# Patient Record
Sex: Female | Born: 1950 | Race: White | Hispanic: No | Marital: Married | Smoking: Never smoker
Health system: Southern US, Community
[De-identification: ages and names within clinical notes are randomized; demographics above are authoritative.]

## PROBLEM LIST (undated history)

## (undated) DIAGNOSIS — Z8042 Family history of malignant neoplasm of prostate: Secondary | ICD-10-CM

## (undated) DIAGNOSIS — J8482 Adult pulmonary Langerhans cell histiocytosis: Secondary | ICD-10-CM

## (undated) DIAGNOSIS — Z803 Family history of malignant neoplasm of breast: Secondary | ICD-10-CM

## (undated) DIAGNOSIS — E039 Hypothyroidism, unspecified: Secondary | ICD-10-CM

## (undated) DIAGNOSIS — I5189 Other ill-defined heart diseases: Secondary | ICD-10-CM

## (undated) DIAGNOSIS — Z982 Presence of cerebrospinal fluid drainage device: Secondary | ICD-10-CM

## (undated) DIAGNOSIS — C519 Malignant neoplasm of vulva, unspecified: Secondary | ICD-10-CM

## (undated) DIAGNOSIS — J309 Allergic rhinitis, unspecified: Secondary | ICD-10-CM

## (undated) DIAGNOSIS — I10 Essential (primary) hypertension: Secondary | ICD-10-CM

## (undated) DIAGNOSIS — K209 Esophagitis, unspecified without bleeding: Secondary | ICD-10-CM

## (undated) DIAGNOSIS — C4499 Other specified malignant neoplasm of skin, unspecified: Secondary | ICD-10-CM

## (undated) DIAGNOSIS — K649 Unspecified hemorrhoids: Secondary | ICD-10-CM

## (undated) DIAGNOSIS — D649 Anemia, unspecified: Secondary | ICD-10-CM

## (undated) DIAGNOSIS — T451X5A Adverse effect of antineoplastic and immunosuppressive drugs, initial encounter: Secondary | ICD-10-CM

## (undated) DIAGNOSIS — Z79811 Long term (current) use of aromatase inhibitors: Secondary | ICD-10-CM

## (undated) DIAGNOSIS — R7303 Prediabetes: Secondary | ICD-10-CM

## (undated) DIAGNOSIS — Z9484 Stem cells transplant status: Secondary | ICD-10-CM

## (undated) DIAGNOSIS — Z808 Family history of malignant neoplasm of other organs or systems: Secondary | ICD-10-CM

## (undated) DIAGNOSIS — M1990 Unspecified osteoarthritis, unspecified site: Secondary | ICD-10-CM

## (undated) DIAGNOSIS — D126 Benign neoplasm of colon, unspecified: Secondary | ICD-10-CM

## (undated) DIAGNOSIS — I509 Heart failure, unspecified: Secondary | ICD-10-CM

## (undated) DIAGNOSIS — L309 Dermatitis, unspecified: Secondary | ICD-10-CM

## (undated) DIAGNOSIS — G459 Transient cerebral ischemic attack, unspecified: Secondary | ICD-10-CM

## (undated) DIAGNOSIS — R55 Syncope and collapse: Secondary | ICD-10-CM

## (undated) DIAGNOSIS — T8859XA Other complications of anesthesia, initial encounter: Secondary | ICD-10-CM

## (undated) DIAGNOSIS — I639 Cerebral infarction, unspecified: Secondary | ICD-10-CM

## (undated) DIAGNOSIS — D6481 Anemia due to antineoplastic chemotherapy: Secondary | ICD-10-CM

## (undated) DIAGNOSIS — Z9289 Personal history of other medical treatment: Secondary | ICD-10-CM

## (undated) DIAGNOSIS — C8330 Diffuse large B-cell lymphoma, unspecified site: Secondary | ICD-10-CM

## (undated) DIAGNOSIS — K5790 Diverticulosis of intestine, part unspecified, without perforation or abscess without bleeding: Secondary | ICD-10-CM

## (undated) DIAGNOSIS — Z923 Personal history of irradiation: Secondary | ICD-10-CM

## (undated) DIAGNOSIS — Z9221 Personal history of antineoplastic chemotherapy: Secondary | ICD-10-CM

## (undated) DIAGNOSIS — K219 Gastro-esophageal reflux disease without esophagitis: Secondary | ICD-10-CM

## (undated) DIAGNOSIS — E7800 Pure hypercholesterolemia, unspecified: Secondary | ICD-10-CM

## (undated) HISTORY — DX: Unspecified hemorrhoids: K64.9

## (undated) HISTORY — DX: Diverticulosis of intestine, part unspecified, without perforation or abscess without bleeding: K57.90

## (undated) HISTORY — DX: Personal history of other medical treatment: Z92.89

## (undated) HISTORY — PX: VULVECTOMY: SHX1086

## (undated) HISTORY — PX: LIVER BIOPSY: SHX301

## (undated) HISTORY — DX: Family history of malignant neoplasm of breast: Z80.3

## (undated) HISTORY — DX: Personal history of antineoplastic chemotherapy: Z92.21

## (undated) HISTORY — DX: Family history of malignant neoplasm of other organs or systems: Z80.8

## (undated) HISTORY — DX: Family history of malignant neoplasm of prostate: Z80.42

## (undated) HISTORY — DX: Stem cells transplant status: Z94.84

## (undated) HISTORY — DX: Essential (primary) hypertension: I10

## (undated) HISTORY — DX: Esophagitis, unspecified: K20.9

## (undated) HISTORY — DX: Adult pulmonary Langerhans cell histiocytosis: J84.82

## (undated) HISTORY — DX: Unspecified osteoarthritis, unspecified site: M19.90

## (undated) HISTORY — DX: Pure hypercholesterolemia, unspecified: E78.00

## (undated) HISTORY — PX: BURR HOLE W/ PLACEMENT OMMAYA RESERVOIR: SHX1277

## (undated) HISTORY — DX: Other specified malignant neoplasm of skin, unspecified: C44.99

## (undated) HISTORY — DX: Hypothyroidism, unspecified: E03.9

## (undated) HISTORY — DX: Allergic rhinitis, unspecified: J30.9

## (undated) HISTORY — DX: Other ill-defined heart diseases: I51.89

## (undated) HISTORY — DX: Transient cerebral ischemic attack, unspecified: G45.9

## (undated) HISTORY — DX: Morbid (severe) obesity due to excess calories: E66.01

## (undated) HISTORY — DX: Syncope and collapse: R55

## (undated) HISTORY — DX: Diffuse large B-cell lymphoma, unspecified site: C83.30

## (undated) HISTORY — DX: Esophagitis, unspecified without bleeding: K20.90

## (undated) HISTORY — DX: Malignant neoplasm of vulva, unspecified: C51.9

---
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
## 1983-12-05 HISTORY — PX: ABDOMINAL HYSTERECTOMY: SHX81

## 2000-12-04 HISTORY — PX: BREAST BIOPSY: SHX20

## 2004-03-25 ENCOUNTER — Other Ambulatory Visit: Payer: Self-pay

## 2008-12-22 ENCOUNTER — Ambulatory Visit: Payer: Self-pay | Admitting: Gastroenterology

## 2009-01-29 ENCOUNTER — Ambulatory Visit: Payer: Self-pay | Admitting: Internal Medicine

## 2009-05-21 LAB — HM COLONOSCOPY

## 2009-12-04 HISTORY — PX: INSERTION CENTRAL VENOUS ACCESS DEVICE W/ SUBCUTANEOUS PORT: SUR725

## 2009-12-04 LAB — HM PAP SMEAR

## 2010-05-19 DIAGNOSIS — Z9289 Personal history of other medical treatment: Secondary | ICD-10-CM

## 2010-05-19 HISTORY — DX: Personal history of other medical treatment: Z92.89

## 2010-10-04 ENCOUNTER — Ambulatory Visit: Payer: Self-pay | Admitting: Oncology

## 2010-10-04 DIAGNOSIS — C8330 Diffuse large B-cell lymphoma, unspecified site: Secondary | ICD-10-CM

## 2010-10-04 HISTORY — DX: Diffuse large B-cell lymphoma, unspecified site: C83.30

## 2010-10-13 ENCOUNTER — Inpatient Hospital Stay: Payer: Self-pay | Admitting: Internal Medicine

## 2010-10-22 DIAGNOSIS — Z9221 Personal history of antineoplastic chemotherapy: Secondary | ICD-10-CM

## 2010-10-22 HISTORY — DX: Personal history of antineoplastic chemotherapy: Z92.21

## 2010-10-25 LAB — PATHOLOGY REPORT

## 2010-11-03 ENCOUNTER — Ambulatory Visit: Payer: Self-pay | Admitting: Oncology

## 2010-11-08 ENCOUNTER — Ambulatory Visit: Payer: Self-pay | Admitting: Internal Medicine

## 2010-11-24 ENCOUNTER — Inpatient Hospital Stay: Payer: Self-pay | Admitting: Internal Medicine

## 2010-12-04 ENCOUNTER — Ambulatory Visit: Payer: Self-pay | Admitting: Internal Medicine

## 2010-12-04 ENCOUNTER — Ambulatory Visit: Payer: Self-pay | Admitting: Oncology

## 2010-12-04 DIAGNOSIS — Z982 Presence of cerebrospinal fluid drainage device: Secondary | ICD-10-CM

## 2010-12-04 HISTORY — PX: LIMBAL STEM CELL TRANSPLANT: SHX1969

## 2010-12-04 HISTORY — DX: Presence of cerebrospinal fluid drainage device: Z98.2

## 2010-12-04 HISTORY — PX: BURR HOLE W/ PLACEMENT OMMAYA RESERVOIR: SHX1277

## 2010-12-22 ENCOUNTER — Observation Stay: Payer: Self-pay | Admitting: Internal Medicine

## 2011-01-01 ENCOUNTER — Observation Stay: Payer: Self-pay | Admitting: Internal Medicine

## 2011-01-04 ENCOUNTER — Ambulatory Visit: Payer: Self-pay | Admitting: Oncology

## 2011-01-04 ENCOUNTER — Ambulatory Visit: Payer: Self-pay | Admitting: Internal Medicine

## 2011-01-20 ENCOUNTER — Inpatient Hospital Stay: Payer: Self-pay | Admitting: Internal Medicine

## 2011-02-01 ENCOUNTER — Ambulatory Visit: Payer: Self-pay | Admitting: Vascular Surgery

## 2011-02-02 ENCOUNTER — Ambulatory Visit: Payer: Self-pay | Admitting: Oncology

## 2011-02-02 ENCOUNTER — Ambulatory Visit: Payer: Self-pay | Admitting: Internal Medicine

## 2011-02-17 ENCOUNTER — Other Ambulatory Visit: Payer: Self-pay | Admitting: Internal Medicine

## 2011-03-03 ENCOUNTER — Inpatient Hospital Stay: Payer: Self-pay | Admitting: Internal Medicine

## 2011-03-05 ENCOUNTER — Ambulatory Visit: Payer: Self-pay | Admitting: Internal Medicine

## 2011-03-05 ENCOUNTER — Ambulatory Visit: Payer: Self-pay | Admitting: Oncology

## 2011-04-04 ENCOUNTER — Ambulatory Visit: Payer: Self-pay | Admitting: Internal Medicine

## 2011-04-04 ENCOUNTER — Ambulatory Visit: Payer: Self-pay | Admitting: Oncology

## 2011-05-05 ENCOUNTER — Ambulatory Visit: Payer: Self-pay | Admitting: Internal Medicine

## 2011-05-05 ENCOUNTER — Ambulatory Visit: Payer: Self-pay | Admitting: Oncology

## 2011-06-04 DIAGNOSIS — Z9484 Stem cells transplant status: Secondary | ICD-10-CM

## 2011-06-04 HISTORY — DX: Stem cells transplant status: Z94.84

## 2011-06-08 ENCOUNTER — Ambulatory Visit: Payer: Self-pay | Admitting: Internal Medicine

## 2011-06-26 LAB — PULMONARY FUNCTION TEST

## 2011-07-05 ENCOUNTER — Ambulatory Visit: Payer: Self-pay | Admitting: Internal Medicine

## 2011-08-05 ENCOUNTER — Ambulatory Visit: Payer: Self-pay | Admitting: Internal Medicine

## 2011-08-10 ENCOUNTER — Inpatient Hospital Stay: Payer: Self-pay | Admitting: Internal Medicine

## 2011-09-04 ENCOUNTER — Ambulatory Visit: Payer: Self-pay | Admitting: Internal Medicine

## 2011-10-05 ENCOUNTER — Ambulatory Visit: Payer: Self-pay | Admitting: Internal Medicine

## 2011-11-04 ENCOUNTER — Ambulatory Visit: Payer: Self-pay | Admitting: Internal Medicine

## 2011-12-05 ENCOUNTER — Ambulatory Visit: Payer: Self-pay | Admitting: Internal Medicine

## 2011-12-13 ENCOUNTER — Ambulatory Visit: Payer: Self-pay | Admitting: Internal Medicine

## 2011-12-13 LAB — HEPATIC FUNCTION PANEL A (ARMC)
Bilirubin, Direct: 0.1 mg/dL (ref 0.00–0.20)
Bilirubin,Total: 0.3 mg/dL (ref 0.2–1.0)
SGOT(AST): 23 U/L (ref 15–37)
Total Protein: 6.4 g/dL (ref 6.4–8.2)

## 2011-12-27 LAB — CBC CANCER CENTER
Basophil #: 0 x10 3/mm (ref 0.0–0.1)
Basophil %: 0.3 %
HGB: 13.2 g/dL (ref 12.0–16.0)
Lymphocyte #: 0.8 x10 3/mm — ABNORMAL LOW (ref 1.0–3.6)
Lymphocyte %: 24.1 %
MCHC: 34.6 g/dL (ref 32.0–36.0)
MCV: 96 fL (ref 80–100)
Monocyte %: 5.1 %
Neutrophil %: 69.9 %
RBC: 3.97 10*6/uL (ref 3.80–5.20)
RDW: 13.9 % (ref 11.5–14.5)
WBC: 3.4 x10 3/mm — ABNORMAL LOW (ref 3.6–11.0)

## 2011-12-27 LAB — LACTATE DEHYDROGENASE: LDH: 199 U/L (ref 84–246)

## 2011-12-27 LAB — ALKALINE PHOSPHATASE: Alkaline Phosphatase: 135 U/L (ref 50–136)

## 2012-01-05 ENCOUNTER — Ambulatory Visit: Payer: Self-pay | Admitting: Internal Medicine

## 2012-01-24 LAB — CBC CANCER CENTER
Basophil #: 0 x10 3/mm (ref 0.0–0.1)
Eosinophil %: 0.6 %
HCT: 39.2 % (ref 35.0–47.0)
HGB: 13.4 g/dL (ref 12.0–16.0)
Lymphocyte #: 0.9 x10 3/mm — ABNORMAL LOW (ref 1.0–3.6)
Lymphocyte %: 23.6 %
MCH: 32.6 pg (ref 26.0–34.0)
MCV: 95 fL (ref 80–100)
Monocyte #: 0.2 x10 3/mm (ref 0.0–0.7)
Neutrophil #: 2.7 x10 3/mm (ref 1.4–6.5)
Neutrophil %: 70.2 %
Platelet: 134 x10 3/mm — ABNORMAL LOW (ref 150–440)
RBC: 4.11 10*6/uL (ref 3.80–5.20)
RDW: 14.3 % (ref 11.5–14.5)

## 2012-01-24 LAB — HEPATIC FUNCTION PANEL A (ARMC)
Albumin: 3.2 g/dL — ABNORMAL LOW (ref 3.4–5.0)
Alkaline Phosphatase: 136 U/L (ref 50–136)
Bilirubin, Direct: 0 mg/dL (ref 0.00–0.20)
Bilirubin,Total: 0.3 mg/dL (ref 0.2–1.0)
SGOT(AST): 23 U/L (ref 15–37)
SGPT (ALT): 30 U/L

## 2012-01-24 LAB — CREATININE, SERUM: Creatinine: 0.97 mg/dL (ref 0.60–1.30)

## 2012-01-24 LAB — URIC ACID: Uric Acid: 2.8 mg/dL (ref 2.6–6.0)

## 2012-01-24 LAB — LACTATE DEHYDROGENASE: LDH: 221 U/L (ref 84–246)

## 2012-01-24 LAB — MAGNESIUM: Magnesium: 1.9 mg/dL

## 2012-02-02 ENCOUNTER — Ambulatory Visit: Payer: Self-pay | Admitting: Internal Medicine

## 2012-02-20 ENCOUNTER — Ambulatory Visit (INDEPENDENT_AMBULATORY_CARE_PROVIDER_SITE_OTHER): Payer: Self-pay | Admitting: Internal Medicine

## 2012-02-20 ENCOUNTER — Encounter: Payer: Self-pay | Admitting: Internal Medicine

## 2012-02-20 VITALS — BP 130/80 | HR 79 | Temp 98.3°F | Ht 62.5 in | Wt 192.0 lb

## 2012-02-20 DIAGNOSIS — Z8572 Personal history of non-Hodgkin lymphomas: Secondary | ICD-10-CM

## 2012-02-20 DIAGNOSIS — E785 Hyperlipidemia, unspecified: Secondary | ICD-10-CM

## 2012-02-20 DIAGNOSIS — E669 Obesity, unspecified: Secondary | ICD-10-CM | POA: Insufficient documentation

## 2012-02-20 DIAGNOSIS — E663 Overweight: Secondary | ICD-10-CM

## 2012-02-20 DIAGNOSIS — Z87898 Personal history of other specified conditions: Secondary | ICD-10-CM

## 2012-02-20 DIAGNOSIS — E039 Hypothyroidism, unspecified: Secondary | ICD-10-CM

## 2012-02-20 NOTE — Assessment & Plan Note (Signed)
BMI 34. Encouraged keeping a food diary. Gave some references for healthy diet including the book Prevent and Reverse Heart Disease. Encouraged exercise with goal of 30 minutes most days of the week. Followup in 3 months.

## 2012-02-20 NOTE — Patient Instructions (Addendum)
Prevent and Reverse Heart Disease by [email or web address]

## 2012-02-20 NOTE — Assessment & Plan Note (Signed)
Will get records on recent TSH. Will continue Synthroid.

## 2012-02-20 NOTE — Progress Notes (Signed)
Subjective:    Patient ID: Bailey Brooks, female    DOB: 05-19-1951, 61 y.o.   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 She reports that generally she is feeling well. Her primary concern today is weight gain. She notes that she lost nearly 100 pounds with chemotherapy and stem cell transplantation, however she has started to gain weight back and has gained approximately 25 pounds. She has recently joined a wellness program at her employer. She is trying to limit intake of high sugar and high-fat foods. She is trying to increase her physical activity. She notes that recent check of her cholesterol showed elevated lipids. She notes that her total cholesterol is over 200. Aside from this, she reports that she is feeling well. She notes that she has regular followup with her local oncologist and is planning to see him tomorrow.  Outpatient Encounter Prescriptions as of 02/20/2012  Medication Sig Dispense Refill  . Cholecalciferol (VITAMIN D) 2000 UNITS tablet Take 2,000 Units by mouth 2 (two) times daily.      . lansoprazole (PREVACID) 15 MG capsule Take 15 mg by mouth daily.      Marland Kitchen levothyroxine (SYNTHROID, LEVOTHROID) 50 MCG tablet Take 50 mcg by mouth daily.      Marland Kitchen OVER THE COUNTER MEDICATION 3 (three) times daily. SLOW MAG Calcium 238 mg  Magnesium 143 mg  Chloride 405 mg Sodium 5 mg      . potassium chloride (K-DUR) 10 MEQ tablet Take 10 mEq by mouth 2 (two) times daily.      . valACYclovir (VALTREX) 500 MG tablet Take 500 mg by mouth 2 (two) times daily.        Review of Systems  Constitutional: Negative for fever, chills, appetite change, fatigue and unexpected weight change.  HENT: Negative for ear pain, congestion, sore throat, trouble swallowing, neck pain, voice change and sinus pressure.   Eyes: Negative for visual disturbance.  Respiratory: Negative for cough, shortness  of breath, wheezing and stridor.   Cardiovascular: Negative for chest pain, palpitations and leg swelling.  Gastrointestinal: Negative for nausea, vomiting, abdominal pain, diarrhea, constipation, blood in stool, abdominal distention and anal bleeding.  Genitourinary: Negative for dysuria and flank pain.  Musculoskeletal: Negative for myalgias, arthralgias and gait problem.  Skin: Negative for color change and rash.  Neurological: Negative for dizziness and headaches.  Hematological: Negative for adenopathy. Does not bruise/bleed easily.  Psychiatric/Behavioral: Negative for suicidal ideas, sleep disturbance and dysphoric mood. The patient is not nervous/anxious.    BP 130/80  Pulse 79  Temp(Src) 98.3 F (36.8 C) (Oral)  Ht 5' 2.5" (1.588 m)  Wt 192 lb (87.091 kg)  BMI 34.56 kg/m2  SpO2 99%     Objective:   Physical Exam  Constitutional: She is oriented to person, place, and time. She appears well-developed and well-nourished. No distress.  HENT:  Head: Normocephalic and atraumatic.  Right Ear: External ear normal.  Left Ear: External ear normal.  Nose: Nose normal.  Mouth/Throat: Oropharynx is clear and moist. No oropharyngeal exudate.  Eyes: Conjunctivae are normal. Pupils are equal, round, and reactive to light. Right eye exhibits no discharge. Left eye exhibits no discharge. No scleral icterus.  Neck: Normal range of motion. Neck supple. No tracheal deviation present. No thyromegaly present.  Cardiovascular: Normal rate, regular rhythm, normal heart sounds and intact distal pulses.  Exam reveals no gallop and no friction  rub.   No murmur heard. Pulmonary/Chest: Effort normal and breath sounds normal. No respiratory distress. She has no wheezes. She has no rales. She exhibits no tenderness.  Abdominal: Soft. Bowel sounds are normal. She exhibits no distension and no mass. There is no tenderness. There is no rebound and no guarding.  Musculoskeletal: Normal range of motion. She  exhibits no edema and no tenderness.  Lymphadenopathy:    She has no cervical adenopathy.  Neurological: She is alert and oriented to person, place, and time. No cranial nerve deficit. She exhibits normal muscle tone. Coordination normal.  Skin: Skin is warm and dry. No rash noted. She is not diaphoretic. No erythema. No pallor.  Psychiatric: She has a normal mood and affect. Her behavior is normal. Judgment and thought content normal.          Assessment & Plan:

## 2012-02-20 NOTE — Assessment & Plan Note (Signed)
Will get records on recent lipid profile. As above, encouraged healthier diet and exercise. Patient is part of a wellness program through her employer. Will followup in 3 months.

## 2012-02-20 NOTE — Assessment & Plan Note (Signed)
Will get records from oncologist.

## 2012-02-21 LAB — CBC CANCER CENTER
Basophil #: 0 x10 3/mm (ref 0.0–0.1)
Basophil %: 0.4 %
Eosinophil #: 0 x10 3/mm (ref 0.0–0.7)
Eosinophil %: 0.9 %
HGB: 13.4 g/dL (ref 12.0–16.0)
MCH: 32.6 pg (ref 26.0–34.0)
Monocyte #: 0.3 x10 3/mm (ref 0.0–0.7)
Neutrophil %: 61.7 %
Platelet: 147 x10 3/mm — ABNORMAL LOW (ref 150–440)
RBC: 4.1 10*6/uL (ref 3.80–5.20)
RDW: 14.4 % (ref 11.5–14.5)

## 2012-02-21 LAB — HEPATIC FUNCTION PANEL A (ARMC)
Albumin: 3.1 g/dL — ABNORMAL LOW (ref 3.4–5.0)
Alkaline Phosphatase: 137 U/L — ABNORMAL HIGH (ref 50–136)
Bilirubin, Direct: 0.2 mg/dL (ref 0.00–0.20)
SGOT(AST): 27 U/L (ref 15–37)
SGPT (ALT): 29 U/L

## 2012-02-21 LAB — CREATININE, SERUM
Creatinine: 0.98 mg/dL (ref 0.60–1.30)
EGFR (African American): >60
EGFR (Non-African Amer.): >60

## 2012-02-28 ENCOUNTER — Telehealth: Payer: Self-pay | Admitting: Internal Medicine

## 2012-02-28 NOTE — Telephone Encounter (Signed)
I received labs from 01/2012 which showed elevated cholesterol. I would like to repeat CMP and lipids prior to next visit.

## 2012-02-29 NOTE — Telephone Encounter (Signed)
Patient informed, I scheduled her for lab apt prior to f/u in June

## 2012-03-04 ENCOUNTER — Ambulatory Visit: Payer: Self-pay | Admitting: Internal Medicine

## 2012-03-20 LAB — HEPATIC FUNCTION PANEL A (ARMC): Albumin: 3.3 g/dL — ABNORMAL LOW (ref 3.4–5.0)

## 2012-03-20 LAB — LACTATE DEHYDROGENASE: LDH: 161 U/L (ref 84–246)

## 2012-04-03 ENCOUNTER — Ambulatory Visit: Payer: Self-pay | Admitting: Internal Medicine

## 2012-04-17 LAB — CBC CANCER CENTER
Eosinophil #: 0.1 x10 3/mm (ref 0.0–0.7)
Eosinophil %: 1.5 %
HGB: 13.2 g/dL (ref 12.0–16.0)
Lymphocyte #: 0.8 x10 3/mm — ABNORMAL LOW (ref 1.0–3.6)
MCH: 31.8 pg (ref 26.0–34.0)
Neutrophil #: 2.3 x10 3/mm (ref 1.4–6.5)
Neutrophil %: 65.5 %
Platelet: 123 x10 3/mm — ABNORMAL LOW (ref 150–440)
RDW: 14.3 % (ref 11.5–14.5)

## 2012-04-17 LAB — COMPREHENSIVE METABOLIC PANEL
Albumin: 3.1 g/dL — ABNORMAL LOW (ref 3.4–5.0)
Alkaline Phosphatase: 138 U/L — ABNORMAL HIGH (ref 50–136)
Anion Gap: 5 — ABNORMAL LOW (ref 7–16)
Calcium, Total: 8.8 mg/dL (ref 8.5–10.1)
Chloride: 106 mmol/L (ref 98–107)
Creatinine: 0.86 mg/dL (ref 0.60–1.30)
EGFR (Non-African Amer.): >60
Osmolality: 284 (ref 275–301)
SGOT(AST): 28 U/L (ref 15–37)
SGPT (ALT): 26 U/L
Total Protein: 6.2 g/dL — ABNORMAL LOW (ref 6.4–8.2)

## 2012-04-23 ENCOUNTER — Ambulatory Visit: Payer: Self-pay | Admitting: Internal Medicine

## 2012-05-04 ENCOUNTER — Ambulatory Visit: Payer: Self-pay | Admitting: Internal Medicine

## 2012-05-17 ENCOUNTER — Other Ambulatory Visit (INDEPENDENT_AMBULATORY_CARE_PROVIDER_SITE_OTHER): Payer: BC Managed Care – PPO | Admitting: *Deleted

## 2012-05-17 DIAGNOSIS — Z79899 Other long term (current) drug therapy: Secondary | ICD-10-CM

## 2012-05-17 DIAGNOSIS — E785 Hyperlipidemia, unspecified: Secondary | ICD-10-CM

## 2012-05-17 LAB — LIPID PANEL
HDL: 45.2 mg/dL (ref >39.00)
Total CHOL/HDL Ratio: 5
VLDL: 29.6 mg/dL (ref 0.0–40.0)

## 2012-05-17 LAB — COMPREHENSIVE METABOLIC PANEL
ALT: 16 U/L (ref 0–35)
Alkaline Phosphatase: 106 U/L (ref 39–117)
CO2: 28 mEq/L (ref 19–32)
Creatinine, Ser: 0.8 mg/dL (ref 0.4–1.2)
GFR: 73.96 mL/min (ref >60.00)
Total Bilirubin: 0.5 mg/dL (ref 0.3–1.2)

## 2012-05-21 ENCOUNTER — Encounter: Payer: Self-pay | Admitting: Internal Medicine

## 2012-05-21 ENCOUNTER — Ambulatory Visit (INDEPENDENT_AMBULATORY_CARE_PROVIDER_SITE_OTHER): Payer: BC Managed Care – PPO | Admitting: Internal Medicine

## 2012-05-21 VITALS — BP 122/80 | HR 87 | Temp 99.5°F | Ht 62.5 in | Wt 202.2 lb

## 2012-05-21 DIAGNOSIS — E785 Hyperlipidemia, unspecified: Secondary | ICD-10-CM

## 2012-05-21 DIAGNOSIS — E663 Overweight: Secondary | ICD-10-CM

## 2012-05-21 DIAGNOSIS — J0100 Acute maxillary sinusitis, unspecified: Secondary | ICD-10-CM

## 2012-05-21 MED ORDER — AMOXICILLIN-POT CLAVULANATE 875-125 MG PO TABS: 1.0000 | ORAL_TABLET | Freq: Two times a day (BID) | ORAL | Status: AC

## 2012-05-21 NOTE — Assessment & Plan Note (Signed)
Symptoms and exam are consistent with maxillary sinusitis. Will treat with Augmentin. Patient will also start Allegra. She will use ibuprofen as needed for pain. She will followup if symptoms are not improving within the next 72 hours.

## 2012-05-21 NOTE — Assessment & Plan Note (Signed)
Marked improvement in cholesterol with diet and exercise intervention. Will continue to monitor. Plan to repeat cholesterol panel in 6 months.

## 2012-05-21 NOTE — Assessment & Plan Note (Signed)
Encouraged patient to continue efforts at healthy diet and regular physical activity. Followup in 6 months.

## 2012-05-21 NOTE — Progress Notes (Signed)
Subjective:    Patient ID: Bailey Brooks, female    DOB: 07/25/1951, 61 y.o.   MRN: [id number]  HPI 61 year old female with history of lymphoma, hypothyroidism, hyperlipidemia presents for followup. Recent cholesterol values on labs show marked improvement with nearly 50 point decrease in total cholesterol and 40 point decrease in LDL cholesterol over the last 3 months. She notes improvement in her diet with increased intake of fiber and decrease saturated fat. She is also participating in an exercise program through her work. She reports she is generally feeling well. She notes that she was recently seen by her oncologist and had PET scan which showed no recurrence of disease.  Over the last 3 or 4 days she has noted some increased sinus drainage, sinus pressure, headache pain, and fever. She denies any shortness of breath. She occasionally has cough productive of purulent sputum. She has not been taking any medication for this.  Outpatient Encounter Prescriptions as of 05/21/2012  Medication Sig Dispense Refill  . Cholecalciferol (VITAMIN D) 2000 UNITS tablet Take 2,000 Units by mouth 2 (two) times daily.      . lansoprazole (PREVACID) 15 MG capsule Take 15 mg by mouth daily.      Marland Kitchen levothyroxine (SYNTHROID, LEVOTHROID) 50 MCG tablet Take 50 mcg by mouth daily.      . potassium chloride (K-DUR) 10 MEQ tablet Take 10 mEq by mouth 2 (two) times daily.      . valACYclovir (VALTREX) 500 MG tablet Take 500 mg by mouth 2 (two) times daily.      Marland Kitchen amoxicillin-clavulanate (AUGMENTIN) 875-125 MG per tablet Take 1 tablet by mouth 2 (two) times daily.  20 tablet  0  . OVER THE COUNTER MEDICATION 3 (three) times daily. SLOW MAG Calcium 238 mg  Chloride 405 mg Sodium 5 mg        Review of Systems  Constitutional: Positive for fever. Negative for chills, appetite change, fatigue and unexpected weight change.  HENT: Positive for ear pain, congestion, rhinorrhea and postnasal drip. Negative for sore  throat, trouble swallowing, neck pain, voice change and sinus pressure.   Eyes: Negative for visual disturbance.  Respiratory: Positive for cough. Negative for shortness of breath, wheezing and stridor.   Cardiovascular: Negative for chest pain, palpitations and leg swelling.  Gastrointestinal: Negative for nausea, vomiting, abdominal pain, diarrhea, constipation, blood in stool, abdominal distention and anal bleeding.  Genitourinary: Negative for dysuria and flank pain.  Musculoskeletal: Negative for myalgias, arthralgias and gait problem.  Skin: Negative for color change and rash.  Neurological: Positive for headaches. Negative for dizziness.  Hematological: Negative for adenopathy. Does not bruise/bleed easily.  Psychiatric/Behavioral: Negative for suicidal ideas, disturbed wake/sleep cycle and dysphoric mood. The patient is not nervous/anxious.    BP 122/80  Pulse 87  Temp 99.5 F (37.5 C) (Oral)  Ht 5' 2.5" (1.588 m)  Wt 202 lb 4 oz (91.74 kg)  BMI 36.40 kg/m2  SpO2 98%     Objective:   Physical Exam  Constitutional: She is oriented to person, place, and time. She appears well-developed and well-nourished. No distress.  HENT:  Head: Normocephalic and atraumatic.  Right Ear: External ear normal.  Left Ear: External ear normal.  Nose: Mucosal edema and rhinorrhea present.  Mouth/Throat: Oropharynx is clear and moist. No oropharyngeal exudate.  Eyes: Conjunctivae are normal. Pupils are equal, round, and reactive to light. Right eye exhibits no discharge. Left eye exhibits no discharge. No scleral icterus.  Neck: Normal range of motion. Neck  supple. No tracheal deviation present. No thyromegaly present.  Cardiovascular: Normal rate, regular rhythm, normal heart sounds and intact distal pulses.  Exam reveals no gallop and no friction rub.   No murmur heard. Pulmonary/Chest: Effort normal and breath sounds normal. No respiratory distress. She has no wheezes. She has no rales. She  exhibits no tenderness.  Musculoskeletal: Normal range of motion. She exhibits no edema and no tenderness.  Lymphadenopathy:    She has no cervical adenopathy.  Neurological: She is alert and oriented to person, place, and time. No cranial nerve deficit. She exhibits normal muscle tone. Coordination normal.  Skin: Skin is warm and dry. No rash noted. She is not diaphoretic. No erythema. No pallor.  Psychiatric: She has a normal mood and affect. Her behavior is normal. Judgment and thought content normal.          Assessment & Plan:

## 2012-07-26 ENCOUNTER — Ambulatory Visit: Payer: Self-pay | Admitting: Internal Medicine

## 2012-07-26 LAB — COMPREHENSIVE METABOLIC PANEL
Alkaline Phosphatase: 123 U/L (ref 50–136)
Anion Gap: 1 — ABNORMAL LOW (ref 7–16)
Bilirubin,Total: 0.3 mg/dL (ref 0.2–1.0)
Calcium, Total: 9.2 mg/dL (ref 8.5–10.1)
Chloride: 107 mmol/L (ref 98–107)
Co2: 32 mmol/L (ref 21–32)
Creatinine: 0.96 mg/dL (ref 0.60–1.30)
EGFR (Non-African Amer.): >60
Osmolality: 282 (ref 275–301)
Potassium: 4.4 mmol/L (ref 3.5–5.1)
SGOT(AST): 20 U/L (ref 15–37)
Sodium: 140 mmol/L (ref 136–145)
Total Protein: 6.5 g/dL (ref 6.4–8.2)

## 2012-07-26 LAB — CBC CANCER CENTER
Basophil #: 0 x10 3/mm (ref 0.0–0.1)
Eosinophil #: 0.1 x10 3/mm (ref 0.0–0.7)
Eosinophil %: 1.8 %
Lymphocyte #: 1.1 x10 3/mm (ref 1.0–3.6)
MCH: 31.7 pg (ref 26.0–34.0)
MCHC: 34 g/dL (ref 32.0–36.0)
MCV: 93 fL (ref 80–100)
Neutrophil #: 3.1 x10 3/mm (ref 1.4–6.5)
Neutrophil %: 64.4 %
Platelet: 162 x10 3/mm (ref 150–440)

## 2012-07-26 LAB — LACTATE DEHYDROGENASE: LDH: 148 U/L (ref 81–234)

## 2012-08-04 ENCOUNTER — Ambulatory Visit: Payer: Self-pay | Admitting: Internal Medicine

## 2012-08-27 ENCOUNTER — Ambulatory Visit: Payer: Self-pay | Admitting: Internal Medicine

## 2012-09-30 ENCOUNTER — Ambulatory Visit: Payer: Self-pay | Admitting: Internal Medicine

## 2012-09-30 LAB — CBC CANCER CENTER
Basophil #: 0 x10 3/mm (ref 0.0–0.1)
Basophil %: 0.2 %
Eosinophil %: 1.2 %
HGB: 14.2 g/dL (ref 12.0–16.0)
Lymphocyte #: 0.9 x10 3/mm — ABNORMAL LOW (ref 1.0–3.6)
Lymphocyte %: 27.7 %
MCHC: 32.7 g/dL (ref 32.0–36.0)
Monocyte #: 0.4 x10 3/mm (ref 0.2–0.9)
Neutrophil #: 1.8 x10 3/mm (ref 1.4–6.5)
Neutrophil %: 58.7 %
Platelet: 126 x10 3/mm — ABNORMAL LOW (ref 150–440)
RBC: 4.59 10*6/uL (ref 3.80–5.20)

## 2012-09-30 LAB — MAGNESIUM: Magnesium: 2 mg/dL

## 2012-09-30 LAB — LACTATE DEHYDROGENASE: LDH: 165 U/L (ref 81–246)

## 2012-09-30 LAB — POTASSIUM: Potassium: 3.9 mmol/L (ref 3.5–5.1)

## 2012-10-04 ENCOUNTER — Ambulatory Visit: Payer: Self-pay | Admitting: Internal Medicine

## 2012-10-11 LAB — CBC CANCER CENTER
Basophil %: 0.5 %
Eosinophil %: 0.7 %
HGB: 13.8 g/dL (ref 12.0–16.0)
Lymphocyte #: 0.9 x10 3/mm — ABNORMAL LOW (ref 1.0–3.6)
MCH: 30.7 pg (ref 26.0–34.0)
Monocyte #: 0.4 x10 3/mm (ref 0.2–0.9)
Monocyte %: 6.1 %
Neutrophil #: 4.7 x10 3/mm (ref 1.4–6.5)
Neutrophil %: 77.5 %
RBC: 4.48 10*6/uL (ref 3.80–5.20)
RDW: 14.3 % (ref 11.5–14.5)
WBC: 6.1 x10 3/mm (ref 3.6–11.0)

## 2012-10-14 ENCOUNTER — Ambulatory Visit (INDEPENDENT_AMBULATORY_CARE_PROVIDER_SITE_OTHER): Payer: BC Managed Care – PPO | Admitting: Internal Medicine

## 2012-10-14 ENCOUNTER — Encounter: Payer: Self-pay | Admitting: Internal Medicine

## 2012-10-14 ENCOUNTER — Telehealth: Payer: Self-pay | Admitting: Internal Medicine

## 2012-10-14 VITALS — BP 122/80 | HR 64 | Temp 98.2°F | Ht 62.5 in | Wt 220.5 lb

## 2012-10-14 DIAGNOSIS — J0110 Acute frontal sinusitis, unspecified: Secondary | ICD-10-CM

## 2012-10-14 MED ORDER — AMOXICILLIN-POT CLAVULANATE 875-125 MG PO TABS: 1.0000 | ORAL_TABLET | Freq: Two times a day (BID) | ORAL | Status: DC

## 2012-10-14 NOTE — Telephone Encounter (Signed)
Patient advised via telephone, appt scheduled for 2:00 with Dr. Dan Humphreys today.

## 2012-10-14 NOTE — Progress Notes (Signed)
Subjective:    Patient ID: Bailey Brooks, female    DOB: 10/05/1951, 61 y.o.   MRN: [id number]  HPI 61YO female with h/o lymphoma presents for acute visit c/o 2 week h/o right frontal sinus pressure/pain and headache, nasal congestion, post-nasal drip with productive cough. Denies fever, chills, dyspnea.  Not taking any medication for this except for chronic allegra.  Outpatient Prescriptions Prior to Visit  Medication Sig Dispense Refill  . Cholecalciferol (VITAMIN D) 2000 UNITS tablet Take 2,000 Units by mouth 2 (two) times daily.      . lansoprazole (PREVACID) 15 MG capsule Take 15 mg by mouth daily.      Marland Kitchen levothyroxine (SYNTHROID, LEVOTHROID) 50 MCG tablet Take 50 mcg by mouth daily.      Marland Kitchen OVER THE COUNTER MEDICATION 3 (three) times daily. SLOW MAG Calcium 238 mg  Chloride 405 mg Sodium 5 mg      . potassium chloride (K-DUR) 10 MEQ tablet Take 10 mEq by mouth 2 (two) times daily.      . valACYclovir (VALTREX) 500 MG tablet Take 500 mg by mouth 2 (two) times daily.       BP 122/80  Pulse 64  Temp 98.2 F (36.8 C) (Oral)  Ht 5' 2.5" (1.588 m)  Wt 220 lb 8 oz (100.018 kg)  BMI 39.69 kg/m2  SpO2 97%  Review of Systems  Constitutional: Negative for fever, chills and unexpected weight change.  HENT: Positive for congestion, postnasal drip and sinus pressure. Negative for hearing loss, ear pain, nosebleeds, sore throat, facial swelling, rhinorrhea, sneezing, mouth sores, trouble swallowing, neck pain, neck stiffness, voice change, tinnitus and ear discharge.   Eyes: Negative for pain, discharge, redness and visual disturbance.  Respiratory: Negative for cough, chest tightness, shortness of breath, wheezing and stridor.   Cardiovascular: Negative for chest pain, palpitations and leg swelling.  Musculoskeletal: Negative for myalgias and arthralgias.  Skin: Negative for color change and rash.  Neurological: Positive for headaches. Negative for dizziness, weakness and  light-headedness.  Hematological: Negative for adenopathy.       Objective:   Physical Exam  Constitutional: She is oriented to person, place, and time. She appears well-developed and well-nourished. No distress.  HENT:  Head: Normocephalic and atraumatic.  Right Ear: External ear normal.  Left Ear: External ear normal.  Nose: Mucosal edema present. Right sinus exhibits frontal sinus tenderness. Left sinus exhibits no frontal sinus tenderness.  Mouth/Throat: Oropharynx is clear and moist. No oropharyngeal exudate.  Eyes: Conjunctivae normal are normal. Pupils are equal, round, and reactive to light. Right eye exhibits no discharge. Left eye exhibits no discharge. No scleral icterus.  Neck: Normal range of motion. Neck supple. No tracheal deviation present. No thyromegaly present.  Cardiovascular: Normal rate, regular rhythm, normal heart sounds and intact distal pulses.  Exam reveals no gallop and no friction rub.   No murmur heard. Pulmonary/Chest: Effort normal and breath sounds normal. No respiratory distress. She has no wheezes. She has no rales. She exhibits no tenderness.  Musculoskeletal: Normal range of motion. She exhibits no edema and no tenderness.  Lymphadenopathy:    She has no cervical adenopathy.  Neurological: She is alert and oriented to person, place, and time. No cranial nerve deficit. She exhibits normal muscle tone. Coordination normal.  Skin: Skin is warm and dry. No rash noted. She is not diaphoretic. No erythema. No pallor.  Psychiatric: She has a normal mood and affect. Her behavior is normal. Judgment and thought content normal.  Assessment & Plan:

## 2012-10-14 NOTE — Telephone Encounter (Signed)
We can put her in from 2:00pm to 2:30 slot.

## 2012-10-14 NOTE — Telephone Encounter (Signed)
Patient calling, has had a sinus congestion and nasal drainage for 3 weeks.  No improvement.  Started with headaches on Friday 11/8. The headaches are not fully relieved with Tylenol.  Has has a reservoir implanted where she had lymphoma but states these headaches are different.   Needs to be seen today.  Appointments are blocked for this afternoon.  PLEASE CALL to schedule.

## 2012-10-14 NOTE — Assessment & Plan Note (Signed)
Symptoms and exam are consistent with right frontal sinusitis. Will treat with augmentin and have pt take mucinex and tylenol prn. If symptoms are persistent over next 48hr, pt will call. If symptoms persistent, would favor adding prednisone taper. Follow up in 1 month.

## 2012-11-03 ENCOUNTER — Ambulatory Visit: Payer: Self-pay | Admitting: Internal Medicine

## 2012-11-13 ENCOUNTER — Encounter: Payer: Self-pay | Admitting: Internal Medicine

## 2012-11-13 ENCOUNTER — Ambulatory Visit (INDEPENDENT_AMBULATORY_CARE_PROVIDER_SITE_OTHER): Payer: BC Managed Care – PPO | Admitting: Internal Medicine

## 2012-11-13 VITALS — BP 126/80 | HR 78 | Temp 97.9°F | Resp 16 | Wt 218.2 lb

## 2012-11-13 DIAGNOSIS — R252 Cramp and spasm: Secondary | ICD-10-CM | POA: Insufficient documentation

## 2012-11-13 DIAGNOSIS — Z1331 Encounter for screening for depression: Secondary | ICD-10-CM

## 2012-11-13 DIAGNOSIS — Z1239 Encounter for other screening for malignant neoplasm of breast: Secondary | ICD-10-CM | POA: Insufficient documentation

## 2012-11-13 HISTORY — DX: Cramp and spasm: R25.2

## 2012-11-13 NOTE — Assessment & Plan Note (Signed)
Symptoms of muscle cramps after exercise in both hands and feet. Encouraged her to drink sports beverage such as G2 with electrolytes to help with hydration before and during exercise. Recent electrolytes and thyroid function were normal on labs, however if symptoms are persistent, then will repeat electrolyte panel and TSH.

## 2012-11-13 NOTE — Progress Notes (Signed)
Subjective:    Patient ID: Bailey Brooks, female    DOB: Jan 29, 1951, 61 y.o.   MRN: [id number]  HPI 61YO female with h/o lymphoma presents for follow up. Doing well. Exercising 3-4 x per week. Notes some cramping in hands and feet after exercise. Does not drink water or other fluids consistently prior to exercise or after exercise. No cramping aside from after exercise. Otherwise feeling well. Recent electrolytes and thyroid function in 05/2012 were normal.  Outpatient Encounter Prescriptions as of 11/13/2012  Medication Sig Dispense Refill  . Cholecalciferol (VITAMIN D) 2000 UNITS tablet Take 2,000 Units by mouth 2 (two) times daily.      Marland Kitchen KLOR-CON M10 10 MEQ tablet       . lansoprazole (PREVACID) 15 MG capsule Take 15 mg by mouth daily.      Marland Kitchen levothyroxine (SYNTHROID, LEVOTHROID) 50 MCG tablet Take 50 mcg by mouth daily.      Marland Kitchen OVER THE COUNTER MEDICATION 3 (three) times daily. SLOW MAG Calcium 238 mg  Chloride 405 mg Sodium 5 mg      . potassium chloride (K-DUR) 10 MEQ tablet Take 10 mEq by mouth 2 (two) times daily.      . valACYclovir (VALTREX) 500 MG tablet Take 500 mg by mouth 2 (two) times daily.      . [DISCONTINUED] amoxicillin-clavulanate (AUGMENTIN) 875-125 MG per tablet Take 1 tablet by mouth 2 (two) times daily.  20 tablet  0   BP 126/80  Pulse 78  Temp 97.9 F (36.6 C) (Oral)  Resp 16  Wt 218 lb 4 oz (98.998 kg)  Review of Systems  Constitutional: Negative for fever, chills, appetite change, fatigue and unexpected weight change.  HENT: Negative for ear pain, congestion, sore throat, trouble swallowing, neck pain, voice change and sinus pressure.   Eyes: Negative for visual disturbance.  Respiratory: Negative for cough, shortness of breath, wheezing and stridor.   Cardiovascular: Negative for chest pain, palpitations and leg swelling.  Gastrointestinal: Negative for nausea, vomiting, abdominal pain, diarrhea, constipation, blood in stool, abdominal distention and  anal bleeding.  Genitourinary: Negative for dysuria and flank pain.  Musculoskeletal: Positive for myalgias. Negative for arthralgias and gait problem.  Skin: Negative for color change and rash.  Neurological: Negative for dizziness and headaches.  Hematological: Negative for adenopathy. Does not bruise/bleed easily.  Psychiatric/Behavioral: Negative for suicidal ideas, sleep disturbance and dysphoric mood. The patient is not nervous/anxious.        Objective:   Physical Exam  Constitutional: She is oriented to person, place, and time. She appears well-developed and well-nourished. No distress.  HENT:  Head: Normocephalic and atraumatic.  Right Ear: External ear normal.  Left Ear: External ear normal.  Nose: Nose normal.  Mouth/Throat: Oropharynx is clear and moist. No oropharyngeal exudate.  Eyes: Conjunctivae normal are normal. Pupils are equal, round, and reactive to light. Right eye exhibits no discharge. Left eye exhibits no discharge. No scleral icterus.  Neck: Normal range of motion. Neck supple. No tracheal deviation present. No thyromegaly present.  Cardiovascular: Normal rate, regular rhythm, normal heart sounds and intact distal pulses.  Exam reveals no gallop and no friction rub.   No murmur heard. Pulmonary/Chest: Effort normal and breath sounds normal. No respiratory distress. She has no wheezes. She has no rales. She exhibits no tenderness.  Musculoskeletal: Normal range of motion. She exhibits no edema and no tenderness.  Lymphadenopathy:    She has no cervical adenopathy.  Neurological: She is alert and oriented to  person, place, and time. No cranial nerve deficit. She exhibits normal muscle tone. Coordination normal.  Skin: Skin is warm and dry. No rash noted. She is not diaphoretic. No erythema. No pallor.  Psychiatric: She has a normal mood and affect. Her behavior is normal. Judgment and thought content normal.          Assessment & Plan:

## 2012-11-13 NOTE — Assessment & Plan Note (Signed)
Pt will discuss with oncologist at next visit about resuming mammograms, given that she is still having q4 month PET scan.

## 2012-12-14 ENCOUNTER — Ambulatory Visit: Payer: Self-pay | Admitting: Internal Medicine

## 2012-12-16 ENCOUNTER — Telehealth: Payer: Self-pay | Admitting: Internal Medicine

## 2012-12-16 NOTE — Telephone Encounter (Signed)
Patient Information:  Caller Name: Adelita  Phone: [phone]  Patient: Bailey Brooks  Gender: Female  DOB: 10/15/1951  Age: 63 Years  PCP: Ronna Polio (Adults only)  Office Follow Up:  Does the office need to follow up with this patient?: No  Instructions For The Office: N/A   Symptoms  Reason For Call & Symptoms: Reports nasal congestion, drainage down her throat, productive cough with green mucus. Also reports watery eyes in addition to postnasal drip.  Reviewed Health History In EMR: Yes  Reviewed Medications In EMR: Yes  Reviewed Allergies In EMR: Yes  Reviewed Surgeries / Procedures: Yes  Date of Onset of Symptoms: 12/13/2012  Guideline(s) Used:  Cough  Disposition Per Guideline:   See Within 3 Days in Office  Reason For Disposition Reached:   Allergy symptoms are also present (e.g., itchy eyes, clear nasal discharge, postnasal drip)  Advice Given:  N/A  Appointment Scheduled:  12/17/2012 11:00:00 Appointment Scheduled Provider:  Dale Banks

## 2012-12-17 ENCOUNTER — Ambulatory Visit (INDEPENDENT_AMBULATORY_CARE_PROVIDER_SITE_OTHER): Payer: BC Managed Care – PPO | Admitting: Internal Medicine

## 2012-12-17 ENCOUNTER — Encounter: Payer: Self-pay | Admitting: Internal Medicine

## 2012-12-17 VITALS — BP 110/80 | HR 87 | Temp 99.2°F | Ht 62.5 in | Wt 220.5 lb

## 2012-12-17 DIAGNOSIS — J329 Chronic sinusitis, unspecified: Secondary | ICD-10-CM

## 2012-12-17 MED ORDER — CEFDINIR 300 MG PO CAPS: 300.0000 mg | ORAL_CAPSULE | Freq: Two times a day (BID) | ORAL | Status: DC

## 2012-12-17 MED ORDER — FLUTICASONE PROPIONATE 50 MCG/ACT NA SUSP: 2.0000 | Freq: Every day | NASAL | Status: DC

## 2012-12-17 NOTE — Patient Instructions (Addendum)
I am going to give you an antibiotic (omnicef) - take one capsule 2x/day.  Flush your nose with saline.  Use the flonase nasal spray as directed.  Let us know if persistent problems.

## 2012-12-20 ENCOUNTER — Encounter: Payer: Self-pay | Admitting: Internal Medicine

## 2012-12-20 NOTE — Progress Notes (Signed)
  Subjective:    Patient ID: Bailey Brooks, female    DOB: [birth year], 62 y.o.   MRN: [id number]  HPI 62 year old female with past history of lymphoma s/p stem cell transplant 05/2011 who comes in today as a work in with concerns regarding a reoccurring sinus infection.  States she was recently evaluated and treated with Augmentin.  Symptoms resolved.  Had return of symptoms approximately one week ago.  Increased sinus pressure.  Increased cough.  Productive green sputum.  Ears feel full.  No fever at home.  No chest tightness or wheezing.  Staying hydrated.  Usually takes Allegra for her allergies.    Past Medical History  Diagnosis Date  . 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 Seward Meth s/p [facility] and methotrexate, c/b renal failure  . Thyroid disease     Current Outpatient Prescriptions on File Prior to Visit  Medication Sig Dispense Refill  . Cholecalciferol (VITAMIN D) 2000 UNITS tablet Take 2,000 Units by mouth 2 (two) times daily.      Marland Kitchen KLOR-CON M10 10 MEQ tablet       . lansoprazole (PREVACID) 15 MG capsule Take 15 mg by mouth daily.      Marland Kitchen levothyroxine (SYNTHROID, LEVOTHROID) 50 MCG tablet Take 50 mcg by mouth daily.      Marland Kitchen OVER THE COUNTER MEDICATION 3 (three) times daily. SLOW MAG Calcium 238 mg  Chloride 405 mg Sodium 5 mg      . potassium chloride (K-DUR) 10 MEQ tablet Take 10 mEq by mouth 2 (two) times daily.      . valACYclovir (VALTREX) 500 MG tablet Take 500 mg by mouth 2 (two) times daily.      . fluticasone (FLONASE) 50 MCG/ACT nasal spray Place 2 sprays into the nose daily.  16 g  0    Review of Systems Patient denies any headache, lightheadedness or dizziness.  Does report the increased sinus pressure.  Ears feel full.  No chest pain, tightness or palpitations.  No increased shortness of breath or wheezing.  Some increased cough as outlined.  No nausea or vomiting.  No abdominal pain or cramping.  No bowel change, such as diarrhea.  No urine  change.        Objective:   Physical Exam Filed Vitals:   12/17/12 1123  BP: 110/80  Pulse: 87  Temp: 99.2 F (65.20 C)   62 year old female in no acute distress.   HEENT:  Nares - erythematous turbinates.   OP- without lesions or erythema.  TMs visualized - without erythema.  Minimal tenderness to palpation over the maxillary sinus.   NECK:  Supple, nontender.   HEART:  Appears to be regular. LUNGS:  Without crackles or wheezing audible.  Respirations even and unlabored.       Assessment & Plan:  PROBABLE SINUSITIS.  Symptoms and exam as outlined.  She desired not to take augmentin - gave her a yeast infection.  Omnicef 300mg  bid x 10 days.  Flush with saline nasal spray and use the Flonase as directed.  Robitussin as directed.  Rest.  Fluids.  Explained to her if symptoms changed, worsened or did not resolve - she was to be reevaluated.  Discussed abx and possible yeast infections with any abx.

## 2012-12-25 LAB — CBC CANCER CENTER
Eosinophil #: 0.1 x10 3/mm (ref 0.0–0.7)
MCH: 31.5 pg (ref 26.0–34.0)
MCHC: 34.3 g/dL (ref 32.0–36.0)
MCV: 92 fL (ref 80–100)
Monocyte #: 0.5 x10 3/mm (ref 0.2–0.9)
Neutrophil #: 4.1 x10 3/mm (ref 1.4–6.5)
Platelet: 162 x10 3/mm (ref 150–440)
RBC: 4.6 10*6/uL (ref 3.80–5.20)
WBC: 6.7 x10 3/mm (ref 3.6–11.0)

## 2012-12-25 LAB — ALKALINE PHOSPHATASE: Alkaline Phosphatase: 112 U/L (ref 50–136)

## 2012-12-25 LAB — LACTATE DEHYDROGENASE: LDH: 187 U/L (ref 81–246)

## 2013-01-01 ENCOUNTER — Other Ambulatory Visit: Payer: Self-pay | Admitting: *Deleted

## 2013-01-01 ENCOUNTER — Ambulatory Visit: Payer: Self-pay | Admitting: Internal Medicine

## 2013-01-01 MED ORDER — LEVOTHYROXINE SODIUM 50 MCG PO TABS: 50.0000 ug | ORAL_TABLET | Freq: Every day | ORAL | Status: DC

## 2013-01-04 ENCOUNTER — Ambulatory Visit: Payer: Self-pay | Admitting: Internal Medicine

## 2013-01-29 ENCOUNTER — Other Ambulatory Visit: Payer: Self-pay | Admitting: Internal Medicine

## 2013-02-01 ENCOUNTER — Ambulatory Visit: Payer: Self-pay | Admitting: Internal Medicine

## 2013-02-01 LAB — HM MAMMOGRAPHY: HM Mammogram: NORMAL

## 2013-02-17 LAB — HM PAP SMEAR: HM PAP: NEGATIVE

## 2013-02-19 LAB — LACTATE DEHYDROGENASE: LDH: 171 U/L (ref 81–246)

## 2013-02-19 LAB — CBC CANCER CENTER
Basophil #: 0 x10 3/mm (ref 0.0–0.1)
Basophil %: 0.4 %
Eosinophil #: 0.1 x10 3/mm (ref 0.0–0.7)
Lymphocyte %: 25.2 %
MCH: 31.6 pg (ref 26.0–34.0)
MCHC: 34 g/dL (ref 32.0–36.0)
MCV: 93 fL (ref 80–100)
Monocyte #: 0.4 x10 3/mm (ref 0.2–0.9)
Neutrophil #: 2.9 x10 3/mm (ref 1.4–6.5)
Neutrophil %: 64.5 %
RBC: 4.53 10*6/uL (ref 3.80–5.20)
WBC: 4.5 x10 3/mm (ref 3.6–11.0)

## 2013-02-19 LAB — CREATININE, SERUM
Creatinine: 0.99 mg/dL (ref 0.60–1.30)
EGFR (African American): >60
EGFR (Non-African Amer.): >60

## 2013-02-19 LAB — HEPATIC FUNCTION PANEL A (ARMC)
Alkaline Phosphatase: 98 U/L (ref 50–136)
SGOT(AST): 20 U/L (ref 15–37)

## 2013-02-27 ENCOUNTER — Ambulatory Visit (INDEPENDENT_AMBULATORY_CARE_PROVIDER_SITE_OTHER): Payer: BC Managed Care – PPO | Admitting: Internal Medicine

## 2013-02-27 ENCOUNTER — Encounter: Payer: Self-pay | Admitting: Internal Medicine

## 2013-02-27 ENCOUNTER — Other Ambulatory Visit (HOSPITAL_COMMUNITY)
Admission: RE | Admit: 2013-02-27 | Discharge: 2013-02-27 | Disposition: A | Discharge status: Home / Self Care | Payer: BC Managed Care – PPO | Source: Ambulatory Visit | Attending: Internal Medicine | Admitting: Internal Medicine

## 2013-02-27 VITALS — BP 116/72 | HR 87 | Temp 98.5°F | Wt 225.0 lb

## 2013-02-27 DIAGNOSIS — E039 Hypothyroidism, unspecified: Secondary | ICD-10-CM

## 2013-02-27 DIAGNOSIS — Z1151 Encounter for screening for human papillomavirus (HPV): Secondary | ICD-10-CM | POA: Insufficient documentation

## 2013-02-27 DIAGNOSIS — Z0000 Encounter for general adult medical examination without abnormal findings: Secondary | ICD-10-CM

## 2013-02-27 DIAGNOSIS — L309 Dermatitis, unspecified: Secondary | ICD-10-CM

## 2013-02-27 DIAGNOSIS — L259 Unspecified contact dermatitis, unspecified cause: Secondary | ICD-10-CM

## 2013-02-27 DIAGNOSIS — Z01419 Encounter for gynecological examination (general) (routine) without abnormal findings: Secondary | ICD-10-CM | POA: Insufficient documentation

## 2013-02-27 DIAGNOSIS — E785 Hyperlipidemia, unspecified: Secondary | ICD-10-CM

## 2013-02-27 DIAGNOSIS — B373 Candidiasis of vulva and vagina: Secondary | ICD-10-CM

## 2013-02-27 DIAGNOSIS — B3731 Acute candidiasis of vulva and vagina: Secondary | ICD-10-CM

## 2013-02-27 LAB — POCT URINALYSIS DIPSTICK
Bilirubin, UA: NEGATIVE
Glucose, UA: NEGATIVE
Nitrite, UA: NEGATIVE

## 2013-02-27 LAB — CBC WITH DIFFERENTIAL/PLATELET
Basophils Relative: 0.3 % (ref 0.0–3.0)
Eosinophils Absolute: 0 10*3/uL (ref 0.0–0.7)
Eosinophils Relative: 0.5 % (ref 0.0–5.0)
HCT: 41.2 % (ref 36.0–46.0)
Lymphs Abs: 1.1 10*3/uL (ref 0.7–4.0)
MCHC: 33.6 g/dL (ref 30.0–36.0)
MCV: 93.1 fl (ref 78.0–100.0)
Monocytes Absolute: 0.4 10*3/uL (ref 0.1–1.0)
Neutro Abs: 4.1 10*3/uL (ref 1.4–7.7)
Neutrophils Relative %: 72.2 % (ref 43.0–77.0)
RBC: 4.43 Mil/uL (ref 3.87–5.11)
WBC: 5.7 10*3/uL (ref 4.5–10.5)

## 2013-02-27 LAB — COMPREHENSIVE METABOLIC PANEL
ALT: 15 U/L (ref 0–35)
AST: 21 U/L (ref 0–37)
Albumin: 3.6 g/dL (ref 3.5–5.2)
Alkaline Phosphatase: 79 U/L (ref 39–117)
BUN: 20 mg/dL (ref 6–23)
CO2: 30 mEq/L (ref 19–32)
Calcium: 8.9 mg/dL (ref 8.4–10.5)
Chloride: 101 mEq/L (ref 96–112)
Creatinine, Ser: 1 mg/dL (ref 0.4–1.2)
GFR: 63.13 mL/min (ref >60.00)
Glucose, Bld: 84 mg/dL (ref 70–99)
Potassium: 3.7 mEq/L (ref 3.5–5.1)
Sodium: 138 mEq/L (ref 135–145)
Total Bilirubin: 0.3 mg/dL (ref 0.3–1.2)
Total Protein: 6.5 g/dL (ref 6.0–8.3)

## 2013-02-27 LAB — MAGNESIUM: Magnesium: 1.8 mg/dL (ref 1.5–2.5)

## 2013-02-27 LAB — LIPID PANEL
Cholesterol: 221 mg/dL — ABNORMAL HIGH (ref 0–200)
VLDL: 52.4 mg/dL — ABNORMAL HIGH (ref 0.0–40.0)

## 2013-02-27 LAB — LDL CHOLESTEROL, DIRECT: Direct LDL: 157.9 mg/dL

## 2013-02-27 MED ORDER — FLUCONAZOLE 150 MG PO TABS: 150.0000 mg | ORAL_TABLET | Freq: Every day | ORAL | Status: DC

## 2013-02-27 MED ORDER — TRIAMCINOLONE ACETONIDE 0.1 % EX CREA: TOPICAL_CREAM | Freq: Two times a day (BID) | CUTANEOUS | Status: DC

## 2013-02-27 MED ORDER — NYSTATIN-TRIAMCINOLONE 100000-0.1 UNIT/GM-% EX OINT: TOPICAL_OINTMENT | Freq: Two times a day (BID) | CUTANEOUS | Status: DC

## 2013-02-27 NOTE — Assessment & Plan Note (Signed)
Diffuse erythematous rash with white patches over vaginal introitus most consistent with candidiasis. Will treat with topical nystatin and oral Diflucan. Patient will return to clinic in one to 2 weeks for recheck.

## 2013-02-27 NOTE — Assessment & Plan Note (Signed)
Will check TSH with labs today. Continue levothyroxine. 

## 2013-02-27 NOTE — Assessment & Plan Note (Signed)
Erythematous rash over her right elbow and upper arm is most consistent with eczema. Will start topical triamcinolone cream. Patient will call her symptoms are not improving.

## 2013-02-27 NOTE — Assessment & Plan Note (Signed)
General medical exam including breast exam normal today. Pelvic exam is remarkable for vaginal candidiasis as described. Pap is pending. Will check basic labs today including CBC, CMP, lipid profile, TSH. Encouraged healthy diet, low in saturated fat and high in fiber. Encouraged regular physical activity. Will request notes on recent evaluation from oncologist. Follow up to recheck vaginal infection in one to 2 weeks.

## 2013-02-27 NOTE — Assessment & Plan Note (Signed)
Will check lipids and LFTs with labs today. 

## 2013-02-27 NOTE — Progress Notes (Signed)
Subjective:    Patient ID: Bailey Brooks, female    DOB: 02-22-1951, 62 y.o.   MRN: [id number]  HPI 62 year old female with history of lymphoma, hypothyroidism presents for annual exam. She reports she is generally been feeling well. She notes 2 concerns today. First, she reports a red rash over her right elbow and right upper arm. This is consistent with previous episodes of eczema. She has been applying topical hydrocortisone cream with no improvement. The rash is described as itchy. She denies any use of new lotions or creams.  Second, she is concerned about vaginal itching and a lesion over her right labia. This is been present for several months. She had similar issues in the past prior to chemotherapy but reports that symptoms have resolved. Over the last couple of months she has had a whitish discharge and itching. The right labia is tender. She denies any pelvic pain. She denies any bleeding. Next  Aside from this, she reports she is feeling well. She is compliant with her medications. She has had regular followup with her oncologist and reports recent labs were normal.  Outpatient Encounter Prescriptions as of 02/27/2013  Medication Sig Dispense Refill  . Cholecalciferol (VITAMIN D) 2000 UNITS tablet Take 2,000 Units by mouth 2 (two) times daily.      . fexofenadine-pseudoephedrine (ALLEGRA-D 24) 180-240 MG per 24 hr tablet Take 1 tablet by mouth daily.      . lansoprazole (PREVACID) 15 MG capsule Take 15 mg by mouth daily.      Marland Kitchen levothyroxine (SYNTHROID, LEVOTHROID) 50 MCG tablet take 1 tablet by mouth once daily  30 tablet  2  . OVER THE COUNTER MEDICATION 3 (three) times daily. SLOW MAG Calcium 238 mg  Chloride 405 mg Sodium 5 mg      . potassium chloride (K-DUR) 10 MEQ tablet Take 10 mEq by mouth 2 (two) times daily.      . valACYclovir (VALTREX) 500 MG tablet Take 500 mg by mouth 2 (two) times daily.      . cefdinir (OMNICEF) 300 MG capsule Take 1 capsule (300 mg total) by  mouth 2 (two) times daily.  20 capsule  0  . fluconazole (DIFLUCAN) 150 MG tablet Take 1 tablet (150 mg total) by mouth daily.  3 tablet  0  . fluticasone (FLONASE) 50 MCG/ACT nasal spray Place 2 sprays into the nose daily.  16 g  0  . KLOR-CON M10 10 MEQ tablet       . nystatin-triamcinolone ointment (MYCOLOG) Apply topically 2 (two) times daily.  60 g  1  . triamcinolone cream (KENALOG) 0.1 % Apply topically 2 (two) times daily.  30 g  0   No facility-administered encounter medications on file as of 02/27/2013.   BP 116/72  Pulse 87  Temp(Src) 98.5 F (36.9 C) (Oral)  Wt 225 lb (102.059 kg)  BMI 40.47 kg/m2  SpO2 97%  Review of Systems  Constitutional: Negative for fever, chills, appetite change, fatigue and unexpected weight change.  HENT: Negative for ear pain, congestion, sore throat, trouble swallowing, neck pain, voice change and sinus pressure.   Eyes: Negative for visual disturbance.  Respiratory: Negative for cough, shortness of breath, wheezing and stridor.   Cardiovascular: Negative for chest pain, palpitations and leg swelling.  Gastrointestinal: Negative for nausea, vomiting, abdominal pain, diarrhea, constipation, blood in stool, abdominal distention and anal bleeding.  Genitourinary: Positive for vaginal discharge and vaginal pain. Negative for dysuria, flank pain, vaginal bleeding, genital sores and pelvic  pain.  Musculoskeletal: Negative for myalgias, arthralgias and gait problem.  Skin: Positive for color change and rash.  Neurological: Negative for dizziness and headaches.  Hematological: Negative for adenopathy. Does not bruise/bleed easily.  Psychiatric/Behavioral: Negative for suicidal ideas, sleep disturbance and dysphoric mood. The patient is not nervous/anxious.        Objective:   Physical Exam  Constitutional: She is oriented to person, place, and time. She appears well-developed and well-nourished. No distress.  HENT:  Head: Normocephalic and  atraumatic.  Right Ear: External ear normal.  Left Ear: External ear normal.  Nose: Nose normal.  Mouth/Throat: Oropharynx is clear and moist. No oropharyngeal exudate.  Eyes: Conjunctivae are normal. Pupils are equal, round, and reactive to light. Right eye exhibits no discharge. Left eye exhibits no discharge. No scleral icterus.  Neck: Normal range of motion. Neck supple. No tracheal deviation present. No thyromegaly present.  Cardiovascular: Normal rate, regular rhythm, normal heart sounds and intact distal pulses.  Exam reveals no gallop and no friction rub.   No murmur heard. Pulmonary/Chest: Effort normal and breath sounds normal. No accessory muscle usage. Not tachypneic. No respiratory distress. She has no decreased breath sounds. She has no wheezes. She has no rhonchi. She has no rales. She exhibits no tenderness. Right breast exhibits no inverted nipple, no mass, no nipple discharge, no skin change and no tenderness. Left breast exhibits no inverted nipple, no mass, no nipple discharge, no skin change and no tenderness. Breasts are symmetrical.  Abdominal: Soft. Bowel sounds are normal. She exhibits no distension and no mass. There is no tenderness. There is no rebound and no guarding.  Genitourinary:    There is rash and tenderness on the right labia. There is rash on the left labia. There is erythema and tenderness around the vagina. No bleeding around the vagina.  Uterus surgically absent. Vaginal cuff present. Atrophic changes noted  Musculoskeletal: Normal range of motion. She exhibits no edema and no tenderness.  Lymphadenopathy:    She has no cervical adenopathy.  Neurological: She is alert and oriented to person, place, and time. No cranial nerve deficit. She exhibits normal muscle tone. Coordination normal.  Skin: Skin is warm and dry. No rash noted. She is not diaphoretic. No erythema. No pallor.  Psychiatric: She has a normal mood and affect. Her behavior is normal.  Judgment and thought content normal.          Assessment & Plan:

## 2013-02-28 ENCOUNTER — Encounter: Payer: Self-pay | Admitting: *Deleted

## 2013-02-28 ENCOUNTER — Telehealth: Payer: Self-pay | Admitting: Internal Medicine

## 2013-02-28 NOTE — Telephone Encounter (Signed)
Patient received two prescriptions for ointments and she does not know which cream goes where can someone give her a call.

## 2013-02-28 NOTE — Telephone Encounter (Signed)
Both the triamcinolone and Nystatin are to be applied in the vaginal area twice daily. The triamcinolone should also be applied to the right upper arm area of skin irritation twice daily.

## 2013-02-28 NOTE — Telephone Encounter (Signed)
Patient was seen yesterday and prescribe 2 ointments to use, could you please advise.

## 2013-02-28 NOTE — Telephone Encounter (Signed)
Patient informed and verbally agreed.  

## 2013-03-01 LAB — URINE CULTURE

## 2013-03-04 ENCOUNTER — Ambulatory Visit: Payer: Self-pay | Admitting: Internal Medicine

## 2013-03-06 ENCOUNTER — Encounter: Payer: Self-pay | Admitting: *Deleted

## 2013-03-11 ENCOUNTER — Encounter: Payer: Self-pay | Admitting: *Deleted

## 2013-03-18 ENCOUNTER — Ambulatory Visit: Payer: Self-pay | Admitting: Unknown Physician Specialty

## 2013-03-19 ENCOUNTER — Other Ambulatory Visit: Payer: BC Managed Care – PPO

## 2013-03-20 ENCOUNTER — Encounter: Payer: Self-pay | Admitting: Internal Medicine

## 2013-03-20 ENCOUNTER — Ambulatory Visit (INDEPENDENT_AMBULATORY_CARE_PROVIDER_SITE_OTHER): Payer: BC Managed Care – PPO | Admitting: Internal Medicine

## 2013-03-20 ENCOUNTER — Other Ambulatory Visit: Payer: BC Managed Care – PPO

## 2013-03-20 VITALS — BP 106/60 | HR 63 | Temp 98.3°F | Wt 224.0 lb

## 2013-03-20 DIAGNOSIS — N898 Other specified noninflammatory disorders of vagina: Secondary | ICD-10-CM

## 2013-03-20 DIAGNOSIS — R319 Hematuria, unspecified: Secondary | ICD-10-CM

## 2013-03-20 LAB — POCT URINALYSIS DIPSTICK
Bilirubin, UA: NEGATIVE
Glucose, UA: NEGATIVE
Nitrite, UA: NEGATIVE
Urobilinogen, UA: 0.2
pH, UA: 5.5

## 2013-03-20 NOTE — Assessment & Plan Note (Addendum)
Patient presents with lesion on her right labia. Initial appearance seemed most consistent with vaginal candidiasis with inflammation. However, after treatment for candidiasis, area of swelling on the right labia has not resolved. Will send vaginal culture. Will set up GYN evaluation for biopsy.

## 2013-03-20 NOTE — Progress Notes (Signed)
Subjective:    Patient ID: Bailey Brooks, female    DOB: 10-13-1951, 62 y.o.   MRN: [id number]  HPI 62 year old female with history of hypothyroidism presents for followup after recent issue with external genital pain and burning. Symptoms began a few weeks ago. She describes pain and burning mostly over her right labia. She was seen in clinic and initial presentation seem most consistent with vaginal candidiasis. She was treated with oral Diflucan and topical triamcinolone/nystatin cream with some improvement in her symptoms. However, irritation of the right labia has persisted. She reports that when she urinates she experiences some burning and irritation in the right labia. She occasionally has bouts of blood in her underwear. She denies any vaginal itching. She denies pelvic pain. She denies fever or chills.  Outpatient Encounter Prescriptions as of 03/20/2013  Medication Sig Dispense Refill  . Cholecalciferol (VITAMIN D) 2000 UNITS tablet Take 2,000 Units by mouth 2 (two) times daily.      . fexofenadine-pseudoephedrine (ALLEGRA-D 24) 180-240 MG per 24 hr tablet Take 1 tablet by mouth daily.      Marland Kitchen KLOR-CON M10 10 MEQ tablet       . Lansoprazole (PREVACID PO) Take 50 mg by mouth daily.      Marland Kitchen levothyroxine (SYNTHROID, LEVOTHROID) 50 MCG tablet take 1 tablet by mouth once daily  30 tablet  2  . OVER THE COUNTER MEDICATION 3 (three) times daily. SLOW MAG Calcium 238 mg  Chloride 405 mg Sodium 5 mg      . triamcinolone cream (KENALOG) 0.1 % Apply topically 2 (two) times daily.  30 g  0  . valACYclovir (VALTREX) 500 MG tablet Take 500 mg by mouth 2 (two) times daily.      . [DISCONTINUED] nystatin-triamcinolone ointment (MYCOLOG) Apply topically 2 (two) times daily.  60 g  1  . fluticasone (FLONASE) 50 MCG/ACT nasal spray Place 2 sprays into the nose daily.  16 g  0  . lansoprazole (PREVACID) 15 MG capsule Take 15 mg by mouth daily.      . polyethylene glycol powder (GLYCOLAX/MIRALAX) powder        . potassium chloride (K-DUR) 10 MEQ tablet Take 10 mEq by mouth 2 (two) times daily.       No facility-administered encounter medications on file as of 03/20/2013.   BP 106/60  Pulse 63  Temp(Src) 98.3 F (36.8 C) (Oral)  Wt 224 lb (101.606 kg)  BMI 40.29 kg/m2  SpO2 94%  Review of Systems  Constitutional: Negative for fever, chills, appetite change, fatigue and unexpected weight change.  HENT: Negative for voice change.   Eyes: Negative for visual disturbance.  Respiratory: Negative for cough, shortness of breath, wheezing and stridor.   Cardiovascular: Negative for chest pain, palpitations and leg swelling.  Gastrointestinal: Negative for nausea, vomiting, abdominal pain, diarrhea, constipation, blood in stool, abdominal distention and anal bleeding.  Genitourinary: Positive for vaginal bleeding, genital sores and vaginal pain. Negative for dysuria, flank pain, vaginal discharge and pelvic pain.  Musculoskeletal: Negative for myalgias, arthralgias and gait problem.  Skin: Negative for color change and rash.  Neurological: Negative for dizziness and headaches.  Hematological: Negative for adenopathy. Does not bruise/bleed easily.  Psychiatric/Behavioral: Negative for suicidal ideas, sleep disturbance and dysphoric mood. The patient is not nervous/anxious.        Objective:   Physical Exam  Constitutional: She is oriented to person, place, and time. She appears well-developed and well-nourished. No distress.  HENT:  Head: Normocephalic and atraumatic.  Right Ear: External ear normal.  Left Ear: External ear normal.  Nose: Nose normal.  Mouth/Throat: Oropharynx is clear and moist.  Eyes: Conjunctivae are normal. Pupils are equal, round, and reactive to light. Right eye exhibits no discharge. Left eye exhibits no discharge. No scleral icterus.  Neck: Normal range of motion. Neck supple. No tracheal deviation present. No thyromegaly present.  Cardiovascular: Normal heart  sounds.   Pulmonary/Chest: Effort normal.  Genitourinary:    There is rash, tenderness and lesion on the right labia. There is rash on the left labia. There is no tenderness on the left labia. No vaginal discharge found.  Musculoskeletal: Normal range of motion. She exhibits no edema and no tenderness.  Lymphadenopathy:    She has no cervical adenopathy.  Neurological: She is alert and oriented to person, place, and time. No cranial nerve deficit. She exhibits normal muscle tone. Coordination normal.  Skin: Skin is warm and dry. No rash noted. She is not diaphoretic. No erythema. No pallor.  Psychiatric: She has a normal mood and affect. Her behavior is normal. Judgment and thought content normal.          Assessment & Plan:

## 2013-03-24 NOTE — Addendum Note (Signed)
Addended by: Baldomero Lamy on: 03/24/2013 09:44 AM   Modules accepted: Orders

## 2013-03-25 ENCOUNTER — Telehealth: Payer: Self-pay | Admitting: *Deleted

## 2013-03-25 NOTE — Telephone Encounter (Signed)
Can we get a fungal culture on this instead of bacterial?

## 2013-03-25 NOTE — Telephone Encounter (Signed)
James @ [facility] called to report that labia culture sent 03/20/13 was not done due to sent in Aptima swab rather than a bacterial transport swab, like a wound culture. Aptima swab is good for 2 weeks if any testing wants to be done with it, otherwise would suggest recollection with bacterial transport swab is culture needing done.

## 2013-03-25 NOTE — Telephone Encounter (Signed)
Left message for James to call back.

## 2013-03-26 NOTE — Telephone Encounter (Signed)
Spoke with Fayrene Fearing at [facility], they can not do a culture on it at all but they can do a candida test to check if that is present.

## 2013-03-28 ENCOUNTER — Telehealth: Payer: Self-pay | Admitting: Internal Medicine

## 2013-03-28 NOTE — Telephone Encounter (Signed)
Have you received any results yet?

## 2013-03-28 NOTE — Telephone Encounter (Signed)
Patient informed once we receive the results we will give her a call.

## 2013-03-28 NOTE — Telephone Encounter (Signed)
They initially would not run the culture, but then said they would run a test for yeast only. This is still pending. We may need to call [facility] and ask for the results, as may not report automatically in Epic.

## 2013-03-28 NOTE — Telephone Encounter (Signed)
Pt called checking on her swab test she had done last week Please advise

## 2013-03-29 LAB — SPECIMEN STATUS REPORT

## 2013-03-29 LAB — C ALBICANS + C GLABRATA, NAA: Candida glabrata, NAA: NEGATIVE

## 2013-03-31 ENCOUNTER — Telehealth: Payer: Self-pay | Admitting: Internal Medicine

## 2013-03-31 NOTE — Telephone Encounter (Signed)
Culture was negative for yeast infection. Has pt been seen by OB/GYN?

## 2013-03-31 NOTE — Telephone Encounter (Signed)
They will fax the results now.

## 2013-04-01 NOTE — Telephone Encounter (Signed)
Left message to call back  

## 2013-04-01 NOTE — Telephone Encounter (Signed)
Advised of results. Pt states saw Dr. Tiburcio Pea yesterday, she was given a stronger ointment and pt has follow up appointment in 2 weeks. States will have a biopsy done at that time.

## 2013-04-02 NOTE — Telephone Encounter (Signed)
Patient informed, please refer to next encounter for further details.

## 2013-04-03 ENCOUNTER — Ambulatory Visit: Payer: Self-pay | Admitting: Internal Medicine

## 2013-04-03 HISTORY — PX: COLONOSCOPY: SHX174

## 2013-04-03 HISTORY — PX: ESOPHAGOGASTRODUODENOSCOPY: SHX1529

## 2013-04-16 LAB — CBC CANCER CENTER
Basophil #: 0 x10 3/mm (ref 0.0–0.1)
Eosinophil %: 0.7 %
HCT: 42.1 % (ref 35.0–47.0)
HGB: 14.2 g/dL (ref 12.0–16.0)
Lymphocyte #: 1.2 x10 3/mm (ref 1.0–3.6)
Lymphocyte %: 21.5 %
MCHC: 33.7 g/dL (ref 32.0–36.0)
MCV: 93 fL (ref 80–100)
Monocyte #: 0.5 x10 3/mm (ref 0.2–0.9)
Neutrophil #: 4 x10 3/mm (ref 1.4–6.5)
Platelet: 146 x10 3/mm — ABNORMAL LOW (ref 150–440)
RBC: 4.51 10*6/uL (ref 3.80–5.20)
RDW: 14.9 % — ABNORMAL HIGH (ref 11.5–14.5)
WBC: 5.8 x10 3/mm (ref 3.6–11.0)

## 2013-04-16 LAB — LACTATE DEHYDROGENASE: LDH: 163 U/L (ref 81–246)

## 2013-04-16 LAB — CREATININE, SERUM
Creatinine: 1.19 mg/dL (ref 0.60–1.30)
EGFR (African American): 56 — ABNORMAL LOW

## 2013-04-16 LAB — HEPATIC FUNCTION PANEL A (ARMC)
Albumin: 3.2 g/dL — ABNORMAL LOW (ref 3.4–5.0)
Alkaline Phosphatase: 104 U/L (ref 50–136)
Bilirubin, Direct: 0.1 mg/dL (ref 0.00–0.20)
SGOT(AST): 23 U/L (ref 15–37)
SGPT (ALT): 23 U/L (ref 12–78)

## 2013-04-16 LAB — POTASSIUM: Potassium: 4.2 mmol/L (ref 3.5–5.1)

## 2013-04-22 ENCOUNTER — Ambulatory Visit: Payer: Self-pay | Admitting: Internal Medicine

## 2013-04-29 ENCOUNTER — Other Ambulatory Visit: Payer: Self-pay | Admitting: Internal Medicine

## 2013-05-04 ENCOUNTER — Ambulatory Visit: Payer: Self-pay | Admitting: Internal Medicine

## 2013-05-06 ENCOUNTER — Ambulatory Visit: Payer: Self-pay | Admitting: Gynecologic Oncology

## 2013-05-06 DIAGNOSIS — I10 Essential (primary) hypertension: Secondary | ICD-10-CM

## 2013-05-06 LAB — CBC
HCT: 40.5 % (ref 35.0–47.0)
MCHC: 34.2 g/dL (ref 32.0–36.0)
RBC: 4.36 10*6/uL (ref 3.80–5.20)
RDW: 14.6 % — ABNORMAL HIGH (ref 11.5–14.5)
WBC: 3.2 10*3/uL — ABNORMAL LOW (ref 3.6–11.0)

## 2013-05-06 LAB — BASIC METABOLIC PANEL
Chloride: 107 mmol/L (ref 98–107)
Co2: 29 mmol/L (ref 21–32)
Creatinine: 0.84 mg/dL (ref 0.60–1.30)
EGFR (African American): >60
Potassium: 4.2 mmol/L (ref 3.5–5.1)

## 2013-05-13 ENCOUNTER — Ambulatory Visit: Payer: Self-pay | Admitting: Gynecologic Oncology

## 2013-05-13 DIAGNOSIS — C519 Malignant neoplasm of vulva, unspecified: Secondary | ICD-10-CM

## 2013-05-13 HISTORY — DX: Malignant neoplasm of vulva, unspecified: C51.9

## 2013-05-14 LAB — PATHOLOGY REPORT

## 2013-06-03 ENCOUNTER — Ambulatory Visit: Payer: Self-pay | Admitting: Internal Medicine

## 2013-06-11 LAB — CBC CANCER CENTER
Basophil #: 0 x10 3/mm (ref 0.0–0.1)
Basophil %: 0.3 %
HCT: 39.4 % (ref 35.0–47.0)
HGB: 13.7 g/dL (ref 12.0–16.0)
Lymphocyte %: 25 %
MCHC: 34.8 g/dL (ref 32.0–36.0)
MCV: 93 fL (ref 80–100)
Monocyte %: 9.3 %
Platelet: 170 x10 3/mm (ref 150–440)
WBC: 4.8 x10 3/mm (ref 3.6–11.0)

## 2013-06-11 LAB — HEPATIC FUNCTION PANEL A (ARMC)
Albumin: 3.3 g/dL — ABNORMAL LOW (ref 3.4–5.0)
Alkaline Phosphatase: 102 U/L (ref 50–136)
Bilirubin, Direct: 0.1 mg/dL (ref 0.00–0.20)
Bilirubin,Total: 0.3 mg/dL (ref 0.2–1.0)
SGOT(AST): 19 U/L (ref 15–37)

## 2013-06-11 LAB — URIC ACID: Uric Acid: 3.8 mg/dL (ref 2.6–6.0)

## 2013-06-11 LAB — CREATININE, SERUM
Creatinine: 1.09 mg/dL (ref 0.60–1.30)
EGFR (African American): >60
EGFR (Non-African Amer.): 54 — ABNORMAL LOW

## 2013-07-04 ENCOUNTER — Ambulatory Visit: Payer: Self-pay | Admitting: Internal Medicine

## 2013-08-11 ENCOUNTER — Ambulatory Visit: Payer: Self-pay | Admitting: Internal Medicine

## 2013-08-11 LAB — COMPREHENSIVE METABOLIC PANEL
Alkaline Phosphatase: 106 U/L (ref 50–136)
Anion Gap: 6 — ABNORMAL LOW (ref 7–16)
BUN: 18 mg/dL (ref 7–18)
Calcium, Total: 9.2 mg/dL (ref 8.5–10.1)
Chloride: 103 mmol/L (ref 98–107)
Co2: 32 mmol/L (ref 21–32)
EGFR (African American): 60 — ABNORMAL LOW
EGFR (Non-African Amer.): 52 — ABNORMAL LOW
Glucose: 105 mg/dL — ABNORMAL HIGH (ref 65–99)
Potassium: 4.1 mmol/L (ref 3.5–5.1)
SGPT (ALT): 23 U/L (ref 12–78)
Sodium: 141 mmol/L (ref 136–145)
Total Protein: 6.6 g/dL (ref 6.4–8.2)

## 2013-08-11 LAB — CBC CANCER CENTER
Basophil #: 0.1 x10 3/mm (ref 0.0–0.1)
Basophil %: 2.1 %
Eosinophil #: 0 x10 3/mm (ref 0.0–0.7)
Eosinophil %: 1 %
HCT: 42.1 % (ref 35.0–47.0)
Lymphocyte #: 0.9 x10 3/mm — ABNORMAL LOW (ref 1.0–3.6)
MCH: 30.6 pg (ref 26.0–34.0)
MCHC: 33.3 g/dL (ref 32.0–36.0)
MCV: 92 fL (ref 80–100)
Monocyte #: 0.3 x10 3/mm (ref 0.2–0.9)
Neutrophil #: 2.8 x10 3/mm (ref 1.4–6.5)
RBC: 4.57 10*6/uL (ref 3.80–5.20)
RDW: 13.5 % (ref 11.5–14.5)
WBC: 4.1 x10 3/mm (ref 3.6–11.0)

## 2013-08-11 LAB — LACTATE DEHYDROGENASE: LDH: 156 U/L (ref 81–246)

## 2013-09-03 ENCOUNTER — Ambulatory Visit: Payer: Self-pay | Admitting: Internal Medicine

## 2013-09-03 ENCOUNTER — Emergency Department: Payer: Self-pay | Admitting: Internal Medicine

## 2013-09-03 LAB — COMPREHENSIVE METABOLIC PANEL
Calcium, Total: 9.1 mg/dL (ref 8.5–10.1)
Creatinine: 0.98 mg/dL (ref 0.60–1.30)
EGFR (African American): >60
Glucose: 100 mg/dL — ABNORMAL HIGH (ref 65–99)
Osmolality: 277 (ref 275–301)
Potassium: 4 mmol/L (ref 3.5–5.1)
SGPT (ALT): 21 U/L (ref 12–78)
Sodium: 138 mmol/L (ref 136–145)
Total Protein: 6.3 g/dL — ABNORMAL LOW (ref 6.4–8.2)

## 2013-09-03 LAB — CBC
HCT: 39.9 % (ref 35.0–47.0)
HGB: 13.6 g/dL (ref 12.0–16.0)
MCH: 30.4 pg (ref 26.0–34.0)
MCHC: 34.1 g/dL (ref 32.0–36.0)
Platelet: 147 10*3/uL — ABNORMAL LOW (ref 150–440)
RBC: 4.47 10*6/uL (ref 3.80–5.20)

## 2013-09-03 LAB — URINALYSIS, COMPLETE
Bilirubin,UR: NEGATIVE
Protein: NEGATIVE
RBC,UR: 1 /HPF (ref 0–5)
Squamous Epithelial: NONE SEEN
WBC UR: 5 /HPF (ref 0–5)

## 2013-09-03 LAB — LIPASE, BLOOD: Lipase: 93 U/L (ref 73–393)

## 2013-10-04 ENCOUNTER — Ambulatory Visit: Payer: Self-pay | Admitting: Internal Medicine

## 2013-10-24 LAB — CBC CANCER CENTER
Basophil #: 0 x10 3/mm (ref 0.0–0.1)
Basophil %: 0.4 %
Eosinophil #: 0.1 x10 3/mm (ref 0.0–0.7)
Eosinophil %: 1.2 %
MCHC: 32.9 g/dL (ref 32.0–36.0)
MCV: 91 fL (ref 80–100)
Monocyte #: 0.4 x10 3/mm (ref 0.2–0.9)
Neutrophil #: 2.6 x10 3/mm (ref 1.4–6.5)
Neutrophil %: 57.7 %
Platelet: 153 x10 3/mm (ref 150–440)
RBC: 4.64 10*6/uL (ref 3.80–5.20)
RDW: 15.8 % — ABNORMAL HIGH (ref 11.5–14.5)

## 2013-10-24 LAB — HEPATIC FUNCTION PANEL A (ARMC)
Bilirubin,Total: 0.3 mg/dL (ref 0.2–1.0)
SGOT(AST): 21 U/L (ref 15–37)
SGPT (ALT): 24 U/L (ref 12–78)
Total Protein: 6.7 g/dL (ref 6.4–8.2)

## 2013-10-24 LAB — CREATININE, SERUM
Creatinine: 1.09 mg/dL (ref 0.60–1.30)
EGFR (Non-African Amer.): 54 — ABNORMAL LOW

## 2013-10-28 ENCOUNTER — Ambulatory Visit: Payer: Self-pay | Admitting: Internal Medicine

## 2013-11-03 ENCOUNTER — Ambulatory Visit: Payer: Self-pay | Admitting: Internal Medicine

## 2013-12-24 ENCOUNTER — Telehealth: Payer: Self-pay

## 2013-12-24 NOTE — Telephone Encounter (Signed)
Pt left v/m returning call; that was entire message. Left v/m for pt to call our office if that is correct office or call Dr Thomes Dinning office at [facility] at [phone].

## 2013-12-25 NOTE — Telephone Encounter (Signed)
Spoke with pts husband and he said he or pt would call Dr Thomes Dinning office today; Mr Schmoker was not sure reason for call and pt is at work.

## 2013-12-25 NOTE — Telephone Encounter (Signed)
Left message to call back. Has a question about medication, if he would be agreeable to try Nexium 40 mg BID instead of Protonix. Also calling patient about lab results

## 2014-01-01 NOTE — Telephone Encounter (Signed)
This chart was opened on patient's wife but it is in reference to her husband. Encounter will be closed for completion.

## 2014-01-22 ENCOUNTER — Ambulatory Visit: Payer: Self-pay | Admitting: Internal Medicine

## 2014-01-23 LAB — CBC CANCER CENTER
BASOS ABS: 0 x10 3/mm (ref 0.0–0.1)
Basophil %: 0.6 %
Eosinophil #: 0.1 x10 3/mm (ref 0.0–0.7)
Eosinophil %: 2 %
HCT: 42.5 % (ref 35.0–47.0)
HGB: 13.9 g/dL (ref 12.0–16.0)
LYMPHS PCT: 31.8 %
Lymphocyte #: 1.5 x10 3/mm (ref 1.0–3.6)
MCH: 30 pg (ref 26.0–34.0)
MCHC: 32.8 g/dL (ref 32.0–36.0)
MCV: 92 fL (ref 80–100)
Monocyte #: 0.4 x10 3/mm (ref 0.2–0.9)
Monocyte %: 8.7 %
NEUTROS PCT: 56.9 %
Neutrophil #: 2.7 x10 3/mm (ref 1.4–6.5)
PLATELETS: 161 x10 3/mm (ref 150–440)
RBC: 4.65 10*6/uL (ref 3.80–5.20)
RDW: 14.2 % (ref 11.5–14.5)
WBC: 4.8 x10 3/mm (ref 3.6–11.0)

## 2014-01-23 LAB — HEPATIC FUNCTION PANEL A (ARMC)
ALT: 24 U/L (ref 12–78)
Albumin: 3.2 g/dL — ABNORMAL LOW (ref 3.4–5.0)
Alkaline Phosphatase: 83 U/L
BILIRUBIN DIRECT: 0.1 mg/dL (ref 0.00–0.20)
Bilirubin,Total: 0.4 mg/dL (ref 0.2–1.0)
SGOT(AST): 21 U/L (ref 15–37)
TOTAL PROTEIN: 6.7 g/dL (ref 6.4–8.2)

## 2014-01-23 LAB — LACTATE DEHYDROGENASE: LDH: 165 U/L (ref 81–246)

## 2014-02-01 ENCOUNTER — Ambulatory Visit: Payer: Self-pay | Admitting: Internal Medicine

## 2014-03-16 ENCOUNTER — Other Ambulatory Visit: Payer: Self-pay | Admitting: Internal Medicine

## 2014-04-03 ENCOUNTER — Other Ambulatory Visit (HOSPITAL_COMMUNITY): Payer: Self-pay | Admitting: Internal Medicine

## 2014-04-17 ENCOUNTER — Other Ambulatory Visit: Payer: Self-pay | Admitting: Internal Medicine

## 2014-04-20 ENCOUNTER — Ambulatory Visit: Payer: Self-pay | Admitting: Internal Medicine

## 2014-04-24 LAB — HEPATIC FUNCTION PANEL A (ARMC)
ALK PHOS: 86 U/L
Albumin: 3.4 g/dL (ref 3.4–5.0)
BILIRUBIN DIRECT: 0.1 mg/dL (ref 0.00–0.20)
Bilirubin,Total: 0.4 mg/dL (ref 0.2–1.0)
SGOT(AST): 17 U/L (ref 15–37)
SGPT (ALT): 24 U/L (ref 12–78)
Total Protein: 6.7 g/dL (ref 6.4–8.2)

## 2014-04-24 LAB — CREATININE, SERUM
Creatinine: 0.94 mg/dL (ref 0.60–1.30)
EGFR (African American): >60
EGFR (Non-African Amer.): >60

## 2014-04-24 LAB — CBC CANCER CENTER
Basophil #: 0 x10 3/mm (ref 0.0–0.1)
Basophil %: 0.5 %
EOS PCT: 1.1 %
Eosinophil #: 0 x10 3/mm (ref 0.0–0.7)
HCT: 41.3 % (ref 35.0–47.0)
HGB: 13.8 g/dL (ref 12.0–16.0)
Lymphocyte #: 1.4 x10 3/mm (ref 1.0–3.6)
Lymphocyte %: 34.1 %
MCH: 30.8 pg (ref 26.0–34.0)
MCHC: 33.3 g/dL (ref 32.0–36.0)
MCV: 92 fL (ref 80–100)
MONO ABS: 0.4 x10 3/mm (ref 0.2–0.9)
MONOS PCT: 8.9 %
Neutrophil #: 2.3 x10 3/mm (ref 1.4–6.5)
Neutrophil %: 55.4 %
Platelet: 150 x10 3/mm (ref 150–440)
RBC: 4.47 10*6/uL (ref 3.80–5.20)
RDW: 14.2 % (ref 11.5–14.5)
WBC: 4.1 x10 3/mm (ref 3.6–11.0)

## 2014-04-24 LAB — LACTATE DEHYDROGENASE: LDH: 155 U/L (ref 81–246)

## 2014-04-24 LAB — POTASSIUM: Potassium: 4.1 mmol/L (ref 3.5–5.1)

## 2014-05-04 ENCOUNTER — Ambulatory Visit: Payer: Self-pay | Admitting: Internal Medicine

## 2014-05-13 ENCOUNTER — Ambulatory Visit: Payer: Self-pay | Admitting: Internal Medicine

## 2014-05-19 ENCOUNTER — Ambulatory Visit (INDEPENDENT_AMBULATORY_CARE_PROVIDER_SITE_OTHER): Payer: BC Managed Care – PPO | Admitting: Internal Medicine

## 2014-05-19 ENCOUNTER — Encounter: Payer: Self-pay | Admitting: Internal Medicine

## 2014-05-19 VITALS — BP 122/78 | HR 64 | Resp 16 | Ht 62.5 in | Wt 214.8 lb

## 2014-05-19 DIAGNOSIS — M79609 Pain in unspecified limb: Secondary | ICD-10-CM

## 2014-05-19 DIAGNOSIS — M79672 Pain in left foot: Secondary | ICD-10-CM | POA: Insufficient documentation

## 2014-05-19 DIAGNOSIS — E039 Hypothyroidism, unspecified: Secondary | ICD-10-CM

## 2014-05-19 DIAGNOSIS — E785 Hyperlipidemia, unspecified: Secondary | ICD-10-CM

## 2014-05-19 DIAGNOSIS — Z1239 Encounter for other screening for malignant neoplasm of breast: Secondary | ICD-10-CM

## 2014-05-19 DIAGNOSIS — C4499 Other specified malignant neoplasm of skin, unspecified: Secondary | ICD-10-CM

## 2014-05-19 DIAGNOSIS — L608 Other nail disorders: Secondary | ICD-10-CM | POA: Insufficient documentation

## 2014-05-19 DIAGNOSIS — L609 Nail disorder, unspecified: Secondary | ICD-10-CM

## 2014-05-19 LAB — COMPREHENSIVE METABOLIC PANEL
ALK PHOS: 77 U/L (ref 39–117)
ALT: 16 U/L (ref 0–35)
AST: 20 U/L (ref 0–37)
Albumin: 3.9 g/dL (ref 3.5–5.2)
BILIRUBIN TOTAL: 0.4 mg/dL (ref 0.2–1.2)
BUN: 20 mg/dL (ref 6–23)
CO2: 28 meq/L (ref 19–32)
Calcium: 9.6 mg/dL (ref 8.4–10.5)
Chloride: 103 mEq/L (ref 96–112)
Creatinine, Ser: 0.9 mg/dL (ref 0.4–1.2)
GFR: 68.69 mL/min (ref >60.00)
Glucose, Bld: 87 mg/dL (ref 70–99)
Potassium: 4.1 mEq/L (ref 3.5–5.1)
SODIUM: 137 meq/L (ref 135–145)
Total Protein: 6.5 g/dL (ref 6.0–8.3)

## 2014-05-19 LAB — MICROALBUMIN / CREATININE URINE RATIO
Creatinine,U: 141.9 mg/dL
Microalb Creat Ratio: 0.6 mg/g (ref 0.0–30.0)
Microalb, Ur: 0.8 mg/dL (ref 0.0–1.9)

## 2014-05-19 LAB — TSH: TSH: 3.62 u[IU]/mL (ref 0.35–4.50)

## 2014-05-19 MED ORDER — LEVOTHYROXINE SODIUM 50 MCG PO TABS: 50.0000 ug | ORAL_TABLET | Freq: Every day | ORAL | Status: DC

## 2014-05-19 NOTE — Progress Notes (Signed)
Subjective:    Patient ID: Bailey Brooks, female    DOB: 1951-01-21, 63 y.o.   MRN: [id number]  HPI 63YO female presents for follow up.  Labial lesion - was seen by GYN in 05/2013, treated again for possible yeast. No improvement. Had biopsy which showed Paget's disease. Referred to Dr. Sabra Heck. Area was surgically resected. In 11/2013, developed pain in this area. Diagnosed with shingles in lower back and groin. Started back on Valtrex. Two weeks ago follow up with Dr. Cynda Acres and Dr. Sabra Heck. No evidence of recurrence. PET scan completed results pending.  Joined [employer] and has lost about 20lbs. Exercising twice per week. Using FitBit.  Also notes some increased pain on the top of her left foot. Pain is aching that comes and goes.  No trauma to foot. Occurs with different footwear.  Also notes thickening of right great toenail and second toenail.  Review of Systems  Constitutional: Negative for fever, chills, appetite change, fatigue and unexpected weight change.  HENT: Negative for congestion, ear pain, sinus pressure, sore throat, trouble swallowing and voice change.   Eyes: Negative for visual disturbance.  Respiratory: Negative for cough, shortness of breath, wheezing and stridor.   Cardiovascular: Negative for chest pain, palpitations and leg swelling.  Gastrointestinal: Negative for nausea, vomiting, abdominal pain, diarrhea, constipation, blood in stool, abdominal distention and anal bleeding.  Genitourinary: Negative for dysuria and flank pain.  Musculoskeletal: Positive for arthralgias. Negative for gait problem, myalgias and neck pain.  Skin: Negative for color change and rash.  Neurological: Negative for dizziness and headaches.  Hematological: Negative for adenopathy. Does not bruise/bleed easily.  Psychiatric/Behavioral: Negative for suicidal ideas, sleep disturbance and dysphoric mood. The patient is not nervous/anxious.        Objective:    BP 122/78   Pulse 64  Resp 16  Ht 5' 2.5" (1.588 m)  Wt 214 lb 12 oz (97.41 kg)  BMI 38.63 kg/m2  SpO2 97% Physical Exam  Constitutional: She is oriented to person, place, and time. She appears well-developed and well-nourished. No distress.  HENT:  Head: Normocephalic and atraumatic.  Right Ear: External ear normal.  Left Ear: External ear normal.  Nose: Nose normal.  Mouth/Throat: Oropharynx is clear and moist. No oropharyngeal exudate.  Eyes: Conjunctivae are normal. Pupils are equal, round, and reactive to light. Right eye exhibits no discharge. Left eye exhibits no discharge. No scleral icterus.  Neck: Normal range of motion. Neck supple. No tracheal deviation present. No thyromegaly present.  Cardiovascular: Normal rate, regular rhythm, normal heart sounds and intact distal pulses.  Exam reveals no gallop and no friction rub.   No murmur heard. Pulmonary/Chest: Effort normal and breath sounds normal. No accessory muscle usage. Not tachypneic. No respiratory distress. She has no decreased breath sounds. She has no wheezes. She has no rhonchi. She has no rales. She exhibits no tenderness.  Musculoskeletal: Normal range of motion. She exhibits no edema and no tenderness.       Feet:  Lymphadenopathy:    She has no cervical adenopathy.  Neurological: She is alert and oriented to person, place, and time. No cranial nerve deficit. She exhibits normal muscle tone. Coordination normal.  Skin: Skin is warm and dry. No rash noted. She is not diaphoretic. No erythema. No pallor.     Psychiatric: She has a normal mood and affect. Her behavior is normal. Judgment and thought content normal.          Assessment & Plan:  Problem List Items Addressed This Visit     Unprioritized   Hyperlipidemia     Will check lipids and LFTs with labs.    Hypothyroidism - Primary     Will check TSH with labs today. Continue levothyroxine.    Relevant Medications      levothyroxine (SYNTHROID, LEVOTHROID)  tablet   Other Relevant Orders      TSH   Left foot pain     Left foot pain, suspect strain from sandal strap. Will set up podiatry evaluation. Question if orthotics might be helpful.    Paget disease, extra mammary     Paget's disease of the labia, s/p resection. Will request notes from her oncologist.    Relevant Orders      Comprehensive metabolic panel      Microalbumin / creatinine urine ratio   Screening for breast cancer   Relevant Orders      MM Digital Screening   Toenail deformity     Thickened toenail most consistent with fungal infection. Will set up podiatry evaluation for possible laser treatment. Pt is not a good candidate for Lamisil because of h/o chemo for lymphoma.    Relevant Orders      Ambulatory referral to Podiatry       Return in about 6 months (around 11/18/2014) for Physical.

## 2014-05-19 NOTE — Assessment & Plan Note (Signed)
Will check TSH with labs today. Continue levothyroxine.

## 2014-05-19 NOTE — Assessment & Plan Note (Signed)
Paget's disease of the labia, s/p resection. Will request notes from her oncologist.

## 2014-05-19 NOTE — Progress Notes (Signed)
Pre visit review using our clinic review tool, if applicable. No additional management support is needed unless otherwise documented below in the visit note. 

## 2014-05-19 NOTE — Assessment & Plan Note (Signed)
Will check lipids and LFTs with labs. 

## 2014-05-19 NOTE — Assessment & Plan Note (Signed)
Left foot pain, suspect strain from sandal strap. Will set up podiatry evaluation. Question if orthotics might be helpful.

## 2014-05-19 NOTE — Assessment & Plan Note (Signed)
Thickened toenail most consistent with fungal infection. Will set up podiatry evaluation for possible laser treatment. Pt is not a good candidate for Lamisil because of h/o chemo for lymphoma.

## 2014-05-20 ENCOUNTER — Encounter: Payer: Self-pay | Admitting: *Deleted

## 2014-05-21 DIAGNOSIS — K209 Esophagitis, unspecified without bleeding: Secondary | ICD-10-CM | POA: Insufficient documentation

## 2014-05-21 DIAGNOSIS — K449 Diaphragmatic hernia without obstruction or gangrene: Secondary | ICD-10-CM | POA: Insufficient documentation

## 2014-05-21 DIAGNOSIS — K2980 Duodenitis without bleeding: Secondary | ICD-10-CM | POA: Insufficient documentation

## 2014-06-08 ENCOUNTER — Ambulatory Visit: Payer: Self-pay | Admitting: Podiatry

## 2014-06-15 ENCOUNTER — Ambulatory Visit (INDEPENDENT_AMBULATORY_CARE_PROVIDER_SITE_OTHER): Payer: BC Managed Care – PPO

## 2014-06-15 ENCOUNTER — Encounter: Payer: Self-pay | Admitting: Podiatry

## 2014-06-15 ENCOUNTER — Ambulatory Visit (INDEPENDENT_AMBULATORY_CARE_PROVIDER_SITE_OTHER): Payer: BC Managed Care – PPO | Admitting: Podiatry

## 2014-06-15 VITALS — BP 138/69 | HR 76 | Resp 12

## 2014-06-15 DIAGNOSIS — L608 Other nail disorders: Secondary | ICD-10-CM

## 2014-06-15 DIAGNOSIS — M19072 Primary osteoarthritis, left ankle and foot: Secondary | ICD-10-CM

## 2014-06-15 DIAGNOSIS — R52 Pain, unspecified: Secondary | ICD-10-CM

## 2014-06-15 DIAGNOSIS — M19079 Primary osteoarthritis, unspecified ankle and foot: Secondary | ICD-10-CM

## 2014-06-15 NOTE — Progress Notes (Signed)
   Subjective:    Patient ID: Bailey Brooks, female    DOB: 03-08-1951, 63 y.o.   MRN: [id number]  HPI PT STATED LT TOP OF THE FOOT IS BEEN HURTING FOR 3 YEARS. THE FOOT IS GETTING WORSE. THE FOOT GET AGGRAVATED BY WALKING. TRIED NO TREATMENT.  ALSO. RT FOOT 1ST, 2ND TOENAIL AND LT 1ST GREAT TOENAIL ARE GROWING SLOWLY HAVE DISCOLORATION.   Review of Systems  All other systems reviewed and are negative.      Objective:   Physical Exam: I have reviewed her past medical history medications allergies surgeries social history and review of systems. Pulses are strongly palpable bilateral. Neurologic sensorium is intact per Semmes-Weinstein monofilament. Deep tendon reflexes are intact bilateral muscle strength is 5 over 5 dorsiflexors plantar flexors inverters everters all intrinsic musculature is intact. Orthopedic evaluation demonstrates all joints distal to the ankle a full range of motion without crepitation. She has pain on palpation to the hallux and second toenail plates right foot. She also has pain on palpation to the dorsal aspect of the left foot. Radiographic evaluation does demonstrate osteoarthritis to the midfoot left.        Assessment & Plan:  Assessment: Osteoarthritis midfoot left. Onychodystrophy to the hallux nail plate right and second digit right less degree left hallux.  Plan: Discussed appropriate shoe gear stretching exercises ice therapy. Discussed nail debridement which I performed today. And I will followup with her on an as-needed basis for injections.

## 2014-07-05 ENCOUNTER — Other Ambulatory Visit (HOSPITAL_COMMUNITY): Payer: Self-pay | Admitting: Internal Medicine

## 2014-07-13 ENCOUNTER — Other Ambulatory Visit: Payer: Self-pay | Admitting: Internal Medicine

## 2014-07-24 ENCOUNTER — Ambulatory Visit: Payer: Self-pay | Admitting: Internal Medicine

## 2014-07-27 LAB — CREATININE, SERUM
CREATININE: 0.98 mg/dL (ref 0.60–1.30)
EGFR (African American): >60
EGFR (Non-African Amer.): >60

## 2014-07-27 LAB — CBC CANCER CENTER
BASOS ABS: 0 x10 3/mm (ref 0.0–0.1)
Basophil %: 0.3 %
EOS ABS: 0.1 x10 3/mm (ref 0.0–0.7)
Eosinophil %: 1.1 %
HCT: 44.1 % (ref 35.0–47.0)
HGB: 14.4 g/dL (ref 12.0–16.0)
LYMPHS ABS: 1.4 x10 3/mm (ref 1.0–3.6)
Lymphocyte %: 31.8 %
MCH: 30.2 pg (ref 26.0–34.0)
MCHC: 32.6 g/dL (ref 32.0–36.0)
MCV: 93 fL (ref 80–100)
MONO ABS: 0.4 x10 3/mm (ref 0.2–0.9)
Monocyte %: 8.4 %
NEUTROS PCT: 58.4 %
Neutrophil #: 2.7 x10 3/mm (ref 1.4–6.5)
PLATELETS: 157 x10 3/mm (ref 150–440)
RBC: 4.75 10*6/uL (ref 3.80–5.20)
RDW: 14.1 % (ref 11.5–14.5)
WBC: 4.6 x10 3/mm (ref 3.6–11.0)

## 2014-07-27 LAB — HEPATIC FUNCTION PANEL A (ARMC)
ALK PHOS: 90 U/L
Albumin: 3.3 g/dL — ABNORMAL LOW (ref 3.4–5.0)
Bilirubin, Direct: <0.1 mg/dL (ref 0.00–0.20)
Bilirubin,Total: 0.3 mg/dL (ref 0.2–1.0)
SGOT(AST): 16 U/L (ref 15–37)
SGPT (ALT): 23 U/L
TOTAL PROTEIN: 6.8 g/dL (ref 6.4–8.2)

## 2014-07-27 LAB — LACTATE DEHYDROGENASE: LDH: 141 U/L (ref 81–246)

## 2014-08-04 ENCOUNTER — Ambulatory Visit: Payer: Self-pay | Admitting: Internal Medicine

## 2014-09-19 LAB — HM MAMMOGRAPHY: HM MAMMO: NEGATIVE

## 2014-09-21 ENCOUNTER — Encounter: Payer: Self-pay | Admitting: *Deleted

## 2014-10-21 ENCOUNTER — Ambulatory Visit: Payer: Self-pay | Admitting: Obstetrics and Gynecology

## 2014-11-02 LAB — POTASSIUM: POTASSIUM: 4.3 mmol/L (ref 3.5–5.1)

## 2014-11-02 LAB — CBC CANCER CENTER
BASOS ABS: 0 x10 3/mm (ref 0.0–0.1)
BASOS PCT: 0.3 %
EOS PCT: 0.9 %
Eosinophil #: 0 x10 3/mm (ref 0.0–0.7)
HCT: 43 % (ref 35.0–47.0)
HGB: 14 g/dL (ref 12.0–16.0)
LYMPHS ABS: 1.1 x10 3/mm (ref 1.0–3.6)
Lymphocyte %: 25.6 %
MCH: 30.4 pg (ref 26.0–34.0)
MCHC: 32.6 g/dL (ref 32.0–36.0)
MCV: 93 fL (ref 80–100)
Monocyte #: 0.3 x10 3/mm (ref 0.2–0.9)
Monocyte %: 7.3 %
NEUTROS PCT: 65.9 %
Neutrophil #: 2.9 x10 3/mm (ref 1.4–6.5)
Platelet: 156 x10 3/mm (ref 150–440)
RBC: 4.61 10*6/uL (ref 3.80–5.20)
RDW: 14.7 % — ABNORMAL HIGH (ref 11.5–14.5)
WBC: 4.4 x10 3/mm (ref 3.6–11.0)

## 2014-11-02 LAB — CREATININE, SERUM
Creatinine: 0.96 mg/dL (ref 0.60–1.30)
EGFR (African American): >60
EGFR (Non-African Amer.): >60

## 2014-11-02 LAB — HEPATIC FUNCTION PANEL A (ARMC)
Albumin: 3.3 g/dL — ABNORMAL LOW (ref 3.4–5.0)
Alkaline Phosphatase: 87 U/L
Bilirubin, Direct: 0.1 mg/dL (ref 0.0–0.2)
Bilirubin,Total: 0.3 mg/dL (ref 0.2–1.0)
SGOT(AST): 16 U/L (ref 15–37)
SGPT (ALT): 19 U/L
Total Protein: 6.6 g/dL (ref 6.4–8.2)

## 2014-11-02 LAB — MAGNESIUM: Magnesium: 1.9 mg/dL

## 2014-11-02 LAB — LACTATE DEHYDROGENASE: LDH: 151 U/L (ref 81–246)

## 2014-11-03 ENCOUNTER — Ambulatory Visit: Payer: Self-pay | Admitting: Internal Medicine

## 2014-11-03 ENCOUNTER — Ambulatory Visit: Payer: Self-pay | Admitting: Obstetrics and Gynecology

## 2014-11-23 ENCOUNTER — Encounter: Payer: Self-pay | Admitting: Internal Medicine

## 2014-11-23 ENCOUNTER — Ambulatory Visit (INDEPENDENT_AMBULATORY_CARE_PROVIDER_SITE_OTHER): Payer: BC Managed Care – PPO | Admitting: Internal Medicine

## 2014-11-23 VITALS — BP 148/82 | HR 63 | Temp 97.9°F | Ht 63.5 in | Wt 204.5 lb

## 2014-11-23 DIAGNOSIS — Z0000 Encounter for general adult medical examination without abnormal findings: Secondary | ICD-10-CM

## 2014-11-23 DIAGNOSIS — E669 Obesity, unspecified: Secondary | ICD-10-CM

## 2014-11-23 LAB — CBC WITH DIFFERENTIAL/PLATELET
BASOS ABS: 0 10*3/uL (ref 0.0–0.1)
Basophils Relative: 0.4 % (ref 0.0–3.0)
Eosinophils Absolute: 0.1 10*3/uL (ref 0.0–0.7)
Eosinophils Relative: 1.4 % (ref 0.0–5.0)
HEMATOCRIT: 42.4 % (ref 36.0–46.0)
Hemoglobin: 13.9 g/dL (ref 12.0–15.0)
LYMPHS ABS: 1.3 10*3/uL (ref 0.7–4.0)
Lymphocytes Relative: 28.2 % (ref 12.0–46.0)
MCHC: 32.8 g/dL (ref 30.0–36.0)
MCV: 92.1 fl (ref 78.0–100.0)
MONOS PCT: 8.3 % (ref 3.0–12.0)
Monocytes Absolute: 0.4 10*3/uL (ref 0.1–1.0)
NEUTROS PCT: 61.7 % (ref 43.0–77.0)
Neutro Abs: 2.8 10*3/uL (ref 1.4–7.7)
PLATELETS: 163 10*3/uL (ref 150.0–400.0)
RBC: 4.6 Mil/uL (ref 3.87–5.11)
RDW: 14.4 % (ref 11.5–15.5)
WBC: 4.5 10*3/uL (ref 4.0–10.5)

## 2014-11-23 LAB — MICROALBUMIN / CREATININE URINE RATIO
Creatinine,U: 92.4 mg/dL
Microalb Creat Ratio: 1.9 mg/g (ref 0.0–30.0)
Microalb, Ur: 1.8 mg/dL (ref 0.0–1.9)

## 2014-11-23 LAB — LIPID PANEL
Cholesterol: 226 mg/dL — ABNORMAL HIGH (ref 0–200)
HDL: 34 mg/dL — ABNORMAL LOW (ref >39.00)
LDL Cholesterol: 161 mg/dL — ABNORMAL HIGH (ref 0–99)
NonHDL: 192
TRIGLYCERIDES: 156 mg/dL — AB (ref 0.0–149.0)
Total CHOL/HDL Ratio: 7
VLDL: 31.2 mg/dL (ref 0.0–40.0)

## 2014-11-23 LAB — COMPREHENSIVE METABOLIC PANEL
ALBUMIN: 3.4 g/dL — AB (ref 3.5–5.2)
ALK PHOS: 77 U/L (ref 39–117)
ALT: 14 U/L (ref 0–35)
AST: 19 U/L (ref 0–37)
BUN: 22 mg/dL (ref 6–23)
CO2: 31 mEq/L (ref 19–32)
Calcium: 8.9 mg/dL (ref 8.4–10.5)
Chloride: 102 mEq/L (ref 96–112)
Creatinine, Ser: 0.8 mg/dL (ref 0.4–1.2)
GFR: 76.55 mL/min (ref >60.00)
Glucose, Bld: 88 mg/dL (ref 70–99)
POTASSIUM: 3.8 meq/L (ref 3.5–5.1)
Sodium: 137 mEq/L (ref 135–145)
Total Bilirubin: 0.5 mg/dL (ref 0.2–1.2)
Total Protein: 5.9 g/dL — ABNORMAL LOW (ref 6.0–8.3)

## 2014-11-23 LAB — HEMOGLOBIN A1C: HEMOGLOBIN A1C: 5.6 % (ref 4.6–6.5)

## 2014-11-23 LAB — MAGNESIUM: Magnesium: 1.8 mg/dL (ref 1.5–2.5)

## 2014-11-23 NOTE — Assessment & Plan Note (Signed)
Wt Readings from Last 3 Encounters:  11/23/14 204 lb 8 oz (92.761 kg)  05/19/14 214 lb 12 oz (97.41 kg)  03/20/13 224 lb (101.606 kg)   Body mass index is 35.65 kg/(m^2). Encouraged continued healthy diet and exercise with goal of weight loss.

## 2014-11-23 NOTE — Progress Notes (Signed)
Pre visit review using our clinic review tool, if applicable. No additional management support is needed unless otherwise documented below in the visit note. 

## 2014-11-23 NOTE — Progress Notes (Signed)
Subjective:    Patient ID: Bailey Brooks, female    DOB: 1951-08-19, 63 y.o.   MRN: [id number]  HPI 63YO female presents for physical exam.  Recently seen by oncologist and gyne-onc. Decision made to stop repeating PET scans.  Feeling well. No concerns today. Following a Weight Watchers diet and exercising regularly. Compliant with medications.  Past medical, surgical, family and social history per today's encounter.  Review of Systems  Constitutional: Negative for fever, chills, appetite change, fatigue and unexpected weight change.  Eyes: Negative for visual disturbance.  Respiratory: Negative for shortness of breath.   Cardiovascular: Negative for chest pain and leg swelling.  Gastrointestinal: Negative for nausea, vomiting, abdominal pain, diarrhea and constipation.  Musculoskeletal: Negative for myalgias and arthralgias.  Skin: Negative for color change and rash.  Hematological: Negative for adenopathy. Does not bruise/bleed easily.  Psychiatric/Behavioral: Negative for sleep disturbance and dysphoric mood. The patient is not nervous/anxious.        Objective:    BP 148/82 mmHg  Pulse 63  Temp(Src) 97.9 F (36.6 C) (Oral)  Ht 5' 3.5" (1.613 m)  Wt 204 lb 8 oz (92.761 kg)  BMI 35.65 kg/m2  SpO2 97% Physical Exam  Constitutional: She is oriented to person, place, and time. She appears well-developed and well-nourished. No distress.  HENT:  Head: Normocephalic and atraumatic.  Right Ear: External ear normal.  Left Ear: External ear normal.  Nose: Nose normal.  Mouth/Throat: Oropharynx is clear and moist. No oropharyngeal exudate.  Eyes: Conjunctivae are normal. Pupils are equal, round, and reactive to light. Right eye exhibits no discharge. Left eye exhibits no discharge. No scleral icterus.  Neck: Normal range of motion. Neck supple. No tracheal deviation present. No thyromegaly present.  Cardiovascular: Normal rate, regular rhythm, normal heart sounds and  intact distal pulses.  Exam reveals no gallop and no friction rub.   No murmur heard. Pulmonary/Chest: Effort normal and breath sounds normal. No accessory muscle usage. No tachypnea. No respiratory distress. She has no decreased breath sounds. She has no wheezes. She has no rales. She exhibits no tenderness. Right breast exhibits no inverted nipple, no mass, no nipple discharge, no skin change and no tenderness. Left breast exhibits no inverted nipple, no mass, no nipple discharge, no skin change and no tenderness. Breasts are symmetrical.  Abdominal: Soft. Bowel sounds are normal. She exhibits no distension and no mass. There is no tenderness. There is no rebound and no guarding.  Musculoskeletal: Normal range of motion. She exhibits no edema or tenderness.  Lymphadenopathy:    She has no cervical adenopathy.  Neurological: She is alert and oriented to person, place, and time. No cranial nerve deficit. She exhibits normal muscle tone. Coordination normal.  Skin: Skin is warm and dry. No rash noted. She is not diaphoretic. No erythema. No pallor.  Psychiatric: She has a normal mood and affect. Her behavior is normal. Judgment and thought content normal.          Assessment & Plan:   Problem List Items Addressed This Visit      Unprioritized   Obesity    Wt Readings from Last 3 Encounters:  11/23/14 204 lb 8 oz (92.761 kg)  05/19/14 214 lb 12 oz (97.41 kg)  03/20/13 224 lb (101.606 kg)   Body mass index is 35.65 kg/(m^2). Encouraged continued healthy diet and exercise with goal of weight loss.    Routine general medical examination at a health care facility - Primary  General medical exam normal today including breast exam. PAP and pelvic deferred as PAP normal 2014, HPV neg. Mammogram UTD. Colonoscopy UTD. Immunizations UTD. Labs today including CBC, CMP, lipids, A1c, TSH. Encouraged healthy diet and exercise.    Relevant Orders      CBC with Differential      TSH       Hemoglobin A1c      Comprehensive metabolic panel      Lipid panel      Microalbumin / creatinine urine ratio      Vit D  25 hydroxy (rtn osteoporosis monitoring)      Magnesium       Return in about 6 months (around 05/25/2015) for Recheck.

## 2014-11-23 NOTE — Assessment & Plan Note (Signed)
General medical exam normal today including breast exam. PAP and pelvic deferred as PAP normal 2014, HPV neg. Mammogram UTD. Colonoscopy UTD. Immunizations UTD. Labs today including CBC, CMP, lipids, A1c, TSH. Encouraged healthy diet and exercise.

## 2014-11-23 NOTE — Patient Instructions (Signed)

## 2014-11-24 LAB — VITAMIN D 25 HYDROXY (VIT D DEFICIENCY, FRACTURES): VITD: 59.67 ng/mL (ref 30.00–100.00)

## 2014-11-24 LAB — TSH: TSH: 6.65 u[IU]/mL — AB (ref 0.35–4.50)

## 2014-11-30 ENCOUNTER — Other Ambulatory Visit: Payer: Self-pay | Admitting: *Deleted

## 2014-11-30 MED ORDER — LEVOTHYROXINE SODIUM 75 MCG PO TABS: 75.0000 ug | ORAL_TABLET | Freq: Every day | ORAL | Status: DC

## 2014-12-04 HISTORY — PX: ROTATOR CUFF REPAIR: SHX139

## 2014-12-05 ENCOUNTER — Other Ambulatory Visit: Payer: Self-pay | Admitting: Internal Medicine

## 2015-01-31 ENCOUNTER — Other Ambulatory Visit (HOSPITAL_COMMUNITY): Payer: Self-pay | Admitting: Internal Medicine

## 2015-02-01 ENCOUNTER — Ambulatory Visit: Payer: Self-pay | Admitting: Internal Medicine

## 2015-02-02 ENCOUNTER — Ambulatory Visit
Admit: 2015-02-02 | Disposition: A | Discharge status: Home / Self Care | Payer: Self-pay | Attending: Internal Medicine | Admitting: Internal Medicine

## 2015-03-04 ENCOUNTER — Emergency Department
Admit: 2015-03-04 | Disposition: A | Discharge status: Home / Self Care | Payer: Self-pay | Admitting: Emergency Medicine

## 2015-03-04 LAB — URINALYSIS, COMPLETE
Bacteria: NONE SEEN
Bilirubin,UR: NEGATIVE
GLUCOSE, UR: NEGATIVE mg/dL (ref 0–75)
KETONE: NEGATIVE
NITRITE: NEGATIVE
Ph: 7 (ref 4.5–8.0)
Protein: NEGATIVE
RBC,UR: 10 /HPF (ref 0–5)
SPECIFIC GRAVITY: 1.017 (ref 1.003–1.030)
Squamous Epithelial: NONE SEEN
WBC UR: 8 /HPF (ref 0–5)

## 2015-03-05 LAB — URINE CULTURE

## 2015-03-17 ENCOUNTER — Ambulatory Visit
Admit: 2015-03-17 | Disposition: A | Discharge status: Home / Self Care | Payer: Self-pay | Attending: Internal Medicine | Admitting: Internal Medicine

## 2015-03-26 NOTE — Op Note (Signed)
PATIENT NAME:  Bailey Brooks, Bailey Brooks MR#:  [id number] DATE OF BIRTH:  01/24/1951  DATE OF PROCEDURE:  05/13/2013  PREOPERATIVE DIAGNOSIS: Paget's disease of the vulva.   POSTOPERATIVE DIAGNOSIS:  Paget's disease of the vulva.  PROCEDURE PERFORMED: Wide local excision.   SURGEON: Jacquelyne Balint, MD   COMPLICATIONS: None.   ESTIMATED BLOOD LOSS: 50 mL.   INDICATION FOR SURGERY: Ms. Esteve is a 64 year old patient who complained of vulvar irritation and on vulvar biopsy was noted to have an area consistent with Paget's disease. Therefore, decision was made to proceed with excision.   FINDINGS AT THE TIME OF SURGERY: Atrophic external genitalia and an area of reddish and white epithelium about 4 cm x 3 cm on the right labia minora. The remainder of the pelvic exam was unremarkable.   OPERATIVE REPORT: After adequate general anesthesia had been obtained, the patient was prepped and draped in ski position. The lesion was circumferentially incised with a 1 cm margin. The lower and the vaginal margin were separately incised and sent for frozen section analysis. After this turned out to contain Paget cells, another 1 cm margin was mobilized and excised. The entire specimen was mobilized from the underlying tissue using cautery. Hemostasis was achieved with cautery and figure-of-eight stitches using 3-0 Vicryl. Skin flaps were then raised from the surrounding tissue, mainly the thigh and the vagina. The tissues were then reapproximated with interrupted 2-0 Vicryl stitches. Hemostasis was noted to be adequate before the skin was closed with horizontal mattress sutures using 2-0 Vicryl. Hemostasis was adequate at the end of the procedure.     The patient tolerated the procedure well and was taken to the recovery room in stable condition. Pad, sponge, needle and instrument counts were correct x 2.     ____________________________ Weber Cooks, MD bem:cb D: 05/13/2013 17:15:25 ET T: 05/13/2013  20:42:09 ET JOB#: [id number]  cc: Weber Cooks, MD, <Dictator> Weber Cooks MD ELECTRONICALLY SIGNED 05/20/2013 9:59

## 2015-04-19 ENCOUNTER — Other Ambulatory Visit: Payer: Self-pay | Admitting: *Deleted

## 2015-04-19 DIAGNOSIS — Z8572 Personal history of non-Hodgkin lymphomas: Secondary | ICD-10-CM

## 2015-04-21 ENCOUNTER — Inpatient Hospital Stay: Payer: BLUE CROSS/BLUE SHIELD | Attending: Internal Medicine

## 2015-04-21 ENCOUNTER — Encounter (INDEPENDENT_AMBULATORY_CARE_PROVIDER_SITE_OTHER): Payer: Self-pay

## 2015-04-21 ENCOUNTER — Inpatient Hospital Stay: Payer: BLUE CROSS/BLUE SHIELD | Admitting: Internal Medicine

## 2015-04-21 ENCOUNTER — Encounter: Payer: Self-pay | Admitting: Internal Medicine

## 2015-04-21 VITALS — BP 112/80 | HR 60 | Temp 98.6°F | Resp 18 | Wt 198.4 lb

## 2015-04-21 DIAGNOSIS — C8590 Non-Hodgkin lymphoma, unspecified, unspecified site: Secondary | ICD-10-CM

## 2015-04-21 DIAGNOSIS — Z8544 Personal history of malignant neoplasm of other female genital organs: Secondary | ICD-10-CM | POA: Insufficient documentation

## 2015-04-21 DIAGNOSIS — Z9221 Personal history of antineoplastic chemotherapy: Secondary | ICD-10-CM | POA: Insufficient documentation

## 2015-04-21 DIAGNOSIS — Z8572 Personal history of non-Hodgkin lymphomas: Secondary | ICD-10-CM

## 2015-04-21 DIAGNOSIS — Z9484 Stem cells transplant status: Secondary | ICD-10-CM | POA: Insufficient documentation

## 2015-04-21 DIAGNOSIS — C8339 Diffuse large B-cell lymphoma, extranodal and solid organ sites: Secondary | ICD-10-CM | POA: Diagnosis not present

## 2015-04-21 LAB — CBC WITH DIFFERENTIAL/PLATELET
BASOS ABS: 0 10*3/uL (ref 0–0.1)
BASOS PCT: 0 %
Eosinophils Absolute: 0 10*3/uL (ref 0–0.7)
Eosinophils Relative: 1 %
HCT: 43.3 % (ref 35.0–47.0)
HEMOGLOBIN: 14.3 g/dL (ref 12.0–16.0)
Lymphocytes Relative: 34 %
Lymphs Abs: 1.4 10*3/uL (ref 1.0–3.6)
MCH: 30.2 pg (ref 26.0–34.0)
MCHC: 33 g/dL (ref 32.0–36.0)
MCV: 91.6 fL (ref 80.0–100.0)
Monocytes Absolute: 0.3 10*3/uL (ref 0.2–0.9)
Monocytes Relative: 8 %
NEUTROS ABS: 2.4 10*3/uL (ref 1.4–6.5)
NEUTROS PCT: 57 %
PLATELETS: 164 10*3/uL (ref 150–440)
RBC: 4.72 MIL/uL (ref 3.80–5.20)
RDW: 14.3 % (ref 11.5–14.5)
WBC: 4.2 10*3/uL (ref 3.6–11.0)

## 2015-04-21 LAB — HEPATIC FUNCTION PANEL
ALK PHOS: 75 U/L (ref 38–126)
ALT: 15 U/L (ref 14–54)
AST: 20 U/L (ref 15–41)
Albumin: 3.7 g/dL (ref 3.5–5.0)
Bilirubin, Direct: <0.1 mg/dL — ABNORMAL LOW (ref 0.1–0.5)
Total Bilirubin: 0.6 mg/dL (ref 0.3–1.2)
Total Protein: 6.8 g/dL (ref 6.5–8.1)

## 2015-04-21 LAB — CREATININE, SERUM
Creatinine, Ser: 0.83 mg/dL (ref 0.44–1.00)
GFR calc Af Amer: >60 mL/min (ref >60)

## 2015-04-21 LAB — LACTATE DEHYDROGENASE: LDH: 127 U/L (ref 98–192)

## 2015-04-21 NOTE — Progress Notes (Unsigned)
[facility] note   Referred by Jackolyn Confer, MD [address] [place], [place] [address]   This 64 y.o. female patient presents to the clinic for f/u, hx lymphoma   Chief Complaint/Problem List: has Personal history of lymphoma; Hypothyroidism; Obesity; Hyperlipidemia; Muscle cramps; Screening for breast cancer; Routine general medical examination at a health care facility; Eczema; Paget disease, extra mammary; Left foot pain; and Toenail deformity on her problem list.   1. 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 HIV and Hep SEROLOGIES NEG   2. Hepatic insufficiency from tumor resolved  3.ARF secondary to dehydration and contribution of hyperuricemia, required dialysis, resolved. 4. Tumor lysis followed first cycle chemotx resolved  5.. Tia with aphasia followed cycle 3, ,  contrast MRI negative, no recurrent or other neuro symptoms 6  hospitalization after cycle 4 for neutropenic fever, c diff diarrhea, chemotx induced colon inflammation, possible divertiiculitis, tx for diverticulitis and c diff simultaneously 7 Occlusion of dual lumen port, replacement  with dual lumen hickman a  8 s/p ,, 6 cycles of systemic therapy and 4 cycles of intrathecal treatment  8. Now S/P transplant  at [facility].. patient had BEAM chemotherapy followed by autologous stem cell reinfusion,, day 1 of therapy was June 11/12, patient release from daily clinic on June 27/12.  10.  diverticulitis and hospitalization, see  D/C summary.  11. Pagets disease of vulva s/p surgery HPI:   .As prior noted,  Was seen at [facility]  08/28/11. Advised not to complete inthrthecal tx.   Ommaya reservoir neurosurgery recommends device be left in place permanently .  Has been released from [place], ,. As prior noted,  VAC SCHEDULE PRIOR REVIEWED WITH TRANSPLANT COORDINATOR, THE 23 VALENT VACCINE DOES NOT NEED TO BE BOOSTED. SINCE HIB, IPV, TDAP, HEP B, GIVEN 07/26/12, DUE 2 MONTHS  LATER WHICH was 10/11/12   FOR HIB, HEP B, IPV, AND TDAP , then 10 MONTHS LATER I 08/2013, COMPLETED HIB, HEP B, IPV, TDAP, AND  MMR .   TODAY,  ..   no complaints, no weaknness, no sob, no pain.Marland Kitchenas prior noted. s/p PET  04/22/13 OK, EXCEPT ABNORMAL AREA VULVA COMPATABLE WITH SURGICAL SITE  .  , As prior noted,  patient mammogram,  at [facility] prior every February, reports last in 09/2014, normal, due 09/2015 .  Had colonoscopy and EGD April 15/14 with Dr. Vira Agar, esophagitis, int hemorrhoids, diverticulosis, otherwise benign. Has had  vaginal surgery for pagets disease, Had recent f/u with gyn, and has f/u in July.  Has prior hx shingls after d/c valtrex prophylaxis, so restarted indefinately.  Today, for follow-up. Up in check and the labs every 3 months at 3 months ago unremarkable today's CBC and chemistries also done. No fever chills or sweats no focal or constitutional symptoms subjectively feels well  Review of Systems:  General: No acute distress, No fatigue, No recent weight loss, fever chills or sweats   HEENT: No headache, dizziness, ear or jaw pain, or epistaxis   Lungs: No cough, shortness of breath, at rest, No SOBOE, No wheezing, No chest pain, No, hemoptysis  Cardiac: no chest pain, no palpitations, no orthostasis, no lower extremity    GI: no abdominal pain, nausea, vomiting, diahrrea, or reflux   GU: no dysuria, no hematuria, no vaginal bleeding  Musculoskeletal: no back pain, no bone pain, no acutely painful joints,   Extremities: no upper or lower extremity edema  Skin: no bruising, no rash  Neuro: no headache, no dizzy, no focal weakness   Psych: no anxiety, no depression   Allergies Allergies  Allergen Reactions  . Erythromycin Nausea And Vomiting  . Iodine Swelling    IV iodine  . Morphine And Related Nausea And Vomiting    Hallucinations     Significant History/PMH: Past Medical History  Diagnosis Date  . 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 Madelynn Done s/p RCHOP and methotrexate, c/b renal failure  . Thyroid disease   . Paget disease, extra mammary     vulva, s/p resection 2014, Dr. Sabra Heck   Past Surgical History  Procedure Laterality Date  . Tonsillectomy  1954  . Limbal stem cell transplant  2012  . Liver biopsy      stage 4B large Bcell lymphoma  . Burr hole w/ placement ommaya reservoir    . Breast biopsy  2002    Neg - AT [facility]  . Abdominal hysterectomy  1985            Smoking History: Never smoker,   PFSH: Family History:  Family History  Problem Relation Age of Onset  . Diabetes Mother   . Hypertension Mother   . Cancer Father 29    Melanoma  . Kidney disease Sister     Kidney removed   . Cancer Brother     Prostate - dx in 26's  . Hearing loss Maternal Aunt   . Hearing loss Maternal Grandfather   . Heart disease Brother 49    MI & DVT in heart    Comments:   Social History:  History  Alcohol Use No    Additional Past Medical and Surgical History:   TIA   Home Medications: Prior to Admission medications   Medication Sig Start Date End Date Taking? Authorizing Provider  Cholecalciferol (VITAMIN D) 2000 UNITS tablet Take 2,000 Units by mouth 2 (two) times daily.    Historical Provider, MD  lansoprazole (PREVACID) 30 MG capsule Take by mouth. 05/21/14   Historical Provider, MD  levothyroxine (SYNTHROID, LEVOTHROID) 75 MCG tablet Take 1 tablet (75 mcg total) by mouth daily before breakfast. 11/30/14   Jackolyn Confer, MD  OVER THE COUNTER MEDICATION 2 (two) times daily. SLOW MAG Calcium 238 mg  Chloride 405 mg Sodium 5 mg    Historical Provider, MD  polyethylene glycol powder (GLYCOLAX/MIRALAX) powder  03/16/13   Historical Provider, MD  potassium chloride (K-DUR) 10 MEQ tablet take 1 tablet by mouth twice a day 12/05/14   Jackolyn Confer, MD  valACYclovir (VALTREX) 500 MG tablet Take 500 mg by mouth 2 (two) times daily.    Historical Provider, MD    Vital Signs:  There were no vitals taken for  this visit.  Physical Exam:  General: well developed, well nourished, and no acute distress  Mental Status: alert and oriented to person, place and time  Head, Ears, Nose,Throat: No thrush  Respiratory: no rales, rhonchi, or wheezing, no dullness  Cardiovascular: regular rate and rhythm  Gastrointestinal: soft, non tender, no masses or organomegaly  Musculoskeletal: no lower extremity edema no calf tenderness  Skin: no rashes, no bruises  Neurological: No gross focal weakness cranial nerves intact  Lymphatics: Not palpable, neck supraclavicular, submandibular, axilla    Psych: Mood, Affect, Unremarkable    Laboratory Results: Appointment on 04/21/2015  Component Date Value Ref Range Status  . WBC 04/21/2015 4.2  3.6 - 11.0 K/uL Final  . RBC  04/21/2015 4.72  3.80 - 5.20 MIL/uL Final  . Hemoglobin 04/21/2015 14.3  12.0 - 16.0 g/dL Final  . HCT 04/21/2015 43.3  35.0 - 47.0 % Final  . MCV 04/21/2015 91.6  80.0 - 100.0 fL Final  . MCH 04/21/2015 30.2  26.0 - 34.0 pg Final  . MCHC 04/21/2015 33.0  32.0 - 36.0 g/dL Final  . RDW 04/21/2015 14.3  11.5 - 14.5 % Final  . Platelets 04/21/2015 164  150 - 440 K/uL Final  . Neutrophils Relative % 04/21/2015 57   Final  . Neutro Abs 04/21/2015 2.4  1.4 - 6.5 K/uL Final  . Lymphocytes Relative 04/21/2015 34   Final  . Lymphs Abs 04/21/2015 1.4  1.0 - 3.6 K/uL Final  . Monocytes Relative 04/21/2015 8   Final  . Monocytes Absolute 04/21/2015 0.3  0.2 - 0.9 K/uL Final  . Eosinophils Relative 04/21/2015 1   Final  . Eosinophils Absolute 04/21/2015 0.0  0 - 0.7 K/uL Final  . Basophils Relative 04/21/2015 0   Final  . Basophils Absolute 04/21/2015 0.0  0 - 0.1 K/uL Final  . Creatinine, Ser 04/21/2015 0.83  0.44 - 1.00 mg/dL Final  . GFR calc non Af Amer 04/21/2015 >60  >60 mL/min Final  . GFR calc Af Amer 04/21/2015 >60  >60 mL/min Final   Comment: (NOTE) The eGFR has been calculated using the CKD EPI equation. This calculation has not been  validated in all clinical situations. eGFR's persistently <60 mL/min signify possible Chronic Kidney Disease.   . Total Protein 04/21/2015 6.8  6.5 - 8.1 g/dL Final  . Albumin 04/21/2015 3.7  3.5 - 5.0 g/dL Final  . AST 04/21/2015 20  15 - 41 U/L Final  . ALT 04/21/2015 15  14 - 54 U/L Final  . Alkaline Phosphatase 04/21/2015 75  38 - 126 U/L Final  . Total Bilirubin 04/21/2015 0.6  0.3 - 1.2 mg/dL Final  . Bilirubin, Direct 04/21/2015 <0.1* 0.1 - 0.5 mg/dL Final  . Indirect Bilirubin 04/21/2015 NOT CALCULATED  0.3 - 0.9 mg/dL Final  . LDH 04/21/2015 127  98 - 192 U/L Final          Radiology Results: No results found.         Assessment and Plan: Impression: Also see the problem list above as well as earlier notes including 03/13/11 , the last PET scan was negative in June 2015 and that was the last of the regularly recommended interval scans now 3 years post transplant. Following labs at 3 month intervals and see me in 6 months. Following with GYN for history of Paget's disease. Gets mammogram [facility] every year through primary physician prior completed revaccination program as an earlier notes, is on Valtrex prophylaxis for shingles as noted    Plan: Continue same medicine. Continue primary care follow-up. Mammogram every October at [facility] followed by primary care. Clinical breast exams primary care physician. GYN follow-up in July check labs every 3 months E me for labs and clinical exam every 6 months call for any new symptoms

## 2015-04-22 ENCOUNTER — Other Ambulatory Visit: Payer: Self-pay | Admitting: *Deleted

## 2015-04-22 DIAGNOSIS — Z8572 Personal history of non-Hodgkin lymphomas: Secondary | ICD-10-CM

## 2015-04-27 ENCOUNTER — Other Ambulatory Visit: Payer: Self-pay | Admitting: Internal Medicine

## 2015-05-25 ENCOUNTER — Encounter: Payer: Self-pay | Admitting: *Deleted

## 2015-05-25 ENCOUNTER — Encounter: Payer: Self-pay | Admitting: Internal Medicine

## 2015-05-25 ENCOUNTER — Ambulatory Visit (INDEPENDENT_AMBULATORY_CARE_PROVIDER_SITE_OTHER): Payer: BLUE CROSS/BLUE SHIELD | Admitting: Internal Medicine

## 2015-05-25 VITALS — BP 128/78 | HR 70 | Temp 98.2°F | Ht 63.5 in | Wt 199.0 lb

## 2015-05-25 DIAGNOSIS — R030 Elevated blood-pressure reading, without diagnosis of hypertension: Secondary | ICD-10-CM

## 2015-05-25 DIAGNOSIS — F4323 Adjustment disorder with mixed anxiety and depressed mood: Secondary | ICD-10-CM | POA: Diagnosis not present

## 2015-05-25 DIAGNOSIS — E669 Obesity, unspecified: Secondary | ICD-10-CM | POA: Diagnosis not present

## 2015-05-25 DIAGNOSIS — IMO0001 Reserved for inherently not codable concepts without codable children: Secondary | ICD-10-CM | POA: Insufficient documentation

## 2015-05-25 DIAGNOSIS — E039 Hypothyroidism, unspecified: Secondary | ICD-10-CM | POA: Diagnosis not present

## 2015-05-25 DIAGNOSIS — M25561 Pain in right knee: Secondary | ICD-10-CM

## 2015-05-25 LAB — TSH: TSH: 4.4 u[IU]/mL (ref 0.35–4.50)

## 2015-05-25 LAB — T4, FREE: Free T4: 0.94 ng/dL (ref 0.60–1.60)

## 2015-05-25 NOTE — Assessment & Plan Note (Signed)
Recent stressors at home and work. Offered support today. She declines any medication to help with symptoms. She will continue to follow up with her counselor.

## 2015-05-25 NOTE — Progress Notes (Signed)
Pre visit review using our clinic review tool, if applicable. No additional management support is needed unless otherwise documented below in the visit note. 

## 2015-05-25 NOTE — Assessment & Plan Note (Signed)
Will check Thyroid function with labs today. Continue Levothyroxine.

## 2015-05-25 NOTE — Assessment & Plan Note (Signed)
Recent right knee pain. Exam normal today. Symptoms have resolved. Will continue to monitor for any recurrent symptoms. Continue prn Tylenol and Ibuprofen.
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
## 2015-05-25 NOTE — Assessment & Plan Note (Signed)
Wt Readings from Last 3 Encounters:  05/25/15 199 lb (90.266 kg)  04/21/15 198 lb 6.6 oz (90 kg)  11/23/14 204 lb 8 oz (92.761 kg)   Body mass index is 34.69 kg/(m^2). Encouraged continued effort at healthy diet and exercise.

## 2015-05-25 NOTE — Assessment & Plan Note (Signed)
BP Readings from Last 3 Encounters:  05/25/15 128/78  04/21/15 112/80  11/23/14 148/82   BP elevated on initial check. Will recheck BP in 1 month.

## 2015-05-25 NOTE — Patient Instructions (Signed)
Labs today.  Follow up recheck of blood pressure in 4 weeks.

## 2015-05-27 ENCOUNTER — Telehealth: Payer: Self-pay | Admitting: *Deleted

## 2015-05-27 ENCOUNTER — Emergency Department: Payer: BLUE CROSS/BLUE SHIELD

## 2015-05-27 ENCOUNTER — Emergency Department
Admission: EM | Admit: 2015-05-27 | Discharge: 2015-05-27 | Disposition: A | Discharge status: Home / Self Care | Payer: BLUE CROSS/BLUE SHIELD | Attending: Emergency Medicine | Admitting: Emergency Medicine

## 2015-05-27 ENCOUNTER — Encounter: Payer: Self-pay | Admitting: Emergency Medicine

## 2015-05-27 DIAGNOSIS — Y998 Other external cause status: Secondary | ICD-10-CM | POA: Diagnosis not present

## 2015-05-27 DIAGNOSIS — S0081XA Abrasion of other part of head, initial encounter: Secondary | ICD-10-CM | POA: Insufficient documentation

## 2015-05-27 DIAGNOSIS — S60512A Abrasion of left hand, initial encounter: Secondary | ICD-10-CM | POA: Insufficient documentation

## 2015-05-27 DIAGNOSIS — S4991XA Unspecified injury of right shoulder and upper arm, initial encounter: Secondary | ICD-10-CM | POA: Diagnosis not present

## 2015-05-27 DIAGNOSIS — Z79899 Other long term (current) drug therapy: Secondary | ICD-10-CM | POA: Insufficient documentation

## 2015-05-27 DIAGNOSIS — W010XXA Fall on same level from slipping, tripping and stumbling without subsequent striking against object, initial encounter: Secondary | ICD-10-CM | POA: Insufficient documentation

## 2015-05-27 DIAGNOSIS — S60511A Abrasion of right hand, initial encounter: Secondary | ICD-10-CM | POA: Diagnosis not present

## 2015-05-27 DIAGNOSIS — M79601 Pain in right arm: Secondary | ICD-10-CM

## 2015-05-27 DIAGNOSIS — Y9289 Other specified places as the place of occurrence of the external cause: Secondary | ICD-10-CM | POA: Diagnosis not present

## 2015-05-27 DIAGNOSIS — Y9302 Activity, running: Secondary | ICD-10-CM | POA: Insufficient documentation

## 2015-05-27 MED ORDER — BACITRACIN ZINC 500 UNIT/GM EX OINT
TOPICAL_OINTMENT | CUTANEOUS | Status: AC
  Administered 2015-05-27: 12:00:00
  Filled 2015-05-27: qty 0.9
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
## 2015-05-27 NOTE — ED Provider Notes (Signed)
[facility] Emergency Department Provider Note   ____________________________________________  Time seen: 1100  I have reviewed the triage vital signs and the nursing notes.   HISTORY  Chief Complaint Fall   History limited by: Not Limited   HPI Bailey Brooks is a 64 y.o. female Who presents to the emergency department today after a fall. Patient states she was running when she did not pick up her foot enough and tripped. The patient suffered abrasions to her left cheek, bilateral palms. She is complaining primarily of pain in her right shoulder. The pain is severe. The pain is worse when she attempts to elevate her arm. She denies loss of consciousness. Denies any blurred vision, slurred speech or change in sensation or strength in any extremities. Denies being on any blood thinners.     Past Medical History  Diagnosis Date  . 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 Madelynn Done s/p RCHOP and methotrexate, c/b renal failure  . Thyroid disease   . Paget disease, extra mammary     vulva, s/p resection 2014, Dr. Sabra Heck    Patient Active Problem List   Diagnosis Date Noted  . Elevated BP 05/25/2015  . Adjustment disorder with mixed anxiety and depressed mood 05/25/2015  . Knee pain, right 05/25/2015  . 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 Routine general medical examination at a health care facility 02/27/2013  . Eczema 02/27/2013  . Muscle cramps 11/13/2012  . Screening for breast cancer 11/13/2012  . Personal history of lymphoma 02/20/2012  . Hypothyroidism 02/20/2012  . Obesity 02/20/2012  . Hyperlipidemia 02/20/2012    Past Surgical History  Procedure Laterality Date  . Tonsillectomy  1954  . Limbal stem cell transplant  2012  . Liver biopsy      stage 4B large Bcell lymphoma  . Burr hole w/ placement ommaya reservoir    . Breast biopsy  2002    Neg - AT [facility]  . Abdominal hysterectomy  1985    Current  Outpatient Rx  Name  Route  Sig  Dispense  Refill  . Cholecalciferol (VITAMIN D) 2000 UNITS tablet   Oral   Take 2,000 Units by mouth 2 (two) times daily.         . lansoprazole (PREVACID) 30 MG capsule   Oral   Take by mouth.         . levothyroxine (SYNTHROID, LEVOTHROID) 75 MCG tablet      take 1 tablet by mouth once daily BEFORE BREAKFAST   30 tablet   6   . OVER THE COUNTER MEDICATION      2 (two) times daily. SLOW MAG Calcium 238 mg  Chloride 405 mg Sodium 5 mg         . potassium chloride (K-DUR) 10 MEQ tablet      take 1 tablet by mouth twice a day   180 tablet   1   . valACYclovir (VALTREX) 500 MG tablet   Oral   Take 500 mg by mouth 2 (two) times daily.           Allergies Erythromycin; Iodine; and Morphine and related  Family History  Problem Relation Age of Onset  . Diabetes Mother   . Hypertension Mother   . Cancer Father 66    Melanoma  . Kidney disease Sister     Kidney removed   . Cancer Brother     Prostate - dx in 17's  .  Hearing loss Maternal Aunt   . Hearing loss Maternal Grandfather   . Heart disease Brother 55    MI & DVT in heart    Social History History  Substance Use Topics  . Smoking status: Never Smoker   . Smokeless tobacco: Never Used  . Alcohol Use: No    Review of Systems  Constitutional: Negative for fever. Cardiovascular: Negative for chest pain. Respiratory: Negative for shortness of breath. Gastrointestinal: Negative for abdominal pain, vomiting and diarrhea. Genitourinary: Negative for dysuria. Musculoskeletal: Negative for back pain.right shoulder pain Skin: Negative for rash. Neurological: Negative for headaches, focal weakness or numbness.   10-point ROS otherwise negative.  ____________________________________________   PHYSICAL EXAM:  VITAL SIGNS: ED Triage Vitals  Enc Vitals Group     BP 05/27/15 0827 143/81 mmHg     Pulse Rate 05/27/15 0827 67     Resp 05/27/15 0827 18     Temp  05/27/15 0827 98 F (36.7 C)     Temp Source 05/27/15 0827 Oral     SpO2 05/27/15 0827 99 %     Weight 05/27/15 0827 198 lb (89.812 kg)     Height 05/27/15 0827 5\' 3"  (1.6 m)     Head Cir --      Peak Flow --      Pain Score 05/27/15 0831 8   Constitutional: Alert and oriented. Well appearing and in no distress. Eyes: Conjunctivae are normal. PERRL. Normal extraocular movements. ENT   Head: Normocephalic and atraumatic.   Nose: No congestion/rhinnorhea.   Mouth/Throat: Mucous membranes are moist.   Neck: No stridor. Hematological/Lymphatic/Immunilogical: No cervical lymphadenopathy. Cardiovascular: Normal rate, regular rhythm.  No murmurs, rubs, or gallops. Respiratory: Normal respiratory effort without tachypnea nor retractions. Breath sounds are clear and equal bilaterally. No wheezes/rales/rhonchi. Gastrointestinal: Soft and nontender. No distention. There is no CVA tenderness. Genitourinary: Deferred Musculoskeletal: right shoulder with some limited range of motion secondary to pain. Slightly tender to palpation about the shoulder. No obvious deformity. Neurologic:  Normal speech and language. No gross focal neurologic deficits are appreciated. Speech is normal.  Skin:  Abrasions to the left cheek, bilateral palms. Psychiatric: Mood and affect are normal. Speech and behavior are normal. Patient exhibits appropriate insight and judgment.  ____________________________________________    LABS (pertinent positives/negatives)  None  ____________________________________________   EKG  None  ____________________________________________    RADIOLOGY  Shoulder Right IMPRESSION: Bony overgrowth along the inferior acromion. Underlying generalized osteoarthritic change. No fracture or dislocation.  Bony overgrowth along the inferior acromion may place patient at risk for so-called impingement  syndrome.  ____________________________________________   PROCEDURES  Procedure(s) performed: none  Critical Care performed: No  ____________________________________________   INITIAL IMPRESSION / ASSESSMENT AND PLAN / ED COURSE  Pertinent labs & imaging results that were available during my care of the patient were reviewed by me and considered in my medical decision making (see chart for details).  Patient presents to the emergency department today after mechanical fall. Right shoulder x-ray negative. No other significant traumatic injury identified on physical exam. Mild abrasions. No right arm in sling for comfort but did discuss that patient needs to take her arm out of the sling occasionally. Additionally will give orthopedic follow-up.  ____________________________________________   FINAL CLINICAL IMPRESSION(S) / ED DIAGNOSES  Final diagnoses:  Right arm pain     Nance Pear, MD 05/27/15 1307

## 2015-05-27 NOTE — ED Notes (Signed)
Pt tripped and fell this am, abrasion to left side of face and bilat hands.  Pain to right shoulder

## 2015-05-27 NOTE — Telephone Encounter (Signed)
Pt was advised to be seen in ED. Per Epic, she is currently being evaluated in ED. FYI

## 2015-05-27 NOTE — Telephone Encounter (Signed)
[facility] Call:  Chief Complaint Shoulder Injury Initial Comment caller states his wife was running this am and fell - she has injured her right shoulder PreDisposition Call Doctor Nurse Assessment Nurse: Erlene Quan, RN, Manuela Schwartz Date/Time Eilene Ghazi Time): 05/27/2015 7:21:43 AM Confirm and document reason for call. If symptomatic, describe symptoms. ---caller states she was running this am and fell - she has injured her right shoulder - she also scraped her hands and her face - she is unable to use the right arm at all - states they have a doctor who exercises with them who helped her up and did a quick assessment - states did not look dislocated but possible fracture , she has ice on it now Has the patient traveled out of the country within the last 30 days? ---Not Applicable Does the patient require triage? ---Yes Related visit to physician within the last 2 weeks? ---No Does the PT have any chronic conditions? (i.e. diabetes, asthma, etc.) ---No Guidelines Guideline Title Affirmed Question Affirmed Notes Nurse Date/Time (Eastern Time) Shoulder Injury Can't move injured shoulder at all Macario Carls 05/27/2015 7:24:34 AM Disp. Time Eilene Ghazi Time) Disposition Final User 05/27/2015 7:26:47 AM Go to ED Now Yes Erlene Quan, RN, Edwena Bunde Understands: Yes Disagree/Comply: Comply PLEASE NOTE: All timestamps contained within this report are represented as [place] Standard Time. CONFIDENTIALTY NOTICE: This fax transmission is intended only for the addressee. It contains information that is legally privileged, confidential or otherwise protected from use or disclosure. If you are not the intended recipient, you are strictly prohibited from reviewing, disclosing, copying using or disseminating any of this information or taking any action in reliance on or regarding this information. If you have received this fax in error, please notify us immediately by telephone so that we can arrange for  its return to [place]. Phone: [phone], Toll-Free: [phone], Fax: [phone] Page: 2 of 2 Call Id: [id number] Care Advice Given Per Guideline GO TO ED NOW: You need to be seen in the Emergency Department. Go to the ER at ___________ [place] now. Drive carefully. DRIVING: Another adult should drive. NOTHING BY MOUTH: Do not eat or drink anything for now. (Reason: condition may need surgery and general anesthesia) CARE ADVICE given per Shoulder Injury (Adult) guideline. FIRST AID ADVICE FOR SUSPECTED FRACTURE OR DISLOCATION OF THE SHOULDER: * Use a sling to support the arm. Make the sling with a triangular piece of cloth. * Or, at the very least. the patient can support the injured arm with the other hand or a pillow. After Care Instructions Given Call Event Type User Date / Time Description Referrals [facility] - ED

## 2015-05-27 NOTE — Discharge Instructions (Signed)
Please seek medical attention for any high fevers, chest pain, shortness of breath, change in behavior, persistent vomiting, bloody stool or any other new or concerning symptoms.  Musculoskeletal Pain Musculoskeletal pain is muscle and boney aches and pains. These pains can occur in any part of the body. Your caregiver may treat you without knowing the cause of the pain. They may treat you if blood or urine tests, X-rays, and other tests were normal.  CAUSES There is often not a definite cause or reason for these pains. These pains may be caused by a type of germ (virus). The discomfort may also come from overuse. Overuse includes working out too hard when your body is not fit. Boney aches also come from weather changes. Bone is sensitive to atmospheric pressure changes. HOME CARE INSTRUCTIONS   Ask when your test results will be ready. Make sure you get your test results.  Only take over-the-counter or prescription medicines for pain, discomfort, or fever as directed by your caregiver. If you were given medications for your condition, do not drive, operate machinery or power tools, or sign legal documents for 24 hours. Do not drink alcohol. Do not take sleeping pills or other medications that may interfere with treatment.  Continue all activities unless the activities cause more pain. When the pain lessens, slowly resume normal activities. Gradually increase the intensity and duration of the activities or exercise.  During periods of severe pain, bed rest may be helpful. Lay or sit in any position that is comfortable.  Putting ice on the injured area.  Put ice in a bag.  Place a towel between your skin and the bag.  Leave the ice on for 15 to 20 minutes, 3 to 4 times a day.  Follow up with your caregiver for continued problems and no reason can be found for the pain. If the pain becomes worse or does not go away, it may be necessary to repeat tests or do additional testing. Your caregiver  may need to look further for a possible cause. SEEK IMMEDIATE MEDICAL CARE IF:  You have pain that is getting worse and is not relieved by medications.  You develop chest pain that is associated with shortness or breath, sweating, feeling sick to your stomach (nauseous), or throw up (vomit).  Your pain becomes localized to the abdomen.  You develop any new symptoms that seem different or that concern you. MAKE SURE YOU:   Understand these instructions.  Will watch your condition.  Will get help right away if you are not doing well or get worse. Document Released: 11/20/2005 Document Revised: 02/12/2012 Document Reviewed: 07/25/2013 [facility] Patient Information 2015 [place], [place]. This information is not intended to replace advice given to you by your health care provider. Make sure you discuss any questions you have with your health care provider.

## 2015-06-03 ENCOUNTER — Encounter: Payer: Self-pay | Admitting: Internal Medicine

## 2015-06-03 ENCOUNTER — Other Ambulatory Visit: Payer: Self-pay | Admitting: Orthopedic Surgery

## 2015-06-03 DIAGNOSIS — S46001A Unspecified injury of muscle(s) and tendon(s) of the rotator cuff of right shoulder, initial encounter: Secondary | ICD-10-CM

## 2015-06-03 DIAGNOSIS — R29898 Other symptoms and signs involving the musculoskeletal system: Secondary | ICD-10-CM

## 2015-06-11 ENCOUNTER — Ambulatory Visit
Admission: RE | Admit: 2015-06-11 | Discharge: 2015-06-11 | Disposition: A | Discharge status: Home / Self Care | Payer: BLUE CROSS/BLUE SHIELD | Source: Ambulatory Visit | Attending: Orthopedic Surgery | Admitting: Orthopedic Surgery

## 2015-06-11 DIAGNOSIS — M75101 Unspecified rotator cuff tear or rupture of right shoulder, not specified as traumatic: Secondary | ICD-10-CM | POA: Diagnosis not present

## 2015-06-11 DIAGNOSIS — S46001A Unspecified injury of muscle(s) and tendon(s) of the rotator cuff of right shoulder, initial encounter: Secondary | ICD-10-CM

## 2015-06-11 DIAGNOSIS — M25711 Osteophyte, right shoulder: Secondary | ICD-10-CM | POA: Diagnosis not present

## 2015-06-11 DIAGNOSIS — M25411 Effusion, right shoulder: Secondary | ICD-10-CM | POA: Diagnosis not present

## 2015-06-11 DIAGNOSIS — M25511 Pain in right shoulder: Secondary | ICD-10-CM | POA: Diagnosis present

## 2015-06-11 DIAGNOSIS — M6281 Muscle weakness (generalized): Secondary | ICD-10-CM | POA: Diagnosis present

## 2015-06-11 DIAGNOSIS — R29898 Other symptoms and signs involving the musculoskeletal system: Secondary | ICD-10-CM

## 2015-06-16 ENCOUNTER — Other Ambulatory Visit: Payer: Self-pay | Admitting: Internal Medicine

## 2015-06-16 ENCOUNTER — Inpatient Hospital Stay: Payer: BLUE CROSS/BLUE SHIELD

## 2015-06-21 DIAGNOSIS — M75122 Complete rotator cuff tear or rupture of left shoulder, not specified as traumatic: Secondary | ICD-10-CM | POA: Insufficient documentation

## 2015-06-22 ENCOUNTER — Encounter: Payer: Self-pay | Admitting: *Deleted

## 2015-06-22 ENCOUNTER — Ambulatory Visit
Admission: RE | Admit: 2015-06-22 | Discharge: 2015-06-22 | Disposition: A | Discharge status: Home / Self Care | Payer: BLUE CROSS/BLUE SHIELD | Source: Ambulatory Visit | Attending: Surgery | Admitting: Surgery

## 2015-06-22 ENCOUNTER — Encounter
Admission: RE | Disposition: A | Discharge status: Home / Self Care | Payer: Self-pay | Source: Ambulatory Visit | Attending: Surgery

## 2015-06-22 ENCOUNTER — Ambulatory Visit: Payer: BLUE CROSS/BLUE SHIELD | Admitting: Anesthesiology

## 2015-06-22 DIAGNOSIS — F329 Major depressive disorder, single episode, unspecified: Secondary | ICD-10-CM | POA: Diagnosis not present

## 2015-06-22 DIAGNOSIS — Z808 Family history of malignant neoplasm of other organs or systems: Secondary | ICD-10-CM | POA: Insufficient documentation

## 2015-06-22 DIAGNOSIS — Z90710 Acquired absence of both cervix and uterus: Secondary | ICD-10-CM | POA: Diagnosis not present

## 2015-06-22 DIAGNOSIS — E669 Obesity, unspecified: Secondary | ICD-10-CM | POA: Diagnosis not present

## 2015-06-22 DIAGNOSIS — Z6833 Body mass index (BMI) 33.0-33.9, adult: Secondary | ICD-10-CM | POA: Insufficient documentation

## 2015-06-22 DIAGNOSIS — Z86010 Personal history of colonic polyps: Secondary | ICD-10-CM | POA: Insufficient documentation

## 2015-06-22 DIAGNOSIS — Z8379 Family history of other diseases of the digestive system: Secondary | ICD-10-CM | POA: Insufficient documentation

## 2015-06-22 DIAGNOSIS — Z881 Allergy status to other antibiotic agents status: Secondary | ICD-10-CM | POA: Diagnosis not present

## 2015-06-22 DIAGNOSIS — Z882 Allergy status to sulfonamides status: Secondary | ICD-10-CM | POA: Insufficient documentation

## 2015-06-22 DIAGNOSIS — Z79899 Other long term (current) drug therapy: Secondary | ICD-10-CM | POA: Diagnosis not present

## 2015-06-22 DIAGNOSIS — Z9484 Stem cells transplant status: Secondary | ICD-10-CM | POA: Diagnosis not present

## 2015-06-22 DIAGNOSIS — M75101 Unspecified rotator cuff tear or rupture of right shoulder, not specified as traumatic: Secondary | ICD-10-CM | POA: Insufficient documentation

## 2015-06-22 DIAGNOSIS — W1839XA Other fall on same level, initial encounter: Secondary | ICD-10-CM | POA: Diagnosis not present

## 2015-06-22 DIAGNOSIS — E039 Hypothyroidism, unspecified: Secondary | ICD-10-CM | POA: Insufficient documentation

## 2015-06-22 DIAGNOSIS — Z885 Allergy status to narcotic agent status: Secondary | ICD-10-CM | POA: Diagnosis not present

## 2015-06-22 DIAGNOSIS — Z8042 Family history of malignant neoplasm of prostate: Secondary | ICD-10-CM | POA: Diagnosis not present

## 2015-06-22 DIAGNOSIS — Z9221 Personal history of antineoplastic chemotherapy: Secondary | ICD-10-CM | POA: Insufficient documentation

## 2015-06-22 DIAGNOSIS — Z91041 Radiographic dye allergy status: Secondary | ICD-10-CM | POA: Insufficient documentation

## 2015-06-22 HISTORY — PX: SHOULDER ARTHROSCOPY: SHX128

## 2015-06-22 LAB — BASIC METABOLIC PANEL
ANION GAP: 6 (ref 5–15)
BUN: 24 mg/dL — AB (ref 6–20)
CALCIUM: 8.9 mg/dL (ref 8.9–10.3)
CO2: 28 mmol/L (ref 22–32)
Chloride: 106 mmol/L (ref 101–111)
Creatinine, Ser: 0.77 mg/dL (ref 0.44–1.00)
GFR calc Af Amer: >60 mL/min (ref >60)
GFR calc non Af Amer: >60 mL/min (ref >60)
GLUCOSE: 90 mg/dL (ref 65–99)
POTASSIUM: 4 mmol/L (ref 3.5–5.1)
SODIUM: 140 mmol/L (ref 135–145)

## 2015-06-22 SURGERY — ARTHROSCOPY, SHOULDER
Anesthesia: General | Laterality: Right

## 2015-06-22 MED ORDER — ONDANSETRON HCL 4 MG/2ML IJ SOLN
INTRAMUSCULAR | Status: DC | PRN
  Administered 2015-06-22: 4 mg via INTRAVENOUS

## 2015-06-22 MED ORDER — PROPOFOL 10 MG/ML IV BOLUS
INTRAVENOUS | Status: DC | PRN
  Administered 2015-06-22: 170 mg via INTRAVENOUS

## 2015-06-22 MED ORDER — FENTANYL CITRATE (PF) 100 MCG/2ML IJ SOLN: 25.0000 ug | INTRAMUSCULAR | Status: DC | PRN

## 2015-06-22 MED ORDER — CEFAZOLIN SODIUM-DEXTROSE 2-3 GM-% IV SOLR
2.0000 g | Freq: Once | INTRAVENOUS | Status: AC
  Administered 2015-06-22: 2 g via INTRAVENOUS

## 2015-06-22 MED ORDER — POTASSIUM CHLORIDE IN NACL 20-0.9 MEQ/L-% IV SOLN
INTRAVENOUS | Status: DC
  Filled 2015-06-22: qty 1000

## 2015-06-22 MED ORDER — LACTATED RINGERS IV SOLN
INTRAVENOUS | Status: DC | PRN
  Administered 2015-06-22: 11:00:00 via INTRAVENOUS

## 2015-06-22 MED ORDER — FENTANYL CITRATE (PF) 100 MCG/2ML IJ SOLN
INTRAMUSCULAR | Status: DC | PRN
  Administered 2015-06-22 (×2): 50 ug via INTRAVENOUS

## 2015-06-22 MED ORDER — ROPIVACAINE HCL 5 MG/ML IJ SOLN
INTRAMUSCULAR | Status: DC | PRN
  Administered 2015-06-22: 20 mL via EPIDURAL

## 2015-06-22 MED ORDER — CEFAZOLIN SODIUM-DEXTROSE 2-3 GM-% IV SOLR
INTRAVENOUS | Status: AC
  Filled 2015-06-22: qty 50

## 2015-06-22 MED ORDER — SUCCINYLCHOLINE CHLORIDE 20 MG/ML IJ SOLN
INTRAMUSCULAR | Status: DC | PRN
  Administered 2015-06-22: 100 mg via INTRAVENOUS

## 2015-06-22 MED ORDER — EPHEDRINE SULFATE 50 MG/ML IJ SOLN
INTRAMUSCULAR | Status: DC | PRN
  Administered 2015-06-22 (×3): 5 mg via INTRAVENOUS
  Administered 2015-06-22: 15 mg via INTRAVENOUS

## 2015-06-22 MED ORDER — ONDANSETRON HCL 4 MG PO TABS: 4.0000 mg | ORAL_TABLET | Freq: Four times a day (QID) | ORAL | Status: DC | PRN

## 2015-06-22 MED ORDER — ONDANSETRON HCL 4 MG/2ML IJ SOLN: 4.0000 mg | Freq: Once | INTRAMUSCULAR | Status: DC | PRN

## 2015-06-22 MED ORDER — NEOSTIGMINE METHYLSULFATE 10 MG/10ML IV SOLN
INTRAVENOUS | Status: DC | PRN
  Administered 2015-06-22: 3 mg via INTRAVENOUS

## 2015-06-22 MED ORDER — OXYCODONE HCL 5 MG PO TABS: 5.0000 mg | ORAL_TABLET | ORAL | Status: DC | PRN

## 2015-06-22 MED ORDER — ONDANSETRON HCL 4 MG/2ML IJ SOLN: 4.0000 mg | Freq: Four times a day (QID) | INTRAMUSCULAR | Status: DC | PRN

## 2015-06-22 MED ORDER — ACETAMINOPHEN 10 MG/ML IV SOLN
INTRAVENOUS | Status: AC
  Filled 2015-06-22: qty 100

## 2015-06-22 MED ORDER — ROPIVACAINE HCL 5 MG/ML IJ SOLN
INTRAMUSCULAR | Status: AC
  Filled 2015-06-22: qty 20

## 2015-06-22 MED ORDER — ROCURONIUM BROMIDE 100 MG/10ML IV SOLN
INTRAVENOUS | Status: DC | PRN
  Administered 2015-06-22: 20 mg via INTRAVENOUS
  Administered 2015-06-22: 10 mg via INTRAVENOUS

## 2015-06-22 MED ORDER — EPINEPHRINE HCL 1 MG/ML IJ SOLN
INTRAMUSCULAR | Status: AC
  Filled 2015-06-22: qty 1

## 2015-06-22 MED ORDER — ACETAMINOPHEN 10 MG/ML IV SOLN
INTRAVENOUS | Status: DC | PRN
  Administered 2015-06-22: 1000 mg via INTRAVENOUS

## 2015-06-22 MED ORDER — BUPIVACAINE-EPINEPHRINE (PF) 0.5% -1:200000 IJ SOLN
INTRAMUSCULAR | Status: DC | PRN
  Administered 2015-06-22: 15 mL via PERINEURAL

## 2015-06-22 MED ORDER — GLYCOPYRROLATE 0.2 MG/ML IJ SOLN
INTRAMUSCULAR | Status: DC | PRN
  Administered 2015-06-22: 0.6 mg via INTRAVENOUS
  Administered 2015-06-22: 0.2 mg via INTRAVENOUS

## 2015-06-22 MED ORDER — BUPIVACAINE-EPINEPHRINE (PF) 0.5% -1:200000 IJ SOLN
INTRAMUSCULAR | Status: AC
  Filled 2015-06-22: qty 30

## 2015-06-22 MED ORDER — METOCLOPRAMIDE HCL 5 MG/ML IJ SOLN: 5.0000 mg | Freq: Three times a day (TID) | INTRAMUSCULAR | Status: DC | PRN

## 2015-06-22 MED ORDER — MIDAZOLAM HCL 2 MG/2ML IJ SOLN
INTRAMUSCULAR | Status: DC | PRN
  Administered 2015-06-22: 2 mg via INTRAVENOUS

## 2015-06-22 MED ORDER — NEOMYCIN-POLYMYXIN B GU 40-200000 IR SOLN
Status: DC | PRN
  Administered 2015-06-22: 2 mL

## 2015-06-22 MED ORDER — PHENYLEPHRINE HCL 10 MG/ML IJ SOLN
INTRAMUSCULAR | Status: DC | PRN
  Administered 2015-06-22 (×2): 50 ug via INTRAVENOUS
  Administered 2015-06-22 (×2): 100 ug via INTRAVENOUS
  Administered 2015-06-22: 50 ug via INTRAVENOUS

## 2015-06-22 MED ORDER — LACTATED RINGERS IV SOLN: INTRAVENOUS | Status: DC

## 2015-06-22 MED ORDER — SODIUM CHLORIDE 0.9 % IR SOLN
Status: DC | PRN
  Administered 2015-06-22: 2 mL

## 2015-06-22 MED ORDER — LIDOCAINE HCL (CARDIAC) 20 MG/ML IV SOLN
INTRAVENOUS | Status: DC | PRN
  Administered 2015-06-22: 60 mg via INTRAVENOUS

## 2015-06-22 MED ORDER — DEXAMETHASONE SODIUM PHOSPHATE 4 MG/ML IJ SOLN
INTRAMUSCULAR | Status: DC | PRN
  Administered 2015-06-22: 5 mg via INTRAVENOUS

## 2015-06-22 MED ORDER — METOCLOPRAMIDE HCL 10 MG PO TABS: 5.0000 mg | ORAL_TABLET | Freq: Three times a day (TID) | ORAL | Status: DC | PRN

## 2015-06-22 SURGICAL SUPPLY — 51 items
ANCHOR JUGGERKNOT WTAP NDL 2.9 (Anchor) ×2 IMPLANT
BIT DRILL JUGRKNT W/NDL BIT2.9 (DRILL) ×1 IMPLANT
BLADE FULL RADIUS 3.5 (BLADE) IMPLANT
BLADE SHAVER 4.5X7 STR FR (MISCELLANEOUS) ×2 IMPLANT
BUR ACROMIONIZER 4.0 (BURR) ×2 IMPLANT
BUR BR 5.5 WIDE MOUTH (BURR) IMPLANT
CANNULA 8.5X75 THRED (CANNULA) ×2 IMPLANT
CANNULA SHAVER 8MMX76MM (CANNULA) IMPLANT
CHLORAPREP W/TINT 26ML (MISCELLANEOUS) ×2 IMPLANT
COOLER POLAR GLACIER W/PUMP (MISCELLANEOUS) ×2 IMPLANT
DRAPE IMP U-DRAPE 54X76 (DRAPES) ×4 IMPLANT
DRAPE SURG 17X11 SM STRL (DRAPES) IMPLANT
DRILL JUGGERKNOT W/NDL BIT 2.9 (DRILL) ×2
GAUZE PETRO XEROFOAM 1X8 (MISCELLANEOUS) ×2 IMPLANT
GAUZE SPONGE 4X4 12PLY STRL (GAUZE/BANDAGES/DRESSINGS) ×2 IMPLANT
GLOVE BIO SURGEON STRL SZ7.5 (GLOVE) ×4 IMPLANT
GLOVE BIO SURGEON STRL SZ8 (GLOVE) ×4 IMPLANT
GLOVE BIOGEL PI IND STRL 8 (GLOVE) ×1 IMPLANT
GLOVE BIOGEL PI INDICATOR 8 (GLOVE) ×1
GLOVE INDICATOR 8.0 STRL GRN (GLOVE) ×2 IMPLANT
GOWN STRL REUS W/ TWL LRG LVL3 (GOWN DISPOSABLE) ×2 IMPLANT
GOWN STRL REUS W/ TWL XL LVL3 (GOWN DISPOSABLE) ×1 IMPLANT
GOWN STRL REUS W/TWL LRG LVL3 (GOWN DISPOSABLE) ×2
GOWN STRL REUS W/TWL XL LVL3 (GOWN DISPOSABLE) ×1
GRASPER SUT 15 45D LOW PRO (SUTURE) IMPLANT
IV LACTATED RINGER IRRG 3000ML (IV SOLUTION) ×2
IV LR IRRIG 3000ML ARTHROMATIC (IV SOLUTION) ×2 IMPLANT
MANIFOLD NEPTUNE II (INSTRUMENTS) ×2 IMPLANT
MASK FACE SPIDER DISP (MASK) ×2 IMPLANT
MAT BLUE FLOOR 46X72 FLO (MISCELLANEOUS) ×2 IMPLANT
NDL MAYO CATGUT SZ4 (NEEDLE) ×2 IMPLANT
NEEDLE MAYO 6 CRC TAPER PT (NEEDLE) ×2 IMPLANT
NEEDLE MAYO CATGUT SZ 1.5 (NEEDLE) ×2
NEEDLE MAYO CATGUT SZ 2 (NEEDLE) ×1 IMPLANT
NEEDLE REVERSE CUT 1/2 CRC (NEEDLE) ×2 IMPLANT
PACK ARTHROSCOPY SHOULDER (MISCELLANEOUS) ×2 IMPLANT
PAD GROUND ADULT SPLIT (MISCELLANEOUS) ×2 IMPLANT
PAD WRAPON POLAR SHDR UNIV (MISCELLANEOUS) ×1 IMPLANT
SLING ARM LRG DEEP (SOFTGOODS) IMPLANT
SLING ULTRA II LG (MISCELLANEOUS) IMPLANT
STAPLER SKIN PROX 35W (STAPLE) ×2 IMPLANT
STRAP SAFETY BODY (MISCELLANEOUS) ×2 IMPLANT
SUT ETHIBOND 0 MO6 C/R (SUTURE) ×2 IMPLANT
SUT PROLENE 4 0 PS 2 18 (SUTURE) ×2 IMPLANT
SUT VIC AB 2-0 CT1 27 (SUTURE) ×2
SUT VIC AB 2-0 CT1 TAPERPNT 27 (SUTURE) ×2 IMPLANT
TAPE MICROFOAM 4IN (TAPE) ×2 IMPLANT
TUBING ARTHRO INFLOW-ONLY STRL (TUBING) ×2 IMPLANT
TUBING CONNECTING 10 (TUBING) ×2 IMPLANT
WAND HAND CNTRL MULTIVAC 90 (MISCELLANEOUS) ×2 IMPLANT
WRAPON POLAR PAD SHDR UNIV (MISCELLANEOUS) ×2

## 2015-06-22 NOTE — Anesthesia Postprocedure Evaluation (Signed)
  Anesthesia Post-op Note  Patient: Bailey Brooks  Procedure(s) Performed: Procedure(s): ARTHROSCOPY SHOULDER, parital repair of rotator cuff, biceps tenodesis, decompression and debridement (Right)  Anesthesia type:General  Patient location: PACU  Post pain: Pain level controlled  Post assessment: Post-op Vital signs reviewed, Patient's Cardiovascular Status Stable, Respiratory Function Stable, Patent Airway and No signs of Nausea or vomiting  Post vital signs: Reviewed and stable  Last Vitals:  Filed Vitals:   06/22/15 1331  BP: 148/59  Pulse: 64  Temp: 36.1 C  Resp:     Level of consciousness: awake, alert  and patient cooperative  Complications: No apparent anesthesia complications

## 2015-06-22 NOTE — Anesthesia Preprocedure Evaluation (Signed)
Anesthesia Evaluation  Patient identified by MRN, date of birth, ID band Patient awake    Reviewed: Allergy & Precautions, NPO status , Patient's Chart, lab work & pertinent test results  History of Anesthesia Complications Negative for: history of anesthetic complications  Airway Mallampati: II       Dental no notable dental hx.    Pulmonary neg pulmonary ROS,    Pulmonary exam normal       Cardiovascular negative cardio ROS Normal cardiovascular exam    Neuro/Psych Depression negative neurological ROS     GI/Hepatic negative GI ROS,   Endo/Other  Hypothyroidism   Renal/GU negative Renal ROS  negative genitourinary   Musculoskeletal negative musculoskeletal ROS (+)   Abdominal (+) + obese,   Peds negative pediatric ROS (+)  Hematology negative hematology ROS (+)   Anesthesia Other Findings   Reproductive/Obstetrics                             Anesthesia Physical Anesthesia Plan  ASA: III  Anesthesia Plan: General   Post-op Pain Management:    Induction: Intravenous  Airway Management Planned: Oral ETT  Additional Equipment:   Intra-op Plan:   Post-operative Plan: Extubation in OR  Informed Consent: I have reviewed the patients History and Physical, chart, labs and discussed the procedure including the risks, benefits and alternatives for the proposed anesthesia with the patient or authorized representative who has indicated his/her understanding and acceptance.     Plan Discussed with: CRNA  Anesthesia Plan Comments:         Anesthesia Quick Evaluation

## 2015-06-22 NOTE — Anesthesia Procedure Notes (Addendum)
Anesthesia Regional Block:  Interscalene brachial plexus block  Pre-Anesthetic Checklist: ,, timeout performed, Correct Patient, Correct Site, Correct Laterality, Correct Procedure, Correct Position, site marked, Risks and benefits discussed,  Surgical consent,  Pre-op evaluation,  At surgeon's request and post-op pain management  Laterality: Right  Prep: alcohol swabs       Needles:  Injection technique: Single-shot  Needle Type: Stimulator Needle - 40     Needle Length: 5cm 5 cm Needle Gauge: 22 and 22 G  Needle insertion depth: 2.5 cm   Additional Needles:  Procedures: nerve stimulator  Motor weakness within 4 minutes. Interscalene brachial plexus block  Nerve Stimulator or Paresthesia:  Response: 0.4 mA, 1 ms, 2.5 cm  Additional Responses:   Narrative:  Start time: 06/22/2015 11:13 AM End time: 06/22/2015 11:19 AM  Performed by: Personally  Anesthesiologist: Marline Backbone F   Procedure Name: Intubation Date/Time: 06/22/2015 11:25 AM Performed by: Aline Brochure Pre-anesthesia Checklist: Patient identified, Emergency Drugs available, Suction available, Patient being monitored and Timeout performed Patient Re-evaluated:Patient Re-evaluated prior to inductionOxygen Delivery Method: Circle system utilized Preoxygenation: Pre-oxygenation with 100% oxygen Intubation Type: IV induction Ventilation: Oral airway inserted - appropriate to patient size and Mask ventilation with difficulty Laryngoscope Size: Mac and 3 Grade View: Grade II Tube type: Oral Tube size: 7.0 mm Number of attempts: 1 Airway Equipment and Method: Patient positioned with wedge pillow and Stylet Placement Confirmation: ETT inserted through vocal cords under direct vision,  positive ETCO2 and breath sounds checked- equal and bilateral Secured at: 19 cm Tube secured with: Tape Dental Injury: Teeth and Oropharynx as per pre-operative assessment

## 2015-06-22 NOTE — Op Note (Signed)
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  Procedure: Extensive arthroscopic debridement, arthroscopic subacromial decompression, mini-open repair of subscapularis tendon, and mini-open biceps tenodesis, right shoulder.  Anesthesia: General endotracheal with interscalene block placed preoperatively by the anesthesiologist.  Surgeon:   Pascal Lux, MD  Assistant:   Cameron Proud, PA-C  Findings: As above. There was an extensive tear of the rotator cuff involving all of the supraspinatus and infraspinatus tendons, as well as the superior portion of the subscapularis tendon. Grade 1-2 chondromalacial changes were noted involving both the humeral head and central portion of the glenoid. The labrum was in satisfactory condition. The biceps tendon demonstrated significant tendinopathy changes with partial tearing of the intra-articular portion of the long head.  Complications: None  Fluids:   700 cc  Estimated blood loss: 10 cc  Tourniquet time: None  Drains: None  Closure: Staples   Brief clinical note: The patient is a 64 year old female with a 3 week history of increased right shoulder pain and inability to elevate her arm following an injury in which she tripped on a sidewalk at [facility] and fell forward onto her outstretched right hand. The patient's symptoms have persisted despite medications, activity modification, etc. The patient's history and examination are consistent with a large rotator cuff tear. These findings were confirmed by MRI scan. The patient presents at this time for arthroscopy, debridement, and attempted repair of the massive rotator cuff tear.  Procedure: The patient was brought into the operating room and lain in the  supine position. After adequate IV sedation was achieved, the patient underwent placement of an interscalene block by the anesthesiologist. The patient then underwent general endotracheal intubation and anesthesia before being repositioned in the beach chair position using the beach chair positioner. The right shoulder and upper extremity were prepped with ChloraPrep solution before being draped sterilely. Preoperative antibiotics were administered. A timeout was performed to confirm the proper side before the expected portal sites and incision site were injected with 0.5% Sensorcaine with epinephrine. A posterior portal was created and the glenohumeral joint thoroughly inspected with the findings as described above. An anterior portal was created using an outside-in technique. The labrum and rotator cuff were further probed, again confirming the above-noted findings. The rotator cuff tear involved all of the supraspinatus and infraspinatus tendons with retraction back to the glenoid. The teres minor appeared to be intact. The superior portion of the subscapularis tendon also was torn. Using the 3.5 mm full-radius resector, extensive areas of synovitis were debrided, as were the margins of the rotator cuff tear. In addition, some of the frayed portions of the subscapularis tendon also were debrided. The ArthroCare wand was used to release the long head of the biceps tendon from its labral attachment superiorly. The ArthroCare wand also was used to obtain hemostasis. The instruments were removed from the joint after suctioning the excess fluid.  The camera was repositioned through the posterior portal into the subacromial space. A separate lateral portal was created using an outside-in technique. The 3.5 full-radius resector was introduced and used to perform a subtotal bursectomy. The ArthroCare wand was then inserted and used to remove the periosteal tissue off the undersurface of the anterior third of the  acromion as well as to recess the coracoacromial ligament from its attachment along the  anterior and lateral margins of the acromion. The 4.0 mm acromionizing bur was introduced and used to complete the decompression by removing the undersurface of the anterior third of the acromion. The full radius resector was reintroduced to remove any residual bony debris before the ArthroCare wand was reintroduced to obtain hemostasis. The instruments were then removed from the subacromial space after suctioning the excess fluid.  An approximately 4-5 cm incision was made over the anterolateral aspect of the shoulder beginning at the anterolateral corner of the acromion and extending distally in line with the bicipital groove. This incision was carried down through the subcutaneous tissues to expose the deltoid fascia. The raphae between the anterior and middle thirds was identified and this plane developed to provide access into the subacromial space. Additional bursal tissues were debrided sharply using Metzenbaum scissors. The rotator cuff tear was readily identified. Despite extensive dissection anteriorly, superiorly, and posteriorly, as well as attempted releases of the rotator cuff tendons, the supraspinatus and infraspinatus tendons could not be identified, much less mobilized sufficiently to permit any attempt at repair. The superior insertional fibers of the subscapularis tendon were repaired using a single Biomet 2.9 mm JuggerKnot anchor in the hope that this would at least restore the anterior-posterior force couple, thereby improving her function postoperatively. A soft tissue biceps tenodesis was performed using two #0 Ethibond interrupted sutures to secure the tendon to the adjacent soft tissues without opening the bicipital sheath.  The wound was copiously irrigated with sterile saline solution before the deltoid raphae was reapproximated using 2-0 Vicryl interrupted sutures. The subcutaneous tissues were  closed in two layers using 2-0 Vicryl interrupted sutures before the skin was closed using staples. The portal sites also were closed using staples. A sterile bulky dressing was applied to the shoulder before the arm was placed into a shoulder immobilizer. The patient was then awakened, extubated, and returned to the recovery room in satisfactory condition after tolerating the procedure well.

## 2015-06-22 NOTE — Transfer of Care (Signed)
Immediate Anesthesia Transfer of Care Note  Patient: Bailey Brooks  Procedure(s) Performed: Procedure(s): ARTHROSCOPY SHOULDER, parital repair of rotator cuff, biceps tenodesis, decompression and debridement (Right)  Patient Location: PACU  Anesthesia Type:GA combined with regional for post-op pain  Level of Consciousness: awake, alert  and oriented  Airway & Oxygen Therapy: Patient Spontanous Breathing and Patient connected to face mask oxygen  Post-op Assessment: Report given to RN and Post -op Vital signs reviewed and stable  Post vital signs: Reviewed and stable  Last Vitals:  Filed Vitals:   06/22/15 1331  BP: 148/59  Pulse: 64  Temp: 36.1 C  Resp:     Complications: No apparent anesthesia complications

## 2015-06-22 NOTE — Discharge Instructions (Addendum)
Keep dressing dry and intact.  May shower after dressing changed on post-op day #4 (Saturday).  Cover staples/sutures with Band-Aids after drying off. Apply ice frequently to shoulder or use Polar Pack. Keep shoulder immobilizer on at all times except may remove for bathing purposes. Follow-up in 10-14 days or as scheduled   AMBULATORY SURGERY  DISCHARGE INSTRUCTIONS   1) The drugs that you were given will stay in your system until tomorrow so for the next 24 hours you should not:  A) Drive an automobile B) Make any legal decisions C) Drink any alcoholic beverage   2) You may resume regular meals tomorrow.  Today it is better to start with liquids and gradually work up to solid foods.  You may eat anything you prefer, but it is better to start with liquids, then soup and crackers, and gradually work up to solid foods.   3) Please notify your doctor immediately if you have any unusual bleeding, trouble breathing, redness and pain at the surgery site, drainage, fever, or pain not relieved by medication.    4) Additional Instructions:        Please contact your physician with any problems or Same Day Surgery at [phone], Monday through Friday 6 am to 4 pm, or [place] at [facility] number at [phone].

## 2015-06-22 NOTE — H&P (Signed)
Paper H&P to be scanned into permanent record. H&P reviewed. No changes. 

## 2015-06-24 ENCOUNTER — Encounter: Payer: Self-pay | Admitting: Surgery

## 2015-06-24 DIAGNOSIS — S46909A Unspecified injury of unspecified muscle, fascia and tendon at shoulder and upper arm level, unspecified arm, initial encounter: Secondary | ICD-10-CM | POA: Insufficient documentation

## 2015-06-30 ENCOUNTER — Ambulatory Visit: Payer: BLUE CROSS/BLUE SHIELD | Admitting: Internal Medicine

## 2015-07-22 ENCOUNTER — Inpatient Hospital Stay: Payer: BLUE CROSS/BLUE SHIELD | Attending: Family Medicine

## 2015-07-22 DIAGNOSIS — Z8572 Personal history of non-Hodgkin lymphomas: Secondary | ICD-10-CM

## 2015-07-22 LAB — HEPATIC FUNCTION PANEL
ALT: 16 U/L (ref 14–54)
AST: 20 U/L (ref 15–41)
Albumin: 3.8 g/dL (ref 3.5–5.0)
Alkaline Phosphatase: 78 U/L (ref 38–126)
Bilirubin, Direct: <0.1 mg/dL — ABNORMAL LOW (ref 0.1–0.5)
Total Bilirubin: 0.7 mg/dL (ref 0.3–1.2)
Total Protein: 6.9 g/dL (ref 6.5–8.1)

## 2015-07-22 LAB — CBC WITH DIFFERENTIAL/PLATELET
Basophils Absolute: 0 10*3/uL (ref 0–0.1)
Basophils Relative: 0 %
Eosinophils Absolute: 0.1 10*3/uL (ref 0–0.7)
Eosinophils Relative: 1 %
HCT: 42.1 % (ref 35.0–47.0)
Hemoglobin: 14.2 g/dL (ref 12.0–16.0)
LYMPHS PCT: 32 %
Lymphs Abs: 1.4 10*3/uL (ref 1.0–3.6)
MCH: 30.2 pg (ref 26.0–34.0)
MCHC: 33.8 g/dL (ref 32.0–36.0)
MCV: 89.3 fL (ref 80.0–100.0)
Monocytes Absolute: 0.4 10*3/uL (ref 0.2–0.9)
Monocytes Relative: 9 %
Neutro Abs: 2.6 10*3/uL (ref 1.4–6.5)
Neutrophils Relative %: 58 %
Platelets: 165 10*3/uL (ref 150–440)
RBC: 4.72 MIL/uL (ref 3.80–5.20)
RDW: 14.5 % (ref 11.5–14.5)
WBC: 4.5 10*3/uL (ref 3.6–11.0)

## 2015-07-22 LAB — LACTATE DEHYDROGENASE: LDH: 134 U/L (ref 98–192)

## 2015-07-22 LAB — CREATININE, SERUM
Creatinine, Ser: 0.83 mg/dL (ref 0.44–1.00)
GFR calc Af Amer: >60 mL/min (ref >60)
GFR calc non Af Amer: >60 mL/min (ref >60)

## 2015-07-26 ENCOUNTER — Other Ambulatory Visit: Payer: Self-pay | Admitting: Internal Medicine

## 2015-07-27 ENCOUNTER — Encounter: Payer: Self-pay | Admitting: *Deleted

## 2015-07-28 ENCOUNTER — Inpatient Hospital Stay (HOSPITAL_BASED_OUTPATIENT_CLINIC_OR_DEPARTMENT_OTHER): Payer: BLUE CROSS/BLUE SHIELD | Admitting: Obstetrics and Gynecology

## 2015-07-28 VITALS — BP 164/82 | HR 60 | Temp 97.0°F | Resp 18 | Wt 200.6 lb

## 2015-07-28 DIAGNOSIS — C4499 Other specified malignant neoplasm of skin, unspecified: Secondary | ICD-10-CM | POA: Diagnosis not present

## 2015-07-28 NOTE — Addendum Note (Signed)
Addended by: Gillis Ends on: 07/28/2015 09:32 AM   Modules accepted: Level of Service

## 2015-07-28 NOTE — Progress Notes (Addendum)
Gynecologic Oncology Visit   Referring Provider: Dr. Kenton Kingfisher.  Chief Concern: paget's disease of the vulva  Subjective:  Bailey Brooks is a 64 y.o. female who is seen in consultation from Dr. Kenton Kingfisher for paget's disease of the vulva. At her last visit she presented with complaints of pain and was diagnosed with a vulvar laceration. This was managed conservatively.    Gynecologic Oncology History: Bailey Brooks has a history of localized vulvar Paget's disease.   04/2013    vulvar biopsy revealed Paget's disease               WLE, additional margins resected for positive disease on Frozen evaluation.   Part A: VULVA, VAGINAL MARGIN:  - POSITIVE FOR EXTRAMAMMARY PAGET'S DISEASE.  Marland Kitchen  Part B: VULVA, POSTERIOR MARGIN:  - POSITIVE FOR EXTRAMAMMARY PAGET'S DISEASE.  Marland Kitchen  Part C: VULVA, RIGHT, PARTIAL VULVECTOMY:  - EXTRAMAMMARY PAGET'S DISEASE.  Marland Kitchen  Part D: VULVA, NEW VAGINAL MARGIN:  - POSITIVE FOR EXTRAMAMMARY PAGET'S DISEASE.   Problem List: Patient Active Problem List   Diagnosis Date Noted  . Elevated BP 05/25/2015  . Adjustment disorder with mixed anxiety and depressed mood 05/25/2015  . Knee pain, right 05/25/2015  . 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 Routine general medical examination at a health care facility 02/27/2013  . Eczema 02/27/2013  . Muscle cramps 11/13/2012  . Screening for breast cancer 11/13/2012  . Personal history of lymphoma 02/20/2012  . Hypothyroidism 02/20/2012  . Obesity 02/20/2012  . Hyperlipidemia 02/20/2012    Past Medical History: Past Medical History  Diagnosis Date  . 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 Madelynn Done s/p RCHOP and methotrexate, c/b renal failure  . Hypothyroidism   . Paget disease, extra mammary     vulva, s/p resection 2014, Dr. Sabra Heck  . Depression   . Allergic rhinitis   . CAD (coronary artery disease)   . History of CVA (cerebrovascular accident) September 2001  . Arthritis   .  Peripheral neuropathy   . Sleep apnea   . 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 H/O stem cell transplant Jul 2012  . Large cell lymphoma   . History of chemotherapy 22-Oct-2010  . Hypertension   . Hypercholesterolemia     Past Surgical History: Past Surgical History  Procedure Laterality Date  . Tonsillectomy  1954  . Limbal stem cell transplant  2012  . Liver biopsy      stage 4B large Bcell lymphoma  . Burr hole w/ placement ommaya reservoir    . Breast biopsy  2002    Neg - AT [facility]  . Abdominal hysterectomy  1985    Hysterectomy-partial  . Vulvectomy    . Shoulder arthroscopy Right 06/22/2015    Procedure: ARTHROSCOPY SHOULDER, parital repair of rotator cuff, biceps tenodesis, decompression and debridement;  Surgeon: Corky Mull, MD;  Location: [facility] ORS;  Service: Orthopedics;  Laterality: Right;  . Cholecystectomy  2010  . Hand surgery      right hand  . Hip surgery Right 1997  . Insertion central venous access device w/ subcutaneous port  2011    Port a Cath: Right chest Double Lumen, 04-Nov-2010    Past Gynecologic History:  S/p hysterectomy  OB History:  OB History  No data available    Family History: Family History  Problem Relation Age of Onset  . Diabetes Mother   .  Hypertension Mother   . Melanoma Father 55    Melanoma  . Kidney disease Sister     Kidney removed   . Prostate cancer Brother     Prostate - dx in 24's  . Hearing loss Maternal Aunt   . Hearing loss Maternal Grandfather   . Heart disease Brother 63    MI & DVT in heart    Social History: Social History   Social History  . Marital Status: Married    Spouse Name: N/A  . Number of Children: 2  . Years of Education: N/A   Occupational History  . 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 Smoking status: Never Smoker   . Smokeless tobacco: Never Used  . Alcohol Use: No     Comment:  former user-no current use  . Drug Use: No  . Sexual Activity:    Partners: Male   Other Topics Concern  . Not on file   Social History Narrative   Lives in [place] with husband, has 2 children boys.  No pets. 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    Allergies: Allergies  Allergen Reactions  . Adhesive [Tape] Other (See Comments)    Including band-aide  . Erythromycin Nausea And Vomiting  . Iodine Swelling    IV iodine  . Morphine And Related Nausea And Vomiting    Hallucinations   . Sulfa Antibiotics Other (See Comments)    Dizzy/Fainting    Current Medications: Current Outpatient Prescriptions  Medication Sig Dispense Refill  . Cholecalciferol (VITAMIN D) 2000 UNITS tablet Take 2,000 Units by mouth 2 (two) times daily.    . lansoprazole (PREVACID) 30 MG capsule take 1 capsule by mouth once daily 30 capsule 5  . levothyroxine (SYNTHROID, LEVOTHROID) 75 MCG tablet take 1 tablet by mouth once daily BEFORE BREAKFAST 30 tablet 6  . magnesium chloride (SLOW-MAG) 64 MG TBEC SR tablet Take 1 tablet by mouth 2 (two) times daily.    Marland Kitchen oxyCODONE (ROXICODONE) 5 MG immediate release tablet Take 1-2 tablets (5-10 mg total) by mouth every 4 (four) hours as needed for severe pain. 50 tablet 0  . potassium chloride (K-DUR) 10 MEQ tablet take 1 tablet by mouth twice a day 180 tablet 1  . valACYclovir (VALTREX) 500 MG tablet Take 500 mg by mouth 2 (two) times daily.     No current facility-administered medications for this visit.    Review of Systems General: no complaints  HEENT: no complaints  Lungs: no complaints  Cardiac: no complaints  GI: no complaints  GU: no complaints  Musculoskeletal: no complaints  Extremities: no complaints  Skin: no complaints  Neuro: no complaints  Endocrine: no complaints  Psych: no complaints       Objective:  Physical Examination:  BP 164/82 mmHg  Pulse 60  Temp(Src) 97 F (36.1 C) (Tympanic)  Resp 18  Wt 200 lb 9.9 oz (91 kg)   Body mass index is  34.42 kg/(m^2).    ECOG Performance Status: 1 - Symptomatic but completely ambulatory  General appearance: alert, cooperative and appears stated age HEENT:PERRLA, extra ocular movement intact and sclera clear, anicteric Lymph node survey: non-palpable, inguinal Extremities: Right arm in a brace. BLE normal  Neurological exam reveals alert, oriented, normal speech, no focal findings  Pelvic: exam chaperoned by nurse;  Vulva: vulvar scars after resection, erythema and slight raised mucosa at the vaginal vulvar junction at 7 o'clock on the right concerning for  recurrent Paget's disease. There is a border of pale epithelium that probably represents normal epithelium. On the left vulva there is the healed vertical laceration scar approximately 3 cm long and .5 cm in width- there is mild AW epithelium overlying the scar.; Vagina: normal vagina; Adnexa: normal adnexa in size, nontender and no masses; Uterus and Cervix: surgically absent, vaginal cuff well healed  The risks and benefits of the procedure were reviewed and informed consent obtained. Time out was performed. The patient received pre-procedure teaching and expressed understanding. The post-procedure instructions were reviewed with the patient and she expressed understanding. The patient does not have any barriers to learning.  Anesthesia was obtained with 2% lidocaine, 3 cc. The site was cleansed with Betadine.Biopsy of the right vulva at 7 o'clock performed. Hemostasis excellent with AgNO3. Patient reassessed after procedure and in stable condition. No complications.  Lab Review Labs on site today: none  Radiologic Imaging: n/a    Assessment:  Bailey Brooks is a 64 y.o. female diagnosed with h/o Paget's disease of the vulva s/p WLE with positive margins and vulvar lesion concerning for recurrence.    Plan:   Problem List Items Addressed This Visit    None    Visit Diagnoses    Extramammary Paget disease    -  Primary     Relevant Orders    Surgical pathology       We will follow up biopsies and determine management. If negative follow up in 6 months.   Information regarding calorie counting and exercise for weight loss provided. She will follow up with her PCP regarding hypertension.   Gillis Ends, MD    CC:  Dr. Kenton Kingfisher.  [address], [address] [place], [place] [address] [phone]

## 2015-07-28 NOTE — Patient Instructions (Signed)
Vulvar Biopsy, Care After Refer to this sheet in the next few weeks. These instructions provide you with information on caring for yourself after your procedure. Your health care provider may also give you more specific instructions. Your treatment has been planned according to current medical practices, but problems sometimes occur. Call your health care provider if you have any problems or questions after your procedure.   WHAT TO EXPECT AFTER THE PROCEDURE After your procedure, it is typical to have the following:  Pelvic and vulvar discomfort and pain  Minimal bleeding from the wound  HOME CARE INSTRUCTIONS  It takes 1-4 weeks to for the biopsy site to heal. Make sure you follow all your health care provider's instructions. Home care instructions may include:  Take over the counter pain medicines as needed.   You may take sitz baths or showers to clean wound, rinse, blot and blow dry and also perform after every bowel movement to keep the area clean  If you are having difficulty urinating due to pain you can try urinating in the bath tub or shower, or use spray bottle to dilute urine and this will decrease pain.   Apply xylocaine gel to area as need for pain.    SEEK MEDICAL CARE IF:   You have chills or fever.  You feel dizzy or light-headed.   You have pain or bleeding when you urinate.   You have persistent nausea and vomiting.    You you have heavy bleeding  Your pain medicine is not helping.   SEEK IMMEDIATE MEDICAL CARE IF:   You have a fever and your symptoms suddenly get worse.  You have severe abdominal pain.  You have chest pain.  You have shortness of breath.  You faint.  You have pain, swelling, or redness of your leg.  You have very heavy vaginal or vulvar bleeding with blood clots.  Exercise to Lose Weight Exercise and a healthy diet may help you lose weight. Your doctor may suggest specific exercises. EXERCISE IDEAS AND TIPS  Choose  low-cost things you enjoy doing, such as walking, bicycling, or exercising to workout videos.  Take stairs instead of the elevator.  Walk during your lunch break.  Park your car further away from work or school.  Go to a gym or an exercise class.  Start with 5 to 10 minutes of exercise each day. Build up to 30 minutes of exercise 4 to 6 days a week.  Wear shoes with good support and comfortable clothes.  Stretch before and after working out.  Work out until you breathe harder and your heart beats faster.  Drink extra water when you exercise.  Do not do so much that you hurt yourself, feel dizzy, or get very short of breath. Exercises that burn about 150 calories:  Running 1  miles in 15 minutes.  Playing volleyball for 45 to 60 minutes.  Washing and waxing a car for 45 to 60 minutes.  Playing touch football for 45 minutes.  Walking 1  miles in 35 minutes.  Pushing a stroller 1  miles in 30 minutes.  Playing basketball for 30 minutes.  Raking leaves for 30 minutes.  Bicycling 5 miles in 30 minutes.  Walking 2 miles in 30 minutes.  Dancing for 30 minutes.  Shoveling snow for 15 minutes.  Swimming laps for 20 minutes.  Walking up stairs for 15 minutes.  Bicycling 4 miles in 15 minutes.  Gardening for 30 to 45 minutes.  Jumping rope for  15 minutes.  Washing windows or floors for 45 to 60 minutes. Document Released: 12/23/2010 Document Revised: 02/12/2012 Document Reviewed: 12/23/2010 [facility] Patient Information 2015 [place], [place]. This information is not intended to replace advice given to you by your health care provider. Make sure you discuss any questions you have with your health care provider. Calorie Counting for Weight Loss Calories are energy you get from the things you eat and drink. Your body uses this energy to keep you going throughout the day. The number of calories you eat affects your weight. When you eat more calories than your body  needs, your body stores the extra calories as fat. When you eat fewer calories than your body needs, your body burns fat to get the energy it needs. Calorie counting means keeping track of how many calories you eat and drink each day. If you make sure to eat fewer calories than your body needs, you should lose weight. In order for calorie counting to work, you will need to eat the number of calories that are right for you in a day to lose a healthy amount of weight per week. A healthy amount of weight to lose per week is usually 1-2 lb (0.5-0.9 kg). A dietitian can determine how many calories you need in a day and give you suggestions on how to reach your calorie goal.  WHAT IS MY MY PLAN? My goal is to have __________ calories per day.  If I have this many calories per day, I should lose around __________ pounds per week. WHAT DO I NEED TO KNOW ABOUT CALORIE COUNTING? In order to meet your daily calorie goal, you will need to:  Find out how many calories are in each food you would like to eat. Try to do this before you eat.  Decide how much of the food you can eat.  Write down what you ate and how many calories it had. Doing this is called keeping a food log. WHERE DO I FIND CALORIE INFORMATION? The number of calories in a food can be found on a Nutrition Facts label. Note that all the information on a label is based on a specific serving of the food. If a food does not have a Nutrition Facts label, try to look up the calories online or ask your dietitian for help. HOW DO I DECIDE HOW MUCH TO EAT? To decide how much of the food you can eat, you will need to consider both the number of calories in one serving and the size of one serving. This information can be found on the Nutrition Facts label. If a food does not have a Nutrition Facts label, look up the information online or ask your dietitian for help. Remember that calories are listed per serving. If you choose to have more than one serving of  a food, you will have to multiply the calories per serving by the amount of servings you plan to eat. For example, the label on a package of bread might say that a serving size is 1 slice and that there are 90 calories in a serving. If you eat 1 slice, you will have eaten 90 calories. If you eat 2 slices, you will have eaten 180 calories. HOW DO I KEEP A FOOD LOG? After each meal, record the following information in your food log:  What you ate.  How much of it you ate.  How many calories it had.  Then, add up your calories. Keep your food log near  you, such as in a small notebook in your pocket. Another option is to use a mobile app or website. Some programs will calculate calories for you and show you how many calories you have left each time you add an item to the log. WHAT ARE SOME CALORIE COUNTING TIPS?  Use your calories on foods and drinks that will fill you up and not leave you hungry. Some examples of this include foods like nuts and nut butters, vegetables, lean proteins, and high-fiber foods (more than 5 g fiber per serving).  Eat nutritious foods and avoid empty calories. Empty calories are calories you get from foods or beverages that do not have many nutrients, such as candy and soda. It is better to have a nutritious high-calorie food (such as an avocado) than a food with few nutrients (such as a bag of chips).  Know how many calories are in the foods you eat most often. This way, you do not have to look up how many calories they have each time you eat them.  Look out for foods that may seem like low-calorie foods but are really high-calorie foods, such as baked goods, soda, and fat-free candy.  Pay attention to calories in drinks. Drinks such as sodas, specialty coffee drinks, alcohol, and juices have a lot of calories yet do not fill you up. Choose low-calorie drinks like water and diet drinks.  Focus your calorie counting efforts on higher calorie items. Logging the calories  in a garden salad that contains only vegetables is less important than calculating the calories in a milk shake.  Find a way of tracking calories that works for you. Get creative. Most people who are successful find ways to keep track of how much they eat in a day, even if they do not count every calorie. WHAT ARE SOME PORTION CONTROL TIPS?  Know how many calories are in a serving. This will help you know how many servings of a certain food you can have.  Use a measuring cup to measure serving sizes. This is helpful when you start out. With time, you will be able to estimate serving sizes for some foods.  Take some time to put servings of different foods on your favorite plates, bowls, and cups so you know what a serving looks like.  Try not to eat straight from a bag or box. Doing this can lead to overeating. Put the amount you would like to eat in a cup or on a plate to make sure you are eating the right portion.  Use smaller plates, glasses, and bowls to prevent overeating. This is a quick and easy way to practice portion control. If your plate is smaller, less food can fit on it.  Try not to multitask while eating, such as watching TV or using your computer. If it is time to eat, sit down at a table and enjoy your food. Doing this will help you to start recognizing when you are full. It will also make you more aware of what and how much you are eating. HOW CAN I CALORIE COUNT WHEN EATING OUT?  Ask for smaller portion sizes or child-sized portions.  Consider sharing an entree and sides instead of getting your own entree.  If you get your own entree, eat only half. Ask for a box at the beginning of your meal and put the rest of your entree in it so you are not tempted to eat it.  Look for the calories on the menu.  If calories are listed, choose the lower calorie options.  Choose dishes that include vegetables, fruits, whole grains, low-fat dairy products, and lean protein. Focusing on  smart food choices from each of the 5 food groups can help you stay on track at restaurants.  Choose items that are boiled, broiled, grilled, or steamed.  Choose water, milk, unsweetened iced tea, or other drinks without added sugars. If you want an alcoholic beverage, choose a lower calorie option. For example, a regular margarita can have up to 700 calories and a glass of wine has around 150.  Stay away from items that are buttered, battered, fried, or served with cream sauce. Items labeled "crispy" are usually fried, unless stated otherwise.  Ask for dressings, sauces, and syrups on the side. These are usually very high in calories, so do not eat much of them.  Watch out for salads. Many people think salads are a healthy option, but this is often not the case. Many salads come with bacon, fried chicken, lots of cheese, fried chips, and dressing. All of these items have a lot of calories. If you want a salad, choose a garden salad and ask for grilled meats or steak. Ask for the dressing on the side, or ask for olive oil and vinegar or lemon to use as dressing.  Estimate how many servings of a food you are given. For example, a serving of cooked rice is  cup or about the size of half a tennis ball or one cupcake wrapper. Knowing serving sizes will help you be aware of how much food you are eating at restaurants. The list below tells you how big or small some common portion sizes are based on everyday objects.  1 oz--4 stacked dice.  3 oz--1 deck of cards.  1 tsp--1 dice.  1 Tbsp-- a Ping-Pong ball.  2 Tbsp--1 Ping-Pong ball.   cup--1 tennis ball or 1 cupcake wrapper.  1 cup--1 baseball. Document Released: 11/20/2005 Document Revised: 04/06/2014 Document Reviewed: 09/25/2013 [facility] Patient Information 2015 [place], [place]. This information is not intended to replace advice given to you by your health care provider. Make sure you discuss any questions you have with your health care  provider.

## 2015-08-02 ENCOUNTER — Telehealth: Payer: Self-pay | Admitting: *Deleted

## 2015-08-02 LAB — SURGICAL PATHOLOGY

## 2015-08-02 NOTE — Telephone Encounter (Signed)
Patient called requesting pathology

## 2015-08-03 NOTE — Telephone Encounter (Signed)
08/02/15-1700-Patient notified that results demonstrated recurrent Paget's disease. Informed patient that I will discuss plan of care with Dr. Theora Gianotti and call patient back in am with MD recommendations. 08/03/15- 12 noon--RN spoke with md via secure messaging. MD recommending that patient come back to clinic to discuss wide local excision of vulva. Patient notified of tx plan. She states that she only wants to see Dr. Theora Gianotti for this discussion. She states that she is concerned about recovering from shoulder surgery and peri-care after surgery. She wants to discuss waiting to have this surgery and postponing the surgery for 2 months until she is ultimately finished with her physical therapy. I told the patient that I would relay these concerns to Dr. Theora Gianotti.  An appointment was arranged for the patient to see Dr. Theora Gianotti on 08/18/15 at 915 am.

## 2015-08-04 NOTE — Progress Notes (Signed)
Assisted provider with pelvic exam and biopsy in the clinic.

## 2015-08-18 ENCOUNTER — Telehealth: Payer: Self-pay | Admitting: *Deleted

## 2015-08-18 ENCOUNTER — Inpatient Hospital Stay: Payer: BLUE CROSS/BLUE SHIELD | Attending: Obstetrics and Gynecology | Admitting: Obstetrics and Gynecology

## 2015-08-18 VITALS — BP 139/82 | HR 70 | Temp 97.2°F | Resp 18 | Ht 64.0 in | Wt 206.1 lb

## 2015-08-18 DIAGNOSIS — Z8673 Personal history of transient ischemic attack (TIA), and cerebral infarction without residual deficits: Secondary | ICD-10-CM | POA: Insufficient documentation

## 2015-08-18 DIAGNOSIS — C4499 Other specified malignant neoplasm of skin, unspecified: Secondary | ICD-10-CM | POA: Insufficient documentation

## 2015-08-18 DIAGNOSIS — E780 Pure hypercholesterolemia: Secondary | ICD-10-CM | POA: Insufficient documentation

## 2015-08-18 DIAGNOSIS — Z808 Family history of malignant neoplasm of other organs or systems: Secondary | ICD-10-CM | POA: Diagnosis not present

## 2015-08-18 DIAGNOSIS — I2510 Atherosclerotic heart disease of native coronary artery without angina pectoris: Secondary | ICD-10-CM | POA: Diagnosis not present

## 2015-08-18 DIAGNOSIS — Z8042 Family history of malignant neoplasm of prostate: Secondary | ICD-10-CM | POA: Insufficient documentation

## 2015-08-18 DIAGNOSIS — E039 Hypothyroidism, unspecified: Secondary | ICD-10-CM | POA: Insufficient documentation

## 2015-08-18 DIAGNOSIS — B029 Zoster without complications: Secondary | ICD-10-CM | POA: Diagnosis not present

## 2015-08-18 DIAGNOSIS — I10 Essential (primary) hypertension: Secondary | ICD-10-CM | POA: Diagnosis not present

## 2015-08-18 DIAGNOSIS — C519 Malignant neoplasm of vulva, unspecified: Secondary | ICD-10-CM | POA: Insufficient documentation

## 2015-08-18 MED ORDER — VALACYCLOVIR HCL 500 MG PO TABS: 500.0000 mg | ORAL_TABLET | Freq: Two times a day (BID) | ORAL | Status: DC

## 2015-08-18 NOTE — Telephone Encounter (Signed)
Already submitted

## 2015-08-18 NOTE — Progress Notes (Signed)
Assisted provider with pelvic exam

## 2015-08-18 NOTE — Progress Notes (Signed)
Gynecologic Oncology Visit   Referring Provider: Dr. Kenton Kingfisher.  Chief Concern: Recurrent paget's disease of the vulva  Subjective:  Bailey Brooks is a 64 y.o. female who is seen in consultation from Dr. Kenton Kingfisher for paget's disease of the vulva. At her last visit on 07/28/2015 for routine surveillance an area on the right vulva seemed more suspicious for recurrence. This was biopsied and confirmed recurrent Paget's disease. The patient presents today for discussion of surgical management. She would like to wait until the end of October or November for surgery as she still recuperating from a right shoulder injury.   DIAGNOSIS:  A. VULVA, RIGHT; BIOPSY:  - PAGET DISEASE.   Gynecologic Oncology History: Bailey Brooks has a history of localized vulvar Paget's disease.   04/2013    vulvar biopsy revealed Paget's disease               WLE, additional margins resected for positive disease on Frozen evaluation.   Part A: VULVA, VAGINAL MARGIN:  - POSITIVE FOR EXTRAMAMMARY PAGET'S DISEASE.  Marland Kitchen  Part B: VULVA, POSTERIOR MARGIN:  - POSITIVE FOR EXTRAMAMMARY PAGET'S DISEASE.  Marland Kitchen  Part C: VULVA, RIGHT, PARTIAL VULVECTOMY:  - EXTRAMAMMARY PAGET'S DISEASE.  Marland Kitchen  Part D: VULVA, NEW VAGINAL MARGIN:  - POSITIVE FOR EXTRAMAMMARY PAGET'S DISEASE.   Problem List: Patient Active Problem List   Diagnosis Date Noted  . Paget disease, extramammary 08/18/2015  . 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  . Elevated BP 05/25/2015  . Adjustment disorder with mixed anxiety and depressed mood 05/25/2015  . Knee pain, right 05/25/2015  . 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 Routine general medical examination at a health care facility 02/27/2013  . Eczema 02/27/2013  . Muscle cramps 11/13/2012  . Screening for breast cancer 11/13/2012  . Personal history of lymphoma 02/20/2012  . Hypothyroidism 02/20/2012  . Obesity 02/20/2012  . Hyperlipidemia 02/20/2012    Past Medical History: Past Medical  History  Diagnosis Date  . 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 Madelynn Done s/p RCHOP and methotrexate, c/b renal failure  . Hypothyroidism   . Paget disease, extra mammary     vulva, s/p resection 2014, Dr. Sabra Heck  . Depression   . Allergic rhinitis   . CAD (coronary artery disease)   . History of CVA (cerebrovascular accident) September 2001  . Arthritis   . Peripheral neuropathy   . Sleep apnea   . 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 H/O stem cell transplant Jul 2012  . Large cell lymphoma   . History of chemotherapy 22-Oct-2010  . Hypertension   . Hypercholesterolemia     Past Surgical History: Past Surgical History  Procedure Laterality Date  . Tonsillectomy  1954  . Limbal stem cell transplant  2012  . Liver biopsy      stage 4B large Bcell lymphoma  . Burr hole w/ placement ommaya reservoir    . Breast biopsy  2002    Neg - AT [facility]  . Abdominal hysterectomy  1985    Hysterectomy-partial  . Vulvectomy    . Shoulder arthroscopy Right 06/22/2015    Procedure: ARTHROSCOPY SHOULDER, parital repair of rotator cuff, biceps tenodesis, decompression and debridement;  Surgeon: Corky Mull, MD;  Location: [facility] ORS;  Service: Orthopedics;  Laterality: Right;  . Cholecystectomy  2010  . Hand surgery      right hand  . Hip  surgery Right 1997  . Insertion central venous access device w/ subcutaneous port  2011    Port a Cath: Right chest Double Lumen, 04-Nov-2010    Past Gynecologic History:  S/p hysterectomy  OB History:  OB History  No data available    Family History: Family History  Problem Relation Age of Onset  . Diabetes Mother   . Hypertension Mother   . Melanoma Father 56    Melanoma  . Kidney disease Sister     Kidney removed   . Prostate cancer Brother     Prostate - dx in 62's  . Hearing loss Maternal Aunt   . Hearing loss Maternal Grandfather   . Heart disease Brother 33     MI & DVT in heart    Social History: Social History   Social History  . Marital Status: Married    Spouse Name: N/A  . Number of Children: 2  . Years of Education: N/A   Occupational History  . 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 Smoking status: Never Smoker   . Smokeless tobacco: Never Used  . Alcohol Use: No     Comment: former user-no current use  . Drug Use: No  . Sexual Activity:    Partners: Male   Other Topics Concern  . Not on file   Social History Narrative   Lives in [place] with husband, has 2 children boys.  No pets. 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    Allergies: Allergies  Allergen Reactions  . Erythromycin Nausea And Vomiting  . Iodine Swelling    IV iodine  . Morphine And Related Nausea And Vomiting    Hallucinations   . Sulfa Antibiotics Other (See Comments)    Dizzy/Fainting  . Adhesive [Tape] Other (See Comments) and Rash    Including band-aide Including band-aide    Current Medications: Current Outpatient Prescriptions  Medication Sig Dispense Refill  . Cholecalciferol (VITAMIN D) 2000 UNITS tablet Take 2,000 Units by mouth 2 (two) times daily.    . lansoprazole (PREVACID) 30 MG capsule take 1 capsule by mouth once daily 30 capsule 5  . levothyroxine (SYNTHROID, LEVOTHROID) 75 MCG tablet take 1 tablet by mouth once daily BEFORE BREAKFAST 30 tablet 6  . magnesium chloride (SLOW-MAG) 64 MG TBEC SR tablet Take 1 tablet by mouth 2 (two) times daily.    . potassium chloride (K-DUR) 10 MEQ tablet take 1 tablet by mouth twice a day 180 tablet 1  . valACYclovir (VALTREX) 500 MG tablet Take 1 tablet (500 mg total) by mouth 2 (two) times daily. 60 tablet 3   No current facility-administered medications for this visit.     Objective:  Physical Examination:  BP 139/82 mmHg  Pulse 70  Temp(Src) 97.2 F (36.2 C) (Tympanic)  Resp 18  Ht 5\' 4"  (1.626 m)  Wt 206 lb 2.1 oz (93.5 kg)  BMI 35.36 kg/m2    Body mass index is 35.36 kg/(m^2).    ECOG Performance Status: 1 - Symptomatic but completely ambulatory  General appearance: alert, cooperative and appears stated age HEENT:PERRLA, extra ocular movement intact and sclera clear, anicteric Lymph node survey: non-palpable, inguinal Neurological exam reveals alert, oriented, normal speech, no focal findings  Pelvic: exam chaperoned by nurse;  Vulva: vulvar scars after resection, erythema and slight raised mucosa at the vaginal vulvar junction at 7 o'clock biopsy site. The area of abnormality seems to extend approximately 1.5 cm from 6-8 o'clock.  I reviewed these findings with the patient.   Lab Review Labs on site today: none  Radiologic Imaging: n/a    Assessment:  Bailey Brooks is a 64 y.o. female diagnosed with recurrent Paget's disease of the vulva s/p WLE with positive margins.   Plan:   Problem List Items Addressed This Visit      Musculoskeletal and Integument   Paget disease, extramammary    Other Visit Diagnoses    Herpes zoster    -  Primary    Relevant Medications    valACYclovir (VALTREX) 500 MG tablet       I recommended vulvar wide local excision. Given her prior history and anatomy issues due to prior resection I will have to tailor our excision accordingly. We discussed the risks of surgery in detail. These include infection, anesthesia, bleeding, transfusion, wound separation, medical issues (blood clots, stroke, heart attack, fluid in the lungs, pneumonia, abnormal heart rhythm, death), possible exploratory surgery with larger incision, allergic reaction, persistent scar tissue and pain. The patient agrees to proceed and we will plan to schedule Sep 29, 2015.   Gillis Ends, MD    CC:  Dr. Kenton Kingfisher.

## 2015-08-19 NOTE — H&P (Addendum)
Gynecologic Oncology Visit   Referring Provider: Dr. Kenton Kingfisher.  Chief Concern: Recurrent paget's disease of the vulva  Subjective:  Bailey Brooks is a 64 y.o. female who is seen in consultation from Dr. Kenton Kingfisher for paget's disease of the vulva. At her last visit on 07/28/2015 for routine surveillance an area on the right vulva seemed more suspicious for recurrence. This was biopsied and confirmed recurrent Paget's disease. The patient presents today for discussion of surgical management. She would like to wait until the end of October or November for surgery as she still recuperating from a right shoulder injury.   DIAGNOSIS:  A. VULVA, RIGHT; BIOPSY:  - PAGET DISEASE.   Gynecologic Oncology History: Bailey Brooks has a history of localized vulvar Paget's disease.   04/2013    vulvar biopsy revealed Paget's disease               WLE, additional margins resected for positive disease on Frozen evaluation.   Part A: VULVA, VAGINAL MARGIN:  - POSITIVE FOR EXTRAMAMMARY PAGET'S DISEASE.  Marland Kitchen  Part B: VULVA, POSTERIOR MARGIN:  - POSITIVE FOR EXTRAMAMMARY PAGET'S DISEASE.  Marland Kitchen  Part C: VULVA, RIGHT, PARTIAL VULVECTOMY:  - EXTRAMAMMARY PAGET'S DISEASE.  Marland Kitchen  Part D: VULVA, NEW VAGINAL MARGIN:  - POSITIVE FOR EXTRAMAMMARY PAGET'S DISEASE.   Problem List: Patient Active Problem List   Diagnosis Date Noted  . Paget disease, extramammary 08/18/2015  . 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  . Elevated BP 05/25/2015  . Adjustment disorder with mixed anxiety and depressed mood 05/25/2015  . Knee pain, right 05/25/2015  . 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 Routine general medical examination at a health care facility 02/27/2013  . Eczema 02/27/2013  . Muscle cramps 11/13/2012  . Screening for breast cancer 11/13/2012  . Personal history of lymphoma 02/20/2012  . Hypothyroidism 02/20/2012  . Obesity 02/20/2012  . Hyperlipidemia 02/20/2012    Past Medical History: Past Medical  History  Diagnosis Date  . 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 Madelynn Done s/p RCHOP and methotrexate, c/b renal failure  . Hypothyroidism   . Paget disease, extra mammary     vulva, s/p resection 2014, Dr. Sabra Heck  . Depression   . Allergic rhinitis   . CAD (coronary artery disease)   . History of CVA (cerebrovascular accident) September 2001  . Arthritis   . Peripheral neuropathy   . Sleep apnea   . 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 H/O stem cell transplant Jul 2012  . Large cell lymphoma   . History of chemotherapy 22-Oct-2010  . Hypertension   . Hypercholesterolemia     Past Surgical History: Past Surgical History  Procedure Laterality Date  . Tonsillectomy  1954  . Limbal stem cell transplant  2012  . Liver biopsy      stage 4B large Bcell lymphoma  . Burr hole w/ placement ommaya reservoir    . Breast biopsy  2002    Neg - AT [facility]  . Abdominal hysterectomy  1985    Hysterectomy-partial  . Vulvectomy    . Shoulder arthroscopy Right 06/22/2015    Procedure: ARTHROSCOPY SHOULDER, parital repair of rotator cuff, biceps tenodesis, decompression and debridement;  Surgeon: Corky Mull, MD;  Location: [facility] ORS;  Service: Orthopedics;  Laterality: Right;  . Cholecystectomy  2010  . Hand surgery      right hand  . Hip  surgery Right 1997  . Insertion central venous access device w/ subcutaneous port  2011    Port a Cath: Right chest Double Lumen, 04-Nov-2010    Past Gynecologic History:  S/p hysterectomy  OB History:  OB History  No data available    Family History: Family History  Problem Relation Age of Onset  . Diabetes Mother   . Hypertension Mother   . Melanoma Father 75    Melanoma  . Kidney disease Sister     Kidney removed   . Prostate cancer Brother     Prostate - dx in 87's  . Hearing loss Maternal Aunt   . Hearing loss Maternal Grandfather   . Heart disease Brother 52     MI & DVT in heart    Social History: Social History   Social History  . Marital Status: Married    Spouse Name: N/A  . Number of Children: 2  . Years of Education: N/A   Occupational History  . 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 Smoking status: Never Smoker   . Smokeless tobacco: Never Used  . Alcohol Use: No     Comment: former user-no current use  . Drug Use: No  . Sexual Activity:    Partners: Male   Other Topics Concern  . Not on Brooks   Social History Narrative   Lives in [place] with husband, has 2 children boys.  No pets. 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    Allergies: Allergies  Allergen Reactions  . Erythromycin Nausea And Vomiting  . Iodine Swelling    IV iodine  . Morphine And Related Nausea And Vomiting    Hallucinations   . Sulfa Antibiotics Other (See Comments)    Dizzy/Fainting  . Adhesive [Tape] Other (See Comments) and Rash    Including band-aide Including band-aide    Current Medications: Current Outpatient Prescriptions  Medication Sig Dispense Refill  . Cholecalciferol (VITAMIN D) 2000 UNITS tablet Take 2,000 Units by mouth 2 (two) times daily.    . lansoprazole (PREVACID) 30 MG capsule take 1 capsule by mouth once daily 30 capsule 5  . levothyroxine (SYNTHROID, LEVOTHROID) 75 MCG tablet take 1 tablet by mouth once daily BEFORE BREAKFAST 30 tablet 6  . magnesium chloride (SLOW-MAG) 64 MG TBEC SR tablet Take 1 tablet by mouth 2 (two) times daily.    . potassium chloride (K-DUR) 10 MEQ tablet take 1 tablet by mouth twice a day 180 tablet 1  . valACYclovir (VALTREX) 500 MG tablet Take 1 tablet (500 mg total) by mouth 2 (two) times daily. 60 tablet 3   No current facility-administered medications for this visit.     Objective:  Physical Examination:  BP 139/82 mmHg  Pulse 70  Temp(Src) 97.2 F (36.2 C) (Tympanic)  Resp 18  Ht 5\' 4"  (1.626 m)  Wt 206 lb 2.1 oz (93.5 kg)  BMI 35.36 kg/m2    Body mass index is 35.36 kg/(m^2).    ECOG Performance Status: 1 - Symptomatic but completely ambulatory  General appearance: alert, cooperative and appears stated age HEENT:PERRLA, extra ocular movement intact and sclera clear, anicteric Lymph node survey: non-palpable, inguinal Neurological exam reveals alert, oriented, normal speech, no focal findings  Pelvic: exam chaperoned by nurse;  Vulva: vulvar scars after resection, erythema and slight raised mucosa at the vaginal vulvar junction at 7 o'clock biopsy site. The area of abnormality seems to extend approximately 1.5 cm from 6-8 o'clock.  I reviewed these findings with the patient.   Lab Review Labs on site today: none  Radiologic Imaging: n/a    Assessment:  Bailey Brooks is a 64 y.o. female diagnosed with recurrent Paget's disease of the vulva s/p WLE with positive margins.   Plan:   Problem List Items Addressed This Visit      Musculoskeletal and Integument   Paget disease, extramammary    Other Visit Diagnoses    Herpes zoster    -  Primary    Relevant Medications    valACYclovir (VALTREX) 500 MG tablet       I recommended vulvar wide local excision. Given her prior history and anatomy issues due to prior resection I will have to tailor our excision accordingly. We discussed the risks of surgery in detail. These include infection, anesthesia, bleeding, transfusion, wound separation, medical issues (blood clots, stroke, heart attack, fluid in the lungs, pneumonia, abnormal heart rhythm, death), possible exploratory surgery with larger incision, allergic reaction, persistent scar tissue and pain. The patient agrees to proceed and we will plan to schedule Sep 29, 2015.   Gillis Ends, MD    CC:  Dr. Kenton Kingfisher.    Bailey Brooks has no significant changes since she was last seen. Her labs are acceptable for surgery. Consent is signed. We will proceed with surgery as planned.  Gillis Ends, MD

## 2015-08-23 ENCOUNTER — Inpatient Hospital Stay: Admission: RE | Admit: 2015-08-23 | Payer: BLUE CROSS/BLUE SHIELD | Source: Ambulatory Visit

## 2015-09-05 ENCOUNTER — Emergency Department
Admission: EM | Admit: 2015-09-05 | Discharge: 2015-09-05 | Disposition: A | Discharge status: Home / Self Care | Payer: BLUE CROSS/BLUE SHIELD | Attending: Emergency Medicine | Admitting: Emergency Medicine

## 2015-09-05 ENCOUNTER — Encounter: Payer: Self-pay | Admitting: Emergency Medicine

## 2015-09-05 DIAGNOSIS — B3731 Acute candidiasis of vulva and vagina: Secondary | ICD-10-CM

## 2015-09-05 DIAGNOSIS — N898 Other specified noninflammatory disorders of vagina: Secondary | ICD-10-CM | POA: Diagnosis present

## 2015-09-05 DIAGNOSIS — B373 Candidiasis of vulva and vagina: Secondary | ICD-10-CM | POA: Insufficient documentation

## 2015-09-05 DIAGNOSIS — I10 Essential (primary) hypertension: Secondary | ICD-10-CM | POA: Diagnosis not present

## 2015-09-05 DIAGNOSIS — Z79899 Other long term (current) drug therapy: Secondary | ICD-10-CM | POA: Insufficient documentation

## 2015-09-05 LAB — URINALYSIS COMPLETE WITH MICROSCOPIC (ARMC ONLY)
Bacteria, UA: NONE SEEN
Bilirubin Urine: NEGATIVE
GLUCOSE, UA: NEGATIVE mg/dL
KETONES UR: NEGATIVE mg/dL
NITRITE: NEGATIVE
PROTEIN: NEGATIVE mg/dL
SPECIFIC GRAVITY, URINE: 1.021 (ref 1.005–1.030)
pH: 5 (ref 5.0–8.0)

## 2015-09-05 MED ORDER — FLUCONAZOLE 150 MG PO TABS: 150.0000 mg | ORAL_TABLET | Freq: Every day | ORAL | Status: DC

## 2015-09-05 NOTE — ED Notes (Signed)
Patient reports severe vaginal itching and burning for one week, Patient reports being on antibiotics for sinus congestion.  Denies any fever/chills.

## 2015-09-05 NOTE — ED Provider Notes (Signed)
[facility] Emergency Department Provider Note  ____________________________________________  Time seen: Approximately 11:08 AM  I have reviewed the triage vital signs and the nursing notes.   HISTORY  Chief Complaint   HPI Bailey Brooks is a 64 y.o. female who presents for evaluation of vaginal itching and discharge. Patient states that recently placed on antibiotics for sinusitis and scheduled for a vaginal surgery a couple weeks and thinks that she has eased infection based on previous history. Denies any other complaints at this time. Early on Augmentin.   Past Medical History  Diagnosis Date  . Diffuse large B cell lymphoma [facility]) Nov 2011    Dr Delorise Royals, Dr. Madelynn Done s/p RCHOP and methotrexate, c/b renal failure  . Hypothyroidism   . Paget disease, extra mammary     vulva, s/p resection 2014, Dr. Sabra Heck  . Depression   . Allergic rhinitis   . CAD (coronary artery disease)   . History of CVA (cerebrovascular accident) September 2001  . Arthritis   . Peripheral neuropathy ([place])   . Sleep apnea   . 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 H/O stem cell transplant [facility]) Jul 2012  . Large cell lymphoma ([place])   . History of chemotherapy 22-Oct-2010  . Hypertension   . Hypercholesterolemia     Patient Active Problem List   Diagnosis Date Noted  . Paget disease, extramammary 08/18/2015  . 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  . Elevated BP 05/25/2015  . Adjustment disorder with mixed anxiety and depressed mood 05/25/2015  . Knee pain, right 05/25/2015  . 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 Routine general medical examination at a health care facility 02/27/2013  . Eczema 02/27/2013  . Muscle cramps 11/13/2012  . Screening for breast cancer 11/13/2012  . Personal history of lymphoma 02/20/2012  . Hypothyroidism 02/20/2012  . Obesity 02/20/2012  . Hyperlipidemia 02/20/2012     Past Surgical History  Procedure Laterality Date  . Tonsillectomy  1954  . Limbal stem cell transplant  2012  . Liver biopsy      stage 4B large Bcell lymphoma  . Burr hole w/ placement ommaya reservoir    . Breast biopsy  2002    Neg - AT [facility]  . Abdominal hysterectomy  1985    Hysterectomy-partial  . Vulvectomy    . Shoulder arthroscopy Right 06/22/2015    Procedure: ARTHROSCOPY SHOULDER, parital repair of rotator cuff, biceps tenodesis, decompression and debridement;  Surgeon: Corky Mull, MD;  Location: [facility] ORS;  Service: Orthopedics;  Laterality: Right;  . Cholecystectomy  2010  . Hand surgery      right hand  . Hip surgery Right 1997  . Insertion central venous access device w/ subcutaneous port  2011    Port a Cath: Right chest Double Lumen, 04-Nov-2010    Current Outpatient Rx  Name  Route  Sig  Dispense  Refill  . Cholecalciferol (VITAMIN D) 2000 UNITS tablet   Oral   Take 2,000 Units by mouth 2 (two) times daily.         . fluconazole (DIFLUCAN) 150 MG tablet   Oral   Take 1 tablet (150 mg total) by mouth daily.   2 tablet   0     Take one pill initially, repeat in 3 days if neede ...   . lansoprazole (PREVACID) 30 MG capsule      take 1 capsule by mouth once daily  30 capsule   5   . levothyroxine (SYNTHROID, LEVOTHROID) 75 MCG tablet      take 1 tablet by mouth once daily BEFORE BREAKFAST   30 tablet   6   . magnesium chloride (SLOW-MAG) 64 MG TBEC SR tablet   Oral   Take 1 tablet by mouth 2 (two) times daily.         . potassium chloride (K-DUR) 10 MEQ tablet      take 1 tablet by mouth twice a day   180 tablet   1   . valACYclovir (VALTREX) 500 MG tablet   Oral   Take 1 tablet (500 mg total) by mouth 2 (two) times daily.   60 tablet   3     Allergies Erythromycin; Iodine; Morphine and related; Sulfa antibiotics; and Adhesive  Family History  Problem Relation Age of Onset  . Diabetes Mother   . Hypertension Mother   .  Melanoma Father 37    Melanoma  . Kidney disease Sister     Kidney removed   . Prostate cancer Brother     Prostate - dx in 57's  . Hearing loss Maternal Aunt   . Hearing loss Maternal Grandfather   . Heart disease Brother 29    MI & DVT in heart    Social History Social History  Substance Use Topics  . Smoking status: Never Smoker   . Smokeless tobacco: Never Used  . Alcohol Use: No     Comment: former user-no current use    Review of Systems Constitutional: No fever/chills Eyes: No visual changes. ENT: No sore throat. Cardiovascular: Denies chest pain. Respiratory: Denies shortness of breath. Gastrointestinal: No abdominal pain.  No nausea, no vomiting.  No diarrhea.  No constipation. Genitourinary: Positive for thick whitish discharge consistent with yeast. Musculoskeletal: Negative for back pain. Skin: Negative for rash. Neurological: Negative for headaches, focal weakness or numbness.  10-point ROS otherwise negative.  ____________________________________________   PHYSICAL EXAM:  VITAL SIGNS: ED Triage Vitals  Enc Vitals Group     BP 09/05/15 1014 151/84 mmHg     Pulse Rate 09/05/15 1014 74     Resp 09/05/15 1014 18     Temp 09/05/15 1014 98.5 F (36.9 C)     Temp Source 09/05/15 1014 Oral     SpO2 09/05/15 1014 99 %     Weight 09/05/15 1014 205 lb (92.987 kg)     Height 09/05/15 1014 5\' 4"  (1.626 m)     Head Cir --      Peak Flow --      Pain Score 09/05/15 1014 3     Pain Loc --      Pain Edu? --      Excl. in [place]? --     Constitutional: Alert and oriented. Well appearing and in no acute distress.   Cardiovascular: Normal rate, regular rhythm. Grossly normal heart sounds.  Good peripheral circulation. Respiratory: Normal respiratory effort.  No retractions. Lungs CTAB. Gastrointestinal: Soft and nontender. No distention. No abdominal bruits. No CVA tenderness. Genitourinary: Exam deferred at patient's request. Discharge consistent with recent  antibiotic use and vaginal candidiasis. Musculoskeletal: No lower extremity tenderness nor edema.  No joint effusions. Neurologic:  Normal speech and language. No gross focal neurologic deficits are appreciated. No gait instability. Skin:  Skin is warm, dry and intact. No rash noted. Psychiatric: Mood and affect are normal. Speech and behavior are normal.  ____________________________________________   LABS (all labs ordered  are listed, but only abnormal results are displayed)  Labs Reviewed  URINALYSIS COMPLETEWITH MICROSCOPIC ([place]) - Abnormal; Notable for the following:    Color, Urine YELLOW (*)    APPearance CLEAR (*)    Hgb urine dipstick 1+ (*)    Leukocytes, UA 2+ (*)    Squamous Epithelial / LPF 0-5 (*)    All other components within normal limits   ____________________________________________    PROCEDURES  Procedure(s) performed: None  Critical Care performed: No  ____________________________________________   INITIAL IMPRESSION / ASSESSMENT AND PLAN / ED COURSE  Pertinent labs & imaging results that were available during my care of the patient were reviewed by me and considered in my medical decision making (see chart for details).  ECG infection secondary to antibiotic use. Rx given for Diflucan 150 mg by mouth times 1 repeat in 3 days if necessary. Patient voices no other emergency medical complaints at this time. ____________________________________________   FINAL CLINICAL IMPRESSION(S) / ED DIAGNOSES  Final diagnoses:  Vaginal candidiasis      Arlyss Repress, PA-C 09/05/15 [place], MD 09/06/15 [phone]

## 2015-09-05 NOTE — ED Notes (Signed)
Pt states she has been having vaginal itching since last Sunday, has been on antibiotics for sinus infection. Has not done any OTC vaginal itching meds.

## 2015-09-05 NOTE — ED Notes (Signed)
NAD noted at time of D/C. Pt denies questions or concerns. Pt ambulatory to the lobby at this time.  

## 2015-09-05 NOTE — Discharge Instructions (Signed)

## 2015-09-07 ENCOUNTER — Ambulatory Visit (INDEPENDENT_AMBULATORY_CARE_PROVIDER_SITE_OTHER): Payer: BLUE CROSS/BLUE SHIELD | Admitting: Internal Medicine

## 2015-09-07 ENCOUNTER — Encounter: Payer: Self-pay | Admitting: Internal Medicine

## 2015-09-07 VITALS — BP 118/73 | HR 73 | Temp 98.0°F | Ht 63.5 in | Wt 209.4 lb

## 2015-09-07 DIAGNOSIS — B372 Candidiasis of skin and nail: Secondary | ICD-10-CM

## 2015-09-07 DIAGNOSIS — J0100 Acute maxillary sinusitis, unspecified: Secondary | ICD-10-CM | POA: Diagnosis not present

## 2015-09-07 DIAGNOSIS — C4499 Other specified malignant neoplasm of skin, unspecified: Secondary | ICD-10-CM

## 2015-09-07 DIAGNOSIS — J320 Chronic maxillary sinusitis: Secondary | ICD-10-CM | POA: Insufficient documentation

## 2015-09-07 MED ORDER — FLUCONAZOLE 150 MG PO TABS: 150.0000 mg | ORAL_TABLET | ORAL | Status: DC

## 2015-09-07 MED ORDER — NYSTATIN 100000 UNIT/GM EX POWD: Freq: Four times a day (QID) | CUTANEOUS | Status: DC

## 2015-09-07 NOTE — Progress Notes (Signed)
Pre visit review using our clinic review tool, if applicable. No additional management support is needed unless otherwise documented below in the visit note. 

## 2015-09-07 NOTE — Progress Notes (Signed)
Subjective:    Patient ID: Bailey Brooks, female    DOB: 02-Aug-1951, 64 y.o.   MRN: [id number]  HPI  64YO female presents for follow up.  Seen in ED for yeast infection. Treated with Diflucan.  Developed redness and white exudate over vaginal area and groin a few weeks ago. Burning and itching. Was recently on Augmentin for sinus infection. No improvement after single Diflucan.  Continues to have nasal congestion and sinus pressure. Does not want to consider ENT evaluation.  Scheduled for surgery for Paget's disease Oct 26th.   Wt Readings from Last 3 Encounters:  09/07/15 209 lb 6 oz (94.972 kg)  09/05/15 205 lb (92.987 kg)  08/18/15 206 lb 2.1 oz (93.5 kg)   BP Readings from Last 3 Encounters:  09/07/15 118/73  09/05/15 151/84  08/18/15 139/82    Past Medical History  Diagnosis Date  . 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 Madelynn Done s/p RCHOP and methotrexate, c/b renal failure  . Hypothyroidism   . Paget disease, extra mammary     vulva, s/p resection 2014, Dr. Sabra Heck  . Depression   . Allergic rhinitis   . CAD (coronary artery disease)   . History of CVA (cerebrovascular accident) September 2001  . Arthritis   . Peripheral neuropathy ([place])   . Sleep apnea   . 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 H/O stem cell transplant [facility]) Jul 2012  . Large cell lymphoma ([place])   . History of chemotherapy 22-Oct-2010  . Hypertension   . Hypercholesterolemia    Family History  Problem Relation Age of Onset  . Diabetes Mother   . Hypertension Mother   . Melanoma Father 25    Melanoma  . Kidney disease Sister     Kidney removed   . Prostate cancer Brother     Prostate - dx in 12's  . Hearing loss Maternal Aunt   . Hearing loss Maternal Grandfather   . Heart disease Brother 33    MI & DVT in heart   Past Surgical History  Procedure Laterality Date  . Tonsillectomy  1954  .  Limbal stem cell transplant  2012  . Liver biopsy      stage 4B large Bcell lymphoma  . Burr hole w/ placement ommaya reservoir    . Breast biopsy  2002    Neg - AT [facility]  . Abdominal hysterectomy  1985    Hysterectomy-partial  . Vulvectomy    . Shoulder arthroscopy Right 06/22/2015    Procedure: ARTHROSCOPY SHOULDER, parital repair of rotator cuff, biceps tenodesis, decompression and debridement;  Surgeon: Corky Mull, MD;  Location: [facility] ORS;  Service: Orthopedics;  Laterality: Right;  . Cholecystectomy  2010  . Hand surgery      right hand  . Hip surgery Right 1997  . Insertion central venous access device w/ subcutaneous port  2011    Port a Cath: Right chest Double Lumen, 04-Nov-2010   Social History   Social History  . Marital Status: Married    Spouse Name: N/A  . Number of Children: 2  . Years of Education: N/A   Occupational History  . 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 Smoking status: Never Smoker   . Smokeless tobacco: Never Used  . Alcohol Use: No     Comment: former user-no  current use  . Drug Use: No  . Sexual Activity:    Partners: Male   Other Topics Concern  . None   Social History Narrative   Lives in [place] with husband, has 2 children boys.  No pets. 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    Review of Systems  Constitutional: Negative for fever, chills, appetite change, fatigue and unexpected weight change.  HENT: Positive for congestion, postnasal drip, rhinorrhea and sinus pressure.   Eyes: Negative for visual disturbance.  Respiratory: Negative for shortness of breath.   Cardiovascular: Negative for chest pain and leg swelling.  Gastrointestinal: Negative for nausea, vomiting, abdominal pain, diarrhea and constipation.  Musculoskeletal: Negative for myalgias and arthralgias.  Skin: Positive for color change and rash.  Hematological: Negative for adenopathy. Does not bruise/bleed easily.    Psychiatric/Behavioral: Negative for dysphoric mood. The patient is not nervous/anxious.        Objective:    BP 118/73 mmHg  Pulse 73  Temp(Src) 98 F (36.7 C) (Oral)  Ht 5' 3.5" (1.613 m)  Wt 209 lb 6 oz (94.972 kg)  BMI 36.50 kg/m2  SpO2 98% Physical Exam  Constitutional: She is oriented to person, place, and time. She appears well-developed and well-nourished. No distress.  HENT:  Head: Normocephalic and atraumatic.  Right Ear: External ear normal.  Left Ear: External ear normal.  Nose: Nose normal.  Mouth/Throat: Oropharynx is clear and moist.  Eyes: Conjunctivae are normal. Pupils are equal, round, and reactive to light. Right eye exhibits no discharge. Left eye exhibits no discharge. No scleral icterus.  Neck: Normal range of motion. Neck supple. No tracheal deviation present. No thyromegaly present.  Pulmonary/Chest: Effort normal. No accessory muscle usage. No tachypnea. She has no decreased breath sounds. She has no rhonchi.  Musculoskeletal: Normal range of motion. She exhibits no edema or tenderness.  Lymphadenopathy:    She has no cervical adenopathy.  Neurological: She is alert and oriented to person, place, and time. No cranial nerve deficit. She exhibits normal muscle tone. Coordination normal.  Skin: Skin is warm and dry. Rash noted. Rash is maculopapular. She is not diaphoretic. No erythema. No pallor.     Psychiatric: She has a normal mood and affect. Her behavior is normal. Judgment and thought content normal.          Assessment & Plan:   Problem List Items Addressed This Visit      Unprioritized   Candidiasis of skin - Primary    Diffuse candidiasis under pannus folds and over vulva. Will start topical Nystatin powder and weekly Diflucan x3 weeks. Follow up if no improvement.      Relevant Medications   fluconazole (DIFLUCAN) 150 MG tablet   nystatin (MYCOSTATIN) powder   Maxillary sinusitis    Persistent sinus congestion, pressure, pain  despite recent treatment with Augmentin. We discussed potential risks of treating with more antibiotics and/or prednisone. Recommended referral to ENT for direct visualization. She declines for now. Will continue home Flonase and call if symptoms not improving.      Relevant Medications   fluconazole (DIFLUCAN) 150 MG tablet   Paget disease, extra mammary (Chronic)    Scheduled for surgical resection 10/26. Encouraged her to let her GYN know about recent yeast infection.          Return in about 4 weeks (around 10/05/2015).

## 2015-09-07 NOTE — Patient Instructions (Signed)
Keep skin in groin and over abdomen dry. Wear loose fitting clothes.  Apply Nystatin powder to groin and under skin folds up to 4 times daily.  Take Diflucan today. Repeat dose on Saturday Oct 8th, and then again on Oct 15th.  Please let your gynecologist know about recent yeast infection, given upcoming surgery.  Follow up if symptoms are not improving.

## 2015-09-07 NOTE — Assessment & Plan Note (Signed)
Scheduled for surgical resection 10/26. Encouraged her to let her GYN know about recent yeast infection.

## 2015-09-07 NOTE — Assessment & Plan Note (Signed)
Diffuse candidiasis under pannus folds and over vulva. Will start topical Nystatin powder and weekly Diflucan x3 weeks. Follow up if no improvement.

## 2015-09-07 NOTE — Assessment & Plan Note (Signed)
Persistent sinus congestion, pressure, pain despite recent treatment with Augmentin. We discussed potential risks of treating with more antibiotics and/or prednisone. Recommended referral to ENT for direct visualization. She declines for now. Will continue home Flonase and call if symptoms not improving.

## 2015-09-16 ENCOUNTER — Encounter: Payer: Self-pay | Admitting: Obstetrics and Gynecology

## 2015-09-22 ENCOUNTER — Encounter
Admission: RE | Admit: 2015-09-22 | Discharge: 2015-09-22 | Disposition: A | Discharge status: Home / Self Care | Payer: BLUE CROSS/BLUE SHIELD | Source: Ambulatory Visit | Attending: Obstetrics and Gynecology | Admitting: Obstetrics and Gynecology

## 2015-09-22 DIAGNOSIS — Z01818 Encounter for other preprocedural examination: Secondary | ICD-10-CM | POA: Diagnosis not present

## 2015-09-22 DIAGNOSIS — C4499 Other specified malignant neoplasm of skin, unspecified: Secondary | ICD-10-CM | POA: Insufficient documentation

## 2015-09-22 LAB — CBC
HEMATOCRIT: 41.8 % (ref 35.0–47.0)
Hemoglobin: 13.8 g/dL (ref 12.0–16.0)
MCH: 30.1 pg (ref 26.0–34.0)
MCHC: 33 g/dL (ref 32.0–36.0)
MCV: 91.2 fL (ref 80.0–100.0)
Platelets: 180 10*3/uL (ref 150–440)
RBC: 4.58 MIL/uL (ref 3.80–5.20)
RDW: 14.2 % (ref 11.5–14.5)
WBC: 5 10*3/uL (ref 3.6–11.0)

## 2015-09-22 NOTE — OR Nursing (Signed)
Voicemail message left for Dr. Theora Gianotti, patients H&P will be out of date on the day of surgery.

## 2015-09-22 NOTE — Progress Notes (Signed)
OR requested updated H&P  Patient called complaining of yeast infection. She is undergoing treatment. We do not think treatment will interfere with surgery.  Gillis Ends, MD    Gynecologic Oncology H&P from visit 08/18/2015  Referring Provider: Dr. Kenton Kingfisher.  Chief Concern: Recurrent paget's disease of the vulva  Subjective:  Bailey Brooks is a 64 y.o. female who is seen in consultation from Dr. Kenton Kingfisher for paget's disease of the vulva. At her last visit on 07/28/2015 for routine surveillance an area on the right vulva seemed more suspicious for recurrence. This was biopsied and confirmed recurrent Paget's disease. The patient presents today for discussion of surgical management. She would like to wait until the end of October or November for surgery as she still recuperating from a right shoulder injury.   DIAGNOSIS:  A. VULVA, RIGHT; BIOPSY:  - PAGET DISEASE.   Gynecologic Oncology History: Bailey Brooks has a history of localized vulvar Paget's disease.   04/2013 vulvar biopsy revealed Paget's disease  WLE, additional margins resected for positive disease on [place] evaluation.   Part A: VULVA, VAGINAL MARGIN:  - POSITIVE FOR EXTRAMAMMARY PAGET'S DISEASE.  Marland Kitchen  Part B: VULVA, POSTERIOR MARGIN:  - POSITIVE FOR EXTRAMAMMARY PAGET'S DISEASE.  Marland Kitchen  Part C: VULVA, RIGHT, PARTIAL VULVECTOMY:  - EXTRAMAMMARY PAGET'S DISEASE.  Marland Kitchen  Part D: VULVA, NEW VAGINAL MARGIN:  - POSITIVE FOR EXTRAMAMMARY PAGET'S DISEASE.   Problem List: Patient Active Problem List   Diagnosis Date Noted  . Paget disease, extramammary 08/18/2015  . 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  . Elevated BP 05/25/2015  . Adjustment disorder with mixed anxiety and depressed mood 05/25/2015  . Knee pain, right 05/25/2015  . 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 Routine general medical examination at a health care facility 02/27/2013  . Eczema 02/27/2013   . Muscle cramps 11/13/2012  . Screening for breast cancer 11/13/2012  . Personal history of lymphoma 02/20/2012  . Hypothyroidism 02/20/2012  . Obesity 02/20/2012  . Hyperlipidemia 02/20/2012    Past Medical History: Past Medical History  Diagnosis Date  . 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 Madelynn Done s/p RCHOP and methotrexate, c/b renal failure  . Hypothyroidism   . Paget disease, extra mammary     vulva, s/p resection 2014, Dr. Sabra Heck  . Depression   . Allergic rhinitis   . CAD (coronary artery disease)   . History of CVA (cerebrovascular accident) September 2001  . Arthritis   . Peripheral neuropathy   . Sleep apnea   . 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 H/O stem cell transplant Jul 2012  . Large cell lymphoma   . History of chemotherapy 22-Oct-2010  . Hypertension   . Hypercholesterolemia     Past Surgical History: Past Surgical History  Procedure Laterality Date  . Tonsillectomy  1954  . Limbal stem cell transplant  2012  . Liver biopsy      stage 4B large Bcell lymphoma  . Burr hole w/ placement ommaya reservoir    . Breast biopsy  2002    Neg - AT [facility]  . Abdominal hysterectomy  1985    Hysterectomy-partial  . Vulvectomy    . Shoulder arthroscopy Right 06/22/2015    Procedure: ARTHROSCOPY SHOULDER, parital repair of rotator cuff, biceps tenodesis, decompression and debridement; Surgeon: Corky Mull, MD; Location: [facility] ORS; Service: Orthopedics; Laterality: Right;  . Cholecystectomy  2010  .  Hand surgery      right hand  . Hip surgery Right 1997  . Insertion central venous access device w/ subcutaneous port  2011    Port a Cath: Right chest Double Lumen, 04-Nov-2010    Past Gynecologic History:  S/p hysterectomy  OB  History:  OB History  No data available    Family History: Family History  Problem Relation Age of Onset  . Diabetes Mother   . Hypertension Mother   . Melanoma Father 75    Melanoma  . Kidney disease Sister     Kidney removed   . Prostate cancer Brother     Prostate - dx in 24's  . Hearing loss Maternal Aunt   . Hearing loss Maternal Grandfather   . Heart disease Brother 40    MI & DVT in heart    Social History: Social History   Social History  . Marital Status: Married    Spouse Name: N/A  . Number of Children: 2  . Years of Education: N/A   Occupational History  . 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 Smoking status: Never Smoker   . Smokeless tobacco: Never Used  . Alcohol Use: No     Comment: former user-no current use  . Drug Use: No  . Sexual Activity:    Partners: Male   Other Topics Concern  . Not on file   Social History Narrative   Lives in [place] with husband, has 2 children boys. No pets. 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    Allergies: Allergies  Allergen Reactions  . Erythromycin Nausea And Vomiting  . Iodine Swelling    IV iodine  . Morphine And Related Nausea And Vomiting    Hallucinations   . Sulfa Antibiotics Other (See Comments)    Dizzy/Fainting  . Adhesive [Tape] Other (See Comments) and Rash    Including band-aide Including band-aide    Current Medications: Current Outpatient Prescriptions  Medication Sig Dispense Refill  . Cholecalciferol (VITAMIN D) 2000 UNITS tablet Take 2,000 Units by mouth 2 (two) times daily.    . lansoprazole (PREVACID) 30 MG capsule take 1 capsule by mouth once daily 30 capsule 5  . levothyroxine (SYNTHROID, LEVOTHROID) 75 MCG tablet take 1 tablet by mouth once daily BEFORE BREAKFAST 30 tablet 6  .  magnesium chloride (SLOW-MAG) 64 MG TBEC SR tablet Take 1 tablet by mouth 2 (two) times daily.    . potassium chloride (K-DUR) 10 MEQ tablet take 1 tablet by mouth twice a day 180 tablet 1  . valACYclovir (VALTREX) 500 MG tablet Take 1 tablet (500 mg total) by mouth 2 (two) times daily. 60 tablet 3   No current facility-administered medications for this visit.     Objective:  Physical Examination:  BP 139/82 mmHg  Pulse 70  Temp(Src) 97.2 F (36.2 C) (Tympanic)  Resp 18  Ht 5\' 4"  (1.626 m)  Wt 206 lb 2.1 oz (93.5 kg)  BMI 35.36 kg/m2   Body mass index is 35.36 kg/(m^2).   ECOG Performance Status: 1 - Symptomatic but completely ambulatory  General appearance: alert, cooperative and appears stated age HEENT:PERRLA, extra ocular movement intact and sclera clear, anicteric Lymph node survey: non-palpable, inguinal Neurological exam reveals alert, oriented, normal speech, no focal findings  Pelvic: exam chaperoned by nurse; Vulva: vulvar scars after resection, erythema and slight raised mucosa at the vaginal vulvar junction at 7 o'clock biopsy site. The area of abnormality  seems to extend approximately 1.5 cm from 6-8 o'clock. I reviewed these findings with the patient.   Lab Review Labs on site today: none  Radiologic Imaging: n/a  Assessment:  Bailey Brooks is a 64 y.o. female diagnosed with recurrent Paget's disease of the vulva s/p WLE with positive margins.   Plan:   Problem List Items Addressed This Visit      Musculoskeletal and Integument   Paget disease, extramammary    Other Visit Diagnoses    Herpes zoster - Primary    Relevant Medications    valACYclovir (VALTREX) 500 MG tablet       I recommended vulvar wide local excision. Given her prior history and anatomy issues due to prior resection I will have to tailor our excision accordingly. We discussed the risks of surgery in detail. These include  infection, anesthesia, bleeding, transfusion, wound separation, medical issues (blood clots, stroke, heart attack, fluid in the lungs, pneumonia, abnormal heart rhythm, death), possible exploratory surgery with larger incision, allergic reaction, persistent scar tissue and pain. The patient agrees to proceed and we will plan to schedule Sep 29, 2015.   Gillis Ends, MD    CC:  Dr. Kenton Kingfisher.

## 2015-09-22 NOTE — Patient Instructions (Signed)
  Your procedure is scheduled on: Wednesday 09/29/2015 Report to Day Surgery. 2ND FLOOR MEDICAL MALL ENTRANCE To find out your arrival time please call [phone] between 1PM - 3PM on Tuesday 09/28/2015.  Remember: Instructions that are not followed completely may result in serious medical risk, up to and including death, or upon the discretion of your surgeon and anesthesiologist your surgery may need to be rescheduled.    __X__ 1. Do not eat food or drink liquids after midnight. No gum chewing or hard candies.     __X__ 2. No Alcohol for 24 hours before or after surgery.   ____ 3. Bring all medications with you on the day of surgery if instructed.    __X__ 4. Notify your doctor if there is any change in your medical condition     (cold, fever, infections).     Do not wear jewelry, make-up, hairpins, clips or nail polish.  Do not wear lotions, powders, or perfumes. You may wear deodorant.  Do not shave 48 hours prior to surgery. Men may shave face and neck.  Do not bring valuables to the hospital.    [facility] is not responsible for any belongings or valuables.               Contacts, dentures or bridgework may not be worn into surgery.  Leave your suitcase in the car. After surgery it may be brought to your room.  For patients admitted to the hospital, discharge time is determined by your                treatment team.   Patients discharged the day of surgery will not be allowed to drive home.   Please read over the following fact sheets that you were given:   Surgical Site Infection Prevention   __X__ Take these medicines the morning of surgery with A SIP OF WATER:    1. PREVACID  2. LEVOTHYROXINE  3. MAGNESIUM  4.  5.  6.  ____ Fleet Enema (as directed)   ____ Use CHG Soap as directed  ____ Use inhalers on the day of surgery  ____ Stop metformin 2 days prior to surgery    ____ Take 1/2 of usual insulin dose the night before surgery and none on the morning of  surgery.   ____ Stop Coumadin/Plavix/aspirin on   __X__ Stop Anti-inflammatories on HOLD IBUPROFEN  UNTIL AFTER SURGEY   ____ Stop supplements until after surgery.    ____ Bring C-Pap to the hospital.

## 2015-09-23 LAB — TYPE AND SCREEN
ABO/RH(D): O POS
Antibody Screen: NEGATIVE

## 2015-09-23 LAB — ABO/RH: ABO/RH(D): O POS

## 2015-09-29 ENCOUNTER — Encounter
Admission: RE | Disposition: A | Discharge status: Home / Self Care | Payer: Self-pay | Source: Ambulatory Visit | Attending: Obstetrics and Gynecology

## 2015-09-29 ENCOUNTER — Ambulatory Visit
Admission: RE | Admit: 2015-09-29 | Discharge: 2015-09-29 | Disposition: A | Discharge status: Home / Self Care | Payer: BLUE CROSS/BLUE SHIELD | Source: Ambulatory Visit | Attending: Obstetrics and Gynecology | Admitting: Obstetrics and Gynecology

## 2015-09-29 ENCOUNTER — Ambulatory Visit: Payer: BLUE CROSS/BLUE SHIELD | Admitting: Anesthesiology

## 2015-09-29 ENCOUNTER — Encounter: Payer: Self-pay | Admitting: *Deleted

## 2015-09-29 ENCOUNTER — Other Ambulatory Visit: Payer: Self-pay | Admitting: Obstetrics and Gynecology

## 2015-09-29 DIAGNOSIS — Z881 Allergy status to other antibiotic agents status: Secondary | ICD-10-CM | POA: Diagnosis not present

## 2015-09-29 DIAGNOSIS — Z8249 Family history of ischemic heart disease and other diseases of the circulatory system: Secondary | ICD-10-CM | POA: Insufficient documentation

## 2015-09-29 DIAGNOSIS — Z90710 Acquired absence of both cervix and uterus: Secondary | ICD-10-CM | POA: Insufficient documentation

## 2015-09-29 DIAGNOSIS — I2510 Atherosclerotic heart disease of native coronary artery without angina pectoris: Secondary | ICD-10-CM | POA: Insufficient documentation

## 2015-09-29 DIAGNOSIS — F4323 Adjustment disorder with mixed anxiety and depressed mood: Secondary | ICD-10-CM | POA: Diagnosis not present

## 2015-09-29 DIAGNOSIS — Z882 Allergy status to sulfonamides status: Secondary | ICD-10-CM | POA: Insufficient documentation

## 2015-09-29 DIAGNOSIS — Z808 Family history of malignant neoplasm of other organs or systems: Secondary | ICD-10-CM | POA: Insufficient documentation

## 2015-09-29 DIAGNOSIS — F329 Major depressive disorder, single episode, unspecified: Secondary | ICD-10-CM | POA: Insufficient documentation

## 2015-09-29 DIAGNOSIS — C519 Malignant neoplasm of vulva, unspecified: Secondary | ICD-10-CM | POA: Insufficient documentation

## 2015-09-29 DIAGNOSIS — Z91048 Other nonmedicinal substance allergy status: Secondary | ICD-10-CM | POA: Diagnosis not present

## 2015-09-29 DIAGNOSIS — M25561 Pain in right knee: Secondary | ICD-10-CM | POA: Insufficient documentation

## 2015-09-29 DIAGNOSIS — E039 Hypothyroidism, unspecified: Secondary | ICD-10-CM | POA: Diagnosis not present

## 2015-09-29 DIAGNOSIS — G4730 Sleep apnea, unspecified: Secondary | ICD-10-CM | POA: Insufficient documentation

## 2015-09-29 DIAGNOSIS — Z841 Family history of disorders of kidney and ureter: Secondary | ICD-10-CM | POA: Diagnosis not present

## 2015-09-29 DIAGNOSIS — Z8572 Personal history of non-Hodgkin lymphomas: Secondary | ICD-10-CM | POA: Insufficient documentation

## 2015-09-29 DIAGNOSIS — J309 Allergic rhinitis, unspecified: Secondary | ICD-10-CM | POA: Diagnosis not present

## 2015-09-29 DIAGNOSIS — Z8042 Family history of malignant neoplasm of prostate: Secondary | ICD-10-CM | POA: Insufficient documentation

## 2015-09-29 DIAGNOSIS — G629 Polyneuropathy, unspecified: Secondary | ICD-10-CM | POA: Insufficient documentation

## 2015-09-29 DIAGNOSIS — Z820 Family history of epilepsy and other diseases of the nervous system: Secondary | ICD-10-CM | POA: Insufficient documentation

## 2015-09-29 DIAGNOSIS — E785 Hyperlipidemia, unspecified: Secondary | ICD-10-CM | POA: Insufficient documentation

## 2015-09-29 DIAGNOSIS — Z9484 Stem cells transplant status: Secondary | ICD-10-CM | POA: Diagnosis not present

## 2015-09-29 DIAGNOSIS — J8482 Adult pulmonary Langerhans cell histiocytosis: Secondary | ICD-10-CM | POA: Diagnosis not present

## 2015-09-29 DIAGNOSIS — I10 Essential (primary) hypertension: Secondary | ICD-10-CM | POA: Diagnosis not present

## 2015-09-29 DIAGNOSIS — Z9889 Other specified postprocedural states: Principal | ICD-10-CM

## 2015-09-29 DIAGNOSIS — R112 Nausea with vomiting, unspecified: Secondary | ICD-10-CM

## 2015-09-29 DIAGNOSIS — L309 Dermatitis, unspecified: Secondary | ICD-10-CM | POA: Diagnosis not present

## 2015-09-29 DIAGNOSIS — Z888 Allergy status to other drugs, medicaments and biological substances status: Secondary | ICD-10-CM | POA: Diagnosis not present

## 2015-09-29 DIAGNOSIS — E669 Obesity, unspecified: Secondary | ICD-10-CM | POA: Diagnosis not present

## 2015-09-29 DIAGNOSIS — Z79899 Other long term (current) drug therapy: Secondary | ICD-10-CM | POA: Diagnosis not present

## 2015-09-29 DIAGNOSIS — E7800 Pure hypercholesterolemia, unspecified: Secondary | ICD-10-CM | POA: Diagnosis not present

## 2015-09-29 DIAGNOSIS — Z9049 Acquired absence of other specified parts of digestive tract: Secondary | ICD-10-CM | POA: Diagnosis not present

## 2015-09-29 DIAGNOSIS — Z8544 Personal history of malignant neoplasm of other female genital organs: Secondary | ICD-10-CM | POA: Diagnosis not present

## 2015-09-29 DIAGNOSIS — Z885 Allergy status to narcotic agent status: Secondary | ICD-10-CM | POA: Insufficient documentation

## 2015-09-29 DIAGNOSIS — R252 Cramp and spasm: Secondary | ICD-10-CM | POA: Diagnosis not present

## 2015-09-29 DIAGNOSIS — Z833 Family history of diabetes mellitus: Secondary | ICD-10-CM | POA: Insufficient documentation

## 2015-09-29 DIAGNOSIS — Z9221 Personal history of antineoplastic chemotherapy: Secondary | ICD-10-CM | POA: Diagnosis not present

## 2015-09-29 DIAGNOSIS — M1990 Unspecified osteoarthritis, unspecified site: Secondary | ICD-10-CM | POA: Diagnosis not present

## 2015-09-29 DIAGNOSIS — Z8673 Personal history of transient ischemic attack (TIA), and cerebral infarction without residual deficits: Secondary | ICD-10-CM | POA: Insufficient documentation

## 2015-09-29 DIAGNOSIS — C4499 Other specified malignant neoplasm of skin, unspecified: Secondary | ICD-10-CM | POA: Insufficient documentation

## 2015-09-29 HISTORY — PX: VULVECTOMY PARTIAL: SHX6187

## 2015-09-29 SURGERY — VULVECTOMY, PARTIAL
Anesthesia: General

## 2015-09-29 MED ORDER — FENTANYL CITRATE (PF) 100 MCG/2ML IJ SOLN
INTRAMUSCULAR | Status: AC
  Administered 2015-09-29: 25 ug via INTRAVENOUS
  Filled 2015-09-29: qty 2

## 2015-09-29 MED ORDER — MIDAZOLAM HCL 2 MG/2ML IJ SOLN
INTRAMUSCULAR | Status: DC | PRN
  Administered 2015-09-29: 1 mg via INTRAVENOUS

## 2015-09-29 MED ORDER — CEFAZOLIN SODIUM-DEXTROSE 2-3 GM-% IV SOLR
INTRAVENOUS | Status: DC | PRN
  Administered 2015-09-29: 2 g via INTRAVENOUS

## 2015-09-29 MED ORDER — ONDANSETRON HCL 4 MG/2ML IJ SOLN: 4.0000 mg | Freq: Once | INTRAMUSCULAR | Status: DC | PRN

## 2015-09-29 MED ORDER — CEFAZOLIN SODIUM-DEXTROSE 2-3 GM-% IV SOLR
2.0000 g | INTRAVENOUS | Status: AC
  Administered 2015-09-29: 2 g via INTRAVENOUS

## 2015-09-29 MED ORDER — FENTANYL CITRATE (PF) 100 MCG/2ML IJ SOLN
INTRAMUSCULAR | Status: DC | PRN
  Administered 2015-09-29 (×2): 50 ug via INTRAVENOUS

## 2015-09-29 MED ORDER — SUCCINYLCHOLINE CHLORIDE 20 MG/ML IJ SOLN
INTRAMUSCULAR | Status: DC | PRN
  Administered 2015-09-29: 100 mg via INTRAVENOUS

## 2015-09-29 MED ORDER — CEFAZOLIN SODIUM-DEXTROSE 2-3 GM-% IV SOLR
INTRAVENOUS | Status: AC
  Administered 2015-09-29: 2 g via INTRAVENOUS
  Filled 2015-09-29: qty 50

## 2015-09-29 MED ORDER — BUPIVACAINE HCL (PF) 0.25 % IJ SOLN
INTRAMUSCULAR | Status: DC | PRN
  Administered 2015-09-29 (×2): 10 mL

## 2015-09-29 MED ORDER — LACTATED RINGERS IV SOLN
INTRAVENOUS | Status: DC
  Administered 2015-09-29: 75 mL/h via INTRAVENOUS
  Administered 2015-09-29 (×2): via INTRAVENOUS

## 2015-09-29 MED ORDER — IBUPROFEN 800 MG PO TABS
800.0000 mg | ORAL_TABLET | Freq: Three times a day (TID) | ORAL | Status: DC | PRN
  Filled 2015-09-29: qty 1

## 2015-09-29 MED ORDER — LIDOCAINE HCL (CARDIAC) 20 MG/ML IV SOLN
INTRAVENOUS | Status: DC | PRN
  Administered 2015-09-29: 40 mg via INTRAVENOUS

## 2015-09-29 MED ORDER — PROPOFOL 10 MG/ML IV BOLUS
INTRAVENOUS | Status: DC | PRN
  Administered 2015-09-29: 170 mg via INTRAVENOUS
  Administered 2015-09-29: 30 mg via INTRAVENOUS

## 2015-09-29 MED ORDER — BENZOCAINE-MENTHOL 20-0.5 % EX AERO
1.0000 "application " | INHALATION_SPRAY | Freq: Four times a day (QID) | CUTANEOUS | Status: DC | PRN
  Filled 2015-09-29: qty 56

## 2015-09-29 MED ORDER — GLYCOPYRROLATE 0.2 MG/ML IJ SOLN
INTRAMUSCULAR | Status: DC | PRN
  Administered 2015-09-29 (×2): .6 mg via INTRAVENOUS

## 2015-09-29 MED ORDER — PRAMOXINE-HC 1-1 % EX FOAM: Freq: Three times a day (TID) | CUTANEOUS | Status: DC

## 2015-09-29 MED ORDER — ONDANSETRON HCL 4 MG/2ML IJ SOLN
INTRAMUSCULAR | Status: DC | PRN
  Administered 2015-09-29: 4 mg via INTRAVENOUS

## 2015-09-29 MED ORDER — DEXAMETHASONE SODIUM PHOSPHATE 4 MG/ML IJ SOLN
INTRAMUSCULAR | Status: DC | PRN
  Administered 2015-09-29: 10 mg via INTRAVENOUS

## 2015-09-29 MED ORDER — BUPIVACAINE HCL (PF) 0.25 % IJ SOLN
INTRAMUSCULAR | Status: AC
  Filled 2015-09-29: qty 30

## 2015-09-29 MED ORDER — ROCURONIUM BROMIDE 100 MG/10ML IV SOLN
INTRAVENOUS | Status: DC | PRN
  Administered 2015-09-29: 35 mg via INTRAVENOUS
  Administered 2015-09-29: 5 mg via INTRAVENOUS

## 2015-09-29 MED ORDER — NEOSTIGMINE METHYLSULFATE 10 MG/10ML IV SOLN
INTRAVENOUS | Status: DC | PRN
  Administered 2015-09-29: 4 mg via INTRAVENOUS

## 2015-09-29 MED ORDER — ACETIC ACID 3 % SOLN
Status: AC
  Filled 2015-09-29: qty 1000

## 2015-09-29 MED ORDER — FENTANYL CITRATE (PF) 100 MCG/2ML IJ SOLN
25.0000 ug | INTRAMUSCULAR | Status: DC | PRN
  Administered 2015-09-29: 25 ug via INTRAVENOUS

## 2015-09-29 MED ORDER — ONDANSETRON HCL 8 MG PO TABS: 8.0000 mg | ORAL_TABLET | Freq: Three times a day (TID) | ORAL | Status: DC | PRN

## 2015-09-29 SURGICAL SUPPLY — 36 items
BLADE SURG 15 STRL LF DISP TIS (BLADE) ×1 IMPLANT
BLADE SURG 15 STRL SS (BLADE) ×1
BLADE SURG SZ10 CARB STEEL (BLADE) ×2 IMPLANT
CANISTER SUCT 1200ML W/VALVE (MISCELLANEOUS) ×2 IMPLANT
CATH ROBINSON RED A/P 16FR (CATHETERS) ×2 IMPLANT
CNTNR SPEC 2.5X3XGRAD LEK (MISCELLANEOUS) ×1
CONT SPEC 4OZ STER OR WHT (MISCELLANEOUS) ×1
CONTAINER SPEC 2.5X3XGRAD LEK (MISCELLANEOUS) ×1 IMPLANT
DRAPE PERI LITHO V/GYN (MISCELLANEOUS) ×2 IMPLANT
DRAPE UNDER BUTTOCK W/FLU (DRAPES) ×2 IMPLANT
DRSG TELFA 3X8 NADH (GAUZE/BANDAGES/DRESSINGS) ×2 IMPLANT
ELECT CAUTERY BLADE 6.4 (BLADE) ×2 IMPLANT
ELECT CAUTERY NEEDLE TIP 1.0 (MISCELLANEOUS) ×2
ELECTRODE CAUTERY NEDL TIP 1.0 (MISCELLANEOUS) ×1 IMPLANT
GLOVE BIO SURGEON STRL SZ8 (GLOVE) ×8 IMPLANT
GLOVE INDICATOR 8.0 STRL GRN (GLOVE) ×8 IMPLANT
GOWN STRL REUS W/ TWL LRG LVL3 (GOWN DISPOSABLE) ×1 IMPLANT
GOWN STRL REUS W/ TWL XL LVL3 (GOWN DISPOSABLE) ×1 IMPLANT
GOWN STRL REUS W/TWL LRG LVL3 (GOWN DISPOSABLE) ×1
GOWN STRL REUS W/TWL XL LVL3 (GOWN DISPOSABLE) ×1
NDL SAFETY 22GX1.5 (NEEDLE) ×2 IMPLANT
NS IRRIG 500ML POUR BTL (IV SOLUTION) ×2 IMPLANT
PACK BASIN MINOR ARMC (MISCELLANEOUS) ×2 IMPLANT
PAD GROUND ADULT SPLIT (MISCELLANEOUS) ×2 IMPLANT
PAD OB MATERNITY 4.3X12.25 (PERSONAL CARE ITEMS) ×2 IMPLANT
PAD PREP 24X41 OB/GYN DISP (PERSONAL CARE ITEMS) ×2 IMPLANT
SOL PREP PVP 2OZ (MISCELLANEOUS) ×2
SOLUTION PREP PVP 2OZ (MISCELLANEOUS) ×1 IMPLANT
SPONGE XRAY 4X4 16PLY STRL (MISCELLANEOUS) ×4 IMPLANT
SUT VIC AB 2-0 SH 27 (SUTURE) ×4
SUT VIC AB 2-0 SH 27XBRD (SUTURE) ×4 IMPLANT
SUT VIC AB 3-0 SH 27 (SUTURE) ×1
SUT VIC AB 3-0 SH 27X BRD (SUTURE) ×1 IMPLANT
SUT VIC AB 4-0 SH 27 (SUTURE) ×1
SUT VIC AB 4-0 SH 27XANBCTRL (SUTURE) ×1 IMPLANT
SYR CONTROL 10ML (SYRINGE) ×2 IMPLANT

## 2015-09-29 NOTE — Discharge Instructions (Signed)
Vulvar Wide Local Excision, Care After Refer to this sheet in the next few weeks. These instructions provide you with information on caring for yourself after your procedure. Your health care provider may also give you more specific instructions. Your treatment has been planned according to current medical practices, but problems sometimes occur. Call your health care provider if you have any problems or questions after your procedure.   WHAT TO EXPECT AFTER THE PROCEDURE After your procedure, it is typical to have the following:  Pelvic and vulvar discomfort and pain  Feeling tired.  Minimal bleeding from the wound  HOME CARE INSTRUCTIONS  It takes 4-6 weeks to recover from this surgery. Make sure you follow all your health care provider's instructions. Home care instructions may include:  Take over the counter pain medicines as needed.   Take ibuprofen, tylenol, as discussed for pain.   May take ibuprofen up to 800 mg three times a day  as needed pain  May take tylenol 325 mg every 4-6 hours as needed pain  Take sitz bath two twice a day, clean wound, rinse, blot and blow dry and also perform after every bowel movement.   You may also take showers in addition to sitz baths.   Do not douche, use tampons, or have sexual intercourse for at least 4 weeks or until your health care provider says you can.   If you are having difficulty urinating you can try urinating in the bath tub or shower, or use spray bottle to dilute urine and this will decrease pain.   Apply xylocaine gel or Epifoam to area as need for pain. You also try dermaplast, over the counter spray, for pain.   You can resume your normal diet     If you are constipated, you can take a mild laxative.  Eating foods high in fiber may also help with constipation. Eat plenty of raw fruits and vegetables, whole grains, and beans.  Drink enough fluids to keep your urine clear or pale yellow.   Try to have someone at  home with you for the first week to help around the house.  Keep all follow-up appointments.   SEEK MEDICAL CARE IF:   You have chills or fever.  You feel dizzy or light-headed.   You have pain or bleeding when you urinate.   You have persistent nausea and vomiting.    You you have heavy bleeding  Your pain medicine is not helping.   SEEK IMMEDIATE MEDICAL CARE IF:   You have a fever and your symptoms suddenly get worse.  You have severe abdominal pain.  You have chest pain.  You have shortness of breath.  You faint.  You have pain, swelling, or redness of your leg.  You have very heavy vaginal or vulvar bleeding with blood clots. MAKE SURE YOU:  Understand these instructions.  Will watch your condition.  Will get help right away if you are not doing well or get worse.  Our office will contact you to schedule your follow up appointment in 4-6 weeks for your postoperative check.

## 2015-09-29 NOTE — Transfer of Care (Signed)
Immediate Anesthesia Transfer of Care Note  Patient: Bailey Brooks  Procedure(s) Performed: Procedure(s): VULVECTOMY PARTIAL (N/A)  Patient Location: PACU  Anesthesia Type:General  Level of Consciousness: sedated  Airway & Oxygen Therapy: Patient Spontanous Breathing and Patient connected to face mask oxygen  Post-op Assessment: Report given to RN and Post -op Vital signs reviewed and stable  Post vital signs: Reviewed and stable  Last Vitals:  Filed Vitals:   09/29/15 0621  BP: 158/72  Pulse: 6  Temp: 36.7 C  Resp: 16    Complications: No apparent anesthesia complications

## 2015-09-29 NOTE — Anesthesia Postprocedure Evaluation (Signed)
  Anesthesia Post-op Note  Patient: Bailey Brooks  Procedure(s) Performed: Procedure(s): VULVECTOMY PARTIAL (N/A)  Anesthesia type:General  Patient location: PACU  Post pain: Pain level controlled  Post assessment: Post-op Vital signs reviewed, Patient's Cardiovascular Status Stable, Respiratory Function Stable, Patent Airway and No signs of Nausea or vomiting  Post vital signs: Reviewed and stable  Last Vitals:  Filed Vitals:   09/29/15 1048  BP: 127/56  Pulse: 58  Temp: 36.8 C  Resp: 16    Level of consciousness: awake, alert  and patient cooperative  Complications: No apparent anesthesia complications

## 2015-09-29 NOTE — Anesthesia Preprocedure Evaluation (Addendum)
Anesthesia Evaluation  Patient identified by MRN, date of birth, ID band Patient awake    Reviewed: Allergy & Precautions, NPO status , Patient's Chart, lab work & pertinent test results  Airway Mallampati: II  TM Distance: >3 FB Neck ROM: Full    Dental   Pulmonary neg sleep apnea,    breath sounds clear to auscultation       Cardiovascular METS: 3 - Mets hypertension, (-) angina(-) CAD, (-) Past MI and (-) Cardiac Stents  Rhythm:Regular Rate:Normal     Neuro/Psych    GI/Hepatic negative GI ROS, Neg liver ROS, GERD  Controlled,  Endo/Other  Hypothyroidism   Renal/GU   Female GU complaint     Musculoskeletal  (+) Arthritis , Osteoarthritis,    Abdominal   Peds  Hematology   Anesthesia Other Findings   Reproductive/Obstetrics                           Anesthesia Physical Anesthesia Plan  ASA: II  Anesthesia Plan: General   Post-op Pain Management:    Induction: Intravenous  Airway Management Planned: Oral ETT  Additional Equipment:   Intra-op Plan:   Post-operative Plan: Extubation in OR  Informed Consent: I have reviewed the patients History and Physical, chart, labs and discussed the procedure including the risks, benefits and alternatives for the proposed anesthesia with the patient or authorized representative who has indicated his/her understanding and acceptance.     Plan Discussed with: CRNA  Anesthesia Plan Comments:         Anesthesia Quick Evaluation

## 2015-09-29 NOTE — Op Note (Signed)
   Operative Note  08/04/2015 12:25 PM  PRE-OP DIAGNOSIS: Recurrent Paget's Disease of the Vulva   POST-OP DIAGNOSIS: Recurrent Paget's Disease  of the Vulva  SURGEON: Surgeon(s) and Role:  * Haniah Penny Gaetana Michaelis, MD - Primary  ANESTHESIA: Choice   PROCEDURE: Procedure(s): Exam under anesthesia, partial right vulvectomy, and right vulvar biopsy.    ESTIMATED BLOOD LOSS: less than 50 mL  DRAINS: NONE   SPECIMENS: NONE   COMPLICATIONS: NONE   DISPOSITION: PACU - hemodynamically stable.  CONDITION: stable  INDICATIONS: Symptomatic recurrent Paget's disease of the vulva.  FINDINGS: Exam under anesthesia revealed red beefy tissue involving the right introitus. In addition there was clear demarcation between normal skin and erythematous skin of the vulva. This area was not visually definitive of Paget's disease. There were no other lesions. Vaginal exam was normal and BME negative for masses or nodularity.   PROCEDURE IN DETAIL: After informed consent was obtained, the patient was taken to the operating room where anesthesia was obtained without difficulty. The patient was positioned in the dorsal lithotomy position in Candycane stirrups. Time-out was performed. The patient was examined under anesthesia, and the above findings were noted. She was prepped with Hibiclens and draped using the appropriate sterile technique. A partial simple vulvectomy of the lesion on the right was performed with 1.0 cm margins laterally and 0.05 cm margin medial. The medial margin was difficult to obtain given the extension of the lesion into the vagina. The defect was 3 x 4 cm after resection. The skin was closed in layers of 2-0 Vicryl. The skin was approximated with serial mattress sutures. A right vulvar biopsy at 11 o'clock was performed. Hemostasis was obtained with cauterization. The patient tolerated the procedure well. Sponge, lap and needle counts were correct x2. The patient was taken to  recovery room in excellent condition.  Antibiotics: Given 1st or 2nd generation cephalosporin, Antibiotics given within 1 hour of the start of the procedure, Antibiotics ordered to be discontinued within 24 hours post procedure   Wound: clean    VTE prophylaxis: was ordered perioperatively.    Gillis Ends, MD

## 2015-09-29 NOTE — Anesthesia Procedure Notes (Addendum)
Procedure Name: Intubation Date/Time: 09/29/2015 7:45 AM Performed by: Allean Found Pre-anesthesia Checklist: Patient identified, Emergency Drugs available, Suction available, Patient being monitored and Timeout performed Patient Re-evaluated:Patient Re-evaluated prior to inductionOxygen Delivery Method: Circle system utilized Preoxygenation: Pre-oxygenation with 100% oxygen Intubation Type: IV induction and Cricoid Pressure applied Ventilation: Mask ventilation without difficulty Laryngoscope Size: Mac and 3 Grade View: Grade II Tube type: Oral Number of attempts: 2 Airway Equipment and Method: Bougie stylet and Stylet Placement Confirmation: ETT inserted through vocal cords under direct vision,  positive ETCO2 and breath sounds checked- equal and bilateral Secured at: 21 cm Tube secured with: Tape Dental Injury: Teeth and Oropharynx as per pre-operative assessment    Performed by: Bianna Haran Difficulty Due To: Difficulty was anticipated and Difficult Airway- due to anterior larynx Comments: Could pass ett with traditional hockey stick bend and stylet.  Passed bougie easily and threaded ett over bougie.  Given steroids IV

## 2015-09-30 ENCOUNTER — Encounter: Payer: Self-pay | Admitting: Obstetrics and Gynecology

## 2015-10-05 LAB — SURGICAL PATHOLOGY

## 2015-10-07 ENCOUNTER — Encounter: Payer: Self-pay | Admitting: Internal Medicine

## 2015-10-07 ENCOUNTER — Ambulatory Visit (INDEPENDENT_AMBULATORY_CARE_PROVIDER_SITE_OTHER): Payer: BLUE CROSS/BLUE SHIELD | Admitting: Internal Medicine

## 2015-10-07 VITALS — BP 143/77 | HR 70 | Temp 98.0°F | Ht 64.0 in | Wt 211.0 lb

## 2015-10-07 DIAGNOSIS — S46901S Unspecified injury of unspecified muscle, fascia and tendon at shoulder and upper arm level, right arm, sequela: Secondary | ICD-10-CM

## 2015-10-07 DIAGNOSIS — E039 Hypothyroidism, unspecified: Secondary | ICD-10-CM | POA: Diagnosis not present

## 2015-10-07 DIAGNOSIS — C4499 Other specified malignant neoplasm of skin, unspecified: Secondary | ICD-10-CM | POA: Diagnosis not present

## 2015-10-07 DIAGNOSIS — C519 Malignant neoplasm of vulva, unspecified: Secondary | ICD-10-CM

## 2015-10-07 LAB — TSH: TSH: 3.25 u[IU]/mL (ref 0.35–4.50)

## 2015-10-07 NOTE — Progress Notes (Signed)
Subjective:    Patient ID: Bailey Brooks, female    DOB: 08-17-1951, 64 y.o.   MRN: [id number]  HPI  64YO female presents for follow up.  Recently treated for candidiasis of the skin. Also recently underwent vulvectomy on 09/29/2015 for Paget's disease of the vulva.  Tolerated surgery well. Not having any pain or swelling at site. Also had Lidocaine cream, but did not use this. Used Dermaplast spray. No issues with urination or defacation. Follow up with GYN pending.  Yeast infection resolved.  Right shoulder mobility- Still having some decreased mobility in right shoulder. Using a muscle stimulator in PT to help improve mobility. Doing this twice weekly. Has follow up with ortho in November.  Wt Readings from Last 3 Encounters:  10/07/15 211 lb (95.709 kg)  09/29/15 209 lb (94.802 kg)  09/22/15 209 lb 9.6 oz (95.074 kg)   BP Readings from Last 3 Encounters:  10/07/15 143/77  09/29/15 127/56  09/22/15 124/58    Past Medical History  Diagnosis Date  . 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 Madelynn Done s/p RCHOP and methotrexate, c/b renal failure  . Hypothyroidism   . Paget disease, extra mammary     vulva, s/p resection 2014, Dr. Sabra Heck  . Allergic rhinitis   . CAD (coronary artery disease)   . Arthritis   . Peripheral neuropathy ([place])   . Sleep apnea   . 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 H/O stem cell transplant [facility]) Jul 2012  . Large cell lymphoma ([place])   . History of chemotherapy 22-Oct-2010  . Hypertension   . Hypercholesterolemia    Family History  Problem Relation Age of Onset  . Diabetes Mother   . Hypertension Mother   . Melanoma Father 48    Melanoma  . Kidney disease Sister     Kidney removed   . Prostate cancer Brother     Prostate - dx in 45's  . Hearing loss Maternal Aunt   . Hearing loss Maternal Grandfather   . Heart disease Brother 29    MI & DVT in  heart   Past Surgical History  Procedure Laterality Date  . Tonsillectomy  1954  . Limbal stem cell transplant  2012  . Liver biopsy      stage 4B large Bcell lymphoma  . Burr hole w/ placement ommaya reservoir    . Breast biopsy  2002    Neg - AT [facility]  . Abdominal hysterectomy  1985    Hysterectomy-partial  . Vulvectomy    . Shoulder arthroscopy Right 06/22/2015    Procedure: ARTHROSCOPY SHOULDER, parital repair of rotator cuff, biceps tenodesis, decompression and debridement;  Surgeon: Corky Mull, MD;  Location: [facility] ORS;  Service: Orthopedics;  Laterality: Right;  . Insertion central venous access device w/ subcutaneous port  2011    Port a Cath: Right chest Double Lumen, 04-Nov-2010  . Vulvectomy partial N/A 09/29/2015    Procedure: VULVECTOMY PARTIAL;  Surgeon: Gillis Ends, MD;  Location: [facility] ORS;  Service: Gynecology;  Laterality: N/A;   Social History   Social History  . Marital Status: Married    Spouse Name: N/A  . Number of Children: 2  . Years of Education: N/A   Occupational History  . 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 Smoking status: Never Smoker   .  Smokeless tobacco: Never Used  . Alcohol Use: No     Comment: former user-no current use  . Drug Use: No  . Sexual Activity:    Partners: Male   Other Topics Concern  . None   Social History Narrative   Lives in [place] with husband, has 2 children boys.  No pets. 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    Review of Systems  Constitutional: Negative for fever, chills, appetite change, fatigue and unexpected weight change.  Eyes: Negative for visual disturbance.  Respiratory: Negative for shortness of breath.   Cardiovascular: Negative for chest pain and leg swelling.  Gastrointestinal: Negative for vomiting, abdominal pain, diarrhea and constipation.  Genitourinary: Negative for dysuria, vaginal bleeding, vaginal discharge, difficulty urinating, vaginal pain  and pelvic pain.  Musculoskeletal: Positive for myalgias and arthralgias.  Skin: Negative for color change and rash.  Hematological: Negative for adenopathy. Does not bruise/bleed easily.  Psychiatric/Behavioral: Negative for sleep disturbance and dysphoric mood. The patient is not nervous/anxious.        Objective:    BP 143/77 mmHg  Pulse 70  Temp(Src) 98 F (36.7 C) (Oral)  Ht 5\' 4"  (1.626 m)  Wt 211 lb (95.709 kg)  BMI 36.20 kg/m2  SpO2 96% Physical Exam  Constitutional: She is oriented to person, place, and time. She appears well-developed and well-nourished. No distress.  HENT:  Head: Normocephalic and atraumatic.  Right Ear: External ear normal.  Left Ear: External ear normal.  Nose: Nose normal.  Mouth/Throat: Oropharynx is clear and moist. No oropharyngeal exudate.  Eyes: Conjunctivae are normal. Pupils are equal, round, and reactive to light. Right eye exhibits no discharge. Left eye exhibits no discharge. No scleral icterus.  Neck: Normal range of motion. Neck supple. No tracheal deviation present. No thyromegaly present.  Cardiovascular: Normal rate, regular rhythm, normal heart sounds and intact distal pulses.  Exam reveals no gallop and no friction rub.   No murmur heard. Pulmonary/Chest: Effort normal and breath sounds normal. No respiratory distress. She has no wheezes. She has no rales. She exhibits no tenderness.  Musculoskeletal: She exhibits no edema or tenderness.       Right shoulder: She exhibits decreased range of motion (Decreased abduction) and decreased strength. She exhibits no tenderness and no pain.  Lymphadenopathy:    She has no cervical adenopathy.  Neurological: She is alert and oriented to person, place, and time. No cranial nerve deficit. She exhibits normal muscle tone. Coordination normal.  Skin: Skin is warm and dry. No rash noted. She is not diaphoretic. No erythema. No pallor.  Psychiatric: She has a normal mood and affect. Her behavior is  normal. Judgment and thought content normal.          Assessment & Plan:   Problem List Items Addressed This Visit      Unprioritized   Hypothyroidism    Will repeat TSH with labs today. Continue Levothyroxine.      Relevant Orders   TSH   Injury of tendon of upper extremity    Continue PT. Continue to follow up with Ortho.      Paget's disease of vulva - Primary    S/p vulvectomy. Follow up with GYN as scheduled.          Return in about 6 months (around 04/05/2016) for Recheck.

## 2015-10-07 NOTE — Assessment & Plan Note (Signed)
Continue PT. Continue to follow up with Ortho.

## 2015-10-07 NOTE — Assessment & Plan Note (Signed)
Will repeat TSH with labs today. Continue Levothyroxine.

## 2015-10-07 NOTE — Progress Notes (Signed)
Pre visit review using our clinic review tool, if applicable. No additional management support is needed unless otherwise documented below in the visit note. 

## 2015-10-07 NOTE — Patient Instructions (Signed)
Labs today.  Follow up in 6 months. 

## 2015-10-07 NOTE — Assessment & Plan Note (Signed)
S/p vulvectomy. Follow up with GYN as scheduled.

## 2015-10-11 NOTE — Telephone Encounter (Signed)
I tried to contact the pharmacist at [facility] in response to this email. I was on hold for 8 minutes and was not able to talk to the pharmacist.  Gillis Ends, MD       The following prescriptions have been discontinued, but the pharmacy has not been notified. To stop the pharmacy from filling this prescription, you must call it directly.   Contact your system administrator for assistance if you would like these messages to be sent to support staff instead of directly to you.      Pharmacy     [address]    [place] [address]    Phone: [phone] Fax: [phone]    Open 24 Hours?: No          Orders     pramoxine-hydrocortisone (EPIFOAM) 1-1 % foam    ondansetron (ZOFRAN) 8 MG tablet

## 2015-10-22 ENCOUNTER — Encounter: Payer: Self-pay | Admitting: *Deleted

## 2015-10-22 ENCOUNTER — Other Ambulatory Visit: Payer: Self-pay | Admitting: *Deleted

## 2015-10-22 ENCOUNTER — Inpatient Hospital Stay (HOSPITAL_BASED_OUTPATIENT_CLINIC_OR_DEPARTMENT_OTHER): Payer: BLUE CROSS/BLUE SHIELD | Admitting: Internal Medicine

## 2015-10-22 ENCOUNTER — Inpatient Hospital Stay: Payer: BLUE CROSS/BLUE SHIELD | Attending: Internal Medicine

## 2015-10-22 VITALS — BP 135/84 | HR 71 | Temp 97.6°F | Resp 18 | Ht 64.0 in | Wt 214.7 lb

## 2015-10-22 DIAGNOSIS — E7800 Pure hypercholesterolemia, unspecified: Secondary | ICD-10-CM | POA: Diagnosis not present

## 2015-10-22 DIAGNOSIS — I10 Essential (primary) hypertension: Secondary | ICD-10-CM | POA: Insufficient documentation

## 2015-10-22 DIAGNOSIS — Z9484 Stem cells transplant status: Secondary | ICD-10-CM | POA: Insufficient documentation

## 2015-10-22 DIAGNOSIS — M75101 Unspecified rotator cuff tear or rupture of right shoulder, not specified as traumatic: Secondary | ICD-10-CM

## 2015-10-22 DIAGNOSIS — Z8572 Personal history of non-Hodgkin lymphomas: Secondary | ICD-10-CM | POA: Insufficient documentation

## 2015-10-22 DIAGNOSIS — G629 Polyneuropathy, unspecified: Secondary | ICD-10-CM | POA: Diagnosis not present

## 2015-10-22 DIAGNOSIS — C519 Malignant neoplasm of vulva, unspecified: Secondary | ICD-10-CM | POA: Diagnosis not present

## 2015-10-22 DIAGNOSIS — Z9221 Personal history of antineoplastic chemotherapy: Secondary | ICD-10-CM | POA: Insufficient documentation

## 2015-10-22 DIAGNOSIS — I2510 Atherosclerotic heart disease of native coronary artery without angina pectoris: Secondary | ICD-10-CM | POA: Diagnosis not present

## 2015-10-22 DIAGNOSIS — E039 Hypothyroidism, unspecified: Secondary | ICD-10-CM | POA: Insufficient documentation

## 2015-10-22 DIAGNOSIS — Z90710 Acquired absence of both cervix and uterus: Secondary | ICD-10-CM | POA: Diagnosis not present

## 2015-10-22 DIAGNOSIS — G4730 Sleep apnea, unspecified: Secondary | ICD-10-CM

## 2015-10-22 DIAGNOSIS — C966 Unifocal Langerhans-cell histiocytosis: Secondary | ICD-10-CM | POA: Insufficient documentation

## 2015-10-22 DIAGNOSIS — Z79899 Other long term (current) drug therapy: Secondary | ICD-10-CM | POA: Diagnosis not present

## 2015-10-22 DIAGNOSIS — C8590 Non-Hodgkin lymphoma, unspecified, unspecified site: Secondary | ICD-10-CM

## 2015-10-22 DIAGNOSIS — C8330 Diffuse large B-cell lymphoma, unspecified site: Secondary | ICD-10-CM | POA: Diagnosis present

## 2015-10-22 LAB — CBC WITH DIFFERENTIAL/PLATELET
Basophils Absolute: 0 10*3/uL (ref 0–0.1)
Basophils Relative: 1 %
EOS PCT: 1 %
Eosinophils Absolute: 0.1 10*3/uL (ref 0–0.7)
HCT: 41.7 % (ref 35.0–47.0)
Hemoglobin: 14 g/dL (ref 12.0–16.0)
LYMPHS ABS: 1.1 10*3/uL (ref 1.0–3.6)
LYMPHS PCT: 28 %
MCH: 30.2 pg (ref 26.0–34.0)
MCHC: 33.6 g/dL (ref 32.0–36.0)
MCV: 89.7 fL (ref 80.0–100.0)
MONO ABS: 0.4 10*3/uL (ref 0.2–0.9)
MONOS PCT: 9 %
Neutro Abs: 2.4 10*3/uL (ref 1.4–6.5)
Neutrophils Relative %: 61 %
PLATELETS: 188 10*3/uL (ref 150–440)
RBC: 4.65 MIL/uL (ref 3.80–5.20)
RDW: 14.2 % (ref 11.5–14.5)
WBC: 3.9 10*3/uL (ref 3.6–11.0)

## 2015-10-22 LAB — HEPATIC FUNCTION PANEL
ALK PHOS: 76 U/L (ref 38–126)
ALT: 18 U/L (ref 14–54)
AST: 20 U/L (ref 15–41)
Albumin: 3.6 g/dL (ref 3.5–5.0)
TOTAL PROTEIN: 6.9 g/dL (ref 6.5–8.1)
Total Bilirubin: 0.6 mg/dL (ref 0.3–1.2)

## 2015-10-22 LAB — CREATININE, SERUM
Creatinine, Ser: 0.87 mg/dL (ref 0.44–1.00)
GFR calc non Af Amer: >60 mL/min (ref >60)

## 2015-10-22 LAB — LACTATE DEHYDROGENASE: LDH: 132 U/L (ref 98–192)

## 2015-10-22 NOTE — Progress Notes (Signed)
Patient is here for follow-up of lymphoma. She had surgery to repair a rotator cuff in July and then she had surgery two with ago for a recurrence of Paget's disease. Patient states that overall she has been doing well, but still has some trouble lifting and moving her shoulder.

## 2015-10-22 NOTE — Progress Notes (Signed)
[facility] OFFICE PROGRESS NOTE  Patient Care Team: Jackolyn Confer, MD as PCP - General (Internal Medicine)   SUMMARY OF ONCOLOGIC HISTORY:  # 2011- DLBCL with mesenteric mass & liver involvement s/p chemo- s/p Autotransplant [JUNE 2012; [facility]] s/p IT  # 2014-Pagets disease of Vulva s/p resection [Dr.Secord]; last re-exciosn Oct 2016   INTERVAL HISTORY:  This is my first interaction with the patient since I joined the practice September 2016. I reviewed the patient's prior charts/pertinent labs/imaging in detail; findings are summarized above.   64 year old female patient with above history of diffuse large B-cell lymphoma stage IV status post autologous stem cell transplant in 2012 is here for follow-up.  Patient has right shoulder pain/secondary to Rotator cuff tear. Otherwise denies any unusual shortness of breath cough or abdominal pain distention night sweats or weight loss.  Requesting to come off- potassium magnesium and also Valtrex  REVIEW OF SYSTEMS:  A complete 10 point review of system is done which is negative except mentioned above/history of present illness.   PAST MEDICAL HISTORY :  Past Medical History  Diagnosis Date  . 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 Madelynn Done s/p RCHOP and methotrexate, c/b renal failure  . Hypothyroidism   . Paget disease, extra mammary     vulva, s/p resection 2014, Dr. Sabra Heck  . Allergic rhinitis   . CAD (coronary artery disease)   . Arthritis   . Peripheral neuropathy ([place])   . Sleep apnea   . 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 H/O stem cell transplant [facility]) Jul 2012  . Large cell lymphoma ([place])   . History of chemotherapy 22-Oct-2010    RHCOP/methotrexate-intrathecal  . Hypertension   . Hypercholesterolemia   . Esophagitis   . Hemorrhoids   . Diverticulosis     PAST SURGICAL HISTORY :   Past Surgical History   Procedure Laterality Date  . Tonsillectomy  1954  . Limbal stem cell transplant  2012  . Liver biopsy      stage 4B large Bcell lymphoma  . Burr hole w/ placement ommaya reservoir    . Breast biopsy  2002    Neg - AT [facility]  . Abdominal hysterectomy  1985    Hysterectomy-partial  . Vulvectomy    . Shoulder arthroscopy Right 06/22/2015    Procedure: ARTHROSCOPY SHOULDER, parital repair of rotator cuff, biceps tenodesis, decompression and debridement;  Surgeon: Corky Mull, MD;  Location: [facility] ORS;  Service: Orthopedics;  Laterality: Right;  . Insertion central venous access device w/ subcutaneous port  2011    Port a Cath: Right chest Double Lumen, 04-Nov-2010  . Vulvectomy partial N/A 09/29/2015    Procedure: VULVECTOMY PARTIAL;  Surgeon: Gillis Ends, MD;  Location: [facility] ORS;  Service: Gynecology;  Laterality: N/A;  . Colonoscopy  04/2013  . Esophagogastroduodenoscopy  04/2013  . Burr hole w/ placement ommaya reservoir  2012    FAMILY HISTORY :   Family History  Problem Relation Age of Onset  . Diabetes Mother   . Hypertension Mother   . Melanoma Father 34    Melanoma  . Kidney disease Sister     Kidney removed   . Prostate cancer Brother     Prostate - dx in 51's  . Hearing loss Maternal Aunt   . Hearing loss Maternal Grandfather   . Heart disease Brother 66  MI & DVT in heart    SOCIAL HISTORY:   Social History  Substance Use Topics  . Smoking status: Never Smoker   . Smokeless tobacco: Never Used  . Alcohol Use: No     Comment: former user-no current use    ALLERGIES:  is allergic to erythromycin; iodine; morphine and related; sulfa antibiotics; and adhesive.  MEDICATIONS:  Current Outpatient Prescriptions  Medication Sig Dispense Refill  . Cholecalciferol (VITAMIN D) 2000 UNITS tablet Take 2,000 Units by mouth 2 (two) times daily.    . lansoprazole (PREVACID) 30 MG capsule take 1 capsule by mouth once daily 30 capsule 5  . levothyroxine  (SYNTHROID, LEVOTHROID) 75 MCG tablet take 1 tablet by mouth once daily BEFORE BREAKFAST 30 tablet 6  . magnesium chloride (SLOW-MAG) 64 MG TBEC SR tablet Take 1 tablet by mouth 2 (two) times daily.    . potassium chloride (K-DUR) 10 MEQ tablet take 1 tablet by mouth twice a day 180 tablet 1  . valACYclovir (VALTREX) 500 MG tablet Take 1 tablet (500 mg total) by mouth 2 (two) times daily. 60 tablet 3   No current facility-administered medications for this visit.    PHYSICAL EXAMINATION: ECOG PERFORMANCE STATUS: 0 - Asymptomatic  BP 135/84 mmHg  Pulse 71  Temp(Src) 97.6 F (36.4 C) (Tympanic)  Resp 18  Ht 5\' 4"  (1.626 m)  Wt 214 lb 11.7 oz (97.4 kg)  BMI 36.84 kg/m2  Filed Weights   10/22/15 0847  Weight: 214 lb 11.7 oz (97.4 kg)    GENERAL: Well-nourished well-developed; Alert, no distress and comfortable.   Alone.  EYES: no pallor or icterus OROPHARYNX: no thrush or ulceration; good dentition  NECK: supple, no masses felt LYMPH:  no palpable lymphadenopathy in the cervical, axillary or inguinal regions LUNGS: clear to auscultation and  No wheeze or crackles HEART/CVS: regular rate & rhythm and no murmurs; No lower extremity edema ABDOMEN:abdomen soft, non-tender and normal bowel sounds Musculoskeletal:no cyanosis of digits and no clubbing  PSYCH: alert & oriented x 3 with fluent speech NEURO: no focal motor/sensory deficits SKIN:  no rashes or significant lesions; patient has Ommaya port.  LABORATORY DATA:  I have reviewed the data as listed    Component Value Date/Time   NA 140 06/22/2015 0935   NA 138 09/03/2013 0809   K 4.0 06/22/2015 0935   K 4.3 11/02/2014 0932   CL 106 06/22/2015 0935   CL 105 09/03/2013 0809   CO2 28 06/22/2015 0935   CO2 28 09/03/2013 0809   GLUCOSE 90 06/22/2015 0935   GLUCOSE 100* 09/03/2013 0809   BUN 24* 06/22/2015 0935   BUN 17 09/03/2013 0809   CREATININE 0.83 07/22/2015 0825   CREATININE 0.96 11/02/2014 0929   CALCIUM 8.9  06/22/2015 0935   CALCIUM 9.1 09/03/2013 0809   PROT 6.9 07/22/2015 0825   PROT 6.6 11/02/2014 0929   ALBUMIN 3.8 07/22/2015 0825   ALBUMIN 3.3* 11/02/2014 0929   AST 20 07/22/2015 0825   AST 16 11/02/2014 0929   ALT 16 07/22/2015 0825   ALT 19 11/02/2014 0929   ALKPHOS 78 07/22/2015 0825   ALKPHOS 87 11/02/2014 0929   BILITOT 0.7 07/22/2015 0825   BILITOT 0.3 11/02/2014 0929   GFRNONAA >60 07/22/2015 0825   GFRNONAA >60 11/02/2014 0929   GFRNONAA >60 07/27/2014 0836   GFRAA >60 07/22/2015 0825   GFRAA >60 11/02/2014 0929   GFRAA >60 07/27/2014 0836    No results found for: SPEP, UPEP  Lab Results  Component Value Date   WBC 3.9 10/22/2015   NEUTROABS 2.4 10/22/2015   HGB 14.0 10/22/2015   HCT 41.7 10/22/2015   MCV 89.7 10/22/2015   PLT 188 10/22/2015      Chemistry      Component Value Date/Time   NA 140 06/22/2015 0935   NA 138 09/03/2013 0809   K 4.0 06/22/2015 0935   K 4.3 11/02/2014 0932   CL 106 06/22/2015 0935   CL 105 09/03/2013 0809   CO2 28 06/22/2015 0935   CO2 28 09/03/2013 0809   BUN 24* 06/22/2015 0935   BUN 17 09/03/2013 0809   CREATININE 0.83 07/22/2015 0825   CREATININE 0.96 11/02/2014 0929      Component Value Date/Time   CALCIUM 8.9 06/22/2015 0935   CALCIUM 9.1 09/03/2013 0809   ALKPHOS 78 07/22/2015 0825   ALKPHOS 87 11/02/2014 0929   AST 20 07/22/2015 0825   AST 16 11/02/2014 0929   ALT 16 07/22/2015 0825   ALT 19 11/02/2014 0929   BILITOT 0.7 07/22/2015 0825   BILITOT 0.3 11/02/2014 0929       RADIOGRAPHIC STUDIES: I have personally reviewed the radiological images as listed and agreed with the findings in the report. No results found.   ASSESSMENT & PLAN:   # DLBCL- Stage IV status post autologous symptoms of transplant in 2012. Clinically no evidence of recurrence.  # paget's disease of Vulva- status post recent resection in October 2016. Clear margins. Follow-up with GYN oncology.  # Hypokalemia/hypomagnesemia-  reviewed the most recent labs within normal limits. Recommend coming off supplements.  # History of shingles- which reoccurred 2 years after her transplant/stopping Valtrex. She is currently on indifferent Valtrex. I would continue Valtrex for now.  All questions were answered. The patient knows to call the clinic with any problems, questions or concerns. No barriers to learning was detected.  I spent 25 minutes counseling the patient face to face. The total time spent in the appointment was 30 minutes and more than 50% was on counseling and review of test results     Cammie Sickle, MD 10/22/2015 8:50 AM

## 2015-10-27 ENCOUNTER — Ambulatory Visit: Payer: BLUE CROSS/BLUE SHIELD

## 2015-11-10 ENCOUNTER — Encounter: Payer: Self-pay | Admitting: *Deleted

## 2015-11-10 ENCOUNTER — Inpatient Hospital Stay: Payer: BLUE CROSS/BLUE SHIELD | Attending: Obstetrics and Gynecology | Admitting: Obstetrics and Gynecology

## 2015-11-10 ENCOUNTER — Encounter: Payer: Self-pay | Admitting: Obstetrics and Gynecology

## 2015-11-10 VITALS — BP 148/76 | HR 64 | Temp 98.0°F | Wt 219.2 lb

## 2015-11-10 DIAGNOSIS — C4499 Other specified malignant neoplasm of skin, unspecified: Secondary | ICD-10-CM

## 2015-11-10 NOTE — Progress Notes (Signed)
Assisted MD with pelvic exam

## 2015-11-10 NOTE — Progress Notes (Signed)
Gynecologic Oncology Visit   Referring Provider: Dr. Kenton Kingfisher.  Chief Concern: Recurrent paget's disease of the vulva, postoperative visit  Subjective:  Bailey Brooks is a 64 y.o. female who is seen in consultation from Dr. Kenton Kingfisher for paget's disease of the vulva. She presents for her postoperative visit after undergoing EUA, WLE, and vulvar biopsy on 09/29/2015.   She has done well since her surgery and has not complaints.   DIAGNOSIS:  A. VULVA; EXCISION:  - RARE CYTOKERATIN 7 POSITIVE INTRAEPIDERMAL CELLS, INTERPRETED AS  RESIDUAL PAGET DISEASE.  - THE SURGICAL MARGINS ARE CLEAR.   B. VULVA, 11:00; BIOPSY:  - RARE CYTOKERATIN 7 POSITIVE INTRAEPIDERMAL CELLS, INTERPRETED AS PAGET  DISEASE.   Prior to surgery she received 5 weekly doses of oral diflucan for vulvar candidiasis.    Gynecologic Oncology History: Bailey Brooks has a history of localized vulvar Paget's disease.   04/2013    vulvar biopsy revealed Paget's disease               WLE, additional margins resected for positive disease on Frozen evaluation.   Part A: VULVA, VAGINAL MARGIN:  - POSITIVE FOR EXTRAMAMMARY PAGET'S DISEASE.  Marland Kitchen  Part B: VULVA, POSTERIOR MARGIN:  - POSITIVE FOR EXTRAMAMMARY PAGET'S DISEASE.  Marland Kitchen  Part C: VULVA, RIGHT, PARTIAL VULVECTOMY:  - EXTRAMAMMARY PAGET'S DISEASE.  Marland Kitchen  Part D: VULVA, NEW VAGINAL MARGIN:  - POSITIVE FOR EXTRAMAMMARY PAGET'S DISEASE.   07/28/2015 for routine surveillance an area on the right vulva seemed more suspicious for recurrence. This was biopsied and confirmed recurrent Paget's disease.   Problem List: Patient Active Problem List   Diagnosis Date Noted  . Paget's disease of vulva   . Injury of tendon of upper extremity 06/24/2015  . Complete rotator cuff rupture of left shoulder 06/21/2015  . Adjustment disorder with mixed anxiety and depressed mood 05/25/2015  . Knee pain, right 05/25/2015  . Duodenitis 05/21/2014  . Esophagitis 05/21/2014  . Bergmann's  syndrome 05/21/2014  . 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 Routine general medical examination at a health care facility 02/27/2013  . Eczema 02/27/2013  . Muscle cramps 11/13/2012  . Screening for breast cancer 11/13/2012  . Personal history of lymphoma 02/20/2012  . Hypothyroidism 02/20/2012  . Obesity 02/20/2012  . Hyperlipidemia 02/20/2012    Past Medical History: Past Medical History  Diagnosis Date  . 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 Madelynn Done s/p RCHOP and methotrexate, c/b renal failure  . Hypothyroidism   . Paget disease, extra mammary     vulva, s/p resection 2014, Dr. Sabra Heck  . Allergic rhinitis   . CAD (coronary artery disease)   . Arthritis   . Peripheral neuropathy ([place])   . Sleep apnea   . 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 H/O stem cell transplant [facility]) Jul 2012  . Large cell lymphoma ([place])   . History of chemotherapy 22-Oct-2010    RHCOP/methotrexate-intrathecal  . Hypertension   . Hypercholesterolemia   . Esophagitis   . Hemorrhoids   . Diverticulosis     Past Surgical History: Past Surgical History  Procedure Laterality Date  . Tonsillectomy  1954  . Limbal stem cell transplant  2012  . Liver biopsy      stage 4B large Bcell lymphoma  . Burr hole w/ placement ommaya reservoir    . Breast biopsy  2002  Neg - AT [facility]  . Abdominal hysterectomy  1985    Hysterectomy-partial  . Vulvectomy    . Shoulder arthroscopy Right 06/22/2015    Procedure: ARTHROSCOPY SHOULDER, parital repair of rotator cuff, biceps tenodesis, decompression and debridement;  Surgeon: Corky Mull, MD;  Location: [facility] ORS;  Service: Orthopedics;  Laterality: Right;  . Insertion central venous access device w/ subcutaneous port  2011    Port a Cath: Right chest Double Lumen, 04-Nov-2010  . Vulvectomy partial N/A 09/29/2015    Procedure: VULVECTOMY PARTIAL;  Surgeon: Gillis Ends, MD;  Location: [facility] ORS;  Service: Gynecology;  Laterality: N/A;  . Colonoscopy  04/2013  . Esophagogastroduodenoscopy  04/2013  . Trudee Kuster hole w/ placement ommaya reservoir  2012    Past Gynecologic History:  S/p hysterectomy  OB History:  OB History  No data available    Family History: Family History  Problem Relation Age of Onset  . Diabetes Mother   . Hypertension Mother   . Melanoma Father 86    Melanoma  . Kidney disease Sister     Kidney removed   . Prostate cancer Brother     Prostate - dx in 33's  . Hearing loss Maternal Aunt   . Hearing loss Maternal Grandfather   . Heart disease Brother 71    MI & DVT in heart    Social History: Social History   Social History  . Marital Status: Married    Spouse Name: N/A  . Number of Children: 2  . Years of Education: N/A   Occupational History  . 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 Smoking status: Never Smoker   . Smokeless tobacco: Never Used  . Alcohol Use: No     Comment: former user-no current use  . Drug Use: No  . Sexual Activity:    Partners: Male   Other Topics Concern  . Not on file   Social History Narrative   Lives in [place] with husband, has 2 children boys.  No pets. 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    Allergies: Allergies  Allergen Reactions  . Erythromycin Nausea And Vomiting  . Iodine Swelling    IV iodine  . Morphine And Related Nausea And Vomiting    Hallucinations   . Sulfa Antibiotics Other (See Comments)    Dizzy/Fainting  . Adhesive [Tape] Other (See Comments) and Rash    Including band-aide Including band-aide    Current Medications: Current Outpatient Prescriptions  Medication Sig Dispense Refill  . Cholecalciferol (VITAMIN D) 2000 UNITS tablet Take 2,000 Units by mouth 2 (two) times daily.    . lansoprazole (PREVACID) 30 MG capsule take 1 capsule by mouth once daily 30 capsule 5  . levothyroxine (SYNTHROID,  LEVOTHROID) 75 MCG tablet take 1 tablet by mouth once daily BEFORE BREAKFAST 30 tablet 6  . magnesium chloride (SLOW-MAG) 64 MG TBEC SR tablet Take 1 tablet by mouth 2 (two) times daily.    . potassium chloride (K-DUR) 10 MEQ tablet take 1 tablet by mouth twice a day 180 tablet 1  . valACYclovir (VALTREX) 500 MG tablet Take 1 tablet (500 mg total) by mouth 2 (two) times daily. 60 tablet 3   No current facility-administered medications for this visit.     Objective:  Physical Examination:  BP 148/76 mmHg  Pulse 64  Temp(Src) 98 F (36.7 C) (Tympanic)  Wt 219 lb 4 oz (99.45 kg)   Body mass  index is 37.62 kg/(m^2).    ECOG Performance Status: 1 - Symptomatic but completely ambulatory  General appearance: alert, cooperative and appears stated age HEENT:PERRLA, extra ocular movement intact and sclera clear, anicteric Neurological exam reveals alert, oriented, normal speech, no focal findings  Pelvic: exam chaperoned by nurse;  Vulva: vulvar scars after resection, well healed incision that is intact without evidence of infection. No gross lesions. Diffuse vulvar erythema with extension to medial thighs bilaterally.   Lab Review Labs on site today: none  Radiologic Imaging: n/a    Assessment:  Bailey Brooks is a 64 y.o. female diagnosed with recurrent Paget's disease of the vulva s/p WLE with positive margins with subsequent recurrence diagnosed 07/2015. Repeat WLE c/w recurrent Paget's disease of the vulva s/p WLE with negative margins and biopsy with rare CK7 + intradermal cells interpreted as Paget's disease, clinically asymptomatic. Recurrent vulvar candidiasis.    Plan:   Problem List Items Addressed This Visit      Musculoskeletal and Integument   Paget disease, extra mammary - Primary (Chronic)    Other Visit Diagnoses    Vulvar candidiasis       I recommended continued close surveillance for Paget's disease with repeat exam and visit in 6 months. While the biopsy  was performed and revealed rare Paget cells I do not think further resection is needed at this time as no gross visible lesion is present.   With regard to the vulvar candidiasis she received prolonged treatment with oral diflucan with evidence of recurrence. Per UpToDate the following approaches listed below are acceptable. I have recommended the first option with topical therapy and provided the patient with options. She will contact us if she has increased symptoms or concerns. At this point she is asymptomatic, but given the extent involved I have recommended treatment.   ?Treat each recurrent episode as an episode of uncomplicated infection (table 3) [1] ?Treat each recurrent episode with longer duration of therapy (eg, topical azole for 7 to 14 days or fluconazole 150 mg orally on day 1, day 4, and day 7) [1] ?The Infectious Diseases Society of [place] (IDSA) recommends 10 to 14 days of induction therapy with a topical or oral azole, followed by fluconazole 150 mg once per week for six months (clotrimazole 200 mg vaginal cream twice weekly is a nonoral alternative) [75].     Gillis Ends, MD    CC:  Dr. Kenton Kingfisher.

## 2015-11-10 NOTE — Patient Instructions (Signed)
SURGICAL PATHOLOGY Surgical Pathology  CASE: [id number]  PATIENT: Bailey Brooks  Surgical Pathology Report      SPECIMEN SUBMITTED:  A. Vulva excision, stitch at 12:00  B. Vulva biopsy at 11:00   CLINICAL HISTORY:  None provided   PRE-OPERATIVE DIAGNOSIS:  Paget's disease   POST-OPERATIVE DIAGNOSIS:  None provided      DIAGNOSIS:  A. VULVA; EXCISION:  - RARE CYTOKERATIN 7 POSITIVE INTRAEPIDERMAL CELLS, INTERPRETED AS  RESIDUAL PAGET DISEASE.  - THE SURGICAL MARGINS ARE CLEAR.   B. VULVA, 11:00; BIOPSY:  - RARE CYTOKERATIN 7 POSITIVE INTRAEPIDERMAL CELLS, INTERPRETED AS PAGET  DISEASE.   Note:  The control slides stained appropriately.    GROSS DESCRIPTION:  A. Labeled: Vulvar excision, stitch at 12:00   Tissue fragment(s): 1   Size: 2.3 x 1.3 x 0.8 cm   Description: Roughly elliptical-shaped piece of pink red skin and soft  tissue with a length of suture tied to specimen designating the 12:00  position. There are surgical ink marks noted at the approximate 12:00  and 6:00 positions. The skin surface is somewhat irregular and red and  with a linear defect that measures approximately 0.7 cm in length.   Entirely submitted and designated:  1-12:00 tip  2-6:00 tip  3-5-Central portion of skin taken from 12:00 towards 6:00 edge   B. Labeled: Vulvar biopsy at 11:00   Tissue fragment(s): 1   Size: 0.7 x 0.3 x 0.2 cm   Description: Shave piece of pale pink skin, inked   Entirely submitted in one cassette(s).      Final Diagnosis performed by Delorse Lek, MD. Electronically signed  10/05/2015 2:16:59PM     The electronic signature indicates that the named Attending Pathologist  has evaluated the specimen   Technical component performed at [facility], [address] [place],  [place] [address]  Lab: [phone] Dir: Darrick Penna. Evette Doffing, MD   Professional component performed at [facility], [facility],  [place],  [place], [place] [address]  Lab: [phone] Dir: Dellia Nims. Rubinas, MD              Cutaneous Candidiasis Cutaneous candidiasis is a condition in which there is an overgrowth of yeast (candida) on the skin. Yeast normally live on the skin, but in small enough numbers not to cause any symptoms. In certain cases, increased growth of the yeast may cause an actual yeast infection. This kind of infection usually occurs in areas of the skin that are constantly warm and moist, such as the armpits or the groin. Yeast is the most common cause of diaper rash in babies and in people who cannot control their bowel movements (incontinence). CAUSES  The fungus that most often causes cutaneous candidiasis is Candida albicans. Conditions that can increase the risk of getting a yeast infection of the skin include:  Obesity.  Pregnancy.  Diabetes.  Taking antibiotic medicine.  Taking birth control pills.  Taking steroid medicines.  Thyroid disease.  An iron or zinc deficiency.  Problems with the immune system. SYMPTOMS   Red, swollen area of the skin.  Bumps on the skin.  Itchiness. DIAGNOSIS  The diagnosis of cutaneous candidiasis is usually based on its appearance. Light scrapings of the skin may also be taken and viewed under a microscope to identify the presence of yeast. TREATMENT  Antifungal creams may be applied to the infected skin. In severe cases, oral medicines may be needed.  HOME CARE INSTRUCTIONS   Keep your skin clean and  dry.  Maintain a healthy weight.  If you have diabetes, keep your blood sugar under control. SEEK IMMEDIATE MEDICAL CARE IF:  Your rash continues to spread despite treatment.  You have a fever, chills, or abdominal pain.   This information is not intended to replace advice given to you by your health care provider. Make sure you discuss any questions you have with your health care provider.   Document Released: 08/08/2011 Document Revised: 02/12/2012  Document Reviewed: 05/24/2015 Elsevier Interactive Patient Education [employer].

## 2015-11-23 ENCOUNTER — Other Ambulatory Visit: Payer: Self-pay | Admitting: Internal Medicine

## 2015-12-31 ENCOUNTER — Other Ambulatory Visit: Payer: Self-pay | Admitting: Internal Medicine

## 2015-12-31 ENCOUNTER — Other Ambulatory Visit: Payer: Self-pay | Admitting: Family Medicine

## 2016-02-29 IMAGING — MR MR SHOULDER*R* W/O CM
5 series · 40 of 40 positions shown · non-contrast
Comparison: 05/27/2015

CLINICAL DATA: Right shoulder pain and weakness.  Recent fall.

EXAM:
MRI OF THE RIGHT SHOULDER WITHOUT CONTRAST
TECHNIQUE: Multiplanar, multisequence MR imaging of the shoulder was performed.
No intravenous contrast was administered.

[Series 4: T2 fat-sat · oblique · 4.0mm · 0.62mm/px · 7 of 19 slices shown (1 of 3)]
[im 1/19]
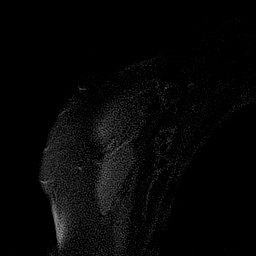
[im 4/19]
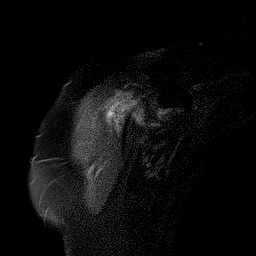
[im 7/19]
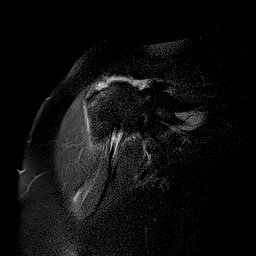
[im 10/19]
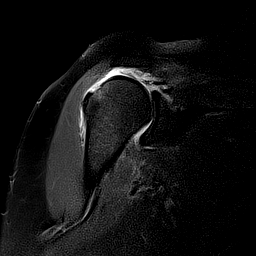
[im 13/19]
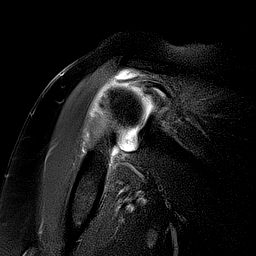
[im 16/19]
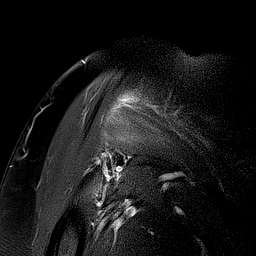
[im 19/19]
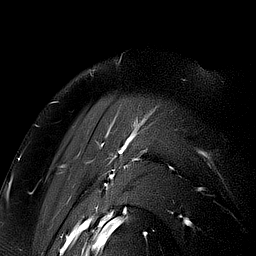

[Series 5: PD · oblique · 4.0mm · 0.62mm/px · 7 of 19 slices shown]
[im 1/19]
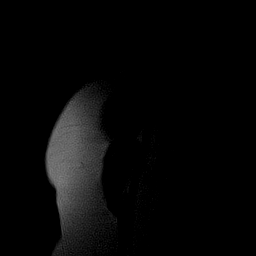
[im 4/19]
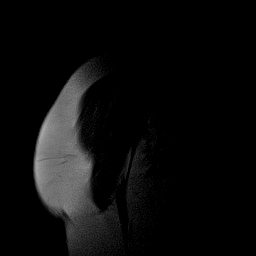
[im 7/19]
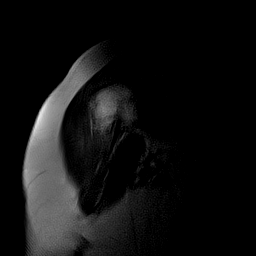
[im 10/19]
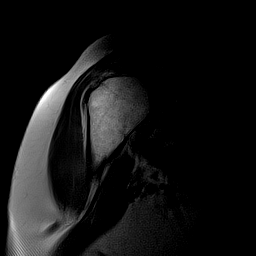
[im 13/19]
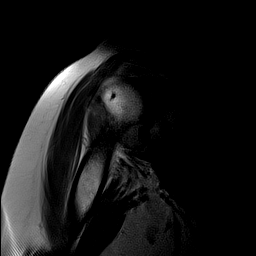
[im 16/19]
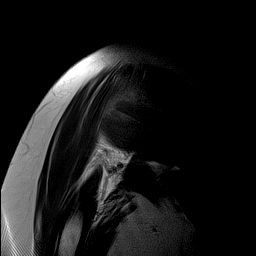
[im 19/19]
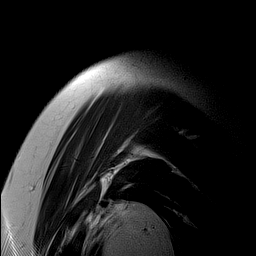

[Series 6: T1 · oblique · 4.0mm · 0.55mm/px · 8 of 21 slices shown]
[im 1/21]
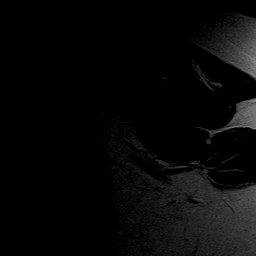
[im 3/21]
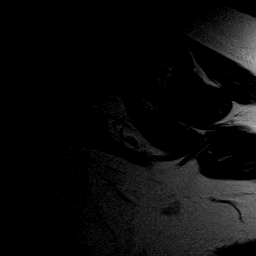
[im 6/21]
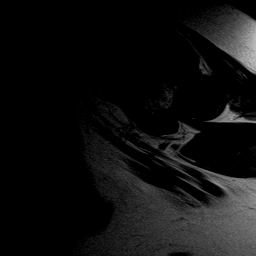
[im 9/21]
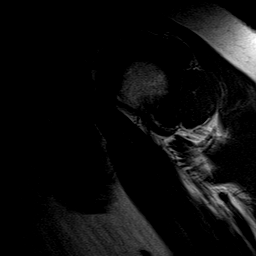
[im 12/21]
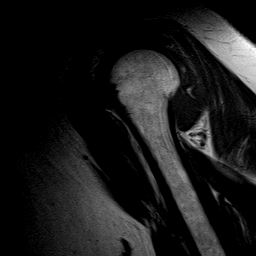
[im 15/21]
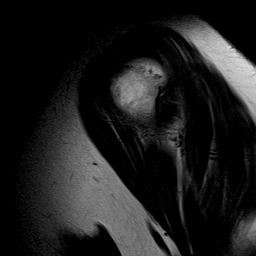
[im 18/21]
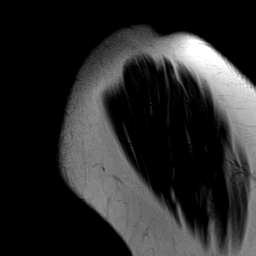
[im 21/21]
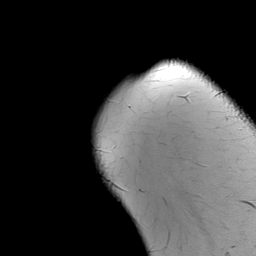

[Series 7: T2 fat-sat · oblique · 4.0mm · 0.55mm/px · 8 of 21 slices shown (2 of 3)]
[im 1/21]
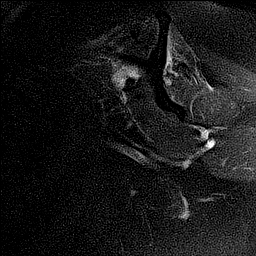
[im 3/21]
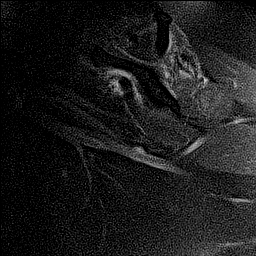
[im 6/21]
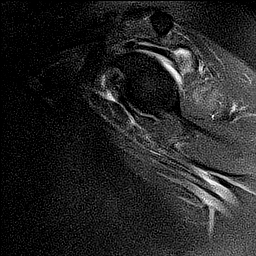
[im 9/21]
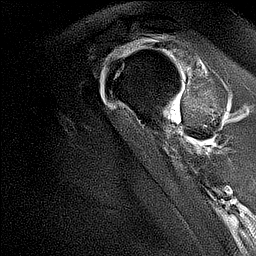
[im 12/21]
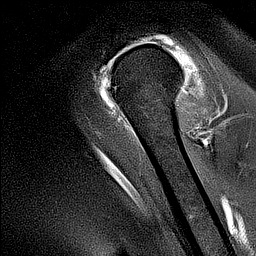
[im 15/21]
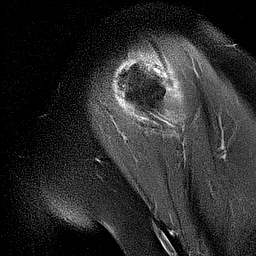
[im 18/21]
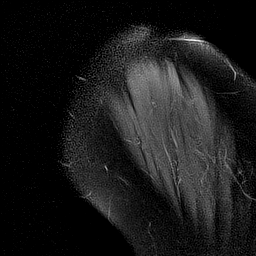
[im 21/21]
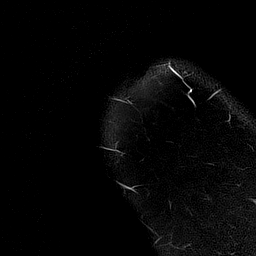

[Series 8: T2 fat-sat · axial · 4.0mm · 0.47mm/px · z∈[-20,+86]mm · 10 of 25 slices shown (3 of 3)]
[im 1/25]
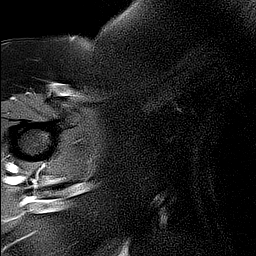
[im 3/25]
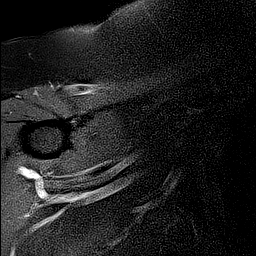
[im 6/25]
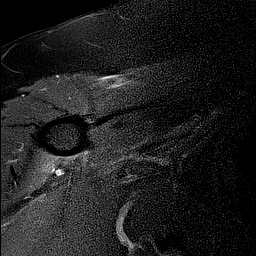
[im 9/25]
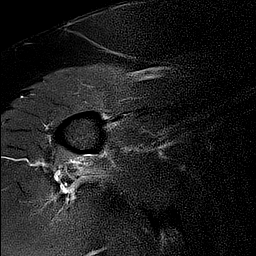
[im 11/25]
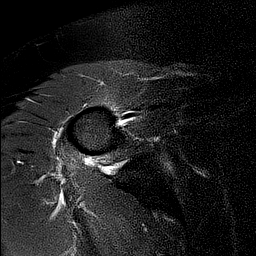
[im 14/25]
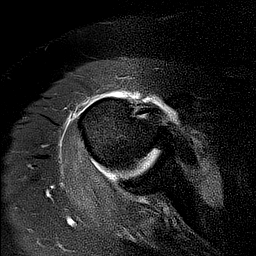
[im 17/25]
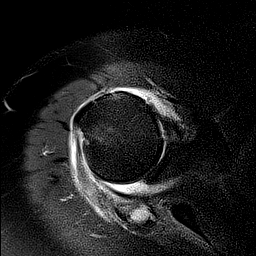
[im 19/25]
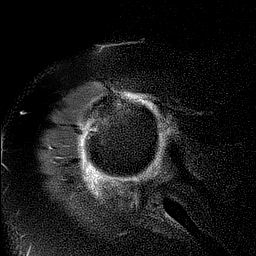
[im 22/25]
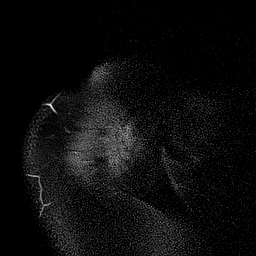
[im 25/25]
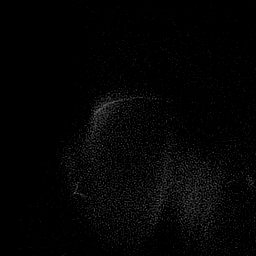

[40 of 40 positions shown; findings below may reference images not displayed]

FINDINGS: Rotator cuff: There is a large full-thickness retracted tear of the
supraspinous and infraspinatus tendons. The defect is approximately
4 x 5 cm. The tendons are retracted with a prominent intrasubstance
tear at the musculotendinous junction of the infraspinatus. Teres
minor tendon is intact. Shallow partial-thickness articular surface
tear of the distal subscapularis tendon.

Muscles: Slight atrophy of the supraspinous and infraspinatus
muscles.

Biceps long head:  Properly located and intact.

Acromioclavicular Joint: Hypertrophy of the acromioclavicular joint
with a prominent anterior osteophyte on the acromion with a type 3
configuration which could predispose to impingement.

Glenohumeral Joint: Prominent glenohumeral joint effusion.

Labrum:  Intact.

Bones:  Normal.
IMPRESSION: 1. Large full-thickness retracted tears of the infraspinatus and
supraspinous tendons with slight atrophy of those muscles.
Partial-thickness articular surface tear of the distal subscapularis
tendon.
2. Prominent glenohumeral joint effusion.
3. Prominent anterior osteophyte on the type 3 acromion which could
predispose to impingement.

## 2016-03-02 ENCOUNTER — Ambulatory Visit (INDEPENDENT_AMBULATORY_CARE_PROVIDER_SITE_OTHER): Payer: BLUE CROSS/BLUE SHIELD | Admitting: Internal Medicine

## 2016-03-02 ENCOUNTER — Ambulatory Visit
Admission: RE | Admit: 2016-03-02 | Discharge: 2016-03-02 | Disposition: A | Discharge status: Home / Self Care | Payer: BLUE CROSS/BLUE SHIELD | Source: Ambulatory Visit | Attending: Internal Medicine | Admitting: Internal Medicine

## 2016-03-02 ENCOUNTER — Encounter: Payer: Self-pay | Admitting: Internal Medicine

## 2016-03-02 VITALS — BP 158/79 | HR 75 | Temp 98.2°F | Wt 232.0 lb

## 2016-03-02 DIAGNOSIS — M25551 Pain in right hip: Secondary | ICD-10-CM

## 2016-03-02 DIAGNOSIS — M47896 Other spondylosis, lumbar region: Secondary | ICD-10-CM | POA: Diagnosis not present

## 2016-03-02 DIAGNOSIS — E039 Hypothyroidism, unspecified: Secondary | ICD-10-CM | POA: Diagnosis not present

## 2016-03-02 DIAGNOSIS — I739 Peripheral vascular disease, unspecified: Secondary | ICD-10-CM | POA: Insufficient documentation

## 2016-03-02 DIAGNOSIS — B3731 Acute candidiasis of vulva and vagina: Secondary | ICD-10-CM | POA: Insufficient documentation

## 2016-03-02 DIAGNOSIS — B373 Candidiasis of vulva and vagina: Secondary | ICD-10-CM

## 2016-03-02 HISTORY — DX: Hypomagnesemia: E83.42

## 2016-03-02 LAB — COMPREHENSIVE METABOLIC PANEL
ALT: 13 U/L (ref 0–35)
AST: 15 U/L (ref 0–37)
Albumin: 3.7 g/dL (ref 3.5–5.2)
Alkaline Phosphatase: 78 U/L (ref 39–117)
BUN: 21 mg/dL (ref 6–23)
CALCIUM: 9.6 mg/dL (ref 8.4–10.5)
CHLORIDE: 104 meq/L (ref 96–112)
CO2: 30 meq/L (ref 19–32)
Creatinine, Ser: 0.84 mg/dL (ref 0.40–1.20)
GFR: 72.08 mL/min (ref >60.00)
GLUCOSE: 86 mg/dL (ref 70–99)
Potassium: 4 mEq/L (ref 3.5–5.1)
Sodium: 140 mEq/L (ref 135–145)
Total Bilirubin: 0.4 mg/dL (ref 0.2–1.2)
Total Protein: 6.8 g/dL (ref 6.0–8.3)

## 2016-03-02 LAB — LIPID PANEL
Cholesterol: 247 mg/dL — ABNORMAL HIGH (ref 0–200)
HDL: 44.8 mg/dL (ref >39.00)
LDL Cholesterol: 175 mg/dL — ABNORMAL HIGH (ref 0–99)
NonHDL: 202.33
TRIGLYCERIDES: 139 mg/dL (ref 0.0–149.0)
Total CHOL/HDL Ratio: 6
VLDL: 27.8 mg/dL (ref 0.0–40.0)

## 2016-03-02 LAB — TSH: TSH: 5.29 u[IU]/mL — AB (ref 0.35–4.50)

## 2016-03-02 LAB — MAGNESIUM: Magnesium: 2 mg/dL (ref 1.5–2.5)

## 2016-03-02 LAB — HEMOGLOBIN A1C: HEMOGLOBIN A1C: 5.6 % (ref 4.6–6.5)

## 2016-03-02 MED ORDER — FLUCONAZOLE 150 MG PO TABS: 150.0000 mg | ORAL_TABLET | ORAL | Status: DC

## 2016-03-02 MED ORDER — NYSTATIN 100000 UNIT/GM EX POWD: CUTANEOUS | Status: DC

## 2016-03-02 NOTE — Assessment & Plan Note (Signed)
Recheck Mag level with labs.

## 2016-03-02 NOTE — Progress Notes (Signed)
Subjective:    Patient ID: Bailey Brooks, female    DOB: 12-06-1950, 65 y.o.   MRN: [id number]  HPI  65YO female presents for acute visit.  Vaginitis - Prolonged vaginal yeast infection. Treated multiple times last year. Persistent symptoms including rash which extends under folds of skin. Started using some topical Nystatin with some improvement. No vaginal itching, but lacks sensation in this area.   Multiple skin lesions over back. Planning to see dermatology in next few weeks.  Right hip pain - Ongoing for months. Aching pain. Worse with standing or walking. Improved with rest. Not taking anything for this. No known injury.   Wt Readings from Last 3 Encounters:  03/02/16 232 lb (105.235 kg)  11/10/15 219 lb 4 oz (99.45 kg)  10/22/15 214 lb 11.7 oz (97.4 kg)   BP Readings from Last 3 Encounters:  03/02/16 158/79  11/10/15 148/76  10/22/15 135/84    Past Medical History  Diagnosis Date  . 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 Madelynn Done s/p RCHOP and methotrexate, c/b renal failure  . Hypothyroidism   . Paget disease, extra mammary     vulva, s/p resection 2014, Dr. Sabra Heck  . Allergic rhinitis   . CAD (coronary artery disease)   . Arthritis   . Peripheral neuropathy ([place])   . Sleep apnea   . 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 H/O stem cell transplant [facility]) Jul 2012  . Large cell lymphoma ([place])   . History of chemotherapy 22-Oct-2010    RHCOP/methotrexate-intrathecal  . Hypertension   . Hypercholesterolemia   . Esophagitis   . Hemorrhoids   . Diverticulosis    Family History  Problem Relation Age of Onset  . Diabetes Mother   . Hypertension Mother   . Melanoma Father 73    Melanoma  . Kidney disease Sister     Kidney removed   . Prostate cancer Brother     Prostate - dx in 47's  . Hearing loss Maternal Aunt   . Hearing loss Maternal Grandfather   . Heart disease  Brother 47    MI & DVT in heart   Past Surgical History  Procedure Laterality Date  . Tonsillectomy  1954  . Limbal stem cell transplant  2012  . Liver biopsy      stage 4B large Bcell lymphoma  . Burr hole w/ placement ommaya reservoir    . Breast biopsy  2002    Neg - AT [facility]  . Abdominal hysterectomy  1985    Hysterectomy-partial  . Vulvectomy    . Shoulder arthroscopy Right 06/22/2015    Procedure: ARTHROSCOPY SHOULDER, parital repair of rotator cuff, biceps tenodesis, decompression and debridement;  Surgeon: Corky Mull, MD;  Location: [facility] ORS;  Service: Orthopedics;  Laterality: Right;  . Insertion central venous access device w/ subcutaneous port  2011    Port a Cath: Right chest Double Lumen, 04-Nov-2010  . Vulvectomy partial N/A 09/29/2015    Procedure: VULVECTOMY PARTIAL;  Surgeon: Gillis Ends, MD;  Location: [facility] ORS;  Service: Gynecology;  Laterality: N/A;  . Colonoscopy  04/2013  . Esophagogastroduodenoscopy  04/2013  . Trudee Kuster hole w/ placement ommaya reservoir  2012   Social History   Social History  . Marital Status: Married    Spouse Name: N/A  . Number of Children: 2  . Years of Education: N/A  Occupational History  . 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 Smoking status: Never Smoker   . Smokeless tobacco: Never Used  . Alcohol Use: No     Comment: former user-no current use  . Drug Use: No  . Sexual Activity:    Partners: Male   Other Topics Concern  . None   Social History Narrative   Lives in [place] with husband, has 2 children boys.  No pets. 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    Review of Systems  Constitutional: Negative for fever, chills, appetite change, fatigue and unexpected weight change.  Eyes: Negative for visual disturbance.  Respiratory: Negative for shortness of breath.   Cardiovascular: Negative for chest pain and leg swelling.  Gastrointestinal: Negative for nausea, vomiting,  abdominal pain, diarrhea and constipation.  Genitourinary: Negative for dysuria, flank pain, vaginal bleeding, vaginal discharge, genital sores, vaginal pain and pelvic pain.  Musculoskeletal: Positive for myalgias and arthralgias. Negative for back pain, joint swelling and gait problem.  Skin: Positive for color change and rash.  Hematological: Negative for adenopathy. Does not bruise/bleed easily.  Psychiatric/Behavioral: Negative for sleep disturbance and dysphoric mood. The patient is not nervous/anxious.        Objective:    BP 158/79 mmHg  Pulse 75  Temp(Src) 98.2 F (36.8 C) (Oral)  Wt 232 lb (105.235 kg)  SpO2 95% Physical Exam  Constitutional: She is oriented to person, place, and time. She appears well-developed and well-nourished. No distress.  HENT:  Head: Normocephalic and atraumatic.  Right Ear: External ear normal.  Left Ear: External ear normal.  Nose: Nose normal.  Mouth/Throat: Oropharynx is clear and moist. No oropharyngeal exudate.  Eyes: Conjunctivae are normal. Pupils are equal, round, and reactive to light. Right eye exhibits no discharge. Left eye exhibits no discharge. No scleral icterus.  Neck: Normal range of motion. Neck supple. No tracheal deviation present. No thyromegaly present.  Cardiovascular: Normal rate, regular rhythm, normal heart sounds and intact distal pulses.  Exam reveals no gallop and no friction rub.   No murmur heard. Pulmonary/Chest: Effort normal and breath sounds normal. No respiratory distress. She has no wheezes. She has no rales. She exhibits no tenderness.  Genitourinary:    There is rash on the right labia. There is rash on the left labia.  Musculoskeletal: Normal range of motion. She exhibits no edema or tenderness.  Lymphadenopathy:    She has no cervical adenopathy.  Neurological: She is alert and oriented to person, place, and time. No cranial nerve deficit. She exhibits normal muscle tone. Coordination normal.  Skin:  Skin is warm and dry. No rash noted. She is not diaphoretic. No erythema. No pallor.  Psychiatric: She has a normal mood and affect. Her behavior is normal. Judgment and thought content normal.          Assessment & Plan:   Problem List Items Addressed This Visit      Unprioritized   Hypomagnesemia    Recheck Mag level with labs.      Relevant Orders   Magnesium   Hypothyroidism    Check TSH with labs.      Relevant Orders   TSH   Morbid obesity due to excess calories ([place])    Wt Readings from Last 3 Encounters:  03/02/16 232 lb (105.235 kg)  11/10/15 219 lb 4 oz (99.45 kg)  10/22/15 214 lb 11.7 oz (97.4 kg)   Body mass index is 39.8 kg/(m^2).  Morbid obesity. Will check A1c and TSH with labs today.      Relevant Orders   Comprehensive metabolic panel   Hemoglobin A1c   Lipid panel   Recurrent candidiasis of vagina - Primary    Recurrent vaginal candidiasis. Reviewed notes from Gyn-Onc. Will follow recommendations for topical therapy and 6 months of weekly Diflucan. Follow up recheck in 4 weeks and prn.      Relevant Medications   fluconazole (DIFLUCAN) 150 MG tablet   nystatin (MYCOSTATIN/NYSTOP) 100000 UNIT/GM POWD       Return in about 4 weeks (around 03/30/2016) for Recheck.  Ronette Deter, MD [facility]

## 2016-03-02 NOTE — Addendum Note (Signed)
Addended by: Ronette Deter A on: 03/02/2016 08:49 AM   Modules accepted: Orders

## 2016-03-02 NOTE — Assessment & Plan Note (Signed)
Recurrent vaginal candidiasis. Reviewed notes from Gyn-Onc. Will follow recommendations for topical therapy and 6 months of weekly Diflucan. Follow up recheck in 4 weeks and prn.

## 2016-03-02 NOTE — Progress Notes (Signed)
Pre visit review using our clinic review tool, if applicable. No additional management support is needed unless otherwise documented below in the visit note. 

## 2016-03-02 NOTE — Patient Instructions (Signed)
Start Diflucan 150mg  weekly for the next 6 months.  Use topical Nystatin powder in groin area.  Follow up if symptoms are not improving.  Labs today.

## 2016-03-02 NOTE — Assessment & Plan Note (Signed)
Wt Readings from Last 3 Encounters:  03/02/16 232 lb (105.235 kg)  11/10/15 219 lb 4 oz (99.45 kg)  10/22/15 214 lb 11.7 oz (97.4 kg)   Body mass index is 39.8 kg/(m^2). Morbid obesity. Will check A1c and TSH with labs today.

## 2016-03-02 NOTE — Assessment & Plan Note (Signed)
Check TSH with labs.

## 2016-03-03 ENCOUNTER — Encounter: Payer: Self-pay | Admitting: Internal Medicine

## 2016-03-03 ENCOUNTER — Encounter: Payer: Self-pay | Admitting: *Deleted

## 2016-03-03 ENCOUNTER — Other Ambulatory Visit: Payer: Self-pay | Admitting: *Deleted

## 2016-03-03 MED ORDER — LEVOTHYROXINE SODIUM 88 MCG PO TABS: 88.0000 ug | ORAL_TABLET | Freq: Every day | ORAL | Status: DC

## 2016-03-13 DIAGNOSIS — Z808 Family history of malignant neoplasm of other organs or systems: Secondary | ICD-10-CM | POA: Diagnosis not present

## 2016-03-13 DIAGNOSIS — D1800 Hemangioma unspecified site: Secondary | ICD-10-CM | POA: Diagnosis not present

## 2016-03-13 DIAGNOSIS — L308 Other specified dermatitis: Secondary | ICD-10-CM | POA: Diagnosis not present

## 2016-03-13 DIAGNOSIS — Z1283 Encounter for screening for malignant neoplasm of skin: Secondary | ICD-10-CM | POA: Diagnosis not present

## 2016-03-25 DIAGNOSIS — Z1231 Encounter for screening mammogram for malignant neoplasm of breast: Secondary | ICD-10-CM | POA: Diagnosis not present

## 2016-04-05 ENCOUNTER — Encounter: Payer: Self-pay | Admitting: Internal Medicine

## 2016-04-05 ENCOUNTER — Ambulatory Visit (INDEPENDENT_AMBULATORY_CARE_PROVIDER_SITE_OTHER): Payer: BLUE CROSS/BLUE SHIELD | Admitting: Internal Medicine

## 2016-04-05 VITALS — BP 144/88 | HR 64 | Ht 64.0 in | Wt 233.0 lb

## 2016-04-05 DIAGNOSIS — B3731 Acute candidiasis of vulva and vagina: Secondary | ICD-10-CM

## 2016-04-05 DIAGNOSIS — E785 Hyperlipidemia, unspecified: Secondary | ICD-10-CM | POA: Diagnosis not present

## 2016-04-05 DIAGNOSIS — E039 Hypothyroidism, unspecified: Secondary | ICD-10-CM

## 2016-04-05 DIAGNOSIS — B373 Candidiasis of vulva and vagina: Secondary | ICD-10-CM | POA: Diagnosis not present

## 2016-04-05 LAB — TSH: TSH: 3.01 u[IU]/mL (ref 0.35–4.50)

## 2016-04-05 NOTE — Assessment & Plan Note (Signed)
Wt Readings from Last 3 Encounters:  04/05/16 233 lb (105.688 kg)  03/02/16 232 lb (105.235 kg)  11/10/15 219 lb 4 oz (99.45 kg)   Encouraged healthy diet and exercise.

## 2016-04-05 NOTE — Patient Instructions (Signed)

## 2016-04-05 NOTE — Assessment & Plan Note (Signed)
Repeat TSH with labs today. Continue Levothyroxine. 

## 2016-04-05 NOTE — Assessment & Plan Note (Signed)
Lipids elevated. Recommended statin. She declines. Encouraged Mediterranean style diet and exercise.

## 2016-04-05 NOTE — Progress Notes (Signed)
Subjective:    Patient ID: Bailey Brooks, female    DOB: August 24, 1951, 65 y.o.   MRN: [id number]  HPI  65YO female presents for follow up.  Recently seen for persistent vaginal candidiasis. Started on 6 month course of Diflucan.  Labs at previous visit remarkable for elevated cholesterol with LDL 175 and elevated TSH of 5.29.  Rash in groin has improved. Continues on Diflucan.  Reluctant to start statin medication because of concern about previous lymphoma. No family history of heart disease.  Started back exercising at [facility]. Walking or running and weight lifting. Hip pain has improved. Starting to use her standing desk.   Wt Readings from Last 3 Encounters:  04/05/16 233 lb (105.688 kg)  03/02/16 232 lb (105.235 kg)  11/10/15 219 lb 4 oz (99.45 kg)   BP Readings from Last 3 Encounters:  04/05/16 144/88  03/02/16 158/79  11/10/15 148/76    Past Medical History  Diagnosis Date  . 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 Madelynn Done s/p RCHOP and methotrexate, c/b renal failure  . Hypothyroidism   . Paget disease, extra mammary     vulva, s/p resection 2014, Dr. Sabra Heck  . Allergic rhinitis   . CAD (coronary artery disease)   . Arthritis   . Peripheral neuropathy ([place])   . Sleep apnea   . 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 H/O stem cell transplant [facility]) Jul 2012  . Large cell lymphoma ([place])   . History of chemotherapy 22-Oct-2010    RHCOP/methotrexate-intrathecal  . Hypertension   . Hypercholesterolemia   . Esophagitis   . Hemorrhoids   . Diverticulosis    Family History  Problem Relation Age of Onset  . Diabetes Mother   . Hypertension Mother   . Melanoma Father 36    Melanoma  . Kidney disease Sister     Kidney removed   . Prostate cancer Brother     Prostate - dx in 29's  . Hearing loss Maternal Aunt   . Hearing loss Maternal Grandfather   . Heart disease Brother 55     MI & DVT in heart   Past Surgical History  Procedure Laterality Date  . Tonsillectomy  1954  . Limbal stem cell transplant  2012  . Liver biopsy      stage 4B large Bcell lymphoma  . Burr hole w/ placement ommaya reservoir    . Breast biopsy  2002    Neg - AT [facility]  . Abdominal hysterectomy  1985    Hysterectomy-partial  . Vulvectomy    . Shoulder arthroscopy Right 06/22/2015    Procedure: ARTHROSCOPY SHOULDER, parital repair of rotator cuff, biceps tenodesis, decompression and debridement;  Surgeon: Corky Mull, MD;  Location: [facility] ORS;  Service: Orthopedics;  Laterality: Right;  . Insertion central venous access device w/ subcutaneous port  2011    Port a Cath: Right chest Double Lumen, 04-Nov-2010  . Vulvectomy partial N/A 09/29/2015    Procedure: VULVECTOMY PARTIAL;  Surgeon: Gillis Ends, MD;  Location: [facility] ORS;  Service: Gynecology;  Laterality: N/A;  . Colonoscopy  04/2013  . Esophagogastroduodenoscopy  04/2013  . Trudee Kuster hole w/ placement ommaya reservoir  2012   Social History   Social History  . Marital Status: Married    Spouse Name: N/A  . Number of Children: 2  . Years of Education: N/A  Occupational History  . 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 Smoking status: Never Smoker   . Smokeless tobacco: Never Used  . Alcohol Use: No     Comment: former user-no current use  . Drug Use: No  . Sexual Activity:    Partners: Male   Other Topics Concern  . None   Social History Narrative   Lives in [place] with husband, has 2 children boys.  No pets. 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    Review of Systems  Constitutional: Negative for fever, chills, appetite change, fatigue and unexpected weight change.  Eyes: Negative for visual disturbance.  Respiratory: Negative for cough and shortness of breath.   Cardiovascular: Negative for chest pain and leg swelling.  Gastrointestinal: Negative for abdominal pain, diarrhea  and constipation.  Musculoskeletal: Negative for myalgias and arthralgias.  Skin: Negative for color change and rash.  Hematological: Negative for adenopathy. Does not bruise/bleed easily.  Psychiatric/Behavioral: Negative for sleep disturbance and dysphoric mood. The patient is not nervous/anxious.        Objective:    BP 144/88 mmHg  Pulse 64  Ht 5\' 4"  (1.626 m)  Wt 233 lb (105.688 kg)  BMI 39.97 kg/m2  SpO2 95% Physical Exam  Constitutional: She is oriented to person, place, and time. She appears well-developed and well-nourished. No distress.  HENT:  Head: Normocephalic and atraumatic.  Right Ear: External ear normal.  Left Ear: External ear normal.  Nose: Nose normal.  Mouth/Throat: Oropharynx is clear and moist. No oropharyngeal exudate.  Eyes: Conjunctivae are normal. Pupils are equal, round, and reactive to light. Right eye exhibits no discharge. Left eye exhibits no discharge. No scleral icterus.  Neck: Normal range of motion. Neck supple. No tracheal deviation present. No thyromegaly present.  Cardiovascular: Normal rate, regular rhythm, normal heart sounds and intact distal pulses.  Exam reveals no gallop and no friction rub.   No murmur heard. Pulmonary/Chest: Effort normal and breath sounds normal. No respiratory distress. She has no wheezes. She has no rales. She exhibits no tenderness.  Musculoskeletal: Normal range of motion. She exhibits no edema or tenderness.  Lymphadenopathy:    She has no cervical adenopathy.  Neurological: She is alert and oriented to person, place, and time. No cranial nerve deficit. She exhibits normal muscle tone. Coordination normal.  Skin: Skin is warm and dry. No rash noted. She is not diaphoretic. No erythema. No pallor.  Psychiatric: She has a normal mood and affect. Her behavior is normal. Judgment and thought content normal.          Assessment & Plan:   Problem List Items Addressed This Visit      Unprioritized    Hyperlipidemia    Lipids elevated. Recommended statin. She declines. Encouraged Mediterranean style diet and exercise.      Hypothyroidism - Primary    Repeat TSH with labs today. Continue Levothyroxine.      Relevant Orders   TSH   Morbid obesity due to excess calories ([place])    Wt Readings from Last 3 Encounters:  04/05/16 233 lb (105.688 kg)  03/02/16 232 lb (105.235 kg)  11/10/15 219 lb 4 oz (99.45 kg)   Encouraged healthy diet and exercise.      Recurrent candidiasis of vagina    Symptoms improving. Will continue Diflucan.          Return in about 3 months (around 07/06/2016) for Recheck.  Ronette Deter, MD Internal Medicine  [facility]

## 2016-04-05 NOTE — Assessment & Plan Note (Signed)
Symptoms improving. Will continue Diflucan.

## 2016-04-05 NOTE — Progress Notes (Signed)
Pre visit review using our clinic review tool, if applicable. No additional management support is needed unless otherwise documented below in the visit note. 

## 2016-04-21 ENCOUNTER — Inpatient Hospital Stay (HOSPITAL_BASED_OUTPATIENT_CLINIC_OR_DEPARTMENT_OTHER): Payer: BLUE CROSS/BLUE SHIELD | Admitting: Internal Medicine

## 2016-04-21 ENCOUNTER — Inpatient Hospital Stay: Payer: BLUE CROSS/BLUE SHIELD | Attending: Internal Medicine

## 2016-04-21 VITALS — BP 156/84 | HR 66 | Temp 98.5°F | Resp 18 | Wt 233.5 lb

## 2016-04-21 DIAGNOSIS — Z79899 Other long term (current) drug therapy: Secondary | ICD-10-CM | POA: Diagnosis not present

## 2016-04-21 DIAGNOSIS — Z9221 Personal history of antineoplastic chemotherapy: Secondary | ICD-10-CM | POA: Insufficient documentation

## 2016-04-21 DIAGNOSIS — C859A Non-Hodgkin lymphoma, unspecified, in remission: Secondary | ICD-10-CM

## 2016-04-21 DIAGNOSIS — M25511 Pain in right shoulder: Secondary | ICD-10-CM | POA: Insufficient documentation

## 2016-04-21 DIAGNOSIS — M1990 Unspecified osteoarthritis, unspecified site: Secondary | ICD-10-CM | POA: Diagnosis not present

## 2016-04-21 DIAGNOSIS — J309 Allergic rhinitis, unspecified: Secondary | ICD-10-CM | POA: Insufficient documentation

## 2016-04-21 DIAGNOSIS — M75101 Unspecified rotator cuff tear or rupture of right shoulder, not specified as traumatic: Secondary | ICD-10-CM

## 2016-04-21 DIAGNOSIS — I2510 Atherosclerotic heart disease of native coronary artery without angina pectoris: Secondary | ICD-10-CM | POA: Insufficient documentation

## 2016-04-21 DIAGNOSIS — Z9484 Stem cells transplant status: Secondary | ICD-10-CM

## 2016-04-21 DIAGNOSIS — E876 Hypokalemia: Secondary | ICD-10-CM | POA: Diagnosis not present

## 2016-04-21 DIAGNOSIS — Z8572 Personal history of non-Hodgkin lymphomas: Secondary | ICD-10-CM | POA: Insufficient documentation

## 2016-04-21 DIAGNOSIS — Z8619 Personal history of other infectious and parasitic diseases: Secondary | ICD-10-CM | POA: Insufficient documentation

## 2016-04-21 DIAGNOSIS — J8482 Adult pulmonary Langerhans cell histiocytosis: Secondary | ICD-10-CM | POA: Diagnosis not present

## 2016-04-21 DIAGNOSIS — G8929 Other chronic pain: Secondary | ICD-10-CM

## 2016-04-21 DIAGNOSIS — Z8042 Family history of malignant neoplasm of prostate: Secondary | ICD-10-CM | POA: Diagnosis not present

## 2016-04-21 DIAGNOSIS — I10 Essential (primary) hypertension: Secondary | ICD-10-CM | POA: Insufficient documentation

## 2016-04-21 DIAGNOSIS — E7800 Pure hypercholesterolemia, unspecified: Secondary | ICD-10-CM | POA: Insufficient documentation

## 2016-04-21 DIAGNOSIS — G629 Polyneuropathy, unspecified: Secondary | ICD-10-CM | POA: Diagnosis not present

## 2016-04-21 DIAGNOSIS — Z859 Personal history of malignant neoplasm, unspecified: Secondary | ICD-10-CM | POA: Insufficient documentation

## 2016-04-21 DIAGNOSIS — N909 Noninflammatory disorder of vulva and perineum, unspecified: Secondary | ICD-10-CM

## 2016-04-21 DIAGNOSIS — G4730 Sleep apnea, unspecified: Secondary | ICD-10-CM | POA: Insufficient documentation

## 2016-04-21 DIAGNOSIS — E039 Hypothyroidism, unspecified: Secondary | ICD-10-CM | POA: Diagnosis not present

## 2016-04-21 DIAGNOSIS — C8590 Non-Hodgkin lymphoma, unspecified, unspecified site: Secondary | ICD-10-CM

## 2016-04-21 LAB — CBC WITH DIFFERENTIAL/PLATELET
BASOS ABS: 0 10*3/uL (ref 0–0.1)
BASOS PCT: 1 %
EOS ABS: 0 10*3/uL (ref 0–0.7)
EOS PCT: 1 %
HCT: 39.9 % (ref 35.0–47.0)
Hemoglobin: 13.7 g/dL (ref 12.0–16.0)
Lymphocytes Relative: 35 %
Lymphs Abs: 1.2 10*3/uL (ref 1.0–3.6)
MCH: 29.9 pg (ref 26.0–34.0)
MCHC: 34.4 g/dL (ref 32.0–36.0)
MCV: 87.1 fL (ref 80.0–100.0)
MONO ABS: 0.3 10*3/uL (ref 0.2–0.9)
Monocytes Relative: 8 %
Neutro Abs: 1.9 10*3/uL (ref 1.4–6.5)
Neutrophils Relative %: 55 %
PLATELETS: 179 10*3/uL (ref 150–440)
RBC: 4.58 MIL/uL (ref 3.80–5.20)
RDW: 14.5 % (ref 11.5–14.5)
WBC: 3.4 10*3/uL — AB (ref 3.6–11.0)

## 2016-04-21 LAB — HEPATIC FUNCTION PANEL
ALBUMIN: 3.5 g/dL (ref 3.5–5.0)
ALT: 17 U/L (ref 14–54)
AST: 22 U/L (ref 15–41)
Alkaline Phosphatase: 79 U/L (ref 38–126)
BILIRUBIN TOTAL: 0.6 mg/dL (ref 0.3–1.2)
Bilirubin, Direct: <0.1 mg/dL — ABNORMAL LOW (ref 0.1–0.5)
Total Protein: 6.6 g/dL (ref 6.5–8.1)

## 2016-04-21 LAB — LACTATE DEHYDROGENASE: LDH: 142 U/L (ref 98–192)

## 2016-04-21 LAB — CREATININE, SERUM
Creatinine, Ser: 0.79 mg/dL (ref 0.44–1.00)
GFR calc non Af Amer: >60 mL/min (ref >60)

## 2016-04-21 NOTE — Progress Notes (Signed)
[place] OFFICE PROGRESS NOTE  Patient Care Team: Jackolyn Confer, MD as PCP - General (Internal Medicine)   SUMMARY OF ONCOLOGIC HISTORY:  # 2011- DLBCL with mesenteric mass & liver involvement s/p chemo- s/p Autotransplant [JUNE 2012; [facility]] s/p IT  # 2014-Localized Pagets disease of Vulva s/p resection [Dr.Secord]; last re-exciosn Oct 2016  # Shingles prophylaxis secondary- Valtrex 500 mg twice a day   INTERVAL HISTORY:  65 year old female patient with above history of diffuse large B-cell lymphoma stage IV status post autologous stem cell transplant in 2012 is here for follow-up....  Denies any unusual shortness of breath cough or abdominal pain distention night sweats or weight loss. Patient has chronic right shoulder pain/secondary to Rotator cuff tear; limited range of motion.  Patient is off magnesium and potassium supplementation. Continues to be on Valtrex  REVIEW OF SYSTEMS:  A complete 10 point review of system is done which is negative except mentioned above/history of present illness.   PAST MEDICAL HISTORY :  Past Medical History  Diagnosis Date  . 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 Madelynn Done s/p RCHOP and methotrexate, c/b renal failure  . Hypothyroidism   . Paget disease, extra mammary     vulva, s/p resection 2014, Dr. Sabra Heck  . Allergic rhinitis   . CAD (coronary artery disease)   . Arthritis   . Peripheral neuropathy ([place])   . Sleep apnea   . 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 H/O stem cell transplant [facility]) Jul 2012  . Large cell lymphoma ([place])   . History of chemotherapy 22-Oct-2010    RHCOP/methotrexate-intrathecal  . Hypertension   . Hypercholesterolemia   . Esophagitis   . Hemorrhoids   . Diverticulosis     PAST SURGICAL HISTORY :   Past Surgical History  Procedure Laterality Date  . Tonsillectomy  1954  . Limbal stem cell  transplant  2012  . Liver biopsy      stage 4B large Bcell lymphoma  . Burr hole w/ placement ommaya reservoir    . Breast biopsy  2002    Neg - AT [facility]  . Abdominal hysterectomy  1985    Hysterectomy-partial  . Vulvectomy    . Shoulder arthroscopy Right 06/22/2015    Procedure: ARTHROSCOPY SHOULDER, parital repair of rotator cuff, biceps tenodesis, decompression and debridement;  Surgeon: Corky Mull, MD;  Location: [facility] ORS;  Service: Orthopedics;  Laterality: Right;  . Insertion central venous access device w/ subcutaneous port  2011    Port a Cath: Right chest Double Lumen, 04-Nov-2010  . Vulvectomy partial N/A 09/29/2015    Procedure: VULVECTOMY PARTIAL;  Surgeon: Gillis Ends, MD;  Location: [facility] ORS;  Service: Gynecology;  Laterality: N/A;  . Colonoscopy  04/2013  . Esophagogastroduodenoscopy  04/2013  . Burr hole w/ placement ommaya reservoir  2012    FAMILY HISTORY :   Family History  Problem Relation Age of Onset  . Diabetes Mother   . Hypertension Mother   . Melanoma Father 69    Melanoma  . Kidney disease Sister     Kidney removed   . Prostate cancer Brother     Prostate - dx in 11's  . Hearing loss Maternal Aunt   . Hearing loss Maternal Grandfather   . Heart disease Brother 16    MI & DVT in heart    SOCIAL  HISTORY:   Social History  Substance Use Topics  . Smoking status: Never Smoker   . Smokeless tobacco: Never Used  . Alcohol Use: No     Comment: former user-no current use    ALLERGIES:  is allergic to erythromycin; iodine; morphine and related; sulfa antibiotics; and adhesive.  MEDICATIONS:  Current Outpatient Prescriptions  Medication Sig Dispense Refill  . fluconazole (DIFLUCAN) 150 MG tablet Take 1 tablet (150 mg total) by mouth once a week. 4 tablet 5  . lansoprazole (PREVACID) 30 MG capsule take 1 capsule by mouth once daily 30 capsule 5  . levothyroxine (SYNTHROID, LEVOTHROID) 88 MCG tablet Take 1 tablet (88 mcg total) by mouth  daily before breakfast. 30 tablet 3  . mometasone (ELOCON) 0.1 % cream APPLY TO AFFECTED AREA ON CHIN AND ELBOWS TWICE A DAY UNTIL CLEAR, THEN AS NEEDED FOR FLARES  0  . nystatin (MYCOSTATIN/NYSTOP) 100000 UNIT/GM POWD Apply topically as directed twice daily 60 g 3  . valACYclovir (VALTREX) 500 MG tablet take 1 tablet by mouth twice a day 60 tablet 3   No current facility-administered medications for this visit.    PHYSICAL EXAMINATION: ECOG PERFORMANCE STATUS: 0 - Asymptomatic  BP 156/84 mmHg  Pulse 66  Temp(Src) 98.5 F (36.9 C) (Tympanic)  Resp 18  Wt 233 lb 7.5 oz (105.9 kg)  Filed Weights   04/21/16 0838  Weight: 233 lb 7.5 oz (105.9 kg)    GENERAL: Well-nourished well-developed; Alert, no distress and comfortable.   Alone.  EYES: no pallor or icterus OROPHARYNX: no thrush or ulceration; good dentition  NECK: supple, no masses felt LYMPH:  no palpable lymphadenopathy in the cervical, axillary or inguinal regions LUNGS: clear to auscultation and  No wheeze or crackles HEART/CVS: regular rate & rhythm and no murmurs; No lower extremity edema ABDOMEN:abdomen soft, non-tender and normal bowel sounds Musculoskeletal:no cyanosis of digits and no clubbing  PSYCH: alert & oriented x 3 with fluent speech NEURO: no focal motor/sensory deficits SKIN:  no rashes or significant lesions; patient has Ommaya port.  LABORATORY DATA:  I have reviewed the data as listed    Component Value Date/Time   NA 140 03/02/2016 0816   NA 138 09/03/2013 0809   K 4.0 03/02/2016 0816   K 4.3 11/02/2014 0932   CL 104 03/02/2016 0816   CL 105 09/03/2013 0809   CO2 30 03/02/2016 0816   CO2 28 09/03/2013 0809   GLUCOSE 86 03/02/2016 0816   GLUCOSE 100* 09/03/2013 0809   BUN 21 03/02/2016 0816   BUN 17 09/03/2013 0809   CREATININE 0.84 03/02/2016 0816   CREATININE 0.96 11/02/2014 0929   CALCIUM 9.6 03/02/2016 0816   CALCIUM 9.1 09/03/2013 0809   PROT 6.8 03/02/2016 0816   PROT 6.6 11/02/2014  0929   ALBUMIN 3.7 03/02/2016 0816   ALBUMIN 3.3* 11/02/2014 0929   AST 15 03/02/2016 0816   AST 16 11/02/2014 0929   ALT 13 03/02/2016 0816   ALT 19 11/02/2014 0929   ALKPHOS 78 03/02/2016 0816   ALKPHOS 87 11/02/2014 0929   BILITOT 0.4 03/02/2016 0816   BILITOT 0.3 11/02/2014 0929   GFRNONAA >60 10/22/2015 0830   GFRNONAA >60 11/02/2014 0929   GFRNONAA >60 07/27/2014 0836   GFRAA >60 10/22/2015 0830   GFRAA >60 11/02/2014 0929   GFRAA >60 07/27/2014 0836    No results found for: SPEP, UPEP  Lab Results  Component Value Date   WBC 3.4* 04/21/2016   NEUTROABS 1.9  04/21/2016   HGB 13.7 04/21/2016   HCT 39.9 04/21/2016   MCV 87.1 04/21/2016   PLT 179 04/21/2016      Chemistry      Component Value Date/Time   NA 140 03/02/2016 0816   NA 138 09/03/2013 0809   K 4.0 03/02/2016 0816   K 4.3 11/02/2014 0932   CL 104 03/02/2016 0816   CL 105 09/03/2013 0809   CO2 30 03/02/2016 0816   CO2 28 09/03/2013 0809   BUN 21 03/02/2016 0816   BUN 17 09/03/2013 0809   CREATININE 0.84 03/02/2016 0816   CREATININE 0.96 11/02/2014 0929      Component Value Date/Time   CALCIUM 9.6 03/02/2016 0816   CALCIUM 9.1 09/03/2013 0809   ALKPHOS 78 03/02/2016 0816   ALKPHOS 87 11/02/2014 0929   AST 15 03/02/2016 0816   AST 16 11/02/2014 0929   ALT 13 03/02/2016 0816   ALT 19 11/02/2014 0929   BILITOT 0.4 03/02/2016 0816   BILITOT 0.3 11/02/2014 0929       RADIOGRAPHIC STUDIES: I have personally reviewed the radiological images as listed and agreed with the findings in the report. No results found.   ASSESSMENT & PLAN:   # DLBCL- Stage IV status post autologous symptoms of transplant in 2012. Clinically no evidence of recurrence. CBC unremarkable except for slightly low white count of 3.3 differential normal. Chemistries from today pending.  # paget's disease of Vulva- status post recent resection in October 2016. Clear margins. Follow-up with GYN oncology every 3 months.  #  Hypokalemia/hypomagnesemia- currently off for the last 6 months most recent labs from PCP a month ago with normal limits.  # History of shingles- which reoccurred 2 years after her transplant/stopping Valtrex. Offer to have her see ID she declined.  # Patient follow-up with me in one year with CBC CMP magnesium.    Cammie Sickle, MD 04/21/2016 8:48 AM

## 2016-05-04 ENCOUNTER — Other Ambulatory Visit: Payer: Self-pay | Admitting: Family Medicine

## 2016-05-10 ENCOUNTER — Inpatient Hospital Stay: Payer: BLUE CROSS/BLUE SHIELD | Attending: Obstetrics and Gynecology | Admitting: Obstetrics and Gynecology

## 2016-05-10 VITALS — BP 152/87 | HR 69 | Temp 98.3°F | Ht 63.0 in | Wt 233.5 lb

## 2016-05-10 DIAGNOSIS — Z9221 Personal history of antineoplastic chemotherapy: Secondary | ICD-10-CM | POA: Diagnosis not present

## 2016-05-10 DIAGNOSIS — E039 Hypothyroidism, unspecified: Secondary | ICD-10-CM | POA: Diagnosis not present

## 2016-05-10 DIAGNOSIS — I2510 Atherosclerotic heart disease of native coronary artery without angina pectoris: Secondary | ICD-10-CM

## 2016-05-10 DIAGNOSIS — C4499 Other specified malignant neoplasm of skin, unspecified: Secondary | ICD-10-CM

## 2016-05-10 DIAGNOSIS — F4323 Adjustment disorder with mixed anxiety and depressed mood: Secondary | ICD-10-CM

## 2016-05-10 DIAGNOSIS — E785 Hyperlipidemia, unspecified: Secondary | ICD-10-CM | POA: Diagnosis not present

## 2016-05-10 DIAGNOSIS — I10 Essential (primary) hypertension: Secondary | ICD-10-CM

## 2016-05-10 DIAGNOSIS — C519 Malignant neoplasm of vulva, unspecified: Secondary | ICD-10-CM | POA: Diagnosis not present

## 2016-05-10 DIAGNOSIS — Z79899 Other long term (current) drug therapy: Secondary | ICD-10-CM

## 2016-05-10 DIAGNOSIS — G629 Polyneuropathy, unspecified: Secondary | ICD-10-CM

## 2016-05-10 DIAGNOSIS — G4730 Sleep apnea, unspecified: Secondary | ICD-10-CM | POA: Diagnosis not present

## 2016-05-10 DIAGNOSIS — Z9484 Stem cells transplant status: Secondary | ICD-10-CM

## 2016-05-10 DIAGNOSIS — Z6841 Body Mass Index (BMI) 40.0 and over, adult: Secondary | ICD-10-CM | POA: Diagnosis not present

## 2016-05-10 DIAGNOSIS — M1990 Unspecified osteoarthritis, unspecified site: Secondary | ICD-10-CM | POA: Diagnosis not present

## 2016-05-10 DIAGNOSIS — Z8579 Personal history of other malignant neoplasms of lymphoid, hematopoietic and related tissues: Secondary | ICD-10-CM | POA: Diagnosis not present

## 2016-05-10 NOTE — Progress Notes (Signed)
Gynecologic Oncology Visit   Referring Provider: Dr. Kenton Kingfisher.  Chief Concern: Recurrent paget's disease of the vulva, postoperative visit  Subjective:  Bailey Brooks is a 65 y.o. female who is seen in consultation from Dr. Kenton Kingfisher for Paget's disease of the vulva.   She presents for her surveillance visit after undergoing WLE, and vulvar biopsy 09/29/2015.  She was treated for yeast infection post op in 12/17 and took one Diflucan a week for six months with good results.   She has done well since her surgery and has no complaints of vulvar itching or irritation now.   Gynecologic Oncology History: Mrs. Abdul has a history of localized vulvar Paget's disease.   04/2013    vulvar biopsy revealed Paget's disease               WLE, additional margins resected for positive disease on Fro[place] evaluation.   Part A: VULVA, VAGINAL MARGIN:  - POSITIVE FOR EXTRAMAMMARY PAGET'S DISEASE.  Marland Kitchen  Part B: VULVA, POSTERIOR MARGIN:  - POSITIVE FOR EXTRAMAMMARY PAGET'S DISEASE.  Marland Kitchen  Part C: VULVA, RIGHT, PARTIAL VULVECTOMY:  - EXTRAMAMMARY PAGET'S DISEASE.  Marland Kitchen  Part D: VULVA, NEW VAGINAL MARGIN:  - POSITIVE FOR EXTRAMAMMARY PAGET'S DISEASE.   07/28/2015 for routine surveillance an area on the right vulva seemed more suspicious for recurrence. This was biopsied and confirmed recurrent Paget's disease.   She underwent repeat excision 09/29/15 A. VULVA; EXCISION:  - RARE CYTOKERATIN 7 POSITIVE INTRAEPIDERMAL CELLS, INTERPRETED AS  RESIDUAL PAGET DISEASE.  - THE SURGICAL MARGINS ARE CLEAR.   B. VULVA, 11:00; BIOPSY:  - RARE CYTOKERATIN 7 POSITIVE INTRAEPIDERMAL CELLS, INTERPRETED AS PAGET  DISEASE.   Prior to surgery she received 5 weekly doses of oral diflucan for vulvar candidiasis.    Problem List: Patient Active Problem List   Diagnosis Date Noted  . Recurrent candidiasis of vagina 03/02/2016  . Hypomagnesemia 03/02/2016  . Morbid obesity due to excess calories ([place]) 03/02/2016  .  Paget's disease of vulva   . Injury of tendon of upper extremity 06/24/2015  . Complete rotator cuff rupture of left shoulder 06/21/2015  . Adjustment disorder with mixed anxiety and depressed mood 05/25/2015  . Knee pain, right 05/25/2015  . Duodenitis 05/21/2014  . Esophagitis 05/21/2014  . 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 Routine general medical examination at a health care facility 02/27/2013  . Eczema 02/27/2013  . Muscle cramps 11/13/2012  . Screening for breast cancer 11/13/2012  . Personal history of lymphoma 02/20/2012  . Hypothyroidism 02/20/2012  . Hyperlipidemia 02/20/2012    Past Medical History: Past Medical History  Diagnosis Date  . 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 Madelynn Done s/p RCHOP and methotrexate, c/b renal failure  . Hypothyroidism   . Paget disease, extra mammary     vulva, s/p resection 2014, Dr. Sabra Heck  . Allergic rhinitis   . CAD (coronary artery disease)   . Arthritis   . Peripheral neuropathy ([place])   . Sleep apnea   . 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 H/O stem cell transplant [facility]) Jul 2012  . Large cell lymphoma ([place])   . History of chemotherapy 22-Oct-2010    RHCOP/methotrexate-intrathecal  . Hypertension   . Hypercholesterolemia   . Esophagitis   . Hemorrhoids   . Diverticulosis     Past Surgical History: Past Surgical History  Procedure Laterality Date  . Tonsillectomy  1954  . Limbal stem cell transplant  2012  . Liver biopsy      stage 4B large Bcell lymphoma  . Burr hole w/ placement ommaya reservoir    . Breast biopsy  2002    Neg - AT [facility]  . Abdominal hysterectomy  1985    Hysterectomy-partial  . Vulvectomy    . Shoulder arthroscopy Right 06/22/2015    Procedure: ARTHROSCOPY SHOULDER, parital repair of rotator cuff, biceps tenodesis, decompression and debridement;  Surgeon: Corky Mull,  MD;  Location: [facility] ORS;  Service: Orthopedics;  Laterality: Right;  . Insertion central venous access device w/ subcutaneous port  2011    Port a Cath: Right chest Double Lumen, 04-Nov-2010  . Vulvectomy partial N/A 09/29/2015    Procedure: VULVECTOMY PARTIAL;  Surgeon: Gillis Ends, MD;  Location: [facility] ORS;  Service: Gynecology;  Laterality: N/A;  . Colonoscopy  04/2013  . Esophagogastroduodenoscopy  04/2013  . Trudee Kuster hole w/ placement ommaya reservoir  2012    Past Gynecologic History:  S/p hysterectomy  OB History:  OB History  No data available    Family History: Family History  Problem Relation Age of Onset  . Diabetes Mother   . Hypertension Mother   . Melanoma Father 70    Melanoma  . Kidney disease Sister     Kidney removed   . Prostate cancer Brother     Prostate - dx in 61's  . Hearing loss Maternal Aunt   . Hearing loss Maternal Grandfather   . Heart disease Brother 61    MI & DVT in heart    Social History: Social History   Social History  . Marital Status: Married    Spouse Name: N/A  . Number of Children: 2  . Years of Education: N/A   Occupational History  . 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 Smoking status: Never Smoker   . Smokeless tobacco: Never Used  . Alcohol Use: No     Comment: former user-no current use  . Drug Use: No  . Sexual Activity:    Partners: Male   Other Topics Concern  . Not on file   Social History Narrative   Lives in [place] with husband, has 2 children boys.  No pets. 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    Allergies: Allergies  Allergen Reactions  . Erythromycin Nausea And Vomiting  . Iodine Swelling    IV iodine  . Morphine And Related Nausea And Vomiting    Hallucinations   . Sulfa Antibiotics Other (See Comments)    Dizzy/Fainting  . Adhesive [Tape] Other (See Comments) and Rash    Including band-aide Including band-aide    Current Medications: Current  Outpatient Prescriptions  Medication Sig Dispense Refill  . fluconazole (DIFLUCAN) 150 MG tablet Take 1 tablet (150 mg total) by mouth once a week. 4 tablet 5  . lansoprazole (PREVACID) 30 MG capsule take 1 capsule by mouth once daily 30 capsule 5  . levothyroxine (SYNTHROID, LEVOTHROID) 88 MCG tablet Take 1 tablet (88 mcg total) by mouth daily before breakfast. 30 tablet 3  . mometasone (ELOCON) 0.1 % cream APPLY TO AFFECTED AREA ON CHIN AND ELBOWS TWICE A DAY UNTIL CLEAR, THEN AS NEEDED FOR FLARES  0  . nystatin (MYCOSTATIN/NYSTOP) 100000 UNIT/GM POWD Apply topically as directed twice daily 60 g 3  . valACYclovir (VALTREX) 500 MG tablet take 1  tablet by mouth twice a day 60 tablet 3   No current facility-administered medications for this visit.     Objective:  Physical Examination:  BP 152/87 mmHg  Pulse 69  Temp(Src) 98.3 F (36.8 C) (Tympanic)  Ht 5\' 3"  (1.6 m)  Wt 233 lb 7.5 oz (105.9 kg)  BMI 41.37 kg/m2   Body mass index is 41.37 kg/(m^2).    ECOG Performance Status: 0 - Asymptomatic  General appearance: alert, cooperative and appears stated age HEENT:PERRLA, extra ocular movement intact and sclera clear, anicteric Neurological exam reveals alert, oriented, normal speech, no focal findings Lungs: clear to a and p Heart: regular rate and rhythm Pelvic: exam chaperoned by nurse;  Vulva: vulvar scars after resection well healed. No gross lesions or candida infection.  Vagina: normal.  Bimanual: no masses      Assessment:  JALEIYA SEITH is a 65 y.o. female diagnosed with recurrent Paget's disease of the vulva s/p WLE with positive margins with subsequent recurrence diagnosed 07/2015. Repeat WLE c/w recurrent Paget's disease of the vulva s/p WLE with negative margins and biopsy with rare CK7 + intradermal cells interpreted as Paget's disease, clinically asymptomatic. Recurrent vulvar candidiasis.    Plan:   Problem List Items Addressed This Visit      Musculoskeletal  and Integument   Paget disease, extra mammary - Primary (Chronic)    Other Visit Diagnoses    Vulvar candidiasis      I recommended continued close surveillance for Paget's disease with repeat exam and visit in 6 months. While the biopsy was performed and revealed rare Paget cells I do not think further resection is needed at this time as no gross visible lesion is present.   With regard to the vulvar candidiasis she received prolonged treatment with oral diflucan with evidence of recurrence. Per UpToDate the following approaches listed below are acceptable. I have recommended the first option with topical therapy and provided the patient with options. She will contact us if she has increased symptoms or concerns. At this point she is asymptomatic, but given the extent involved I have recommended treatment.   She will RTC in 6 months or sooner if symptoms.  I mentioned to her that we are now using Aldera for treatment of Paget's with some success and that this might be an option for her in the future if she has recurrence.    Mellody Drown, MD

## 2016-05-10 NOTE — Progress Notes (Signed)
  Oncology Nurse Navigator Documentation  Navigator Location: [facility] (05/10/16 1100) Navigator Encounter Type: [facility] Follow-up (05/10/16 1100)                                          Time Spent with Patient: 15 (05/10/16 1100)   Chaperoned pelvic exam. Continues on weekly diflucan for 6 months for recurrent yeast.

## 2016-05-10 NOTE — Progress Notes (Signed)
Patient her for follow up. No concerns today.

## 2016-06-26 ENCOUNTER — Telehealth: Payer: Self-pay | Admitting: *Deleted

## 2016-06-26 NOTE — Telephone Encounter (Signed)
Patient requested to have her 07/06/16 appt moved up to be seen sooner/ with Dr. Gilford Rile . Pt informed  that she can be placed with the Margret if she has o be seen sooner.

## 2016-06-26 NOTE — Telephone Encounter (Signed)
It will need to be with margaret as Gilford Rile has no openings, thanks

## 2016-06-28 ENCOUNTER — Ambulatory Visit (INDEPENDENT_AMBULATORY_CARE_PROVIDER_SITE_OTHER): Payer: BLUE CROSS/BLUE SHIELD | Admitting: Family Medicine

## 2016-06-28 ENCOUNTER — Encounter: Payer: Self-pay | Admitting: Family Medicine

## 2016-06-28 DIAGNOSIS — M25562 Pain in left knee: Secondary | ICD-10-CM | POA: Diagnosis not present

## 2016-06-28 MED ORDER — DICLOFENAC SODIUM 75 MG PO TBEC: 75.0000 mg | DELAYED_RELEASE_TABLET | Freq: Two times a day (BID) | ORAL | 0 refills | Status: DC

## 2016-06-28 NOTE — Assessment & Plan Note (Addendum)
New acute problem. Likely secondary to OA and morbid obesity. No indication for imaging at this time. Treating with Diclofenac.

## 2016-06-28 NOTE — Progress Notes (Signed)
Subjective:  Patient ID: Bailey Brooks, female    DOB: [birth year]  Age: 65 y.o. MRN: [id number]  CC: Knee pain  HPI:  65 year old female presents to the clinic today with complaints of left knee pain.  Patient states that last Wednesday she was going up some steps and got to the top and had difficulty ambulating. No fall or trauma. No twisting injury. Patient states that since that time she's had left knee pain particularly behind the knee. She also reports hip and back pain. Pain is moderate in severity. Worse with sitting and arising from a sitting position. She has taken Tylenol and ibuprofen without significant improvement. No swelling or erythema.  No other complaints at this time.  Social Hx   Social History   Social History  . Marital status: Married    Spouse name: N/A  . Number of children: 2  . Years of education: N/A   Occupational History  . 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 Smoking status: Never Smoker  . Smokeless tobacco: Never Used  . Alcohol use No     Comment: former user-no current use  . Drug use: No  . Sexual activity: Yes    Partners: Male   Other Topics Concern  . None   Social History Narrative   Lives in [place] with husband, has 2 children boys.  No pets. 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   Review of Systems  Constitutional: Negative.   Musculoskeletal: Positive for back pain.       Left knee pain.    Objective:  BP (!) 136/98 (BP Location: Right Arm, Patient Position: Sitting, Cuff Size: Large)   Pulse 95   Temp 98.7 F (37.1 C) (Oral)   Wt 236 lb 12 oz (107.4 kg)   SpO2 97%   BMI 41.94 kg/m   BP/Weight 06/28/2016 05/10/2016 [id number]  Systolic BP [id number]  Diastolic BP 98 87 84  Wt. (Lbs) 236.75 233.47 233.47  BMI 41.94 41.37 40.05   Physical Exam  Constitutional: She is oriented to person, place, and time. She appears well-developed. No distress.  Morbidly obese.  Pulmonary/Chest:  Effort normal.  Musculoskeletal:  Knee: Left  Normal to inspection with no erythema or effusion or obvious bony abnormalities.  Palpation normal.  Ligaments with solid consistent endpoints including ACL, PCL, LCL, MCL. Negative Mcmurray's ROM with crepitus.  Left hip - good ROM, no tenderness of the greater trochanter  Neurological: She is alert and oriented to person, place, and time.  Psychiatric: She has a normal mood and affect.  Vitals reviewed.  Lab Results  Component Value Date   WBC 3.4 (L) 04/21/2016   HGB 13.7 04/21/2016   HCT 39.9 04/21/2016   PLT 179 04/21/2016   GLUCOSE 86 03/02/2016   CHOL 247 (H) 03/02/2016   TRIG 139.0 03/02/2016   HDL 44.80 03/02/2016   LDLDIRECT 157.9 02/27/2013   LDLCALC 175 (H) 03/02/2016   ALT 17 04/21/2016   AST 22 04/21/2016   NA 140 03/02/2016   K 4.0 03/02/2016   CL 104 03/02/2016   CREATININE 0.79 04/21/2016   BUN 21 03/02/2016   CO2 30 03/02/2016   TSH 3.01 04/05/2016   HGBA1C 5.6 03/02/2016   MICROALBUR 1.8 11/23/2014    Assessment & Plan:   Problem List Items Addressed This Visit    Left knee pain    New acute problem. Likely secondary to OA and morbid obesity. No  indication for imaging at this time. Treating with Diclofenac.       Other Visit Diagnoses   None.     Meds ordered this encounter  Medications  . diclofenac (VOLTAREN) 75 MG EC tablet    Sig: Take 1 tablet (75 mg total) by mouth 2 (two) times daily.    Dispense:  60 tablet    Refill:  0   Follow-up: PRN  [place]

## 2016-06-28 NOTE — Progress Notes (Signed)
Pre visit review using our clinic review tool, if applicable. No additional management support is needed unless otherwise documented below in the visit note. 

## 2016-06-28 NOTE — Patient Instructions (Signed)
Use the diclofenac twice daily as needed.  Follow up if you fail to improve or worsen.  Take care  Dr. Lacinda Axon

## 2016-07-06 ENCOUNTER — Encounter: Payer: Self-pay | Admitting: Internal Medicine

## 2016-07-06 ENCOUNTER — Ambulatory Visit (INDEPENDENT_AMBULATORY_CARE_PROVIDER_SITE_OTHER): Payer: BLUE CROSS/BLUE SHIELD | Admitting: Internal Medicine

## 2016-07-06 VITALS — BP 156/100 | HR 66 | Ht 64.5 in | Wt 237.0 lb

## 2016-07-06 DIAGNOSIS — B373 Candidiasis of vulva and vagina: Secondary | ICD-10-CM | POA: Diagnosis not present

## 2016-07-06 DIAGNOSIS — M25562 Pain in left knee: Secondary | ICD-10-CM | POA: Diagnosis not present

## 2016-07-06 DIAGNOSIS — I10 Essential (primary) hypertension: Secondary | ICD-10-CM | POA: Diagnosis not present

## 2016-07-06 DIAGNOSIS — B3731 Acute candidiasis of vulva and vagina: Secondary | ICD-10-CM

## 2016-07-06 MED ORDER — AMLODIPINE BESYLATE 2.5 MG PO TABS: 2.5000 mg | ORAL_TABLET | Freq: Every day | ORAL | 3 refills | Status: DC

## 2016-07-06 NOTE — Assessment & Plan Note (Signed)
Left knee pain secondary to OA. Significant improvement with Diclofenac. Will continue. Discussed potential risks of this medication. Encouraged weight loss.

## 2016-07-06 NOTE — Progress Notes (Signed)
Pre visit review using our clinic review tool, if applicable. No additional management support is needed unless otherwise documented below in the visit note. 

## 2016-07-06 NOTE — Assessment & Plan Note (Signed)
Wt Readings from Last 3 Encounters:  07/06/16 237 lb (107.5 kg)  06/28/16 236 lb 12 oz (107.4 kg)  05/10/16 233 lb 7.5 oz (105.9 kg)   Body mass index is 40.05 kg/m. Encouraged healthy diet and exercise with goal of weight loss. Discussed activities such as water aerobics, yoga, biking.

## 2016-07-06 NOTE — Patient Instructions (Signed)
Start Amlodipine 2.5mg  daily.  Follow up in 4 weeks.

## 2016-07-06 NOTE — Progress Notes (Signed)
Subjective:    Patient ID: Bailey Brooks, female    DOB: 10-20-1951, 65 y.o.   MRN: [id number]  HPI  65YO female presents for follow up.  Recently seen by Dr. Lacinda Axon for left knee pain. Started on Diclofenac. Knee pain is much improved. No side effects noted.  HTN - BP has been elevated last few months. Has not been on BP medications since she was treated for Lymphoma. No recent HA, CP, palpitations. Does not check BP at home.  Yeast infection in groin area has resolved. Continues on prolonged course of Fluconazole.    Wt Readings from Last 3 Encounters:  07/06/16 237 lb (107.5 kg)  06/28/16 236 lb 12 oz (107.4 kg)  05/10/16 233 lb 7.5 oz (105.9 kg)   BP Readings from Last 3 Encounters:  07/06/16 (!) 156/100  06/28/16 (!) 136/98  05/10/16 (!) 152/87    Past Medical History:  Diagnosis Date  . 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 Allergic rhinitis   . Arthritis   . CAD (coronary artery disease)   . 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 Madelynn Done s/p RCHOP and methotrexate, c/b renal failure  . Diverticulosis   . Esophagitis   . H/O stem cell transplant [facility]) Jul 2012  . Hemorrhoids   . History of chemotherapy 22-Oct-2010   RHCOP/methotrexate-intrathecal  . Hypercholesterolemia   . Hypertension   . Hypothyroidism   . Large cell lymphoma ([facility])   . Paget disease, extra mammary    vulva, s/p resection 2014, Dr. Sabra Heck  . Peripheral neuropathy ([place])   . Sleep apnea    Family History  Problem Relation Age of Onset  . Diabetes Mother   . Hypertension Mother   . Melanoma Father 33    Melanoma  . Kidney disease Sister     Kidney removed   . Prostate cancer Brother     Prostate - dx in 87's  . Hearing loss Maternal Aunt   . Hearing loss Maternal Grandfather   . Heart disease Brother 35    MI & DVT in heart   Past Surgical History:  Procedure Laterality Date  . ABDOMINAL  HYSTERECTOMY  1985   Hysterectomy-partial  . BREAST BIOPSY  2002   Neg - AT [facility]  . BURR HOLE W/ PLACEMENT OMMAYA RESERVOIR    . BURR HOLE W/ PLACEMENT OMMAYA RESERVOIR  2012  . COLONOSCOPY  04/2013  . ESOPHAGOGASTRODUODENOSCOPY  04/2013  . INSERTION CENTRAL VENOUS ACCESS DEVICE W/ SUBCUTANEOUS PORT  2011   Port a Cath: Right chest Double Lumen, 04-Nov-2010  . LIMBAL STEM CELL TRANSPLANT  2012  . LIVER BIOPSY     stage 4B large Bcell lymphoma  . SHOULDER ARTHROSCOPY Right 06/22/2015   Procedure: ARTHROSCOPY SHOULDER, parital repair of rotator cuff, biceps tenodesis, decompression and debridement;  Surgeon: Corky Mull, MD;  Location: [facility] ORS;  Service: Orthopedics;  Laterality: Right;  . TONSILLECTOMY  1954  . VULVECTOMY    . VULVECTOMY PARTIAL N/A 09/29/2015   Procedure: VULVECTOMY PARTIAL;  Surgeon: Gillis Ends, MD;  Location: [facility] ORS;  Service: Gynecology;  Laterality: N/A;   Social History   Social History  . Marital status: Married    Spouse name: N/A  . Number of children: 2  . Years of education: N/A   Occupational History  . 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 Smoking status: Never Smoker  . Smokeless tobacco: Never Used  . Alcohol use No     Comment: former user-no current use  . Drug use: No  . Sexual activity: Yes    Partners: Male   Other Topics Concern  . None   Social History Narrative   Lives in [place] with husband, has 2 children boys.  No pets. 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    Review of Systems  Constitutional: Negative for appetite change, chills, fatigue, fever and unexpected weight change.  Eyes: Negative for visual disturbance.  Respiratory: Negative for shortness of breath.   Cardiovascular: Negative for chest pain and leg swelling.  Gastrointestinal: Negative for abdominal pain, constipation, diarrhea, nausea and vomiting.  Musculoskeletal: Positive for arthralgias and myalgias. Negative  for gait problem.  Skin: Negative for color change and rash.  Hematological: Negative for adenopathy. Does not bruise/bleed easily.  Psychiatric/Behavioral: Negative for dysphoric mood. The patient is not nervous/anxious.        Objective:    BP (!) 156/100 (BP Location: Left Arm, Patient Position: Sitting, Cuff Size: Large)   Pulse 66   Ht 5' 4.5" (1.638 m)   Wt 237 lb (107.5 kg)   BMI 40.05 kg/m  Physical Exam  Constitutional: She is oriented to person, place, and time. She appears well-developed and well-nourished. No distress.  HENT:  Head: Normocephalic and atraumatic.  Right Ear: External ear normal.  Left Ear: External ear normal.  Nose: Nose normal.  Mouth/Throat: Oropharynx is clear and moist. No oropharyngeal exudate.  Eyes: Conjunctivae are normal. Pupils are equal, round, and reactive to light. Right eye exhibits no discharge. Left eye exhibits no discharge. No scleral icterus.  Neck: Normal range of motion. Neck supple. No tracheal deviation present. No thyromegaly present.  Cardiovascular: Normal rate, regular rhythm, normal heart sounds and intact distal pulses.  Exam reveals no gallop and no friction rub.   No murmur heard. Pulmonary/Chest: Effort normal and breath sounds normal. No respiratory distress. She has no wheezes. She has no rales. She exhibits no tenderness.  Musculoskeletal: Normal range of motion. She exhibits no edema or tenderness.  Lymphadenopathy:    She has no cervical adenopathy.  Neurological: She is alert and oriented to person, place, and time. No cranial nerve deficit. She exhibits normal muscle tone. Coordination normal.  Skin: Skin is warm and dry. No rash noted. She is not diaphoretic. No erythema. No pallor.  Psychiatric: She has a normal mood and affect. Her behavior is normal. Judgment and thought content normal.          Assessment & Plan:   Problem List Items Addressed This Visit      Unprioritized   Essential hypertension -  Primary    BP Readings from Last 3 Encounters:  07/06/16 (!) 156/100  06/28/16 (!) 136/98  05/10/16 (!) 152/87   BP elevated. Will start Amlodipine 2.5mg  daily. Encouraged her to monitor BP at home 1-2 times per week and bring reading to next visit.      Relevant Medications   amLODipine (NORVASC) 2.5 MG tablet   Left knee pain    Left knee pain secondary to OA. Significant improvement with Diclofenac. Will continue. Discussed potential risks of this medication. Encouraged weight loss.      Morbid obesity due to excess calories ([place])    Wt Readings from Last 3 Encounters:  07/06/16 237 lb (107.5 kg)  06/28/16 236 lb 12 oz (107.4 kg)  05/10/16 233  lb 7.5 oz (105.9 kg)   Body mass index is 40.05 kg/m. Encouraged healthy diet and exercise with goal of weight loss. Discussed activities such as water aerobics, yoga, biking.      Recurrent candidiasis of vagina    Currently on prolonged course of Fluconazole, given persistent vaginal candidiasis. Symptomatically much improved. Will continue for 6 months total.       Other Visit Diagnoses   None.      Return in about 4 weeks (around 08/03/2016) for New Patient.  Ronette Deter, MD [facility]

## 2016-07-06 NOTE — Assessment & Plan Note (Signed)
Currently on prolonged course of Fluconazole, given persistent vaginal candidiasis. Symptomatically much improved. Will continue for 6 months total.

## 2016-07-06 NOTE — Assessment & Plan Note (Signed)
BP Readings from Last 3 Encounters:  07/06/16 (!) 156/100  06/28/16 (!) 136/98  05/10/16 (!) 152/87   BP elevated. Will start Amlodipine 2.5mg  daily. Encouraged her to monitor BP at home 1-2 times per week and bring reading to next visit.

## 2016-07-10 ENCOUNTER — Other Ambulatory Visit: Payer: Self-pay | Admitting: Internal Medicine

## 2016-08-03 ENCOUNTER — Ambulatory Visit (INDEPENDENT_AMBULATORY_CARE_PROVIDER_SITE_OTHER): Payer: BLUE CROSS/BLUE SHIELD | Admitting: Family Medicine

## 2016-08-03 ENCOUNTER — Encounter: Payer: Self-pay | Admitting: Family Medicine

## 2016-08-03 VITALS — BP 138/82 | HR 68 | Temp 98.5°F | Wt 242.2 lb

## 2016-08-03 DIAGNOSIS — M25551 Pain in right hip: Secondary | ICD-10-CM

## 2016-08-03 DIAGNOSIS — M25552 Pain in left hip: Secondary | ICD-10-CM | POA: Insufficient documentation

## 2016-08-03 DIAGNOSIS — I10 Essential (primary) hypertension: Secondary | ICD-10-CM

## 2016-08-03 DIAGNOSIS — G8929 Other chronic pain: Secondary | ICD-10-CM | POA: Insufficient documentation

## 2016-08-03 MED ORDER — DICLOFENAC SODIUM 75 MG PO TBEC: 75.0000 mg | DELAYED_RELEASE_TABLET | Freq: Two times a day (BID) | ORAL | 0 refills | Status: DC

## 2016-08-03 NOTE — Assessment & Plan Note (Signed)
At goal today. Continue amlodipine 2.5 mg daily. Encouraged to monitor her blood pressure at home.

## 2016-08-03 NOTE — Progress Notes (Signed)
Pre visit review using our clinic review tool, if applicable. No additional management support is needed unless otherwise documented below in the visit note. 

## 2016-08-03 NOTE — Assessment & Plan Note (Signed)
Likely related to osteoarthritis given recent x-ray findings. Does note numbness in her right fifth toe though no other numbness or neurological deficits in the lower extremities. Could be from nerve compression at any level from her toe to her back. Discussed options of physical therapy versus x-ray of her back versus neurology referral. Patient and I opted for physical therapy to start with and then considering x-ray of her back and then if no improvement or if there is worsening referral to neurology. Physical therapy referral was placed. Diclofenac refilled. Given return precautions.

## 2016-08-03 NOTE — Progress Notes (Signed)
  [name], MD Phone: [phone]  Bailey Brooks is a 65 y.o. female who presents today for follow-up.  HYPERTENSION  Disease Monitoring  Home BP Monitoring not checking Chest pain- no    Dyspnea- no Medications  Compliance-  taking amlodipine.  Edema- minimal intermittent swelling in her ankles. Notes this occurs at the end of the day. Improves with propping them up. No orthopnea or PND.  Right hip pain: Patient notes worsening history of right hip pain. Has osteoarthritis in her hip. Occurs intermittently. Mostly occurs when she is walking. Does note some mild numbness in her right little toe. No weakness or numbness elsewhere in her lower extremities. Notes the toes been numb for the last several months. No back pain though she notes they did see osteoarthritis in her back on most recent x-ray. Does take some diclofenac for this and this is beneficial.  PMH: nonsmoker.   ROS see history of present illness  Objective  Physical Exam Vitals:   08/03/16 1602  BP: 138/82  Pulse: 68  Temp: 98.5 F (36.9 C)    BP Readings from Last 3 Encounters:  08/03/16 138/82  07/06/16 (!) 156/100  06/28/16 (!) 136/98   Wt Readings from Last 3 Encounters:  08/03/16 242 lb 3.2 oz (109.9 kg)  07/06/16 237 lb (107.5 kg)  06/28/16 236 lb 12 oz (107.4 kg)    Physical Exam  Constitutional: No distress.  Cardiovascular: Normal rate, regular rhythm and normal heart sounds.   Pulmonary/Chest: Effort normal and breath sounds normal.  Musculoskeletal:  Full range of motion bilateral hips no pain, no tenderness of palpation of lateral hips bilaterally, no tenderness or swelling in bilateral knees  Neurological: She is alert. Gait normal.  5 out of 5 strength bilateral quads, allergies, plantar flexion, and dorsiflexion, sensation to light touch decreased in her right fifth toe, otherwise intact in bilateral lower extremities  Skin: Skin is warm and dry. She is not diaphoretic.      Assessment/Plan: Please see individual problem list.  Essential hypertension At goal today. Continue amlodipine 2.5 mg daily. Encouraged to monitor her blood pressure at home.  Right hip pain Likely related to osteoarthritis given recent x-ray findings. Does note numbness in her right fifth toe though no other numbness or neurological deficits in the lower extremities. Could be from nerve compression at any level from her toe to her back. Discussed options of physical therapy versus x-ray of her back versus neurology referral. Patient and I opted for physical therapy to start with and then considering x-ray of her back and then if no improvement or if there is worsening referral to neurology. Physical therapy referral was placed. Diclofenac refilled. Given return precautions.   Orders Placed This Encounter  Procedures  . Ambulatory referral to Physical Therapy    Referral Priority:   Routine    Referral Type:   Physical Medicine    Referral Reason:   Specialty Services Required    Requested Specialty:   Physical Therapy    Number of Visits Requested:   1    Meds ordered this encounter  Medications  . diclofenac (VOLTAREN) 75 MG EC tablet    Sig: Take 1 tablet (75 mg total) by mouth 2 (two) times daily.    Dispense:  60 tablet    Refill:  0    [name], MD [place]

## 2016-08-03 NOTE — Patient Instructions (Signed)
Nice to see you. We will refill your diclofenac. We will refer you to physical therapy. Please start to check your blood pressure. If it is persistently greater than 140/90 please let us know. If you develop any further numbness or you develop weakness or any new or changing symptoms please seek medical attention.

## 2016-08-28 ENCOUNTER — Ambulatory Visit: Payer: BLUE CROSS/BLUE SHIELD | Attending: Family Medicine | Admitting: Physical Therapy

## 2016-08-28 ENCOUNTER — Encounter: Payer: Self-pay | Admitting: Physical Therapy

## 2016-08-28 DIAGNOSIS — M6281 Muscle weakness (generalized): Secondary | ICD-10-CM | POA: Diagnosis not present

## 2016-08-28 DIAGNOSIS — R262 Difficulty in walking, not elsewhere classified: Secondary | ICD-10-CM | POA: Diagnosis not present

## 2016-08-28 NOTE — Patient Instructions (Signed)
[email or web address]  iso clamshells with RTB, 3x10 x 6 sec hold bil supine hip flexor stretch, 3x1 min Standing hip abd with RTB, 3x10  1x/day for each

## 2016-08-28 NOTE — Therapy (Signed)
[facility] [address] [place], [address] Phone: [phone]   Fax:  [phone]  Physical Therapy Evaluation  Patient Details  Name: Bailey Brooks MRN: [id number] Date of Birth: 12/17/1950 Referring Provider: Dr. Caryl Bis  Encounter Date: 08/28/2016      PT End of Session - 08/28/16 1058    Visit Number 1   Number of Visits 9   Date for PT Re-Evaluation 09/25/16   PT Start Time 0952   PT Stop Time 1042   PT Time Calculation (min) 50 min   Activity Tolerance Patient tolerated treatment well   Behavior During Therapy [facility] for tasks assessed/performed      Past Medical History:  Diagnosis Date  . 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 Allergic rhinitis   . Arthritis   . CAD (coronary artery disease)   . 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 Madelynn Done s/p RCHOP and methotrexate, c/b renal failure  . Diverticulosis   . Esophagitis   . H/O stem cell transplant [facility]) Jul 2012  . Hemorrhoids   . History of chemotherapy 22-Oct-2010   RHCOP/methotrexate-intrathecal  . Hypercholesterolemia   . Hypertension   . Hypothyroidism   . Large cell lymphoma ([place])   . Paget disease, extra mammary    vulva, s/p resection 2014, Dr. Sabra Heck  . Peripheral neuropathy ([place])   . Sleep apnea     Past Surgical History:  Procedure Laterality Date  . ABDOMINAL HYSTERECTOMY  1985   Hysterectomy-partial  . BREAST BIOPSY  2002   Neg - AT [facility]  . BURR HOLE W/ PLACEMENT OMMAYA RESERVOIR    . BURR HOLE W/ PLACEMENT OMMAYA RESERVOIR  2012  . COLONOSCOPY  04/2013  . ESOPHAGOGASTRODUODENOSCOPY  04/2013  . INSERTION CENTRAL VENOUS ACCESS DEVICE W/ SUBCUTANEOUS PORT  2011   Port a Cath: Right chest Double Lumen, 04-Nov-2010  . LIMBAL STEM CELL TRANSPLANT  2012  . LIVER BIOPSY     stage 4B large Bcell lymphoma  . SHOULDER ARTHROSCOPY  Right 06/22/2015   Procedure: ARTHROSCOPY SHOULDER, parital repair of rotator cuff, biceps tenodesis, decompression and debridement;  Surgeon: Corky Mull, MD;  Location: [facility] ORS;  Service: Orthopedics;  Laterality: Right;  . TONSILLECTOMY  1954  . VULVECTOMY    . VULVECTOMY PARTIAL N/A 09/29/2015   Procedure: VULVECTOMY PARTIAL;  Surgeon: Gillis Ends, MD;  Location: [facility] ORS;  Service: Gynecology;  Laterality: N/A;    There were no vitals filed for this visit.       Subjective Assessment - 08/28/16 1054    Subjective Pt reports having R hip pain intermittently for 2-3 days at a time, which has been going on for a year or longer. She states that when it is not hurting she still feels like she has less mobility of the R hip. She states she sometimes can't lift her leg high enough; last year when she was running she tripped and tore her R RTC. She reports pain when laying on her R hip, but isn't sure if it is from the hip or lower back. She states she has intermittent numbness down RLE but she can't pinpoint an exact cause. She states that her RLE feels weaker than her LLE. She states walking up an incline or up steps are difficult and she leads with LLE. After sitting down for a long time,  it is difficult to get up. If her hip is not hurting her, she can walk for a long time. The hip pain limits her from exercising which she wants to get back to. She states walking or running are the main causes of pain.   Pertinent History hx of lymphoma 10 years ago, CAD,    Limitations Sitting;Lifting;Standing;Walking   Patient Stated Goals to get R hip better   Currently in Pain? No/denies            [facility] PT Assessment - 08/28/16 0001      Assessment   Medical Diagnosis R hip pain   Referring Provider Dr. Caryl Bis   Onset Date/Surgical Date 08/29/15   Prior Therapy not for present condition     Precautions   Precautions None     Restrictions   Weight Bearing Restrictions No      Balance Screen   Has the patient fallen in the past 6 months No   Has the patient had a decrease in activity level because of a fear of falling?  Yes   Is the patient reluctant to leave their home because of a fear of falling?  No     Home Ecologist residence   Living Arrangements Spouse/significant other   Available Help at Discharge Family   Type of [place] to enter   Entrance Stairs-Number of Steps 3   Entrance Stairs-Rails None   Home Layout One level     Prior Function   Level of Independence Independent     SENSATION  WNL BLE  AROM BLE AROM WNL PROM R hip ER painful in gluteal region, reported as same pain  STRENGTH (on scale of 0-5/5)  Left Right  Hip flexion 4 4  Hip extension 3+ 3+  Hip IR/ER    Hip abduction 4 3+  Hip adduction    Knee flexion 4+ 4+  Knee extension 4+ 4+  Ankle DF 5 5   PALPATION Increased soft tissue restrictions and TTP of bil glutes, piriformis, R>L Increased quad and hip flexor tightness and pain with Ely's test Pt reported HS cramps throughout session with bridging, hip flexor stretch, and on contralateral LE when performing SLR Bil HS length WNL Lumbar and thoracic spine hypomobile, did not recreate pain with Grade 2-3 mobs  GAIT Increased toe out, foot pronation, knee valgus, hip IR, Trendelenberg bil throughout gait  10MWT: 11.75 sec; limited community ambulator  SPECIAL TESTS Ely's: + BLE   Therex: Iso clamshells with strap, 2x10 with 6 sec holds Bil hip flexor stretch, supine with leg hanging off edge of mat Standing hip abd x 12 BLE with no resistance and x 12 BLE with RTB       PT Education - 08/28/16 1058    Education provided Yes   Education Details exam findings, POC, HEP   Person(s) Educated Patient   Methods Explanation   Comprehension Verbalized understanding             PT Long Term Goals - 08/28/16 1119      PT LONG TERM GOAL #1   Title  pt will be independent with HEP to maximize overall function and decrease risk of reinjury.   Time 4   Period Weeks   Status New     PT LONG TERM GOAL #2   Title Pt will be able to walk/run in exercise class with no hip pain to demonstrate improved strength  and overall function.   Time 4   Period Weeks   Status New     PT LONG TERM GOAL #3   Title Pt will be able to demonstrate proper lifting mechanics to promote overall function at home and demonstrate increased strength.   Time 4   Period Weeks   Status New               Plan - 08/28/16 1059    Clinical Impression Statement Pt is pleasnat 65 YO F who presents to therapy with c/o intermittent R hip pain. When she is in pain, it limits her from walking for long distances or exercising. She has deficits in BLE strength (most notably glutes), core strength, gait, and hip flexor tightness bilaterally. Pt also hypomobile in lumbar spine but upon palpation, did not change or recreate hip pain. Pt reported "cramps in HS" throughout session when assessing for glute strength and hip flexor tightness; SPT and PT asked if pt was eating enough potassium and pt stated she used to take potassium but she does not anymore. PT educated pt that the cramps could be coming from compensation for glute weakness, arterial insufficiency, or electrolyte imbalance; pt verbalized understanding. Pt needs skilled PT intervention to maximize overall function.   Rehab Potential Fair   Clinical Impairments Affecting Rehab Potential comorbidities   PT Frequency 2x / week   PT Duration 4 weeks   PT Treatment/Interventions ADLs/Self Care Home Management;Electrical Stimulation;Moist Heat;Traction;Gait training;Stair training;Functional mobility training;Therapeutic activities;Therapeutic exercise;Balance training;Neuromuscular re-education;Patient/family education   PT Next Visit Plan reassess hip pain following exercises, manual therapy, R hip distraction, hip  strengthening   Consulted and Agree with Plan of Care Patient      Patient will benefit from skilled therapeutic intervention in order to improve the following deficits and impairments:  Abnormal gait, Decreased activity tolerance, Decreased endurance, Decreased mobility, Decreased strength, Difficulty walking, Hypomobility, Increased muscle spasms, Impaired flexibility, Obesity, Pain  Visit Diagnosis: Difficulty in walking, not elsewhere classified  Muscle weakness (generalized)     Problem List Patient Active Problem List   Diagnosis Date Noted  . Right hip pain 08/03/2016  . Essential hypertension 07/06/2016  . Left knee pain 06/28/2016  . Recurrent candidiasis of vagina 03/02/2016  . Hypomagnesemia 03/02/2016  . Morbid obesity due to excess calories ([place]) 03/02/2016  . Adjustment disorder with mixed anxiety and depressed mood 05/25/2015  . 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 Routine general medical examination at a health care facility 02/27/2013  . Eczema 02/27/2013  . Muscle cramps 11/13/2012  . Screening for breast cancer 11/13/2012  . Personal history of lymphoma 02/20/2012  . Hypothyroidism 02/20/2012  . Hyperlipidemia 02/20/2012    Geraldine Solar 08/28/2016, 11:24 AM  [place] [facility] [address] [place], [address] Phone: [phone]   Fax:  [phone]  Name: Bailey Brooks MRN: [id number] Date of Birth: [birth year]

## 2016-08-30 ENCOUNTER — Encounter: Payer: Self-pay | Admitting: Physical Therapy

## 2016-08-30 ENCOUNTER — Ambulatory Visit: Payer: BLUE CROSS/BLUE SHIELD | Admitting: Physical Therapy

## 2016-08-30 DIAGNOSIS — R262 Difficulty in walking, not elsewhere classified: Secondary | ICD-10-CM | POA: Diagnosis not present

## 2016-08-30 DIAGNOSIS — M6281 Muscle weakness (generalized): Secondary | ICD-10-CM

## 2016-08-30 NOTE — Therapy (Signed)
[place] [facility] [address], [place], [address] Phone: [phone]   Fax:  [phone]  Physical Therapy Treatment  Patient Details  Name: Bailey Brooks MRN: [id number] Date of Birth: 01/24/1951 Referring Provider: Dr. Caryl Bis  Encounter Date: 08/30/2016      PT End of Session - 08/30/16 1538    Visit Number 2   Number of Visits 9   Date for PT Re-Evaluation 09/25/16   PT Start Time 1350   PT Stop Time 1430   PT Time Calculation (min) 40 min   Activity Tolerance Patient tolerated treatment well   Behavior During Therapy [facility] for tasks assessed/performed      Past Medical History:  Diagnosis Date  . 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 Allergic rhinitis   . Arthritis   . CAD (coronary artery disease)   . 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 Madelynn Done s/p RCHOP and methotrexate, c/b renal failure  . Diverticulosis   . Esophagitis   . H/O stem cell transplant [facility]) Jul 2012  . Hemorrhoids   . History of chemotherapy 22-Oct-2010   RHCOP/methotrexate-intrathecal  . Hypercholesterolemia   . Hypertension   . Hypothyroidism   . Large cell lymphoma ([place])   . Paget disease, extra mammary    vulva, s/p resection 2014, Dr. Sabra Heck  . Peripheral neuropathy ([place])   . Sleep apnea     Past Surgical History:  Procedure Laterality Date  . ABDOMINAL HYSTERECTOMY  1985   Hysterectomy-partial  . BREAST BIOPSY  2002   Neg - AT [facility]  . BURR HOLE W/ PLACEMENT OMMAYA RESERVOIR    . BURR HOLE W/ PLACEMENT OMMAYA RESERVOIR  2012  . COLONOSCOPY  04/2013  . ESOPHAGOGASTRODUODENOSCOPY  04/2013  . INSERTION CENTRAL VENOUS ACCESS DEVICE W/ SUBCUTANEOUS PORT  2011   Port a Cath: Right chest Double Lumen, 04-Nov-2010  . LIMBAL STEM CELL TRANSPLANT  2012  . LIVER BIOPSY     stage 4B large Bcell lymphoma  . SHOULDER ARTHROSCOPY  Right 06/22/2015   Procedure: ARTHROSCOPY SHOULDER, parital repair of rotator cuff, biceps tenodesis, decompression and debridement;  Surgeon: Corky Mull, MD;  Location: [facility] ORS;  Service: Orthopedics;  Laterality: Right;  . TONSILLECTOMY  1954  . VULVECTOMY    . VULVECTOMY PARTIAL N/A 09/29/2015   Procedure: VULVECTOMY PARTIAL;  Surgeon: Gillis Ends, MD;  Location: [facility] ORS;  Service: Gynecology;  Laterality: N/A;    There were no vitals filed for this visit.      Subjective Assessment - 08/30/16 1351    Subjective Pt reports her R hip felt fine following last treatment session. She stated she was able to exercise about the same amount as usual with no pain.   Pertinent History hx of lymphoma 10 years ago, CAD,    Limitations Sitting;Lifting;Standing;Walking   Patient Stated Goals to get R hip better   Currently in Pain? No/denies     R hip isometric clamshells with belt, 3x10 with 6 sec holds  Bridging with ankle DF to maximize glute contraction, 3x10 with 2-3 sec hold at top; cues for proper technique  Sit <> stands with no UE support from chair height with RTB around knees to decrease valgus, 2x10  Lateral walking with RTB, 75ft x 2 laps; cues to decrease toe out bilaterally; pt c/o L knee pain throughout  Bil  SLS 5 x 10 sec hold each with light 1 UE support on back of chair throughout; increased difficulty on LLE due to knee pain   Bil fwd step ups with 1 riser x 10 on RLE, x 5 on LLE; pt had increased L knee pain when performing on LLE, pt reported pain behind L knee and had to sit down to help relieve pain; pt had increased sensitivity to palpation so given strap to perform HS stretch  Pt ambulated multiple laps in gym following L HS stretch and pt reported feeling much better.      PT Education - 08/30/16 1537    Education provided Yes   Education Details exercise technique, may drop therapy visits to 1x/week since she is not having hip pain anymore    Person(s) Educated Patient   Methods Explanation   Comprehension Verbalized understanding             PT Long Term Goals - 08/28/16 1119      PT LONG TERM GOAL #1   Title pt will be independent with HEP to maximize overall function and decrease risk of reinjury.   Time 4   Period Weeks   Status New     PT LONG TERM GOAL #2   Title Pt will be able to walk/run in exercise class with no hip pain to demonstrate improved strength and overall function.   Time 4   Period Weeks   Status New     PT LONG TERM GOAL #3   Title Pt will be able to demonstrate proper lifting mechanics to promote overall function at home and demonstrate increased strength.   Time 4   Period Weeks   Status New               Plan - 08/30/16 1539    Clinical Impression Statement Pt presented to therapy today with no hip pain; she was able to participate in her usual exercise program with no c/o pain in R hip. She tolerated glute strengthening well this date with some fatigue noted during session. Pt limited in session due to L knee pain. Since pt is not currently having R hip pain, she was educated on potentially decreasing therapy sessions to 1x/week to focus treatment sessions on creating a strong HEP to supplement the exercising she already does and maintian overall function. SPT asked pt be cognizant of R hip pain this weekend to see if she could tell if a certain activity recreated it. Pt verbalized understanding.   Rehab Potential Fair   Clinical Impairments Affecting Rehab Potential comorbidities   PT Frequency 2x / week   PT Duration 4 weeks   PT Treatment/Interventions ADLs/Self Care Home Management;Electrical Stimulation;Moist Heat;Traction;Gait training;Stair training;Functional mobility training;Therapeutic activities;Therapeutic exercise;Balance training;Neuromuscular re-education;Patient/family education   PT Next Visit Plan reassess hip pain following exercises, manual therapy, R hip  distraction, hip strengthening   Consulted and Agree with Plan of Care Patient      Patient will benefit from skilled therapeutic intervention in order to improve the following deficits and impairments:  Abnormal gait, Decreased activity tolerance, Decreased endurance, Decreased mobility, Decreased strength, Difficulty walking, Hypomobility, Increased muscle spasms, Impaired flexibility, Obesity, Pain  Visit Diagnosis: Difficulty in walking, not elsewhere classified  Muscle weakness (generalized)     Problem List Patient Active Problem List   Diagnosis Date Noted  . Right hip pain 08/03/2016  . Essential hypertension 07/06/2016  . Left knee pain 06/28/2016  . Recurrent candidiasis of vagina 03/02/2016  .  Hypomagnesemia 03/02/2016  . Morbid obesity due to excess calories ([place]) 03/02/2016  . Adjustment disorder with mixed anxiety and depressed mood 05/25/2015  . 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 Routine general medical examination at a health care facility 02/27/2013  . Eczema 02/27/2013  . Muscle cramps 11/13/2012  . Screening for breast cancer 11/13/2012  . Personal history of lymphoma 02/20/2012  . Hypothyroidism 02/20/2012  . Hyperlipidemia 02/20/2012   Geraldine Solar, SPT  Geraldine Solar 08/30/2016, 3:45 PM  [place] [facility] [address] [place], [address] Phone: [phone]   Fax:  [phone]  Name: Bailey Brooks MRN: [id number] Date of Birth: 1951/09/17

## 2016-09-04 ENCOUNTER — Ambulatory Visit: Payer: BLUE CROSS/BLUE SHIELD | Admitting: Physical Therapy

## 2016-09-12 ENCOUNTER — Encounter: Payer: BLUE CROSS/BLUE SHIELD | Admitting: Physical Therapy

## 2016-09-14 ENCOUNTER — Encounter: Payer: BLUE CROSS/BLUE SHIELD | Admitting: Physical Therapy

## 2016-09-18 ENCOUNTER — Other Ambulatory Visit: Payer: Self-pay | Admitting: *Deleted

## 2016-09-18 ENCOUNTER — Encounter: Payer: BLUE CROSS/BLUE SHIELD | Admitting: Physical Therapy

## 2016-09-18 MED ORDER — VALACYCLOVIR HCL 500 MG PO TABS: 500.0000 mg | ORAL_TABLET | Freq: Two times a day (BID) | ORAL | 3 refills | Status: DC

## 2016-09-20 ENCOUNTER — Encounter: Payer: BLUE CROSS/BLUE SHIELD | Admitting: Physical Therapy

## 2016-09-27 ENCOUNTER — Other Ambulatory Visit: Payer: Self-pay | Admitting: Family Medicine

## 2016-09-27 NOTE — Telephone Encounter (Signed)
Refilled at last Appt on 8/31, please advise for additional refill, thanks

## 2016-09-27 NOTE — Telephone Encounter (Signed)
Refill sent to pharmacy.   

## 2016-11-06 ENCOUNTER — Ambulatory Visit (INDEPENDENT_AMBULATORY_CARE_PROVIDER_SITE_OTHER): Payer: BLUE CROSS/BLUE SHIELD | Admitting: Family Medicine

## 2016-11-06 ENCOUNTER — Encounter: Payer: Self-pay | Admitting: Family Medicine

## 2016-11-06 VITALS — BP 136/84 | HR 76 | Temp 98.6°F | Wt 246.6 lb

## 2016-11-06 DIAGNOSIS — M25562 Pain in left knee: Secondary | ICD-10-CM | POA: Diagnosis not present

## 2016-11-06 DIAGNOSIS — R0602 Shortness of breath: Secondary | ICD-10-CM | POA: Insufficient documentation

## 2016-11-06 DIAGNOSIS — G8929 Other chronic pain: Secondary | ICD-10-CM

## 2016-11-06 DIAGNOSIS — E039 Hypothyroidism, unspecified: Secondary | ICD-10-CM | POA: Diagnosis not present

## 2016-11-06 DIAGNOSIS — I10 Essential (primary) hypertension: Secondary | ICD-10-CM

## 2016-11-06 NOTE — Assessment & Plan Note (Signed)
Some cold intolerance. Check TSH. Continue Synthroid.

## 2016-11-06 NOTE — Assessment & Plan Note (Signed)
Benign exam today. Suspect related to osteoarthritis. Offered physical therapy though she declined. She'll continue diclofenac as needed.

## 2016-11-06 NOTE — Progress Notes (Signed)
  [name], MD Phone: [phone]  Bailey Brooks is a 65 y.o. female who presents today for f/u.   HYPERTENSION  Disease Monitoring  Home BP Monitoring not checking Chest pain- no    Dyspnea- yes, see below Medications  Compliance-  taking amlodipine  Edema- mild, unchanged from previously   Exertional shortness of breath: Patient notes for some time she has been getting winded with climbing up stairs or if she walks really fast. She notes no chest pain with this. Notes it takes several minutes to improve after walking up stairs. She notes no orthopnea or PND. Not exercising very much. She's put a lot of weight on in the last year or so after having lost about 45 pounds.  Left knee pain: Had been hurting her until recently. Not hurting at this time. Just hurts. Occasionally gives out. No popping, locking, or catching. Takes diclofenac once a day and this is beneficial.  HYPOTHYROIDISM Disease Monitoring Weight changes: Weight gain  Skin Changes: No Heat/Cold intolerance: Some cold intolerance  Medication Monitoring Compliance:  Taking Synthroid Last TSH:   Lab Results  Component Value Date   TSH 3.01 04/05/2016    PMH: nonsmoker.   ROS see history of present illness  Objective  Physical Exam Vitals:   11/06/16 1605  BP: 136/84  Pulse: 76  Temp: 98.6 F (37 C)    BP Readings from Last 3 Encounters:  11/06/16 136/84  08/03/16 138/82  07/06/16 (!) 156/100   Wt Readings from Last 3 Encounters:  11/06/16 246 lb 9.6 oz (111.9 kg)  08/03/16 242 lb 3.2 oz (109.9 kg)  07/06/16 237 lb (107.5 kg)    Physical Exam  Constitutional: No distress.  HENT:  Head: Normocephalic and atraumatic.  Cardiovascular: Normal rate, regular rhythm and normal heart sounds.   Pulmonary/Chest: Effort normal and breath sounds normal.  Musculoskeletal: She exhibits no edema.  Bilateral knees with no swelling, warmth, erythema, joint line tenderness, or ligament laxity, negative  McMurray's bilaterally  Neurological: She is alert. Gait normal.  Skin: Skin is warm and dry. She is not diaphoretic.    EKG: Normal sinus rhythm, rate 68, no ST or T-wave changes   Assessment/Plan: Please see individual problem list.  Essential hypertension Not quite at goal given recent guideline changes. Discussed increasing her Norvasc or adding an additional medication though she was hesitant to do this. She will monitor at home and if not persistently less than 130/90 she will let us know.  Hypothyroidism Some cold intolerance. Check TSH. Continue Synthroid.  Exertional shortness of breath Suspect this is related to obesity and deconditioning though could be cardiac in nature. EKG today is reassuring. We'll check lab work as outlined below to evaluate for other causes. I advised echo or cardiology referral if she wanted to wait on the lab work. Depending on what the lab work shows we will proceed with either echo or cardiology referral.  Left knee pain Benign exam today. Suspect related to osteoarthritis. Offered physical therapy though she declined. She'll continue diclofenac as needed.   Orders Placed This Encounter  Procedures  . TSH  . CBC  . Comp Met (CMET)  . Magnesium  . EKG 12-Lead    [name], MD [place]

## 2016-11-06 NOTE — Assessment & Plan Note (Addendum)
Suspect this is related to obesity and deconditioning though could be cardiac in nature. EKG today is reassuring. We'll check lab work as outlined below to evaluate for other causes. I advised echo or cardiology referral if she wanted to wait on the lab work. Depending on what the lab work shows we will proceed with either echo or cardiology referral.

## 2016-11-06 NOTE — Patient Instructions (Signed)
Nice to see you. We are going to check some lab work to evaluate for a cause of your breathing issues. We'll check your thyroid function as well. Please monitor the left knee and if it continues to bother you we can refer you to physical therapy. Please check your blood pressure at home. If it is consistently greater than 130/90 please let us know. If you develop persistent shortness of breath, or develop chest pain, or any new or changing symptoms please seek medical attention immediately.

## 2016-11-06 NOTE — Progress Notes (Signed)
Pre visit review using our clinic review tool, if applicable. No additional management support is needed unless otherwise documented below in the visit note. 

## 2016-11-06 NOTE — Assessment & Plan Note (Signed)
Not quite at goal given recent guideline changes. Discussed increasing her Norvasc or adding an additional medication though she was hesitant to do this. She will monitor at home and if not persistently less than 130/90 she will let us know.

## 2016-11-07 LAB — CBC
HCT: 41.1 % (ref 36.0–46.0)
Hemoglobin: 13.7 g/dL (ref 12.0–15.0)
MCHC: 33.4 g/dL (ref 30.0–36.0)
MCV: 89.4 fl (ref 78.0–100.0)
PLATELETS: 214 10*3/uL (ref 150.0–400.0)
RBC: 4.6 Mil/uL (ref 3.87–5.11)
RDW: 14.5 % (ref 11.5–15.5)
WBC: 6.8 10*3/uL (ref 4.0–10.5)

## 2016-11-07 LAB — TSH: TSH: 3.27 u[IU]/mL (ref 0.35–4.50)

## 2016-11-07 LAB — COMPREHENSIVE METABOLIC PANEL
ALBUMIN: 3.8 g/dL (ref 3.5–5.2)
ALT: 20 U/L (ref 0–35)
AST: 22 U/L (ref 0–37)
Alkaline Phosphatase: 88 U/L (ref 39–117)
BILIRUBIN TOTAL: 0.3 mg/dL (ref 0.2–1.2)
BUN: 24 mg/dL — ABNORMAL HIGH (ref 6–23)
CALCIUM: 9.1 mg/dL (ref 8.4–10.5)
CO2: 30 meq/L (ref 19–32)
CREATININE: 0.93 mg/dL (ref 0.40–1.20)
Chloride: 105 mEq/L (ref 96–112)
GFR: 63.96 mL/min (ref >60.00)
Glucose, Bld: 98 mg/dL (ref 70–99)
Potassium: 3.7 mEq/L (ref 3.5–5.1)
Sodium: 141 mEq/L (ref 135–145)
Total Protein: 6.8 g/dL (ref 6.0–8.3)

## 2016-11-07 LAB — MAGNESIUM: MAGNESIUM: 2.1 mg/dL (ref 1.5–2.5)

## 2016-11-08 ENCOUNTER — Other Ambulatory Visit: Payer: Self-pay | Admitting: Family Medicine

## 2016-11-08 DIAGNOSIS — R0609 Other forms of dyspnea: Secondary | ICD-10-CM

## 2016-11-09 NOTE — Progress Notes (Signed)
Called specialty scheduling to schedule this appt. LVM with appt information. They will either call the patient to schedule or myself.

## 2016-11-13 ENCOUNTER — Other Ambulatory Visit: Payer: Self-pay | Admitting: Family Medicine

## 2016-11-14 ENCOUNTER — Other Ambulatory Visit: Payer: Self-pay

## 2016-11-14 ENCOUNTER — Ambulatory Visit (INDEPENDENT_AMBULATORY_CARE_PROVIDER_SITE_OTHER): Payer: BLUE CROSS/BLUE SHIELD

## 2016-11-14 DIAGNOSIS — R0609 Other forms of dyspnea: Secondary | ICD-10-CM

## 2016-11-15 ENCOUNTER — Encounter: Payer: Self-pay | Admitting: Obstetrics and Gynecology

## 2016-11-15 ENCOUNTER — Inpatient Hospital Stay: Payer: BLUE CROSS/BLUE SHIELD | Attending: Obstetrics and Gynecology | Admitting: Obstetrics and Gynecology

## 2016-11-15 VITALS — BP 149/88 | HR 83 | Temp 97.0°F | Ht 64.5 in | Wt 242.9 lb

## 2016-11-15 DIAGNOSIS — E7800 Pure hypercholesterolemia, unspecified: Secondary | ICD-10-CM | POA: Diagnosis not present

## 2016-11-15 DIAGNOSIS — Z9221 Personal history of antineoplastic chemotherapy: Secondary | ICD-10-CM | POA: Diagnosis not present

## 2016-11-15 DIAGNOSIS — E039 Hypothyroidism, unspecified: Secondary | ICD-10-CM | POA: Diagnosis not present

## 2016-11-15 DIAGNOSIS — I10 Essential (primary) hypertension: Secondary | ICD-10-CM | POA: Diagnosis not present

## 2016-11-15 DIAGNOSIS — B373 Candidiasis of vulva and vagina: Secondary | ICD-10-CM | POA: Diagnosis not present

## 2016-11-15 DIAGNOSIS — J8482 Adult pulmonary Langerhans cell histiocytosis: Secondary | ICD-10-CM

## 2016-11-15 DIAGNOSIS — R0602 Shortness of breath: Secondary | ICD-10-CM | POA: Diagnosis not present

## 2016-11-15 DIAGNOSIS — L309 Dermatitis, unspecified: Secondary | ICD-10-CM | POA: Diagnosis not present

## 2016-11-15 DIAGNOSIS — Z90710 Acquired absence of both cervix and uterus: Secondary | ICD-10-CM | POA: Diagnosis not present

## 2016-11-15 DIAGNOSIS — G629 Polyneuropathy, unspecified: Secondary | ICD-10-CM

## 2016-11-15 DIAGNOSIS — Z79899 Other long term (current) drug therapy: Secondary | ICD-10-CM | POA: Diagnosis not present

## 2016-11-15 DIAGNOSIS — F4323 Adjustment disorder with mixed anxiety and depressed mood: Secondary | ICD-10-CM | POA: Insufficient documentation

## 2016-11-15 DIAGNOSIS — Z8572 Personal history of non-Hodgkin lymphomas: Secondary | ICD-10-CM | POA: Diagnosis not present

## 2016-11-15 DIAGNOSIS — Z6841 Body Mass Index (BMI) 40.0 and over, adult: Secondary | ICD-10-CM | POA: Diagnosis not present

## 2016-11-15 DIAGNOSIS — G4730 Sleep apnea, unspecified: Secondary | ICD-10-CM

## 2016-11-15 DIAGNOSIS — C519 Malignant neoplasm of vulva, unspecified: Secondary | ICD-10-CM | POA: Diagnosis not present

## 2016-11-15 DIAGNOSIS — I2510 Atherosclerotic heart disease of native coronary artery without angina pectoris: Secondary | ICD-10-CM | POA: Diagnosis not present

## 2016-11-15 DIAGNOSIS — Z9484 Stem cells transplant status: Secondary | ICD-10-CM | POA: Diagnosis not present

## 2016-11-15 DIAGNOSIS — C4499 Other specified malignant neoplasm of skin, unspecified: Secondary | ICD-10-CM

## 2016-11-15 NOTE — Progress Notes (Signed)
Gynecologic Oncology Interval Visit   Referring Provider: Dr. Kenton Kingfisher.  Chief Concern: Recurrent paget's disease of the vulva  Subjective:  Bailey Brooks is a 65 y.o. female who is seen in consultation from Dr. Kenton Kingfisher for Paget's disease of the vulva.   She presents for her surveillance visit after undergoing WLE, and vulvar biopsy 09/29/2015.    She has no complaints of new lesions, vulvar itching or irritation.   Gynecologic Oncology History: Bailey Brooks has a history of localized vulvar Paget's disease.   04/2013    vulvar biopsy revealed Paget's disease               WLE, additional margins resected for positive disease on Frozen evaluation.   Part A: VULVA, VAGINAL MARGIN:  - POSITIVE FOR EXTRAMAMMARY PAGET'S DISEASE.  Marland Kitchen  Part B: VULVA, POSTERIOR MARGIN:  - POSITIVE FOR EXTRAMAMMARY PAGET'S DISEASE.  Marland Kitchen  Part C: VULVA, RIGHT, PARTIAL VULVECTOMY:  - EXTRAMAMMARY PAGET'S DISEASE.  Marland Kitchen  Part D: VULVA, NEW VAGINAL MARGIN:  - POSITIVE FOR EXTRAMAMMARY PAGET'S DISEASE.   07/28/2015 for routine surveillance an area on the right vulva seemed more suspicious for recurrence. This was biopsied and confirmed recurrent Paget's disease.   She underwent repeat excision 09/29/15 A. VULVA; EXCISION:  - RARE CYTOKERATIN 7 POSITIVE INTRAEPIDERMAL CELLS, INTERPRETED AS  RESIDUAL PAGET DISEASE.  - THE SURGICAL MARGINS ARE CLEAR.   B. VULVA, 11:00; BIOPSY:  - RARE CYTOKERATIN 7 POSITIVE INTRAEPIDERMAL CELLS, INTERPRETED AS PAGET  DISEASE.   After her surgery she has not had any further issues.    Of note she has had a h/o recurrent vulvar candidiasis. Prior to her vulvar surgery 09/2015 she received 5 weekly doses of oral diflucan for vulvar candidiasis. Postop she was treated for yeast infection again in 12/17 and then took one Diflucan a week for six months with good results.    Problem List: Patient Active Problem List   Diagnosis Date Noted  . Exertional shortness of breath  11/06/2016  . Right hip pain 08/03/2016  . Essential hypertension 07/06/2016  . Left knee pain 06/28/2016  . Recurrent candidiasis of vagina 03/02/2016  . Hypomagnesemia 03/02/2016  . Morbid obesity due to excess calories ([place]) 03/02/2016  . Adjustment disorder with mixed anxiety and depressed mood 05/25/2015  . 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 Routine general medical examination at a health care facility 02/27/2013  . Eczema 02/27/2013  . Muscle cramps 11/13/2012  . Screening for breast cancer 11/13/2012  . Personal history of lymphoma 02/20/2012  . Hypothyroidism 02/20/2012  . Hyperlipidemia 02/20/2012    Past Medical History: Past Medical History:  Diagnosis Date  . 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 Allergic rhinitis   . Arthritis   . CAD (coronary artery disease)   . 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 Madelynn Done s/p RCHOP and methotrexate, c/b renal failure  . Diverticulosis   . Esophagitis   . H/O stem cell transplant [facility]) Jul 2012  . Hemorrhoids   . History of chemotherapy 22-Oct-2010   RHCOP/methotrexate-intrathecal  . Hypercholesterolemia   . Hypertension   . Hypothyroidism   . Large cell lymphoma ([place])   . Paget disease, extra mammary    vulva, s/p resection 2014, Dr. Sabra Heck  . Peripheral neuropathy ([place])   . Sleep apnea     Past Surgical History: Past Surgical History:  Procedure Laterality Date  . ABDOMINAL HYSTERECTOMY  1985   Hysterectomy-partial  . BREAST BIOPSY  2002   Neg - AT [facility]  . BURR HOLE W/ PLACEMENT OMMAYA RESERVOIR    . BURR HOLE W/ PLACEMENT OMMAYA RESERVOIR  2012  . COLONOSCOPY  04/2013  . ESOPHAGOGASTRODUODENOSCOPY  04/2013  . INSERTION CENTRAL VENOUS ACCESS DEVICE W/ SUBCUTANEOUS PORT  2011   Port a Cath: Right chest Double Lumen, 04-Nov-2010  . LIMBAL STEM CELL TRANSPLANT  2012  . LIVER BIOPSY      stage 4B large Bcell lymphoma  . SHOULDER ARTHROSCOPY Right 06/22/2015   Procedure: ARTHROSCOPY SHOULDER, parital repair of rotator cuff, biceps tenodesis, decompression and debridement;  Surgeon: Corky Mull, MD;  Location: [facility] ORS;  Service: Orthopedics;  Laterality: Right;  . TONSILLECTOMY  1954  . VULVECTOMY    . VULVECTOMY PARTIAL N/A 09/29/2015   Procedure: VULVECTOMY PARTIAL;  Surgeon: Gillis Ends, MD;  Location: [facility] ORS;  Service: Gynecology;  Laterality: N/A;    Past Gynecologic History:  S/p hysterectomy  OB History:  OB History  No data available    Family History: Family History  Problem Relation Age of Onset  . Diabetes Mother   . Hypertension Mother   . Melanoma Father 41    Melanoma  . Kidney disease Sister     Kidney removed   . Prostate cancer Brother     Prostate - dx in 55's  . Heart disease Brother 57    MI & DVT in heart  . Hearing loss Maternal Aunt   . Hearing loss Maternal Grandfather     Social History: Social History   Social History  . Marital status: Married    Spouse name: N/A  . Number of children: 2  . Years of education: N/A   Occupational History  . 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 Smoking status: Never Smoker  . Smokeless tobacco: Never Used  . Alcohol use No     Comment: former user-no current use  . Drug use: No  . Sexual activity: Yes    Partners: Male   Other Topics Concern  . Not on file   Social History Narrative   Lives in [place] with husband, has 2 children boys.  No pets. 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    Allergies: Allergies  Allergen Reactions  . Erythromycin Nausea And Vomiting  . Iodine Swelling    IV iodine  . Morphine And Related Nausea And Vomiting    Hallucinations   . Sulfa Antibiotics Other (See Comments)    Dizzy/Fainting  . Adhesive [Tape] Other (See Comments) and Rash    Including band-aide Including band-aide    Current  Medications: Current Outpatient Prescriptions  Medication Sig Dispense Refill  . amLODipine (NORVASC) 2.5 MG tablet Take 1 tablet (2.5 mg total) by mouth daily. 90 tablet 3  . diclofenac (VOLTAREN) 75 MG EC tablet take 1 tablet by mouth twice a day 60 tablet 0  . lansoprazole (PREVACID) 30 MG capsule take 1 capsule by mouth once daily 30 capsule 5  . levothyroxine (SYNTHROID, LEVOTHROID) 88 MCG tablet take 1 tablet by mouth every morning ON AN EMPTY STOMACH 90 tablet 3  . mometasone (ELOCON) 0.1 % cream APPLY TO AFFECTED AREA ON CHIN AND ELBOWS TWICE A DAY UNTIL CLEAR, THEN AS NEEDED FOR FLARES  0  . nystatin (MYCOSTATIN/NYSTOP) 100000 UNIT/GM POWD Apply topically as directed twice daily 60  g 3  . valACYclovir (VALTREX) 500 MG tablet Take 1 tablet (500 mg total) by mouth 2 (two) times daily. 60 tablet 3   No current facility-administered medications for this visit.    General: no complaints  HEENT: no complaints  Lungs: no complaints  Cardiac: no complaints  GI: no complaints  GU: no complaints  Musculoskeletal: no complaints  Extremities: no complaints  Skin: no complaints  Neuro: no complaints  Endocrine: no complaints  Psych: no complaints       Objective:  Physical Examination:  BP (!) 150/101 (BP Location: Left Wrist, Patient Position: Sitting)   Pulse 83   Temp 97 F (36.1 C) (Tympanic)   Ht 5' 4.5" (1.638 m)   Wt 242 lb 15.2 oz (110.2 kg)   BMI 41.06 kg/m    Body mass index is 41.06 kg/m.    ECOG Performance Status: 0 - Asymptomatic  General appearance: alert, cooperative and appears stated age HEENT:PERRLA, extra ocular movement intact and sclera clear, anicteric Neurological exam reveals alert, oriented, normal speech, no focal findings Extremities: negative Lymphatic survery: negative for inguinal adenopathy Pelvic: exam chaperoned by nurse;  Vulva: vulvar scars after resection well healed. No gross lesions or candida infection.  Vagina: normal.  Bimanual:  no masses. Cervix/uterus surgically absent.     Assessment:  Bailey Brooks is a 65 y.o. female diagnosed with recurrent Paget's disease of the vulva s/p WLE with positive margins with subsequent recurrence diagnosed 07/2015. Repeat WLE c/w recurrent Paget's disease of the vulva s/p WLE with negative margins and biopsy with rare CK7 + intradermal cells interpreted as Paget's disease, clinically asymptomatic. NED on exam.  H/o recurrent vulvar candidiasis, resolved after 6 month course of Diflucan.    Plan:   Problem List Items Addressed This Visit      Musculoskeletal and Integument   Paget disease, extra mammary - Primary (Chronic)     I recommended continued close surveillance for Paget's disease with repeat exam and visit in 6 months. If she develops gross evidence of recurrent Paget's disease, then we can consider Aldara therapy.    She will RTC in 6 months or sooner if symptoms.   Gillis Ends, MD

## 2016-11-15 NOTE — Progress Notes (Signed)
ADDENDUM:  Repeat BP 149/88. She was encouraged to follow up with her PCP for her other medical issues.  Gillis Ends, MD

## 2016-11-15 NOTE — Progress Notes (Signed)
  Oncology Nurse Navigator Documentation Chaperoned pelvic exam. No current needs. Navigator Location: [facility] (11/15/16 0900)   )Navigator Encounter Type: Clinic/MDC (11/15/16 0900)                 Multidisiplinary Clinic Type: GYN (11/15/16 0900)                                  Time Spent with Patient: 15 (11/15/16 0900)

## 2016-11-15 NOTE — Progress Notes (Signed)
Patient here for follow up. No changes since last appointment.  

## 2016-11-16 ENCOUNTER — Telehealth: Payer: Self-pay | Admitting: Family Medicine

## 2016-11-16 ENCOUNTER — Other Ambulatory Visit: Payer: Self-pay | Admitting: Family Medicine

## 2016-11-16 DIAGNOSIS — R0600 Dyspnea, unspecified: Secondary | ICD-10-CM

## 2016-11-16 NOTE — Telephone Encounter (Signed)
Patient notified of results.

## 2016-11-16 NOTE — Telephone Encounter (Signed)
Pt called back returning your call. Thank you!  Call pt @ [phone]

## 2016-11-20 IMAGING — CR DG HIP (WITH OR WITHOUT PELVIS) 2-3V*R*
3 series · 3 of 3 positions shown · non-contrast
Comparison: PET-CT 04/22/2013.

CLINICAL DATA: Right hip pain.  No reported injury.

EXAM:
DG HIP (WITH OR WITHOUT PELVIS) 2-3V RIGHT

[pelvis ap]
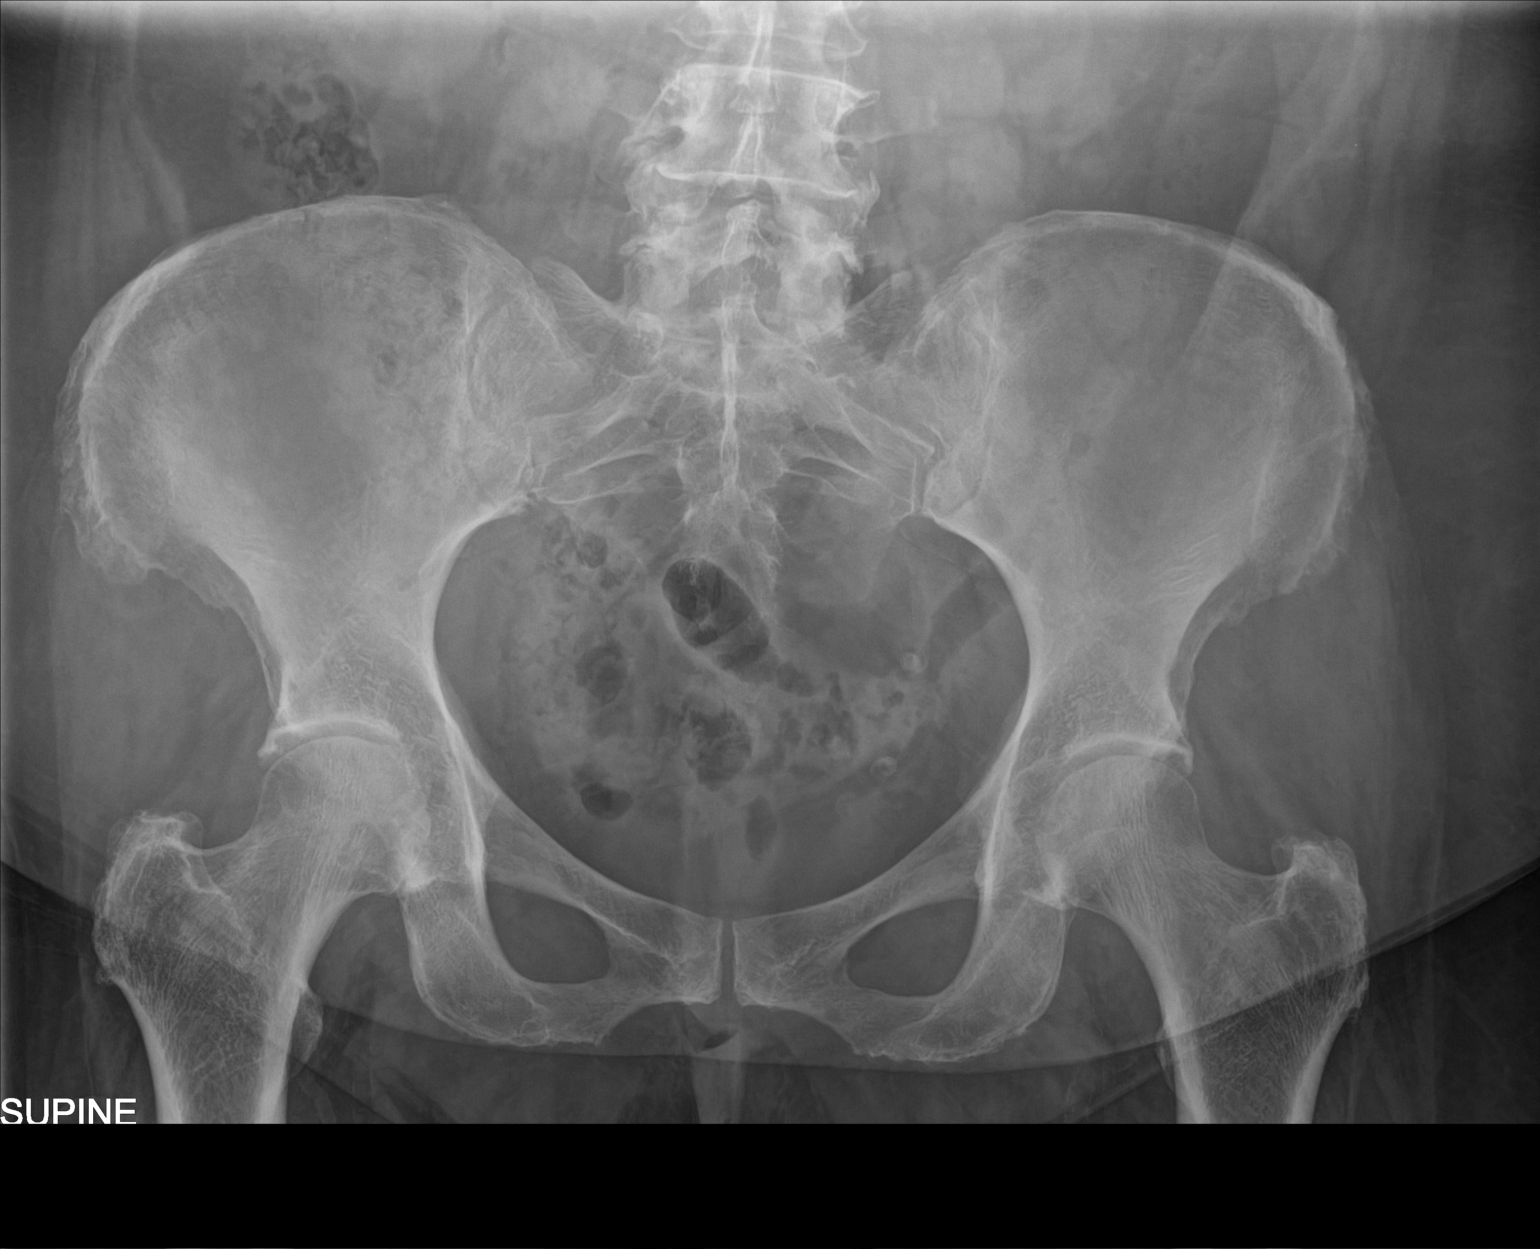

[hip ap]
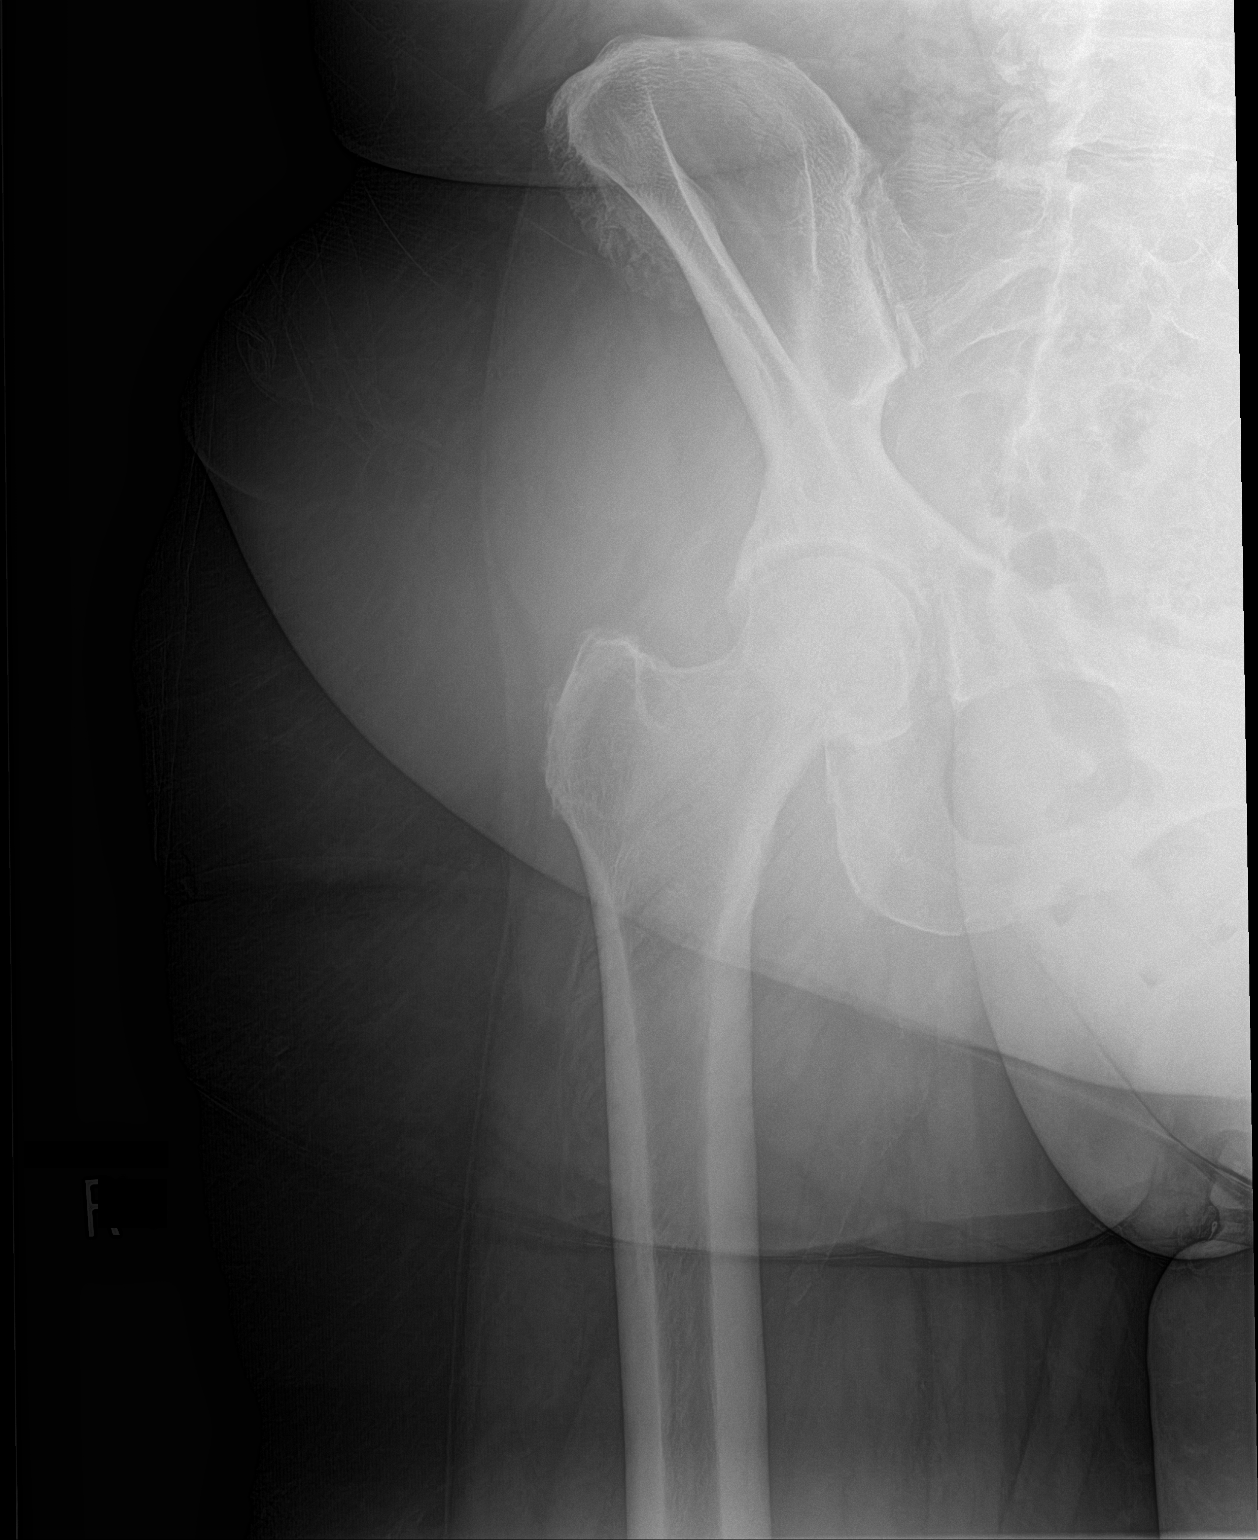

[hip lat]
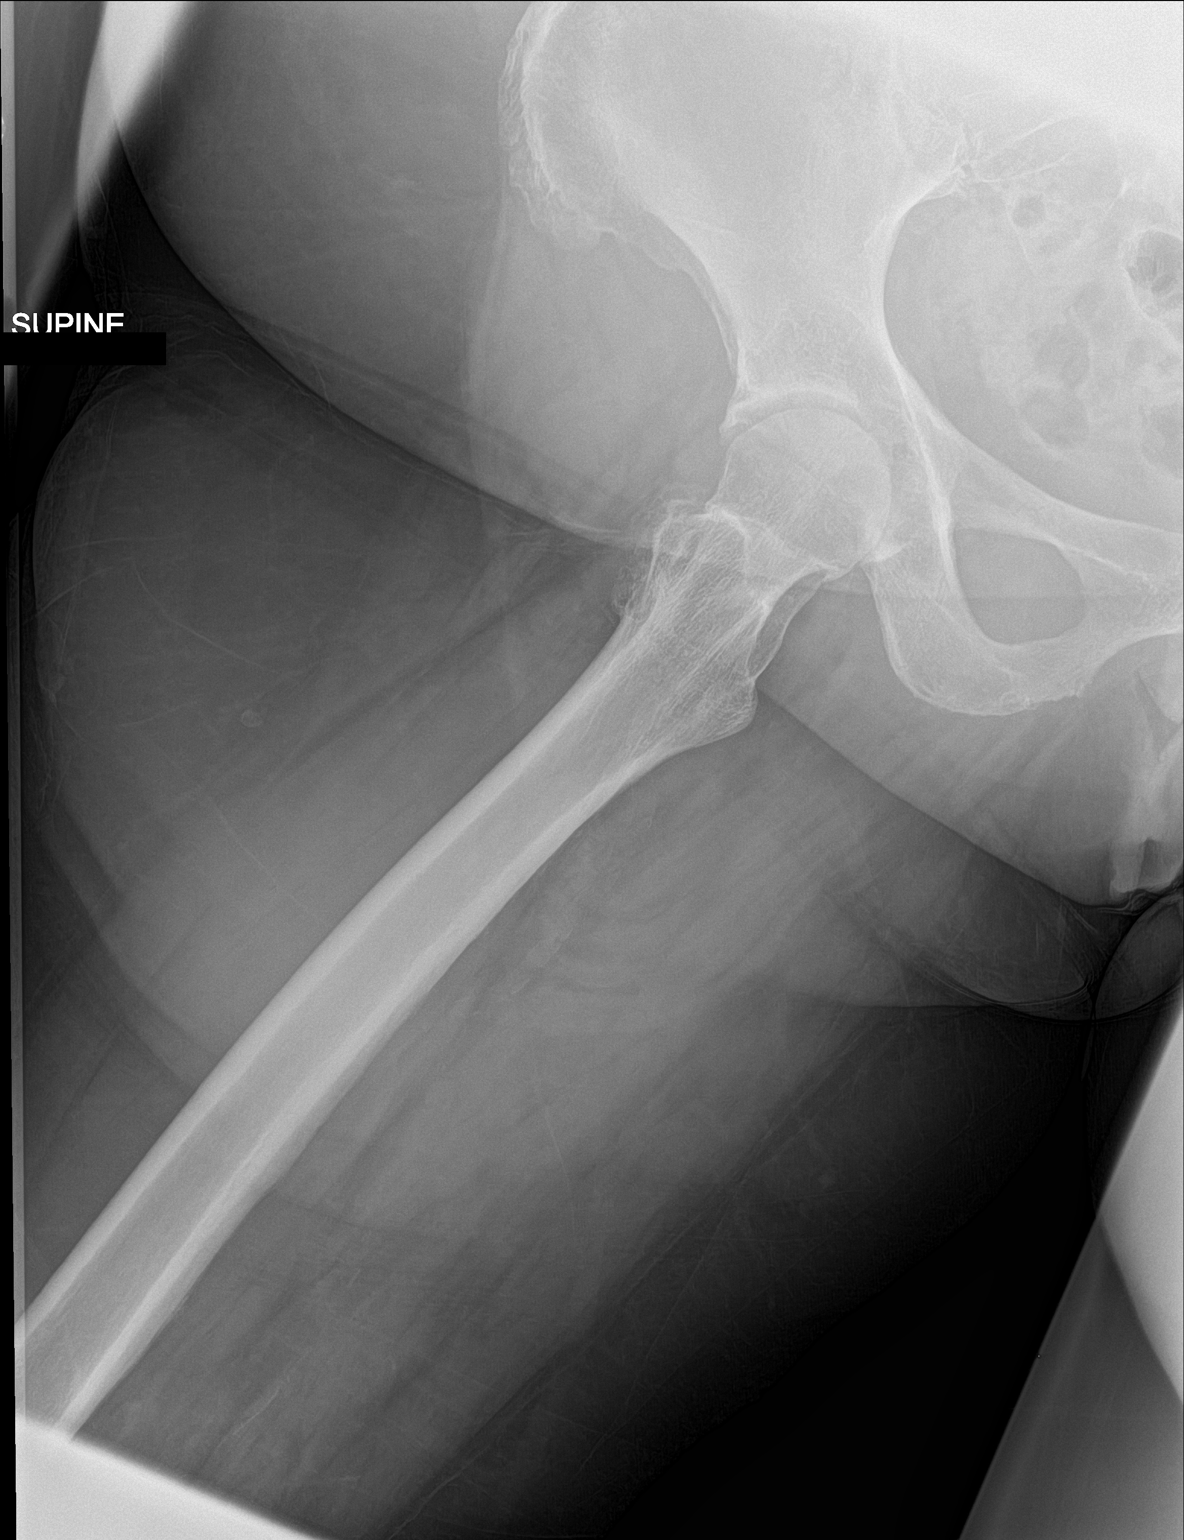

[3 of 3 positions shown; findings below may reference images not displayed]

FINDINGS: No acute bony abnormality identified. Degenerative changes lumbar
spine and both hips. Pelvic calcifications consistent phleboliths.
Peripheral vascular calcification.
IMPRESSION: 1. Degenerative changes lumbar spine and both hips. No acute
abnormality identified.

2. Peripheral vascular disease.

## 2016-11-24 ENCOUNTER — Other Ambulatory Visit: Payer: Self-pay | Admitting: Family Medicine

## 2016-11-24 NOTE — Telephone Encounter (Signed)
Refilled 09-27-16. Pt last seen 11-16-16. Please advise?

## 2016-11-24 NOTE — Telephone Encounter (Signed)
Sent to pharmacy 

## 2017-01-01 ENCOUNTER — Encounter: Payer: Self-pay | Admitting: Cardiovascular Disease

## 2017-01-01 ENCOUNTER — Ambulatory Visit (INDEPENDENT_AMBULATORY_CARE_PROVIDER_SITE_OTHER): Payer: BLUE CROSS/BLUE SHIELD | Admitting: Nurse Practitioner

## 2017-01-01 VITALS — BP 136/90 | HR 68 | Ht 63.5 in | Wt 243.8 lb

## 2017-01-01 DIAGNOSIS — I119 Hypertensive heart disease without heart failure: Secondary | ICD-10-CM

## 2017-01-01 DIAGNOSIS — I519 Heart disease, unspecified: Secondary | ICD-10-CM

## 2017-01-01 DIAGNOSIS — R0609 Other forms of dyspnea: Secondary | ICD-10-CM

## 2017-01-01 DIAGNOSIS — I10 Essential (primary) hypertension: Secondary | ICD-10-CM | POA: Diagnosis not present

## 2017-01-01 DIAGNOSIS — E782 Mixed hyperlipidemia: Secondary | ICD-10-CM

## 2017-01-01 DIAGNOSIS — I5189 Other ill-defined heart diseases: Secondary | ICD-10-CM

## 2017-01-01 MED ORDER — CHLORTHALIDONE 25 MG PO TABS: 12.5000 mg | ORAL_TABLET | Freq: Every day | ORAL | 3 refills | Status: DC

## 2017-01-01 NOTE — Progress Notes (Signed)
Cardiology Clinic Note   Patient Name: Bailey Brooks Date of Encounter: 01/01/2017  Primary Care Provider:  [name], MD Primary Cardiologist:  New  Patient Profile    66 y/o ? with a h/o HTN, obesity, lymphoma s/p chemo and stem cell tx, osteoarthritis, hypothyroidism, and morbid obesity, who presents for evaluation related to DOE and diastolic dysfunction.  Past Medical History    Past Medical History:  Diagnosis Date  . 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 Allergic rhinitis   . Arthritis   . Diastolic dysfunction    a. 11/2016 Echo: EF 60-65%, no rwma, mild LVH, Gr1 DD, triv MR, mildly dil LA, nl RV fxn.  . 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 Madelynn Done s/p RCHOP and methotrexate, c/b renal failure  . Diverticulosis   . Esophagitis   . H/O stem cell transplant ([place]) 06/2011   a. in setting of lymphoma.  . Hemorrhoids   . History of chemotherapy 22-Oct-2010   RHCOP/methotrexate-intrathecal  . History of stress test    a. 05/2010 Myoview: nl EF, no ischemia/infarct.  . Hypercholesterolemia   . Hypertension   . Hypothyroidism   . Morbid obesity ([place])   . Paget disease, extra mammary    vulva, s/p resection 2014, Dr. Sabra Heck  . Peripheral neuropathy ([place])   . Sleep apnea    a. she denies this.   Past Surgical History:  Procedure Laterality Date  . ABDOMINAL HYSTERECTOMY  1985   Hysterectomy-partial  . BREAST BIOPSY  2002   Neg - AT [facility]  . BURR HOLE W/ PLACEMENT OMMAYA RESERVOIR    . BURR HOLE W/ PLACEMENT OMMAYA RESERVOIR  2012  . COLONOSCOPY  04/2013  . ESOPHAGOGASTRODUODENOSCOPY  04/2013  . INSERTION CENTRAL VENOUS ACCESS DEVICE W/ SUBCUTANEOUS PORT  2011   Port a Cath: Right chest Double Lumen, 04-Nov-2010  . LIMBAL STEM CELL TRANSPLANT  2012  . LIVER BIOPSY     stage 4B large Bcell lymphoma  . SHOULDER ARTHROSCOPY Right 06/22/2015   Procedure: ARTHROSCOPY  SHOULDER, parital repair of rotator cuff, biceps tenodesis, decompression and debridement;  Surgeon: Corky Mull, MD;  Location: [facility] ORS;  Service: Orthopedics;  Laterality: Right;  . TONSILLECTOMY  1954  . VULVECTOMY    . VULVECTOMY PARTIAL N/A 09/29/2015   Procedure: VULVECTOMY PARTIAL;  Surgeon: Gillis Ends, MD;  Location: [facility] ORS;  Service: Gynecology;  Laterality: N/A;    Allergies  Allergies  Allergen Reactions  . Erythromycin Nausea And Vomiting  . Iodine Swelling    IV iodine  . Morphine And Related Nausea And Vomiting    Hallucinations   . Sulfa Antibiotics Other (See Comments)    Dizzy/Fainting  . Adhesive [Tape] Other (See Comments) and Rash    Including band-aide Including band-aide    History of Present Illness    66 y/o ? without prior cardiac hx.  She does have a h/o stress testing performed @ [facility] in 05/2010, which was negative.  Indication for testing was listed as abnl ECG.  She has a h/o HTN, dating back to ~ 2008 or so.  She was on medication for several years but in 2011, she was dx with lymphoma.  In the setting of treatment for lymphoma, including stem cell tx and chemo, she reports a steady decline in her blood pressure and she was eventually taken off of all of her  antihypertensives (was on three prior to lymphoma dx).  Other h/o includes OA, GERD, hypothyroidism, Paget dzs of the vulva s/p partial vulvectomy, and morbid obesity.  She thinks that she snores but doesn't think she's ever had a sleep study and isn't interested in one.  Over the past year, she has noted a reduction in exercise tolerance and DOE.  She has not had chest pain.  She says that following treatment for lymphoma between 2011 and 2012, she started exercising regularly in 2013.  In so doing, she lost over 40 lbs and continued to exercise over the subsequent 3 yrs.  In 2016, she fell while exercising and tore her right rotator cuff.  She underwent rotator cuff repair  in 06/2015.  She says that after that, she had a long recovery and really just never got back into exercising.  She also started eating out more frequently and over the past 18 mos has put on roughly 50 lbs.  In that setting, she now experiences dyspnea whenever she tries to increase her pace or walk up inclines/stairs.  She doesn't seem to have too much trouble with flat surfaces, so long as she walks @ her own pace.  Over the summer of 2017, her BP's were elevated and she was placed back on antihypertensive medication  amlodipine 2.5 mg daily.  She has noted mild ankle edema ever since.  BP has been fairly variable but often >130/85.  Due to DOE, she underwent echo in 11/2016. This showed nl EF with grade 1 diastolic dysfxn.  She was referred to cardiology for further eval.  As above, she has no prior h/o chest pain and further denies any h/o pnd, orthopnea, palpitations, dizziness, syncope, or early satiety.  Home Medications    Prior to Admission medications   Medication Sig Start Date End Date Taking? Authorizing Provider  amLODipine (NORVASC) 2.5 MG tablet Take 1 tablet (2.5 mg total) by mouth daily. 07/06/16  Yes Jackolyn Confer, MD  diclofenac (VOLTAREN) 75 MG EC tablet take 1 tablet by mouth twice a day 11/24/16  Yes Leone Haven, MD  lansoprazole (PREVACID) 30 MG capsule take 1 capsule by mouth once daily 07/10/16  Yes Coral Spikes, DO  levothyroxine (SYNTHROID, LEVOTHROID) 88 MCG tablet take 1 tablet by mouth every morning ON AN EMPTY STOMACH 11/13/16  Yes Jayce G Cook, DO  mometasone (ELOCON) 0.1 % cream APPLY TO AFFECTED AREA ON CHIN AND ELBOWS TWICE A DAY UNTIL CLEAR, THEN AS NEEDED FOR FLARES 03/13/16  Yes Historical Provider, MD  nystatin (MYCOSTATIN/NYSTOP) 100000 UNIT/GM POWD Apply topically as directed twice daily 03/02/16  Yes Jackolyn Confer, MD  valACYclovir (VALTREX) 500 MG tablet Take 1 tablet (500 mg total) by mouth 2 (two) times daily. 09/18/16  Yes Cammie Sickle, MD    chlorthalidone (HYGROTON) 25 MG tablet Take 0.5 tablets (12.5 mg total) by mouth daily. 01/01/17 04/01/17  Rogelia Mire, NP    Family History    Family History  Problem Relation Age of Onset  . Diabetes Mother     died @ 50 of MI.  Marland Kitchen Hypertension Mother   . Heart attack Mother   . Melanoma Father 49    died of complications r/t melanoma w/ lung mets.  . Kidney disease Sister     Kidney removed   . Prostate cancer Brother     Prostate - dx in 83's  . Heart disease Brother 72    reported MI @ age 50, ?  treated w/ TPA->no recurrent CAD, now in 70's.  Marland Kitchen Hearing loss Maternal Aunt   . Hearing loss Maternal Grandfather     Social History    Social History   Social History  . Marital status: Married    Spouse name: N/A  . Number of children: 2  . Years of education: N/A   Occupational History  . 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 Smoking status: Never Smoker  . Smokeless tobacco: Never Used  . Alcohol use No     Comment: former user-no current use  . Drug use: No  . Sexual activity: Yes    Partners: Male   Other Topics Concern  . Not on file   Social History Narrative   Lives in [place] with husband, has 2 grown sons.  No pets. 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     Review of Systems    General:  No chills, fever, night sweats or weight changes.  Cardiovascular:  No chest pain, +++ dyspnea on exertion, +++ mild ankle edema, no orthopnea, palpitations, paroxysmal nocturnal dyspnea. Dermatological: No rash, lesions/masses Respiratory: No cough, +++ dyspnea Urologic: No hematuria, dysuria Abdominal:   No nausea, vomiting, diarrhea, bright red blood per rectum, melena, or hematemesis Neurologic:  No visual changes, wkns, changes in mental status. All other systems reviewed and are otherwise negative except as noted above.  Physical Exam    VS:  BP 136/90 (BP Location: Left Arm, Patient Position: Sitting, Cuff Size:  Large)   Pulse 68   Ht 5' 3.5" (1.613 m)   Wt 243 lb 12 oz (110.6 kg)   BMI 42.50 kg/m  , BMI Body mass index is 42.5 kg/m. GEN: Well nourished, well developed, in no acute distress.  HEENT: normal.  Neck: Supple, no JVD, carotid bruits, or masses. Cardiac: RRR, no murmurs, rubs, or gallops. No clubbing, cyanosis, trace bilat ankle edema - L slightly > than right.  Radials/DP/PT 2+ and equal bilaterally.  Respiratory:  Respirations regular and unlabored, clear to auscultation bilaterally. GI: Soft, nontender, nondistended, BS + x 4. MS: no deformity or atrophy. Skin: warm and dry, no rash. Neuro:  Strength and sensation are intact. Psych: Normal affect.  Accessory Clinical Findings    ECG - RSR, 68, no acute st/t changes.  2D Echocardiogram 12.12.2017  Study Conclusions   - Left ventricle: The cavity size was normal. Wall thickness was   increased in a pattern of mild LVH. There was mild focal basal   hypertrophy of the septum. Systolic function was normal. The   estimated ejection fraction was in the range of 60% to 65%. Wall   motion was normal; there were no regional wall motion   abnormalities. Doppler parameters are consistent with abnormal   left ventricular relaxation (grade 1 diastolic dysfunction). - Mitral valve: Calcified annulus. There was trivial regurgitation. - Left atrium: The atrium was mildly dilated. - Right ventricle: The cavity size was normal. Systolic function   was normal. Lab Results  Component Value Date   WBC 6.8 11/06/2016   HGB 13.7 11/06/2016   HCT 41.1 11/06/2016   MCV 89.4 11/06/2016   PLT 214.0 11/06/2016   Lab Results  Component Value Date   CREATININE 0.93 11/06/2016   BUN 24 (H) 11/06/2016   NA 141 11/06/2016   K 3.7 11/06/2016   CL 105 11/06/2016   CO2 30 11/06/2016   Lab Results  Component Value Date   CHOL  247 (H) 03/02/2016   HDL 44.80 03/02/2016   LDLCALC 175 (H) 03/02/2016   LDLDIRECT 157.9 02/27/2013   TRIG 139.0  03/02/2016   CHOLHDL 6 03/02/2016    Lab Results  Component Value Date   TSH 3.27 11/06/2016    Lab Results  Component Value Date   HGBA1C 5.6 03/02/2016    Assessment & Plan   1.  Dyspnea on exertion/Diastolic Dysfxn:  Pt presents for evaluation of a nearly one year h/o progressive DOE and reduced exercise tolerance. She has gained about 50 lbs since 06/2015.  BPs have been elevated and she was placed on low dose amlodipine therapy over the summer of 2017.  She has had mild ankle edema but otw denies pnd, orthopnea, or early satiety.  She denies any h/o chest pain.  Echo in 11/2016 showed nl EF with grade 1 diastolic dysfxn.  No significant valvular abnormalities.  We had a long discussion today about diastolic chf mgmt and prevention.  We discussed the importance of daily weights, sodium restriction, medication compliance, and symptom reporting and she verbalizes understanding.  She does add salt to her egg in the morning and also eats out several x/wk.  BP is mildly elevated today @ 136/90.  I am going to check a bnp today.  I will also add low dose chlorthalidone 12.5 mg daily and arrange for f/u bmet in 1 wk.  If she tolerates this well and bp is stable, we may be able to titrate further and d/c amlodipine as this has caused mild edema.  I will plan to see her back in 3-4 wks or sooner if necessary.  I did offer to arrange for sleep study since she reports a h/o snoring, but she wishes to defer for now.  We also discussed the possible role of stress testing to r/o ischemia, as dyspnea may be an anginal equivalent.  She had a neg MV in 2011.  If symptoms do not improve despite adequate bp control on diuretic rx, we can readdress potential role of functional testing @ f/u.  2.  Hypertensive Heart Disease:  BP elevated today @ 136/90.  She says that sometimes pressure is in the 170's @ home.  As above, adding chlorthalidone 12.5 mg today.  I've asked her to follow her bp @ home.   I will bring her  back for a bmet next week.  3.  Morbid Obesity:  Deconditioning and 50 lbs wt gain over past 18 mos very likely playing a role in DOE and also diastolic dysfxn.  Activity has been limited by bilat knee pain 2/2 OA.  We talked about calorie restriction and appropriate caloric goals.  H2O aerobics may be a low impact option that she will look into.  4.  HL:  Lipid panel in 02/2016  TC 247, TG 139, HDL 44.8, LDL 175.  Based on this, 10 yr risk of cardiac event is 18.9%.  That being the case, initiation of statin therapy would be appropriate.  We will discuss @ f/u.  5.  Hypothyroidism:  TSH nl 12/4.  On levothyroxine.  6.  Dispo:  F/u bnp today.  BMET in 1 wk.  F/u in office in 1 mo or sooner if necessary.  Murray Hodgkins, NP 01/01/2017, 3:23 PM

## 2017-01-01 NOTE — Patient Instructions (Signed)
Medication Instructions:  Your physician has recommended you make the following change in your medication:  START taking chlorthalidone 12.5mg  once daily   Labwork: BMET in one week BNP today  Testing/Procedures: none  Follow-Up: Your physician recommends that you schedule a follow-up appointment in: one month with Ignacia Bayley, NP   Any Other Special Instructions Will Be Listed Below (If Applicable).     If you need a refill on your cardiac medications before your next appointment, please call your pharmacy.

## 2017-01-03 LAB — BRAIN NATRIURETIC PEPTIDE: BNP: 54.6 pg/mL (ref 0.0–100.0)

## 2017-01-11 ENCOUNTER — Other Ambulatory Visit
Admission: RE | Admit: 2017-01-11 | Discharge: 2017-01-11 | Disposition: A | Discharge status: Home / Self Care | Payer: BLUE CROSS/BLUE SHIELD | Source: Ambulatory Visit | Attending: Nurse Practitioner | Admitting: Nurse Practitioner

## 2017-01-11 ENCOUNTER — Other Ambulatory Visit: Payer: Self-pay | Admitting: Family Medicine

## 2017-01-11 DIAGNOSIS — I119 Hypertensive heart disease without heart failure: Secondary | ICD-10-CM

## 2017-01-11 LAB — BASIC METABOLIC PANEL WITH GFR
Anion gap: 7 (ref 5–15)
BUN: 23 mg/dL — ABNORMAL HIGH (ref 6–20)
CO2: 31 mmol/L (ref 22–32)
Calcium: 9.2 mg/dL (ref 8.9–10.3)
Chloride: 101 mmol/L (ref 101–111)
Creatinine, Ser: 1.05 mg/dL — ABNORMAL HIGH (ref 0.44–1.00)
GFR calc Af Amer: >60 mL/min
GFR calc non Af Amer: 54 mL/min — ABNORMAL LOW
Glucose, Bld: 100 mg/dL — ABNORMAL HIGH (ref 65–99)
Potassium: 3.6 mmol/L (ref 3.5–5.1)
Sodium: 139 mmol/L (ref 135–145)

## 2017-01-12 NOTE — Telephone Encounter (Signed)
Dr.refilled 07/10/16. Pt last seen 11/06/16. Please advise?

## 2017-01-19 ENCOUNTER — Other Ambulatory Visit: Payer: Self-pay | Admitting: Internal Medicine

## 2017-01-29 ENCOUNTER — Encounter: Payer: Self-pay | Admitting: Cardiovascular Disease

## 2017-01-29 ENCOUNTER — Other Ambulatory Visit: Payer: Self-pay | Admitting: Family Medicine

## 2017-01-29 ENCOUNTER — Ambulatory Visit (INDEPENDENT_AMBULATORY_CARE_PROVIDER_SITE_OTHER): Payer: BLUE CROSS/BLUE SHIELD | Admitting: Cardiovascular Disease

## 2017-01-29 VITALS — BP 146/92 | HR 85 | Ht 63.5 in | Wt 243.8 lb

## 2017-01-29 DIAGNOSIS — I10 Essential (primary) hypertension: Secondary | ICD-10-CM | POA: Diagnosis not present

## 2017-01-29 DIAGNOSIS — I119 Hypertensive heart disease without heart failure: Secondary | ICD-10-CM

## 2017-01-29 NOTE — Telephone Encounter (Signed)
Last OV 11/06/16 last filled 11/24/16 60 [id number]

## 2017-01-29 NOTE — Patient Instructions (Signed)
Medication Instructions:  Your physician has recommended you make the following change in your medication:  STOP taking diclofenac   Labwork: none  Testing/Procedures: none  Follow-Up: Your physician recommends that you schedule a follow-up appointment as needed with Dr. Fletcher Anon.    Any Other Special Instructions Will Be Listed Below (If Applicable).     If you need a refill on your cardiac medications before your next appointment, please call your pharmacy.

## 2017-02-01 NOTE — Progress Notes (Signed)
Cardiology Office Note   Date:  02/01/2017   ID:  Bailey Brooks, DOB 07/11/1951, MRN [id number]  PCP:  [name], MD  Cardiologist:   Kathlyn Sacramento, MD   Chief Complaint  Patient presents with  . other    1 month f/u echo. Meds reviewed verbally with pt.      History of Present Illness: Bailey Brooks is a 66 y.o. female who presents for A follow-up visit regarding dyspnea and essential hypertension. She has no prior cardiac history. She has known history of hypertension, obesity, lymphoma status post chemotherapy, and osteoarthritis. She was seen by Gerald Stabs in January for exertional dyspnea and hypertension. Echocardiogram in December showed normal LV systolic function and grade 1 diastolic dysfunction. Some of her symptoms were felt to be due to physical deconditioning and uncontrolled hypertension. She was started on small dose chlorthalidone. A sleep study was suggested. She reports some improvement in symptoms. She denies any chest pain. BNP was normal.   Past Medical History:  Diagnosis Date  . 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 Allergic rhinitis   . Arthritis   . Diastolic dysfunction    a. 11/2016 Echo: EF 60-65%, no rwma, mild LVH, Gr1 DD, triv MR, mildly dil LA, nl RV fxn.  . 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 Madelynn Done s/p RCHOP and methotrexate, c/b renal failure  . Diverticulosis   . Esophagitis   . H/O stem cell transplant ([facility]) 06/2011   a. in setting of lymphoma.  . Hemorrhoids   . History of chemotherapy 22-Oct-2010   RHCOP/methotrexate-intrathecal  . History of stress test    a. 05/2010 Myoview: nl EF, no ischemia/infarct.  . Hypercholesterolemia   . Hypertension   . Hypothyroidism   . Morbid obesity ([place])   . Paget disease, extra mammary    vulva, s/p resection 2014, Dr. Sabra Heck  . Peripheral neuropathy ([place])   . Sleep apnea    a. she denies this.     Past Surgical History:  Procedure Laterality Date  . ABDOMINAL HYSTERECTOMY  1985   Hysterectomy-partial  . BREAST BIOPSY  2002   Neg - AT [facility]  . BURR HOLE W/ PLACEMENT OMMAYA RESERVOIR    . BURR HOLE W/ PLACEMENT OMMAYA RESERVOIR  2012  . COLONOSCOPY  04/2013  . ESOPHAGOGASTRODUODENOSCOPY  04/2013  . INSERTION CENTRAL VENOUS ACCESS DEVICE W/ SUBCUTANEOUS PORT  2011   Port a Cath: Right chest Double Lumen, 04-Nov-2010  . LIMBAL STEM CELL TRANSPLANT  2012  . LIVER BIOPSY     stage 4B large Bcell lymphoma  . SHOULDER ARTHROSCOPY Right 06/22/2015   Procedure: ARTHROSCOPY SHOULDER, parital repair of rotator cuff, biceps tenodesis, decompression and debridement;  Surgeon: Corky Mull, MD;  Location: [facility] ORS;  Service: Orthopedics;  Laterality: Right;  . TONSILLECTOMY  1954  . VULVECTOMY    . VULVECTOMY PARTIAL N/A 09/29/2015   Procedure: VULVECTOMY PARTIAL;  Surgeon: Gillis Ends, MD;  Location: [facility] ORS;  Service: Gynecology;  Laterality: N/A;     Current Outpatient Prescriptions  Medication Sig Dispense Refill  . amLODipine (NORVASC) 2.5 MG tablet Take 1 tablet (2.5 mg total) by mouth daily. 90 tablet 3  . chlorthalidone (HYGROTON) 25 MG tablet Take 0.5 tablets (12.5 mg total) by mouth daily. 30 tablet 3  . lansoprazole (PREVACID) 30 MG capsule take 1 capsule by mouth once daily 30 capsule  2  . levothyroxine (SYNTHROID, LEVOTHROID) 88 MCG tablet take 1 tablet by mouth every morning ON AN EMPTY STOMACH 90 tablet 3  . mometasone (ELOCON) 0.1 % cream APPLY TO AFFECTED AREA ON CHIN AND ELBOWS TWICE A DAY UNTIL CLEAR, THEN AS NEEDED FOR FLARES  0  . nystatin (MYCOSTATIN/NYSTOP) 100000 UNIT/GM POWD Apply topically as directed twice daily 60 g 3  . valACYclovir (VALTREX) 500 MG tablet take 1 tablet by mouth twice a day 60 tablet 3  . diclofenac (VOLTAREN) 75 MG EC tablet take 1 tablet by mouth twice a day 60 tablet 0   No current facility-administered medications for this  visit.     Allergies:   Erythromycin; Iodine; Morphine and related; Sulfa antibiotics; and Adhesive [tape]    Social History:  The patient  reports that she has never smoked. She has never used smokeless tobacco. She reports that she does not drink alcohol or use drugs.   Family History:  The patient's family history includes Diabetes in her mother; Hearing loss in her maternal aunt and maternal grandfather; Heart attack in her mother; Heart disease (age of onset: 21) in her brother; Hypertension in her mother; Kidney disease in her sister; Melanoma (age of onset: 59) in her father; Prostate cancer in her brother.    ROS:  Please see the history of present illness.   Otherwise, review of systems are positive for none.   All other systems are reviewed and negative.    PHYSICAL EXAM: VS:  BP (!) 146/92 (BP Location: Left Arm, Patient Position: Sitting, Cuff Size: Large)   Pulse 85   Ht 5' 3.5" (1.613 m)   Wt 243 lb 12 oz (110.6 kg)   BMI 42.50 kg/m  , BMI Body mass index is 42.5 kg/m. GEN: Well nourished, well developed, in no acute distress  HEENT: normal  Neck: no JVD, carotid bruits, or masses Cardiac: RRR; no murmurs, rubs, or gallops,no edema  Respiratory:  clear to auscultation bilaterally, normal work of breathing GI: soft, nontender, nondistended, + BS MS: no deformity or atrophy  Skin: warm and dry, no rash Neuro:  Strength and sensation are intact Psych: euthymic mood, full affect   EKG:  EKG is not ordered today.   Recent Labs: 11/06/2016: ALT 20; Hemoglobin 13.7; Magnesium 2.1; Platelets 214.0; TSH 3.27 01/01/2017: BNP 54.6 01/11/2017: BUN 23; Creatinine, Ser 1.05; Potassium 3.6; Sodium 139    Lipid Panel    Component Value Date/Time   CHOL 247 (H) 03/02/2016 0816   TRIG 139.0 03/02/2016 0816   HDL 44.80 03/02/2016 0816   CHOLHDL 6 03/02/2016 0816   VLDL 27.8 03/02/2016 0816   LDLCALC 175 (H) 03/02/2016 0816   LDLDIRECT 157.9 02/27/2013 1123      Wt  Readings from Last 3 Encounters:  01/29/17 243 lb 12 oz (110.6 kg)  01/01/17 243 lb 12 oz (110.6 kg)  11/15/16 242 lb 15.2 oz (110.2 kg)       PAD Screen 01/01/2017  Previous PAD dx? No  Previous surgical procedure? No  Pain with walking? No  Feet/toe relief with dangling? No  Painful, non-healing ulcers? No  Extremities discolored? No      ASSESSMENT AND PLAN:  1.  Exertional dyspnea: Likely multifactorial due to mild degree of diastolic dysfunction and physical deconditioning. No convincing symptoms of angina. Continue treatment of risk factors.  2. Essential hypertension: Still not optimally controlled. She has been taking diclofenac twice daily which might be contributing to uncontrolled hypertension. She  is not having significant pain and I have suggested that she stop this medication and uses acetaminophen as needed. If blood pressure continues to be elevated, amlodipine can be increased.   Disposition:   FU with me as needed.   Signed,  Kathlyn Sacramento, MD  02/01/2017 4:51 PM    [facility]

## 2017-02-06 ENCOUNTER — Ambulatory Visit (INDEPENDENT_AMBULATORY_CARE_PROVIDER_SITE_OTHER): Payer: BLUE CROSS/BLUE SHIELD | Admitting: Family Medicine

## 2017-02-06 ENCOUNTER — Encounter: Payer: Self-pay | Admitting: Family Medicine

## 2017-02-06 DIAGNOSIS — R0602 Shortness of breath: Secondary | ICD-10-CM | POA: Diagnosis not present

## 2017-02-06 DIAGNOSIS — I10 Essential (primary) hypertension: Secondary | ICD-10-CM

## 2017-02-06 DIAGNOSIS — E039 Hypothyroidism, unspecified: Secondary | ICD-10-CM | POA: Diagnosis not present

## 2017-02-06 DIAGNOSIS — M25551 Pain in right hip: Secondary | ICD-10-CM | POA: Diagnosis not present

## 2017-02-06 NOTE — Progress Notes (Signed)
  [name], MD Phone: [phone]  Bailey Brooks is a 66 y.o. female who presents today for f/u.  HYPERTENSION  Disease Monitoring  Home BP Monitoring not checking Chest pain- no    Dyspnea- yes, see below Medications  Compliance-  Taking amlodipine, chlorthalidone.  Edema- no  HYPOTHYROIDISM Disease Monitoring Weight changes: no  Skin Changes: no Heat/Cold intolerance: some cold intolerance  Medication Monitoring Compliance:  Taking synthroid each morning right after getting up   Last TSH:   Lab Results  Component Value Date   TSH 3.27 11/06/2016   Dyspnea on exertion has gotten quite a bit better. Only bothers her now when she tries to use her knees and hips with going up stairs. No chest pain with this. Has been evaluated by cardiology and has been felt to be related to grade 1 diastolic dysfunction in combination with deconditioning.  Osteoarthritis: Patient has most of her issues with her right hip. Has come off of diclofenac at the urging of cardiology given her elevated blood pressures. Has been doing Tylenol arthritis with some good benefit. Typically only bothers her with inclines.  PMH: nonsmoker.   ROS see history of present illness  Objective  Physical Exam Vitals:   02/06/17 0815  BP: 132/88  Pulse: 73  Temp: 98.3 F (36.8 C)    BP Readings from Last 3 Encounters:  02/06/17 132/88  01/29/17 (!) 146/92  01/01/17 136/90   Wt Readings from Last 3 Encounters:  02/06/17 243 lb 9.6 oz (110.5 kg)  01/29/17 243 lb 12 oz (110.6 kg)  01/01/17 243 lb 12 oz (110.6 kg)    Physical Exam  Constitutional: No distress.  HENT:  Head: Normocephalic and atraumatic.  Cardiovascular: Normal rate, regular rhythm and normal heart sounds.   Pulmonary/Chest: Effort normal and breath sounds normal.  Musculoskeletal: She exhibits no edema.  Neurological: She is alert. Gait normal.  Skin: Skin is warm and dry. She is not diaphoretic.     Assessment/Plan:  Please see individual problem list.  Essential hypertension Under better control. I have encouraged her to check her blood pressure at home to see if it is maintaining under good control. She'll continue her current doses of amlodipine and chlorthalidone. We'll see her back in 3 months.  Hypothyroidism Most recent TSH normal. Continue Synthroid.  Exertional shortness of breath Suspect related to deconditioning and grade 1 diastolic dysfunction. Has improved with improved blood pressure control. She'll continue to monitor and if worsens let us know.  Right hip pain Patient with osteoarthritis. Has come off diclofenac and has had good benefit with Tylenol arthritis. Advised that she could take this 3 times a day if needed. She'll continue to monitor. She was given exercises to complete as well to help strengthen her hips.   [name], MD [place]

## 2017-02-06 NOTE — Progress Notes (Signed)
Pre visit review using our clinic review tool, if applicable. No additional management support is needed unless otherwise documented below in the visit note. 

## 2017-02-06 NOTE — Assessment & Plan Note (Signed)
Suspect related to deconditioning and grade 1 diastolic dysfunction. Has improved with improved blood pressure control. She'll continue to monitor and if worsens let us know.

## 2017-02-06 NOTE — Assessment & Plan Note (Signed)
Patient with osteoarthritis. Has come off diclofenac and has had good benefit with Tylenol arthritis. Advised that she could take this 3 times a day if needed. She'll continue to monitor. She was given exercises to complete as well to help strengthen her hips.

## 2017-02-06 NOTE — Patient Instructions (Addendum)
Nice to see you. Please continue your blood pressure medications. Please continue thyroid medication. You can continue Tylenol arthritis. You can take this 3 times a day. If your breathing gets worse please let us know.   Hip Exercises Ask your health care provider which exercises are safe for you. Do exercises exactly as told by your health care provider and adjust them as directed. It is normal to feel mild stretching, pulling, tightness, or discomfort as you do these exercises, but you should stop right away if you feel sudden pain or your pain gets worse.Do not begin these exercises until told by your health care provider. STRETCHING AND RANGE OF MOTION EXERCISES  These exercises warm up your muscles and joints and improve the movement and flexibility of your hip. These exercises also help to relieve pain, numbness, and tingling. Exercise A: Hamstrings, Supine   1. Lie on your back. 2. Loop a belt or towel over the ball of your left / rightfoot. The ball of your foot is on the walking surface, right under your toes. 3. Straighten your left / rightknee and slowly pull on the belt to raise your leg.  Do not let your left / right knee bend while you do this.  Keep your other leg flat on the floor.  Raise the left / right leg until you feel a gentle stretch behind your left / right knee or thigh. 4. Hold this position for __________ seconds. 5. Slowly return your leg to the starting position. Repeat __________ times. Complete this stretch __________ times a day. Exercise B: Hip Rotators   1. Lie on your back on a firm surface. 2. Hold your left / right knee with your left / right hand. Hold your ankle with your other hand. 3. Gently pull your left / right knee and rotate your lower leg toward your other shoulder.  Pull until you feel a stretch in your buttocks.  Keep your hips and shoulders firmly planted while you do this stretch. 4. Hold this position for __________  seconds. Repeat __________ times. Complete this stretch __________ times a day. Exercise C: V-Sit (Hamstrings and Adductors)   1. Sit on the floor with your legs extended in a large "V" shape. Keep your knees straight during this exercise. 2. Start with your head and chest upright, then bend at your waist to reach for your left foot (position A). You should feel a stretch in your right inner thigh. 3. Hold this position for __________ seconds. Then slowly return to the upright position. 4. Bend at your waist to reach forward (position B). You should feel a stretch behind both of your thighs and knees. 5. Hold this position for __________ seconds. Then slowly return to the upright position. 6. Bend at your waist to reach for your right foot (position C). You should feel a stretch in your left inner thigh. 7. Hold this position for __________ seconds. Then slowly return to the upright position. Repeat __________ times. Complete this stretch __________ times a day. Exercise D: Lunge (Hip Flexors)   1. Place your left / right knee on the floor and bend your other knee so that is directly over your ankle. You should be half-kneeling. 2. Keep good posture with your head over your shoulders. 3. Tighten your buttocks to point your tailbone downward. This helps your back to keep from arching too much. 4. You should feel a gentle stretch in the front of your left / right thigh and hip. If you  do not feel any resistance, slightly slide your other foot forward and then slowly lunge forward so your knee once again lines up over your ankle. 5. Make sure your tailbone continues to point downward. 6. Hold this position for __________ seconds. Repeat __________ times. Complete this stretch __________ times a day. STRENGTHENING EXERCISES  These exercises build strength and endurance in your hip. Endurance is the ability to use your muscles for a long time, even after they get tired. Exercise E: Bridge (Hip  Extensors)   1. Lie on your back on a firm surface with your knees bent and your feet flat on the floor. 2. Tighten your buttocks muscles and lift your bottom off the floor until the trunk of your body is level with your thighs.  Do not arch your back.  You should feel the muscles working in your buttocks and the back of your thighs. If you do not feel these muscles, slide your feet 1-2 inches (2.5-5 cm) farther away from your buttocks. 3. Hold this position for __________ seconds. 4. Slowly lower your hips to the starting position. 5. Let your muscles relax completely between repetitions. 6. If this exercise is too easy, try doing it with your arms crossed over your chest. Repeat __________ times. Complete this exercise __________ times a day. Exercise F: Straight Leg Raises - Hip Abductors   1. Lie on your side with your left / right leg in the top position. Lie so your head, shoulder, knee, and hip line up with each other. You may bend your bottom knee to help you balance. 2. Roll your hips slightly forward, so your hips are stacked directly over each other and your left / right knee is facing forward. 3. Leading with your heel, lift your top leg 4-6 inches (10-15 cm). You should feel the muscles in your outer hip lifting.  Do not let your foot drift forward.  Do not let your knee roll toward the ceiling. 4. Hold this position for __________ seconds. 5. Slowly return to the starting position. 6. Let your muscles relax completely between repetitions. Repeat __________ times. Complete this exercise __________ times a day. Exercise G: Straight Leg Raises - Hip Adductors   1. Lie on your side with your left / right leg in the bottom position. Lie so your head, shoulder, knee, and hip line up. You may place your upper foot in front to help you balance. 2. Roll your hips slightly forward, so your hips are stacked directly over each other and your left / right knee is facing  forward. 3. Tense the muscles in your inner thigh and lift your bottom leg 4-6 inches (10-15 cm). 4. Hold this position for __________ seconds. 5. Slowly return to the starting position. 6. Let your muscles relax completely between repetitions. Repeat __________ times. Complete this exercise __________ times a day. Exercise H: Straight Leg Raises - Quadriceps   1. Lie on your back with your left / right leg extended and your other knee bent. 2. Tense the muscles in the front of your left / right thigh. When you do this, you should see your kneecap slide up or see increased dimpling just above your knee. 3. Tighten these muscles even more and raise your leg 4-6 inches (10-15 cm) off the floor. 4. Hold this position for __________ seconds. 5. Keep these muscles tense as you lower your leg. 6. Relax the muscles slowly and completely between repetitions. Repeat __________ times. Complete this exercise __________ times a  day. Exercise I: Hip Abductors, Standing  1. Tie one end of a rubber exercise band or tubing to a secure surface, such as a table or pole. 2. Loop the other end of the band or tubing around your left / right ankle. 3. Keeping your ankle with the band or tubing directly opposite of the secured end, step away until there is tension in the tubing or band. Hold onto a chair as needed for balance. 4. Lift your left / right leg out to your side. While you do this:  Keep your back upright.  Keep your shoulders over your hips.  Keep your toes pointing forward.  Make sure to use your hip muscles to lift your leg. Do not "throw" your leg or tip your body to lift your leg. 5. Hold this position for __________ seconds. 6. Slowly return to the starting position. Repeat __________ times. Complete this exercise __________ times a day. Exercise J: Squats (Quadriceps)  1. Stand in a door frame so your feet and knees are in line with the frame. You may place your hands on the frame for  balance. 2. Slowly bend your knees and lower your hips like you are going to sit in a chair.  Keep your lower legs in a straight-up-and-down position.  Do not let your hips go lower than your knees.  Do not bend your knees lower than told by your health care provider.  If your hip pain increases, do not bend as low. 3. Hold this position for ___________ seconds. 4. Slowly push with your legs to return to standing. Do not use your hands to pull yourself to standing. Repeat __________ times. Complete this exercise __________ times a day. This information is not intended to replace advice given to you by your health care provider. Make sure you discuss any questions you have with your health care provider. Document Released: 12/08/2005 Document Revised: 08/14/2016 Document Reviewed: 11/15/2015 Elsevier Interactive Patient Education  2017 [employer].

## 2017-02-06 NOTE — Assessment & Plan Note (Signed)
Under better control. I have encouraged her to check her blood pressure at home to see if it is maintaining under good control. She'll continue her current doses of amlodipine and chlorthalidone. We'll see her back in 3 months.

## 2017-02-06 NOTE — Assessment & Plan Note (Signed)
Most recent TSH normal. Continue Synthroid. 

## 2017-02-10 ENCOUNTER — Encounter (HOSPITAL_COMMUNITY): Payer: Self-pay | Admitting: Nurse Practitioner

## 2017-02-10 ENCOUNTER — Observation Stay (HOSPITAL_COMMUNITY)
Admission: EM | Admit: 2017-02-10 | Discharge: 2017-02-11 | Disposition: A | Discharge status: Home / Self Care | Payer: BLUE CROSS/BLUE SHIELD | Attending: Family Medicine | Admitting: Family Medicine

## 2017-02-10 ENCOUNTER — Emergency Department (HOSPITAL_COMMUNITY): Payer: BLUE CROSS/BLUE SHIELD

## 2017-02-10 DIAGNOSIS — Z885 Allergy status to narcotic agent status: Secondary | ICD-10-CM | POA: Insufficient documentation

## 2017-02-10 DIAGNOSIS — Z882 Allergy status to sulfonamides status: Secondary | ICD-10-CM | POA: Insufficient documentation

## 2017-02-10 DIAGNOSIS — I110 Hypertensive heart disease with heart failure: Secondary | ICD-10-CM | POA: Insufficient documentation

## 2017-02-10 DIAGNOSIS — C8590 Non-Hodgkin lymphoma, unspecified, unspecified site: Secondary | ICD-10-CM | POA: Diagnosis not present

## 2017-02-10 DIAGNOSIS — E7800 Pure hypercholesterolemia, unspecified: Secondary | ICD-10-CM | POA: Diagnosis not present

## 2017-02-10 DIAGNOSIS — E876 Hypokalemia: Secondary | ICD-10-CM | POA: Diagnosis not present

## 2017-02-10 DIAGNOSIS — F4323 Adjustment disorder with mixed anxiety and depressed mood: Secondary | ICD-10-CM | POA: Diagnosis present

## 2017-02-10 DIAGNOSIS — I5032 Chronic diastolic (congestive) heart failure: Secondary | ICD-10-CM | POA: Insufficient documentation

## 2017-02-10 DIAGNOSIS — Z9221 Personal history of antineoplastic chemotherapy: Secondary | ICD-10-CM | POA: Insufficient documentation

## 2017-02-10 DIAGNOSIS — Z79899 Other long term (current) drug therapy: Secondary | ICD-10-CM | POA: Diagnosis not present

## 2017-02-10 DIAGNOSIS — E039 Hypothyroidism, unspecified: Secondary | ICD-10-CM

## 2017-02-10 DIAGNOSIS — E785 Hyperlipidemia, unspecified: Secondary | ICD-10-CM | POA: Diagnosis not present

## 2017-02-10 DIAGNOSIS — G459 Transient cerebral ischemic attack, unspecified: Secondary | ICD-10-CM | POA: Diagnosis not present

## 2017-02-10 DIAGNOSIS — Z9484 Stem cells transplant status: Secondary | ICD-10-CM | POA: Diagnosis not present

## 2017-02-10 DIAGNOSIS — I10 Essential (primary) hypertension: Secondary | ICD-10-CM | POA: Diagnosis present

## 2017-02-10 DIAGNOSIS — J8482 Adult pulmonary Langerhans cell histiocytosis: Secondary | ICD-10-CM | POA: Diagnosis not present

## 2017-02-10 DIAGNOSIS — R4781 Slurred speech: Secondary | ICD-10-CM | POA: Diagnosis not present

## 2017-02-10 DIAGNOSIS — Z8673 Personal history of transient ischemic attack (TIA), and cerebral infarction without residual deficits: Secondary | ICD-10-CM | POA: Diagnosis present

## 2017-02-10 DIAGNOSIS — R479 Unspecified speech disturbances: Secondary | ICD-10-CM | POA: Diagnosis present

## 2017-02-10 DIAGNOSIS — Z6841 Body Mass Index (BMI) 40.0 and over, adult: Secondary | ICD-10-CM | POA: Insufficient documentation

## 2017-02-10 HISTORY — DX: Heart failure, unspecified: I50.9

## 2017-02-10 HISTORY — DX: Gastro-esophageal reflux disease without esophagitis: K21.9

## 2017-02-10 HISTORY — DX: Personal history of transient ischemic attack (TIA), and cerebral infarction without residual deficits: Z86.73

## 2017-02-10 HISTORY — DX: Hypokalemia: E87.6

## 2017-02-10 LAB — CBC
HEMATOCRIT: 43.3 % (ref 36.0–46.0)
HEMOGLOBIN: 14.1 g/dL (ref 12.0–15.0)
MCH: 29.3 pg (ref 26.0–34.0)
MCHC: 32.6 g/dL (ref 30.0–36.0)
MCV: 89.8 fL (ref 78.0–100.0)
Platelets: 213 10*3/uL (ref 150–400)
RBC: 4.82 MIL/uL (ref 3.87–5.11)
RDW: 14.1 % (ref 11.5–15.5)
WBC: 5.8 10*3/uL (ref 4.0–10.5)

## 2017-02-10 LAB — DIFFERENTIAL
BASOS PCT: 0 %
Basophils Absolute: 0 10*3/uL (ref 0.0–0.1)
EOS PCT: 1 %
Eosinophils Absolute: 0.1 10*3/uL (ref 0.0–0.7)
LYMPHS ABS: 2.2 10*3/uL (ref 0.7–4.0)
Lymphocytes Relative: 37 %
MONO ABS: 0.5 10*3/uL (ref 0.1–1.0)
MONOS PCT: 8 %
Neutro Abs: 3.1 10*3/uL (ref 1.7–7.7)
Neutrophils Relative %: 54 %

## 2017-02-10 LAB — COMPREHENSIVE METABOLIC PANEL
ALT: 15 U/L (ref 14–54)
ANION GAP: 9 (ref 5–15)
AST: 20 U/L (ref 15–41)
Albumin: 3.3 g/dL — ABNORMAL LOW (ref 3.5–5.0)
Alkaline Phosphatase: 85 U/L (ref 38–126)
BILIRUBIN TOTAL: 0.5 mg/dL (ref 0.3–1.2)
BUN: 17 mg/dL (ref 6–20)
CO2: 31 mmol/L (ref 22–32)
Calcium: 9.3 mg/dL (ref 8.9–10.3)
Chloride: 99 mmol/L — ABNORMAL LOW (ref 101–111)
Creatinine, Ser: 0.96 mg/dL (ref 0.44–1.00)
GFR, EST NON AFRICAN AMERICAN: 60 mL/min — AB (ref >60)
Glucose, Bld: 102 mg/dL — ABNORMAL HIGH (ref 65–99)
POTASSIUM: 3.3 mmol/L — AB (ref 3.5–5.1)
Sodium: 139 mmol/L (ref 135–145)
TOTAL PROTEIN: 7.3 g/dL (ref 6.5–8.1)

## 2017-02-10 LAB — URINALYSIS, ROUTINE W REFLEX MICROSCOPIC
Bacteria, UA: NONE SEEN
Bilirubin Urine: NEGATIVE
GLUCOSE, UA: NEGATIVE mg/dL
HGB URINE DIPSTICK: NEGATIVE
KETONES UR: NEGATIVE mg/dL
Nitrite: NEGATIVE
PROTEIN: NEGATIVE mg/dL
Specific Gravity, Urine: 1.015 (ref 1.005–1.030)
pH: 7 (ref 5.0–8.0)

## 2017-02-10 LAB — ETHANOL

## 2017-02-10 LAB — I-STAT TROPONIN, ED: TROPONIN I, POC: 0 ng/mL (ref 0.00–0.08)

## 2017-02-10 LAB — TSH: TSH: 1.61 u[IU]/mL (ref 0.350–4.500)

## 2017-02-10 LAB — CBG MONITORING, ED: Glucose-Capillary: 94 mg/dL (ref 65–99)

## 2017-02-10 LAB — RAPID URINE DRUG SCREEN, HOSP PERFORMED
Amphetamines: NOT DETECTED
BARBITURATES: NOT DETECTED
Benzodiazepines: NOT DETECTED
COCAINE: NOT DETECTED
OPIATES: NOT DETECTED
Tetrahydrocannabinol: NOT DETECTED

## 2017-02-10 MED ORDER — PANTOPRAZOLE SODIUM 40 MG PO TBEC
40.0000 mg | DELAYED_RELEASE_TABLET | Freq: Every day | ORAL | Status: DC
  Administered 2017-02-10 – 2017-02-11 (×2): 40 mg via ORAL
  Filled 2017-02-10 (×2): qty 1

## 2017-02-10 MED ORDER — ATORVASTATIN CALCIUM 10 MG PO TABS: 20.0000 mg | ORAL_TABLET | Freq: Every day | ORAL | Status: DC

## 2017-02-10 MED ORDER — ENOXAPARIN SODIUM 40 MG/0.4ML ~~LOC~~ SOLN
40.0000 mg | SUBCUTANEOUS | Status: DC
  Administered 2017-02-10 – 2017-02-11 (×2): 40 mg via SUBCUTANEOUS
  Filled 2017-02-10 (×2): qty 0.4

## 2017-02-10 MED ORDER — ONDANSETRON HCL 4 MG PO TABS: 4.0000 mg | ORAL_TABLET | Freq: Four times a day (QID) | ORAL | Status: DC | PRN

## 2017-02-10 MED ORDER — ASPIRIN 325 MG PO TABS
325.0000 mg | ORAL_TABLET | Freq: Every day | ORAL | Status: DC
  Administered 2017-02-10 – 2017-02-11 (×2): 325 mg via ORAL
  Filled 2017-02-10 (×2): qty 1

## 2017-02-10 MED ORDER — VALACYCLOVIR HCL 500 MG PO TABS
500.0000 mg | ORAL_TABLET | Freq: Two times a day (BID) | ORAL | Status: DC
  Administered 2017-02-10 – 2017-02-11 (×2): 500 mg via ORAL
  Filled 2017-02-10 (×2): qty 1

## 2017-02-10 MED ORDER — DIAZEPAM 5 MG/ML IJ SOLN
5.0000 mg | Freq: Once | INTRAMUSCULAR | Status: AC
  Administered 2017-02-11: 5 mg via INTRAVENOUS
  Filled 2017-02-10: qty 2

## 2017-02-10 MED ORDER — LEVOTHYROXINE SODIUM 88 MCG PO TABS
88.0000 ug | ORAL_TABLET | Freq: Every day | ORAL | Status: DC
  Administered 2017-02-11: 88 ug via ORAL
  Filled 2017-02-10 (×2): qty 1

## 2017-02-10 MED ORDER — STROKE: EARLY STAGES OF RECOVERY BOOK
Freq: Once | Status: AC
  Administered 2017-02-10: 16:00:00
  Filled 2017-02-10: qty 1

## 2017-02-10 MED ORDER — POTASSIUM CHLORIDE CRYS ER 20 MEQ PO TBCR
30.0000 meq | EXTENDED_RELEASE_TABLET | Freq: Two times a day (BID) | ORAL | Status: AC
  Administered 2017-02-10 – 2017-02-11 (×2): 30 meq via ORAL
  Filled 2017-02-10 (×2): qty 1

## 2017-02-10 MED ORDER — ACETAMINOPHEN 650 MG RE SUPP: 650.0000 mg | Freq: Four times a day (QID) | RECTAL | Status: DC | PRN

## 2017-02-10 MED ORDER — ACETAMINOPHEN 325 MG PO TABS: 650.0000 mg | ORAL_TABLET | Freq: Four times a day (QID) | ORAL | Status: DC | PRN

## 2017-02-10 MED ORDER — ONDANSETRON HCL 4 MG/2ML IJ SOLN: 4.0000 mg | Freq: Four times a day (QID) | INTRAMUSCULAR | Status: DC | PRN

## 2017-02-10 MED ORDER — AMLODIPINE BESYLATE 2.5 MG PO TABS
2.5000 mg | ORAL_TABLET | Freq: Every day | ORAL | Status: DC
  Administered 2017-02-10 – 2017-02-11 (×2): 2.5 mg via ORAL
  Filled 2017-02-10 (×2): qty 1

## 2017-02-10 NOTE — ED Provider Notes (Signed)
[facility] DEPT Provider Note   CSN: [id number] Arrival date & time: 02/10/17  1113     History   Chief Complaint No chief complaint on file.   HPI Bailey Brooks is a 66 y.o. female.  The history is provided by the patient. No language interpreter was used.   Bailey Brooks is a 66 y.o. female who presents to the Emergency Department complaining of speech difficulties.  About 10:30 this morning she developed difficulty speaking and what she was trying to say K Matus gibberish. She attempted to text her daughter in the same thing happened. She had associated tingling in her right hand. The episode lasted approximately 10 minutes now she is back to her baseline. She had an episode that was similar several years ago when she was undergoing treatment for lymphoma and the cause was not identified at that time. 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 Past Medical History:  Diagnosis Date  . 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 Allergic rhinitis   . Arthritis   . Diastolic dysfunction    a. 11/2016 Echo: EF 60-65%, no rwma, mild LVH, Gr1 DD, triv MR, mildly dil LA, nl RV fxn.  . 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 Madelynn Done s/p RCHOP and methotrexate, c/b renal failure  . Diverticulosis   . Esophagitis   . H/O stem cell transplant ([place]) 06/2011   a. in setting of lymphoma.  . Hemorrhoids   . History of chemotherapy 22-Oct-2010   RHCOP/methotrexate-intrathecal  . History of stress test    a. 05/2010 Myoview: nl EF, no ischemia/infarct.  . Hypercholesterolemia   . Hypertension   . Hypothyroidism   . Morbid obesity ([place])   . Paget disease, extra mammary    vulva, s/p resection 2014, Dr. Sabra Heck  . Peripheral neuropathy ([place])   . Sleep apnea    a. she denies this.    Patient Active Problem List   Diagnosis Date Noted  . TIA  (transient ischemic attack) 02/10/2017  . Exertional shortness of breath 11/06/2016  . Right hip pain 08/03/2016  . Essential hypertension 07/06/2016  . Left knee pain 06/28/2016  . Recurrent candidiasis of vagina 03/02/2016  . Hypomagnesemia 03/02/2016  . Morbid obesity due to excess calories ([place]) 03/02/2016  . Adjustment disorder with mixed anxiety and depressed mood 05/25/2015  . 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 Routine general medical examination at a health care facility 02/27/2013  . Eczema 02/27/2013  . Muscle cramps 11/13/2012  . Screening for breast cancer 11/13/2012  . Personal history of lymphoma 02/20/2012  . Hypothyroidism 02/20/2012  . Hyperlipidemia 02/20/2012    Past Surgical History:  Procedure Laterality Date  . ABDOMINAL HYSTERECTOMY  1985   Hysterectomy-partial  . BREAST BIOPSY  2002   Neg - AT [facility]  . BURR HOLE W/ PLACEMENT OMMAYA RESERVOIR    . BURR HOLE W/ PLACEMENT OMMAYA RESERVOIR  2012  . COLONOSCOPY  04/2013  . ESOPHAGOGASTRODUODENOSCOPY  04/2013  . INSERTION CENTRAL VENOUS ACCESS DEVICE W/ SUBCUTANEOUS PORT  2011   Port a Cath: Right chest Double Lumen, 04-Nov-2010  . LIMBAL STEM CELL TRANSPLANT  2012  . LIVER BIOPSY     stage 4B large Bcell lymphoma  . SHOULDER ARTHROSCOPY Right 06/22/2015   Procedure: ARTHROSCOPY SHOULDER, parital repair of rotator  cuff, biceps tenodesis, decompression and debridement;  Surgeon: Corky Mull, MD;  Location: [facility] ORS;  Service: Orthopedics;  Laterality: Right;  . TONSILLECTOMY  1954  . VULVECTOMY    . VULVECTOMY PARTIAL N/A 09/29/2015   Procedure: VULVECTOMY PARTIAL;  Surgeon: Gillis Ends, MD;  Location: [facility] ORS;  Service: Gynecology;  Laterality: N/A;    OB History    No data available       Home Medications    Prior to Admission medications   Medication Sig Start Date End Date Taking? Authorizing Provider  amLODipine (NORVASC) 2.5 MG tablet Take 1  tablet (2.5 mg total) by mouth daily. 07/06/16  Yes Jackolyn Confer, MD  chlorthalidone (HYGROTON) 25 MG tablet Take 0.5 tablets (12.5 mg total) by mouth daily. 01/01/17 04/01/17 Yes Rogelia Mire, NP  lansoprazole (PREVACID) 30 MG capsule take 1 capsule by mouth once daily 01/12/17  Yes Leone Haven, MD  mometasone (ELOCON) 0.1 % cream APPLY TO AFFECTED AREA ON CHIN AND ELBOWS TWICE A DAY UNTIL CLEAR, THEN AS NEEDED FOR FLARES 03/13/16  Yes Historical Provider, MD  nystatin (MYCOSTATIN/NYSTOP) 100000 UNIT/GM POWD Apply topically as directed twice daily 03/02/16  Yes Jackolyn Confer, MD  valACYclovir (VALTREX) 500 MG tablet take 1 tablet by mouth twice a day 01/19/17  Yes Cammie Sickle, MD  levothyroxine (SYNTHROID, LEVOTHROID) 88 MCG tablet take 1 tablet by mouth every morning ON AN EMPTY STOMACH 11/13/16   Coral Spikes, DO    Family History Family History  Problem Relation Age of Onset  . Diabetes Mother     died @ 43 of MI.  Marland Kitchen Hypertension Mother   . Heart attack Mother   . Melanoma Father 75    died of complications r/t melanoma w/ lung mets.  . Kidney disease Sister     Kidney removed   . Prostate cancer Brother     Prostate - dx in 54's  . Heart disease Brother 15    reported MI @ age 76, ? treated w/ TPA->no recurrent CAD, now in 50's.  Marland Kitchen Hearing loss Maternal Aunt   . Hearing loss Maternal Grandfather     Social History Social History  Substance Use Topics  . Smoking status: Never Smoker  . Smokeless tobacco: Never Used  . Alcohol use No     Comment: former user-no current use     Allergies   Erythromycin; Iodine; Morphine and related; Oxycodone; Sulfa antibiotics; and Adhesive [tape]   Review of Systems Review of Systems  All other systems reviewed and are negative.    Physical Exam Updated Vital Signs BP (!) 159/63 (BP Location: Right Arm)   Pulse 63   Temp 99.1 F (37.3 C) (Oral)   Resp 16   SpO2 99%   Physical Exam  Constitutional:  She is oriented to person, place, and time. She appears well-developed and well-nourished.  HENT:  Head: Normocephalic and atraumatic.  Eyes: EOM are normal. Pupils are equal, round, and reactive to light.  Cardiovascular: Normal rate and regular rhythm.   No murmur heard. Pulmonary/Chest: Effort normal and breath sounds normal. No respiratory distress.  Abdominal: Soft. There is no tenderness. There is no rebound and no guarding.  Musculoskeletal: She exhibits no edema or tenderness.  Neurological: She is alert and oriented to person, place, and time. No cranial nerve deficit or sensory deficit. She exhibits normal muscle tone. Coordination normal.  Skin: Skin is warm and dry.  Psychiatric: She has a  normal mood and affect. Her behavior is normal.  Nursing note and vitals reviewed.    ED Treatments / Results  Labs (all labs ordered are listed, but only abnormal results are displayed) Labs Reviewed  COMPREHENSIVE METABOLIC PANEL - Abnormal; Notable for the following:       Result Value   Potassium 3.3 (*)    Chloride 99 (*)    Glucose, Bld 102 (*)    Albumin 3.3 (*)    GFR calc non Af Amer 60 (*)    All other components within normal limits  URINALYSIS, ROUTINE W REFLEX MICROSCOPIC - Abnormal; Notable for the following:    Leukocytes, UA TRACE (*)    Squamous Epithelial / LPF 0-5 (*)    All other components within normal limits  CBC  DIFFERENTIAL  RAPID URINE DRUG SCREEN, HOSP PERFORMED  ETHANOL  TSH  I-STAT TROPOININ, ED  CBG MONITORING, ED    EKG  EKG Interpretation  Date/Time:  Saturday February 10 2017 11:42:46 EST Ventricular Rate:  58 PR Interval:    QRS Duration: 78 QT Interval:  446 QTC Calculation: 439 R Axis:   49 Text Interpretation:  Sinus rhythm Low voltage, precordial leads ST elevation with no significant change when compared to prior 06/22/15 Confirmed by Hazle Coca [phone]) on 02/10/2017 11:49:01 AM       Radiology Ct Head Wo Contrast  Result Date:  02/10/2017 CLINICAL DATA:  Right hand and numbness and slurred speech. History of lymphoma. EXAM: CT HEAD WITHOUT CONTRAST TECHNIQUE: Contiguous axial images were obtained from the base of the skull through the vertex without intravenous contrast. COMPARISON:  PET-CT - 05/13/2014 ; brain MRI -01/10/2011 FINDINGS: Brain: Ill-defined encephalomalacia within the cranial aspect of the bilateral frontal lobes at the location of prior ventriculostomy catheters. Chronic left frontal approach ventriculostomy catheter tip again terminates within the anterior horn of the left lateral ventricle. Gray-white differentiation is otherwise well maintained. No CT evidence of acute large territory infarct. No intraparenchymal or extra-axial mass or hemorrhage. Unchanged size a configuration of the ventricles and the basilar cisterns. No midline shift. Vascular: Intracranial atherosclerosis. Skull: No displaced calvarial fracture. Old burr hole within the right frontal calvarium. Sinuses/Orbits: Limited visualization the paranasal sinuses and mastoid air cells is normal. No air-fluid levels. Other: Ventriculostomy reservoir imbedded within the subcutaneous tissues about the high left parietal calvarium, unchanged. Regional soft tissues appear otherwise normal. IMPRESSION: 1. No acute intracranial process. 2. Unchanged positioning of left frontal approach ventriculostomy catheter with unchanged size of the ventricles and basilar cisterns. Electronically Signed   By: Sandi Mariscal M.D.   On: 02/10/2017 12:29    Procedures Procedures (including critical care time)  Medications Ordered in ED Medications   stroke: mapping our early stages of recovery book (not administered)  enoxaparin (LOVENOX) injection 40 mg (not administered)  acetaminophen (TYLENOL) tablet 650 mg (not administered)    Or  acetaminophen (TYLENOL) suppository 650 mg (not administered)  ondansetron (ZOFRAN) tablet 4 mg (not administered)    Or  ondansetron  (ZOFRAN) injection 4 mg (not administered)  aspirin tablet 325 mg (not administered)  diazepam (VALIUM) injection 5 mg (not administered)     Initial Impression / Assessment and Plan / ED Course  I have reviewed the triage vital signs and the nursing notes.  Pertinent labs & imaging results that were available during my care of the patient were reviewed by me and considered in my medical decision making (see chart for details).  Patient with history of lymphoma here for speech difficulties and right arm numbness that are currently resolved. History is concerning for TIA. NIH stroke scale of 0 in the emergency department. Hospitalist consulted for observation admission for TIA workup. Neurology consulted per hospitalist recommendations. Patient updated of findings of studies and recommendation for observation admission.  Final Clinical Impressions(s) / ED Diagnoses   Final diagnoses:  Transient cerebral ischemia, unspecified type    New Prescriptions Current Discharge Medication List       Quintella Reichert, MD 02/10/17 1531

## 2017-02-10 NOTE — H&P (Signed)
History and Physical    Bailey Brooks ZDG:[id number] DOB: 26-Jul-1951 DOA: 02/10/2017  PCP: [name], MD   Patient coming from: Home  Chief Complaint: Garbled speech and tingling , numbness of the right hand   HPI: Bailey Brooks is a 66 y.o. 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 Patient reports that she knew what she wanted to say but the words were coming out wrong. She then was trying to taxi her daughter in low and the didn't make any sense. Cyanotic this patient developed right hand numbness. Patient reported having similar symptoms approximately 5 years ago when she was undergoing chemotherapy for B cell lymphoma and at that time the symptoms were never worked up it was thought related to the chemotherapy effect Patient denied dizziness, lightheadedness, headache, visual disturbance, chest pain, palpitations, any weakness of the extremities.  ED Course: On arrival to the ED she had stable vital signs, blood work revealed mild hypokalemia with potassium 3.3, mild hyperglycemia with glucose level 102, renal function or blood cells and hemoglobin were within normal limits. Patient underwent head CT that showed no intracranial abnormality EKG showed normal sinus rhythm without acute ST-T wave changes, poor R-wave progression Echo in 11/14/2016 revealed normal EF 60-65% and grade diastolic dysfunction   Review of Systems: As per HPI otherwise 10 point review of systems negative.   Ambulatory Status: Independent  Past Medical History:  Diagnosis Date  . Adult pulmonary Langerhans cell histiocytosis (HCC)    Eosinophilic  Granuloma of the Lung)  . Allergic rhinitis   . Arthritis   . Diastolic dysfunction    a. 11/2016 Echo: EF 60-65%, no rwma, mild LVH, Gr1 DD, triv MR, mildly dil LA, nl RV fxn.  . 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 Madelynn Done s/p RCHOP and methotrexate, c/b renal failure  . Diverticulosis   . Esophagitis   . H/O stem cell transplant ([place]) 06/2011   a. in setting of lymphoma.  . Hemorrhoids   . History of chemotherapy 22-Oct-2010   RHCOP/methotrexate-intrathecal  . History of stress test    a. 05/2010 Myoview: nl EF, no ischemia/infarct.  . Hypercholesterolemia   . Hypertension   . Hypothyroidism   . Morbid obesity ([place])   . Paget disease, extra mammary    vulva, s/p resection 2014, Dr. Sabra Heck  . Peripheral neuropathy ([place])   . Sleep apnea    a. she denies this.    Past Surgical History:  Procedure Laterality Date  . ABDOMINAL HYSTERECTOMY  1985   Hysterectomy-partial  . BREAST BIOPSY  2002   Neg - AT [facility]  . BURR HOLE W/ PLACEMENT OMMAYA RESERVOIR    . BURR HOLE W/ PLACEMENT OMMAYA RESERVOIR  2012  . COLONOSCOPY  04/2013  . ESOPHAGOGASTRODUODENOSCOPY  04/2013  . INSERTION CENTRAL VENOUS ACCESS DEVICE W/ SUBCUTANEOUS PORT  2011   Port a Cath: Right chest Double Lumen, 04-Nov-2010  . LIMBAL STEM CELL TRANSPLANT  2012  . LIVER BIOPSY     stage 4B large Bcell lymphoma  . SHOULDER ARTHROSCOPY Right 06/22/2015   Procedure: ARTHROSCOPY SHOULDER, parital repair of rotator cuff, biceps tenodesis, decompression and debridement;  Surgeon: Jenny Reichmann  Robby Sermon, MD;  Location: [facility] ORS;  Service: Orthopedics;  Laterality: Right;  . TONSILLECTOMY  1954  . VULVECTOMY    . VULVECTOMY PARTIAL N/A 09/29/2015   Procedure: VULVECTOMY PARTIAL;  Surgeon: Gillis Ends, MD;  Location: [facility] ORS;  Service: Gynecology;  Laterality: N/A;    Social History   Social History  . Marital status: Married    Spouse name: N/A  . Number of children: 2  . Years of  education: N/A   Occupational History  . 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 Smoking status: Never Smoker  . Smokeless tobacco: Never Used  . Alcohol use No     Comment: former user-no current use  . Drug use: No  . Sexual activity: Yes    Partners: Male   Other Topics Concern  . Not on file   Social History Narrative   Lives in [place] with husband, has 2 grown sons.  No pets. 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    Allergies  Allergen Reactions  . Erythromycin Nausea And Vomiting  . Iodine Swelling    IV iodine  . Morphine And Related Nausea And Vomiting    Hallucinations   . Sulfa Antibiotics Other (See Comments)    Dizzy/Fainting  . Adhesive [Tape] Other (See Comments) and Rash    Including band-aide Including band-aide    Family History  Problem Relation Age of Onset  . Diabetes Mother     died @ 51 of MI.  Marland Kitchen Hypertension Mother   . Heart attack Mother   . Melanoma Father 103    died of complications r/t melanoma w/ lung mets.  . Kidney disease Sister     Kidney removed   . Prostate cancer Brother     Prostate - dx in 52's  . Heart disease Brother 9    reported MI @ age 52, ? treated w/ TPA->no recurrent CAD, now in 23's.  Marland Kitchen Hearing loss Maternal Aunt   . Hearing loss Maternal Grandfather     Prior to Admission medications   Medication Sig Start Date End Date Taking? Authorizing Provider  amLODipine (NORVASC) 2.5 MG tablet Take 1 tablet (2.5 mg total) by mouth daily. 07/06/16  Yes Jackolyn Confer, MD  chlorthalidone (HYGROTON) 25 MG tablet Take 0.5 tablets (12.5 mg total) by mouth daily. 01/01/17 04/01/17 Yes Rogelia Mire, NP  lansoprazole (PREVACID) 30 MG capsule take 1 capsule by mouth once daily 01/12/17  Yes Leone Haven, MD  mometasone (ELOCON) 0.1 % cream APPLY TO AFFECTED AREA ON CHIN AND ELBOWS TWICE A DAY UNTIL CLEAR, THEN AS NEEDED FOR FLARES 03/13/16  Yes Historical Provider, MD  nystatin  (MYCOSTATIN/NYSTOP) 100000 UNIT/GM POWD Apply topically as directed twice daily 03/02/16  Yes Jackolyn Confer, MD  valACYclovir (VALTREX) 500 MG tablet take 1 tablet by mouth twice a day 01/19/17  Yes Cammie Sickle, MD  levothyroxine (SYNTHROID, LEVOTHROID) 88 MCG tablet take 1 tablet by mouth every morning ON AN EMPTY STOMACH 11/13/16   Coral Spikes, DO    Physical Exam: Vitals:   02/10/17 1200 02/10/17 1304 02/10/17 1330 02/10/17 1400  BP: 167/76 181/77 169/70 164/69  Pulse: 66 74 71 68  Resp: 17 13 15 16   Temp:      TempSrc:      SpO2: 98% 98% 98% 98%     General: Appears calm and comfortable Eyes: PERRLA, EOMI, normal lids, iris ENT:  grossly normal hearing, lips & tongue, mucous membranes moist and intact Neck: no lymphoadenopathy, masses or thyromegaly Cardiovascular: RRR, no m/r/g. No JVD, carotid bruits. No LE edema.  Respiratory: bilateral no wheezes, rales, rhonchi or cracles. Normal respiratory effort. No accessory muscle use observed Abdomen: soft, non-tender, non-distended, no organomegaly or masses appreciated. BS present in all quadrants Skin: no rash, ulcers or induration seen on limited exam Musculoskeletal: grossly normal tone BUE/BLE, good ROM, no bony abnormality or joint deformities observed Psychiatric: grossly normal mood and affect, speech fluent and appropriate, alert and oriented x3 Neurologic: CN II-XII grossly intact, moves all extremities in coordinated fashion, sensation intact  Labs on Admission: I have personally reviewed following labs and imaging studies  CBC, BMP  GFR: Estimated Creatinine Clearance: 68.6 mL/min (by C-G formula based on SCr of 0.96 mg/dL).   Creatinine Clearance: Estimated Creatinine Clearance: 68.6 mL/min (by C-G formula based on SCr of 0.96 mg/dL).    Radiological Exams on Admission: Ct Head Wo Contrast  Result Date: 02/10/2017 CLINICAL DATA:  Right hand and numbness and slurred speech. History of lymphoma.  EXAM: CT HEAD WITHOUT CONTRAST TECHNIQUE: Contiguous axial images were obtained from the base of the skull through the vertex without intravenous contrast. COMPARISON:  PET-CT - 05/13/2014 ; brain MRI -01/10/2011 FINDINGS: Brain: Ill-defined encephalomalacia within the cranial aspect of the bilateral frontal lobes at the location of prior ventriculostomy catheters. Chronic left frontal approach ventriculostomy catheter tip again terminates within the anterior horn of the left lateral ventricle. Gray-white differentiation is otherwise well maintained. No CT evidence of acute large territory infarct. No intraparenchymal or extra-axial mass or hemorrhage. Unchanged size a configuration of the ventricles and the basilar cisterns. No midline shift. Vascular: Intracranial atherosclerosis. Skull: No displaced calvarial fracture. Old burr hole within the right frontal calvarium. Sinuses/Orbits: Limited visualization the paranasal sinuses and mastoid air cells is normal. No air-fluid levels. Other: Ventriculostomy reservoir imbedded within the subcutaneous tissues about the high left parietal calvarium, unchanged. Regional soft tissues appear otherwise normal. IMPRESSION: 1. No acute intracranial process. 2. Unchanged positioning of left frontal approach ventriculostomy catheter with unchanged size of the ventricles and basilar cisterns. Electronically Signed   By: Sandi Mariscal M.D.   On: 02/10/2017 12:29    EKG: Independently reviewed - normal sinus rhythm without acute ST-T wave changes, poor R-wave progression  Assessment/Plan Principal Problem:   TIA (transient ischemic attack) Active Problems:   Hypothyroidism   Hyperlipidemia   Adjustment disorder with mixed anxiety and depressed mood   Essential hypertension    TIA Head CT is negative, we'll proceed with brain MRI, carotid ultrasound, obtain fasting lipid profile, hemoglobin A1c, TSH Neurology consult requested by EDP  Hypertension - currently  stable Continue home medication and adjust the doses if needed depending on the BP readings  Hyperlipidemia - not on any statin agent Check fasting lipids Initiate statin therapy   Hypothyroidism - most recent TSH Continue Synthroid and if needed, adjust the dose  Hypokalemia Replete and recheck   DVT prophylaxis:Lovenox Code Status: full  Family Communication: At bedside Disposition Plan: Telemetry Consults called:neurology Admission status: Observation   York Grice, [place] Pager: [phone] [facility]  If 7PM-7AM, please contact night-coverage [email or web address] Password [email or web address]  02/10/2017, 2:12 PM

## 2017-02-10 NOTE — Evaluation (Signed)
Physical Therapy Evaluation Patient Details Name: Bailey Brooks MRN: [id number] DOB: [birth year] Today's Date: 02/10/2017   History of Present Illness  Patient is a 66 yo female admitted 02/10/17 with garbled speech and Rt hand numbness/tingling.  TIA/CVA work-up underway.    PMH:  HTN, HLD, OA, peripheral neuropathy, lymphoma s/p chemo, s/p Rt shoulder surgery for torn rotator cuff  Clinical Impression  Patient is functioning at her baseline level.  Able to ambulate 200' with no assistive device.  Scored 20/24 on DGI balance assessment indicating low fall risk.  No further acute PT needs identified - PT will sign off.    Follow Up Recommendations No PT follow up;Supervision - Intermittent    Equipment Recommendations  None recommended by PT    Recommendations for Other Services       Precautions / Restrictions Precautions Precautions: None Restrictions Weight Bearing Restrictions: No      Mobility  Bed Mobility Overal bed mobility: Needs Assistance Bed Mobility: Supine to Sit;Sit to Supine     Supine to sit: Min assist Sit to supine: Supervision   General bed mobility comments: Assist to bring trunk to upright sitting position.  Transfers Overall transfer level: Independent Equipment used: None                Ambulation/Gait Ambulation/Gait assistance: Supervision Ambulation Distance (Feet): 200 Feet Assistive device: None Gait Pattern/deviations: Step-through pattern;Decreased stride length;Antalgic   Gait velocity interpretation: at or above normal speed for age/gender General Gait Details: Patient with slightly antalgic gait due to Rt hip pain.  Good balance on level surfaces.  Note unsteadiness with high level balance activities.  Encouraged patient to use cane on unsteady surfaces.  Stairs Stairs: Yes Stairs assistance: Supervision Stair Management: One rail Right;Step to pattern;Forwards Number of Stairs: 3 General stair comments: Patient using  step-to technique with 1 rail.  Reports she holds door or door jam at home.  Required supervision only for safety.  Wheelchair Mobility    Modified Rankin (Stroke Patients Only) Modified Rankin (Stroke Patients Only) Pre-Morbid Rankin Score: No significant disability Modified Rankin: No significant disability     Balance                                 Standardized Balance Assessment Standardized Balance Assessment : Dynamic Gait Index   Dynamic Gait Index Level Surface: Normal Change in Gait Speed: Normal Gait with Horizontal Head Turns: Mild Impairment Gait with Vertical Head Turns: Mild Impairment Gait and Pivot Turn: Normal Step Over Obstacle: Normal Step Around Obstacles: Normal Steps: Moderate Impairment Total Score: 20       Pertinent Vitals/Pain Pain Assessment: 0-10 Pain Score: 3  Pain Location: Rt hip Pain Descriptors / Indicators: Aching Pain Intervention(s): Monitored during session;Repositioned    Home Living Family/patient expects to be discharged to:: Private residence Living Arrangements: Spouse/significant other Available Help at Discharge: Family;Available 24 hours/day (Husband retired) Type of Home: House Home Access: Stairs to enter Entrance Stairs-Rails: None Technical brewer of Steps: 3 Home Layout: One level Home Equipment: None      Prior Function Level of Independence: Independent         Comments: Works at [employer]: Right    Extremity/Trunk Assessment   Upper Extremity Assessment Upper Extremity Assessment: RUE deficits/detail RUE Deficits / Details: Decreased shoulder ROM to < 90* flex and abd following injury/shoulder surgery.  Shoulder strength grossly 3+/5    Lower Extremity Assessment Lower Extremity Assessment: Overall WFL for tasks assessed (Rt hip painful due to OA)       Communication   Communication: No difficulties (Did not notice any garbled or  slurred speech)  Cognition Arousal/Alertness: Awake/alert Behavior During Therapy: WFL for tasks assessed/performed Overall Cognitive Status: Within Functional Limits for tasks assessed                      General Comments      Exercises     Assessment/Plan    PT Assessment Patent does not need any further PT services  PT Problem List         PT Treatment Interventions      PT Goals (Current goals can be found in the Care Plan section)  Acute Rehab PT Goals Patient Stated Goal: to go home PT Goal Formulation: All assessment and education complete, DC therapy    Frequency     Barriers to discharge        Co-evaluation               End of Session   Activity Tolerance: Patient tolerated treatment well Patient left: in bed;with call bell/phone within reach;with family/visitor present Nurse Communication: Mobility status (No f/u PT or equipment needs) PT Visit Diagnosis: Difficulty in walking, not elsewhere classified (R26.2);Pain Pain - Right/Left: Right Pain - part of body: Hip         Time: 6283-6629 PT Time Calculation (min) (ACUTE ONLY): 21 min   Charges:   PT Evaluation $PT Eval Low Complexity: 1 Procedure     PT G Codes:         Despina Pole 02-20-2017, 4:37 PM Carita Pian. Sanjuana Kava, [place] Pager [phone]

## 2017-02-10 NOTE — ED Notes (Signed)
Pt to CT

## 2017-02-10 NOTE — Consult Note (Signed)
Neurology Consult Note  Reason for Consultation: TIA  Requesting provider: Quintella Reichert, MD  CC: difficulty speaking  HPI: This is a 66-yo RH woman who presented to the ED today after she experienced some difficulty with her speech. History is from the patient who is an excellent historian.   She reports that she was trying to speak to her husband this morning when she noticed that she was having trouble with her words. She states that her language was nonsensical--she was using real words but they were not what she wanted to say. She then tried to text her daughter-in-law and again noted that her language did not make any sense. She also noticed some numbness in her right hand. These symptoms lasted for a few minutes and then fully resolved. She denies any headache, vision loss, double vision, slurred speech, weakness, incoordination, or gait changes.   She states that she had a similar episode on 2011 or 2012 where she also had trouble getting the right words out. She was receiving chemotherapy for lymphoma at the time. She recalls having several tests but says they were not able to find out what caused her symptoms and ultimately blamed in to the chemo.   PMH:  Past Medical History:  Diagnosis Date  . 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 Allergic rhinitis   . Arthritis   . CHF (congestive heart failure) ([place])   . Diastolic dysfunction    a. 11/2016 Echo: EF 60-65%, no rwma, mild LVH, Gr1 DD, triv MR, mildly dil LA, nl RV fxn.  . 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 Madelynn Done s/p RCHOP and methotrexate, c/b renal failure  . Diverticulosis   . Esophagitis   . GERD (gastroesophageal reflux disease)   . H/O stem cell transplant ([place]) 06/2011   a. in setting of lymphoma.  . Hemorrhoids   . History of chemotherapy 22-Oct-2010   RHCOP/methotrexate-intrathecal  . History of stress test    a. 05/2010  Myoview: nl EF, no ischemia/infarct.  . Hypercholesterolemia   . Hypertension   . Hypothyroidism   . Morbid obesity ([place])   . Paget disease, extra mammary    vulva, s/p resection 2014, Dr. Sabra Heck    PSH:  Past Surgical History:  Procedure Laterality Date  . ABDOMINAL HYSTERECTOMY  1985   Hysterectomy-partial  . BREAST BIOPSY  2002   Neg - AT [facility]  . BURR HOLE W/ PLACEMENT OMMAYA RESERVOIR    . BURR HOLE W/ PLACEMENT OMMAYA RESERVOIR  2012  . COLONOSCOPY  04/2013  . ESOPHAGOGASTRODUODENOSCOPY  04/2013  . INSERTION CENTRAL VENOUS ACCESS DEVICE W/ SUBCUTANEOUS PORT  2011   Port a Cath: Right chest Double Lumen, 04-Nov-2010  . LIMBAL STEM CELL TRANSPLANT  2012  . LIVER BIOPSY     stage 4B large Bcell lymphoma  . ROTATOR CUFF REPAIR Right 2016  . SHOULDER ARTHROSCOPY Right 06/22/2015   Procedure: ARTHROSCOPY SHOULDER, parital repair of rotator cuff, biceps tenodesis, decompression and debridement;  Surgeon: Corky Mull, MD;  Location: [facility] ORS;  Service: Orthopedics;  Laterality: Right;  . TONSILLECTOMY  1954  . VULVECTOMY    . VULVECTOMY PARTIAL N/A 09/29/2015   Procedure: VULVECTOMY PARTIAL;  Surgeon: Gillis Ends, MD;  Location: [facility] ORS;  Service: Gynecology;  Laterality: N/A;    Family history: Family History  Problem Relation Age of Onset  . Diabetes Mother  died @ 26 of MI.  Marland Kitchen Hypertension Mother   . Heart attack Mother   . Melanoma Father 32    died of complications r/t melanoma w/ lung mets.  . Kidney disease Sister     Kidney removed   . Prostate cancer Brother     Prostate - dx in 62's  . Heart disease Brother 26    reported MI @ age 7, ? treated w/ TPA->no recurrent CAD, now in 27's.  Marland Kitchen Hearing loss Maternal Aunt   . Hearing loss Maternal Grandfather     Social history:  Social History   Social History  . Marital status: Married    Spouse name: N/A  . Number of children: 2  . Years of education: N/A   Occupational History  .  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 Smoking status: Never Smoker  . Smokeless tobacco: Never Used  . Alcohol use No     Comment: former user-no current use  . Drug use: No  . Sexual activity: Yes    Partners: Male    Birth control/ protection: Post-menopausal   Other Topics Concern  . Not on file   Social History Narrative   Lives in [place] with husband, has 2 grown sons.  No pets. 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    Current outpatient meds: Medications reviewed and reconciled.  Current Meds  Medication Sig  . amLODipine (NORVASC) 2.5 MG tablet Take 1 tablet (2.5 mg total) by mouth daily.  . chlorthalidone (HYGROTON) 25 MG tablet Take 0.5 tablets (12.5 mg total) by mouth daily.  . lansoprazole (PREVACID) 30 MG capsule take 1 capsule by mouth once daily  . mometasone (ELOCON) 0.1 % cream APPLY TO AFFECTED AREA ON CHIN AND ELBOWS TWICE A DAY UNTIL CLEAR, THEN AS NEEDED FOR FLARES  . nystatin (MYCOSTATIN/NYSTOP) 100000 UNIT/GM POWD Apply topically as directed twice daily  . valACYclovir (VALTREX) 500 MG tablet take 1 tablet by mouth twice a day    Current inpatient meds: Medications reviewed and reconciled.  Current Facility-Administered Medications  Medication Dose Route Frequency Provider Last Rate Last Dose  . acetaminophen (TYLENOL) tablet 650 mg  650 mg Oral Q6H PRN Brenton Grills, PA-C       Or  . acetaminophen (TYLENOL) suppository 650 mg  650 mg Rectal Q6H PRN Brenton Grills, PA-C      . amLODipine (NORVASC) tablet 2.5 mg  2.5 mg Oral Daily Brenton Grills, PA-C   2.5 mg at 02/10/17 1716  . aspirin tablet 325 mg  325 mg Oral Daily Brenton Grills, PA-C   325 mg at 02/10/17 1716  . atorvastatin (LIPITOR) tablet 20 mg  20 mg Oral q1800 Brenton Grills, PA-C      . diazepam (VALIUM) injection 5 mg  5 mg Intravenous Once Maren Reamer, MD      . enoxaparin (LOVENOX) injection 40 mg  40 mg Subcutaneous Q24H Brenton Grills, PA-C   40 mg at 02/10/17 1716  . [START ON 02/11/2017] levothyroxine (SYNTHROID, LEVOTHROID) tablet 88 mcg  88 mcg Oral QAC breakfast Brenton Grills, PA-C      . ondansetron [facility]) tablet 4 mg  4 mg Oral Q6H PRN Brenton Grills, PA-C       Or  . ondansetron (ZOFRAN) injection 4 mg  4 mg Intravenous Q6H PRN Brenton Grills, PA-C      . pantoprazole (PROTONIX) EC  tablet 40 mg  40 mg Oral Daily Brenton Grills, PA-C   40 mg at 02/10/17 1716  . potassium chloride (K-DUR,KLOR-CON) CR tablet 30 mEq  30 mEq Oral BID Brenton Grills, PA-C      . valACYclovir (VALTREX) tablet 500 mg  500 mg Oral BID Brenton Grills, PA-C        Allergies: Allergies  Allergen Reactions  . Erythromycin Nausea And Vomiting  . Iodine Swelling    IV iodine  . Morphine And Related Nausea And Vomiting    Hallucinations   . Oxycodone Nausea And Vomiting  . Sulfa Antibiotics Other (See Comments)    Dizzy/Fainting  . Adhesive [Tape] Other (See Comments) and Rash    Including band-aide Including band-aide    ROS: As per HPI. A full 14-point review of systems was performed and is otherwise unremarkable apart from some occasional pain related to arthritis in her hips and knees.   PE:  BP (!) 159/63 (BP Location: Right Arm)   Pulse 63   Temp 99.1 F (37.3 C) (Oral)   Resp 16   SpO2 99%   General: WDWN, no acute distress. AAO x4. Speech clear, no dysarthria. No aphasia. Follows commands briskly. Affect is bright with congruent mood. Comportment is normal.  HEENT: Normocephalic. Neck supple without LAD. MMM, OP clear. Dentition good. Sclerae anicteric. No conjunctival injection.  CV: Regular, no murmur. Carotid pulses full and symmetric, no bruits. Distal pulses 2+ and symmetric.  Lungs: CTAB.  Abdomen: Soft, non-distended, non-tender. Bowel sounds present x4.  Extremities: No C/C/E. Neuro:  CN: Pupils are equal and round. They are symmetrically reactive from 3-->2 mm. EOMI without  nystagmus. No reported diplopia. Facial sensation is intact to light touch. Face is symmetric at rest with normal strength and mobility. Hearing is intact to conversational voice. Palate elevates symmetrically and uvula is midline. Voice is normal in tone, pitch and quality. Bilateral SCM and trapezii are 5/5. Tongue is midline with normal bulk and mobility.  Motor: Normal bulk, tone, and strength. No tremor or other abnormal movements. No drift.  Sensation: Intact to light touch. DTRs: 2+, symmetric. Toes downgoing bilaterally. No pathologic reflexes.  Coordination: Finger-to-nose and heel-to-shin are without dysmetria. Finger taps are normal in amplitude and speed, no decrement.    Labs:  Lab Results  Component Value Date   WBC 5.8 02/10/2017   HGB 14.1 02/10/2017   HCT 43.3 02/10/2017   PLT 213 02/10/2017   GLUCOSE 102 (H) 02/10/2017   CHOL 247 (H) 03/02/2016   TRIG 139.0 03/02/2016   HDL 44.80 03/02/2016   LDLDIRECT 157.9 02/27/2013   LDLCALC 175 (H) 03/02/2016   ALT 15 02/10/2017   AST 20 02/10/2017   NA 139 02/10/2017   K 3.3 (L) 02/10/2017   CL 99 (L) 02/10/2017   CREATININE 0.96 02/10/2017   BUN 17 02/10/2017   CO2 31 02/10/2017   TSH 3.27 11/06/2016   HGBA1C 5.6 03/02/2016   MICROALBUR 1.8 11/23/2014   UA notable for trace leukocyte esterase, negative nitrites Urine drug screen negative Troponin 0.00  Hgb a1c pending Fasting lipids pending  Imaging:  I have personally and independently reviewed the [facility] without contrast from today. This shows no obvious acute abnormality. She has an old burr hole in the R frontal region with venticulostomy catheter in place via a left frontal approach. There is patchy hypodensity in both frontal lobes. Atherosclerotic change is noted in the intracranial vessels diffusely. No obvious acute abnormality is  seen.  Assessment and Plan:  1. TIA: History as reported is suggestive of TIA in the L MCA territory. Known risk factors for  cerebrovascular disease in this patient include HTN, dyslipidemia, obesity, and age. Check MRI brain, TTE, carotid Dopplers, fasting lipids, and hemoglobin a1c. She was not taking an antiplatelet at home. Start aspirin 81 mg daily. Start atorvastatin 80 mg daily.   2. Expressive aphasia: Acute, now fully resolved, c/w TIA. No intervention necessary at this time.   3. R hand numbness: Acute, now fully resolved, c/w TIA. No intervention required.   This was discussed with the patient. Education was provided on the diagnosis and expected evaluation and treatment. She is in agreement with the plan as noted. She was given the opportunity to ask any questions and these were addressed to her satisfaction.

## 2017-02-10 NOTE — ED Triage Notes (Addendum)
Patient states woke up feeling fine and went into the kitchen and was trying to talk to husband and her words were all garbled. Patient tried to send her daughter in law a text-message and words were garbled. She knew she the correct words but they were unable to come out. Patient states had right hand numbness started after garbled speech- lasting for 5-10 minutes. Patient denies weakness, headache, blurry vision. Patient denies all symptoms presently

## 2017-02-11 ENCOUNTER — Observation Stay (HOSPITAL_COMMUNITY): Payer: BLUE CROSS/BLUE SHIELD

## 2017-02-11 ENCOUNTER — Observation Stay (HOSPITAL_BASED_OUTPATIENT_CLINIC_OR_DEPARTMENT_OTHER): Payer: BLUE CROSS/BLUE SHIELD

## 2017-02-11 DIAGNOSIS — Z452 Encounter for adjustment and management of vascular access device: Secondary | ICD-10-CM | POA: Diagnosis not present

## 2017-02-11 DIAGNOSIS — R4781 Slurred speech: Secondary | ICD-10-CM | POA: Diagnosis not present

## 2017-02-11 DIAGNOSIS — I10 Essential (primary) hypertension: Secondary | ICD-10-CM | POA: Diagnosis not present

## 2017-02-11 DIAGNOSIS — G459 Transient cerebral ischemic attack, unspecified: Secondary | ICD-10-CM

## 2017-02-11 DIAGNOSIS — E876 Hypokalemia: Secondary | ICD-10-CM | POA: Diagnosis not present

## 2017-02-11 DIAGNOSIS — E785 Hyperlipidemia, unspecified: Secondary | ICD-10-CM | POA: Diagnosis not present

## 2017-02-11 DIAGNOSIS — E039 Hypothyroidism, unspecified: Secondary | ICD-10-CM | POA: Diagnosis not present

## 2017-02-11 DIAGNOSIS — G451 Carotid artery syndrome (hemispheric): Secondary | ICD-10-CM | POA: Diagnosis not present

## 2017-02-11 DIAGNOSIS — F4323 Adjustment disorder with mixed anxiety and depressed mood: Secondary | ICD-10-CM

## 2017-02-11 DIAGNOSIS — Z8572 Personal history of non-Hodgkin lymphomas: Secondary | ICD-10-CM | POA: Diagnosis not present

## 2017-02-11 LAB — LIPID PANEL
CHOLESTEROL: 240 mg/dL — AB (ref 0–200)
HDL: 30 mg/dL — ABNORMAL LOW (ref >40)
LDL Cholesterol: 166 mg/dL — ABNORMAL HIGH (ref 0–99)
TRIGLYCERIDES: 219 mg/dL — AB (ref <150)
Total CHOL/HDL Ratio: 8 RATIO
VLDL: 44 mg/dL — AB (ref 0–40)

## 2017-02-11 MED ORDER — ATORVASTATIN CALCIUM 40 MG PO TABS: 40.0000 mg | ORAL_TABLET | Freq: Every day | ORAL | Status: DC

## 2017-02-11 MED ORDER — ROSUVASTATIN CALCIUM 20 MG PO TABS: 20.0000 mg | ORAL_TABLET | Freq: Every day | ORAL | 0 refills | Status: DC

## 2017-02-11 MED ORDER — DIAZEPAM 5 MG/ML IJ SOLN
5.0000 mg | Freq: Once | INTRAMUSCULAR | Status: AC
  Administered 2017-02-11: 5 mg via INTRAVENOUS
  Filled 2017-02-11: qty 2

## 2017-02-11 MED ORDER — ASPIRIN 325 MG PO TABS: 325.0000 mg | ORAL_TABLET | Freq: Every day | ORAL | 0 refills | Status: DC

## 2017-02-11 NOTE — Progress Notes (Signed)
5mg  valium wasted in sharps box w/angela R, RN.

## 2017-02-11 NOTE — Progress Notes (Addendum)
STROKE TEAM PROGRESS NOTE   HISTORY OF PRESENT ILLNESS (per record) This is a 66-yo RH woman who presented to the ED today after she experienced some difficulty with her speech. History is from the patient who is an excellent historian.   She reports that she was trying to speak to her husband this morning when she noticed that she was having trouble with her words. She states that her language was nonsensical--she was using real words but they were not what she wanted to say. She then tried to text her daughter-in-law and again noted that her language did not make any sense. She also noticed some numbness in her right hand. These symptoms lasted for a few minutes and then fully resolved. She denies any headache, vision loss, double vision, slurred speech, weakness, incoordination, or gait changes.   She states that she had a similar episode on 2011 or 2012 where she also had trouble getting the right words out. She was receiving chemotherapy for lymphoma at the time. She recalls having several tests but says they were not able to find out what caused her symptoms and ultimately blamed in to the chemo.    SUBJECTIVE (INTERVAL HISTORY) Her daughter is at the bedside.  She is eager to go home because she has to go to work tomorrow. Will cancel EEG. MRA pending.    OBJECTIVE Temp:  [97.8 F (36.6 C)-99.1 F (37.3 C)] 98.1 F (36.7 C) (03/11 0633) Pulse Rate:  [62-74] 71 (03/11 0633) Cardiac Rhythm: Normal sinus rhythm (03/10 1900) Resp:  [13-18] 16 (03/11 0633) BP: (120-181)/(46-77) 120/46 (03/11 0633) SpO2:  [95 %-99 %] 95 % (03/11 0633) Weight:  [110.5 kg (243 lb 8.3 oz)] 110.5 kg (243 lb 8.3 oz) (03/11 0100)  CBC:  Recent Labs Lab 02/10/17 1140  WBC 5.8  NEUTROABS 3.1  HGB 14.1  HCT 43.3  MCV 89.8  PLT 409    Basic Metabolic Panel:  Recent Labs Lab 02/10/17 1140  NA 139  K 3.3*  CL 99*  CO2 31  GLUCOSE 102*  BUN 17  CREATININE 0.96  CALCIUM 9.3    Lipid Panel:     Component Value Date/Time   CHOL 240 (H) 02/11/2017 0412   TRIG 219 (H) 02/11/2017 0412   HDL 30 (L) 02/11/2017 0412   CHOLHDL 8.0 02/11/2017 0412   VLDL 44 (H) 02/11/2017 0412   LDLCALC 166 (H) 02/11/2017 0412   HgbA1c:  Lab Results  Component Value Date   HGBA1C 5.6 03/02/2016   Urine Drug Screen:    Component Value Date/Time   LABOPIA NONE DETECTED 02/10/2017 1257   COCAINSCRNUR NONE DETECTED 02/10/2017 1257   LABBENZ NONE DETECTED 02/10/2017 1257   AMPHETMU NONE DETECTED 02/10/2017 1257   THCU NONE DETECTED 02/10/2017 1257   LABBARB NONE DETECTED 02/10/2017 1257      IMAGING I have personally reviewed the radiological images below and agree with the radiology interpretations.  Ct Head Wo Contrast 02/10/2017 1. No acute intracranial process.  2. Unchanged positioning of left frontal approach ventriculostomy catheter with unchanged size of the ventricles and basilar cisterns.   Mr Brain Wo Contrast 02/11/2017 1. No acute intracranial infarct or other process identified.  2. Left frontal approach shunt catheter and place with tip in the left lateral ventricle. No hydrocephalus.  3. Mild chronic microvascular ischemic disease.   MR MRA Head Wo Contrast  02/11/2017 Normal MRA of the head.   CUS - Bilateral: 1-39% ICA stenosis. Vertebral artery flow is  antegrade.  TTE 11/15/16 - Left ventricle: The cavity size was normal. Wall thickness was   increased in a pattern of mild LVH. There was mild focal basal   hypertrophy of the septum. Systolic function was normal. The   estimated ejection fraction was in the range of 60% to 65%. Wall   motion was normal; there were no regional wall motion   abnormalities. Doppler parameters are consistent with abnormal   left ventricular relaxation (grade 1 diastolic dysfunction). - Mitral valve: Calcified annulus. There was trivial regurgitation. - Left atrium: The atrium was mildly dilated. - Right ventricle: The cavity size was  normal. Systolic function   was normal.   PHYSICAL EXAM  Temp:  [98.1 F (36.7 C)-98.6 F (37 C)] 98.6 F (37 C) (03/11 1337) Pulse Rate:  [62-75] 75 (03/11 1337) Resp:  [15-18] 18 (03/11 1337) BP: (112-157)/(46-70) 112/61 (03/11 1337) SpO2:  [95 %-99 %] 97 % (03/11 1337) Weight:  [243 lb 8.3 oz (110.5 kg)] 243 lb 8.3 oz (110.5 kg) (03/11 0100)  General - obese, well developed, in no apparent distress.  Ophthalmologic - Sharp disc margins OU.   Cardiovascular - Regular rate and rhythm.  Mental Status -  Level of arousal and orientation to time, place, and person were intact. Language including expression, naming, repetition, comprehension was assessed and found intact. Attention span and concentration were normal. Fund of Knowledge was assessed and was intact.  Cranial Nerves II - XII - II - Visual field intact OU. III, IV, VI - Extraocular movements intact. V - Facial sensation intact bilaterally. VII - Facial movement intact bilaterally. VIII - Hearing & vestibular intact bilaterally. X - Palate elevates symmetrically. XI - Chin turning & shoulder shrug intact bilaterally. XII - Tongue protrusion intact.  Motor Strength - The patient's strength was normal in all extremities and pronator drift was absent.  Bulk was normal and fasciculations were absent.   Motor Tone - Muscle tone was assessed at the neck and appendages and was normal.  Reflexes - The patient's reflexes were 1+ in all extremities and she had no pathological reflexes.  Sensory - Light touch, temperature/pinprick were assessed and were symmetrical.    Coordination - The patient had normal movements in the hands and feet with no ataxia or dysmetria.  Tremor was absent.  Gait and Station - deferred   ASSESSMENT/PLAN Bailey Brooks is a 66 y.o. female with history of morbid obesity, hypothyroidism, hypertension, hyperlipidemia, history of lymphoma treated with chemotherapy, and congestive heart  failure secondary to diastolic dysfunction presenting with speech difficulties. She did not receive IV t-PA due to transient deficits.   Possible TIA - similar episode in 2011 - denies migraine hx or HA this time  Resultant  deficits resolved  MRI - No acute intracranial infarct or other process identified.   MRA - normal  Carotid Doppler - unremarkable  2D Echo - 11/14/2016 - EF 60-65%.  EEG canceled  Recommend 30 day cardiac event monitoring as outpatient to rule out A. fib. Patient has cardiologist Dr.Arida in [place] to follow.   LDL - 166  HgbA1c - pending  VTE prophylaxis - Lovenox  Diet Heart Room service appropriate? Yes; Fluid consistency: Thin  No antithrombotic prior to admission, now on aspirin 325 mg daily. Continue aspirin on discharge  Patient counseled to be compliant with her antithrombotic medications  Ongoing aggressive stroke risk factor management  Therapy recommendations: none.  Disposition:  Home today  Hypertension  Blood pressure mildly  high at times.  Long-term BP goal normotensive  Hyperlipidemia  Home meds:  No lipid lowering medications prior to admission.  LDL 166, goal < 70  Start Lipitor 40 mg daily  Continue statin at discharge  Other Stroke Risk Factors  Advanced age  Obesity, Body mass index is 42.46 kg/m., recommend weight loss, diet and exercise as appropriate   Possible TIA in 2011  Other Active Problems  Hypokalemia - 3.3 -> Supplement has been ordered  VPS - placed for previous chemo therapy  Hx of lymphoma - cancer free for 10 years as per pt   Hospital day # 0  Neurology will sign off. Please call with questions. Pt will follow up with Cecille Rubin NP at [facility] in about 6 weeks. Thanks for the consult.  Rosalin Hawking, MD PhD Stroke Neurology 02/11/2017 5:36 PM   To contact Stroke Continuity provider, please refer to [email or web address] After hours, contact General Neurology

## 2017-02-11 NOTE — Progress Notes (Addendum)
Pt was to receive 5 mg of IV valium 10 mg was sent from pharmacy during day shift. Valium was to relax patient for MRI, MRI was rescheduled for 0500 3/11 there was no indication to waste the remainder 5 mg of valium so this note is to signify the wasting of the 5 mg that remained, Wasted L. Hitt RN.  Co-signed by Meribeth Mattes RN

## 2017-02-11 NOTE — Progress Notes (Signed)
Pt got 5mg  IV valium early this am for MRI head. She has MRA head ordered now, and is requesting valium again. Texted Dr. Lonny Prude.

## 2017-02-11 NOTE — Progress Notes (Signed)
OT Cancellation Note  Patient Details Name: Bailey Brooks MRN: [id number] DOB: Bailey 13, 1952   Cancelled Treatment:    Reason Eval/Treat Not Completed: Patient at procedure or test/ unavailable. OT will attempt eval tomorrow.  [name] 02/11/2017, 3:57 PM

## 2017-02-11 NOTE — Discharge Summary (Signed)
Physician Discharge Summary  NASREEN GOEDECKE BWG:[id number] DOB: 01/16/1951 DOA: 02/10/2017  PCP: [name], MD  Admit date: 02/10/2017 Discharge date: 02/11/2017  Admitted From: Home Disposition: Home  Recommendations for Outpatient Follow-up:  1. Follow up with PCP in 1 week 2. Follow up with cardiology for an event monitor 3. Please follow up on the following pending results: Hemoglobin A1C  Home Health: None Equipment/Devices: None  Discharge Condition: Stable CODE STATUS: Full code Diet recommendation: Heart healthy   Brief/Interim Summary:  Admission HPI written by Lendell Caprice, PA-C   Chief Complaint: Garbled speech and tingling , numbness of the right hand   HPI: Bailey Brooks is a 66 y.o. 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 Patient reports that she knew what she wanted to say but the words were coming out wrong. She then was trying to taxi her daughter in low and the didn't make any sense. Cyanotic this patient developed right hand numbness. Patient reported having similar symptoms approximately 5 years ago when she was undergoing chemotherapy for B cell lymphoma and at that time the symptoms were never worked up it was thought related to the chemotherapy effect Patient denied dizziness, lightheadedness, headache, visual disturbance, chest pain, palpitations, any weakness of the extremities.  ED Course: On arrival to the ED she had stable vital signs, blood work revealed mild hypokalemia with potassium 3.3, mild hyperglycemia with glucose level 102, renal function or blood cells and hemoglobin were within  normal limits. Patient underwent head CT that showed no intracranial abnormality EKG showed normal sinus rhythm without acute ST-T wave changes, poor R-wave progression Echo in 11/14/2016 revealed normal EF 60-65% and grade diastolic dysfunction    Hospital course:  Transient ischemic attack CT and MRI negative for stroke. Lipid panel significant for LDL of 166. Hemoglobin A1C pending. Carotid ultrasound within normal limits. Neurology was consulted and recommended MRA which was normal. They recommend outpatient event monitor for 30 days. A message has been sent to cardiology for setting this up with the patient as an outpatient.   Essential hypertension Continued home amlodipine and chlorthalidone  Hyperlipidemia Not on a statin secondary to history of lymphoma with liver involvement. Per patient, this has been stable for years. Will start Crestor 20mg  as above.  Hypothyroidism Continued Synthroid  Hypokalemia Supplementation given.  Discharge Diagnoses:  Principal Problem:   TIA (transient ischemic attack) Active Problems:   Hypothyroidism   Hyperlipidemia   Adjustment disorder with mixed anxiety and depressed mood   Essential hypertension   Hypokalemia    Discharge Instructions  Discharge Instructions    Call MD for:  difficulty breathing, headache or visual disturbances    Complete by:  As directed    Call MD for:  persistant dizziness or light-headedness    Complete by:  As directed    Call MD for:  persistant nausea and vomiting    Complete by:  As directed    Call MD for:  redness, tenderness, or signs of infection (pain, swelling, redness, odor or green/yellow discharge around incision site)    Complete by:  As directed    Call MD for:  temperature >100.4    Complete by:  As directed  Diet - low sodium heart healthy    Complete by:  As directed    Increase activity slowly    Complete by:  As directed      Allergies as of 02/11/2017      Reactions    Erythromycin Nausea And Vomiting   Iodine Swelling   IV iodine (states now that this can be tolerated with Benadryl)   Morphine And Related Nausea And Vomiting, Nausea Only   Hallucinations (also)   Oxycodone Nausea And Vomiting   Sulfa Antibiotics Other (See Comments)   Dizzy/Fainting   Adhesive [tape] Rash, Other (See Comments)   Including Bandaids      Medication List    TAKE these medications   acetaminophen 650 MG CR tablet Commonly known as:  TYLENOL Take 650 mg by mouth 2 (two) times daily.   amLODipine 2.5 MG tablet Commonly known as:  NORVASC Take 1 tablet (2.5 mg total) by mouth daily.   aspirin 325 MG tablet Take 1 tablet (325 mg total) by mouth daily. Start taking on:  02/12/2017   chlorthalidone 25 MG tablet Commonly known as:  HYGROTON Take 0.5 tablets (12.5 mg total) by mouth daily.   lansoprazole 30 MG capsule Commonly known as:  PREVACID take 1 capsule by mouth once daily   levothyroxine 88 MCG tablet Commonly known as:  SYNTHROID, LEVOTHROID take 1 tablet by mouth every morning ON AN EMPTY STOMACH   mometasone 0.1 % cream Commonly known as:  ELOCON APPLY TO AFFECTED AREA ON CHIN AND ELBOWS TWICE A DAY UNTIL CLEAR, THEN AS NEEDED FOR FLARES   nystatin powder Generic drug:  nystatin Apply topically as directed twice daily What changed:  additional instructions   rosuvastatin 20 MG tablet Commonly known as:  CRESTOR Take 1 tablet (20 mg total) by mouth daily.   valACYclovir 500 MG tablet Commonly known as:  VALTREX take 1 tablet by mouth twice a day What changed:  See the new instructions.      Follow-up Information    [name], MD. Schedule an appointment as soon as possible for a visit in 1 week(s).   Specialty:  Family Medicine Why:  TIA follow-up Contact information: [address] [place] [address] [phone]        [facility] Follow up.   Why:  [place]  Monitor Contact information: [place] [address] [phone]         Allergies  Allergen Reactions  . Erythromycin Nausea And Vomiting  . Iodine Swelling    IV iodine (states now that this can be tolerated with Benadryl)  . Morphine And Related Nausea And Vomiting and Nausea Only    Hallucinations (also)  . Oxycodone Nausea And Vomiting  . Sulfa Antibiotics Other (See Comments)    Dizzy/Fainting  . Adhesive [Tape] Rash and Other (See Comments)    Including Bandaids    Consultations:  Neurology   Procedures/Studies: Ct Head Wo Contrast  Result Date: 02/10/2017 CLINICAL DATA:  Right hand and numbness and slurred speech. History of lymphoma. EXAM: CT HEAD WITHOUT CONTRAST TECHNIQUE: Contiguous axial images were obtained from the base of the skull through the vertex without intravenous contrast. COMPARISON:  PET-CT - 05/13/2014 ; brain MRI -01/10/2011 FINDINGS: Brain: Ill-defined encephalomalacia within the cranial aspect of the bilateral frontal lobes at the location of prior ventriculostomy catheters. Chronic left frontal approach ventriculostomy catheter tip again terminates within the anterior horn of the left  lateral ventricle. Gray-white differentiation is otherwise well maintained. No CT evidence of acute large territory infarct. No intraparenchymal or extra-axial mass or hemorrhage. Unchanged size a configuration of the ventricles and the basilar cisterns. No midline shift. Vascular: Intracranial atherosclerosis. Skull: No displaced calvarial fracture. Old burr hole within the right frontal calvarium. Sinuses/Orbits: Limited visualization the paranasal sinuses and mastoid air cells is normal. No air-fluid levels. Other: Ventriculostomy reservoir imbedded within the subcutaneous tissues about the high left parietal calvarium, unchanged. Regional soft tissues appear otherwise normal. IMPRESSION: 1. No acute intracranial process. 2.  Unchanged positioning of left frontal approach ventriculostomy catheter with unchanged size of the ventricles and basilar cisterns. Electronically Signed   By: Sandi Mariscal M.D.   On: 02/10/2017 12:29   Mr Jodene Nam Head Wo Contrast  Result Date: 02/11/2017 CLINICAL DATA:  Speech difficulty, RIGHT hand numbness. History of lymphoma, hypertension. EXAM: MRA HEAD WITHOUT CONTRAST TECHNIQUE: Angiographic images of the Circle of Willis were obtained using MRA technique without intravenous contrast. COMPARISON:  MRI of the head February 11, 2017 at 0539 hours FINDINGS: ANTERIOR CIRCULATION: Normal flow related enhancement of the included cervical, petrous, cavernous and supraclinoid internal carotid arteries. Patent anterior communicating artery. Normal flow related enhancement of the anterior and middle cerebral arteries, including distal segments.No large vessel occlusion, high-grade stenosis, abnormal luminal irregularity, aneurysm. POSTERIOR CIRCULATION: LEFT vertebral artery is dominant. Basilar artery is patent, with normal flow related enhancement of the main branch vessels. Normal flow related enhancement of the posterior cerebral arteries.No large vessel occlusion, high-grade stenosis, abnormal luminal irregularity, aneurysm. ANATOMIC VARIANTS: None. IMPRESSION: Normal MRA head. Electronically Signed   By: Elon Alas M.D.   On: 02/11/2017 16:03   Mr Brain Wo Contrast  Result Date: 02/11/2017 CLINICAL DATA:  Initial evaluation for acute speech difficulty, right hand numbness. EXAM: MRI HEAD WITHOUT CONTRAST TECHNIQUE: Multiplanar, multiecho pulse sequences of the brain and surrounding structures were obtained without intravenous contrast. COMPARISON:  Comparison made with prior CT from 02/10/2017. FINDINGS: Brain: Age-appropriate cerebral atrophy. Patchy and confluent T2/FLAIR hyperintensity within the periventricular and deep white matter both cerebral hemispheres, most compatible with chronic small vessel  ischemic disease. No abnormal foci of restricted diffusion to suggest acute or subacute ischemia. Gray-white matter differentiation is well maintained. No areas of acute or chronic intracranial hemorrhage. No mass lesion, midline shift, or mass effect. Focal dilatation of the temporal horn of the left lateral ventricle noted, of uncertain etiology, but stable from prior MRI from 2012. This may be related to encephalomalacia or possibly cortical dysplasia. No associated signal abnormality. Left frontal approach shunt catheter in place with tip terminating in the left lateral ventricle near the septum pellucidum. No hydrocephalus. Remote shunt catheter tract present within the right frontal lobe. No extra-axial fluid collection. Major dural sinuses are grossly patent. Pituitary gland and suprasellar region within normal limits. Vascular: Major intracranial vascular flow voids are well preserved. Skull and upper cervical spine: Craniocervical junction within normal limits. Visualized upper cervical spine unremarkable. Bone marrow signal intensity within normal limits. Changes related to shunt in place at the left frontal scalp. Remote burr hole noted at the right frontal calvarium. Sinuses/Orbits: Globes and orbital soft tissues within normal limits. Paranasal sinuses are clear. No mastoid effusion. Inner ear structures within normal limits. Other: None. IMPRESSION: 1. No acute intracranial infarct or other process identified. 2. Left frontal approach shunt catheter and place with tip in the left lateral ventricle. No hydrocephalus. 3. Mild chronic microvascular ischemic disease. Electronically Signed  By: Jeannine Boga M.D.   On: 02/11/2017 06:43      Subjective: Patient reports resolution of deficits. Ready to go home. No chest pain, palpitations or dyspnea.  Discharge Exam: Vitals:   02/11/17 0633 02/11/17 1337  BP: (!) 120/46 112/61  Pulse: 71 75  Resp: 16 18  Temp: 98.1 F (36.7 C) 98.6 F  (37 C)   Vitals:   02/11/17 0100 02/11/17 0300 02/11/17 0633 02/11/17 1337  BP: 134/61 (!) 157/70 (!) 120/46 112/61  Pulse: 64 63 71 75  Resp: 16 15 16 18   Temp: 98.1 F (36.7 C) 98.3 F (36.8 C) 98.1 F (36.7 C) 98.6 F (37 C)  TempSrc: Oral Oral Oral Oral  SpO2: 96% 97% 95% 97%  Weight: 110.5 kg (243 lb 8.3 oz)     Height: 5' 3.5" (1.613 m)       General: Pt is alert, awake, not in acute distress Cardiovascular: RRR, S1/S2 +, no rubs, no gallops Respiratory: CTA bilaterally, no wheezing, no rhonchi Neurological: 5/5 strength upper/lower extremities. No sensory deficits Abdominal: Soft, NT, ND, bowel sounds + Extremities: no edema, no cyanosis    The results of significant diagnostics from this hospitalization (including imaging, microbiology, ancillary and laboratory) are listed below for reference.     Microbiology: No results found for this or any previous visit (from the past 240 hour(s)).   Labs: BNP (last 3 results)  Recent Labs  01/01/17 1505  BNP 19.5   Basic Metabolic Panel:  Recent Labs Lab 02/10/17 1140  NA 139  K 3.3*  CL 99*  CO2 31  GLUCOSE 102*  BUN 17  CREATININE 0.96  CALCIUM 9.3   Liver Function Tests:  Recent Labs Lab 02/10/17 1140  AST 20  ALT 15  ALKPHOS 85  BILITOT 0.5  PROT 7.3  ALBUMIN 3.3*   CBC:  Recent Labs Lab 02/10/17 1140  WBC 5.8  NEUTROABS 3.1  HGB 14.1  HCT 43.3  MCV 89.8  PLT 213   CBG:  Recent Labs Lab 02/10/17 1137  GLUCAP 94   Lipid Profile  Recent Labs  02/11/17 0412  CHOL 240*  HDL 30*  LDLCALC 166*  TRIG 219*  CHOLHDL 8.0   Thyroid function studies  Recent Labs  02/10/17 1509  TSH 1.610     SIGNED:   Cordelia Poche, MD [facility] 02/11/2017, 4:20 PM Pager [phone]  If 7PM-7AM, please contact night-coverage [email or web address] Password [email or web address]

## 2017-02-11 NOTE — Discharge Instructions (Addendum)
[place] were in the hospital because of a concern for a possible transient ischemic attack. Your symptoms resolved very quickly. Your medication list has been adjusted. Please take your medications as prescribed. Please follow-up with your primary care physician and the cardiologist.   Sincerely,  Cordelia Poche, MD [facility]

## 2017-02-11 NOTE — Progress Notes (Signed)
Home now via wc to car w/sister driving.

## 2017-02-11 NOTE — Progress Notes (Signed)
VASCULAR LAB PRELIMINARY  PRELIMINARY  PRELIMINARY  PRELIMINARY  Carotid duplex completed.    Preliminary report:  1-39% ICA plaquing. Vertebral artery flow is antegrade.   Lashawna Poche, RVT 02/11/2017, 9:26 AM

## 2017-02-12 ENCOUNTER — Encounter: Payer: Self-pay | Admitting: Physician Assistant

## 2017-02-12 ENCOUNTER — Other Ambulatory Visit: Payer: Self-pay | Admitting: Physician Assistant

## 2017-02-12 DIAGNOSIS — G459 Transient cerebral ischemic attack, unspecified: Secondary | ICD-10-CM

## 2017-02-12 LAB — VAS US CAROTID
LCCADDIAS: -24 cm/s
LEFT ECA DIAS: -15 cm/s
LEFT VERTEBRAL DIAS: 18 cm/s
LICADSYS: -55 cm/s
LICAPSYS: -86 cm/s
Left CCA dist sys: -92 cm/s
Left CCA prox dias: 25 cm/s
Left CCA prox sys: 150 cm/s
Left ICA dist dias: -23 cm/s
Left ICA prox dias: -22 cm/s
RIGHT ECA DIAS: -13 cm/s
RIGHT VERTEBRAL DIAS: -8 cm/s
Right CCA prox dias: -17 cm/s
Right CCA prox sys: -88 cm/s
Right cca dist sys: -81 cm/s

## 2017-02-12 LAB — HEMOGLOBIN A1C
Hgb A1c MFr Bld: 5.6 % (ref 4.8–5.6)
Mean Plasma Glucose: 114 mg/dL

## 2017-02-12 NOTE — Progress Notes (Signed)
Received notification from IM requesting cardiology f/u with 30 day monitor. I have sent a message to our [place] staff requesting these, and asked our office to call the patient with this information. Dayna Dunn PA-C

## 2017-02-13 ENCOUNTER — Telehealth: Payer: Self-pay | Admitting: Cardiovascular Disease

## 2017-02-13 NOTE — Telephone Encounter (Signed)
Pt admitted to [facility] March 10-11 for TIA symptoms. 30 day event monitor and f/u w/Arida ordered at discharge. S/w pt who is agreeable w/plan. Order placed in [place]. Pt aware of April 30, 1:40pm appt w/Dr. Fletcher Anon.

## 2017-02-18 NOTE — Progress Notes (Deleted)
Physical Therapy Late Note for G-codes    2017-02-26 1622  PT G-Codes **NOT FOR INPATIENT CLASS**  Functional Assessment Tool Used AM-PAC 6 Clicks Basic Mobility;Clinical judgement  Functional Limitation Mobility: Walking and moving around  Mobility: Walking and Moving Around Current Status (U3833) CI  Mobility: Walking and Moving Around Goal Status [phone]) CI  Mobility: Walking and Moving Around Discharge Status [phone]) CI  Carita Pian. Sanjuana Kava, [place] Pager [phone]

## 2017-02-18 NOTE — Progress Notes (Signed)
Physical Therapy Late Note for G-Codes    02-14-2017 1622  PT G-Codes **NOT FOR INPATIENT CLASS**  Functional Assessment Tool Used AM-PAC 6 Clicks Basic Mobility;Clinical judgement  Functional Limitation Mobility: Walking and moving around  Mobility: Walking and Moving Around Current Status (F5732) CI  Mobility: Walking and Moving Around Goal Status [phone]) CI  Mobility: Walking and Moving Around Discharge Status [phone]) CI  Carita Pian. Sanjuana Kava, [place] Pager [phone]

## 2017-02-19 ENCOUNTER — Telehealth: Payer: Self-pay | Admitting: Family Medicine

## 2017-02-19 ENCOUNTER — Telehealth: Payer: Self-pay | Admitting: Cardiovascular Disease

## 2017-02-19 NOTE — Telephone Encounter (Signed)
Patient was released from [place] on 02/11/17, Patient is [employer] no TCM.

## 2017-02-19 NOTE — Telephone Encounter (Signed)
Pt is calling to let us know that she has not received her monitor.

## 2017-02-19 NOTE — Telephone Encounter (Signed)
FYI, HFU, Pt was discharged on Saturday 02/17/17. Dx was TIA. Pt is scheduled. Thank you!

## 2017-02-21 ENCOUNTER — Ambulatory Visit (INDEPENDENT_AMBULATORY_CARE_PROVIDER_SITE_OTHER): Payer: BLUE CROSS/BLUE SHIELD

## 2017-02-21 DIAGNOSIS — I10 Essential (primary) hypertension: Secondary | ICD-10-CM | POA: Diagnosis not present

## 2017-02-21 DIAGNOSIS — G459 Transient cerebral ischemic attack, unspecified: Secondary | ICD-10-CM

## 2017-02-23 ENCOUNTER — Ambulatory Visit (INDEPENDENT_AMBULATORY_CARE_PROVIDER_SITE_OTHER): Payer: BLUE CROSS/BLUE SHIELD | Admitting: Family Medicine

## 2017-02-23 ENCOUNTER — Encounter: Payer: Self-pay | Admitting: Family Medicine

## 2017-02-23 VITALS — BP 124/82 | HR 89 | Temp 98.6°F | Wt 243.0 lb

## 2017-02-23 DIAGNOSIS — E876 Hypokalemia: Secondary | ICD-10-CM

## 2017-02-23 DIAGNOSIS — E785 Hyperlipidemia, unspecified: Secondary | ICD-10-CM | POA: Diagnosis not present

## 2017-02-23 DIAGNOSIS — Z8673 Personal history of transient ischemic attack (TIA), and cerebral infarction without residual deficits: Secondary | ICD-10-CM | POA: Diagnosis not present

## 2017-02-23 LAB — COMPREHENSIVE METABOLIC PANEL
ALBUMIN: 3.8 g/dL (ref 3.5–5.2)
ALT: 15 U/L (ref 0–35)
AST: 17 U/L (ref 0–37)
Alkaline Phosphatase: 80 U/L (ref 39–117)
BUN: 21 mg/dL (ref 6–23)
CALCIUM: 9.7 mg/dL (ref 8.4–10.5)
CHLORIDE: 101 meq/L (ref 96–112)
CO2: 35 meq/L — AB (ref 19–32)
CREATININE: 0.93 mg/dL (ref 0.40–1.20)
GFR: 63.9 mL/min (ref >60.00)
Glucose, Bld: 127 mg/dL — ABNORMAL HIGH (ref 70–99)
POTASSIUM: 3.6 meq/L (ref 3.5–5.1)
Sodium: 142 mEq/L (ref 135–145)
Total Bilirubin: 0.3 mg/dL (ref 0.2–1.2)
Total Protein: 6.9 g/dL (ref 6.0–8.3)

## 2017-02-23 MED ORDER — ATORVASTATIN CALCIUM 40 MG PO TABS: 40.0000 mg | ORAL_TABLET | Freq: Every day | ORAL | 3 refills | Status: DC

## 2017-02-23 NOTE — Progress Notes (Signed)
Pre visit review using our clinic review tool, if applicable. No additional management support is needed unless otherwise documented below in the visit note. 

## 2017-02-23 NOTE — Patient Instructions (Signed)
Nice to see you. I am glad you are doing well after your hospitalization. Please monitor for any recurrence of symptoms. We'll contact you regarding what the pharmacist says about the cholesterol medication. Please continue the aspirin. Please follow up with cardiology regarding your heart monitor. If you develop numbness, weakness, speech changes, tingling, or any new or changing symptoms please seek medical attention medially.

## 2017-02-24 NOTE — Assessment & Plan Note (Signed)
Check CMP. Potassium was low in the hospital and reportedly repleaded.

## 2017-02-24 NOTE — Assessment & Plan Note (Addendum)
Patient hospitalized for TIA. Underwent imaging and lab work. Does have hyperlipidemia. Was supposed to start on Lipitor though didn't given a history of lymphoma involving her liver. Liver enzymes have been normal. Discussed starting on Lipitor. She'll continue aspirin. We will send a message to our pharmacist to ensure that given her history of lymphoma involving the liver the Lipitor is appropriate. We'll get her in to see neurology as well for follow-up. Given return precautions.

## 2017-02-24 NOTE — Assessment & Plan Note (Signed)
Start on Lipitor.

## 2017-02-24 NOTE — Progress Notes (Signed)
  [name], MD Phone: [phone]  Bailey Brooks is a 66 y.o. female who presents today for hospital follow-up.  She was hospitalized for TIA. She noted her speech was incomprehensible. She then developed tingling in her right hand and numbness in her right hand. These things resolved within 5 minutes. She has had no recurrence. She was admitted to the hospital and underwent workup that revealed no significant causative factors. She has not started the Lipitor. She did start the aspirin. She has a heart monitor on through cardiology to evaluate for occult atrial fibrillation. She's had a similar episode in the past when she was on chemotherapy. Also found to be hypokalemic and this was repleted. Lipids found to be elevated. Has not been on a statin due to the patient being worried about her history of lymphoma involving her liver.  PMH: nonsmoker.   ROS see history of present illness  Objective  Physical Exam Vitals:   02/23/17 1124  BP: 124/82  Pulse: 89  Temp: 98.6 F (37 C)    BP Readings from Last 3 Encounters:  02/23/17 124/82  02/11/17 112/61  02/06/17 132/88   Wt Readings from Last 3 Encounters:  02/23/17 243 lb (110.2 kg)  02/11/17 243 lb 8.3 oz (110.5 kg)  02/06/17 243 lb 9.6 oz (110.5 kg)    Physical Exam  Constitutional: No distress.  HENT:  Head: Normocephalic and atraumatic.  Mouth/Throat: Oropharynx is clear and moist.  Eyes: Conjunctivae are normal. Pupils are equal, round, and reactive to light.  Cardiovascular: Normal rate, regular rhythm and normal heart sounds.   Pulmonary/Chest: Effort normal.  Musculoskeletal: She exhibits no edema.  Neurological: She is alert. Gait normal.  CN 2-12 intact, 5/5 strength in bilateral biceps, triceps, grip, quads, hamstrings, plantar and dorsiflexion, sensation to light touch intact in bilateral UE and LE  Skin: Skin is warm and dry. She is not diaphoretic.     Assessment/Plan: Please see individual  problem list.  History of transient ischemic attack (TIA) Patient hospitalized for TIA. Underwent imaging and lab work. Does have hyperlipidemia. Was supposed to start on Lipitor though didn't given a history of lymphoma involving her liver. Liver enzymes have been normal. Discussed starting on Lipitor. She'll continue aspirin. We will send a message to our pharmacist to ensure that given her history of lymphoma involving the liver the Lipitor is appropriate. We'll get her in to see neurology as well for follow-up. Given return precautions.  Hyperlipidemia Start on Lipitor.  Hypokalemia Check CMP. Potassium was low in the hospital and reportedly repleaded.   Orders Placed This Encounter  Procedures  . Comp Met (CMET)    Meds ordered this encounter  Medications  . atorvastatin (LIPITOR) 40 MG tablet    Sig: Take 1 tablet (40 mg total) by mouth daily.    Dispense:  90 tablet    Refill:  [place], MD [place]

## 2017-03-01 ENCOUNTER — Telehealth: Payer: Self-pay | Admitting: Radiology

## 2017-03-01 DIAGNOSIS — E785 Hyperlipidemia, unspecified: Secondary | ICD-10-CM

## 2017-03-01 NOTE — Telephone Encounter (Signed)
PT coming in for labs Monday, are we drawing labs from Dr. Rogue Bussing or would you like to order any labs? Please advice. Thank you.

## 2017-03-01 NOTE — Telephone Encounter (Signed)
See prev phone note. Called pt and notified her that Dr. Caryl Bis stated her Liver Function to be checked in 1 month and she did not need to keep her appt for Monday. Notified pt that she did need an appt to recheck in 1 month. Pt stated she did not have her calendar on her and would call back to schedule.

## 2017-03-01 NOTE — Telephone Encounter (Signed)
I'm not sure what labs she needs from [place] at this point. She will need liver function checked in about a month. She could be rescheduled for then unless there are labs some other physician needs. Thanks.

## 2017-03-01 NOTE — Telephone Encounter (Signed)
Error

## 2017-03-01 NOTE — Telephone Encounter (Signed)
Please call patient and explain to her Dr. Caryl Bis would like this done in 1 month and to reschedule her lab appt that is originally scheduled on Monday.

## 2017-03-01 NOTE — Telephone Encounter (Signed)
Left message to return call 

## 2017-03-01 NOTE — Telephone Encounter (Signed)
Called pt to see about rescheduling lab appt in a month to recheck liver function and to verify if we are drawing Dr. Aletha Halim labs. Pt stated she was told she was suppose to come in for lab draw in a week to check liver function and we are not drawing other providers labs. Please advice.

## 2017-03-01 NOTE — Telephone Encounter (Signed)
One month is what is in her AVS. It should be done at a month. Thanks.

## 2017-03-01 NOTE — Addendum Note (Signed)
Addended by: Caryl Bis, Clarisa Danser G on: 03/01/2017 12:05 PM   Modules accepted: Orders

## 2017-03-05 ENCOUNTER — Other Ambulatory Visit: Payer: BLUE CROSS/BLUE SHIELD

## 2017-03-06 NOTE — Telephone Encounter (Signed)
Patient was called last week and will call back to reschedule lab appointment

## 2017-03-14 ENCOUNTER — Encounter: Payer: Self-pay | Admitting: Family Medicine

## 2017-03-15 ENCOUNTER — Other Ambulatory Visit: Payer: Self-pay | Admitting: Family Medicine

## 2017-03-15 DIAGNOSIS — Z8673 Personal history of transient ischemic attack (TIA), and cerebral infarction without residual deficits: Secondary | ICD-10-CM

## 2017-03-15 DIAGNOSIS — E785 Hyperlipidemia, unspecified: Secondary | ICD-10-CM

## 2017-03-19 ENCOUNTER — Other Ambulatory Visit: Payer: Self-pay | Admitting: Family Medicine

## 2017-03-19 NOTE — Telephone Encounter (Signed)
Last OV 02/23/17 do not see that this was filled in chart

## 2017-03-20 DIAGNOSIS — D1800 Hemangioma unspecified site: Secondary | ICD-10-CM | POA: Diagnosis not present

## 2017-03-20 DIAGNOSIS — L814 Other melanin hyperpigmentation: Secondary | ICD-10-CM | POA: Diagnosis not present

## 2017-03-20 DIAGNOSIS — Z1283 Encounter for screening for malignant neoplasm of skin: Secondary | ICD-10-CM | POA: Diagnosis not present

## 2017-03-20 DIAGNOSIS — D229 Melanocytic nevi, unspecified: Secondary | ICD-10-CM | POA: Diagnosis not present

## 2017-03-30 ENCOUNTER — Telehealth: Payer: Self-pay | Admitting: Physician Assistant

## 2017-03-30 NOTE — Telephone Encounter (Signed)
-----   Message from Bailey Brooks, [place] sent at 03/30/2017  8:04 AM EDT ----- Please let patient know event monitor showed normal rhythm, f/u as recommended. Dayna Dunn PA-C

## 2017-03-30 NOTE — Telephone Encounter (Signed)
Returned pts call and made her aware of her heart monitor results.

## 2017-03-30 NOTE — Telephone Encounter (Signed)
New Message  Pt is returning [place] call about her event monitor results

## 2017-04-02 ENCOUNTER — Ambulatory Visit (INDEPENDENT_AMBULATORY_CARE_PROVIDER_SITE_OTHER): Payer: BLUE CROSS/BLUE SHIELD | Admitting: Cardiovascular Disease

## 2017-04-02 ENCOUNTER — Other Ambulatory Visit (INDEPENDENT_AMBULATORY_CARE_PROVIDER_SITE_OTHER): Payer: BLUE CROSS/BLUE SHIELD

## 2017-04-02 ENCOUNTER — Encounter: Payer: Self-pay | Admitting: Cardiovascular Disease

## 2017-04-02 VITALS — BP 108/80 | HR 85 | Ht 63.5 in | Wt 245.2 lb

## 2017-04-02 DIAGNOSIS — I10 Essential (primary) hypertension: Secondary | ICD-10-CM

## 2017-04-02 DIAGNOSIS — E785 Hyperlipidemia, unspecified: Secondary | ICD-10-CM

## 2017-04-02 DIAGNOSIS — Z8673 Personal history of transient ischemic attack (TIA), and cerebral infarction without residual deficits: Secondary | ICD-10-CM

## 2017-04-02 LAB — HEPATIC FUNCTION PANEL
ALBUMIN: 3.9 g/dL (ref 3.5–5.2)
ALK PHOS: 89 U/L (ref 39–117)
ALT: 19 U/L (ref 0–35)
AST: 20 U/L (ref 0–37)
Bilirubin, Direct: 0.1 mg/dL (ref 0.0–0.3)
TOTAL PROTEIN: 7.1 g/dL (ref 6.0–8.3)
Total Bilirubin: 0.5 mg/dL (ref 0.2–1.2)

## 2017-04-02 LAB — LDL CHOLESTEROL, DIRECT: Direct LDL: 88 mg/dL

## 2017-04-02 NOTE — Patient Instructions (Signed)
Medication Instructions: Continue same medications.   Labwork: None.   Procedures/Testing: None.   Follow-Up: As needed with Dr. Arthurine Oleary.   Any Additional Special Instructions Will Be Listed Below (If Applicable).     If you need a refill on your cardiac medications before your next appointment, please call your pharmacy.   

## 2017-04-02 NOTE — Progress Notes (Signed)
Cardiology Office Note   Date:  04/02/2017   ID:  Bailey Brooks, DOB 05/17/1951, MRN [id number]  PCP:  Bailey Rumps, MD  Cardiologist:   Bailey Sacramento, MD   Chief Complaint  Patient presents with  . other    F/u testing no complaints today.  Meds reviewed verbally with pt.      History of Present Illness: Bailey Brooks is a 66 y.o. female who presents for a follow-up visit regarding dyspnea and essential hypertension. She has no prior cardiac history. She has known history of hypertension, obesity, lymphoma status post chemotherapy, and osteoarthritis. Echocardiogram in December showed normal LV systolic function and grade 1 diastolic dysfunction.  She was hospitalized in March with slurred speech and tingling. CT and MRI of the head were negative for stroke. Carotid ultrasound was normal. A 30 day outpatient telemetry was recommended. This was Brooks and showed sinus rhythm with no evidence of atrial fibrillation and no other significant arrhythmia.  She is doing reasonably well and denies any chest pain. She reports stable exertional dyspnea and no palpitations.   Past Medical History:  Diagnosis Date  . 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 Allergic rhinitis   . Arthritis   . CHF (congestive heart failure) ([place])   . Diastolic dysfunction    a. 11/2016 Echo: EF 60-65%, no rwma, mild LVH, Gr1 DD, triv MR, mildly dil LA, nl RV fxn.  . 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 Madelynn Brooks s/p RCHOP and methotrexate, c/b renal failure  . Diverticulosis   . Esophagitis   . GERD (gastroesophageal reflux disease)   . H/O stem cell transplant ([place]) 06/2011   a. in setting of lymphoma.  . Hemorrhoids   . History of chemotherapy 22-Oct-2010   RHCOP/methotrexate-intrathecal  . History of stress test    a. 05/2010 Myoview: nl EF, no ischemia/infarct.  . Hypercholesterolemia   . Hypertension    . Hypothyroidism   . Morbid obesity ([place])   . Paget disease, extra mammary    vulva, s/p resection 2014, Dr. Sabra Brooks    Past Surgical History:  Procedure Laterality Date  . ABDOMINAL HYSTERECTOMY  1985   Hysterectomy-partial  . BREAST BIOPSY  2002   Neg - AT [facility]  . BURR HOLE W/ PLACEMENT OMMAYA RESERVOIR    . BURR HOLE W/ PLACEMENT OMMAYA RESERVOIR  2012  . COLONOSCOPY  04/2013  . ESOPHAGOGASTRODUODENOSCOPY  04/2013  . INSERTION CENTRAL VENOUS ACCESS DEVICE W/ SUBCUTANEOUS PORT  2011   Port a Cath: Right chest Double Lumen, 04-Nov-2010  . LIMBAL STEM CELL TRANSPLANT  2012  . LIVER BIOPSY     stage 4B large Bcell lymphoma  . ROTATOR CUFF REPAIR Right 2016  . SHOULDER ARTHROSCOPY Right 06/22/2015   Procedure: ARTHROSCOPY SHOULDER, parital repair of rotator cuff, biceps tenodesis, decompression and debridement;  Surgeon: Bailey Mull, MD;  Location: [facility] ORS;  Service: Orthopedics;  Laterality: Right;  . TONSILLECTOMY  1954  . VULVECTOMY    . VULVECTOMY PARTIAL N/A 09/29/2015   Procedure: VULVECTOMY PARTIAL;  Surgeon: Gillis Ends, MD;  Location: [facility] ORS;  Service: Gynecology;  Laterality: N/A;     Current Outpatient Prescriptions  Medication Sig Dispense Refill  . acetaminophen (TYLENOL) 650 MG CR tablet Take 650 mg by mouth 2 (two) times daily.    Marland Kitchen amLODipine (NORVASC) 2.5 MG tablet Take 1 tablet (2.5 mg  total) by mouth daily. 90 tablet 3  . atorvastatin (LIPITOR) 40 MG tablet Take 1 tablet (40 mg total) by mouth daily. 90 tablet 3  . chlorthalidone (HYGROTON) 25 MG tablet Take 0.5 tablets (12.5 mg total) by mouth daily. 30 tablet 3  . lansoprazole (PREVACID) 30 MG capsule take 1 capsule by mouth once daily 30 capsule 2  . levothyroxine (SYNTHROID, LEVOTHROID) 88 MCG tablet take 1 tablet by mouth every morning ON AN EMPTY STOMACH 90 tablet 3  . mometasone (ELOCON) 0.1 % cream APPLY TO AFFECTED AREA ON CHIN AND ELBOWS TWICE A DAY UNTIL CLEAR, THEN AS NEEDED FOR FLARES   0  . nystatin (MYCOSTATIN/NYSTOP) 100000 UNIT/GM POWD Apply topically as directed twice daily (Patient taking differently: Apply topically as directed twice daily (as needed for irritation)) 60 g 3  . RA ASPIRIN 325 MG tablet take 1 tablet by mouth once daily 90 tablet 3  . valACYclovir (VALTREX) 500 MG tablet take 1 tablet by mouth twice a day (Patient taking differently: Take 1,000 mg by mouth every morning) 60 tablet 3   No current facility-administered medications for this visit.     Allergies:   Erythromycin; Iodine; Morphine and related; Oxycodone; Sulfa antibiotics; and Adhesive [tape]    Social History:  The patient  reports that she has never smoked. She has never used smokeless tobacco. She reports that she does not drink alcohol or use drugs.   Family History:  The patient's family history includes Diabetes in her mother; Hearing loss in her maternal aunt and maternal grandfather; Heart attack in her mother; Heart disease (age of onset: 65) in her brother; Hypertension in her mother; Kidney disease in her sister; Melanoma (age of onset: 94) in her father; Prostate cancer in her brother.    ROS:  Please see the history of present illness.   Otherwise, review of systems are positive for none.   All other systems are reviewed and negative.    PHYSICAL EXAM: VS:  BP 108/80 (BP Location: Left Arm, Patient Position: Sitting, Cuff Size: Large)   Pulse 85   Ht 5' 3.5" (1.613 m)   Wt 245 lb 4 oz (111.2 kg)   BMI 42.76 kg/m  , BMI Body mass index is 42.76 kg/m. GEN: Well nourished, well developed, in no acute distress  HEENT: normal  Neck: no JVD, carotid bruits, or masses Cardiac: RRR; no murmurs, rubs, or gallops,no edema  Respiratory:  clear to auscultation bilaterally, normal work of breathing GI: soft, nontender, nondistended, + BS MS: no deformity or atrophy  Skin: warm and dry, no rash Neuro:  Strength and sensation are intact Psych: euthymic mood, full affect   EKG:   EKG is not ordered today.   Recent Labs: 11/06/2016: Magnesium 2.1 01/01/2017: BNP 54.6 02/10/2017: Hemoglobin 14.1; Platelets 213; TSH 1.610 02/23/2017: ALT 15; BUN 21; Creatinine, Ser 0.93; Potassium 3.6; Sodium 142    Lipid Panel    Component Value Date/Time   CHOL 240 (H) 02/11/2017 0412   TRIG 219 (H) 02/11/2017 0412   HDL 30 (L) 02/11/2017 0412   CHOLHDL 8.0 02/11/2017 0412   VLDL 44 (H) 02/11/2017 0412   LDLCALC 166 (H) 02/11/2017 0412   LDLDIRECT 157.9 02/27/2013 1123      Wt Readings from Last 3 Encounters:  04/02/17 245 lb 4 oz (111.2 kg)  02/23/17 243 lb (110.2 kg)  02/11/17 243 lb 8.3 oz (110.5 kg)       PAD Screen 01/01/2017  Previous PAD dx?  No  Previous surgical procedure? No  Pain with walking? No  Feet/toe relief with dangling? No  Painful, non-healing ulcers? No  Extremities discolored? No      ASSESSMENT AND PLAN:  1.  Recent TIA: There is no evidence of cardiac source of embolism. Echocardiogram was unremarkable last year and a 30 day outpatient telemetry showed no evidence of atrial fibrillation. Continue low-dose aspirin, blood pressure control and treatment of hyperlipidemia.   2. Essential hypertension:  Blood pressure is now controlled on small dose chlorthalidone and amlodipine.  3. Hyperlipidemia: The patient was started recently on atorvastatin given recent TIA.  Disposition:   FU with me as needed.   Signed,  Bailey Sacramento, MD  04/02/2017 1:44 PM    [place]

## 2017-04-03 ENCOUNTER — Ambulatory Visit (INDEPENDENT_AMBULATORY_CARE_PROVIDER_SITE_OTHER): Payer: BLUE CROSS/BLUE SHIELD | Admitting: Family Medicine

## 2017-04-03 ENCOUNTER — Encounter: Payer: Self-pay | Admitting: Family Medicine

## 2017-04-03 DIAGNOSIS — G8929 Other chronic pain: Secondary | ICD-10-CM | POA: Insufficient documentation

## 2017-04-03 DIAGNOSIS — M545 Low back pain, unspecified: Secondary | ICD-10-CM | POA: Insufficient documentation

## 2017-04-03 MED ORDER — BACLOFEN 10 MG PO TABS: 5.0000 mg | ORAL_TABLET | Freq: Three times a day (TID) | ORAL | 0 refills | Status: DC | PRN

## 2017-04-03 NOTE — Progress Notes (Signed)
Pre visit review using our clinic review tool, if applicable. No additional management support is needed unless otherwise documented below in the visit note. 

## 2017-04-03 NOTE — Assessment & Plan Note (Signed)
Patient with acute onset right-sided low back discomfort last week with no injury. Suspect muscular strain though could be nerve impingement or spinal stenosis given her description of what makes it better. Tylenol is of some benefit. Neurologically intact. Discussed doing exercises that we have provided previously. Baclofen as a muscle relaxer. Warned that this may make her drowsy. She can use heat and ice. If not improving could consider PT. Does have history in the past of this and if continues to be recurrent issue or is persisting could consider MRI low back. Given return precautions.

## 2017-04-03 NOTE — Progress Notes (Signed)
  [name], MD Phone: [phone]  Bailey Brooks is a 66 y.o. female who presents today for same-day visit.  Patient notes about a week of right low back pain. Does radiate to her right hip. Hurts more if she stands straight up or lifts her right leg up. Does not hurt with walking on it. Does not hurt as much when she bends over. No numbness, weakness, loss of bowel or bladder function, or saddle anesthesia. No known injury. Has a history of right hip pain that she thinks this may be related to. Taking Tylenol.  PMH: Nonsmoker.  ROS see history of present illness  Objective  Physical Exam Vitals:   04/03/17 1124  BP: 130/82  Pulse: 71  Temp: 97.9 F (36.6 C)    BP Readings from Last 3 Encounters:  04/03/17 130/82  04/02/17 108/80  02/23/17 124/82   Wt Readings from Last 3 Encounters:  04/03/17 246 lb 3.2 oz (111.7 kg)  04/02/17 245 lb 4 oz (111.2 kg)  02/23/17 243 lb (110.2 kg)    Physical Exam  Constitutional: No distress.  Cardiovascular: Normal rate, regular rhythm and normal heart sounds.   Pulmonary/Chest: Effort normal and breath sounds normal.  Musculoskeletal:  No midline spine tenderness, no midline spine step-off, there is mild lumbar muscular back tenderness with no overlying skin changes, bilateral hips with normal range of motion with no discomfort  Neurological: She is alert. Gait normal.  5 out of 5 strength bilateral quads, hamstrings, plantar flexion, and dorsiflexion, sensation to light touch intact in bilateral lower extremities  Skin: Skin is warm and dry. She is not diaphoretic.     Assessment/Plan: Please see individual problem list.  Low back pain Patient with acute onset right-sided low back discomfort last week with no injury. Suspect muscular strain though could be nerve impingement or spinal stenosis given her description of what makes it better. Tylenol is of some benefit. Neurologically intact. Discussed doing exercises that we  have provided previously. Baclofen as a muscle relaxer. Warned that this may make her drowsy. She can use heat and ice. If not improving could consider PT. Does have history in the past of this and if continues to be recurrent issue or is persisting could consider MRI low back. Given return precautions.   No orders of the defined types were placed in this encounter.   Meds ordered this encounter  Medications  . baclofen (LIORESAL) 10 MG tablet    Sig: Take 0.5 tablets (5 mg total) by mouth 3 (three) times daily as needed for muscle spasms.    Dispense:  20 each    Refill:  0    [name], MD [place]

## 2017-04-03 NOTE — Patient Instructions (Signed)
Nice to see you. Your pain is likely related to muscular strain though could be related to a pinched nerve. Please do the exercises that were provided previously. We'll try a muscle relaxer called baclofen. This may make you drowsy so be careful when you first take it. You can also use heat and ice. If you develop worsening pain, or you develop numbness, weakness, loss of bowel or bladder function, numbness between your legs, or any new or changing symptoms please seek medical attention immediately.

## 2017-04-18 ENCOUNTER — Other Ambulatory Visit: Payer: Self-pay | Admitting: Family Medicine

## 2017-04-27 ENCOUNTER — Ambulatory Visit: Payer: BLUE CROSS/BLUE SHIELD | Admitting: Internal Medicine

## 2017-04-27 ENCOUNTER — Other Ambulatory Visit: Payer: BLUE CROSS/BLUE SHIELD

## 2017-05-02 ENCOUNTER — Ambulatory Visit: Payer: BLUE CROSS/BLUE SHIELD | Admitting: Internal Medicine

## 2017-05-02 ENCOUNTER — Other Ambulatory Visit: Payer: BLUE CROSS/BLUE SHIELD

## 2017-05-09 DIAGNOSIS — N9089 Other specified noninflammatory disorders of vulva and perineum: Secondary | ICD-10-CM | POA: Diagnosis not present

## 2017-05-09 DIAGNOSIS — N904 Leukoplakia of vulva: Secondary | ICD-10-CM | POA: Diagnosis not present

## 2017-05-15 ENCOUNTER — Encounter: Payer: Self-pay | Admitting: Nurse Practitioner

## 2017-05-15 ENCOUNTER — Ambulatory Visit (INDEPENDENT_AMBULATORY_CARE_PROVIDER_SITE_OTHER): Payer: BLUE CROSS/BLUE SHIELD | Admitting: Nurse Practitioner

## 2017-05-15 VITALS — BP 132/84 | HR 68 | Ht 63.5 in | Wt 245.0 lb

## 2017-05-15 DIAGNOSIS — I10 Essential (primary) hypertension: Secondary | ICD-10-CM

## 2017-05-15 DIAGNOSIS — G459 Transient cerebral ischemic attack, unspecified: Secondary | ICD-10-CM | POA: Insufficient documentation

## 2017-05-15 DIAGNOSIS — E785 Hyperlipidemia, unspecified: Secondary | ICD-10-CM

## 2017-05-15 NOTE — Progress Notes (Signed)
[facility]  PATIENT: Bailey Brooks DOB: [birth year]   REASON FOR VISIT: Hospital follow-up for stroke HISTORY FROM: Patient    HISTORY OF PRESENT ILLNESS:FROM RECORDThis is a 66-yo RH woman who presented to the ED today after she experienced some difficulty with her speech. History is from the patient who is an excellent historian.   She reports that she was trying to speak to her husband this morning when she noticed that she was having trouble with her words. She states that her language was nonsensical--she was using real words but they were not what she wanted to say. She then tried to text her daughter-in-law and again noted that her language did not make any sense. She also noticed some numbness in her right hand. These symptoms lasted for a few minutes and then fully resolved. She denies any headache, vision loss, double vision, slurred speech, weakness, incoordination, or gait changes.  She states that she had a similar episode on 2011 or 2012 where she also had trouble getting the right words out. She was receiving chemotherapy for lymphoma at the time. She recalls having several tests but says they were not able to find out what caused her symptoms and ultimately blamed in to the chemo.  MRI of the brain no acute intracranial infarct. MRA normal. Carotid Doppler unremarkable. 2-D echo EF 60-65%. Hemoglobin A1c 5.6 LDL was 66 she was not on aspirin prior to admission Interval history 06/12/2018CM Ms. Arch, returns to the stroke clinic today for hospital follow-up. She remains on aspirin for secondary stroke prevention without further stroke or TIA symptoms she has minimal bruising. 30 day event monitoring showed normal sinus rhythm without  atrial fibrillation or other arrhythmias. Blood pressure in the office today 132/84. She is on Lipitor for hyperlipidemia without myalgias. She is being seen for chronic hip and back pain by her primary care. She gets no  regular exercise. And she is obese. She returns for reevaluation     REVIEW OF SYSTEMS: Full 14 system review of systems performed and notable only for those listed, all others are neg:  Constitutional: neg  Cardiovascular: neg Ear/Nose/Throat: neg  Skin: neg Eyes: neg Respiratory: neg Gastroitestinal: neg  Hematology/Lymphatic: neg  Endocrine: neg Musculoskeletal:neg Allergy/Immunology: neg Neurological: Spell of numbness and speech difficulty on 3/5 66 Psychiatric: neg Sleep : neg   ALLERGIES: Allergies  Allergen Reactions  . Erythromycin Nausea And Vomiting  . Iodine Swelling    IV iodine (states now that this can be tolerated with Benadryl)  . Morphine And Related Nausea And Vomiting and Nausea Only    Hallucinations (also)  . Oxycodone Nausea And Vomiting  . Sulfa Antibiotics Other (See Comments)    Dizzy/Fainting  . Adhesive [Tape] Rash and Other (See Comments)    Including Bandaids    HOME MEDICATIONS: Outpatient Medications Prior to Visit  Medication Sig Dispense Refill  . acetaminophen (TYLENOL) 650 MG CR tablet Take 650 mg by mouth 2 (two) times daily.    Marland Kitchen amLODipine (NORVASC) 2.5 MG tablet Take 1 tablet (2.5 mg total) by mouth daily. 90 tablet 3  . atorvastatin (LIPITOR) 40 MG tablet Take 1 tablet (40 mg total) by mouth daily. 90 tablet 3  . baclofen (LIORESAL) 10 MG tablet Take 0.5 tablets (5 mg total) by mouth 3 (three) times daily as needed for muscle spasms. 20 each 0  . lansoprazole (PREVACID) 30 MG capsule take 1 capsule by mouth once daily 30 capsule 2  . levothyroxine (  SYNTHROID, LEVOTHROID) 88 MCG tablet take 1 tablet by mouth every morning ON AN EMPTY STOMACH 90 tablet 3  . mometasone (ELOCON) 0.1 % cream APPLY TO AFFECTED AREA ON CHIN AND ELBOWS TWICE A DAY UNTIL CLEAR, THEN AS NEEDED FOR FLARES  0  . nystatin (MYCOSTATIN/NYSTOP) 100000 UNIT/GM POWD Apply topically as directed twice daily (Patient taking differently: Apply topically as directed  twice daily (as needed for irritation)) 60 g 3  . RA ASPIRIN 325 MG tablet take 1 tablet by mouth once daily 90 tablet 3  . valACYclovir (VALTREX) 500 MG tablet take 1 tablet by mouth twice a day (Patient taking differently: Take 1,000 mg by mouth every morning) 60 tablet 3  . chlorthalidone (HYGROTON) 25 MG tablet Take 0.5 tablets (12.5 mg total) by mouth daily. 30 tablet 3   No facility-administered medications prior to visit.     PAST MEDICAL HISTORY: Past Medical History:  Diagnosis Date  . 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 Allergic rhinitis   . Arthritis   . CHF (congestive heart failure) ([place])   . Diastolic dysfunction    a. 11/2016 Echo: EF 60-65%, no rwma, mild LVH, Gr1 DD, triv MR, mildly dil LA, nl RV fxn.  . 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 Madelynn Done s/p RCHOP and methotrexate, c/b renal failure  . Diverticulosis   . Esophagitis   . GERD (gastroesophageal reflux disease)   . H/O stem cell transplant ([place]) 06/2011   a. in setting of lymphoma.  . Hemorrhoids   . History of chemotherapy 22-Oct-2010   RHCOP/methotrexate-intrathecal  . History of stress test    a. 05/2010 Myoview: nl EF, no ischemia/infarct.  . Hypercholesterolemia   . Hypertension   . Hypothyroidism   . Morbid obesity ([place])   . Paget disease, extra mammary    vulva, s/p resection 2014, Dr. Sabra Heck    PAST SURGICAL HISTORY: Past Surgical History:  Procedure Laterality Date  . ABDOMINAL HYSTERECTOMY  1985   Hysterectomy-partial  . BREAST BIOPSY  2002   Neg - AT [facility]  . BURR HOLE W/ PLACEMENT OMMAYA RESERVOIR    . BURR HOLE W/ PLACEMENT OMMAYA RESERVOIR  2012  . COLONOSCOPY  04/2013  . ESOPHAGOGASTRODUODENOSCOPY  04/2013  . INSERTION CENTRAL VENOUS ACCESS DEVICE W/ SUBCUTANEOUS PORT  2011   Port a Cath: Right chest Double Lumen, 04-Nov-2010  . LIMBAL STEM CELL TRANSPLANT  2012  . LIVER BIOPSY     stage 4B  large Bcell lymphoma  . ROTATOR CUFF REPAIR Right 2016  . SHOULDER ARTHROSCOPY Right 06/22/2015   Procedure: ARTHROSCOPY SHOULDER, parital repair of rotator cuff, biceps tenodesis, decompression and debridement;  Surgeon: Corky Mull, MD;  Location: [facility] ORS;  Service: Orthopedics;  Laterality: Right;  . TONSILLECTOMY  1954  . VULVECTOMY    . VULVECTOMY PARTIAL N/A 09/29/2015   Procedure: VULVECTOMY PARTIAL;  Surgeon: Gillis Ends, MD;  Location: [facility] ORS;  Service: Gynecology;  Laterality: N/A;    FAMILY HISTORY: Family History  Problem Relation Age of Onset  . Diabetes Mother        died @ 55 of MI.  Marland Kitchen Hypertension Mother   . Heart attack Mother   . Melanoma Father 57       died of complications r/t melanoma w/ lung mets.  . Kidney disease Sister        Kidney removed   .  Prostate cancer Brother        Prostate - dx in 22's  . Heart disease Brother 93       reported MI @ age 7, ? treated w/ TPA->no recurrent CAD, now in 49's.  Marland Kitchen Hearing loss Maternal Aunt   . Hearing loss Maternal Grandfather     SOCIAL HISTORY: Social History   Social History  . Marital status: Married    Spouse name: N/A  . Number of children: 2  . Years of education: N/A   Occupational History  . 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 Smoking status: Never Smoker  . Smokeless tobacco: Never Used  . Alcohol use No     Comment: former user-no current use  . Drug use: No  . Sexual activity: Yes    Partners: Male    Birth control/ protection: Post-menopausal   Other Topics Concern  . Not on file   Social History Narrative   Lives in [place] with husband, has 2 grown sons.  No pets. 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   Right-handed   Caffeine: occasional caffeine free/diet soda or hot tea     PHYSICAL EXAM  Vitals:   05/15/17 1548  BP: 132/84  Pulse: 68  Weight: 245 lb (111.1 kg)  Height: 5' 3.5" (1.613 m)   Body mass index is 42.72  kg/m.  Generalized: Well developed, Obese female in no acute distress  Head: normocephalic and atraumatic,. Oropharynx benign  Neck: Supple, no carotid bruits  Cardiac: Regular rate rhythm, no murmur  Musculoskeletal: No deformity   Neurological examination   Mentation: Alert oriented to time, place, history taking. Attention span and concentration appropriate. Recent and remote memory intact.  Follows all commands speech and language fluent.   Cranial nerve II-XII: Pupils were equal round reactive to light extraocular movements were full, visual field were full on confrontational test. Facial sensation and strength were normal. hearing was intact to finger rubbing bilaterally. Uvula tongue midline. head turning and shoulder shrug were normal and symmetric.Tongue protrusion into cheek strength was normal. Motor: normal bulk and tone, full strength in the BUE, BLE, fine finger movements normal, no pronator drift. No focal weakness Sensory: normal and symmetric to light touch,  in the upper and lower extremities Coordination: finger-nose-finger, heel-to-shin bilaterally, no dysmetria Reflexes: 1+ upper lower and symmetric, plantar responses were flexor bilaterally. Gait and Station: Rising up from seated position without assistance, normal stance,  moderate stride, good arm swing, smooth turning, able to perform tiptoe, and heel walking without difficulty. Tandem gait is unsteady  DIAGNOSTIC DATA (LABS, IMAGING, TESTING) - I reviewed patient records, labs, notes, testing and imaging myself where available.  Lab Results  Component Value Date   WBC 5.8 02/10/2017   HGB 14.1 02/10/2017   HCT 43.3 02/10/2017   MCV 89.8 02/10/2017   PLT 213 02/10/2017      Component Value Date/Time   NA 142 02/23/2017 1157   NA 138 09/03/2013 0809   K 3.6 02/23/2017 1157   K 4.3 11/02/2014 0932   CL 101 02/23/2017 1157   CL 105 09/03/2013 0809   CO2 35 (H) 02/23/2017 1157   CO2 28 09/03/2013 0809    GLUCOSE 127 (H) 02/23/2017 1157   GLUCOSE 100 (H) 09/03/2013 0809   BUN 21 02/23/2017 1157   BUN 17 09/03/2013 0809   CREATININE 0.93 02/23/2017 1157   CREATININE 0.96 11/02/2014 0929   CALCIUM 9.7 02/23/2017 1157  CALCIUM 9.1 09/03/2013 0809   PROT 7.1 04/02/2017 1019   PROT 6.6 11/02/2014 0929   ALBUMIN 3.9 04/02/2017 1019   ALBUMIN 3.3 (L) 11/02/2014 0929   AST 20 04/02/2017 1019   AST 16 11/02/2014 0929   ALT 19 04/02/2017 1019   ALT 19 11/02/2014 0929   ALKPHOS 89 04/02/2017 1019   ALKPHOS 87 11/02/2014 0929   BILITOT 0.5 04/02/2017 1019   BILITOT 0.3 11/02/2014 0929   GFRNONAA 60 (L) 02/10/2017 1140   GFRNONAA >60 11/02/2014 0929   GFRNONAA >60 07/27/2014 0836   GFRAA >60 02/10/2017 1140   GFRAA >60 11/02/2014 0929   GFRAA >60 07/27/2014 0836   Lab Results  Component Value Date   CHOL 240 (H) 02/11/2017   HDL 30 (L) 02/11/2017   LDLCALC 166 (H) 02/11/2017   LDLDIRECT 88.0 04/02/2017   TRIG 219 (H) 02/11/2017   CHOLHDL 8.0 02/11/2017   Lab Results  Component Value Date   HGBA1C 5.6 02/11/2017   No results found for: [id number] Lab Results  Component Value Date   TSH 1.610 02/10/2017     ASSESSMENT AND PLAN  66 y.o. year old female  has a past medical history of Adult pulmonary Langerhans cell histiocytosis ([place]); Diffuse large B cell lymphoma [facility]) (Nov 2011); Hypercholesterolemia; Hypertension; Hypothyroidism; Morbid obesity ([place]); and Paget disease, here for hospital follow-up for TIA MRI of the brain no acute intracranial infarct. MRA normal. Carotid Doppler unremarkable. 2-D echo EF 60-65%. Hemoglobin A1c 5.6 LDL was 66 she was not on aspirin prior to admission. The patient is a current patient of Dr. Erlinda Hong  who is out of the office today . This note is sent to the work in doctor.      PLAN: Stressed the importance of management of risk factors to prevent further stroke Continue aspirin for secondary stroke prevention Maintain strict control of hypertension  with blood pressure goal below 130/90, today's reading 132/84 continue antihypertensive medications Cholesterol with LDL cholesterol less than 70, followed by primary care,  most recent 88 continue Lipitor Exercise by walking, slowly increase ,  eat healthy diet with whole grains,  fresh fruits and vegetables F/U in 4 months Discussed risk for recurrent stroke/ TIA and answered additional questions This was a visit requiring 25 minutes and medical decision making of high complexity with extensive review of history, hospital chart, counseling and answering questions Dennie Bible, [facility], [facility], APRN  [facility] [address], [place], [place] [address] [phone]

## 2017-05-15 NOTE — Patient Instructions (Signed)
Stressed the importance of management of risk factors to prevent further stroke Continue aspirin for secondary stroke prevention Maintain strict control of hypertension with blood pressure goal below 130/90, today's reading 132/84 continue antihypertensive medications Cholesterol with LDL cholesterol less than 70, followed by primary care,  most recent 88 continue Lipitor Exercise by walking, slowly increase ,  eat healthy diet with whole grains,  fresh fruits and vegetables F/U in 4 months

## 2017-05-16 ENCOUNTER — Inpatient Hospital Stay: Payer: BLUE CROSS/BLUE SHIELD | Attending: Obstetrics and Gynecology | Admitting: Obstetrics and Gynecology

## 2017-05-16 VITALS — BP 138/86 | HR 68 | Temp 99.0°F | Resp 18 | Ht 63.5 in | Wt 244.4 lb

## 2017-05-16 DIAGNOSIS — I509 Heart failure, unspecified: Secondary | ICD-10-CM | POA: Insufficient documentation

## 2017-05-16 DIAGNOSIS — E876 Hypokalemia: Secondary | ICD-10-CM | POA: Insufficient documentation

## 2017-05-16 DIAGNOSIS — C519 Malignant neoplasm of vulva, unspecified: Secondary | ICD-10-CM

## 2017-05-16 DIAGNOSIS — F4323 Adjustment disorder with mixed anxiety and depressed mood: Secondary | ICD-10-CM

## 2017-05-16 DIAGNOSIS — Z8673 Personal history of transient ischemic attack (TIA), and cerebral infarction without residual deficits: Secondary | ICD-10-CM

## 2017-05-16 DIAGNOSIS — E039 Hypothyroidism, unspecified: Secondary | ICD-10-CM

## 2017-05-16 DIAGNOSIS — Z79899 Other long term (current) drug therapy: Secondary | ICD-10-CM | POA: Diagnosis not present

## 2017-05-16 DIAGNOSIS — Z90710 Acquired absence of both cervix and uterus: Secondary | ICD-10-CM | POA: Insufficient documentation

## 2017-05-16 DIAGNOSIS — Z7982 Long term (current) use of aspirin: Secondary | ICD-10-CM | POA: Insufficient documentation

## 2017-05-16 DIAGNOSIS — E786 Lipoprotein deficiency: Secondary | ICD-10-CM

## 2017-05-16 DIAGNOSIS — Z6841 Body Mass Index (BMI) 40.0 and over, adult: Secondary | ICD-10-CM

## 2017-05-16 DIAGNOSIS — I110 Hypertensive heart disease with heart failure: Secondary | ICD-10-CM | POA: Diagnosis not present

## 2017-05-16 DIAGNOSIS — K219 Gastro-esophageal reflux disease without esophagitis: Secondary | ICD-10-CM | POA: Diagnosis not present

## 2017-05-16 DIAGNOSIS — Z8572 Personal history of non-Hodgkin lymphomas: Secondary | ICD-10-CM | POA: Insufficient documentation

## 2017-05-16 DIAGNOSIS — E7800 Pure hypercholesterolemia, unspecified: Secondary | ICD-10-CM | POA: Diagnosis not present

## 2017-05-16 DIAGNOSIS — N9089 Other specified noninflammatory disorders of vulva and perineum: Secondary | ICD-10-CM | POA: Insufficient documentation

## 2017-05-16 NOTE — Progress Notes (Signed)
Gynecologic Oncology Interval Visit   Referring Provider: Dr. Kenton Kingfisher.  Chief Concern: Recurrent paget's disease of the vulva  Subjective:  Bailey Brooks is a 66 y.o. female who is seen in consultation from Dr. Kenton Kingfisher for Paget's disease of the vulva.   She presents for her surveillance visit after undergoing WLE, and vulvar biopsy 09/29/2015.    She has no complaints of new lesions, vulvar itching or irritation.   At her last visit she was noted to have elevated BP's and we recommended that she see her PCP. Her BP meds were altered. On 02/10/2017 she presented to the ED with speech difficulties and right arm numbness. Evaluation was performed including MRI of the brain no acute intracranial infarct. MRA normal. Carotid Doppler unremarkable. She was diagnosed with a TIA. She is supposed to be on aspirin for secondary stroke prevention and to maintain strict control of hypertension with blood pressure goal below 130/90.   Gynecologic Oncology History: Bailey Brooks has a history of localized vulvar Paget's disease.   04/2013    vulvar biopsy revealed Paget's disease               WLE, additional margins resected for positive disease on [place] evaluation.   Part A: VULVA, VAGINAL MARGIN:  - POSITIVE FOR EXTRAMAMMARY PAGET'S DISEASE.  Marland Kitchen  Part B: VULVA, POSTERIOR MARGIN:  - POSITIVE FOR EXTRAMAMMARY PAGET'S DISEASE.  Marland Kitchen  Part C: VULVA, RIGHT, PARTIAL VULVECTOMY:  - EXTRAMAMMARY PAGET'S DISEASE.  Marland Kitchen  Part D: VULVA, NEW VAGINAL MARGIN:  - POSITIVE FOR EXTRAMAMMARY PAGET'S DISEASE.   07/28/2015 for routine surveillance an area on the right vulva seemed more suspicious for recurrence. This was biopsied and confirmed recurrent Paget's disease.   She underwent repeat excision 09/29/15 A. VULVA; EXCISION:  - RARE CYTOKERATIN 7 POSITIVE INTRAEPIDERMAL CELLS, INTERPRETED AS  RESIDUAL PAGET DISEASE.  - THE SURGICAL MARGINS ARE CLEAR.   B. VULVA, 11:00; BIOPSY:  - RARE CYTOKERATIN 7 POSITIVE  INTRAEPIDERMAL CELLS, INTERPRETED AS PAGET  DISEASE.   After her surgery she has not had any further issues.    Of note she has had a h/o recurrent vulvar candidiasis. Prior to her vulvar surgery 09/2015 she received 5 weekly doses of oral diflucan for vulvar candidiasis. Postop she was treated for yeast infection again in 12/17 and then took one Diflucan a week for six months with good results.    Problem List: Patient Active Problem List   Diagnosis Date Noted  . Vulvar lesion 05/16/2017  . TIA (transient ischemic attack) 05/15/2017  . Low back pain 04/03/2017  . History of transient ischemic attack (TIA) 02/10/2017  . Hypokalemia 02/10/2017  . Exertional shortness of breath 11/06/2016  . Right hip pain 08/03/2016  . Essential hypertension 07/06/2016  . Left knee pain 06/28/2016  . Recurrent candidiasis of vagina 03/02/2016  . Hypomagnesemia 03/02/2016  . Morbid obesity due to excess calories ([place]) 03/02/2016  . Adjustment disorder with mixed anxiety and depressed mood 05/25/2015  . 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 Routine general medical examination at a health care facility 02/27/2013  . Eczema 02/27/2013  . Muscle cramps 11/13/2012  . Screening for breast cancer 11/13/2012  . Personal history of lymphoma 02/20/2012  . Hypothyroidism 02/20/2012  . Hyperlipidemia 02/20/2012    Past Medical History: Past Medical History:  Diagnosis Date  . 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 Allergic rhinitis   .  Arthritis   . CHF (congestive heart failure) ([place])   . Diastolic dysfunction    a. 11/2016 Echo: EF 60-65%, no rwma, mild LVH, Gr1 DD, triv MR, mildly dil LA, nl RV fxn.  . 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 Madelynn Done s/p RCHOP and methotrexate, c/b renal failure  . Diverticulosis   . Esophagitis   . GERD (gastroesophageal reflux disease)   .  H/O stem cell transplant ([place]) 06/2011   a. in setting of lymphoma.  . Hemorrhoids   . History of chemotherapy 22-Oct-2010   RHCOP/methotrexate-intrathecal  . History of stress test    a. 05/2010 Myoview: nl EF, no ischemia/infarct.  . Hypercholesterolemia   . Hypertension   . Hypothyroidism   . Morbid obesity ([place])   . Paget disease, extra mammary    vulva, s/p resection 2014, Dr. Sabra Heck  . TIA (transient ischemic attack)     Past Surgical History: Past Surgical History:  Procedure Laterality Date  . ABDOMINAL HYSTERECTOMY  1985   Hysterectomy-partial  . BREAST BIOPSY  2002   Neg - AT [facility]  . BURR HOLE W/ PLACEMENT OMMAYA RESERVOIR    . BURR HOLE W/ PLACEMENT OMMAYA RESERVOIR  2012  . COLONOSCOPY  04/2013  . ESOPHAGOGASTRODUODENOSCOPY  04/2013  . INSERTION CENTRAL VENOUS ACCESS DEVICE W/ SUBCUTANEOUS PORT  2011   Port a Cath: Right chest Double Lumen, 04-Nov-2010  . LIMBAL STEM CELL TRANSPLANT  2012  . LIVER BIOPSY     stage 4B large Bcell lymphoma  . ROTATOR CUFF REPAIR Right 2016  . SHOULDER ARTHROSCOPY Right 06/22/2015   Procedure: ARTHROSCOPY SHOULDER, parital repair of rotator cuff, biceps tenodesis, decompression and debridement;  Surgeon: Corky Mull, MD;  Location: [facility] ORS;  Service: Orthopedics;  Laterality: Right;  . TONSILLECTOMY  1954  . VULVECTOMY    . VULVECTOMY PARTIAL N/A 09/29/2015   Procedure: VULVECTOMY PARTIAL;  Surgeon: Gillis Ends, MD;  Location: [facility] ORS;  Service: Gynecology;  Laterality: N/A;    Past Gynecologic History:  S/p hysterectomy  OB History:  OB History  No data available    Family History: Family History  Problem Relation Age of Onset  . Diabetes Mother        died @ 22 of MI.  Marland Kitchen Hypertension Mother   . Heart attack Mother   . Melanoma Father 79       died of complications r/t melanoma w/ lung mets.  . Kidney disease Sister        Kidney removed   . Prostate cancer Brother        Prostate - dx in 37's  . Heart  disease Brother 57       reported MI @ age 81, ? treated w/ TPA->no recurrent CAD, now in 42's.  Marland Kitchen Hearing loss Maternal Aunt   . Hearing loss Maternal Grandfather     Social History: Social History   Social History  . Marital status: Married    Spouse name: N/A  . Number of children: 2  . Years of education: N/A   Occupational History  . 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 Smoking status: Never Smoker  . Smokeless tobacco: Never Used  . Alcohol use No     Comment: former user-no current use  . Drug use: No  . Sexual activity: Yes    Partners: Male    Birth control/ protection: Post-menopausal  Other Topics Concern  . Not on file   Social History Narrative   Lives in [place] with husband, has 2 grown sons.  No pets. 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   Right-handed   Caffeine: occasional caffeine free/diet soda or hot tea    Allergies: Allergies  Allergen Reactions  . Erythromycin Nausea And Vomiting  . Iodine Swelling    IV iodine (states now that this can be tolerated with Benadryl)  . Morphine And Related Nausea And Vomiting and Nausea Only    Hallucinations (also)  . Oxycodone Nausea And Vomiting  . Sulfa Antibiotics Other (See Comments)    Dizzy/Fainting  . Adhesive [Tape] Rash and Other (See Comments)    Including Bandaids    Current Medications: Current Outpatient Prescriptions  Medication Sig Dispense Refill  . acetaminophen (TYLENOL) 650 MG CR tablet Take 650 mg by mouth 2 (two) times daily.    Marland Kitchen amLODipine (NORVASC) 2.5 MG tablet Take 1 tablet (2.5 mg total) by mouth daily. 90 tablet 3  . atorvastatin (LIPITOR) 40 MG tablet Take 1 tablet (40 mg total) by mouth daily. 90 tablet 3  . baclofen (LIORESAL) 10 MG tablet Take 0.5 tablets (5 mg total) by mouth 3 (three) times daily as needed for muscle spasms. 20 each 0  . chlorthalidone (HYGROTON) 25 MG tablet Take 12.5 mg by mouth daily.   0  . lansoprazole (PREVACID)  30 MG capsule take 1 capsule by mouth once daily 30 capsule 2  . levothyroxine (SYNTHROID, LEVOTHROID) 88 MCG tablet take 1 tablet by mouth every morning ON AN EMPTY STOMACH 90 tablet 3  . mometasone (ELOCON) 0.1 % cream APPLY TO AFFECTED AREA ON CHIN AND ELBOWS TWICE A DAY UNTIL CLEAR, THEN AS NEEDED FOR FLARES  0  . nystatin (MYCOSTATIN/NYSTOP) 100000 UNIT/GM POWD Apply topically as directed twice daily (Patient taking differently: Apply topically as directed twice daily (as needed for irritation)) 60 g 3  . RA ASPIRIN 325 MG tablet take 1 tablet by mouth once daily 90 tablet 3  . valACYclovir (VALTREX) 500 MG tablet take 1 tablet by mouth twice a day (Patient taking differently: Take 1,000 mg by mouth every morning) 60 tablet 3   No current facility-administered medications for this visit.    General: no complaints  HEENT: no complaints  Lungs: no complaints  Cardiac: no complaints  GI: no complaints  GU: no complaints  Musculoskeletal: no complaints  Extremities: no complaints  Skin: no complaints  Neuro: no complaints  Endocrine: no complaints  Psych: no complaints       Objective:  Physical Examination:  BP 138/86   Pulse 68   Temp 99 F (37.2 C) (Tympanic)   Resp 18   Ht 5' 3.5" (1.613 m)   Wt 244 lb 6.4 oz (110.9 kg)   BMI 42.61 kg/m    Body mass index is 42.61 kg/m.    ECOG Performance Status: 0 - Asymptomatic  General appearance: alert, cooperative and appears stated age HEENT:PERRLA, extra ocular movement intact and sclera clear, anicteric Neurological exam reveals alert, oriented, normal speech, no focal findings Extremities: negative Abdomen: soft NDNT Lymphatic survery: negative for inguinal adenopathy Pelvic: exam chaperoned by nurse;  Vulva: vulvar scars after resection well healed. 2 small leukoplakic lesion on the left vulva at approximately 3 o'clock measuring about 5 mm.  Vagina: normal.  Bimanual: no masses. Cervix/uterus surgically  absent.  Vulvar Biopsy The risks and benefits of the procedure were reviewed and informed consent obtained.  Time out was performed. The patient received pre-procedure teaching and expressed understanding. The post-procedure instructions were reviewed with the patient and she expressed understanding. The patient does not have any barriers to learning. The largest and raised left lesion at 3 o'clock was biopsied. The area was numbed with hurricane jelly and injected with lidocaine. A biopsy forceps was used to obtained a biopsy.  Hemostasis was obtained with silver nitrate. She tolerated the procedure well.     Assessment:  ZELIE ASBILL is a 66 y.o. female diagnosed with recurrent Paget's disease of the vulva s/p WLE with positive margins with subsequent recurrence diagnosed 07/2015. Repeat WLE c/w recurrent Paget's disease of the vulva s/p WLE with negative margins and biopsy with rare CK7 + intradermal cells interpreted as Paget's disease, clinically asymptomatic. NED on exam for Paget's . Two leukoplakic lesions noted on left vulva, the largest representative lesion was biopsied.   H/o recurrent vulvar candidiasis, resolved after 6 month course of Diflucan, negative exam today.    Plan:   Problem List Items Addressed This Visit      Musculoskeletal and Integument   Paget disease, extra mammary - Primary (Chronic)     I recommended continued close surveillance for Paget's disease with repeat exam and visit in 6 months. If she develops gross evidence of recurrent Paget's disease, then we can consider [place]ara therapy.   Follow up vulvar biopsy today. These lesions appear more consistent with dysplasia than Paget's disease.    She will RTC in 6 months or sooner if symptoms or based on biopsy results.   Gillis Ends, MD

## 2017-05-16 NOTE — Progress Notes (Signed)
  Oncology Nurse Navigator Documentation Chaperoned pelvic exam. Vulva biopsy sent to pathology. Navigator Location: [facility] (05/16/17 0900)   )Navigator Encounter Type: Follow-up Appt (05/16/17 0900)                     Patient Visit Type: GynOnc (05/16/17 0900)                              Time Spent with Patient: 30 (05/16/17 0900)

## 2017-05-16 NOTE — Patient Instructions (Signed)
Vulva Biopsy, Care After These instructions give you information about caring for yourself after your procedure. Your doctor may also give you more specific instructions. Call your doctor if you have any problems or questions after your procedure. Follow these instructions at home: Biopsy Site Care   Do not rub the biopsy area after peeing (urinating). Gently: ? Pat the area dry. Or, use a bottle filled with warm water (peri-bottle) to clean the area. ? Wipe from front to back.  Follow instructions from your doctor about how to take care of your biopsy site. Make sure you: ? Clean the area using water and mild soap twice a day or as told by your doctor. Gently pat the area dry. ? If you were prescribed an antibiotic medical ointment, apply it as told by your doctor. Do not stop using the antibiotic even if your condition gets better. ? Take a warm water bath that is taken while you are sitting down (sitz bath). Do this as needed to help with pain.  Check your biopsy site every day for signs of infection. Check for: ? More redness, swelling, or pain. ? More fluid or blood. ? Warmth. ? Pus or a bad smell. Lifestyle  Wear loose, cotton underwear.  Do not wear tight pants.  Do not use a tampon, douche, or put anything in your vagina for at least one week or until your doctor says it is okay.  Do not have sex for at least one week or until your doctor says it is okay.  Do not exercise until your doctor says it is okay.  Do not take baths, swim, or use a hot tub until your doctor says it is okay. General instructions  Take over-the-counter and prescription medicines only as told by your doctor.  Use a sanitary pad until bleeding stops.  Keep all follow-up visits as told by your doctor. This is important.  If the sample is being sent for testing, it is your responsibility to get the results of your procedure. Ask your doctor or the department doing the procedure when your results  will be ready. Contact a doctor if:  You have more redness, swelling, or pain around your biopsy site.  You have more fluid or blood coming from your biopsy site.  Your biopsy site feels warm when you touch it.  Medicine does not help your pain. Get help right away if:  You have a lot of bleeding from the vulva.  You have pus or a bad smell coming from your biopsy site.  You have a fever.  You have lower belly pain. This information is not intended to replace advice given to you by your health care provider. Make sure you discuss any questions you have with your health care provider. Document Released: 02/16/2009 Document Revised: 04/27/2016 Document Reviewed: 10/11/2015 Elsevier Interactive Patient Education  [employer].

## 2017-05-17 ENCOUNTER — Ambulatory Visit (INDEPENDENT_AMBULATORY_CARE_PROVIDER_SITE_OTHER): Payer: BLUE CROSS/BLUE SHIELD | Admitting: Family Medicine

## 2017-05-17 ENCOUNTER — Encounter: Payer: Self-pay | Admitting: Family Medicine

## 2017-05-17 DIAGNOSIS — M545 Low back pain, unspecified: Secondary | ICD-10-CM

## 2017-05-17 DIAGNOSIS — I10 Essential (primary) hypertension: Secondary | ICD-10-CM

## 2017-05-17 DIAGNOSIS — N9089 Other specified noninflammatory disorders of vulva and perineum: Secondary | ICD-10-CM | POA: Diagnosis not present

## 2017-05-17 DIAGNOSIS — Z8673 Personal history of transient ischemic attack (TIA), and cerebral infarction without residual deficits: Secondary | ICD-10-CM | POA: Diagnosis not present

## 2017-05-17 DIAGNOSIS — E785 Hyperlipidemia, unspecified: Secondary | ICD-10-CM | POA: Diagnosis not present

## 2017-05-17 MED ORDER — AMLODIPINE BESYLATE 5 MG PO TABS: 5.0000 mg | ORAL_TABLET | Freq: Every day | ORAL | 3 refills | Status: DC

## 2017-05-17 NOTE — Patient Instructions (Addendum)
Nice to see you. Please start doing exercises for your back. If your back discomfort does not improve in the next several weeks please let us know so we can do imaging. Please follow up on the vulvar biopsy. If you have recurrent bleeding that you're unable to get stop please be reevaluated. You can place pressure on the area to help to get bleeding stopped. We will increase your amlodipine to 5 mg daily.   Back Exercises If you have pain in your back, do these exercises 2-3 times each day or as told by your doctor. When the pain goes away, do the exercises once each day, but repeat the steps more times for each exercise (do more repetitions). If you do not have pain in your back, do these exercises once each day or as told by your doctor. Exercises Single Knee to Chest  Do these steps 3-5 times in a row for each leg: 1. Lie on your back on a firm bed or the floor with your legs stretched out. 2. Bring one knee to your chest. 3. Hold your knee to your chest by grabbing your knee or thigh. 4. Pull on your knee until you feel a gentle stretch in your lower back. 5. Keep doing the stretch for 10-30 seconds. 6. Slowly let go of your leg and straighten it.  Pelvic Tilt  Do these steps 5-10 times in a row: 1. Lie on your back on a firm bed or the floor with your legs stretched out. 2. Bend your knees so they point up to the ceiling. Your feet should be flat on the floor. 3. Tighten your lower belly (abdomen) muscles to press your lower back against the floor. This will make your tailbone point up to the ceiling instead of pointing down to your feet or the floor. 4. Stay in this position for 5-10 seconds while you gently tighten your muscles and breathe evenly.  Cat-Cow  Do these steps until your lower back bends more easily: 1. Get on your hands and knees on a firm surface. Keep your hands under your shoulders, and keep your knees under your hips. You may put padding under your knees. 2. Let  your head hang down, and make your tailbone point down to the floor so your lower back is round like the back of a cat. 3. Stay in this position for 5 seconds. 4. Slowly lift your head and make your tailbone point up to the ceiling so your back hangs low (sags) like the back of a cow. 5. Stay in this position for 5 seconds.  Press-Ups  Do these steps 5-10 times in a row: 1. Lie on your belly (face-down) on the floor. 2. Place your hands near your head, about shoulder-width apart. 3. While you keep your back relaxed and keep your hips on the floor, slowly straighten your arms to raise the top half of your body and lift your shoulders. Do not use your back muscles. To make yourself more comfortable, you may change where you place your hands. 4. Stay in this position for 5 seconds. 5. Slowly return to lying flat on the floor.  Bridges  Do these steps 10 times in a row: 1. Lie on your back on a firm surface. 2. Bend your knees so they point up to the ceiling. Your feet should be flat on the floor. 3. Tighten your butt muscles and lift your butt off of the floor until your waist is almost as high as  your knees. If you do not feel the muscles working in your butt and the back of your thighs, slide your feet 1-2 inches farther away from your butt. 4. Stay in this position for 3-5 seconds. 5. Slowly lower your butt to the floor, and let your butt muscles relax.  If this exercise is too easy, try doing it with your arms crossed over your chest. Belly Crunches  Do these steps 5-10 times in a row: 1. Lie on your back on a firm bed or the floor with your legs stretched out. 2. Bend your knees so they point up to the ceiling. Your feet should be flat on the floor. 3. Cross your arms over your chest. 4. Tip your chin a little bit toward your chest but do not bend your neck. 5. Tighten your belly muscles and slowly raise your chest just enough to lift your shoulder blades a tiny bit off of the  floor. 6. Slowly lower your chest and your head to the floor.  Back Lifts Do these steps 5-10 times in a row: 1. Lie on your belly (face-down) with your arms at your sides, and rest your forehead on the floor. 2. Tighten the muscles in your legs and your butt. 3. Slowly lift your chest off of the floor while you keep your hips on the floor. Keep the back of your head in line with the curve in your back. Look at the floor while you do this. 4. Stay in this position for 3-5 seconds. 5. Slowly lower your chest and your face to the floor.  Contact a doctor if:  Your back pain gets a lot worse when you do an exercise.  Your back pain does not lessen 2 hours after you exercise. If you have any of these problems, stop doing the exercises. Do not do them again unless your doctor says it is okay. Get help right away if:  You have sudden, very bad back pain. If this happens, stop doing the exercises. Do not do them again unless your doctor says it is okay. This information is not intended to replace advice given to you by your health care provider. Make sure you discuss any questions you have with your health care provider. Document Released: 12/23/2010 Document Revised: 04/27/2016 Document Reviewed: 01/14/2015 Elsevier Interactive Patient Education  [employer].

## 2017-05-17 NOTE — Assessment & Plan Note (Signed)
Above goal given prior TIA. We'll increase her amlodipine to 5 mg. She'll start monitoring and if not improving at home she'll let us know.

## 2017-05-17 NOTE — Progress Notes (Signed)
  [name], MD Phone: [phone]  Bailey Brooks is a 66 y.o. female who presents today for f/u.  HYPERTENSION  Disease Monitoring  Home BP Monitoring not checking Chest pain- no    Dyspnea- no Medications  Compliance-  Taking amlodpine, chlorthalidone.  Edema- no  Hyperlipidemia: Taking Lipitor. No right upper quadrant pain or myalgias.  Still having some low back discomfort. Notes it's not as bad as it was. Hurts mostly in the morning after getting up. Has been doing Tylenol and a muscle relaxer. Did not start the exercises. No numbness, weakness, bowel or bladder incontinence, or saddle anesthesia.  TIA: She has seen neurology. She's noted no recurrent speech issues. No numbness or weakness. She is taking aspirin.  She saw her gynecologic oncologist yesterday. They biopsied a vulvar lesion. She did have some bleeding yesterday though it has stopped at this point. They were unsure whether this represented some other type of cancer, recurrence of her Paget's, or a benign lesion.  PMH: nonsmoker.   ROS see history of present illness  Objective  Physical Exam Vitals:   05/17/17 0808 05/17/17 0824  BP: 138/90 134/88  Pulse: 79   Temp: 98.7 F (37.1 C)     BP Readings from Last 3 Encounters:  05/17/17 134/88  05/16/17 138/86  05/15/17 132/84   Wt Readings from Last 3 Encounters:  05/17/17 244 lb 3.2 oz (110.8 kg)  05/16/17 244 lb 6.4 oz (110.9 kg)  05/15/17 245 lb (111.1 kg)    Physical Exam  Constitutional: No distress.  Cardiovascular: Normal rate, regular rhythm and normal heart sounds.   Pulmonary/Chest: Effort normal and breath sounds normal.  Musculoskeletal:  No midline spine tenderness, no midline spine step-off, no muscular back tenderness  Neurological: She is alert.  CN 2-12 intact, 5/5 strength in bilateral biceps, triceps, grip, quads, hamstrings, plantar and dorsiflexion, sensation to light touch intact in bilateral UE and LE, normal gait    Skin: Skin is warm and dry. She is not diaphoretic.     Assessment/Plan: Please see individual problem list.  Essential hypertension Above goal given prior TIA. We'll increase her amlodipine to 5 mg. She'll start monitoring and if not improving at home she'll let us know.  Hyperlipidemia Continue Lipitor. Work on diet and exercise for this.  Vulvar lesion Biopsy through gynecology yesterday. Offered recheck of area given bleeding yesterday though she deferred. She'll monitor for recurrent bleeding. Given bleeding return precautions.  Low back pain Somewhat better. Has not been doing exercises. Discussed imaging given persistence though she deferred and opted to try exercises at home. These were given to her again. If not improving over the next several weeks she will let us know so we can image the area.  History of transient ischemic attack (TIA) No recurrent symptoms. Continue to work on risk factor modification.   No orders of the defined types were placed in this encounter.   Meds ordered this encounter  Medications  . amLODipine (NORVASC) 5 MG tablet    Sig: Take 1 tablet (5 mg total) by mouth daily.    Dispense:  90 tablet    Refill:  [place], MD [place]

## 2017-05-17 NOTE — Assessment & Plan Note (Signed)
Continue Lipitor. Work on diet and exercise for this.

## 2017-05-17 NOTE — Assessment & Plan Note (Signed)
No recurrent symptoms. Continue to work on risk factor modification.

## 2017-05-17 NOTE — Assessment & Plan Note (Signed)
Biopsy through gynecology yesterday. Offered recheck of area given bleeding yesterday though she deferred. She'll monitor for recurrent bleeding. Given bleeding return precautions.

## 2017-05-17 NOTE — Assessment & Plan Note (Signed)
Somewhat better. Has not been doing exercises. Discussed imaging given persistence though she deferred and opted to try exercises at home. These were given to her again. If not improving over the next several weeks she will let us know so we can image the area.

## 2017-05-18 NOTE — Progress Notes (Signed)
I have reviewed and agreed above plan. 

## 2017-05-21 ENCOUNTER — Telehealth: Payer: Self-pay

## 2017-05-21 LAB — SURGICAL PATHOLOGY

## 2017-05-21 NOTE — Telephone Encounter (Signed)
  Oncology Nurse Navigator Documentation Notified of biopsy results. Has follow up arranged already for 6 months.  DIAGNOSIS:  A. VULVA; BIOPSY:  - SKIN WITH FOCAL KERATOSIS AND HYPERGRANULOSIS.  - NEGATIVE FOR DYSPLASIA AND MALIGNANCY.   Navigator Location: [facility] (05/21/17 1300)   )Navigator Encounter Type: Telephone;Diagnostic Results (05/21/17 1300)                                                    Time Spent with Patient: 15 (05/21/17 1300)

## 2017-06-11 ENCOUNTER — Other Ambulatory Visit: Payer: Self-pay

## 2017-06-11 ENCOUNTER — Other Ambulatory Visit: Payer: Self-pay | Admitting: Internal Medicine

## 2017-06-11 ENCOUNTER — Inpatient Hospital Stay: Payer: BLUE CROSS/BLUE SHIELD | Attending: Internal Medicine | Admitting: Internal Medicine

## 2017-06-11 ENCOUNTER — Inpatient Hospital Stay: Payer: BLUE CROSS/BLUE SHIELD

## 2017-06-11 DIAGNOSIS — Z9484 Stem cells transplant status: Secondary | ICD-10-CM | POA: Insufficient documentation

## 2017-06-11 DIAGNOSIS — C8333 Diffuse large B-cell lymphoma, intra-abdominal lymph nodes: Secondary | ICD-10-CM

## 2017-06-11 DIAGNOSIS — E039 Hypothyroidism, unspecified: Secondary | ICD-10-CM | POA: Diagnosis not present

## 2017-06-11 DIAGNOSIS — I509 Heart failure, unspecified: Secondary | ICD-10-CM

## 2017-06-11 DIAGNOSIS — Z853 Personal history of malignant neoplasm of breast: Secondary | ICD-10-CM | POA: Diagnosis not present

## 2017-06-11 DIAGNOSIS — E7800 Pure hypercholesterolemia, unspecified: Secondary | ICD-10-CM | POA: Insufficient documentation

## 2017-06-11 DIAGNOSIS — Z7982 Long term (current) use of aspirin: Secondary | ICD-10-CM | POA: Diagnosis not present

## 2017-06-11 DIAGNOSIS — N909 Noninflammatory disorder of vulva and perineum, unspecified: Secondary | ICD-10-CM | POA: Diagnosis not present

## 2017-06-11 DIAGNOSIS — Z885 Allergy status to narcotic agent status: Secondary | ICD-10-CM

## 2017-06-11 DIAGNOSIS — Z9221 Personal history of antineoplastic chemotherapy: Secondary | ICD-10-CM | POA: Diagnosis not present

## 2017-06-11 DIAGNOSIS — Z8673 Personal history of transient ischemic attack (TIA), and cerebral infarction without residual deficits: Secondary | ICD-10-CM

## 2017-06-11 DIAGNOSIS — C8330 Diffuse large B-cell lymphoma, unspecified site: Secondary | ICD-10-CM

## 2017-06-11 DIAGNOSIS — Z79899 Other long term (current) drug therapy: Secondary | ICD-10-CM | POA: Diagnosis not present

## 2017-06-11 DIAGNOSIS — K219 Gastro-esophageal reflux disease without esophagitis: Secondary | ICD-10-CM | POA: Diagnosis not present

## 2017-06-11 DIAGNOSIS — Z8572 Personal history of non-Hodgkin lymphomas: Secondary | ICD-10-CM | POA: Insufficient documentation

## 2017-06-11 DIAGNOSIS — Z8579 Personal history of other malignant neoplasms of lymphoid, hematopoietic and related tissues: Secondary | ICD-10-CM | POA: Insufficient documentation

## 2017-06-11 DIAGNOSIS — I110 Hypertensive heart disease with heart failure: Secondary | ICD-10-CM

## 2017-06-11 LAB — COMPREHENSIVE METABOLIC PANEL
ALT: 18 U/L (ref 14–54)
AST: 22 U/L (ref 15–41)
Albumin: 3.5 g/dL (ref 3.5–5.0)
Alkaline Phosphatase: 89 U/L (ref 38–126)
Anion gap: 8 (ref 5–15)
BILIRUBIN TOTAL: 0.5 mg/dL (ref 0.3–1.2)
BUN: 20 mg/dL (ref 6–20)
CHLORIDE: 100 mmol/L — AB (ref 101–111)
CO2: 29 mmol/L (ref 22–32)
CREATININE: 0.89 mg/dL (ref 0.44–1.00)
Calcium: 9.3 mg/dL (ref 8.9–10.3)
Glucose, Bld: 96 mg/dL (ref 65–99)
POTASSIUM: 3.6 mmol/L (ref 3.5–5.1)
Sodium: 137 mmol/L (ref 135–145)
TOTAL PROTEIN: 7.1 g/dL (ref 6.5–8.1)

## 2017-06-11 LAB — CBC WITH DIFFERENTIAL/PLATELET
Basophils Absolute: 0 10*3/uL (ref 0–0.1)
Basophils Relative: 1 %
EOS PCT: 1 %
Eosinophils Absolute: 0.1 10*3/uL (ref 0–0.7)
HEMATOCRIT: 39.9 % (ref 35.0–47.0)
Hemoglobin: 13.7 g/dL (ref 12.0–16.0)
LYMPHS ABS: 1.4 10*3/uL (ref 1.0–3.6)
LYMPHS PCT: 24 %
MCH: 29.8 pg (ref 26.0–34.0)
MCHC: 34.4 g/dL (ref 32.0–36.0)
MCV: 86.5 fL (ref 80.0–100.0)
MONO ABS: 0.5 10*3/uL (ref 0.2–0.9)
MONOS PCT: 8 %
Neutro Abs: 3.8 10*3/uL (ref 1.4–6.5)
Neutrophils Relative %: 66 %
PLATELETS: 229 10*3/uL (ref 150–440)
RBC: 4.61 MIL/uL (ref 3.80–5.20)
RDW: 14.9 % — AB (ref 11.5–14.5)
WBC: 5.8 10*3/uL (ref 3.6–11.0)

## 2017-06-11 LAB — LACTATE DEHYDROGENASE: LDH: 143 U/L (ref 98–192)

## 2017-06-11 NOTE — Progress Notes (Signed)
[place] OFFICE PROGRESS NOTE  Patient Care Team: Leone Haven, MD as PCP - General (Family Medicine) Clent Jacks, RN as Registered Nurse   SUMMARY OF ONCOLOGIC HISTORY:   No history exists.      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 A vulvar biopsy negative for malignancy  Denies any unusual shortness of breath cough or abdominal pain distention night sweats or weight loss. Patient knows to have increasing weight gain- which she attributes to her slightly elevated blood pressure today.   REVIEW OF SYSTEMS:  A complete 10 point review of system is done which is negative except mentioned above/history of present illness.   PAST MEDICAL HISTORY :  Past Medical History:  Diagnosis Date  . 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 Allergic rhinitis   . Arthritis   . CHF (congestive heart failure) ([place])   . Diastolic dysfunction    a. 11/2016 Echo: EF 60-65%, no rwma, mild LVH, Gr1 DD, triv MR, mildly dil LA, nl RV fxn.  . Diffuse large B cell lymphoma [facility] Nov 2011   Dr Inez Pilgrim, Dr. Madelynn Done s/p RCHOP and methotrexate, c/b renal failure  . Diverticulosis   . Esophagitis   . GERD (gastroesophageal reflux disease)   . H/O stem cell transplant ([place]) 06/2011   a. in setting of lymphoma.  . Hemorrhoids   . History of chemotherapy 22-Oct-2010   RHCOP/methotrexate-intrathecal  . History of stress test    a. 05/2010 Myoview: nl EF, no ischemia/infarct.  . Hypercholesterolemia   . Hypertension   . Hypothyroidism   . Morbid obesity ([place])   . Paget disease, extra mammary    vulva, s/p resection 2014, Dr. Sabra Heck  . TIA (transient ischemic attack)     PAST SURGICAL HISTORY :   Past  Surgical History:  Procedure Laterality Date  . ABDOMINAL HYSTERECTOMY  1985   Hysterectomy-partial  . BREAST BIOPSY  2002   Neg - AT [facility]  . BURR HOLE W/ PLACEMENT OMMAYA RESERVOIR    . BURR HOLE W/ PLACEMENT OMMAYA RESERVOIR  2012  . COLONOSCOPY  04/2013  . ESOPHAGOGASTRODUODENOSCOPY  04/2013  . INSERTION CENTRAL VENOUS ACCESS DEVICE W/ SUBCUTANEOUS PORT  2011   Port a Cath: Right chest Double Lumen, 04-Nov-2010  . LIMBAL STEM CELL TRANSPLANT  2012  . LIVER BIOPSY     stage 4B large Bcell lymphoma  . ROTATOR CUFF REPAIR Right 2016  . SHOULDER ARTHROSCOPY Right 06/22/2015   Procedure: ARTHROSCOPY SHOULDER, parital repair of rotator cuff, biceps tenodesis, decompression and debridement;  Surgeon: Corky Mull, MD;  Location: [facility] ORS;  Service: Orthopedics;  Laterality: Right;  . TONSILLECTOMY  1954  . VULVECTOMY    . VULVECTOMY PARTIAL N/A 09/29/2015   Procedure: VULVECTOMY PARTIAL;  Surgeon: Gillis Ends, MD;  Location: [facility] ORS;  Service: Gynecology;  Laterality: N/A;    FAMILY HISTORY :   Family History  Problem Relation Age of Onset  . Diabetes Mother        died @ 30 of MI.  Marland Kitchen Hypertension Mother   . Heart attack Mother   . Melanoma Father 53       died of complications r/t melanoma w/ lung mets.  . Kidney disease Sister  Kidney removed   . Prostate cancer Brother        Prostate - dx in 18's  . Heart disease Brother 29       reported MI @ age 26, ? treated w/ TPA->no recurrent CAD, now in 5's.  Marland Kitchen Hearing loss Maternal Aunt   . Hearing loss Maternal Grandfather     SOCIAL HISTORY:   Social History  Substance Use Topics  . Smoking status: Never Smoker  . Smokeless tobacco: Never Used  . Alcohol use No     Comment: former user-no current use    ALLERGIES:  is allergic to erythromycin; iodine; morphine and related; oxycodone; sulfa antibiotics; and adhesive [tape].  MEDICATIONS:  Current Outpatient Prescriptions  Medication Sig Dispense Refill   . acetaminophen (TYLENOL) 650 MG CR tablet Take 650 mg by mouth 2 (two) times daily.    Marland Kitchen amLODipine (NORVASC) 5 MG tablet Take 1 tablet (5 mg total) by mouth daily. 90 tablet 3  . atorvastatin (LIPITOR) 40 MG tablet Take 1 tablet (40 mg total) by mouth daily. 90 tablet 3  . chlorthalidone (HYGROTON) 25 MG tablet Take 12.5 mg by mouth daily.   0  . lansoprazole (PREVACID) 30 MG capsule take 1 capsule by mouth once daily 30 capsule 2  . levothyroxine (SYNTHROID, LEVOTHROID) 88 MCG tablet take 1 tablet by mouth every morning ON AN EMPTY STOMACH 90 tablet 3  . mometasone (ELOCON) 0.1 % cream APPLY TO AFFECTED AREA ON CHIN AND ELBOWS TWICE A DAY UNTIL CLEAR, THEN AS NEEDED FOR FLARES  0  . nystatin (MYCOSTATIN/NYSTOP) 100000 UNIT/GM POWD Apply topically as directed twice daily (Patient taking differently: Apply topically as directed twice daily (as needed for irritation)) 60 g 3  . RA ASPIRIN 325 MG tablet take 1 tablet by mouth once daily 90 tablet 3  . valACYclovir (VALTREX) 500 MG tablet take 1 tablet by mouth twice a day 60 tablet 3   No current facility-administered medications for this visit.     PHYSICAL EXAMINATION: ECOG PERFORMANCE STATUS: 0 - Asymptomatic  BP (!) 144/92 (BP Location: Right Arm, Patient Position: Sitting)   Temp 98.6 F (37 C) (Tympanic)   Wt 244 lb 6 oz (110.8 kg)   BMI 42.61 kg/m   Filed Weights   06/11/17 1201  Weight: 244 lb 6 oz (110.8 kg)    GENERAL: Well-nourished well-developed; Alert, no distress and comfortable.   Alone.  EYES: no pallor or icterus OROPHARYNX: no thrush or ulceration; good dentition  NECK: supple, no masses felt LYMPH:  no palpable lymphadenopathy in the cervical, axillary or inguinal regions LUNGS: clear to auscultation and  No wheeze or crackles HEART/CVS: regular rate & rhythm and no murmurs; No lower extremity edema ABDOMEN:abdomen soft, non-tender and normal bowel sounds Musculoskeletal:no cyanosis of digits and no clubbing   PSYCH: alert & oriented x 3 with fluent speech NEURO: no focal motor/sensory deficits SKIN:  no rashes or significant lesions; patient has O[place]aya port.  LABORATORY DATA:  I have reviewed the data as listed    Component Value Date/Time   NA 137 06/11/2017 1137   NA 138 09/03/2013 0809   K 3.6 06/11/2017 1137   K 4.3 11/02/2014 0932   CL 100 (L) 06/11/2017 1137   CL 105 09/03/2013 0809   CO2 29 06/11/2017 1137   CO2 28 09/03/2013 0809   GLUCOSE 96 06/11/2017 1137   GLUCOSE 100 (H) 09/03/2013 0809   BUN 20 06/11/2017 1137   BUN 17  09/03/2013 0809   CREATININE 0.89 06/11/2017 1137   CREATININE 0.96 11/02/2014 0929   CALCIUM 9.3 06/11/2017 1137   CALCIUM 9.1 09/03/2013 0809   PROT 7.1 06/11/2017 1137   PROT 6.6 11/02/2014 0929   ALBUMIN 3.5 06/11/2017 1137   ALBUMIN 3.3 (L) 11/02/2014 0929   AST 22 06/11/2017 1137   AST 16 11/02/2014 0929   ALT 18 06/11/2017 1137   ALT 19 11/02/2014 0929   ALKPHOS 89 06/11/2017 1137   ALKPHOS 87 11/02/2014 0929   BILITOT 0.5 06/11/2017 1137   BILITOT 0.3 11/02/2014 0929   GFRNONAA >60 06/11/2017 1137   GFRNONAA >60 11/02/2014 0929   GFRNONAA >60 07/27/2014 0836   GFRAA >60 06/11/2017 1137   GFRAA >60 11/02/2014 0929   GFRAA >60 07/27/2014 0836    No results found for: SPEP, UPEP  Lab Results  Component Value Date   WBC 5.8 06/11/2017   NEUTROABS 3.8 06/11/2017   HGB 13.7 06/11/2017   HCT 39.9 06/11/2017   MCV 86.5 06/11/2017   PLT 229 06/11/2017      Chemistry      Component Value Date/Time   NA 137 06/11/2017 1137   NA 138 09/03/2013 0809   K 3.6 06/11/2017 1137   K 4.3 11/02/2014 0932   CL 100 (L) 06/11/2017 1137   CL 105 09/03/2013 0809   CO2 29 06/11/2017 1137   CO2 28 09/03/2013 0809   BUN 20 06/11/2017 1137   BUN 17 09/03/2013 0809   CREATININE 0.89 06/11/2017 1137   CREATININE 0.96 11/02/2014 0929      Component Value Date/Time   CALCIUM 9.3 06/11/2017 1137   CALCIUM 9.1 09/03/2013 0809   ALKPHOS 89  06/11/2017 1137   ALKPHOS 87 11/02/2014 0929   AST 22 06/11/2017 1137   AST 16 11/02/2014 0929   ALT 18 06/11/2017 1137   ALT 19 11/02/2014 0929   BILITOT 0.5 06/11/2017 1137   BILITOT 0.3 11/02/2014 0929       RADIOGRAPHIC STUDIES: I have personally reviewed the radiological images as listed and agreed with the findings in the report. No results found.   ASSESSMENT & PLAN:   Diffuse large b-cell lymphoma, intra-abdominal lymph nodes ([place]) # DLBCL- Stage IV status post autologous symptoms of transplant in 2012. Clinically no evidence of recurrence. CBC/CMP/LDH- normal.   # paget's disease of Vulva- status post recent Bx [june 2018]- neg for malignancy. Continued follow up with gynecology oncology.  # Hypokalemia/hypomagnesemia- resolved.  # Elevated Blood pressure-? 's random versus persistent. ? secondary to weight gain/  recommend checking blood pressure at home; take the log PCP  # Patient follow-up with me in one year with CBC CMP magnesium.      Cammie Sickle, MD 06/11/2017 6:47 PM

## 2017-06-11 NOTE — Assessment & Plan Note (Addendum)
#   DLBCL- Stage IV status post autologous symptoms of transplant in 2012. Clinically no evidence of recurrence. CBC/CMP/LDH- normal.   # paget's disease of Vulva- status post recent Bx [june 2018]- neg for malignancy. Continued follow up with gynecology oncology.  # Hypokalemia/hypomagnesemia- resolved.  # Elevated Blood pressure-? 's random versus persistent. ? secondary to weight gain/  recommend checking blood pressure at home; take the log PCP  # Patient follow-up with me in one year with CBC CMP magnesium.

## 2017-07-19 ENCOUNTER — Other Ambulatory Visit: Payer: Self-pay | Admitting: Family Medicine

## 2017-08-20 ENCOUNTER — Encounter: Payer: Self-pay | Admitting: Family Medicine

## 2017-08-20 ENCOUNTER — Ambulatory Visit (INDEPENDENT_AMBULATORY_CARE_PROVIDER_SITE_OTHER): Payer: BLUE CROSS/BLUE SHIELD | Admitting: Family Medicine

## 2017-08-20 VITALS — BP 130/82 | HR 71 | Temp 97.7°F | Wt 245.0 lb

## 2017-08-20 DIAGNOSIS — Z1239 Encounter for other screening for malignant neoplasm of breast: Secondary | ICD-10-CM

## 2017-08-20 DIAGNOSIS — Z8572 Personal history of non-Hodgkin lymphomas: Secondary | ICD-10-CM

## 2017-08-20 DIAGNOSIS — C4499 Other specified malignant neoplasm of skin, unspecified: Secondary | ICD-10-CM | POA: Diagnosis not present

## 2017-08-20 DIAGNOSIS — E039 Hypothyroidism, unspecified: Secondary | ICD-10-CM | POA: Diagnosis not present

## 2017-08-20 DIAGNOSIS — I10 Essential (primary) hypertension: Secondary | ICD-10-CM

## 2017-08-20 DIAGNOSIS — Z1231 Encounter for screening mammogram for malignant neoplasm of breast: Secondary | ICD-10-CM | POA: Diagnosis not present

## 2017-08-20 LAB — TSH: TSH: 2.02 u[IU]/mL (ref 0.35–4.50)

## 2017-08-20 MED ORDER — CHLORTHALIDONE 25 MG PO TABS: 12.5000 mg | ORAL_TABLET | Freq: Every day | ORAL | 3 refills | Status: DC

## 2017-08-20 NOTE — Patient Instructions (Signed)
Nice to see you. Please continue to monitor blood pressure. We'll check your TSH today. Please continue to follow with your specialists for your Paget's and history of lymphoma.

## 2017-08-20 NOTE — Assessment & Plan Note (Signed)
Patient with history of lymphoma. Final oncology. Recent reviewed. Lab work reviewed as well. She'll follow up with them as scheduled.

## 2017-08-20 NOTE — Assessment & Plan Note (Signed)
Due for TSH today. Continue Synthroid.

## 2017-08-20 NOTE — Progress Notes (Signed)
  [name], MD Phone: [phone]  Bailey Brooks is a 66 y.o. female who presents today for follow-up.  Hypertension: Patient notes blood pressure running in the 120s over 80s at home. Taking amlodipine and chlorthalidone. No chest pain or shortness of breath. Rare edema in her ankles if she sits for long periods of time at work. No orthopnea.  Hypothyroidism: Taking Synthroid. Notes chronic cold intolerance. No heat intolerance. No skin changes. Weight is stable over the last several months.  History of lymphoma: Has been doing well. Follow with oncology 1 time per year. Saw them in July and had reassuring lab work. Has had no recurrence following treatment. Last note reviewed.  Paget's disease, extramammary: Has done well with this as well. Following with gynecology oncology. A biopsy at her last visit that was negative. Last note reviewed. They plan to follow with her every 6 months. She's not had any recurrence.  PMH: nonsmoker.   ROS see history of present illness  Objective  Physical Exam Vitals:   08/20/17 0801 08/20/17 0818  BP: 140/80 130/82  Pulse: 71   Temp: 97.7 F (36.5 C)   SpO2: 93%     BP Readings from Last 3 Encounters:  08/20/17 130/82  06/11/17 (!) 144/92  05/17/17 134/88   Wt Readings from Last 3 Encounters:  08/20/17 245 lb (111.1 kg)  06/11/17 244 lb 6 oz (110.8 kg)  05/17/17 244 lb 3.2 oz (110.8 kg)    Physical Exam  Constitutional: No distress.  HENT:  Head: Normocephalic and atraumatic.  Mouth/Throat: Oropharynx is clear and moist. No oropharyngeal exudate.  Neck: No thyromegaly present.  Cardiovascular: Normal rate, regular rhythm and normal heart sounds.   Pulmonary/Chest: Effort normal and breath sounds normal.  Musculoskeletal: She exhibits no edema.  Neurological: She is alert. Gait normal.  Skin: She is not diaphoretic.     Assessment/Plan: Please see individual problem list.  Essential hypertension Well-controlled  at home. Improved on recheck. She'll continue to monitor at home. Discussed goal of less than 130/90.  Hypothyroidism Due for TSH today. Continue Synthroid.  Paget disease, extra mammary Status post surgical resection. Following with GYN-ONC. Continue to follow with them as scheduled.  Personal history of lymphoma Patient with history of lymphoma. Final oncology. Recent reviewed. Lab work reviewed as well. She'll follow up with them as scheduled.   Orders Placed This Encounter  Procedures  . MM SCREENING BREAST TOMO BILATERAL    Standing Status:   Future    Standing Expiration Date:   10/20/2018    Scheduling Instructions:     Patient has them done at [place].    Order Specific Question:   Reason for Exam (SYMPTOM  OR DIAGNOSIS REQUIRED)    Answer:   breast cancer screening    Order Specific Question:   Preferred imaging location?    Answer:   External  . TSH    Meds ordered this encounter  Medications  . chlorthalidone (HYGROTON) 25 MG tablet    Sig: Take 0.5 tablets (12.5 mg total) by mouth daily.    Dispense:  90 tablet    Refill:  Lithium, MD [place]

## 2017-08-20 NOTE — Assessment & Plan Note (Signed)
Status post surgical resection. Following with GYN-ONC. Continue to follow with them as scheduled.

## 2017-08-20 NOTE — Assessment & Plan Note (Signed)
Well-controlled at home. Improved on recheck. She'll continue to monitor at home. Discussed goal of less than 130/90.

## 2017-09-12 NOTE — Progress Notes (Signed)
[facility]  PATIENT: Bailey Brooks DOB: [birth year]   REASON FOR VISIT:  follow-up for stroke/TIA HISTORY FROM: Patient    HISTORY OF PRESENT ILLNESS:FROM RECORDThis is a 30-yo RH woman who presented to the ED today after she experienced some difficulty with her speech. History is from the patient who is an excellent historian.   She reports that she was trying to speak to her husband this morning when she noticed that she was having trouble with her words. She states that her language was nonsensical--she was using real words but they were not what she wanted to say. She then tried to text her daughter-in-law and again noted that her language did not make any sense. She also noticed some numbness in her right hand. These symptoms lasted for a few minutes and then fully resolved. She denies any headache, vision loss, double vision, slurred speech, weakness, incoordination, or gait changes.  She states that she had a similar episode on 2011 or 2012 where she also had trouble getting the right words out. She was receiving chemotherapy for lymphoma at the time. She recalls having several tests but says they were not able to find out what caused her symptoms and ultimately blamed in to the chemo.  MRI of the brain no acute intracranial infarct. MRA normal. Carotid Doppler unremarkable. 2-D echo EF 60-65%. Hemoglobin A1c 5.6 LDL was 66 she was not on aspirin prior to admission Interval history 06/12/2018CM Bailey Brooks, returns to the stroke clinic today for hospital follow-up. She remains on aspirin for secondary stroke prevention without further stroke or TIA symptoms she has minimal bruising. 30 day event monitoring showed normal sinus rhythm without  atrial fibrillation or other arrhythmias. Blood pressure in the office today 132/84. She is on Lipitor for hyperlipidemia without myalgias. She is being seen for chronic hip and back pain by her primary care. She gets no regular  exercise. And she is obese. She returns for reevaluation UPDATE 10/11/2018CM Bailey Brooks, 66 year old female returns for follow-up with history of TIA. She remains on aspirin for secondary stroke prevention without further stroke or TIA symptoms. She has no bleeding and minimal bruising. She is on Lipitor without complaints of myalgias. She reports that her chronic back pain are much better she gets no regular exercise and was encouraged to do so cardiac event monitoring is without atrial fibrillation or other arrhythmias. Blood pressure in the office today 149/84 however was in normal range and she was seen by her primary care last month. She says she was anxious about this appointment. She returns for follow-up without neurologic complaints    REVIEW OF SYSTEMS: Full 14 system review of systems performed and notable only for those listed, all others are neg:  Constitutional: neg  Cardiovascular: neg Ear/Nose/Throat: neg  Skin: neg Eyes: neg Respiratory: neg Gastroitestinal: neg  Hematology/Lymphatic: neg  Endocrine: neg Musculoskeletal:neg Allergy/Immunology: neg Neurological: Spell of numbness and speech difficulty on 3/5 10 Psychiatric: neg Sleep : neg   ALLERGIES: Allergies  Allergen Reactions  . Erythromycin Nausea And Vomiting  . Iodine Swelling    IV iodine (states now that this can be tolerated with Benadryl)  . Morphine And Related Nausea And Vomiting and Nausea Only    Hallucinations (also)  . Oxycodone Nausea And Vomiting  . Sulfa Antibiotics Other (See Comments)    Dizzy/Fainting  . Adhesive [Tape] Rash and Other (See Comments)    Including Bandaids    HOME MEDICATIONS: Outpatient Medications Prior to Visit  Medication Sig Dispense Refill  . acetaminophen (TYLENOL) 650 MG CR tablet Take 650 mg by mouth 2 (two) times daily.    Marland Kitchen amLODipine (NORVASC) 5 MG tablet Take 1 tablet (5 mg total) by mouth daily. 90 tablet 3  . atorvastatin (LIPITOR) 40 MG tablet Take 1  tablet (40 mg total) by mouth daily. 90 tablet 3  . chlorthalidone (HYGROTON) 25 MG tablet Take 0.5 tablets (12.5 mg total) by mouth daily. 90 tablet 3  . lansoprazole (PREVACID) 30 MG capsule take 1 capsule by mouth once daily 30 capsule 2  . levothyroxine (SYNTHROID, LEVOTHROID) 88 MCG tablet take 1 tablet by mouth every morning ON AN EMPTY STOMACH 90 tablet 3  . mometasone (ELOCON) 0.1 % cream APPLY TO AFFECTED AREA ON CHIN AND ELBOWS TWICE A DAY UNTIL CLEAR, THEN AS NEEDED FOR FLARES  0  . RA ASPIRIN 325 MG tablet take 1 tablet by mouth once daily 90 tablet 3  . valACYclovir (VALTREX) 500 MG tablet take 1 tablet by mouth twice a day 60 tablet 3  . nystatin (MYCOSTATIN/NYSTOP) 100000 UNIT/GM POWD Apply topically as directed twice daily (Patient taking differently: Apply topically as directed twice daily (as needed for irritation)) 60 g 3   No facility-administered medications prior to visit.     PAST MEDICAL HISTORY: Past Medical History:  Diagnosis Date  . 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 Allergic rhinitis   . Arthritis   . CHF (congestive heart failure) ([place])   . Diastolic dysfunction    a. 11/2016 Echo: EF 60-65%, no rwma, mild LVH, Gr1 DD, triv MR, mildly dil LA, nl RV fxn.  . 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 Madelynn Done s/p RCHOP and methotrexate, c/b renal failure  . Diverticulosis   . Esophagitis   . GERD (gastroesophageal reflux disease)   . H/O stem cell transplant ([place]) 06/2011   a. in setting of lymphoma.  . Hemorrhoids   . History of chemotherapy 22-Oct-2010   RHCOP/methotrexate-intrathecal  . History of stress test    a. 05/2010 Myoview: nl EF, no ischemia/infarct.  . Hypercholesterolemia   . Hypertension   . Hypothyroidism   . Morbid obesity ([place])   . Paget disease, extra mammary    vulva, s/p resection 2014, Dr. Sabra Heck  . TIA (transient ischemic attack)     PAST  SURGICAL HISTORY: Past Surgical History:  Procedure Laterality Date  . ABDOMINAL HYSTERECTOMY  1985   Hysterectomy-partial  . BREAST BIOPSY  2002   Neg - AT [facility]  . BURR HOLE W/ PLACEMENT OMMAYA RESERVOIR    . BURR HOLE W/ PLACEMENT OMMAYA RESERVOIR  2012  . COLONOSCOPY  04/2013  . ESOPHAGOGASTRODUODENOSCOPY  04/2013  . INSERTION CENTRAL VENOUS ACCESS DEVICE W/ SUBCUTANEOUS PORT  2011   Port a Cath: Right chest Double Lumen, 04-Nov-2010  . LIMBAL STEM CELL TRANSPLANT  2012  . LIVER BIOPSY     stage 4B large Bcell lymphoma  . ROTATOR CUFF REPAIR Right 2016  . SHOULDER ARTHROSCOPY Right 06/22/2015   Procedure: ARTHROSCOPY SHOULDER, parital repair of rotator cuff, biceps tenodesis, decompression and debridement;  Surgeon: Corky Mull, MD;  Location: [facility] ORS;  Service: Orthopedics;  Laterality: Right;  . TONSILLECTOMY  1954  . VULVECTOMY    . VULVECTOMY PARTIAL N/A 09/29/2015   Procedure: VULVECTOMY PARTIAL;  Surgeon: Gillis Ends, MD;  Location: [facility] ORS;  Service: Gynecology;  Laterality: N/A;    FAMILY HISTORY: Family History  Problem Relation Age of Onset  . Diabetes Mother        died @ 32 of MI.  Marland Kitchen Hypertension Mother   . Heart attack Mother   . Melanoma Father 24       died of complications r/t melanoma w/ lung mets.  . Kidney disease Sister        Kidney removed   . Prostate cancer Brother        Prostate - dx in 35's  . Heart disease Brother 69       reported MI @ age 7, ? treated w/ TPA->no recurrent CAD, now in 81's.  Marland Kitchen Hearing loss Maternal Aunt   . Hearing loss Maternal Grandfather     SOCIAL HISTORY: Social History   Social History  . Marital status: Married    Spouse name: N/A  . Number of children: 2  . Years of education: N/A   Occupational History  . 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 Smoking status: Never Smoker  . Smokeless tobacco: Never Used  . Alcohol use No     Comment: former  user-no current use  . Drug use: No  . Sexual activity: Yes    Partners: Male    Birth control/ protection: Post-menopausal   Other Topics Concern  . Not on file   Social History Narrative   Lives in [place] with husband, has 2 grown sons.  No pets. 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   Right-handed   Caffeine: occasional caffeine free/diet soda or hot tea     PHYSICAL EXAM  Vitals:   09/13/17 0736  BP: (!) 149/84  Pulse: 72  Weight: 249 lb (112.9 kg)  Height: 5\' 3"  (1.6 m)   Body mass index is 44.11 kg/m.  Generalized: Well developed, Obese female in no acute distress  Head: normocephalic and atraumatic,. Oropharynx benign  Neck: Supple, no carotid bruits  Cardiac: Regular rate rhythm, no murmur  Musculoskeletal: No deformity   Neurological examination   Mentation: Alert oriented to time, place, history taking. Attention span and concentration appropriate. Recent and remote memory intact.  Follows all commands speech and language fluent.   Cranial nerve II-XII: Pupils were equal round reactive to light extraocular movements were full, visual field were full on confrontational test. Facial sensation and strength were normal. hearing was intact to finger rubbing bilaterally. Uvula tongue midline. head turning and shoulder shrug were normal and symmetric.Tongue protrusion into cheek strength was normal. Motor: normal bulk and tone, full strength in the BUE, BLE, fine finger movements normal, no pronator drift. No focal weakness Sensory: normal and symmetric to light touch,  in the upper and lower extremities Coordination: finger-nose-finger, heel-to-shin bilaterally, no dysmetria, no tremor Reflexes: 1+ upper lower and symmetric, plantar responses were flexor bilaterally. Gait and Station: Rising up from seated position without assistance, normal stance,  moderate stride, good arm swing, smooth turning, able to perform tiptoe, and heel walking without difficulty. Tandem gait is steady.  No assistive device   DIAGNOSTIC DATA (LABS, IMAGING, TESTING) - I reviewed patient records, labs, notes, testing and imaging myself where available.  Lab Results  Component Value Date   WBC 5.8 06/11/2017   HGB 13.7 06/11/2017   HCT 39.9 06/11/2017   MCV 86.5 06/11/2017   PLT 229 06/11/2017      Component Value Date/Time   NA 137 06/11/2017 1137  NA 138 09/03/2013 0809   K 3.6 06/11/2017 1137   K 4.3 11/02/2014 0932   CL 100 (L) 06/11/2017 1137   CL 105 09/03/2013 0809   CO2 29 06/11/2017 1137   CO2 28 09/03/2013 0809   GLUCOSE 96 06/11/2017 1137   GLUCOSE 100 (H) 09/03/2013 0809   BUN 20 06/11/2017 1137   BUN 17 09/03/2013 0809   CREATININE 0.89 06/11/2017 1137   CREATININE 0.96 11/02/2014 0929   CALCIUM 9.3 06/11/2017 1137   CALCIUM 9.1 09/03/2013 0809   PROT 7.1 06/11/2017 1137   PROT 6.6 11/02/2014 0929   ALBUMIN 3.5 06/11/2017 1137   ALBUMIN 3.3 (L) 11/02/2014 0929   AST 22 06/11/2017 1137   AST 16 11/02/2014 0929   ALT 18 06/11/2017 1137   ALT 19 11/02/2014 0929   ALKPHOS 89 06/11/2017 1137   ALKPHOS 87 11/02/2014 0929   BILITOT 0.5 06/11/2017 1137   BILITOT 0.3 11/02/2014 0929   GFRNONAA >60 06/11/2017 1137   GFRNONAA >60 11/02/2014 0929   GFRNONAA >60 07/27/2014 0836   GFRAA >60 06/11/2017 1137   GFRAA >60 11/02/2014 0929   GFRAA >60 07/27/2014 0836   Lab Results  Component Value Date   CHOL 240 (H) 02/11/2017   HDL 30 (L) 02/11/2017   LDLCALC 166 (H) 02/11/2017   LDLDIRECT 88.0 04/02/2017   TRIG 219 (H) 02/11/2017   CHOLHDL 8.0 02/11/2017   Lab Results  Component Value Date   HGBA1C 5.6 02/11/2017    Lab Results  Component Value Date   TSH 2.02 08/20/2017     ASSESSMENT AND PLAN  66 y.o. year old female  has a past medical history of Adult pulmonary Langerhans cell histiocytosis ([place]); Diffuse large B cell lymphoma [facility]) (Nov 2011); Hypercholesterolemia; Hypertension; Hypothyroidism; Morbid obesity ([place]); and Paget disease, here for   follow-up for TIA in March 2018. MRI of the brain no acute intracranial infarct. MRA normal. Carotid Doppler unremarkable. 2-D echo EF 60-65%. Hemoglobin A1c 5.6 LDL was 66 she was not on aspirin prior to admission. The patient is a current patient of Dr. Erlinda Hong  who is out of the office today . This note is sent to the work in doctor.      PLAN: Stressed the importance of management of risk factors to prevent further stroke Continue aspirin for secondary stroke prevention Maintain strict control of hypertension with blood pressure goal below 130/90, continue antihypertensive medications Cholesterol with LDL cholesterol less than 70, followed by primary care,  continue Lipitor Exercise by walking, 30 min daily  eat healthy diet with whole grains,  fresh fruits and vegetables Discharge from stroke clinic I spent 20 min in total face to face time with the patient more than 50% of which was spent counseling and coordination of care, reviewing test results reviewing medications and discussing and reviewing the diagnosis of stroke/TIA and management of risk factors. She will continue to follow-up with her primary care regarding risk factor management Dennie Bible, [facility], APRN  [facility] [address] [place], [place] [address] [phone]

## 2017-09-13 ENCOUNTER — Encounter: Payer: Self-pay | Admitting: Nurse Practitioner

## 2017-09-13 ENCOUNTER — Ambulatory Visit (INDEPENDENT_AMBULATORY_CARE_PROVIDER_SITE_OTHER): Payer: BLUE CROSS/BLUE SHIELD | Admitting: Nurse Practitioner

## 2017-09-13 VITALS — BP 149/84 | HR 72 | Ht 63.0 in | Wt 249.0 lb

## 2017-09-13 DIAGNOSIS — Z8673 Personal history of transient ischemic attack (TIA), and cerebral infarction without residual deficits: Secondary | ICD-10-CM

## 2017-09-13 DIAGNOSIS — E785 Hyperlipidemia, unspecified: Secondary | ICD-10-CM

## 2017-09-13 DIAGNOSIS — I10 Essential (primary) hypertension: Secondary | ICD-10-CM | POA: Diagnosis not present

## 2017-09-13 NOTE — Patient Instructions (Addendum)
Stressed the importance of management of risk factors to prevent further stroke Continue aspirin for secondary stroke prevention Maintain strict control of hypertension with blood pressure goal below 130/90, continue antihypertensive medications Cholesterol with LDL cholesterol less than 70, followed by primary care,  continue Lipitor Exercise by walking, 30 min daily  eat healthy diet with whole grains,  fresh fruits and vegetables Discharge from stroke clinic Stroke Prevention Some medical conditions and behaviors are associated with an increased chance of having a stroke. You may prevent a stroke by making healthy choices and managing medical conditions. How can I reduce my risk of having a stroke?  Stay physically active. Get at least 30 minutes of activity on most or all days.  Do not smoke. It may also be helpful to avoid exposure to secondhand smoke.  Limit alcohol use. Moderate alcohol use is considered to be: ? No more than 2 drinks per day for men. ? No more than 1 drink per day for nonpregnant women.  Eat healthy foods. This involves: ? Eating 5 or more servings of fruits and vegetables a day. ? Making dietary changes that address high blood pressure (hypertension), high cholesterol, diabetes, or obesity.  Manage your cholesterol levels. ? Making food choices that are high in fiber and low in saturated fat, trans fat, and cholesterol may control cholesterol levels. ? Take any prescribed medicines to control cholesterol as directed by your health care provider.  Manage your diabetes. ? Controlling your carbohydrate and sugar intake is recommended to manage diabetes. ? Take any prescribed medicines to control diabetes as directed by your health care provider.  Control your hypertension. ? Making food choices that are low in salt (sodium), saturated fat, trans fat, and cholesterol is recommended to manage hypertension. ? Ask your health care provider if you need treatment to  lower your blood pressure. Take any prescribed medicines to control hypertension as directed by your health care provider. ? If you are 79-21 years of age, have your blood pressure checked every 3-5 years. If you are 19 years of age or older, have your blood pressure checked every year.  Maintain a healthy weight. ? Reducing calorie intake and making food choices that are low in sodium, saturated fat, trans fat, and cholesterol are recommended to manage weight.  Stop drug abuse.  Avoid taking birth control pills. ? Talk to your health care provider about the risks of taking birth control pills if you are over 52 years old, smoke, get migraines, or have ever had a blood clot.  Get evaluated for sleep disorders (sleep apnea). ? Talk to your health care provider about getting a sleep evaluation if you snore a lot or have excessive sleepiness.  Take medicines only as directed by your health care provider. ? For some people, aspirin or blood thinners (anticoagulants) are helpful in reducing the risk of forming abnormal blood clots that can lead to stroke. If you have the irregular heart rhythm of atrial fibrillation, you should be on a blood thinner unless there is a good reason you cannot take them. ? Understand all your medicine instructions.  Make sure that other conditions (such as anemia or atherosclerosis) are addressed. Get help right away if:  You have sudden weakness or numbness of the face, arm, or leg, especially on one side of the body.  Your face or eyelid droops to one side.  You have sudden confusion.  You have trouble speaking (aphasia) or understanding.  You have sudden trouble seeing in  one or both eyes.  You have sudden trouble walking.  You have dizziness.  You have a loss of balance or coordination.  You have a sudden, severe headache with no known cause.  You have new chest pain or an irregular heartbeat. Any of these symptoms may represent a serious problem  that is an emergency. Do not wait to see if the symptoms will go away. Get medical help at once. Call your local emergency services (911 in [place] Do not drive yourself to the hospital. This information is not intended to replace advice given to you by your health care provider. Make sure you discuss any questions you have with your health care provider. Document Released: 12/28/2004 Document Revised: 04/27/2016 Document Reviewed: 05/23/2013 Elsevier Interactive Patient Education  2017 [employer].

## 2017-10-05 DIAGNOSIS — Z1231 Encounter for screening mammogram for malignant neoplasm of breast: Secondary | ICD-10-CM | POA: Diagnosis not present

## 2017-10-05 LAB — HM MAMMOGRAPHY

## 2017-10-08 ENCOUNTER — Encounter: Payer: Self-pay | Admitting: Family Medicine

## 2017-10-22 ENCOUNTER — Other Ambulatory Visit: Payer: Self-pay | Admitting: Family Medicine

## 2017-11-10 ENCOUNTER — Other Ambulatory Visit: Payer: Self-pay | Admitting: Family Medicine

## 2017-11-14 ENCOUNTER — Ambulatory Visit: Payer: BLUE CROSS/BLUE SHIELD

## 2017-12-14 ENCOUNTER — Ambulatory Visit: Payer: BLUE CROSS/BLUE SHIELD | Admitting: Family Medicine

## 2017-12-14 ENCOUNTER — Encounter: Payer: Self-pay | Admitting: Family Medicine

## 2017-12-14 VITALS — BP 116/80 | HR 71 | Temp 97.8°F | Wt 244.8 lb

## 2017-12-14 DIAGNOSIS — I10 Essential (primary) hypertension: Secondary | ICD-10-CM

## 2017-12-14 DIAGNOSIS — R252 Cramp and spasm: Secondary | ICD-10-CM | POA: Diagnosis not present

## 2017-12-14 DIAGNOSIS — S60222A Contusion of left hand, initial encounter: Secondary | ICD-10-CM

## 2017-12-14 DIAGNOSIS — R7309 Other abnormal glucose: Secondary | ICD-10-CM

## 2017-12-14 DIAGNOSIS — E039 Hypothyroidism, unspecified: Secondary | ICD-10-CM

## 2017-12-14 DIAGNOSIS — T148XXA Other injury of unspecified body region, initial encounter: Secondary | ICD-10-CM

## 2017-12-14 DIAGNOSIS — E785 Hyperlipidemia, unspecified: Secondary | ICD-10-CM | POA: Diagnosis not present

## 2017-12-14 HISTORY — DX: Other injury of unspecified body region, initial encounter: T14.8XXA

## 2017-12-14 LAB — COMPREHENSIVE METABOLIC PANEL
ALT: 13 U/L (ref 0–35)
AST: 17 U/L (ref 0–37)
Albumin: 3.7 g/dL (ref 3.5–5.2)
Alkaline Phosphatase: 92 U/L (ref 39–117)
BILIRUBIN TOTAL: 0.5 mg/dL (ref 0.2–1.2)
BUN: 17 mg/dL (ref 6–23)
CHLORIDE: 99 meq/L (ref 96–112)
CO2: 32 meq/L (ref 19–32)
CREATININE: 0.85 mg/dL (ref 0.40–1.20)
Calcium: 9.4 mg/dL (ref 8.4–10.5)
GFR: 70.72 mL/min (ref >60.00)
GLUCOSE: 104 mg/dL — AB (ref 70–99)
Potassium: 3.4 mEq/L — ABNORMAL LOW (ref 3.5–5.1)
SODIUM: 140 meq/L (ref 135–145)
Total Protein: 6.9 g/dL (ref 6.0–8.3)

## 2017-12-14 LAB — CBC WITH DIFFERENTIAL/PLATELET
BASOS PCT: 0.5 % (ref 0.0–3.0)
Basophils Absolute: 0 10*3/uL (ref 0.0–0.1)
EOS PCT: 1.2 % (ref 0.0–5.0)
Eosinophils Absolute: 0.1 10*3/uL (ref 0.0–0.7)
HEMATOCRIT: 41.6 % (ref 36.0–46.0)
Hemoglobin: 13.8 g/dL (ref 12.0–15.0)
LYMPHS ABS: 1.5 10*3/uL (ref 0.7–4.0)
LYMPHS PCT: 28.8 % (ref 12.0–46.0)
MCHC: 33.2 g/dL (ref 30.0–36.0)
MCV: 87.3 fl (ref 78.0–100.0)
MONOS PCT: 7.2 % (ref 3.0–12.0)
Monocytes Absolute: 0.4 10*3/uL (ref 0.1–1.0)
NEUTROS ABS: 3.2 10*3/uL (ref 1.4–7.7)
Neutrophils Relative %: 62.3 % (ref 43.0–77.0)
PLATELETS: 229 10*3/uL (ref 150.0–400.0)
RBC: 4.76 Mil/uL (ref 3.87–5.11)
RDW: 14.3 % (ref 11.5–15.5)
WBC: 5.1 10*3/uL (ref 4.0–10.5)

## 2017-12-14 LAB — LDL CHOLESTEROL, DIRECT: Direct LDL: 85 mg/dL

## 2017-12-14 LAB — HEMOGLOBIN A1C: Hgb A1c MFr Bld: 5.9 % (ref 4.6–6.5)

## 2017-12-14 LAB — TSH: TSH: 2.8 u[IU]/mL (ref 0.35–4.50)

## 2017-12-14 NOTE — Progress Notes (Signed)
[name], MD Phone: [phone]  Bailey Brooks is a 67 y.o. female who presents today for follow-up.  Hypertension: Typically in the 591M-384Y systolically.  Taking amlodipine and chlorthalidone.  No chest pain, shortness of breath.  Occasional edema during the summer left ankle slightly greater than right.  It is mild.  No orthopnea or PND.  Hyperlipidemia: Taking Lipitor.  No right upper quadrant pain or myalgias.  Hypothyroidism: Taking Synthroid.  No skin changes.  No heat intolerance.  Possible cold intolerance.  Weight is relatively stable.  Does report intermittent cramps in her legs that have been going on for many years.  Regarding her feet as well.  Typically at night when she stretches out while sitting down.  Occur 2 times per week.  She was previously on Valtrex after stem cell transplant.  She notes that she was advised they told her she could discontinue this after 2 years.  She initially did discontinue it 2 years and then developed shingles.  She went back on it.  Since I last saw her she has discontinued it again as the pharmacist told her that it may affect the effectiveness of the flu shot.  She has not gone back on it.  She possibly had a few bumps in sore areas in her mouth after coming off of this though has not had any recurrent issues.  She wants to stay off of this.  She reports intermittent bruising on her hands has been going on for years.  This is not new.  Typically occurs with an injury.  No other significant bruising.  Social History   Tobacco Use  Smoking Status Never Smoker  Smokeless Tobacco Never Used     ROS see history of present illness  Objective  Physical Exam Vitals:   12/14/17 0853  BP: 116/80  Pulse: 71  Temp: 97.8 F (36.6 C)  SpO2: 96%    BP Readings from Last 3 Encounters:  12/14/17 116/80  09/13/17 (!) 149/84  08/20/17 130/82   Wt Readings from Last 3 Encounters:  12/14/17 244 lb 12.8 oz (111 kg)  09/13/17 249  lb (112.9 kg)  08/20/17 245 lb (111.1 kg)    Physical Exam  Constitutional: No distress.  Cardiovascular: Normal rate, regular rhythm and normal heart sounds.  Pulmonary/Chest: Effort normal and breath sounds normal.  Musculoskeletal: She exhibits no edema.  Neurological: She is alert. Gait normal.  Skin: Skin is warm and dry. She is not diaphoretic.  Small nontender bruise on left dorsum hand near thumb     Assessment/Plan: Please see individual problem list.  Essential hypertension Well-controlled.  Continue current regimen.  Check lab work.  Hypothyroidism Check TSH.  Continue Synthroid.  Hyperlipidemia Most recently well controlled.  Check LDL cholesterol.  Muscle cramps Chronic issue.  Discussed stretching.  She could try yellow mustard as well.  Encouraged hydration.  Bruising Chronic issue with bruising of her hands when she injures them.  She is on aspirin.  We will check a CBC though her platelets have been consistently normal.  Discussed Valtrex with her.  She notes she was advised she could discontinue this after 2 years previously.  She is interested in staying off of this.  Discussed if she goes back on it she should let us know.  Rosary was seen today for follow-up.  Diagnoses and all orders for this visit:  Essential hypertension -     Comp Met (CMET)  Hypothyroidism, unspecified type -     TSH  Elevated glucose -     HgB A1c  Hyperlipidemia, unspecified hyperlipidemia type -     Direct LDL  Bruising -     CBC w/Diff  Muscle cramps    Orders Placed This Encounter  Procedures  . Comp Met (CMET)  . TSH  . HgB A1c  . Direct LDL  . CBC w/Diff    No orders of the defined types were placed in this encounter.    [name], MD [place]

## 2017-12-14 NOTE — Assessment & Plan Note (Signed)
Well-controlled.  Continue current regimen.  Check lab work. 

## 2017-12-14 NOTE — Assessment & Plan Note (Signed)
Chronic issue.  Discussed stretching.  She could try yellow mustard as well.  Encouraged hydration.

## 2017-12-14 NOTE — Assessment & Plan Note (Signed)
Most recently well controlled.  Check LDL cholesterol.

## 2017-12-14 NOTE — Assessment & Plan Note (Signed)
Chronic issue with bruising of her hands when she injures them.  She is on aspirin.  We will check a CBC though her platelets have been consistently normal.

## 2017-12-14 NOTE — Patient Instructions (Signed)
Nice to see you. We will get lab work today and contact you with the results. Please try stretching to help with your cramps.  Please stay well-hydrated.  You could try yellow mustard for this. Please let us know if you decide to go back on Valtrex.

## 2017-12-14 NOTE — Assessment & Plan Note (Signed)
Check TSH.  Continue Synthroid. 

## 2017-12-26 ENCOUNTER — Inpatient Hospital Stay: Payer: BLUE CROSS/BLUE SHIELD

## 2018-01-16 ENCOUNTER — Inpatient Hospital Stay: Payer: BLUE CROSS/BLUE SHIELD | Attending: Obstetrics and Gynecology | Admitting: Obstetrics and Gynecology

## 2018-01-16 VITALS — BP 128/83 | HR 91 | Temp 99.1°F | Resp 18 | Ht 63.0 in | Wt 242.2 lb

## 2018-01-16 DIAGNOSIS — C519 Malignant neoplasm of vulva, unspecified: Secondary | ICD-10-CM | POA: Insufficient documentation

## 2018-01-16 DIAGNOSIS — C4499 Other specified malignant neoplasm of skin, unspecified: Secondary | ICD-10-CM

## 2018-01-16 NOTE — Progress Notes (Signed)
Gynecologic Oncology Interval Visit   Referring Provider: Dr. Kenton Kingfisher.  Chief Concern: Recurrent paget's disease of the vulva  Subjective:  Bailey Brooks is a 67 y.o. female who is seen in consultation from Dr. 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  She has no complaints of new lesions, vulvar itching or irritation.   At her last visit on 05/09/2017 she had a vulvar biopsy that was negative.  DIAGNOSIS:  A. VULVA; BIOPSY:  - SKIN WITH FOCAL KERATOSIS AND HYPERGRANULOSIS.  - NEGATIVE FOR DYSPLASIA AND MALIGNANCY.  Gynecologic Oncology History: Bailey Brooks has a history of localized vulvar Paget's disease.   04/2013    vulvar biopsy revealed Paget's disease               WLE, additional margins resected for positive disease on Frozen evaluation.   Part A: VULVA, VAGINAL MARGIN:  - POSITIVE FOR EXTRAMAMMARY PAGET'S DISEASE.  Marland Kitchen  Part B: VULVA, POSTERIOR MARGIN:  - POSITIVE FOR EXTRAMAMMARY PAGET'S DISEASE.  Marland Kitchen  Part C: VULVA, RIGHT, PARTIAL VULVECTOMY:  - EXTRAMAMMARY PAGET'S DISEASE.  Marland Kitchen  Part D: VULVA, NEW VAGINAL MARGIN:  - POSITIVE FOR EXTRAMAMMARY PAGET'S DISEASE.   07/28/2015 for routine surveillance an area on the right vulva seemed more suspicious for recurrence. This was biopsied and confirmed recurrent Paget's disease.   She underwent repeat excision 09/29/15 A. VULVA; EXCISION:  - RARE CYTOKERATIN 7 POSITIVE INTRAEPIDERMAL CELLS, INTERPRETED AS  RESIDUAL PAGET DISEASE.  - THE SURGICAL MARGINS ARE CLEAR.   B. VULVA, 11:00; BIOPSY:  - RARE CYTOKERATIN 7 POSITIVE INTRAEPIDERMAL CELLS, INTERPRETED AS PAGET  DISEASE.   After her surgery she has not had any further issues.    Of note she has had a h/o recurrent vulvar candidiasis. Prior to her vulvar surgery 09/2015 she received 5 weekly doses of oral diflucan for vulvar candidiasis. Postop she was treated for yeast infection again in 12/17 and then took one Diflucan a week  for six months with good results.    Problem List: Patient Active Problem List   Diagnosis Date Noted  . Bruising 12/14/2017  . History of TIA (transient ischemic attack) 09/13/2017  . Diffuse large B cell lymphoma ([place]) 06/11/2017  . Diffuse large b-cell lymphoma, intra-abdominal lymph nodes ([place]) 06/11/2017  . Vulvar lesion 05/16/2017  . Low back pain 04/03/2017  . History of transient ischemic attack (TIA) 02/10/2017  . Exertional shortness of breath 11/06/2016  . Right hip pain 08/03/2016  . Essential hypertension 07/06/2016  . Left knee pain 06/28/2016  . Hypomagnesemia 03/02/2016  . Morbid obesity due to excess calories ([place]) 03/02/2016  . Adjustment disorder with mixed anxiety and depressed mood 05/25/2015  . 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 Eczema 02/27/2013  . Muscle cramps 11/13/2012  . Personal history of lymphoma 02/20/2012  . Hypothyroidism 02/20/2012  . Hyperlipidemia 02/20/2012    Past Medical History: Past Medical History:  Diagnosis Date  . 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 Allergic rhinitis   . Arthritis   . CHF (congestive heart failure) ([place])   . Diastolic dysfunction    a. 11/2016 Echo: EF 60-65%, no rwma, mild LVH, Gr1 DD, triv MR, mildly dil LA, nl RV fxn.  . Diffuse large B cell lymphoma [facility]) Nov 2011   Dr Inez Pilgrim, Dr. Madelynn Done s/p RCHOP and methotrexate, c/b renal failure  . Diverticulosis   .  Esophagitis   . GERD (gastroesophageal reflux disease)   . H/O stem cell transplant ([place]) 06/2011   a. in setting of lymphoma.  . Hemorrhoids   . History of chemotherapy 22-Oct-2010   RHCOP/methotrexate-intrathecal  . History of stress test    a. 05/2010 Myoview: nl EF, no ischemia/infarct.  . Hypercholesterolemia   . Hypertension   . Hypothyroidism   . Morbid obesity ([place])   . Paget disease, extra mammary    vulva, s/p resection 2014, Dr.  Sabra Heck  . TIA (transient ischemic attack)     Past Surgical History: Past Surgical History:  Procedure Laterality Date  . ABDOMINAL HYSTERECTOMY  1985   Hysterectomy-partial  . BREAST BIOPSY  2002   Neg - AT [facility]  . BURR HOLE W/ PLACEMENT OMMAYA RESERVOIR    . BURR HOLE W/ PLACEMENT OMMAYA RESERVOIR  2012  . COLONOSCOPY  04/2013  . ESOPHAGOGASTRODUODENOSCOPY  04/2013  . INSERTION CENTRAL VENOUS ACCESS DEVICE W/ SUBCUTANEOUS PORT  2011   Port a Cath: Right chest Double Lumen, 04-Nov-2010  . LIMBAL STEM CELL TRANSPLANT  2012  . LIVER BIOPSY     stage 4B large Bcell lymphoma  . ROTATOR CUFF REPAIR Right 2016  . SHOULDER ARTHROSCOPY Right 06/22/2015   Procedure: ARTHROSCOPY SHOULDER, parital repair of rotator cuff, biceps tenodesis, decompression and debridement;  Surgeon: Corky Mull, MD;  Location: [facility] ORS;  Service: Orthopedics;  Laterality: Right;  . TONSILLECTOMY  1954  . VULVECTOMY    . VULVECTOMY PARTIAL N/A 09/29/2015   Procedure: VULVECTOMY PARTIAL;  Surgeon: Gillis Ends, MD;  Location: [facility] ORS;  Service: Gynecology;  Laterality: N/A;    Past Gynecologic History:  S/p hysterectomy  OB History:  OB History  No data available    Family History: Family History  Problem Relation Age of Onset  . Diabetes Mother        died @ 24 of MI.  Marland Kitchen Hypertension Mother   . Heart attack Mother   . Melanoma Father 18       died of complications r/t melanoma w/ lung mets.  . Kidney disease Sister        Kidney removed   . Prostate cancer Brother        Prostate - dx in 91's  . Heart disease Brother 53       reported MI @ age 57, ? treated w/ TPA->no recurrent CAD, now in 19's.  Marland Kitchen Hearing loss Maternal Aunt   . Hearing loss Maternal Grandfather     Social History: Social History   Socioeconomic History  . Marital status: Married    Spouse name: Not on file  . Number of children: 2  . Years of education: Not on file  . Highest education level: Not on file   Social Needs  . Financial resource strain: Not on file  . Food insecurity - worry: Not on file  . Food insecurity - inability: Not on file  . Transportation needs - medical: Not on file  . Transportation needs - non-medical: Not on file  Occupational History  . 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 Smoking status: Never Smoker  . Smokeless tobacco: Never Used  Substance and Sexual Activity  . Alcohol use: No    Alcohol/week: 0.0 oz    Comment: former user-no current use  . Drug use: No  . Sexual activity: Yes    Partners: Male  Birth control/protection: Post-menopausal  Other Topics Concern  . Not on file  Social History Narrative   Lives in [place] with husband, has 2 grown sons.  No pets. 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   Right-handed   Caffeine: occasional caffeine free/diet soda or hot tea    Allergies: Allergies  Allergen Reactions  . Erythromycin Nausea And Vomiting  . Iodine Swelling    IV iodine (states now that this can be tolerated with Benadryl)  . Morphine And Related Nausea And Vomiting and Nausea Only    Hallucinations (also)  . Oxycodone Nausea And Vomiting  . Sulfa Antibiotics Other (See Comments)    Dizzy/Fainting  . Adhesive [Tape] Rash and Other (See Comments)    Including Bandaids    Current Medications: Current Outpatient Medications  Medication Sig Dispense Refill  . amLODipine (NORVASC) 5 MG tablet Take 1 tablet (5 mg total) by mouth daily. 90 tablet 3  . atorvastatin (LIPITOR) 40 MG tablet Take 1 tablet (40 mg total) by mouth daily. 90 tablet 3  . chlorthalidone (HYGROTON) 25 MG tablet Take 0.5 tablets (12.5 mg total) by mouth daily. 90 tablet 3  . lansoprazole (PREVACID) 30 MG capsule take 1 capsule by mouth once daily 30 capsule 2  . levothyroxine (SYNTHROID, LEVOTHROID) 88 MCG tablet take 1 tablet by mouth every morning ON AN EMPTY STOMACH 90 tablet 3  . RA ASPIRIN 325 MG tablet take 1  tablet by mouth once daily 90 tablet 3  . acetaminophen (TYLENOL) 650 MG CR tablet Take 650 mg by mouth 2 (two) times daily.    . mometasone (ELOCON) 0.1 % cream APPLY TO AFFECTED AREA ON CHIN AND ELBOWS TWICE A DAY UNTIL CLEAR, THEN AS NEEDED FOR FLARES  0   No current facility-administered medications for this visit.    General: no complaints  HEENT: no complaints  Lungs: no complaints  Cardiac: no complaints  GI: no complaints  GU: no complaints  Musculoskeletal: no complaints  Extremities: no complaints  Skin: no complaints  Neuro: no complaints  Endocrine: no complaints  Psych: no complaints       Objective:  Physical Examination:  BP 128/83 (BP Location: Left Arm, Patient Position: Sitting)   Pulse 91   Temp 99.1 F (37.3 C) (Tympanic)   Resp 18   Ht 5\' 3"  (1.6 m)   Wt 242 lb 3.2 oz (109.9 kg)   SpO2 97%   BMI 42.90 kg/m    Body mass index is 42.9 kg/m.    ECOG Performance Status: 0 - Asymptomatic  General appearance: alert, cooperative and appears stated age HEENT:PERRLA, extra ocular movement intact and sclera clear, anicteric CV: RRR Lungs: BCTA Back: normal to inspection Neurological exam reveals alert, oriented, normal speech, no focal findings Extremities: negative Abdomen: soft NDNT Lymphatic survery: negative for inguinal adenopathy Pelvic: exam chaperoned by nurse;  Vulva: vulvar scars after resection well healed. No new lesions.  Vagina: normal.  Bimanual: no masses. Cervix/uterus surgically absent.      Assessment:  Bailey Brooks is a 67 y.o. female diagnosed with recurrent Paget's disease of the vulva s/p WLE with positive margins with subsequent recurrence diagnosed 07/2015. Repeat WLE c/w recurrent Paget's disease of the vulva s/p WLE with negative margins and biopsy with rare CK7 + intradermal cells interpreted as Paget's disease, clinically asymptomatic. NED on exam for Paget's .   H/o recurrent vulvar candidiasis, resolved after 6  month course of Diflucan, negative exam today.    Plan:  Problem List Items Addressed This Visit      Musculoskeletal and Integument   Paget disease, extra mammary - Primary (Chronic)     I recommended continued close surveillance for Paget's disease with repeat exam and visit in 6 months. If she develops gross evidence of recurrent Paget's disease, then we can consider [place] therapy.   She will RTC in 6 months.  I personally reviewed the patient's history, completed key elements of her exam, and was involved in decision making in conjunction with Ms. Beckey Rutter.   Gillis Ends, MD

## 2018-01-16 NOTE — Progress Notes (Signed)
No new Gyn changes noted today 

## 2018-02-04 ENCOUNTER — Telehealth: Payer: Self-pay | Admitting: Family Medicine

## 2018-02-04 MED ORDER — LANSOPRAZOLE 30 MG PO CPDR: 30.0000 mg | DELAYED_RELEASE_CAPSULE | Freq: Every day | ORAL | 2 refills | Status: DC

## 2018-02-04 NOTE — Telephone Encounter (Signed)
Copied from [place]. Topic: Quick Communication - Rx Refill/Question >> Feb 04, 2018  7:59 AM Boyd Kerbs wrote:  Medication: lansoprazole (PREVACID) 30 MG capsule   Has the patient contacted their pharmacy? Yes.   They did not have in system they told her  -  took over [employer]   (Agent: If no, request that the patient contact the pharmacy for the refill.)   Preferred Pharmacy (with phone number or street name):  [id number] - [address], [place] AT [place] [address] [place] [address] Phone: [phone] Fax: [phone]     Agent: Please be advised that RX refills may take up to 3 business days. We ask that you follow-up with your pharmacy.

## 2018-03-10 ENCOUNTER — Other Ambulatory Visit: Payer: Self-pay | Admitting: Family Medicine

## 2018-03-13 ENCOUNTER — Telehealth: Payer: Self-pay | Admitting: Family Medicine

## 2018-03-13 MED ORDER — ATORVASTATIN CALCIUM 40 MG PO TABS: 40.0000 mg | ORAL_TABLET | Freq: Every day | ORAL | 3 refills | Status: DC

## 2018-03-13 NOTE — Telephone Encounter (Signed)
Copied from [place] [id number]. Topic: Quick Communication - Rx Refill/Question >> Mar 13, 2018 12:21 PM Scherrie Gerlach wrote: Medication: atorvastatin (LIPITOR) 40 MG tablet [facility] calling to get [facility] rx filled. They say it was at [facility] aid before and they cannot e-scribe these scripts. 90 day [facility] [place], [place] [phone] (Phone) [phone] (Fax)

## 2018-03-13 NOTE — Telephone Encounter (Signed)
Lipitor refill Last OV: 12/14/17 Last Refill:02/23/17 #90 3RF Pharmacy:[facility] [id number] PCP: Dr Caryl Bis

## 2018-04-02 DIAGNOSIS — L821 Other seborrheic keratosis: Secondary | ICD-10-CM | POA: Diagnosis not present

## 2018-04-02 DIAGNOSIS — Z1283 Encounter for screening for malignant neoplasm of skin: Secondary | ICD-10-CM | POA: Diagnosis not present

## 2018-04-02 DIAGNOSIS — D2271 Melanocytic nevi of right lower limb, including hip: Secondary | ICD-10-CM | POA: Diagnosis not present

## 2018-04-02 DIAGNOSIS — D1801 Hemangioma of skin and subcutaneous tissue: Secondary | ICD-10-CM | POA: Diagnosis not present

## 2018-04-11 ENCOUNTER — Telehealth: Payer: Self-pay | Admitting: Family Medicine

## 2018-04-11 NOTE — Telephone Encounter (Signed)
Copied from [place] [id number]. Topic: Quick Communication - Rx Refill/Question >> Apr 11, 2018  5:16 PM Neva Seat wrote: RA ASPIRIN 325 MG tablet  Needing refills  [id number] - [address], [place] AT [place] [address] [place] [address] Phone: [phone] Fax: [phone]

## 2018-04-12 MED ORDER — ASPIRIN 325 MG PO TABS: 325.0000 mg | ORAL_TABLET | Freq: Every day | ORAL | 0 refills | Status: DC

## 2018-04-13 ENCOUNTER — Other Ambulatory Visit: Payer: Self-pay | Admitting: Family Medicine

## 2018-05-23 ENCOUNTER — Other Ambulatory Visit: Payer: Self-pay | Admitting: Family Medicine

## 2018-06-12 ENCOUNTER — Inpatient Hospital Stay: Payer: BLUE CROSS/BLUE SHIELD | Attending: Internal Medicine

## 2018-06-12 ENCOUNTER — Inpatient Hospital Stay (HOSPITAL_BASED_OUTPATIENT_CLINIC_OR_DEPARTMENT_OTHER): Payer: BLUE CROSS/BLUE SHIELD | Admitting: Internal Medicine

## 2018-06-12 VITALS — BP 134/76 | Temp 98.5°F | Resp 16 | Wt 243.0 lb

## 2018-06-12 DIAGNOSIS — I110 Hypertensive heart disease with heart failure: Secondary | ICD-10-CM | POA: Insufficient documentation

## 2018-06-12 DIAGNOSIS — C8333 Diffuse large B-cell lymphoma, intra-abdominal lymph nodes: Secondary | ICD-10-CM | POA: Diagnosis not present

## 2018-06-12 DIAGNOSIS — E039 Hypothyroidism, unspecified: Secondary | ICD-10-CM | POA: Diagnosis not present

## 2018-06-12 DIAGNOSIS — Z79899 Other long term (current) drug therapy: Secondary | ICD-10-CM

## 2018-06-12 DIAGNOSIS — Z7982 Long term (current) use of aspirin: Secondary | ICD-10-CM | POA: Diagnosis not present

## 2018-06-12 DIAGNOSIS — I5030 Unspecified diastolic (congestive) heart failure: Secondary | ICD-10-CM | POA: Diagnosis not present

## 2018-06-12 DIAGNOSIS — J842 Lymphoid interstitial pneumonia: Secondary | ICD-10-CM

## 2018-06-12 DIAGNOSIS — Z8673 Personal history of transient ischemic attack (TIA), and cerebral infarction without residual deficits: Secondary | ICD-10-CM

## 2018-06-12 DIAGNOSIS — N909 Noninflammatory disorder of vulva and perineum, unspecified: Secondary | ICD-10-CM

## 2018-06-12 DIAGNOSIS — E7800 Pure hypercholesterolemia, unspecified: Secondary | ICD-10-CM | POA: Diagnosis not present

## 2018-06-12 DIAGNOSIS — K219 Gastro-esophageal reflux disease without esophagitis: Secondary | ICD-10-CM | POA: Insufficient documentation

## 2018-06-12 DIAGNOSIS — J8482 Adult pulmonary Langerhans cell histiocytosis: Secondary | ICD-10-CM | POA: Diagnosis not present

## 2018-06-12 LAB — COMPREHENSIVE METABOLIC PANEL
ALK PHOS: 92 U/L (ref 38–126)
ALT: 21 U/L (ref 0–44)
ANION GAP: 11 (ref 5–15)
AST: 21 U/L (ref 15–41)
Albumin: 3.8 g/dL (ref 3.5–5.0)
BUN: 21 mg/dL (ref 8–23)
CALCIUM: 9.4 mg/dL (ref 8.9–10.3)
CO2: 27 mmol/L (ref 22–32)
CREATININE: 0.85 mg/dL (ref 0.44–1.00)
Chloride: 101 mmol/L (ref 98–111)
GFR calc Af Amer: >60 mL/min (ref >60)
Glucose, Bld: 97 mg/dL (ref 70–99)
Potassium: 3.4 mmol/L — ABNORMAL LOW (ref 3.5–5.1)
SODIUM: 139 mmol/L (ref 135–145)
Total Bilirubin: 0.7 mg/dL (ref 0.3–1.2)
Total Protein: 7.2 g/dL (ref 6.5–8.1)

## 2018-06-12 LAB — LACTATE DEHYDROGENASE: LDH: 123 U/L (ref 98–192)

## 2018-06-12 LAB — CBC WITH DIFFERENTIAL/PLATELET
Basophils Absolute: 0.1 10*3/uL (ref 0–0.1)
Basophils Relative: 1 %
EOS ABS: 0.1 10*3/uL (ref 0–0.7)
EOS PCT: 2 %
HCT: 39.8 % (ref 35.0–47.0)
HEMOGLOBIN: 13.6 g/dL (ref 12.0–16.0)
LYMPHS PCT: 35 %
Lymphs Abs: 2.1 10*3/uL (ref 1.0–3.6)
MCH: 29.1 pg (ref 26.0–34.0)
MCHC: 34.3 g/dL (ref 32.0–36.0)
MCV: 84.9 fL (ref 80.0–100.0)
MONOS PCT: 7 %
Monocytes Absolute: 0.4 10*3/uL (ref 0.2–0.9)
Neutro Abs: 3.2 10*3/uL (ref 1.4–6.5)
Neutrophils Relative %: 55 %
PLATELETS: 233 10*3/uL (ref 150–440)
RBC: 4.69 MIL/uL (ref 3.80–5.20)
RDW: 15.9 % — ABNORMAL HIGH (ref 11.5–14.5)
WBC: 5.9 10*3/uL (ref 3.6–11.0)

## 2018-06-12 LAB — MAGNESIUM: Magnesium: 1.9 mg/dL (ref 1.7–2.4)

## 2018-06-12 NOTE — Assessment & Plan Note (Addendum)
#   DLBCL- Stage IV status post autologous symptoms of transplant in 2012. Clinically no evidence of recurrence. CBC/CMP/LDH- normal.   # paget's disease of Vulva- status post recent Bx [june 2018]- neg for malignancy. Continued follow up with gynecology oncology.  #Discussed regular skin malignancy screening [patient follows up with dermatology]; and screening colonoscopies mammograms as recommended.  I reminded patient's status post autologous stem cell transplant at a slightly high risk of second malignancies.  #Since patient is essentially cured of her diffuse large B-cell lymphoma; I would not recommend any further imaging at this time. No special tests required..  Discussed surveillance with [place] versus PCP patient feels comfortable following up with PCP.   Cc; Dr.Sonnenberg.

## 2018-06-12 NOTE — Progress Notes (Signed)
[facility] OFFICE PROGRESS NOTE  Patient Care Team: Leone Haven, MD as PCP - General (Family Medicine) Clent Jacks, RN as Registered Nurse  Cancer Staging No matching staging information was found for the patient.   Oncology History   # 2011- DLBCL with mesenteric mass & liver involvement s/p chemo- s/p Autotransplant [JUNE 2012; [facility]] s/p IT  # 2014-Localized Pagets disease of Vulva s/p resection [Dr.Secord]; last re-exciosn Oct 2016  # Shingles prophylaxis secondary- Valtrex 500 mg twice a day     Diffuse large b-cell lymphoma, intra-abdominal lymph nodes ([place])      INTERVAL HISTORY:  Bailey Brooks 67 y.o.  female pleasant patient above history of diffuse large B-cell lymphoma is here for follow-up.  Patient has no new complaints.   Review of Systems  Constitutional: Negative for chills, diaphoresis, fever, malaise/fatigue and weight loss.  HENT: Negative for nosebleeds and sore throat.   Eyes: Negative for double vision.  Respiratory: Negative for cough, hemoptysis, sputum production, shortness of breath and wheezing.   Cardiovascular: Negative for chest pain, palpitations, orthopnea and leg swelling.  Gastrointestinal: Negative for abdominal pain, blood in stool, constipation, diarrhea, heartburn, melena, nausea and vomiting.  Genitourinary: Negative for dysuria, frequency and urgency.  Musculoskeletal: Negative for back pain and joint pain.  Skin: Negative.  Negative for itching and rash.  Neurological: Negative for dizziness, tingling, focal weakness, weakness and headaches.  Endo/Heme/Allergies: Does not bruise/bleed easily.  Psychiatric/Behavioral: Negative for depression. The patient is not nervous/anxious and does not have insomnia.       PAST MEDICAL HISTORY :  Past Medical History:  Diagnosis Date  . 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 Allergic rhinitis   .  Arthritis   . CHF (congestive heart failure) ([place])   . Diastolic dysfunction    a. 11/2016 Echo: EF 60-65%, no rwma, mild LVH, Gr1 DD, triv MR, mildly dil LA, nl RV fxn.  . Diffuse large B cell lymphoma [facility]) Nov 2011   Dr Inez Pilgrim, Dr. Madelynn Done s/p RCHOP and methotrexate, c/b renal failure  . Diverticulosis   . Esophagitis   . GERD (gastroesophageal reflux disease)   . H/O stem cell transplant ([place]) 06/2011   a. in setting of lymphoma.  . Hemorrhoids   . History of chemotherapy 22-Oct-2010   RHCOP/methotrexate-intrathecal  . History of stress test    a. 05/2010 Myoview: nl EF, no ischemia/infarct.  . Hypercholesterolemia   . Hypertension   . Hypothyroidism   . Morbid obesity ([place])   . Paget disease, extra mammary    vulva, s/p resection 2014, Dr. Sabra Heck  . TIA (transient ischemic attack)     PAST SURGICAL HISTORY :   Past Surgical History:  Procedure Laterality Date  . ABDOMINAL HYSTERECTOMY  1985   Hysterectomy-partial  . BREAST BIOPSY  2002   Neg - AT [facility]  . BURR HOLE W/ PLACEMENT OMMAYA RESERVOIR    . BURR HOLE W/ PLACEMENT OMMAYA RESERVOIR  2012  . COLONOSCOPY  04/2013  . ESOPHAGOGASTRODUODENOSCOPY  04/2013  . INSERTION CENTRAL VENOUS ACCESS DEVICE W/ SUBCUTANEOUS PORT  2011   Port a Cath: Right chest Double Lumen, 04-Nov-2010  . LIMBAL STEM CELL TRANSPLANT  2012  . LIVER BIOPSY     stage 4B large Bcell lymphoma  . ROTATOR CUFF REPAIR Right 2016  . SHOULDER ARTHROSCOPY Right 06/22/2015   Procedure: ARTHROSCOPY SHOULDER, parital repair of rotator cuff, biceps tenodesis,  decompression and debridement;  Surgeon: Corky Mull, MD;  Location: [facility] ORS;  Service: Orthopedics;  Laterality: Right;  . TONSILLECTOMY  1954  . VULVECTOMY    . VULVECTOMY PARTIAL N/A 09/29/2015   Procedure: VULVECTOMY PARTIAL;  Surgeon: Gillis Ends, MD;  Location: [facility] ORS;  Service: Gynecology;  Laterality: N/A;    FAMILY HISTORY :   Family History  Problem Relation Age of Onset   . Diabetes Mother        died @ 74 of MI.  Marland Kitchen Hypertension Mother   . Heart attack Mother   . Melanoma Father 31       died of complications r/t melanoma w/ lung mets.  . Kidney disease Sister        Kidney removed   . Prostate cancer Brother        Prostate - dx in 26's  . Heart disease Brother 59       reported MI @ age 33, ? treated w/ TPA->no recurrent CAD, now in 59's.  Marland Kitchen Hearing loss Maternal Aunt   . Hearing loss Maternal Grandfather     SOCIAL HISTORY:   Social History   Tobacco Use  . Smoking status: Never Smoker  . Smokeless tobacco: Never Used  Substance Use Topics  . Alcohol use: No    Alcohol/week: 0.0 oz    Comment: former user-no current use  . Drug use: No    ALLERGIES:  is allergic to erythromycin; iodine; morphine and related; oxycodone; sulfa antibiotics; and adhesive [tape].  MEDICATIONS:  Current Outpatient Medications  Medication Sig Dispense Refill  . acetaminophen (TYLENOL) 650 MG CR tablet Take 650 mg by mouth 2 (two) times daily.    Marland Kitchen amLODipine (NORVASC) 5 MG tablet Take 1 tablet (5 mg total) by mouth daily. 90 tablet 3  . aspirin (RA ASPIRIN) 325 MG tablet Take 1 tablet (325 mg total) by mouth daily. 90 tablet 0  . atorvastatin (LIPITOR) 40 MG tablet Take 1 tablet (40 mg total) by mouth daily. 90 tablet 3  . chlorthalidone (HYGROTON) 25 MG tablet Take 0.5 tablets (12.5 mg total) by mouth daily. 90 tablet 3  . lansoprazole (PREVACID) 30 MG capsule TAKE 1 CAPSULE BY MOUTH ONCE DAILY 30 capsule 0  . levothyroxine (SYNTHROID, LEVOTHROID) 88 MCG tablet take 1 tablet by mouth every morning ON AN EMPTY STOMACH 90 tablet 3  . mometasone (ELOCON) 0.1 % cream APPLY TO AFFECTED AREA ON CHIN AND ELBOWS TWICE A DAY UNTIL CLEAR, THEN AS NEEDED FOR FLARES  0   No current facility-administered medications for this visit.     PHYSICAL EXAMINATION: ECOG PERFORMANCE STATUS: 0 - Asymptomatic  BP 134/76 (BP Location: Left Arm, Patient Position: Sitting)    Temp 98.5 F (36.9 C) (Tympanic)   Resp 16   Wt 243 lb (110.2 kg)   BMI 43.05 kg/m   Filed Weights   06/12/18 1152  Weight: 243 lb (110.2 kg)    GENERAL: Well-nourished well-developed; Alert, no distress and comfortable.   Alone.  EYES: no pallor or icterus OROPHARYNX: no thrush or ulceration; NECK: supple; no lymph nodes felt. LYMPH:  no palpable lymphadenopathy in the axillary or inguinal regions LUNGS: Decreased breath sounds auscultation bilaterally. No wheeze or crackles HEART/CVS: regular rate & rhythm and no murmurs; No lower extremity edema ABDOMEN:abdomen soft, non-tender and normal bowel sounds. No hepatomegaly or splenomegaly.  Musculoskeletal:no cyanosis of digits and no clubbing  PSYCH: alert & oriented x 3 with fluent speech NEURO:  no focal motor/sensory deficits SKIN:  no rashes or significant lesions    LABORATORY DATA:  I have reviewed the data as listed    Component Value Date/Time   NA 139 06/12/2018 1123   NA 138 09/03/2013 0809   K 3.4 (L) 06/12/2018 1123   K 4.3 11/02/2014 0932   CL 101 06/12/2018 1123   CL 105 09/03/2013 0809   CO2 27 06/12/2018 1123   CO2 28 09/03/2013 0809   GLUCOSE 97 06/12/2018 1123   GLUCOSE 100 (H) 09/03/2013 0809   BUN 21 06/12/2018 1123   BUN 17 09/03/2013 0809   CREATININE 0.85 06/12/2018 1123   CREATININE 0.96 11/02/2014 0929   CALCIUM 9.4 06/12/2018 1123   CALCIUM 9.1 09/03/2013 0809   PROT 7.2 06/12/2018 1123   PROT 6.6 11/02/2014 0929   ALBUMIN 3.8 06/12/2018 1123   ALBUMIN 3.3 (L) 11/02/2014 0929   AST 21 06/12/2018 1123   AST 16 11/02/2014 0929   ALT 21 06/12/2018 1123   ALT 19 11/02/2014 0929   ALKPHOS 92 06/12/2018 1123   ALKPHOS 87 11/02/2014 0929   BILITOT 0.7 06/12/2018 1123   BILITOT 0.3 11/02/2014 0929   GFRNONAA >60 06/12/2018 1123   GFRNONAA >60 11/02/2014 0929   GFRNONAA >60 07/27/2014 0836   GFRAA >60 06/12/2018 1123   GFRAA >60 11/02/2014 0929   GFRAA >60 07/27/2014 0836    No results  found for: SPEP, UPEP  Lab Results  Component Value Date   WBC 5.9 06/12/2018   NEUTROABS 3.2 06/12/2018   HGB 13.6 06/12/2018   HCT 39.8 06/12/2018   MCV 84.9 06/12/2018   PLT 233 06/12/2018      Chemistry      Component Value Date/Time   NA 139 06/12/2018 1123   NA 138 09/03/2013 0809   K 3.4 (L) 06/12/2018 1123   K 4.3 11/02/2014 0932   CL 101 06/12/2018 1123   CL 105 09/03/2013 0809   CO2 27 06/12/2018 1123   CO2 28 09/03/2013 0809   BUN 21 06/12/2018 1123   BUN 17 09/03/2013 0809   CREATININE 0.85 06/12/2018 1123   CREATININE 0.96 11/02/2014 0929      Component Value Date/Time   CALCIUM 9.4 06/12/2018 1123   CALCIUM 9.1 09/03/2013 0809   ALKPHOS 92 06/12/2018 1123   ALKPHOS 87 11/02/2014 0929   AST 21 06/12/2018 1123   AST 16 11/02/2014 0929   ALT 21 06/12/2018 1123   ALT 19 11/02/2014 0929   BILITOT 0.7 06/12/2018 1123   BILITOT 0.3 11/02/2014 0929       RADIOGRAPHIC STUDIES: I have personally reviewed the radiological images as listed and agreed with the findings in the report. No results found.   ASSESSMENT & PLAN:  Diffuse large b-cell lymphoma, intra-abdominal lymph nodes ([place]) # DLBCL- Stage IV status post autologous symptoms of transplant in 2012. Clinically no evidence of recurrence. CBC/CMP/LDH- normal.   # paget's disease of Vulva- status post recent Bx [june 2018]- neg for malignancy. Continued follow up with gynecology oncology.  #Discussed regular skin malignancy screening [patient follows up with dermatology]; and screening colonoscopies mammograms as recommended.  I reminded patient's status post autologous stem cell transplant at a slightly high risk of second malignancies.  #Since patient is essentially cured of her diffuse large B-cell lymphoma; I would not recommend any further imaging at this time. No special tests required..  Discussed surveillance with [place] versus PCP patient feels comfortable following up with PCP.   Cc;  Dr.Sonnenberg.    No orders of the defined types were placed in this encounter.  All questions were answered. The patient knows to call the clinic with any problems, questions or concerns.      Cammie Sickle, MD 06/14/2018 9:55 AM

## 2018-06-13 ENCOUNTER — Ambulatory Visit: Payer: BLUE CROSS/BLUE SHIELD | Admitting: Family Medicine

## 2018-06-18 ENCOUNTER — Encounter: Payer: Self-pay | Admitting: Family Medicine

## 2018-06-18 ENCOUNTER — Ambulatory Visit: Payer: BLUE CROSS/BLUE SHIELD | Admitting: Family Medicine

## 2018-06-18 ENCOUNTER — Ambulatory Visit (INDEPENDENT_AMBULATORY_CARE_PROVIDER_SITE_OTHER): Payer: BLUE CROSS/BLUE SHIELD

## 2018-06-18 VITALS — BP 128/90 | HR 75 | Temp 98.4°F | Ht 63.0 in | Wt 244.2 lb

## 2018-06-18 DIAGNOSIS — G4710 Hypersomnia, unspecified: Secondary | ICD-10-CM

## 2018-06-18 DIAGNOSIS — H3530 Unspecified macular degeneration: Secondary | ICD-10-CM

## 2018-06-18 DIAGNOSIS — C8333 Diffuse large B-cell lymphoma, intra-abdominal lymph nodes: Secondary | ICD-10-CM | POA: Diagnosis not present

## 2018-06-18 DIAGNOSIS — M1711 Unilateral primary osteoarthritis, right knee: Secondary | ICD-10-CM | POA: Diagnosis not present

## 2018-06-18 DIAGNOSIS — E039 Hypothyroidism, unspecified: Secondary | ICD-10-CM | POA: Diagnosis not present

## 2018-06-18 DIAGNOSIS — K219 Gastro-esophageal reflux disease without esophagitis: Secondary | ICD-10-CM

## 2018-06-18 DIAGNOSIS — M25561 Pain in right knee: Secondary | ICD-10-CM

## 2018-06-18 DIAGNOSIS — I10 Essential (primary) hypertension: Secondary | ICD-10-CM | POA: Diagnosis not present

## 2018-06-18 DIAGNOSIS — G8929 Other chronic pain: Secondary | ICD-10-CM | POA: Diagnosis not present

## 2018-06-18 LAB — TSH: TSH: 3.85 u[IU]/mL (ref 0.35–4.50)

## 2018-06-18 NOTE — Patient Instructions (Signed)
Nice to see you. We will get the x-ray of your knee and check her TSH.  We will contact you with the results. If you would like we could get a sleep study to evaluate your sleepiness.

## 2018-06-18 NOTE — Progress Notes (Signed)
[name], MD Phone: [phone]  Bailey Brooks is a 67 y.o. female who presents today for f/u.  CC: Hypertension, lymphoma, macular degeneration, medial right knee pain, GERD  HYPERTENSION  Disease Monitoring  Home BP Monitoring not checking Chest pain- no    Dyspnea- no Medications  Compliance-  Taking amlodipinde, chlorthalidone.  Edema- mild ankle and foot edema, chronic, intermittent, goes away over night, no orthopnea or PND  GERD:   Reflux symptoms: no     Blood in stool: no  Dysphagia: no   EGD: had prior egd  Medication: prevacid  Medial right knee pain: Patient notes this is been going on for some time.  When she sits and then gets up this area will hurt.  Walking hurts as well.  No injury.  Ibuprofen somewhat beneficial.  She does not take very much of this (200 mg at a time).  Tylenol helps as well.  She notes it appears a little puffy on the medial aspect and slightly inferior.  Lymphoma: She reports oncology released her from clinic.  They advised that she just needed her lymph nodes checked in her neck and her armpits.  Ophthalmology advised her that she has the likely beginnings of macular degeneration.  She will continue to follow-up with them.  She reports that she has been quite tired recently.  This is been going on for some time.  She can fall asleep very easily.  Epworth Sleepiness Scale score of 9.   Social History   Tobacco Use  Smoking Status Never Smoker  Smokeless Tobacco Never Used     ROS see history of present illness  Objective  Physical Exam Vitals:   06/18/18 0807  BP: 128/90  Pulse: 75  Temp: 98.4 F (36.9 C)  SpO2: 96%    BP Readings from Last 3 Encounters:  06/18/18 128/90  06/12/18 134/76  01/16/18 128/83   Wt Readings from Last 3 Encounters:  06/18/18 244 lb 3.2 oz (110.8 kg)  06/12/18 243 lb (110.2 kg)  01/16/18 242 lb 3.2 oz (109.9 kg)    Physical Exam  Constitutional: No distress.  Cardiovascular:  Normal rate, regular rhythm and normal heart sounds.  Pulmonary/Chest: Effort normal and breath sounds normal.  Abdominal: Soft. Bowel sounds are normal. She exhibits no distension. There is no tenderness.  Musculoskeletal: She exhibits no edema.  The medial aspect and medial posterior aspect with tenderness in line with the joint line, no palpable abnormalities noted, there is slight puffiness to the medial aspect of the knee and just inferior to this, the inferior area is not tender  Neurological: She is alert.  Skin: Skin is warm and dry. She is not diaphoretic.     Assessment/Plan: Please see individual problem list.  Essential hypertension At goal.  Continue current regimen.  Hypothyroidism Check TSH.  Could be contributing to her tiredness.  Diffuse large b-cell lymphoma, intra-abdominal lymph nodes ([place]) Released by oncology.  Plan for yearly exams.  Macular degeneration Likely has early macular degeneration.  She will continue to follow with ophthalmology.  GERD (gastroesophageal reflux disease) No symptoms.  Continue Prevacid.  Hypersomnia Epworth Sleepiness Scale score of 9.  I did not discuss the possibility of a sleep study with her and I will have the [facility] contact her to do this.  Chronic pain of right knee Obtain x-ray.  May need to see orthopedics.   Orders Placed This Encounter  Procedures  . DG Knee Complete 4 Views Right    Standing  Status:   Future    Number of Occurrences:   1    Standing Expiration Date:   08/20/2019    Order Specific Question:   Reason for Exam (SYMPTOM  OR DIAGNOSIS REQUIRED)    Answer:   medial right knee pain, chronic, no injury    Order Specific Question:   Preferred imaging location?    Answer:   [facility] Specific Question:   Radiology Contrast Protocol - do NOT remove file path    Answer:   \\[email or web address]s.pdf  . TSH    No orders of the defined types were  placed in this encounter.    [name], MD [place]

## 2018-06-19 ENCOUNTER — Other Ambulatory Visit: Payer: Self-pay | Admitting: Family Medicine

## 2018-06-20 DIAGNOSIS — M25561 Pain in right knee: Secondary | ICD-10-CM

## 2018-06-20 DIAGNOSIS — G8929 Other chronic pain: Secondary | ICD-10-CM | POA: Insufficient documentation

## 2018-06-20 DIAGNOSIS — G4710 Hypersomnia, unspecified: Secondary | ICD-10-CM | POA: Insufficient documentation

## 2018-06-20 DIAGNOSIS — K219 Gastro-esophageal reflux disease without esophagitis: Secondary | ICD-10-CM | POA: Insufficient documentation

## 2018-06-20 DIAGNOSIS — H3530 Unspecified macular degeneration: Secondary | ICD-10-CM | POA: Insufficient documentation

## 2018-06-20 HISTORY — DX: Hypersomnia, unspecified: G47.10

## 2018-06-20 NOTE — Assessment & Plan Note (Signed)
Epworth Sleepiness Scale score of 9.  I did not discuss the possibility of a sleep study with her and I will have the [facility] contact her to do this.

## 2018-06-20 NOTE — Assessment & Plan Note (Signed)
Released by oncology.  Plan for yearly exams.

## 2018-06-20 NOTE — Assessment & Plan Note (Signed)
At goal. Continue current regimen. 

## 2018-06-20 NOTE — Assessment & Plan Note (Signed)
Obtain x-ray.  May need to see orthopedics.

## 2018-06-20 NOTE — Assessment & Plan Note (Signed)
No symptoms.  Continue Prevacid.

## 2018-06-20 NOTE — Assessment & Plan Note (Signed)
Check TSH.  Could be contributing to her tiredness.

## 2018-06-20 NOTE — Assessment & Plan Note (Signed)
Likely has early macular degeneration.  She will continue to follow with ophthalmology.

## 2018-06-21 ENCOUNTER — Encounter: Payer: Self-pay | Admitting: Family Medicine

## 2018-06-24 DIAGNOSIS — M1711 Unilateral primary osteoarthritis, right knee: Secondary | ICD-10-CM | POA: Diagnosis not present

## 2018-06-24 NOTE — Progress Notes (Signed)
Patient does not want to do a sleep study at this time

## 2018-07-01 ENCOUNTER — Other Ambulatory Visit: Payer: Self-pay | Admitting: Family Medicine

## 2018-07-02 HISTORY — PX: INJECTION KNEE: SHX2446

## 2018-07-04 ENCOUNTER — Ambulatory Visit (INDEPENDENT_AMBULATORY_CARE_PROVIDER_SITE_OTHER): Payer: BLUE CROSS/BLUE SHIELD

## 2018-07-04 VITALS — BP 124/78 | HR 66

## 2018-07-04 DIAGNOSIS — I10 Essential (primary) hypertension: Secondary | ICD-10-CM | POA: Diagnosis not present

## 2018-07-04 NOTE — Progress Notes (Signed)
Patient comes in today for a blood pressure check. Patient was seen by PCP on 06/18/18 and her blood pressure was 128/90 and pulse was 75. Today patients blood pressure in left arm is 124/78 and pulse is 66.Patient denies any symptoms and states she is feeling great.

## 2018-07-04 NOTE — Progress Notes (Signed)
BP is well controlled. No changes to be made.

## 2018-07-17 ENCOUNTER — Inpatient Hospital Stay: Payer: BLUE CROSS/BLUE SHIELD | Attending: Obstetrics and Gynecology | Admitting: Obstetrics and Gynecology

## 2018-07-17 VITALS — BP 124/77 | HR 85 | Temp 97.6°F | Resp 18 | Ht 63.0 in | Wt 244.9 lb

## 2018-07-17 DIAGNOSIS — C519 Malignant neoplasm of vulva, unspecified: Secondary | ICD-10-CM

## 2018-07-17 DIAGNOSIS — Z90722 Acquired absence of ovaries, bilateral: Secondary | ICD-10-CM

## 2018-07-17 DIAGNOSIS — C4499 Other specified malignant neoplasm of skin, unspecified: Secondary | ICD-10-CM

## 2018-07-17 DIAGNOSIS — Z90710 Acquired absence of both cervix and uterus: Secondary | ICD-10-CM

## 2018-07-17 NOTE — Progress Notes (Signed)
Gynecologic Oncology Interval Visit   Referring Provider: Dr. Kenton Kingfisher  Chief Concern: Recurrent paget's disease of the vulva  Subjective:  Bailey Brooks is a 67 y.o. female, diagnosed with Paget's of the vulva, s/p WLE and vulvar biopsy 09/29/2015, returns to clinic today for surveillance.   She was last seen in gyn-onc clinic on 01/16/18 by Dr. Theora Gianotti. She had no evidence of disease at that time. She has prior hx of recurrent vulvar candidiasis which resolved 6 month course of Diflucan.   Today, she denies any new lesions or vulvar itching or irritation. She has swelling of her lower extremities which was evaluated by her PCP and thought to be related to amlodipine. She started lasix which has improved her symptoms. She was seen by Dr. 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   Gynecologic Oncology History: Bailey Brooks has a history of localized vulvar Paget's disease.   04/2013- vulvar biopsy revealed Paget's disease               WLE, additional margins resected for positive disease on frozen evaluation.   Part A: VULVA, VAGINAL MARGIN:  - POSITIVE FOR EXTRAMAMMARY PAGET'S DISEASE  Part B: VULVA, POSTERIOR MARGIN:  - POSITIVE FOR EXTRAMAMMARY PAGET'S DISEASE  Part C: VULVA, RIGHT, PARTIAL VULVECTOMY:  - EXTRAMAMMARY PAGET'S DISEASE  Part D: VULVA, NEW VAGINAL MARGIN:  - POSITIVE FOR EXTRAMAMMARY PAGET'S DISEASE  07/28/2015 for routine surveillance an area on the right vulva seemed more suspicious for recurrence. This was biopsied and confirmed recurrent Paget's disease.   She underwent repeat excision 09/29/15 A. VULVA; EXCISION:  - RARE CYTOKERATIN 7 POSITIVE INTRAEPIDERMAL CELLS, INTERPRETED AS RESIDUAL PAGET DISEASE.  - THE SURGICAL MARGINS ARE CLEAR.   B. VULVA, 11:00; BIOPSY:  - RARE CYTOKERATIN 7 POSITIVE INTRAEPIDERMAL CELLS, INTERPRETED AS PAGET DISEASE.   No additional issues since surgery.   She had  vulvar biopsy on 05/09/17 which was negative:   DIAGNOSIS:  A. VULVA; BIOPSY:  - SKIN WITH FOCAL KERATOSIS AND HYPERGRANULOSIS - NEGATIVE FOR DYSPLASIA AND MALIGNANCY   Of note, she has had a h/o recurrent vulvar candidiasis. Prior to her vulvar surgery 09/2015 she received 5 weekly doses of oral diflucan for vulvar candidiasis. Postop she was treated for yeast infection again in 12/17 and then took one Diflucan a week for six months with good results.    Problem List: Patient Active Problem List   Diagnosis Date Noted  . Macular degeneration 06/20/2018  . GERD (gastroesophageal reflux disease) 06/20/2018  . Hypersomnia 06/20/2018  . Chronic pain of right knee 06/20/2018  . Bruising 12/14/2017  . History of TIA (transient ischemic attack) 09/13/2017  . Diffuse large B cell lymphoma ([place]) 06/11/2017  . Diffuse large b-cell lymphoma, intra-abdominal lymph nodes ([place]) 06/11/2017  . Vulvar lesion 05/16/2017  . Low back pain 04/03/2017  . History of transient ischemic attack (TIA) 02/10/2017  . Exertional shortness of breath 11/06/2016  . Right hip pain 08/03/2016  . Essential hypertension 07/06/2016  . Left knee pain 06/28/2016  . Hypomagnesemia 03/02/2016  . Morbid obesity due to excess calories ([place]) 03/02/2016  . Adjustment disorder with mixed anxiety and depressed mood 05/25/2015  . 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 Eczema 02/27/2013  . Muscle cramps 11/13/2012  . Personal history of lymphoma 02/20/2012  . Hypothyroidism 02/20/2012  . Hyperlipidemia 02/20/2012    Past Medical History: Past Medical History:  Diagnosis  Date  . 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 Allergic rhinitis   . Arthritis   . CHF (congestive heart failure) ([place])   . Diastolic dysfunction    a. 11/2016 Echo: EF 60-65%, no rwma, mild LVH, Gr1 DD, triv MR, mildly dil LA, nl RV fxn.  . Diffuse large B  cell lymphoma [facility]) Nov 2011   Dr Inez Pilgrim, Dr. Madelynn Done s/p RCHOP and methotrexate, c/b renal failure  . Diverticulosis   . Esophagitis   . GERD (gastroesophageal reflux disease)   . H/O stem cell transplant ([place]) 06/2011   a. in setting of lymphoma.  . Hemorrhoids   . History of chemotherapy 22-Oct-2010   RHCOP/methotrexate-intrathecal  . History of stress test    a. 05/2010 Myoview: nl EF, no ischemia/infarct.  . Hypercholesterolemia   . Hypertension   . Hypothyroidism   . Morbid obesity ([place])   . Paget disease, extra mammary    vulva, s/p resection 2014, Dr. Sabra Heck  . Pagets disease, extra-mammary   . TIA (transient ischemic attack)     Past Surgical History: Past Surgical History:  Procedure Laterality Date  . ABDOMINAL HYSTERECTOMY  1985   Hysterectomy-partial  . BREAST BIOPSY  2002   Neg - AT [facility]  . BURR HOLE W/ PLACEMENT OMMAYA RESERVOIR    . BURR HOLE W/ PLACEMENT OMMAYA RESERVOIR  2012  . COLONOSCOPY  04/2013  . ESOPHAGOGASTRODUODENOSCOPY  04/2013  . INJECTION KNEE  07/02/2018  . INSERTION CENTRAL VENOUS ACCESS DEVICE W/ SUBCUTANEOUS PORT  2011   Port a Cath: Right chest Double Lumen, 04-Nov-2010  . LIMBAL STEM CELL TRANSPLANT  2012  . LIVER BIOPSY     stage 4B large Bcell lymphoma  . ROTATOR CUFF REPAIR Right 2016  . SHOULDER ARTHROSCOPY Right 06/22/2015   Procedure: ARTHROSCOPY SHOULDER, parital repair of rotator cuff, biceps tenodesis, decompression and debridement;  Surgeon: Corky Mull, MD;  Location: [facility] ORS;  Service: Orthopedics;  Laterality: Right;  . TONSILLECTOMY  1954  . VULVECTOMY    . VULVECTOMY PARTIAL N/A 09/29/2015   Procedure: VULVECTOMY PARTIAL;  Surgeon: Gillis Ends, MD;  Location: [facility] ORS;  Service: Gynecology;  Laterality: N/A;    Past Gynecologic History:  S/p hysterectomy  OB History:  OB History  No data available    Family History: Family History  Problem Relation Age of Onset  . Diabetes Mother        died @  56 of MI.  Marland Kitchen Hypertension Mother   . Heart attack Mother   . Melanoma Father 83       died of complications r/t melanoma w/ lung mets.  . Kidney disease Sister        Kidney removed   . Prostate cancer Brother        Prostate - dx in 71's  . Heart disease Brother 66       reported MI @ age 61, ? treated w/ TPA->no recurrent CAD, now in 83's.  Marland Kitchen Hearing loss Maternal Aunt   . Hearing loss Maternal Grandfather     Social History: Social History   Socioeconomic History  . Marital status: Married    Spouse name: Not on file  . Number of children: 2  . Years of education: Not on file  . Highest education level: Not on file  Occupational History  . 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 Financial resource strain: Not on file  . Food insecurity:    Worry: Not on file    Inability: Not on file  . Transportation needs:    Medical: Not on file    Non-medical: Not on file  Tobacco Use  . Smoking status: Never Smoker  . Smokeless tobacco: Never Used  Substance and Sexual Activity  . Alcohol use: No    Alcohol/week: 0.0 standard drinks    Comment: former user-no current use  . Drug use: No  . Sexual activity: Yes    Partners: Male    Birth control/protection: Post-menopausal  Lifestyle  . Physical activity:    Days per week: Not on file    Minutes per session: Not on file  . Stress: Not on file  Relationships  . Social connections:    Talks on phone: Not on file    Gets together: Not on file    Attends religious service: Not on file    Active member of club or organization: Not on file    Attends meetings of clubs or organizations: Not on file    Relationship status: Not on file  . Intimate partner violence:    Fear of current or ex partner: Not on file    Emotionally abused: Not on file    Physically abused: Not on file    Forced sexual activity: Not on file  Other Topics Concern  . Not on file  Social History  Narrative   Lives in [place] with husband, has 2 grown sons.  No pets. 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.   Right-handed   Caffeine: occasional caffeine free/diet soda or hot tea    Allergies: Allergies  Allergen Reactions  . Erythromycin Nausea And Vomiting  . Iodine Swelling    IV iodine (states now that this can be tolerated with Benadryl)  . Morphine And Related Nausea And Vomiting and Nausea Only    Hallucinations (also)  . Oxycodone Nausea And Vomiting  . Sulfa Antibiotics Other (See Comments)    Dizzy/Fainting  . Adhesive [Tape] Rash and Other (See Comments)    Including Bandaids    Current Medications: Current Outpatient Medications  Medication Sig Dispense Refill  . acetaminophen (TYLENOL) 650 MG CR tablet Take 650 mg by mouth every 8 (eight) hours as needed.     Marland Kitchen amLODipine (NORVASC) 5 MG tablet TAKE 1 TABLET BY MOUTH ONCE DAILY 90 tablet 0  . aspirin (RA ASPIRIN) 325 MG tablet Take 1 tablet (325 mg total) by mouth daily. 90 tablet 0  . atorvastatin (LIPITOR) 40 MG tablet Take 1 tablet (40 mg total) by mouth daily. 90 tablet 3  . chlorthalidone (HYGROTON) 25 MG tablet Take 0.5 tablets (12.5 mg total) by mouth daily. 90 tablet 3  . lansoprazole (PREVACID) 30 MG capsule TAKE 1 CAPSULE BY MOUTH ONCE DAILY 30 capsule 0  . levothyroxine (SYNTHROID, LEVOTHROID) 88 MCG tablet take 1 tablet by mouth every morning ON AN EMPTY STOMACH 90 tablet 3  . Lutein 10 MG TABS Take 1 tablet by mouth daily.     . mometasone (ELOCON) 0.1 % cream APPLY TO AFFECTED AREA ON CHIN AND ELBOWS TWICE A DAY UNTIL CLEAR, THEN AS NEEDED FOR FLARES  0  . Multiple Vitamin (MULTIVITAMIN) tablet Take 1 tablet by mouth daily.     No current facility-administered medications for this visit.     Review of Systems General:  no complaints Skin: no complaints Eyes: no complaints HEENT: no  complaints Breasts: no complaints Pulmonary: no complaints Cardiac: lower extremity swelling Gastrointestinal: no  complaints Genitourinary/Sexual: no complaints Ob/Gyn: no complaints Musculoskeletal: no complaints Hematology: no complaints Neurologic/Psych: no complaints   Objective:  Physical Examination:  BP 124/77   Pulse 85   Temp 97.6 F (36.4 C) (Tympanic)   Resp 18   Ht 5\' 3"  (1.6 m)   Wt 244 lb 14.4 oz (111.1 kg)   BMI 43.38 kg/m    Body mass index is 43.38 kg/m.  ECOG Performance Status: 0 - Asymptomatic   GENERAL: Patient is a well appearing female in no acute distress HEENT:  Sclerae anicteric.  Oropharynx clear and moist. Neck is supple.  LUNGS:  Clear to auscultation bilaterally.  No wheezes or rhonchi. HEART:  Regular rate and rhythm. No murmur appreciated. ABDOMEN:  Soft, nontender.  Positive, normoactive bowel sounds.  MSK:  No focal spinal tenderness to palpation. Full range of motion bilaterally in the upper extremities. EXTREMITIES:  1+ BLE edema. Mild redness of left ankle (per pt 'bumped' it recently) SKIN:  Clear with no obvious rashes or skin changes. No nail dyscrasia. NEURO:  Nonfocal. Well oriented.  Appropriate affect.  Pelvic: Exam Chaperoned by RN Vulva: vulvar scars well healed after prior resections. Scar from previous biopsy at 9:00. No new lesions. Vagina: normal. Bimanual: no masses. Cervix/Uterus: surgically absent   Assessment:  Bailey Brooks is a 67 y.o. female diagnosed with recurrent Paget's disease of the vulva, s/p WLE with positive margins and subsequent recurrence 07/2015. Repeat WLE consistent with recurrent Paget's disease of the vulva s/p WLE with negative margins. Biopsy in 2018 negative. Clinically asymptomatic today and NED on exam for Paget's.    History of recurrent vulvar candidiasis, resolved after 6 month course of diflucan. No evidence of candidiasis on exam today and asymptomatic.   Plan:   Problem List Items Addressed This Visit      Musculoskeletal and Integument   Paget disease, extra mammary - Primary (Chronic)      Discussed continued close surveillance for Paget's disease with repeat exam in 6 months. If she were to develop gross evidence of recurrent Paget's we could consider Aldara therapy.   She will continue to follow up with PCP regarding leg swelling. We also discussed elevation of limbs at night, ambulating once every hour while sitting for work, and consideration of graduated compression garments as tolerated.   She will continue to follow up with regular mammograms yearly and annual skin checks with dermatology.   I recommended continued close surveillance for Paget's disease with repeat exam and visit in 6 months. If she develops gross evidence of recurrent Paget's disease, then we can consider Aldara therapy.   She will RTC in 6 months. Patient does request female providers only.    Beckey Rutter, DNP, AGNP-C [place] [facility] [place] (work cell) [phone] (office)   Medical screening examination/treatment/procedure(s) was performed by non-physician practitioner and as supervising physician, Dr. Fransisca Connors, was immediately available for consultation/collaboration.

## 2018-07-17 NOTE — Progress Notes (Signed)
Pt having swelling of feet and ankles and sees PCP for it. No gyn concerns

## 2018-07-23 ENCOUNTER — Other Ambulatory Visit: Payer: Self-pay | Admitting: Family Medicine

## 2018-08-15 ENCOUNTER — Telehealth: Payer: Self-pay | Admitting: Family Medicine

## 2018-08-15 ENCOUNTER — Other Ambulatory Visit: Payer: Self-pay

## 2018-08-15 MED ORDER — ASPIRIN 325 MG PO TABS: 325.0000 mg | ORAL_TABLET | Freq: Every day | ORAL | 0 refills | Status: DC

## 2018-08-15 NOTE — Telephone Encounter (Signed)
Copied from [place] [id number]. Topic: Quick Communication - Rx Refill/Question >> Aug 15, 2018 10:16 AM Waylan Rocher, Lumin L wrote: Medication: aspirin (RA ASPIRIN) 325 MG tablet (patient wants a call back from cma to find out why when her pharmacy faxes, she does not get her medications)  Has the patient contacted their pharmacy? Yes.   (Agent: If no, request that the patient contact the pharmacy for the refill.) (Agent: If yes, when and what did the pharmacy advise?) Say's pharmacy faxed 3 times starting last week,  and has not heard anything.   Preferred Pharmacy (with phone number or street name): [id number] - [place], [place] AT [place] [address] [place] [address] Phone: [phone] Fax: [phone]   Agent: Please be advised that RX refills may take up to 3 business days. We ask that you follow-up with your pharmacy.

## 2018-08-15 NOTE — Telephone Encounter (Signed)
Medication refill for Ra Asprin 325 has been sent to [facility] [address]

## 2018-08-23 ENCOUNTER — Other Ambulatory Visit: Payer: Self-pay | Admitting: Family Medicine

## 2018-08-29 ENCOUNTER — Other Ambulatory Visit: Payer: Self-pay | Admitting: Family Medicine

## 2018-09-07 ENCOUNTER — Other Ambulatory Visit: Payer: Self-pay | Admitting: Family Medicine

## 2018-09-29 ENCOUNTER — Other Ambulatory Visit: Payer: Self-pay | Admitting: Family Medicine

## 2018-10-01 ENCOUNTER — Other Ambulatory Visit: Payer: Self-pay | Admitting: Family Medicine

## 2018-10-01 MED ORDER — LANSOPRAZOLE 30 MG PO CPDR: 30.0000 mg | DELAYED_RELEASE_CAPSULE | Freq: Every day | ORAL | 0 refills | Status: DC

## 2018-10-21 ENCOUNTER — Other Ambulatory Visit: Payer: Self-pay | Admitting: Family Medicine

## 2018-11-04 ENCOUNTER — Other Ambulatory Visit: Payer: Self-pay | Admitting: Family Medicine

## 2018-11-04 MED ORDER — LANSOPRAZOLE 30 MG PO CPDR: 30.0000 mg | DELAYED_RELEASE_CAPSULE | Freq: Every day | ORAL | 0 refills | Status: DC

## 2018-11-08 DIAGNOSIS — R21 Rash and other nonspecific skin eruption: Secondary | ICD-10-CM | POA: Diagnosis not present

## 2018-11-08 DIAGNOSIS — L820 Inflamed seborrheic keratosis: Secondary | ICD-10-CM | POA: Diagnosis not present

## 2018-11-12 ENCOUNTER — Other Ambulatory Visit: Payer: Self-pay | Admitting: Family Medicine

## 2018-11-12 NOTE — Telephone Encounter (Signed)
Pt out of medication since Saturday

## 2018-11-12 NOTE — Telephone Encounter (Signed)
Copied from [place] [id number]. Topic: Quick Communication - Rx Refill/Question >> Nov 12, 2018 11:01 AM Rutherford Nail, NT wrote: **Patient states that the pharmacy has sent 2 faxes to the office, one on Saturday and one yesterday. Patient has been out of this medication since Saturday. Please advise.   Medication: levothyroxine (SYNTHROID, LEVOTHROID) 88 MCG tablet   Has the patient contacted their pharmacy? Yes.  To contact office since there are no refills (Agent: If no, request that the patient contact the pharmacy for the refill.) (Agent: If yes, when and what did the pharmacy advise?)  Preferred Pharmacy (with phone number or street name): [facility] [place], [place]: Please be advised that RX refills may take up to 3 business days. We ask that you follow-up with your pharmacy.

## 2018-11-14 ENCOUNTER — Other Ambulatory Visit: Payer: Self-pay

## 2018-11-14 MED ORDER — LEVOTHYROXINE SODIUM 88 MCG PO TABS: ORAL_TABLET | ORAL | 0 refills | Status: DC

## 2018-11-14 NOTE — Telephone Encounter (Signed)
Requested Prescriptions  Pending Prescriptions Disp Refills  . levothyroxine (SYNTHROID, LEVOTHROID) 88 MCG tablet 90 tablet 0    Sig: take 1 tablet by mouth every morning ON AN EMPTY STOMACH     Endocrinology:  Hypothyroid Agents Failed - 11/12/2018  3:40 PM      Failed - TSH needs to be rechecked within 3 months after an abnormal result. Refill until TSH is due.      Passed - TSH in normal range and within 360 days    TSH  Date Value Ref Range Status  06/18/2018 3.85 0.35 - 4.50 uIU/mL Final         Passed - Valid encounter within last 12 months    Recent Outpatient Visits          4 months ago Hypothyroidism, unspecified type   [facility] [place], Angela Adam, MD   11 months ago Essential hypertension   [place], Angela Adam, MD   1 year ago Hypothyroidism, unspecified type   [facility] Leone Haven, MD   1 year ago Essential hypertension   [place] Leone Haven, MD   1 year ago Acute right-sided low back pain without sciatica   [facility] C[facility] Bis, Angela Adam, MD      Future Appointments            In 1 month Caryl Bis, Angela Adam, MD [facility]

## 2018-11-18 ENCOUNTER — Other Ambulatory Visit: Payer: Self-pay | Admitting: Family Medicine

## 2018-11-18 NOTE — Telephone Encounter (Signed)
Copied from [place] [id number]. Topic: Quick Communication - Rx Refill/Question >> Nov 18, 2018 11:57 AM Bea Graff, NT wrote: Medication: aspirin (RA ASPIRIN) 325 MG tablet   Has the patient contacted their pharmacy? Yes.   (Agent: If no, request that the patient contact the pharmacy for the refill.) (Agent: If yes, when and what did the pharmacy advise?)  Preferred Pharmacy (with phone number or street name): [id number] - [place], [place] [phone] (Phone) [phone] (Fax)    Agent: Please be advised that RX refills may take up to 3 business days. We ask that you follow-up with your pharmacy.

## 2018-12-04 ENCOUNTER — Other Ambulatory Visit: Payer: Self-pay | Admitting: Family Medicine

## 2018-12-04 HISTORY — PX: BREAST BIOPSY: SHX20

## 2018-12-05 MED ORDER — LANSOPRAZOLE 30 MG PO CPDR: 30.0000 mg | DELAYED_RELEASE_CAPSULE | Freq: Every day | ORAL | 0 refills | Status: DC

## 2018-12-20 ENCOUNTER — Encounter: Payer: Self-pay | Admitting: Family Medicine

## 2018-12-20 ENCOUNTER — Ambulatory Visit: Payer: BLUE CROSS/BLUE SHIELD | Admitting: Family Medicine

## 2018-12-20 VITALS — BP 122/82 | HR 62 | Temp 98.5°F | Ht 63.0 in | Wt 248.0 lb

## 2018-12-20 DIAGNOSIS — Z1159 Encounter for screening for other viral diseases: Secondary | ICD-10-CM | POA: Diagnosis not present

## 2018-12-20 DIAGNOSIS — Z8673 Personal history of transient ischemic attack (TIA), and cerebral infarction without residual deficits: Secondary | ICD-10-CM | POA: Diagnosis not present

## 2018-12-20 DIAGNOSIS — M25561 Pain in right knee: Secondary | ICD-10-CM

## 2018-12-20 DIAGNOSIS — Z23 Encounter for immunization: Secondary | ICD-10-CM

## 2018-12-20 DIAGNOSIS — E039 Hypothyroidism, unspecified: Secondary | ICD-10-CM

## 2018-12-20 DIAGNOSIS — I10 Essential (primary) hypertension: Secondary | ICD-10-CM | POA: Diagnosis not present

## 2018-12-20 DIAGNOSIS — R7303 Prediabetes: Secondary | ICD-10-CM | POA: Insufficient documentation

## 2018-12-20 DIAGNOSIS — E785 Hyperlipidemia, unspecified: Secondary | ICD-10-CM | POA: Diagnosis not present

## 2018-12-20 DIAGNOSIS — Z1211 Encounter for screening for malignant neoplasm of colon: Secondary | ICD-10-CM

## 2018-12-20 DIAGNOSIS — Z1239 Encounter for other screening for malignant neoplasm of breast: Secondary | ICD-10-CM

## 2018-12-20 DIAGNOSIS — G8929 Other chronic pain: Secondary | ICD-10-CM

## 2018-12-20 LAB — LIPID PANEL
Cholesterol: 145 mg/dL (ref 0–200)
HDL: 40.8 mg/dL (ref >39.00)
LDL Cholesterol: 77 mg/dL (ref 0–99)
NonHDL: 103.84
TRIGLYCERIDES: 134 mg/dL (ref 0.0–149.0)
Total CHOL/HDL Ratio: 4
VLDL: 26.8 mg/dL (ref 0.0–40.0)

## 2018-12-20 LAB — TSH: TSH: 3.35 u[IU]/mL (ref 0.35–4.50)

## 2018-12-20 LAB — COMPREHENSIVE METABOLIC PANEL
ALT: 11 U/L (ref 0–35)
AST: 17 U/L (ref 0–37)
Albumin: 3.7 g/dL (ref 3.5–5.2)
Alkaline Phosphatase: 96 U/L (ref 39–117)
BUN: 18 mg/dL (ref 6–23)
CO2: 33 mEq/L — ABNORMAL HIGH (ref 19–32)
CREATININE: 0.87 mg/dL (ref 0.40–1.20)
Calcium: 9.9 mg/dL (ref 8.4–10.5)
Chloride: 100 mEq/L (ref 96–112)
GFR: 64.58 mL/min (ref >60.00)
Glucose, Bld: 105 mg/dL — ABNORMAL HIGH (ref 70–99)
Potassium: 3.7 mEq/L (ref 3.5–5.1)
Sodium: 140 mEq/L (ref 135–145)
TOTAL PROTEIN: 7 g/dL (ref 6.0–8.3)
Total Bilirubin: 0.5 mg/dL (ref 0.2–1.2)

## 2018-12-20 LAB — HEMOGLOBIN A1C: Hgb A1c MFr Bld: 6 % (ref 4.6–6.5)

## 2018-12-20 MED ORDER — LISINOPRIL 10 MG PO TABS: 10.0000 mg | ORAL_TABLET | Freq: Every day | ORAL | 3 refills | Status: DC

## 2018-12-20 MED ORDER — LANSOPRAZOLE 30 MG PO CPDR: 30.0000 mg | DELAYED_RELEASE_CAPSULE | Freq: Every day | ORAL | 1 refills | Status: DC

## 2018-12-20 NOTE — Assessment & Plan Note (Signed)
Well-controlled.  Amlodipine could be contributing to her swelling.  She will discontinue this.  We will start her on lisinopril.  I discussed the potential for cough and angioedema with this and discussed discontinuing this medication if she develops angioedema symptoms and being evaluated if this occurs.  She will have lab work today and return in 1 week for repeat BMP.  1 month for BP check with nursing.

## 2018-12-20 NOTE — Progress Notes (Signed)
[name], MD Phone: [phone]  Bailey Brooks is a 68 y.o. female who presents today for follow-up.  CC: Hypertension, hyperlipidemia, history of TIA, obesity, right knee pain  Hypertension: Not checking at home.  Taking amlodipine and chlorthalidone.  No chest pain or shortness of breath.  She does have chronic bilateral lower leg edema.  No orthopnea or PND.  She wonders if we can change the amlodipine to something else to see if that will help with the swelling.  Hyperlipidemia: Taking Lipitor.  No right upper quadrant pain or myalgias.  History of TIA: She notes no recurrent symptoms.  She notes her insurance will no longer pay for aspirin 325 mg daily.  Obesity: She is going to start doing weight watchers.  She is not exercising.  Right knee pain: She notes she saw orthopedics and received an injection.  She has not had any recurrence of her pain.  Social History   Tobacco Use  Smoking Status Never Smoker  Smokeless Tobacco Never Used     ROS see history of present illness  Objective  Physical Exam Vitals:   12/20/18 0820  BP: 122/82  Pulse: 62  Temp: 98.5 F (36.9 C)    BP Readings from Last 3 Encounters:  12/20/18 122/82  07/17/18 124/77  07/04/18 124/78   Wt Readings from Last 3 Encounters:  12/20/18 248 lb (112.5 kg)  07/17/18 244 lb 14.4 oz (111.1 kg)  06/18/18 244 lb 3.2 oz (110.8 kg)    Physical Exam Constitutional:      General: She is not in acute distress.    Appearance: She is not diaphoretic.  Cardiovascular:     Rate and Rhythm: Normal rate and regular rhythm.     Heart sounds: Normal heart sounds.  Pulmonary:     Effort: Pulmonary effort is normal.     Breath sounds: Normal breath sounds.  Skin:    General: Skin is warm and dry.  Neurological:     Mental Status: She is alert.     Comments: CN 2-12 intact, 5/5 strength in bilateral biceps, triceps, grip, quads, hamstrings, plantar and dorsiflexion, sensation to light  touch intact in bilateral UE and LE, normal gait      Assessment/Plan: Please see individual problem list.  History of transient ischemic attack (TIA) No recurrent symptoms.  She will switch to aspirin 81 mg daily.  Hyperlipidemia Continue Lipitor.  Check lipid panel.  Hypothyroidism Check TSH.  Continue Synthroid.  Morbid obesity due to excess calories ([place]) Encouraged starting weight watchers and starting to walk for exercise.  Check A1c.  Essential hypertension Well-controlled.  Amlodipine could be contributing to her swelling.  She will discontinue this.  We will start her on lisinopril.  I discussed the potential for cough and angioedema with this and discussed discontinuing this medication if she develops angioedema symptoms and being evaluated if this occurs.  She will have lab work today and return in 1 week for repeat BMP.  1 month for BP check with nursing.  Chronic pain of right knee Improved with injection.     Health Maintenance: Prevnar given.  Mammogram ordered.  Orders Placed This Encounter  Procedures  . MM 3D SCREEN BREAST BILATERAL    Standing Status:   Future    Standing Expiration Date:   02/18/2020    Scheduling Instructions:     Schedule at [facility]    Order Specific Question:   Reason for Exam (SYMPTOM  OR DIAGNOSIS REQUIRED)  Answer:   breast cancer screening    Order Specific Question:   Preferred imaging location?    Answer:   External  . Pneumococcal conjugate vaccine 13-valent  . Comp Met (CMET)  . Lipid panel  . HgB A1c  . TSH  . Hepatitis C Antibody  . Ambulatory referral to Gastroenterology    Referral Priority:   Routine    Referral Type:   Consultation    Referral Reason:   Specialty Services Required    Number of Visits Requested:   1    Meds ordered this encounter  Medications  . lisinopril (PRINIVIL,ZESTRIL) 10 MG tablet    Sig: Take 1 tablet (10 mg total) by mouth daily.    Dispense:  90 tablet    Refill:  3  . lansoprazole  (PREVACID) 30 MG capsule    Sig: Take 1 capsule (30 mg total) by mouth daily.    Dispense:  90 capsule    Refill:  1     [name], MD [place]

## 2018-12-20 NOTE — Assessment & Plan Note (Signed)
Improved with injection

## 2018-12-20 NOTE — Assessment & Plan Note (Signed)
Check TSH.  Continue Synthroid. 

## 2018-12-20 NOTE — Assessment & Plan Note (Signed)
Encouraged starting weight watchers and starting to walk for exercise.  Check A1c.

## 2018-12-20 NOTE — Patient Instructions (Signed)
Nice to see you. Please discontinue your amlodipine.  We will start you on lisinopril.  If you develop any side effects from this please let us know. We will get you set up for your mammogram. Please are taking an 81 mg aspirin daily. We will refer you to GI. We will check lab work today and contact you with results.

## 2018-12-20 NOTE — Assessment & Plan Note (Signed)
No recurrent symptoms.  She will switch to aspirin 81 mg daily.

## 2018-12-20 NOTE — Assessment & Plan Note (Signed)
-

## 2018-12-21 LAB — HEPATITIS C ANTIBODY
Hepatitis C Ab: NONREACTIVE
SIGNAL TO CUT-OFF: 0.02 (ref <1.00)

## 2018-12-26 ENCOUNTER — Telehealth: Payer: Self-pay | Admitting: Radiology

## 2018-12-26 DIAGNOSIS — I10 Essential (primary) hypertension: Secondary | ICD-10-CM

## 2018-12-26 NOTE — Telephone Encounter (Signed)
Pt coming in for labs tomorrow, please place future orders. Thank you.  

## 2018-12-27 ENCOUNTER — Other Ambulatory Visit (INDEPENDENT_AMBULATORY_CARE_PROVIDER_SITE_OTHER): Payer: BLUE CROSS/BLUE SHIELD

## 2018-12-27 DIAGNOSIS — I10 Essential (primary) hypertension: Secondary | ICD-10-CM

## 2018-12-27 LAB — BASIC METABOLIC PANEL
BUN: 21 mg/dL (ref 6–23)
CO2: 32 mEq/L (ref 19–32)
Calcium: 9.3 mg/dL (ref 8.4–10.5)
Chloride: 100 mEq/L (ref 96–112)
Creatinine, Ser: 0.89 mg/dL (ref 0.40–1.20)
GFR: 62.9 mL/min (ref >60.00)
Glucose, Bld: 139 mg/dL — ABNORMAL HIGH (ref 70–99)
Potassium: 3.6 mEq/L (ref 3.5–5.1)
SODIUM: 141 meq/L (ref 135–145)

## 2019-01-03 DIAGNOSIS — L820 Inflamed seborrheic keratosis: Secondary | ICD-10-CM | POA: Diagnosis not present

## 2019-01-03 DIAGNOSIS — R21 Rash and other nonspecific skin eruption: Secondary | ICD-10-CM | POA: Diagnosis not present

## 2019-01-03 DIAGNOSIS — D485 Neoplasm of uncertain behavior of skin: Secondary | ICD-10-CM | POA: Diagnosis not present

## 2019-01-03 DIAGNOSIS — D225 Melanocytic nevi of trunk: Secondary | ICD-10-CM | POA: Diagnosis not present

## 2019-01-09 ENCOUNTER — Other Ambulatory Visit: Payer: Self-pay | Admitting: Family Medicine

## 2019-01-14 ENCOUNTER — Telehealth: Payer: Self-pay | Admitting: Family Medicine

## 2019-01-14 ENCOUNTER — Other Ambulatory Visit: Payer: Self-pay

## 2019-01-14 MED ORDER — LANSOPRAZOLE 30 MG PO CPDR: 30.0000 mg | DELAYED_RELEASE_CAPSULE | Freq: Every day | ORAL | 1 refills | Status: DC

## 2019-01-14 NOTE — Telephone Encounter (Signed)
Medication sent to requested pharmacy.

## 2019-01-14 NOTE — Telephone Encounter (Signed)
Copied from [place] [phone]. Topic: Quick Communication - Rx Refill/Question >> Jan 14, 2019 10:11 AM Virl Axe D wrote: Medication: lansoprazole (PREVACID) 30 MG capsule / Pt called and stated they contact pharmacy for refill but there was an issue  Has the patient contacted their pharmacy? Yes.   (Agent: If no, request that the patient contact the pharmacy for the refill.) (Agent: If yes, when and what did the pharmacy advise?)  Preferred Pharmacy (with phone number or street name): [id number] - [place], [place] - [address] [phone] (Phone) [phone] (Fax)  Agent: Please be advised that RX refills may take up to 3 business days. We ask that you follow-up with your pharmacy.

## 2019-01-15 ENCOUNTER — Other Ambulatory Visit: Payer: Self-pay | Admitting: Family Medicine

## 2019-01-22 DIAGNOSIS — Z86010 Personal history of colonic polyps: Secondary | ICD-10-CM | POA: Diagnosis not present

## 2019-01-22 DIAGNOSIS — K449 Diaphragmatic hernia without obstruction or gangrene: Secondary | ICD-10-CM | POA: Diagnosis not present

## 2019-01-22 DIAGNOSIS — K209 Esophagitis, unspecified: Secondary | ICD-10-CM | POA: Diagnosis not present

## 2019-01-22 DIAGNOSIS — K2980 Duodenitis without bleeding: Secondary | ICD-10-CM | POA: Diagnosis not present

## 2019-01-23 ENCOUNTER — Ambulatory Visit (INDEPENDENT_AMBULATORY_CARE_PROVIDER_SITE_OTHER): Payer: BLUE CROSS/BLUE SHIELD | Admitting: *Deleted

## 2019-01-23 ENCOUNTER — Encounter: Payer: Self-pay | Admitting: *Deleted

## 2019-01-23 VITALS — BP 128/80 | HR 70 | Resp 16

## 2019-01-23 DIAGNOSIS — I10 Essential (primary) hypertension: Secondary | ICD-10-CM

## 2019-01-23 NOTE — Progress Notes (Signed)
BP looks great. Will forward to PCP

## 2019-01-23 NOTE — Progress Notes (Signed)
Patient here for nurse visit BP check per order from 12/20/18  Patient reports compliance with prescribed BP medications: yes  Last dose of BP medication: 7:30 AM  BP Readings from Last 3 Encounters:  01/23/19 128/80  12/20/18 122/82  07/17/18 124/77   Pulse Readings from Last 3 Encounters:  01/23/19 70  12/20/18 62  07/17/18 85      Patient verbalized understanding of instructions.   Kerin Salen, LPN

## 2019-01-29 ENCOUNTER — Inpatient Hospital Stay: Payer: BLUE CROSS/BLUE SHIELD | Attending: Obstetrics and Gynecology | Admitting: Obstetrics and Gynecology

## 2019-01-29 ENCOUNTER — Other Ambulatory Visit: Payer: Self-pay

## 2019-01-29 VITALS — BP 126/72 | HR 80 | Temp 98.1°F | Resp 18 | Wt 242.0 lb

## 2019-01-29 DIAGNOSIS — E785 Hyperlipidemia, unspecified: Secondary | ICD-10-CM | POA: Diagnosis not present

## 2019-01-29 DIAGNOSIS — Z8673 Personal history of transient ischemic attack (TIA), and cerebral infarction without residual deficits: Secondary | ICD-10-CM | POA: Insufficient documentation

## 2019-01-29 DIAGNOSIS — K219 Gastro-esophageal reflux disease without esophagitis: Secondary | ICD-10-CM | POA: Diagnosis not present

## 2019-01-29 DIAGNOSIS — B373 Candidiasis of vulva and vagina: Secondary | ICD-10-CM | POA: Insufficient documentation

## 2019-01-29 DIAGNOSIS — Z8544 Personal history of malignant neoplasm of other female genital organs: Secondary | ICD-10-CM | POA: Diagnosis not present

## 2019-01-29 DIAGNOSIS — I110 Hypertensive heart disease with heart failure: Secondary | ICD-10-CM | POA: Insufficient documentation

## 2019-01-29 DIAGNOSIS — R7303 Prediabetes: Secondary | ICD-10-CM | POA: Diagnosis not present

## 2019-01-29 DIAGNOSIS — E039 Hypothyroidism, unspecified: Secondary | ICD-10-CM | POA: Insufficient documentation

## 2019-01-29 DIAGNOSIS — Z9079 Acquired absence of other genital organ(s): Secondary | ICD-10-CM | POA: Insufficient documentation

## 2019-01-29 DIAGNOSIS — E7800 Pure hypercholesterolemia, unspecified: Secondary | ICD-10-CM | POA: Diagnosis not present

## 2019-01-29 DIAGNOSIS — Z08 Encounter for follow-up examination after completed treatment for malignant neoplasm: Secondary | ICD-10-CM

## 2019-01-29 DIAGNOSIS — C4499 Other specified malignant neoplasm of skin, unspecified: Secondary | ICD-10-CM

## 2019-01-29 DIAGNOSIS — Z7982 Long term (current) use of aspirin: Secondary | ICD-10-CM | POA: Insufficient documentation

## 2019-01-29 DIAGNOSIS — Z79899 Other long term (current) drug therapy: Secondary | ICD-10-CM | POA: Diagnosis not present

## 2019-01-29 NOTE — Progress Notes (Signed)
Gynecologic Oncology Interval Visit   Referring Provider: Dr. Kenton Brooks  Chief Concern: Recurrent paget's disease of the vulva  Subjective:  Bailey Brooks is a 68 y.o. female, diagnosed with Paget's of the vulva, s/p WLE and vulvar biopsy 09/29/2015, returns to clinic today for surveillance.   She was last seen in clinic by Bailey Rutter, NP on 07/17/2018. Asymptomatic at that time. NED on exam.   Today, she denies any new lesions or vulvar itching or irritation. She has no complaints at all.    Gynecologic Oncology History: Bailey Brooks has a history of localized vulvar Paget's disease.   04/2013- vulvar biopsy revealed Paget's disease               WLE, additional margins resected for positive disease on frozen evaluation.   Part A: VULVA, VAGINAL MARGIN:  - POSITIVE FOR EXTRAMAMMARY PAGET'S DISEASE  Part B: VULVA, POSTERIOR MARGIN:  - POSITIVE FOR EXTRAMAMMARY PAGET'S DISEASE  Part C: VULVA, RIGHT, PARTIAL VULVECTOMY:  - EXTRAMAMMARY PAGET'S DISEASE  Part D: VULVA, NEW VAGINAL MARGIN:  - POSITIVE FOR EXTRAMAMMARY PAGET'S DISEASE  07/28/2015 for routine surveillance an area on the right vulva seemed more suspicious for recurrence. This was biopsied and confirmed recurrent Paget's disease.   She underwent repeat excision 09/29/15 A. VULVA; EXCISION:  - RARE CYTOKERATIN 7 POSITIVE INTRAEPIDERMAL CELLS, INTERPRETED AS RESIDUAL PAGET DISEASE.  - THE SURGICAL MARGINS ARE CLEAR.   B. VULVA, 11:00; BIOPSY:  - RARE CYTOKERATIN 7 POSITIVE INTRAEPIDERMAL CELLS, INTERPRETED AS PAGET DISEASE.   No additional issues since surgery.   She had vulvar biopsy on 05/09/17 which was negative:   DIAGNOSIS:  A. VULVA; BIOPSY:  - SKIN WITH FOCAL KERATOSIS AND HYPERGRANULOSIS - NEGATIVE FOR DYSPLASIA AND MALIGNANCY  Of note, she has had a h/o recurrent vulvar candidiasis. Prior to her vulvar surgery 09/2015 she received 5 weekly doses of oral diflucan for vulvar candidiasis. Postop she was  treated for yeast infection again in 12/17 and then took one Diflucan a week for six months with good results.   Seen in gyn-onc clinic on 01/16/18 by Dr. Theora Brooks. NED at that time.   She has swelling of her lower extremities which was evaluated by her PCP and thought to be related to amlodipine. She started lasix which has improved her symptoms. She was seen by Dr. 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    Problem List: Patient Active Problem List   Diagnosis Date Noted  . Prediabetes 12/20/2018  . Macular degeneration 06/20/2018  . GERD (gastroesophageal reflux disease) 06/20/2018  . Hypersomnia 06/20/2018  . Chronic pain of right knee 06/20/2018  . Bruising 12/14/2017  . 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 Vulvar lesion 05/16/2017  . Low back pain 04/03/2017  . History of transient ischemic attack (TIA) 02/10/2017  . Exertional shortness of breath 11/06/2016  . Right hip pain 08/03/2016  . Essential hypertension 07/06/2016  . Left knee pain 06/28/2016  . Hypomagnesemia 03/02/2016  . Morbid obesity due to excess calories ([place]) 03/02/2016  . Adjustment disorder with mixed anxiety and depressed mood 05/25/2015  . 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 Eczema 02/27/2013  . Muscle cramps 11/13/2012  . Personal history of lymphoma 02/20/2012  . Hypothyroidism 02/20/2012  . Hyperlipidemia 02/20/2012    Past Medical History: Past Medical History:  Diagnosis Date  . 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 Allergic rhinitis   . Arthritis   . CHF (congestive heart failure) ([place])   . Diastolic dysfunction    a. 11/2016 Echo: EF 60-65%, no rwma, mild LVH, Gr1 DD, triv MR, mildly dil LA, nl RV fxn.  . Diffuse large B cell lymphoma [facility]) Nov  2011   Dr Bailey Brooks, Dr. Madelynn Brooks s/p RCHOP and methotrexate, c/b renal failure  . Diverticulosis   . Esophagitis   . GERD (gastroesophageal reflux disease)   . H/O stem cell transplant ([place]) 06/2011   a. in setting of lymphoma.  . Hemorrhoids   . History of chemotherapy 22-Oct-2010   RHCOP/methotrexate-intrathecal  . History of stress test    a. 05/2010 Myoview: nl EF, no ischemia/infarct.  . Hypercholesterolemia   . Hypertension   . Hypothyroidism   . Morbid obesity ([place])   . Paget disease, extra mammary    vulva, s/p resection 2014, Dr. Sabra Brooks  . Pagets disease, extra-mammary   . TIA (transient ischemic attack)     Past Surgical History: Past Surgical History:  Procedure Laterality Date  . ABDOMINAL HYSTERECTOMY  1985   Hysterectomy-partial  . BREAST BIOPSY  2002   Neg - AT [facility]  . BURR HOLE W/ PLACEMENT OMMAYA RESERVOIR    . BURR HOLE W/ PLACEMENT OMMAYA RESERVOIR  2012  . COLONOSCOPY  04/2013  . ESOPHAGOGASTRODUODENOSCOPY  04/2013  . INJECTION KNEE  07/02/2018  . INSERTION CENTRAL VENOUS ACCESS DEVICE W/ SUBCUTANEOUS PORT  2011   Port a Cath: Right chest Double Lumen, 04-Nov-2010  . LIMBAL STEM CELL TRANSPLANT  2012  . LIVER BIOPSY     stage 4B large Bcell lymphoma  . ROTATOR CUFF REPAIR Right 2016  . SHOULDER ARTHROSCOPY Right 06/22/2015   Procedure: ARTHROSCOPY SHOULDER, parital repair of rotator cuff, biceps tenodesis, decompression and debridement;  Surgeon: Corky Mull, MD;  Location: [facility] ORS;  Service: Orthopedics;  Laterality: Right;  . TONSILLECTOMY  1954  . VULVECTOMY    . VULVECTOMY PARTIAL N/A 09/29/2015   Procedure: VULVECTOMY PARTIAL;  Surgeon: Bailey Ends, MD;  Location: [facility] ORS;  Service: Gynecology;  Laterality: N/A;    Past Gynecologic History:  S/p hysterectomy  OB History:  OB History  No obstetric history on file.    Family History: Family History  Problem Relation Age of Onset  . Diabetes Mother        died @ 77 of MI.   Bailey Brooks Hypertension Mother   . Heart attack Mother   . Melanoma Father 34       died of complications r/t melanoma w/ lung mets.  . Kidney disease Sister        Kidney removed   . Prostate cancer Brother        Prostate - dx in 43's  . Heart disease Brother 52       reported MI @ age 55, ? treated w/ TPA->no recurrent CAD, now in 98's.  Bailey Brooks Hearing loss Maternal Aunt   . Hearing loss Maternal Grandfather     Social History: Social History   Socioeconomic History  . Marital status: Married    Spouse name: Not on file  . Number of children: 2  . Years of education: Not on file  . Highest education level: Not on file  Occupational History  . 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 Financial resource strain: Not on file  . Food insecurity:    Worry: Not on file    Inability: Not on file  . Transportation needs:    Medical: Not on file    Non-medical: Not on file  Tobacco Use  . Smoking status: Never Smoker  . Smokeless tobacco: Never Used  Substance and Sexual Activity  . Alcohol use: No    Alcohol/week: 0.0 standard drinks    Comment: former user-no current use  . Drug use: No  . Sexual activity: Yes    Partners: Male    Birth control/protection: Post-menopausal  Lifestyle  . Physical activity:    Days per week: Not on file    Minutes per session: Not on file  . Stress: Not on file  Relationships  . Social connections:    Talks on phone: Not on file    Gets together: Not on file    Attends religious service: Not on file    Active member of club or organization: Not on file    Attends meetings of clubs or organizations: Not on file    Relationship status: Not on file  . Intimate partner violence:    Fear of current or ex partner: Not on file    Emotionally abused: Not on file    Physically abused: Not on file    Forced sexual activity: Not on file  Other Topics Concern  . Not on file  Social History Narrative    Lives in [place] with husband, has 2 grown sons.  No pets. 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   Right-handed   Caffeine: occasional caffeine free/diet soda or hot tea    Allergies: Allergies  Allergen Reactions  . Erythromycin Nausea And Vomiting  . Iodine Swelling    IV iodine (states now that this can be tolerated with Benadryl)  . Morphine And Related Nausea And Vomiting and Nausea Only    Hallucinations (also)  . Oxycodone Nausea And Vomiting  . Sulfa Antibiotics Other (See Comments)    Dizzy/Fainting  . Adhesive [Tape] Rash and Other (See Comments)    Including Bandaids    Current Medications: Current Outpatient Medications  Medication Sig Dispense Refill  . aspirin EC 81 MG tablet Take 81 mg by mouth daily.    Bailey Brooks atorvastatin (LIPITOR) 40 MG tablet Take 1 tablet (40 mg total) by mouth daily. 90 tablet 3  . chlorthalidone (HYGROTON) 25 MG tablet TAKE 1/2 TABLET BY MOUTH DAILY 90 tablet 0  . clobetasol cream (TEMOVATE) 0.05 %     . lansoprazole (PREVACID) 30 MG capsule TAKE 1 CAPSULE(30 MG) BY MOUTH DAILY 30 capsule 3  . levothyroxine (SYNTHROID, LEVOTHROID) 88 MCG tablet take 1 tablet by mouth every morning ON AN EMPTY STOMACH 90 tablet 0  . lisinopril (PRINIVIL,ZESTRIL) 10 MG tablet Take 1 tablet (10 mg total) by mouth daily. 90 tablet 3  . Lutein 10 MG TABS Take 1 tablet by mouth daily.     . mometasone (ELOCON) 0.1 % cream APPLY TO AFFECTED AREA ON CHIN AND ELBOWS TWICE A DAY UNTIL CLEAR, THEN AS NEEDED FOR FLARES  0  . Multiple Vitamin (MULTIVITAMIN) tablet Take 1 tablet by mouth daily.    Bailey Brooks acetaminophen (TYLENOL) 650 MG CR tablet Take 650 mg by mouth every 8 (eight) hours as needed.      No current facility-administered medications for this visit.     Review of Systems General:  no complaints Skin:  no complaints Eyes: no complaints HEENT: no complaints Breasts: no complaints Pulmonary: no complaints Cardiac: no complaints Gastrointestinal: no  complaints Genitourinary/Sexual: no complaints Ob/Gyn: no complaints Musculoskeletal: no complaints Hematology: no complaints Neurologic/Psych: no complaints   Objective:  Physical Examination:  BP 126/72 (BP Location: Left Arm, Patient Position: Sitting)   Pulse 80 Comment: manually   Temp 98.1 F (36.7 C) (Tympanic)   Resp 18   Wt 242 lb (109.8 kg)   BMI 42.87 kg/m    Body mass index is 42.87 kg/m.  ECOG Performance Status: 0 - Asymptomatic   GENERAL: Patient is a well appearing female in no acute distress HEENT:  Sclerae anicteric.  Oropharynx clear and moist. Neck is supple.  LUNGS:  Clear to auscultation bilaterally.  No wheezes or rhonchi. HEART:  Regular rate and rhythm. No murmur appreciated. ABDOMEN:  Soft, nontender.  Non distended. No ascites, hepatosplenomegaly, no masses.  MSK:  No focal spinal tenderness to palpation. s. SKIN:  Clear with no obvious rashes or skin changes. No nail dyscrasia. NEURO:  Nonfocal. Well oriented.  Appropriate affect.  Pelvic: Exam Chaperoned by RN Vulva: vulvar scars well healed after prior resections. Findings unchanged. Sharp demarcation between mucosa/vulvar tissue and epithelial tissue. Erythema present which may represent candidiasis. No new lesions. Vagina: normal. Bimanual: no masses. Cervix/Uterus: surgically absent   Assessment:  KAYANI RAPAPORT is a 68 y.o. female diagnosed with recurrent Paget's disease of the vulva, s/p WLE with positive margins and subsequent recurrence 07/2015. Repeat WLE consistent with recurrent Paget's disease of the vulva s/p WLE with negative margins. Biopsy in 2018 negative. Clinically asymptomatic today and no evidence of recurrent disease on exam for Paget's.    History of recurrent vulvar candidiasis, resolved after 6 month course of diflucan. Possible recurrent candidiasis on exam but asymptomatic.    Plan:   Problem List Items Addressed This Visit      Musculoskeletal and Integument    Paget disease, extra mammary - Primary (Chronic)     Discussed continued close surveillance for Paget's disease with repeat exam in 6 months. If she were to develop gross evidence of recurrent Paget's we could consider Aldara therapy.   She will continue to follow up with regular mammograms yearly and annual skin checks with dermatology.   I recommended continued close surveillance for Paget's disease with repeat exam and visit in 6 months. If she develops gross evidence of recurrent Paget's disease, then we can consider Aldara therapy.   She will RTC in 6 months. Patient does request female providers only.    Bailey Rutter, DNP, AGNP-C [place] [facility] [phone] (work cell) [phone] (office)   Ms. Allen scribed the note for me. I performed the visit, history, pelvic exam, assessment/plan and decision making.   A total of 15 minutes were spent with the patient/family today; >50% was spent in education, counseling and coordination of care for Paget's.   [place] Gaetana Michaelis, MD

## 2019-01-29 NOTE — Progress Notes (Signed)
Here for follow up. Stated " feeling great "

## 2019-02-24 ENCOUNTER — Other Ambulatory Visit: Payer: Self-pay | Admitting: Family Medicine

## 2019-03-31 ENCOUNTER — Ambulatory Visit
Admission: RE | Admit: 2019-03-31 | Payer: BLUE CROSS/BLUE SHIELD | Source: Home / Self Care | Admitting: Unknown Physician Specialty

## 2019-03-31 ENCOUNTER — Encounter: Admission: RE | Payer: Self-pay | Source: Home / Self Care

## 2019-03-31 SURGERY — COLONOSCOPY WITH PROPOFOL
Anesthesia: General

## 2019-04-21 ENCOUNTER — Ambulatory Visit (INDEPENDENT_AMBULATORY_CARE_PROVIDER_SITE_OTHER): Payer: BLUE CROSS/BLUE SHIELD | Admitting: Family Medicine

## 2019-04-21 ENCOUNTER — Other Ambulatory Visit: Payer: Self-pay

## 2019-04-21 ENCOUNTER — Encounter: Payer: Self-pay | Admitting: Family Medicine

## 2019-04-21 DIAGNOSIS — C8333 Diffuse large B-cell lymphoma, intra-abdominal lymph nodes: Secondary | ICD-10-CM

## 2019-04-21 DIAGNOSIS — E039 Hypothyroidism, unspecified: Secondary | ICD-10-CM

## 2019-04-21 DIAGNOSIS — E785 Hyperlipidemia, unspecified: Secondary | ICD-10-CM

## 2019-04-21 DIAGNOSIS — I10 Essential (primary) hypertension: Secondary | ICD-10-CM

## 2019-04-21 DIAGNOSIS — C4499 Other specified malignant neoplasm of skin, unspecified: Secondary | ICD-10-CM

## 2019-04-21 NOTE — Progress Notes (Signed)
Virtual Visit via video Note  This visit type was conducted due to national recommendations for restrictions regarding the COVID-19 pandemic (e.g. social distancing).  This format is felt to be most appropriate for this patient at this time.  All issues noted in this document were discussed and addressed.  No physical exam was performed (except for noted visual exam findings with Video Visits).   I connected with Bailey Brooks today at 10:00 AM EDT by a video enabled telemedicine application and verified that I am speaking with the correct person using two identifiers. Location patient: home Location provider: work  Persons participating in the virtual visit: patient, provider  I discussed the limitations, risks, security and privacy concerns of performing an evaluation and management service by telephone and the availability of in person appointments. I also discussed with the patient that there may be a patient responsible charge related to this service. The patient expressed understanding and agreed to proceed.  Reason for visit: follow-up  HPI: HYPERTENSION  Disease Monitoring  Home BP Monitoring 120's/60's  Chest pain- no    Dyspnea- no Medications  Compliance-  Taking chlorthalidone, lisinopril.  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  She continues to see GYN-ONC.  Lymphoma: Patient notes she was released by her oncologist last year.  Based on review of his note she does not need any further imaging or special testing.  They discussed surveillance with them versus myself  and the patient opted for surveillance with me.  She has not noted any enlarged lymph nodes in her armpits or neck.  No night sweats or weight loss.    ROS: See pertinent positives and negatives per HPI.  Past Medical History:  Diagnosis Date  . 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 Allergic rhinitis   . Arthritis   . CHF (congestive heart failure) ([place])   . Diastolic dysfunction    a. 11/2016 Echo: EF 60-65%, no rwma, mild LVH, Gr1 DD, triv MR, mildly dil LA, nl RV fxn.  . 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 Madelynn Done s/p RCHOP and methotrexate, c/b renal failure  . Diverticulosis   . Esophagitis   . GERD (gastroesophageal reflux disease)   . H/O stem cell transplant ([facility]) 06/2011   a. in setting of lymphoma.  . Hemorrhoids   . History of chemotherapy 22-Oct-2010   RHCOP/methotrexate-intrathecal  . History of stress test    a. 05/2010 Myoview: nl EF, no ischemia/infarct.  . Hypercholesterolemia   . Hypertension   . Hypothyroidism   . Morbid obesity ([place])   . Paget disease, extra mammary    vulva, s/p resection 2014, Dr. Sabra Heck  . Pagets disease, extra-mammary   . TIA (transient ischemic attack)     Past Surgical History:  Procedure Laterality Date  . ABDOMINAL HYSTERECTOMY  1985   Hysterectomy-partial  . BREAST BIOPSY  2002   Neg - AT [facility]  . BURR HOLE W/ PLACEMENT OMMAYA RESERVOIR    . BURR HOLE W/ PLACEMENT OMMAYA RESERVOIR  2012  . COLONOSCOPY  04/2013  . ESOPHAGOGASTRODUODENOSCOPY  04/2013  . INJECTION KNEE  07/02/2018  . INSERTION CENTRAL VENOUS ACCESS DEVICE W/ SUBCUTANEOUS PORT  2011   Port a Cath: Right chest Double Lumen, 04-Nov-2010  . LIMBAL STEM CELL TRANSPLANT  2012  . LIVER BIOPSY     stage 4B large Bcell lymphoma  . ROTATOR CUFF REPAIR Right 2016  . SHOULDER ARTHROSCOPY Right 06/22/2015   Procedure: ARTHROSCOPY SHOULDER, parital repair of rotator cuff, biceps  tenodesis, decompression and debridement;  Surgeon: Corky Mull, MD;  Location: [facility] ORS;  Service: Orthopedics;  Laterality: Right;  . TONSILLECTOMY  1954  . VULVECTOMY    . VULVECTOMY PARTIAL N/A 09/29/2015   Procedure: VULVECTOMY PARTIAL;  Surgeon: Gillis Ends, MD;  Location: [facility] ORS;  Service: Gynecology;  Laterality: N/A;    Family History  Problem Relation Age of Onset  . Diabetes Mother        died @ 65 of MI.  Marland Kitchen Hypertension Mother   . Heart attack Mother   . Melanoma Father 52       died of complications r/t melanoma w/ lung mets.  . Kidney disease Sister        Kidney removed   . Prostate cancer Brother        Prostate - dx in 52's  . Heart disease Brother 22       reported MI @ age 81, ? treated w/ TPA->no recurrent CAD, now in 12's.  Marland Kitchen Hearing loss Maternal Aunt   . Hearing loss Maternal Grandfather     SOCIAL HX: nonsmoker   Current Outpatient Medications:  .  acetaminophen (TYLENOL) 650 MG CR tablet, Take 650 mg by mouth every 8 (eight) hours as needed. , Disp: , Rfl:  .  aspirin EC 81 MG tablet, Take 81 mg by mouth daily., Disp: , Rfl:  .  atorvastatin (LIPITOR) 40 MG tablet, TAKE 1 TABLET(40 MG) BY MOUTH DAILY, Disp: 90 tablet, Rfl: 3 .  chlorthalidone (HYGROTON) 25 MG tablet, TAKE 1/2 TABLET BY MOUTH DAILY, Disp: 90 tablet, Rfl: 0 .  clobetasol cream (TEMOVATE) 0.05 %, , Disp: , Rfl:  .  lansoprazole (PREVACID) 30 MG capsule, TAKE 1 CAPSULE(30 MG) BY MOUTH DAILY, Disp: 30 capsule, Rfl: 3 .  levothyroxine (SYNTHROID) 88 MCG tablet, take 1 tablet by mouth every morning ON AN EMPTY STOMACH, Disp: 90 tablet, Rfl: 3 .  lisinopril (PRINIVIL,ZESTRIL) 10 MG tablet, Take 1 tablet (10 mg total) by mouth daily., Disp: 90 tablet, Rfl: 3 .  Lutein 10 MG TABS, Take 1 tablet by mouth daily. , Disp: , Rfl:  .  mometasone (ELOCON) 0.1 % cream, APPLY TO AFFECTED AREA ON CHIN AND ELBOWS TWICE A DAY UNTIL CLEAR, THEN AS NEEDED FOR FLARES, Disp: , Rfl: 0 .  Multiple  Vitamin (MULTIVITAMIN) tablet, Take 1 tablet by mouth daily., Disp: , Rfl:   EXAM:  VITALS per patient if applicable: none  GENERAL: alert, oriented, appears well and in no acute distress  HEENT: atraumatic, conjunttiva clear, no obvious abnormalities on inspection of external nose and ears  NECK: normal movements of the head and neck  LUNGS: on inspection no signs of respiratory distress, breathing rate appears normal, no obvious gross SOB, gasping or wheezing  CV: no obvious cyanosis  MS: moves all visible extremities without noticeable abnormality  PSYCH/NEURO: pleasant and cooperative, no obvious depression or anxiety, speech and thought processing grossly intact  ASSESSMENT AND PLAN:  Discussed the  following assessment and plan:  Essential hypertension - Plan: Basic Metabolic Panel (BMET)  Hypothyroidism, unspecified type  Paget disease, extra mammary  Diffuse large b-cell lymphoma, intra-abdominal lymph nodes ([place]) - Plan: CBC, Lactate Dehydrogenase (LDH)  Hyperlipidemia, unspecified hyperlipidemia type  Essential hypertension Well-controlled.  Continue current regimen.  She will come in for lab work in about a month.  Hypothyroidism Continue Synthroid.  Refill given.  Paget disease, extra mammary She will continue to follow with GYN.  Diffuse large b-cell lymphoma, intra-abdominal lymph nodes (HCC) We will plan to do an exam on her in 4 months at follow-up.  She will have a CBC and LDH ordered as well.  Hyperlipidemia Continue Lipitor.  CMA will contact patient to get her scheduled for lab work in 1 month and follow-up in 4 months.  Social distancing precautions and sick precautions given regarding COVID-19.   I discussed the assessment and treatment plan with the patient. The patient was provided an opportunity to ask questions and all were answered. The patient agreed with the plan and demonstrated an understanding of the instructions.   The patient was  advised to call back or seek an in-person evaluation if the symptoms worsen or if the condition fails to improve as anticipated.   [name], MD

## 2019-04-22 ENCOUNTER — Telehealth: Payer: Self-pay | Admitting: Family Medicine

## 2019-04-22 MED ORDER — LEVOTHYROXINE SODIUM 88 MCG PO TABS: ORAL_TABLET | ORAL | 3 refills | Status: DC

## 2019-04-22 NOTE — Assessment & Plan Note (Signed)
Continue Synthroid.  Refill given.

## 2019-04-22 NOTE — Assessment & Plan Note (Signed)
Well-controlled.  Continue current regimen.  She will come in for lab work in about a month.

## 2019-04-22 NOTE — Assessment & Plan Note (Signed)
-  Continue Lipitor °

## 2019-04-22 NOTE — Assessment & Plan Note (Signed)
We will plan to do an exam on her in 4 months at follow-up.  She will have a CBC and LDH ordered as well.

## 2019-04-22 NOTE — Telephone Encounter (Signed)
Please contact the patient and get her set up for follow-up in the office in 4 months.  She also needs labs to be completed in 1 month.  Orders placed.  Thanks.

## 2019-04-22 NOTE — Assessment & Plan Note (Signed)
She will continue to follow with GYN 

## 2019-04-23 NOTE — Telephone Encounter (Signed)
Called and spoke with pt. Pt has been scheduled.

## 2019-06-02 ENCOUNTER — Other Ambulatory Visit (INDEPENDENT_AMBULATORY_CARE_PROVIDER_SITE_OTHER): Payer: BC Managed Care – PPO

## 2019-06-02 ENCOUNTER — Other Ambulatory Visit: Payer: Self-pay

## 2019-06-02 DIAGNOSIS — I10 Essential (primary) hypertension: Secondary | ICD-10-CM

## 2019-06-02 DIAGNOSIS — C8333 Diffuse large B-cell lymphoma, intra-abdominal lymph nodes: Secondary | ICD-10-CM | POA: Diagnosis not present

## 2019-06-02 LAB — BASIC METABOLIC PANEL
BUN: 17 mg/dL (ref 6–23)
CO2: 33 mEq/L — ABNORMAL HIGH (ref 19–32)
Calcium: 9 mg/dL (ref 8.4–10.5)
Chloride: 99 mEq/L (ref 96–112)
Creatinine, Ser: 0.83 mg/dL (ref 0.40–1.20)
GFR: 68.09 mL/min (ref >60.00)
Glucose, Bld: 111 mg/dL — ABNORMAL HIGH (ref 70–99)
Potassium: 3.6 mEq/L (ref 3.5–5.1)
Sodium: 139 mEq/L (ref 135–145)

## 2019-06-02 LAB — CBC
HCT: 40.4 % (ref 36.0–46.0)
Hemoglobin: 13.3 g/dL (ref 12.0–15.0)
MCHC: 32.9 g/dL (ref 30.0–36.0)
MCV: 86.9 fl (ref 78.0–100.0)
Platelets: 227 10*3/uL (ref 150.0–400.0)
RBC: 4.65 Mil/uL (ref 3.87–5.11)
RDW: 15.2 % (ref 11.5–15.5)
WBC: 5 10*3/uL (ref 4.0–10.5)

## 2019-06-03 DIAGNOSIS — D1800 Hemangioma unspecified site: Secondary | ICD-10-CM | POA: Diagnosis not present

## 2019-06-03 DIAGNOSIS — D692 Other nonthrombocytopenic purpura: Secondary | ICD-10-CM | POA: Diagnosis not present

## 2019-06-03 DIAGNOSIS — Z1283 Encounter for screening for malignant neoplasm of skin: Secondary | ICD-10-CM | POA: Diagnosis not present

## 2019-06-03 DIAGNOSIS — D2271 Melanocytic nevi of right lower limb, including hip: Secondary | ICD-10-CM | POA: Diagnosis not present

## 2019-06-03 LAB — LACTATE DEHYDROGENASE: LDH: 130 U/L (ref 120–250)

## 2019-06-09 ENCOUNTER — Encounter: Payer: Self-pay | Admitting: Podiatry

## 2019-06-09 ENCOUNTER — Ambulatory Visit: Payer: Self-pay | Admitting: Podiatry

## 2019-06-09 ENCOUNTER — Other Ambulatory Visit: Payer: Self-pay

## 2019-06-09 DIAGNOSIS — B351 Tinea unguium: Secondary | ICD-10-CM | POA: Insufficient documentation

## 2019-06-09 DIAGNOSIS — M79674 Pain in right toe(s): Secondary | ICD-10-CM

## 2019-06-09 DIAGNOSIS — M79675 Pain in left toe(s): Secondary | ICD-10-CM

## 2019-06-09 NOTE — Progress Notes (Signed)
Complaint:  Visit Type: Patient presents  to my office for  preventative foot care services. Complaint: Patient states" my big  nails have grown long and thick and become painful to walk and wear shoes" The patient presents for preventative foot care services. No changes to ROS  Podiatric Exam: Vascular: dorsalis pedis and posterior tibial pulses are palpable bilateral. Capillary return is immediate. Temperature gradient is WNL. Skin turgor WNL  Sensorium: Normal Semmes Weinstein monofilament test. Normal tactile sensation bilaterally. Nail Exam: Pt has thick disfigured discolored nails with subungual debris noted bilateral hallux nails and second right . Ulcer Exam: There is no evidence of ulcer or pre-ulcerative changes or infection. Orthopedic Exam: Muscle tone and strength are WNL. No limitations in general ROM. No crepitus or effusions noted. Foot type and digits show no abnormalities. Bony prominences are unremarkable. Skin: No Porokeratosis. No infection or ulcers  Diagnosis:  Onychomycosis, , Pain in right toe, pain in left toes  Treatment & Plan Procedures and Treatment: Consent by patient was obtained for treatment procedures.   Debridement of mycotic and hypertrophic toenails, 1 through 5 bilateral and clearing of subungual debris. No ulceration, no infection noted.  Return Visit-Office Procedure: Patient instructed to return to the office for a follow up visit prn for continued evaluation and treatment.    Gardiner Barefoot DPM

## 2019-07-30 ENCOUNTER — Ambulatory Visit: Payer: BLUE CROSS/BLUE SHIELD

## 2019-08-04 ENCOUNTER — Other Ambulatory Visit: Payer: Self-pay

## 2019-08-06 ENCOUNTER — Encounter: Payer: Self-pay | Admitting: Family Medicine

## 2019-08-06 ENCOUNTER — Other Ambulatory Visit: Payer: Self-pay

## 2019-08-06 ENCOUNTER — Ambulatory Visit (INDEPENDENT_AMBULATORY_CARE_PROVIDER_SITE_OTHER): Payer: BC Managed Care – PPO | Admitting: Family Medicine

## 2019-08-06 VITALS — Ht 63.0 in | Wt 240.0 lb

## 2019-08-06 DIAGNOSIS — Z1239 Encounter for other screening for malignant neoplasm of breast: Secondary | ICD-10-CM | POA: Diagnosis not present

## 2019-08-06 DIAGNOSIS — K219 Gastro-esophageal reflux disease without esophagitis: Secondary | ICD-10-CM | POA: Diagnosis not present

## 2019-08-06 DIAGNOSIS — F439 Reaction to severe stress, unspecified: Secondary | ICD-10-CM | POA: Diagnosis not present

## 2019-08-06 DIAGNOSIS — I10 Essential (primary) hypertension: Secondary | ICD-10-CM

## 2019-08-06 MED ORDER — LANSOPRAZOLE 30 MG PO CPDR: DELAYED_RELEASE_CAPSULE | ORAL | 1 refills | Status: DC

## 2019-08-06 MED ORDER — CHLORTHALIDONE 25 MG PO TABS: 12.5000 mg | ORAL_TABLET | Freq: Every day | ORAL | 1 refills | Status: DC

## 2019-08-06 MED ORDER — LISINOPRIL 10 MG PO TABS: 10.0000 mg | ORAL_TABLET | Freq: Every day | ORAL | 3 refills | Status: DC

## 2019-08-06 NOTE — Assessment & Plan Note (Signed)
Patient is dealing with this fairly well.  She does feel fairly safe at work with COVID-19.  I encouraged her to practice social distancing precautions and safety precautions with wearing a mask and hand sanitizer use.  She will monitor and continue good coping mechanisms.

## 2019-08-06 NOTE — Assessment & Plan Note (Signed)
Mammogram order placed.  Message sent to referral coordinator to send this order to [facility].  Patient will also call to get this scheduled.

## 2019-08-06 NOTE — Assessment & Plan Note (Signed)
Asymptomatic.  Continue Prevacid.  I encouraged her to contact GI to reschedule her EGD and colonoscopy.

## 2019-08-06 NOTE — Progress Notes (Signed)
Virtual Visit via video Note  This visit type was conducted due to national recommendations for restrictions regarding the COVID-19 pandemic (e.g. social distancing).  This format is felt to be most appropriate for this patient at this time.  All issues noted in this document were discussed and addressed.  No physical exam was performed (except for noted visual exam findings with Video Visits).   I connected with Bailey Brooks today at  8:30 AM EDT by a video enabled telemedicine application or telephone and verified that I am speaking with the correct person using two identifiers. Location patient: work Location provider: work Persons participating in the virtual visit: patient, provider  I discussed the limitations, risks, security and privacy concerns of performing an evaluation and management service by telephone and the availability of in person appointments. I also discussed with the patient that there may be a patient responsible charge related to this service. The patient expressed understanding and agreed to proceed.   Reason for visit: follow-up  HPI: HYPERTENSION  Disease Monitoring  Home BP Monitoring 120/80 Chest pain- no    Dyspnea- no Medications  Compliance-  Taking chlorthalidone, lisinopril.  Edema- no  GERD:   Reflux symptoms: no      EGD: Patient was scheduled to have an EGD and colonoscopy though that got delayed given the COVID-19 pandemic.  Medication: Taking Prevacid  Stress: Patient notes she works on [employer].  She is stressed COVID-19 though also about work and other social issues in the world.  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  Everyone is wearing masks.  They have only had 9 cases so far.  She is using hand sanitizer and they are tracking daily symptoms.  When she gets stressed she has some coping mechanisms where she  will talk to herself and things will improve.    ROS: See pertinent positives and negatives per HPI.  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 11/2016 Echo: EF 60-65%, no rwma, mild LVH, Gr1 DD, triv MR, mildly dil LA, nl RV fxn.   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 Madelynn Done s/p RCHOP and methotrexate, c/b renal failure   Diverticulosis    Esophagitis    GERD (gastroesophageal reflux disease)    H/O stem cell transplant ([place]) 06/2011   a. in setting of lymphoma.   Hemorrhoids    History of chemotherapy 22-Oct-2010   RHCOP/methotrexate-intrathecal   History of stress test    a. 05/2010 Myoview: nl EF, no ischemia/infarct.   Hypercholesterolemia    Hypertension    Hypothyroidism    Morbid obesity ([place])    Paget disease, extra mammary    vulva, s/p resection 2014, Dr. Sabra Heck   Pagets disease, extra-mammary    TIA (transient ischemic attack)     Past Surgical History:  Procedure Laterality Date   ABDOMINAL HYSTERECTOMY  1985   Hysterectomy-partial   BREAST BIOPSY  2002   Neg - AT [place]  2012   COLONOSCOPY  04/2013  ESOPHAGOGASTRODUODENOSCOPY  04/2013   INJECTION KNEE  07/02/2018   INSERTION CENTRAL VENOUS ACCESS DEVICE W/ SUBCUTANEOUS PORT  2011   Port a Cath: Right chest Double Lumen, 04-Nov-2010   LIMBAL STEM CELL TRANSPLANT  2012   LIVER BIOPSY     stage 4B large Bcell lymphoma   ROTATOR CUFF REPAIR Right 2016   SHOULDER ARTHROSCOPY Right 06/22/2015   Procedure: ARTHROSCOPY SHOULDER, parital repair of rotator cuff, biceps tenodesis, decompression and debridement;  Surgeon: Corky Mull, MD;  Location: [facility] ORS;  Service:  Orthopedics;  Laterality: Right;   [place] PARTIAL N/A 09/29/2015   Procedure: VULVECTOMY PARTIAL;  Surgeon: Gillis Ends, MD;  Location: [facility] ORS;  Service: Gynecology;  Laterality: N/A;    Family History  Problem Relation Age of Onset   Diabetes Mother        died @ 43 of MI.   Hypertension Mother    Heart attack Mother    Melanoma Father 45       died of complications r/t melanoma w/ lung mets.   Kidney disease Sister        Kidney removed    Prostate cancer Brother        Prostate - dx in 72's   Heart disease Brother 98       reported MI @ age 48, ? treated w/ TPA->no recurrent CAD, now in 55's.   Hearing loss Maternal Aunt    Hearing loss Maternal Grandfather     SOCIAL HX: Non-smoker.   Current Outpatient Medications:    acetaminophen (TYLENOL) 650 MG CR tablet, Take 650 mg by mouth every 8 (eight) hours as needed. , Disp: , Rfl:    aspirin EC 81 MG tablet, Take 81 mg by mouth daily., Disp: , Rfl:    atorvastatin (LIPITOR) 40 MG tablet, TAKE 1 TABLET(40 MG) BY MOUTH DAILY, Disp: 90 tablet, Rfl: 3   chlorthalidone (HYGROTON) 25 MG tablet, Take 0.5 tablets (12.5 mg total) by mouth daily., Disp: 45 tablet, Rfl: 1   clobetasol cream (TEMOVATE) 0.05 %, , Disp: , Rfl:    lansoprazole (PREVACID) 30 MG capsule, TAKE 1 CAPSULE(30 MG) BY MOUTH DAILY, Disp: 90 capsule, Rfl: 1   levothyroxine (SYNTHROID) 88 MCG tablet, take 1 tablet by mouth every morning ON AN EMPTY STOMACH, Disp: 90 tablet, Rfl: 3   lisinopril (ZESTRIL) 10 MG tablet, Take 1 tablet (10 mg total) by mouth daily., Disp: 90 tablet, Rfl: 3   Lutein 10 MG TABS, Take 1 tablet by mouth daily. , Disp: , Rfl:    mometasone (ELOCON) 0.1 % cream, APPLY TO AFFECTED AREA ON CHIN AND ELBOWS TWICE A DAY UNTIL CLEAR, THEN AS NEEDED FOR FLARES, Disp: , Rfl: 0   Multiple Vitamin (MULTIVITAMIN) tablet, Take 1 tablet by mouth daily., Disp: , Rfl:    EXAM:  VITALS per patient if applicable: None.  GENERAL: alert, oriented, appears well and in no acute distress  HEENT: atraumatic, conjunttiva clear, no obvious abnormalities on inspection of external nose and ears  NECK: normal movements of the head and neck  LUNGS: on inspection no signs of respiratory distress, breathing rate appears normal, no obvious gross SOB, gasping or wheezing  CV: no obvious cyanosis  MS: moves all visible extremities without noticeable abnormality  PSYCH/NEURO: pleasant and cooperative, no obvious depression or anxiety, speech and thought processing grossly intact  ASSESSMENT AND PLAN:  Discussed the following assessment and plan:  Essential hypertension Well-controlled.  Continue current regimen.  GERD (gastroesophageal reflux disease) Asymptomatic.  Continue Prevacid.  I encouraged her to contact GI to reschedule her EGD and colonoscopy.  Stress Patient is dealing with this fairly well.  She does feel fairly safe at work with COVID-19.  I encouraged her to practice social distancing precautions and safety precautions with wearing a mask and hand sanitizer use.  She will monitor and continue good coping mechanisms.  Breast cancer screening Mammogram order placed.  Message sent to referral coordinator to send this order to [place].  Patient will also call to get this scheduled.   Social distancing precautions and sick precautions given regarding COVID-19.   I discussed the assessment and treatment plan with the patient. The patient was provided an opportunity to ask questions and all were answered. The patient agreed with the plan and demonstrated an understanding of the instructions.   The patient was advised to call back or seek an in-person evaluation if the symptoms worsen or if the condition fails to improve as anticipated.   [name], MD

## 2019-08-06 NOTE — Assessment & Plan Note (Signed)
Well-controlled.  Continue current regimen. 

## 2019-08-13 ENCOUNTER — Inpatient Hospital Stay: Payer: BC Managed Care – PPO | Attending: Obstetrics and Gynecology | Admitting: Obstetrics and Gynecology

## 2019-08-13 ENCOUNTER — Other Ambulatory Visit: Payer: Self-pay

## 2019-08-13 VITALS — BP 140/78 | HR 81 | Temp 98.3°F | Resp 18 | Wt 241.9 lb

## 2019-08-13 DIAGNOSIS — R7303 Prediabetes: Secondary | ICD-10-CM | POA: Diagnosis not present

## 2019-08-13 DIAGNOSIS — E785 Hyperlipidemia, unspecified: Secondary | ICD-10-CM | POA: Diagnosis not present

## 2019-08-13 DIAGNOSIS — Z79899 Other long term (current) drug therapy: Secondary | ICD-10-CM | POA: Insufficient documentation

## 2019-08-13 DIAGNOSIS — Z9889 Other specified postprocedural states: Secondary | ICD-10-CM | POA: Insufficient documentation

## 2019-08-13 DIAGNOSIS — I110 Hypertensive heart disease with heart failure: Secondary | ICD-10-CM | POA: Diagnosis not present

## 2019-08-13 DIAGNOSIS — E7800 Pure hypercholesterolemia, unspecified: Secondary | ICD-10-CM | POA: Insufficient documentation

## 2019-08-13 DIAGNOSIS — C519 Malignant neoplasm of vulva, unspecified: Secondary | ICD-10-CM | POA: Diagnosis not present

## 2019-08-13 DIAGNOSIS — K219 Gastro-esophageal reflux disease without esophagitis: Secondary | ICD-10-CM | POA: Insufficient documentation

## 2019-08-13 DIAGNOSIS — Z8673 Personal history of transient ischemic attack (TIA), and cerebral infarction without residual deficits: Secondary | ICD-10-CM | POA: Insufficient documentation

## 2019-08-13 DIAGNOSIS — Z7982 Long term (current) use of aspirin: Secondary | ICD-10-CM | POA: Diagnosis not present

## 2019-08-13 DIAGNOSIS — M1990 Unspecified osteoarthritis, unspecified site: Secondary | ICD-10-CM | POA: Diagnosis not present

## 2019-08-13 DIAGNOSIS — I509 Heart failure, unspecified: Secondary | ICD-10-CM | POA: Insufficient documentation

## 2019-08-13 DIAGNOSIS — Z6841 Body Mass Index (BMI) 40.0 and over, adult: Secondary | ICD-10-CM | POA: Diagnosis not present

## 2019-08-13 DIAGNOSIS — C8338 Diffuse large B-cell lymphoma, lymph nodes of multiple sites: Secondary | ICD-10-CM | POA: Diagnosis not present

## 2019-08-13 DIAGNOSIS — Z90710 Acquired absence of both cervix and uterus: Secondary | ICD-10-CM | POA: Diagnosis not present

## 2019-08-13 DIAGNOSIS — C4499 Other specified malignant neoplasm of skin, unspecified: Secondary | ICD-10-CM | POA: Diagnosis not present

## 2019-08-13 DIAGNOSIS — B373 Candidiasis of vulva and vagina: Secondary | ICD-10-CM | POA: Insufficient documentation

## 2019-08-13 DIAGNOSIS — E039 Hypothyroidism, unspecified: Secondary | ICD-10-CM | POA: Diagnosis not present

## 2019-08-13 DIAGNOSIS — H3530 Unspecified macular degeneration: Secondary | ICD-10-CM | POA: Insufficient documentation

## 2019-08-13 NOTE — Progress Notes (Signed)
Vulvar biopsies x2 taken to pathology.

## 2019-08-13 NOTE — Patient Instructions (Signed)
Vulva Biopsy, Care After This sheet gives you information about how to care for yourself after your procedure. Your health care provider may also give you more specific instructions. If you have problems or questions, contact your health care provider. What can I expect after the procedure? After the procedure, it is common to have:  Slight bleeding from the biopsy site.  Discomfort at the biopsy site. Follow these instructions at home: Biopsy site care   Follow instructions from your health care provider about how to take care of your biopsy site. Make sure you: ? Clean the area using water and mild soap twice a day or as told by your health care provider. Gently pat the area dry. ? If you were prescribed an antibiotic ointment, apply it as told by your health care provider. Do not stop using the antibiotic even if your condition improves. ? Take a warm water bath (sitz bath) as needed to help with pain and discomfort. A sitz bath is taken while you are sitting down. The water should only come up to your hips and should cover your buttocks. ? Leave stitches (sutures), skin glue, or adhesive strips in place. These skin closures may need to stay in place for 2 weeks or longer. If adhesive strip edges start to loosen and curl up, you may trim the loose edges. Do not remove adhesive strips completely unless your health care provider tells you to do that.  Check your biopsy site every day for signs of infection. Check for: ? More redness, swelling, or pain. ? More fluid or blood. ? Warmth. ? Pus or a bad smell.  Do not rub the biopsy area after urinating. Gently pat the area dry or use a bottle filled with warm water (peri-bottle) to clean the area. Gently wipe from front to back. Lifestyle  Wear loose, cotton underwear. Do not wear tight pants.  Do not use a tampon, douche, or put anything inside your vagina for at least 1 week or until your health care provider approves.  Do not have sex  for at least 1 week or until your health care provider approves.  Do not exercise, such as running or biking, until your health care provider approves.  Do not swim or use a hot tub until your health care provider approves. You may shower or take a sitz bath. General instructions  Take over-the-counter and prescription medicines only as told by your health care provider.  Use a sanitary napkin until the bleeding stops.  Keep all follow-up visits as told by your health care provider. This is important. Contact a health care provider if:  You have more redness, swelling, or pain around your biopsy site.  You have more fluid or blood coming from your biopsy site.  Your biopsy site feels warm to the touch.  Your pain is not controlled with medicine. Get help right away if you have:  Heavy bleeding from the vulva.  Pus or a bad smell coming from your biopsy site.  A fever.  Lower abdominal pain. Summary  After the procedure, it is common to have slight bleeding and discomfort at the biopsy site.  Follow instructions from your health care provider after your biopsy. Make sure you clean the area with water and mild soap. Pat the area dry.  Take sitz baths as needed to help with pain and discomfort. Leave any sutures in place.  Check your biopsy site for signs of infection, which may include more redness, swelling, pain, fluid,  or blood, or feeling warm to the touch.  Get help right away if you have heavy bleeding, a fever, pus or a bad smell, or pain in the lower abdomen. This information is not intended to replace advice given to you by your health care provider. Make sure you discuss any questions you have with your health care provider. Document Released: 11/06/2012 Document Revised: 05/23/2018 Document Reviewed: 05/23/2018 Elsevier Patient Education  2020 [employer]  

## 2019-08-13 NOTE — Progress Notes (Signed)
Gynecologic Oncology Interval Visit   Referring Provider: Dr. Kenton Kingfisher  Chief Concern: Recurrent paget's disease of the vulva  Subjective:  Bailey Brooks is a 68 y.o. female, diagnosed with Paget's of the vulva, s/p WLE and vulvar biopsy 09/29/2015, returns to clinic today for surveillance.   She saw Dr. Theora Gianotti on 01/29/2019 with symptoms concerning for candidiasis at that time but no evidence of recurrent disease.   Today, she says she feels at baseline and denies vulvar itching or irritation. Denies other specific complaints. Her blood pressure medications were recently changes due to swelling secondary to amlodipine. No on lisinopril.     Gynecologic Oncology History: Bailey Brooks has a history of localized vulvar Paget's disease.   04/2013- vulvar biopsy revealed Paget's disease               WLE, additional margins resected for positive disease on frozen evaluation.   Part A: VULVA, VAGINAL MARGIN:  - POSITIVE FOR EXTRAMAMMARY PAGET'S DISEASE  Part B: VULVA, POSTERIOR MARGIN:  - POSITIVE FOR EXTRAMAMMARY PAGET'S DISEASE  Part C: VULVA, RIGHT, PARTIAL VULVECTOMY:  - EXTRAMAMMARY PAGET'S DISEASE  Part D: VULVA, NEW VAGINAL MARGIN:  - POSITIVE FOR EXTRAMAMMARY PAGET'S DISEASE  07/28/2015 for routine surveillance an area on the right vulva seemed more suspicious for recurrence. This was biopsied and confirmed recurrent Paget's disease.   She underwent repeat excision 09/29/15 A. VULVA; EXCISION:  - RARE CYTOKERATIN 7 POSITIVE INTRAEPIDERMAL CELLS, INTERPRETED AS RESIDUAL PAGET DISEASE.  - THE SURGICAL MARGINS ARE CLEAR.   B. VULVA, 11:00; BIOPSY:  - RARE CYTOKERATIN 7 POSITIVE INTRAEPIDERMAL CELLS, INTERPRETED AS PAGET DISEASE.   No additional issues since surgery.   She had vulvar biopsy on 05/09/17 which was negative:   DIAGNOSIS:  A. VULVA; BIOPSY:  - SKIN WITH FOCAL KERATOSIS AND HYPERGRANULOSIS - NEGATIVE FOR DYSPLASIA AND MALIGNANCY  Of note, she has had a h/o  recurrent vulvar candidiasis. Prior to her vulvar surgery 09/2015 she received 5 weekly doses of oral diflucan for vulvar candidiasis. Postop she was treated for yeast infection again in 12/17 and then took one Diflucan a week for six months with good results.   Seen in gyn-onc clinic on 01/16/18 by Dr. Theora Gianotti. NED at that time.   She has swelling of her lower extremities which was evaluated by her PCP and thought to be related to amlodipine. She started lasix which has improved her symptoms. She was seen by Dr. 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    Problem List: Patient Active Problem List   Diagnosis Date Noted  . Stress 08/06/2019  . Breast cancer screening 08/06/2019  . Pain due to onychomycosis of toenails of both feet 06/09/2019  . Prediabetes 12/20/2018  . Macular degeneration 06/20/2018  . GERD (gastroesophageal reflux disease) 06/20/2018  . Hypersomnia 06/20/2018  . Chronic pain of right knee 06/20/2018  . Bruising 12/14/2017  . Diffuse large B cell lymphoma ([place]) 06/11/2017  . Diffuse large b-cell lymphoma, intra-abdominal lymph nodes ([place]) 06/11/2017  . Vulvar lesion 05/16/2017  . Low back pain 04/03/2017  . History of transient ischemic attack (TIA) 02/10/2017  . Exertional shortness of breath 11/06/2016  . Right hip pain 08/03/2016  . Essential hypertension 07/06/2016  . Left knee pain 06/28/2016  . Hypomagnesemia 03/02/2016  . Morbid obesity due to excess calories ([place]) 03/02/2016  . Adjustment disorder with mixed anxiety and depressed mood 05/25/2015  . 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 Eczema 02/27/2013  . Muscle cramps 11/13/2012  . Personal history of lymphoma 02/20/2012  . Hypothyroidism 02/20/2012  . Hyperlipidemia 02/20/2012    Past Medical History: Past Medical History:  Diagnosis Date  . 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 Allergic rhinitis   . Arthritis   . CHF (congestive heart failure) ([place])   . Diastolic dysfunction    a. 11/2016 Echo: EF 60-65%, no rwma, mild LVH, Gr1 DD, triv MR, mildly dil LA, nl RV fxn.  . Diffuse large B cell lymphoma [facility]) Nov 2011   Dr Inez Pilgrim, Dr. Madelynn Done s/p RCHOP and methotrexate, c/b renal failure  . Diverticulosis   . Esophagitis   . GERD (gastroesophageal reflux disease)   . H/O stem cell transplant ([place]) 06/2011   a. in setting of lymphoma.  . Hemorrhoids   . History of chemotherapy 22-Oct-2010   RHCOP/methotrexate-intrathecal  . History of stress test    a. 05/2010 Myoview: nl EF, no ischemia/infarct.  . Hypercholesterolemia   . Hypertension   . Hypothyroidism   . Morbid obesity ([place])   . Paget disease, extra mammary    vulva, s/p resection 2014, Dr. Sabra Heck  . Pagets disease, extra-mammary   . TIA (transient ischemic attack)     Past Surgical History: Past Surgical History:  Procedure Laterality Date  . ABDOMINAL HYSTERECTOMY  1985   Hysterectomy-partial  . BREAST BIOPSY  2002   Neg - AT [facility]  . BURR HOLE W/ PLACEMENT OMMAYA RESERVOIR    . BURR HOLE W/ PLACEMENT OMMAYA RESERVOIR  2012  . COLONOSCOPY  04/2013  . ESOPHAGOGASTRODUODENOSCOPY  04/2013  . INJECTION KNEE  07/02/2018  . INSERTION CENTRAL VENOUS ACCESS DEVICE W/ SUBCUTANEOUS PORT  2011   Port a Cath: Right chest Double Lumen, 04-Nov-2010  . LIMBAL STEM CELL TRANSPLANT  2012  . LIVER BIOPSY     stage 4B large Bcell lymphoma  . ROTATOR CUFF REPAIR Right 2016  . SHOULDER ARTHROSCOPY Right 06/22/2015   Procedure: ARTHROSCOPY SHOULDER, parital repair of rotator cuff, biceps tenodesis, decompression and debridement;  Surgeon: Corky Mull, MD;  Location: [facility] ORS;  Service: Orthopedics;  Laterality: Right;  . TONSILLECTOMY  1954  . VULVECTOMY    . VULVECTOMY PARTIAL N/A 09/29/2015   Procedure: VULVECTOMY PARTIAL;  Surgeon: Gillis Ends, MD;   Location: [facility] ORS;  Service: Gynecology;  Laterality: N/A;    Past Gynecologic History:  S/p hysterectomy  OB History:  OB History  No obstetric history on file.    Family History: Family History  Problem Relation Age of Onset  . Diabetes Mother        died @ 39 of MI.  Marland Kitchen Hypertension Mother   . Heart attack Mother   . Melanoma Father 51       died of complications r/t melanoma w/ lung mets.  . Kidney disease Sister        Kidney removed   . Prostate cancer Brother        Prostate - dx in 28's  . Heart disease Brother 78       reported MI @ age 3, ? treated w/ TPA->no recurrent CAD, now in 84's.  Marland Kitchen Hearing loss Maternal Aunt   . Hearing loss Maternal Grandfather     Social History: Social History   Socioeconomic History  . Marital status: Married    Spouse name: Not on  file  . Number of children: 2  . Years of education: Not on file  . Highest education level: Not on file  Occupational History  . Occupation: Software engineer Records, Sports administrator    Employer: [employer]  . Financial resource strain: Not on file  . Food insecurity    Worry: Not on file    Inability: Not on file  . Transportation needs    Medical: Not on file    Non-medical: Not on file  Tobacco Use  . Smoking status: Never Smoker  . Smokeless tobacco: Never Used  Substance and Sexual Activity  . Alcohol use: No    Alcohol/week: 0.0 standard drinks    Comment: former user-no current use  . Drug use: No  . Sexual activity: Yes    Partners: Male    Birth control/protection: Post-menopausal  Lifestyle  . Physical activity    Days per week: Not on file    Minutes per session: Not on file  . Stress: Not on file  Relationships  . Social Herbalist on phone: Not on file    Gets together: Not on file    Attends religious service: Not on file    Active member of club or organization: Not on file    Attends meetings of clubs or organizations: Not on file     Relationship status: Not on file  . Intimate partner violence    Fear of current or ex partner: Not on file    Emotionally abused: Not on file    Physically abused: Not on file    Forced sexual activity: Not on file  Other Topics Concern  . Not on file  Social History Narrative   Lives in [place] with husband, has 2 grown sons.  No pets. 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   Right-handed   Caffeine: occasional caffeine free/diet soda or hot tea    Allergies: Allergies  Allergen Reactions  . Erythromycin Nausea And Vomiting  . Iodine Swelling    IV iodine (states now that this can be tolerated with Benadryl)  . Morphine And Related Nausea And Vomiting and Nausea Only    Hallucinations (also)  . Oxycodone Nausea And Vomiting  . Sulfa Antibiotics Other (See Comments)    Dizzy/Fainting  . Adhesive [Tape] Rash and Other (See Comments)    Including Bandaids    Current Medications: Current Outpatient Medications  Medication Sig Dispense Refill  . acetaminophen (TYLENOL) 650 MG CR tablet Take 650 mg by mouth every 8 (eight) hours as needed.     Marland Kitchen aspirin EC 81 MG tablet Take 81 mg by mouth daily.    Marland Kitchen atorvastatin (LIPITOR) 40 MG tablet TAKE 1 TABLET(40 MG) BY MOUTH DAILY 90 tablet 3  . chlorthalidone (HYGROTON) 25 MG tablet Take 0.5 tablets (12.5 mg total) by mouth daily. 45 tablet 1  . clobetasol cream (TEMOVATE) 0.05 %     . lansoprazole (PREVACID) 30 MG capsule TAKE 1 CAPSULE(30 MG) BY MOUTH DAILY 90 capsule 1  . levothyroxine (SYNTHROID) 88 MCG tablet take 1 tablet by mouth every morning ON AN EMPTY STOMACH 90 tablet 3  . lisinopril (ZESTRIL) 10 MG tablet Take 1 tablet (10 mg total) by mouth daily. 90 tablet 3  . Lutein 10 MG TABS Take 1 tablet by mouth daily.     . mometasone (ELOCON) 0.1 % cream APPLY TO AFFECTED AREA ON CHIN AND ELBOWS TWICE A DAY UNTIL CLEAR, THEN AS NEEDED  FOR FLARES  0  . Multiple Vitamin (MULTIVITAMIN) tablet Take 1 tablet by mouth daily.     No current  facility-administered medications for this visit.    Review of Systems General:  no complaints Skin: no complaints Eyes: no complaints HEENT: no complaints Breasts: no complaints Pulmonary: no complaints Cardiac: no complaints Gastrointestinal: no complaints Genitourinary/Sexual: no complaints Ob/Gyn: no complaints Musculoskeletal: no complaints Hematology: no complaints Neurologic/Psych: no complaints   Objective:  Physical Examination:  BP 140/78 (BP Location: Left Arm, Patient Position: Sitting, Cuff Size: Normal)   Pulse 81   Temp 98.3 F (36.8 C)   Resp 18   Wt 241 lb 14.4 oz (109.7 kg)   SpO2 97%   BMI 42.85 kg/m    Body mass index is 42.85 kg/m.  ECOG Performance Status: 0 - Asymptomatic   GENERAL: Patient is a well appearing female in no acute distress HEENT:  Sclera clear. Anicteric LUNGS:  Clear to auscultation bilaterally.   HEART:  Regular rate and rhythm.  ABDOMEN:  Soft, nontender.  No hernias, incisions well healed. No masses or ascites EXTREMITIES:  No peripheral edema. Atraumatic. No cyanosis SKIN:  Clear with no obvious rashes or skin changes.  NEURO:  Nonfocal. Well oriented.  Appropriate affect.  Pelvic: Exam Chaperoned by RN Vulva: vulvar scars well healed after prior resections. Findings concerning for increased erythema on the right and an area at 7 o'clock with white/red tissue concerning for Paget's. The sharp demarcation between mucosa/vulvar tissue and epithelial tissue on the right is unchanged. Vagina: normal. Bimanual: no masses. Cervix/Uterus: surgically absent   Procedure Note:  Informed Consent obtained. Time out performed. The area was cleansed with Betadine, patient stated she does not have an allergy to topical Betadine. Biopsy performed at 3 and 7 o'clock after anesthesia obtained with lidocaine gel and local 5 cc lidocaine. Hemostasis excellent with AgNO3. Patient reassessed after procedure and in stable condition. No  complications.   Assessment:  Bailey Brooks is a 68 y.o. female diagnosed with recurrent Paget's disease of the vulva, s/p WLE with positive margins and subsequent recurrence 07/2015. Repeat WLE consistent with recurrent Paget's disease of the vulva s/p WLE with negative margins. Biopsy in 2018 negative. Clinically asymptomatic today. Possible new lesions concerning for recurrent disease on exam for Paget's.    History of recurrent vulvar candidiasis, resolved after 6 month course of diflucan. Possible recurrent candidiasis on exam but asymptomatic.    Plan:   Problem List Items Addressed This Visit      Musculoskeletal and Integument   Paget disease, extra mammary - Primary (Chronic)      Follow up biopsies.   Discussed continued close surveillance for Paget's disease, and if biopsies negative, then repeat exam in 6 months. If biopsies are positive recurrent Paget's we could consider Aldara therapy versus surgery.   Recommended regular mammograms yearly and annual skin checks with dermatology.   She will RTC in 6 months. Patient does request female providers only.    Beckey Rutter, DNP, AGNP-C [place] at [facility] [phone] (work cell) [phone] (office)   Bailey Brooks scribed the note for me. I performed the visit, history, pelvic exam, assessment/plan and decision making.   Greater 25 minutes were spent with the patient/family today; >50% was spent in education, counseling and coordination of care for Paget's.   [place] Gaetana Michaelis, MD

## 2019-08-15 ENCOUNTER — Inpatient Hospital Stay (HOSPITAL_BASED_OUTPATIENT_CLINIC_OR_DEPARTMENT_OTHER): Payer: BC Managed Care – PPO | Admitting: Nurse Practitioner

## 2019-08-15 ENCOUNTER — Other Ambulatory Visit: Payer: Self-pay

## 2019-08-15 DIAGNOSIS — R7303 Prediabetes: Secondary | ICD-10-CM | POA: Diagnosis not present

## 2019-08-15 DIAGNOSIS — H3530 Unspecified macular degeneration: Secondary | ICD-10-CM | POA: Diagnosis not present

## 2019-08-15 DIAGNOSIS — M1990 Unspecified osteoarthritis, unspecified site: Secondary | ICD-10-CM | POA: Diagnosis not present

## 2019-08-15 DIAGNOSIS — E039 Hypothyroidism, unspecified: Secondary | ICD-10-CM | POA: Diagnosis not present

## 2019-08-15 DIAGNOSIS — C8338 Diffuse large B-cell lymphoma, lymph nodes of multiple sites: Secondary | ICD-10-CM | POA: Diagnosis not present

## 2019-08-15 DIAGNOSIS — Z5189 Encounter for other specified aftercare: Secondary | ICD-10-CM

## 2019-08-15 DIAGNOSIS — Z90710 Acquired absence of both cervix and uterus: Secondary | ICD-10-CM | POA: Diagnosis not present

## 2019-08-15 DIAGNOSIS — Z9889 Other specified postprocedural states: Secondary | ICD-10-CM | POA: Diagnosis not present

## 2019-08-15 DIAGNOSIS — C519 Malignant neoplasm of vulva, unspecified: Secondary | ICD-10-CM | POA: Diagnosis not present

## 2019-08-15 DIAGNOSIS — E7800 Pure hypercholesterolemia, unspecified: Secondary | ICD-10-CM | POA: Diagnosis not present

## 2019-08-15 DIAGNOSIS — K219 Gastro-esophageal reflux disease without esophagitis: Secondary | ICD-10-CM | POA: Diagnosis not present

## 2019-08-15 DIAGNOSIS — I110 Hypertensive heart disease with heart failure: Secondary | ICD-10-CM | POA: Diagnosis not present

## 2019-08-15 DIAGNOSIS — B373 Candidiasis of vulva and vagina: Secondary | ICD-10-CM | POA: Diagnosis not present

## 2019-08-15 DIAGNOSIS — Z6841 Body Mass Index (BMI) 40.0 and over, adult: Secondary | ICD-10-CM | POA: Diagnosis not present

## 2019-08-15 DIAGNOSIS — I509 Heart failure, unspecified: Secondary | ICD-10-CM | POA: Diagnosis not present

## 2019-08-15 DIAGNOSIS — E785 Hyperlipidemia, unspecified: Secondary | ICD-10-CM | POA: Diagnosis not present

## 2019-08-15 NOTE — Progress Notes (Signed)
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  She says she feels well and denies specific complaints.  No bleeding after biopsies.  Did have some pain which is well controlled.  She has been keeping the area clean and washing after toileting.  Denies any fevers, bleeding, drainage, increased tenderness.   Review of systems- Review of Systems  Constitutional: Negative for chills, fever and malaise/fatigue.  Respiratory: Negative for cough and shortness of breath.   Cardiovascular: Negative for chest pain and palpitations.  Gastrointestinal: Negative for constipation and diarrhea.  Genitourinary: Negative for dysuria and frequency.  Skin: Negative for itching and rash.  Neurological: Negative for weakness.  All other systems reviewed and are negative.     Allergies  Allergen Reactions  . Erythromycin Nausea And Vomiting  . Iodine Swelling    IV iodine (states now that this can be tolerated with Benadryl)  . Morphine And Related Nausea And  Vomiting and Nausea Only    Hallucinations (also)  . Oxycodone Nausea And Vomiting  . Sulfa Antibiotics Other (See Comments)    Dizzy/Fainting  . Adhesive [Tape] Rash and Other (See Comments)    Including Bandaids    Past Medical History:  Diagnosis Date  . 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 Allergic rhinitis   . Arthritis   . CHF (congestive heart failure) ([place])   . Diastolic dysfunction    a. 11/2016 Echo: EF 60-65%, no rwma, mild LVH, Gr1 DD, triv MR, mildly dil LA, nl RV fxn.  . Diffuse large B cell lymphoma [facility]) Nov 2011   Dr Inez Pilgrim, Dr. Madelynn Done s/p RCHOP and methotrexate, c/b renal failure  . Diverticulosis   . Esophagitis   . GERD (gastroesophageal reflux disease)   . H/O stem cell transplant ([place]) 06/2011   a. in setting of lymphoma.  . Hemorrhoids   . History of chemotherapy 22-Oct-2010   RHCOP/methotrexate-intrathecal  . History of stress test    a. 05/2010 Myoview: nl EF, no ischemia/infarct.  . Hypercholesterolemia   . Hypertension   . Hypothyroidism   . Morbid obesity ([place])   . Paget disease, extra mammary    vulva, s/p resection 2014, Dr. Sabra Heck  . Pagets disease, extra-mammary   . TIA (transient ischemic attack)     Past Surgical History:  Procedure Laterality Date  . ABDOMINAL HYSTERECTOMY  1985   Hysterectomy-partial  . BREAST BIOPSY  2002   Neg - AT [facility]  . BURR HOLE W/ PLACEMENT OMMAYA RESERVOIR    . BURR HOLE W/ PLACEMENT OMMAYA RESERVOIR  2012  . COLONOSCOPY  04/2013  . ESOPHAGOGASTRODUODENOSCOPY  04/2013  . INJECTION KNEE  07/02/2018  . INSERTION CENTRAL VENOUS ACCESS DEVICE W/ SUBCUTANEOUS PORT  2011   Port a Cath: Right chest Double Lumen, 04-Nov-2010  . LIMBAL STEM CELL TRANSPLANT  2012  . LIVER BIOPSY     stage 4B large Bcell lymphoma  . ROTATOR CUFF REPAIR Right 2016  . SHOULDER ARTHROSCOPY Right 06/22/2015   Procedure: ARTHROSCOPY SHOULDER, parital repair of rotator cuff,  biceps tenodesis, decompression and debridement;  Surgeon: Corky Mull, MD;  Location: [facility] ORS;  Service: Orthopedics;  Laterality: Right;  . TONSILLECTOMY  1954  . VULVECTOMY    . VULVECTOMY PARTIAL N/A 09/29/2015   Procedure: VULVECTOMY PARTIAL;  Surgeon: Gillis Ends, MD;  Location: [facility] ORS;  Service: Gynecology;  Laterality: N/A;    Social History   Socioeconomic History  . Marital status: Married    Spouse name: Not on file  . Number of children: 2  . Years of education: Not on file  . Highest education level: Not on file  Occupational History  . Occupation: Software engineer Records, Sports administrator    Employer: [employer]  . Financial resource strain: Not on file  . Food insecurity    Worry: Not on file    Inability: Not on file  . Transportation needs    Medical: Not on file    Non-medical: Not on file  Tobacco Use  . Smoking status: Never Smoker  . Smokeless tobacco: Never Used  Substance and Sexual Activity  . Alcohol use: No    Alcohol/week: 0.0 standard drinks    Comment: former user-no current use  . Drug use: No  . Sexual activity: Yes    Partners: Male    Birth control/protection: Post-menopausal  Lifestyle  . Physical activity    Days per week: Not on file    Minutes per session: Not on file  . Stress: Not on file  Relationships  . Social Herbalist on phone: Not on file    Gets together: Not on file    Attends religious service: Not on file    Active member of club or organization: Not on file    Attends meetings of clubs or organizations: Not on file    Relationship status: Not on file  . Intimate partner violence    Fear of current or ex partner: Not on file    Emotionally abused: Not on file    Physically abused: Not on file    Forced sexual activity: Not on file  Other Topics Concern  . Not on file  Social History Narrative   Lives in [place] with husband, has 2 grown sons.  No pets. 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   Right-handed   Caffeine: occasional caffeine free/diet soda or hot tea    Family History  Problem Relation Age of Onset  . Diabetes Mother        died @ 24 of MI.  Marland Kitchen Hypertension Mother   . Heart attack Mother   . Melanoma Father 71       died of complications r/t melanoma w/ lung mets.  . Kidney disease Sister        Kidney removed   . Prostate cancer Brother        Prostate - dx in 38's  . Heart disease Brother 57       reported MI @ age 18, ?  treated w/ TPA->no recurrent CAD, now in 70's.  Marland Kitchen Hearing loss Maternal Aunt   . Hearing loss Maternal Grandfather      Current Outpatient Medications:  .  acetaminophen (TYLENOL) 650 MG CR tablet, Take 650 mg by mouth every 8 (eight) hours as needed. , Disp: , Rfl:  .  aspirin EC 81 MG tablet, Take 81 mg by mouth daily., Disp: , Rfl:  .  atorvastatin (LIPITOR) 40 MG tablet, TAKE 1 TABLET(40 MG) BY MOUTH DAILY, Disp: 90 tablet, Rfl: 3 .  chlorthalidone (HYGROTON) 25 MG tablet, Take 0.5 tablets (12.5 mg total) by mouth daily., Disp: 45 tablet, Rfl: 1 .  clobetasol cream (TEMOVATE) 0.05 %, , Disp: , Rfl:  .  lansoprazole (PREVACID) 30 MG capsule, TAKE 1 CAPSULE(30 MG) BY MOUTH DAILY, Disp: 90 capsule, Rfl: 1 .  levothyroxine (SYNTHROID) 88 MCG tablet, take 1 tablet by mouth every morning ON AN EMPTY STOMACH, Disp: 90 tablet, Rfl: 3 .  lisinopril (ZESTRIL) 10 MG tablet, Take 1 tablet (10 mg total) by mouth daily., Disp: 90 tablet, Rfl: 3 .  Lutein 10 MG TABS, Take 1 tablet by mouth daily. , Disp: , Rfl:  .  mometasone (ELOCON) 0.1 % cream, APPLY TO AFFECTED AREA ON CHIN AND ELBOWS TWICE A DAY UNTIL CLEAR, THEN AS NEEDED FOR FLARES, Disp: , Rfl: 0 .  Multiple Vitamin (MULTIVITAMIN) tablet, Take 1 tablet by mouth daily., Disp: , Rfl:   Physical exam: There were no vitals filed for this visit. Physical Exam Constitutional:      General: She is not in acute distress.    Appearance: She is obese.  Eyes:     General: No scleral  icterus.    Conjunctiva/sclera: Conjunctivae normal.  Cardiovascular:     Rate and Rhythm: Normal rate and regular rhythm.  Pulmonary:     Effort: Pulmonary effort is normal.     Breath sounds: Normal breath sounds.  Genitourinary:    Comments: Biopsy sites examined- no drainage or evidence of infection. No necrotic tissue seen.  Skin:    General: Skin is warm and dry.  Neurological:     Mental Status: She is alert and oriented to person, place, and time.  Psychiatric:        Mood and Affect: Mood normal.        Behavior: Behavior normal.      CMP Latest Ref Rng & Units 06/02/2019  Glucose 70 - 99 mg/dL 111(H)  BUN 6 - 23 mg/dL 17  Creatinine 0.40 - 1.20 mg/dL 0.83  Sodium 135 - 145 mEq/L 139  Potassium 3.5 - 5.1 mEq/L 3.6  Chloride 96 - 112 mEq/L 99  CO2 19 - 32 mEq/L 33(H)  Calcium 8.4 - 10.5 mg/dL 9.0  Total Protein 6.0 - 8.3 g/dL -  Total Bilirubin 0.2 - 1.2 mg/dL -  Alkaline Phos 39 - 117 U/L -  AST 0 - 37 U/L -  ALT 0 - 35 U/L -   CBC Latest Ref Rng & Units 06/02/2019  WBC 4.0 - 10.5 K/uL 5.0  Hemoglobin 12.0 - 15.0 g/dL 13.3  Hematocrit 36.0 - 46.0 % 40.4  Platelets 150.0 - 400.0 K/uL 227.0    No images are attached to the encounter.  No results found.  Assessment and plan- Patient is a 68 y.o. female with history of recurrent Paget's of vulva who presents to symptom management clinic for recheck of vulvar biopsy sites.  Evidence of healing today and no signs of infection.  Pathology results not yet available.  Discussed that follow-up based on results of pathology.  Continue vulvar biopsy site care as recommended.  Reinforced signs concerning for infection and to notify clinic if she experiences these.  Disposition: -No additional follow-up at this time.  Follow-up based on pathology results and as scheduled.  If complications please contact clinic   Visit Diagnosis 1. Visit for wound check     Patient expressed understanding and was in agreement with  this plan. She also understands that She can call clinic at any time with any questions, concerns, or complaints.   Thank you for allowing me to participate in the care of this very pleasant patient.   Beckey Rutter, DNP, AGNP-C [place] at [place] (work cell) [phone] (office)  CC:

## 2019-08-18 LAB — SURGICAL PATHOLOGY

## 2019-08-20 ENCOUNTER — Ambulatory Visit: Payer: Medicare Other

## 2019-08-20 ENCOUNTER — Other Ambulatory Visit: Payer: Self-pay

## 2019-08-20 DIAGNOSIS — Z23 Encounter for immunization: Secondary | ICD-10-CM

## 2019-08-21 ENCOUNTER — Encounter: Payer: Self-pay | Admitting: Nurse Practitioner

## 2019-08-22 ENCOUNTER — Telehealth: Payer: Self-pay | Admitting: Nurse Practitioner

## 2019-08-22 NOTE — Telephone Encounter (Signed)
Discussed results of biopsy with patient including right biopsy consistent with paget's. Discussed having patient return to clinic for discussion of management options with Dr. Theora Gianotti. Will contact her with appt.

## 2019-08-28 NOTE — Telephone Encounter (Signed)
I called [name] about the Paget's disease on vulvar biopsy results. I discussed options for treament including topical therapy, Moh's surgery, or vulvectomy. She would like to discuss further in clinic next week.    DIAGNOSIS:  A. VULVA, LEFT AT 3:00; PUNCH BIOPSY:  - BENIGN VULVAR TISSUE.  - NEGATIVE FOR DYSPLASIA AND MALIGNANCY.  DIAGNOSIS:  A. VULVA, RIGHT AT 7:00; PUNCH BIOPSY:  - EXTRAMAMMARY PAGET'S DISEASE OF THE VULVA.  - NEGATIVE FOR DYSPLASIA AND MALIGNANCY.   [place]iley [place] Gaetana Michaelis, MD

## 2019-09-02 NOTE — Progress Notes (Signed)
Called patient no answer left message  

## 2019-09-03 ENCOUNTER — Other Ambulatory Visit: Payer: Self-pay

## 2019-09-03 ENCOUNTER — Inpatient Hospital Stay (HOSPITAL_BASED_OUTPATIENT_CLINIC_OR_DEPARTMENT_OTHER): Payer: BC Managed Care – PPO | Admitting: Obstetrics and Gynecology

## 2019-09-03 VITALS — BP 158/85 | HR 68 | Temp 97.9°F | Ht 63.0 in | Wt 244.7 lb

## 2019-09-03 DIAGNOSIS — I509 Heart failure, unspecified: Secondary | ICD-10-CM | POA: Diagnosis not present

## 2019-09-03 DIAGNOSIS — Z6841 Body Mass Index (BMI) 40.0 and over, adult: Secondary | ICD-10-CM | POA: Diagnosis not present

## 2019-09-03 DIAGNOSIS — Z9889 Other specified postprocedural states: Secondary | ICD-10-CM | POA: Diagnosis not present

## 2019-09-03 DIAGNOSIS — B373 Candidiasis of vulva and vagina: Secondary | ICD-10-CM | POA: Diagnosis not present

## 2019-09-03 DIAGNOSIS — C4499 Other specified malignant neoplasm of skin, unspecified: Secondary | ICD-10-CM

## 2019-09-03 DIAGNOSIS — C8338 Diffuse large B-cell lymphoma, lymph nodes of multiple sites: Secondary | ICD-10-CM | POA: Diagnosis not present

## 2019-09-03 DIAGNOSIS — H3530 Unspecified macular degeneration: Secondary | ICD-10-CM | POA: Diagnosis not present

## 2019-09-03 DIAGNOSIS — E785 Hyperlipidemia, unspecified: Secondary | ICD-10-CM | POA: Diagnosis not present

## 2019-09-03 DIAGNOSIS — I110 Hypertensive heart disease with heart failure: Secondary | ICD-10-CM | POA: Diagnosis not present

## 2019-09-03 DIAGNOSIS — M1990 Unspecified osteoarthritis, unspecified site: Secondary | ICD-10-CM | POA: Diagnosis not present

## 2019-09-03 DIAGNOSIS — R7303 Prediabetes: Secondary | ICD-10-CM | POA: Diagnosis not present

## 2019-09-03 DIAGNOSIS — K219 Gastro-esophageal reflux disease without esophagitis: Secondary | ICD-10-CM | POA: Diagnosis not present

## 2019-09-03 DIAGNOSIS — E7800 Pure hypercholesterolemia, unspecified: Secondary | ICD-10-CM | POA: Diagnosis not present

## 2019-09-03 DIAGNOSIS — C519 Malignant neoplasm of vulva, unspecified: Secondary | ICD-10-CM | POA: Diagnosis not present

## 2019-09-03 DIAGNOSIS — E039 Hypothyroidism, unspecified: Secondary | ICD-10-CM | POA: Diagnosis not present

## 2019-09-03 DIAGNOSIS — Z90710 Acquired absence of both cervix and uterus: Secondary | ICD-10-CM | POA: Diagnosis not present

## 2019-09-03 NOTE — Progress Notes (Signed)
Gynecologic Oncology Interval Visit   Referring Provider: Dr. Kenton Brooks  Chief Concern: Recurrent paget's disease of the vulva  Subjective:  Bailey Brooks is a 68 y.o. female, diagnosed with Paget's of the vulva, s/p WLE and vulvar biopsy 09/29/2015, returns to clinic today for surveillance.   9/9/20202 exam revealed findings concerning for recurrent pagets. Biopsies were obtained:   DIAGNOSIS:  A. VULVA, LEFT AT 3:00; PUNCH BIOPSY:  - BENIGN VULVAR TISSUE.  - NEGATIVE FOR DYSPLASIA AND MALIGNANCY.  DIAGNOSIS:  A. VULVA, RIGHT AT 7:00; PUNCH BIOPSY:  - EXTRAMAMMARY PAGET'S DISEASE OF THE VULVA.  - NEGATIVE FOR DYSPLASIA AND MALIGNANCY.   She was seen following biopsy for evaluation of healing with no evidence of necrotic tissue. Pain was improved.   Dr. Theora Brooks discussed results with patient including topical therapy vs Mohs surgery vs vulvectomy. Patient wished to discuss further in clinic.     Gynecologic Oncology History: Bailey Brooks has a history of localized vulvar Paget's disease.   04/2013- vulvar biopsy revealed Paget's disease               WLE, additional margins resected for positive disease on frozen evaluation.   Part A: VULVA, VAGINAL MARGIN:  - POSITIVE FOR EXTRAMAMMARY PAGET'S DISEASE  Part B: VULVA, POSTERIOR MARGIN:  - POSITIVE FOR EXTRAMAMMARY PAGET'S DISEASE  Part C: VULVA, RIGHT, PARTIAL VULVECTOMY:  - EXTRAMAMMARY PAGET'S DISEASE  Part D: VULVA, NEW VAGINAL MARGIN:  - POSITIVE FOR EXTRAMAMMARY PAGET'S DISEASE  07/28/2015 for routine surveillance an area on the right vulva seemed more suspicious for recurrence. This was biopsied and confirmed recurrent Paget's disease.   She underwent repeat excision 09/29/15 A. VULVA; EXCISION:  - RARE CYTOKERATIN 7 POSITIVE INTRAEPIDERMAL CELLS, INTERPRETED AS RESIDUAL PAGET DISEASE.  - THE SURGICAL MARGINS ARE CLEAR.   B. VULVA, 11:00; BIOPSY:  - RARE CYTOKERATIN 7 POSITIVE INTRAEPIDERMAL CELLS, INTERPRETED  AS PAGET DISEASE.   No additional issues since surgery.   She had vulvar biopsy on 05/09/17 which was negative:   DIAGNOSIS:  A. VULVA; BIOPSY:  - SKIN WITH FOCAL KERATOSIS AND HYPERGRANULOSIS - NEGATIVE FOR DYSPLASIA AND MALIGNANCY  Of note, she has had a h/o recurrent vulvar candidiasis. Prior to her vulvar surgery 09/2015 she received 5 weekly doses of oral diflucan for vulvar candidiasis. Postop she was treated for yeast infection again in 12/17 and then took one Diflucan a week for six months with good results.   Seen in gyn-onc clinic on 01/16/18 by Dr. Theora Brooks. NED at that time.   She has swelling of her lower extremities which was evaluated by her PCP and thought to be related to amlodipine. She started lasix which has improved her symptoms. She was seen by Dr. 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   She saw Dr. Theora Brooks on 01/29/2019 with symptoms concerning for candidiasis at that time but no evidence of recurrent disease.   Her blood pressure medications were recently changes due to swelling secondary to amlodipine. No on lisinopril.    Problem List: Patient Active Problem List   Diagnosis Date Noted  . Stress 08/06/2019  . Breast cancer screening 08/06/2019  . Pain due to onychomycosis of toenails of both feet 06/09/2019  . Prediabetes 12/20/2018  . Macular degeneration 06/20/2018  . GERD (gastroesophageal reflux disease) 06/20/2018  . Hypersomnia 06/20/2018  . Chronic pain of right knee 06/20/2018  . Bruising 12/14/2017  . Diffuse large B cell lymphoma ([place])  06/11/2017  . Diffuse large b-cell lymphoma, intra-abdominal lymph nodes ([place]) 06/11/2017  . Vulvar lesion 05/16/2017  . Low back pain 04/03/2017  . History of transient ischemic attack (TIA) 02/10/2017  . Exertional shortness of breath 11/06/2016  . Right hip pain 08/03/2016  . Essential hypertension 07/06/2016  . Left knee pain 06/28/2016  .  Hypomagnesemia 03/02/2016  . Morbid obesity due to excess calories ([place]) 03/02/2016  . Adjustment disorder with mixed anxiety and depressed mood 05/25/2015  . 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 Eczema 02/27/2013  . Muscle cramps 11/13/2012  . Personal history of lymphoma 02/20/2012  . Hypothyroidism 02/20/2012  . Hyperlipidemia 02/20/2012    Past Medical History: Past Medical History:  Diagnosis Date  . 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 Allergic rhinitis   . Arthritis   . CHF (congestive heart failure) ([place])   . Diastolic dysfunction    a. 11/2016 Echo: EF 60-65%, no rwma, mild LVH, Gr1 DD, triv MR, mildly dil LA, nl RV fxn.  . Diffuse large B cell lymphoma [facility]) Nov 2011   Dr Bailey Brooks, Dr. Madelynn Brooks s/p RCHOP and methotrexate, c/b renal failure  . Diverticulosis   . Esophagitis   . GERD (gastroesophageal reflux disease)   . H/O stem cell transplant ([place]) 06/2011   a. in setting of lymphoma.  . Hemorrhoids   . History of chemotherapy 22-Oct-2010   RHCOP/methotrexate-intrathecal  . History of stress test    a. 05/2010 Myoview: nl EF, no ischemia/infarct.  . Hypercholesterolemia   . Hypertension   . Hypothyroidism   . Morbid obesity ([place])   . Paget disease, extra mammary    vulva, s/p resection 2014, Dr. Sabra Heck  . Pagets disease, extra-mammary   . TIA (transient ischemic attack)     Past Surgical History: Past Surgical History:  Procedure Laterality Date  . ABDOMINAL HYSTERECTOMY  1985   Hysterectomy-partial  . BREAST BIOPSY  2002   Neg - AT [facility]  . BURR HOLE W/ PLACEMENT OMMAYA RESERVOIR    . BURR HOLE W/ PLACEMENT OMMAYA RESERVOIR  2012  . COLONOSCOPY  04/2013  . ESOPHAGOGASTRODUODENOSCOPY  04/2013  . INJECTION KNEE  07/02/2018  . INSERTION CENTRAL VENOUS ACCESS DEVICE W/ SUBCUTANEOUS PORT  2011   Port a Cath: Right chest Double Lumen, 04-Nov-2010  . LIMBAL  STEM CELL TRANSPLANT  2012  . LIVER BIOPSY     stage 4B large Bcell lymphoma  . ROTATOR CUFF REPAIR Right 2016  . SHOULDER ARTHROSCOPY Right 06/22/2015   Procedure: ARTHROSCOPY SHOULDER, parital repair of rotator cuff, biceps tenodesis, decompression and debridement;  Surgeon: Corky Mull, MD;  Location: [facility] ORS;  Service: Orthopedics;  Laterality: Right;  . TONSILLECTOMY  1954  . VULVECTOMY    . VULVECTOMY PARTIAL N/A 09/29/2015   Procedure: VULVECTOMY PARTIAL;  Surgeon: Gillis Ends, MD;  Location: [facility] ORS;  Service: Gynecology;  Laterality: N/A;    Past Gynecologic History:  S/p hysterectomy  OB History:  OB History  No obstetric history on file.    Family History: Family History  Problem Relation Age of Onset  . Diabetes Mother        died @ 70 of MI.  Marland Kitchen Hypertension Mother   . Heart attack Mother   . Melanoma Father 69       died of complications r/t melanoma w/ lung mets.  . Kidney disease Sister  Kidney removed   . Prostate cancer Brother        Prostate - dx in 85's  . Heart disease Brother 6       reported MI @ age 52, ? treated w/ TPA->no recurrent CAD, now in 28's.  Marland Kitchen Hearing loss Maternal Aunt   . Hearing loss Maternal Grandfather     Social History: Social History   Socioeconomic History  . Marital status: Married    Spouse name: Not on file  . Number of children: 2  . Years of education: Not on file  . Highest education level: Not on file  Occupational History  . Occupation: Software engineer Records, Sports administrator    Employer: [employer]  . Financial resource strain: Not on file  . Food insecurity    Worry: Not on file    Inability: Not on file  . Transportation needs    Medical: Not on file    Non-medical: Not on file  Tobacco Use  . Smoking status: Never Smoker  . Smokeless tobacco: Never Used  Substance and Sexual Activity  . Alcohol use: No    Alcohol/week: 0.0 standard drinks    Comment: former user-no  current use  . Drug use: No  . Sexual activity: Yes    Partners: Male    Birth control/protection: Post-menopausal  Lifestyle  . Physical activity    Days per week: Not on file    Minutes per session: Not on file  . Stress: Not on file  Relationships  . Social Herbalist on phone: Not on file    Gets together: Not on file    Attends religious service: Not on file    Active member of club or organization: Not on file    Attends meetings of clubs or organizations: Not on file    Relationship status: Not on file  . Intimate partner violence    Fear of current or ex partner: Not on file    Emotionally abused: Not on file    Physically abused: Not on file    Forced sexual activity: Not on file  Other Topics Concern  . Not on file  Social History Narrative   Lives in [place] with husband, has 2 grown sons.  No pets. 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   Right-handed   Caffeine: occasional caffeine free/diet soda or hot tea    Allergies: Allergies  Allergen Reactions  . Erythromycin Nausea And Vomiting  . Iodine Swelling    IV iodine (states now that this can be tolerated with Benadryl)  . Morphine And Related Nausea And Vomiting and Nausea Only    Hallucinations (also)  . Oxycodone Nausea And Vomiting  . Sulfa Antibiotics Other (See Comments)    Dizzy/Fainting  . Adhesive [Tape] Rash and Other (See Comments)    Including Bandaids    Current Medications: Current Outpatient Medications  Medication Sig Dispense Refill  . acetaminophen (TYLENOL) 650 MG CR tablet Take 650 mg by mouth every 8 (eight) hours as needed.     Marland Kitchen aspirin EC 81 MG tablet Take 81 mg by mouth daily.    Marland Kitchen atorvastatin (LIPITOR) 40 MG tablet TAKE 1 TABLET(40 MG) BY MOUTH DAILY 90 tablet 3  . chlorthalidone (HYGROTON) 25 MG tablet Take 0.5 tablets (12.5 mg total) by mouth daily. 45 tablet 1  . clobetasol cream (TEMOVATE) 0.05 %     . lansoprazole (PREVACID) 30 MG capsule TAKE 1 CAPSULE(30 MG) BY  MOUTH  DAILY 90 capsule 1  . levothyroxine (SYNTHROID) 88 MCG tablet take 1 tablet by mouth every morning ON AN EMPTY STOMACH 90 tablet 3  . lisinopril (ZESTRIL) 10 MG tablet Take 1 tablet (10 mg total) by mouth daily. 90 tablet 3  . Lutein 10 MG TABS Take 1 tablet by mouth daily.     . mometasone (ELOCON) 0.1 % cream APPLY TO AFFECTED AREA ON CHIN AND ELBOWS TWICE A DAY UNTIL CLEAR, THEN AS NEEDED FOR FLARES  0  . Multiple Vitamin (MULTIVITAMIN) tablet Take 1 tablet by mouth daily.     No current facility-administered medications for this visit.    Review of Systems General:  no complaints Skin: no complaints Eyes: no complaints HEENT: no complaints Breasts: no complaints Pulmonary: no complaints Cardiac: no complaints Gastrointestinal: no complaints Genitourinary/Sexual: no complaints Ob/Gyn: no complaints Musculoskeletal: right hip pain Hematology: no complaints Neurologic/Psych: no complaints   Objective:  Physical Examination:  BP (!) 158/85 (BP Location: Left Arm, Patient Position: Sitting)   Pulse 68   Temp 97.9 F (36.6 C) (Tympanic)   Ht 5\' 3"  (1.6 m)   Wt 244 lb 11.2 oz (111 kg)   BMI 43.35 kg/m    Body mass index is 43.35 kg/m.  ECOG Performance Status: 0 - Asymptomatic   GENERAL: Patient is a well appearing female in no acute distress HEENT:  PERRL, neck supple with midline trachea.  EXTREMITIES:  No peripheral edema.   NEURO:  Nonfocal. Well oriented.  Appropriate affect.  Pelvic: Exam Chaperoned by RN Vulva: vulvar scars well healed after prior resections. The biopsy sites are healing well and no evidence of infection. The sharp demarcation between mucosa/vulvar tissue and epithelial tissue on the right is unchanged. Remainder of exam deferred from last visit - Vagina: normal. Bimanual: no masses. Cervix/Uterus: surgically absent   Procedure Note:  Informed Consent obtained. Time out performed. The area was cleansed with Betadine, patient stated she does not  have an allergy to topical Betadine. Biopsy performed at 3 and 7 o'clock after anesthesia obtained with lidocaine gel and local 5 cc lidocaine. Hemostasis excellent with AgNO3. Patient reassessed after procedure and in stable condition. No complications.   Assessment:  Bailey Brooks is a 68 y.o. female diagnosed with recurrent Paget's disease of the vulva, s/p WLE with positive margins and subsequent recurrence 07/2015. Repeat WLE consistent with recurrent Paget's disease of the vulva s/p WLE with negative margins. Biopsy in 2018 negative.Recurrent Paget's on the right.     History of recurrent vulvar candidiasis, resolved after 6 month course of diflucan. Possible recurrent candidiasis on exam but asymptomatic.    Plan:   Problem List Items Addressed This Visit      Musculoskeletal and Integument   Paget disease, extra mammary - Primary (Chronic)      We reviewed several options including "skinning [facility]ctomy" at [facility] as she has already had a vulvectomy on this side, with Plastic surgery for skin graft; versus Moh's surgery with Dr. Cindee Salt, Aldara therapy, or continued close surveillance. She is very concerned with invasive procedures at this time and would like to wait until proceeding with surgery. She would like to continue close surveillance and plan to RTC  in 3 months with repeat biopsies. I would like to biopsy the 7 o'clock site again as well as the area of sharp demarcation to ensure no Paget's disease in that area.   She has scheduled her mammograms and colonoscopy. She will follow up  with her orthopedic surgeon to assess her right hip pain.   Patient does request female providers only.    I personally interviewed and examined the patient. Agreed with the above/below plan of care. Patient/family questions were answered. Beckey Rutter, NP scribed the note.   Greater 25 minutes were spent with the patient/family today; >50% was spent in education, counseling and  coordination of care for Paget's.   [place] Gaetana Michaelis, MD

## 2019-09-03 NOTE — Patient Instructions (Signed)
Follow up with Dr. Theora Gianotti in 3 months. Treatment options include:  -Vulvar surgery with Dr. Theora Gianotti -Moh's surgery at [place] [place] with Dr. Onalee Hua -Imiquimod cream    -Imiquimod skin cream What is this medicine? IMIQUIMOD (i mi KWI mod) cream is used to treat external genital or anal warts. It is also used to treat other skin conditions such as actinic keratosis and certain types of skin cancer. This medicine may be used for other purposes; ask your health care provider or pharmacist if you have questions. COMMON BRAND NAME(S): Tawni Levy What should I tell my health care provider before I take this medicine? They need to know if you have any of these conditions:  decreased immune function  an unusual or allergic reaction to imiquimod, other medicines, foods, dyes, or preservatives  pregnant or trying to get pregnant  breast-feeding How should I use this medicine? This medicine is for external use only. Do not take by mouth. Follow the directions on the prescription label. Apply just before bedtime. Wash your hands before and after use. Apply a thin layer of cream and massage gently into the affected areas until no longer visible. Do not use in the mouth, eyes or the vagina. Use this medicine only on the affected area as directed by your health care provider. Do not use for longer than prescribed. It is important not to use more medicine than prescribed. To do so may increase the chance of side effects. Talk to your pediatrician regarding the use of this medicine in children. While this drug may be prescribed for children as young as 66 years of age for selected conditions, precautions do apply. Overdosage: If you think you have taken too much of this medicine contact a poison control center or emergency room at once. NOTE: This medicine is only for you. Do not share this medicine with others. What if I miss a dose? If you miss a dose, use it as soon as you can. If it is almost time  for your next dose, use only that dose. Do not use double or extra doses. What may interact with this medicine? Interactions are not expected. Do not use any other medicines on the treated area without asking your doctor or health care professional. This list may not describe all possible interactions. Give your health care provider a list of all the medicines, herbs, non-prescription drugs, or dietary supplements you use. Also tell them if you smoke, drink alcohol, or use illegal drugs. Some items may interact with your medicine. What should I watch for while using this medicine? Visit your health care professional for regular checks on your progress. Do not use this medicine until the skin has healed from any other drug (example: podofilox or podophyllin resin) or surgical skin treatment. Females should receive regular pelvic exams while being treated for genital warts. Most patients see improvement within 4 weeks. It may take up to 16 weeks to see a full clearing of the warts. This medicine is not a cure. New warts may develop during or after treatment. Avoid sexual (genital, anal, oral) contact while the cream is on the skin. If warts are visible in the genital area, sexual contact should be avoided until the warts are treated. The use of latex condoms during sexual contact may reduce, but not entirely prevent, infecting others. This medicine may weaken condoms, diaphragms, cervical caps or other barrier devices and make them less effective as birth control. Do not cover the treated area with an airtight  bandage. Cotton gauze dressings can be used. Cotton underwear can be worn after using this medicine on the genital or anal area. Actinic keratoses that were not seen before may appear during treatment and may later go away. The treatment area and surrounding area may lighten or darken after treatment with this medicine. These skin color changes may be permanent in some patients. If you experience a skin  reaction at the treatment site that interferes or prevents you from doing any daily activity, contact your health care provider. You may need a rest period from treatment. Treatment may be restarted once the reaction has gotten better as recommended by your doctor or health care professional. This medicine can make you more sensitive to the sun. Keep out of the sun. If you cannot avoid being in the sun, wear protective clothing and use sunscreen. Do not use sun lamps or tanning beds/booths. What side effects may I notice from receiving this medicine? Side effects that you should report to your doctor or health care professional as soon as possible:  open sores with or without drainage  skin infection  skin rash  unusual or severe skin reaction Side effects that usually do not require medical attention (report to your doctor or health care professional if they continue or are bothersome):  burning or itching  redness of the skin (very common but is usually not painful or harmful)  scabbing, crusting, or peeling skin  skin that becomes hard or thickened  swelling of the skin This list may not describe all possible side effects. Call your doctor for medical advice about side effects. You may report side effects to FDA at 1-800-FDA-1088. Where should I keep my medicine? Keep out of the reach of children. Store between 4 and 25 degrees C (39 and 77 degrees F). Do not freeze. Throw away any unused medicine after the expiration date. Discard packet after applying to affected area. Partial packets should not be saved or reused. NOTE: This sheet is a summary. It may not cover all possible information. If you have questions about this medicine, talk to your doctor, pharmacist, or health care provider.  2020 Elsevier/Gold Standard (2008-11-03 10:33:25)

## 2019-09-10 DIAGNOSIS — N6311 Unspecified lump in the right breast, upper outer quadrant: Secondary | ICD-10-CM | POA: Diagnosis not present

## 2019-09-10 DIAGNOSIS — Z1231 Encounter for screening mammogram for malignant neoplasm of breast: Secondary | ICD-10-CM | POA: Diagnosis not present

## 2019-09-10 DIAGNOSIS — Z1239 Encounter for other screening for malignant neoplasm of breast: Secondary | ICD-10-CM | POA: Diagnosis not present

## 2019-09-11 NOTE — Progress Notes (Signed)
Mailed article sent regarding Aldara.

## 2019-09-17 ENCOUNTER — Telehealth: Payer: Self-pay

## 2019-09-17 ENCOUNTER — Telehealth: Payer: Self-pay | Admitting: Family Medicine

## 2019-09-17 DIAGNOSIS — D0511 Intraductal carcinoma in situ of right breast: Secondary | ICD-10-CM | POA: Diagnosis not present

## 2019-09-17 DIAGNOSIS — Z170 Estrogen receptor positive status [ER+]: Secondary | ICD-10-CM | POA: Diagnosis not present

## 2019-09-17 DIAGNOSIS — N6311 Unspecified lump in the right breast, upper outer quadrant: Secondary | ICD-10-CM | POA: Diagnosis not present

## 2019-09-17 DIAGNOSIS — C50411 Malignant neoplasm of upper-outer quadrant of right female breast: Secondary | ICD-10-CM | POA: Diagnosis not present

## 2019-09-17 DIAGNOSIS — N6312 Unspecified lump in the right breast, upper inner quadrant: Secondary | ICD-10-CM | POA: Diagnosis not present

## 2019-09-17 DIAGNOSIS — C50911 Malignant neoplasm of unspecified site of right female breast: Secondary | ICD-10-CM

## 2019-09-17 HISTORY — DX: Malignant neoplasm of unspecified site of right female breast: C50.911

## 2019-09-17 LAB — HM MAMMOGRAPHY

## 2019-09-17 NOTE — Telephone Encounter (Signed)
Please contact Dr. Geroge Baseman at [facility].  CB#:  [phone]

## 2019-09-17 NOTE — Telephone Encounter (Signed)
See other phone note. Spoke with Dr Geroge Baseman regarding breast mass.

## 2019-09-17 NOTE — Telephone Encounter (Signed)
Copied from [place] [id number]. Topic: General - Other >> Sep 17, 2019 10:33 AM Keene Breath wrote: Reason for CRM: Called to speak with the nurse or doctor regarding the patient's mammogram.  Please call back to discuss.  CB# [phone]

## 2019-09-17 NOTE — Telephone Encounter (Signed)
Received call from Dr Nicholaus Bloom at [facility]. Patient had a mammogram that revealed a mass. They biopsied this and the results should be back by Monday. He wanted to update me and keep me in the loop. I thanked him for the call and we will await the results.

## 2019-09-22 ENCOUNTER — Telehealth: Payer: Self-pay | Admitting: Family Medicine

## 2019-09-22 ENCOUNTER — Telehealth: Payer: Self-pay

## 2019-09-22 DIAGNOSIS — D0591 Unspecified type of carcinoma in situ of right breast: Secondary | ICD-10-CM

## 2019-09-22 DIAGNOSIS — C50911 Malignant neoplasm of unspecified site of right female breast: Secondary | ICD-10-CM

## 2019-09-22 NOTE — Telephone Encounter (Signed)
I called and spoke with the patient.  She notes she has been notified by [facility] about her breast cancer diagnosis.  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  Her biomarkers are pending still.  She is requesting a referral to Benjamine Sprague at [facility].  This referral has been placed.  I have sent a message to our referral coordinator to call them on Tuesday to try to set up an appointment.  I will also forward this message to the patient's oncologist to determine if he would like to go ahead and see her or wait until after her general surgery evaluation.  The patient did ask if this could have been caught earlier had she gone through with her mammogram earlier this year when it was delayed related to the COVID-19 pandemic.  I discussed that it would be difficult to say that it absolutely would have been caught earlier.

## 2019-09-22 NOTE — Telephone Encounter (Signed)
Copied from [place] [id number]. Topic: Referral - Request for Referral >> Sep 22, 2019 11:38 AM Erick Blinks wrote: Has patient seen PCP for this complaint? Yes.   *If NO, is insurance requiring patient see PCP for this issue before PCP can refer them? Referral for which specialty: Surgery Preferred provider/office: Dr. Benjamine Sprague Reason for referral: Malignancy   Best contact: [place] from [facility] called to report a malignancy from recent imaging.

## 2019-09-23 NOTE — Telephone Encounter (Signed)
Thank you for letting me know and getting her set up with an appointment.

## 2019-09-23 NOTE — Telephone Encounter (Signed)
Copied from [place] [id number]. Topic: Referral - Request for Referral >> Sep 22, 2019 11:38 AM Erick Blinks wrote: Has patient seen PCP for this complaint? Yes.   *If NO, is insurance requiring patient see PCP for this issue before PCP can refer them? Referral for which specialty: Surgery Preferred provider/office: Dr. Benjamine Sprague Reason for referral: Malignancy   Best contact: [place] from [facility] called to report a malignancy from recent imaging.

## 2019-09-23 NOTE — Telephone Encounter (Signed)
Dr. Caryl Bis, Dr. B is [place], but I have spoken to Al Pimple, our Breast Cancer Nurse Navigator. Webb Silversmith will contact the patient with an apt with one of the oncologist and collaborate her care.

## 2019-09-23 NOTE — Telephone Encounter (Signed)
I have already spoken to the patient and placed the referral.

## 2019-09-24 ENCOUNTER — Encounter: Payer: Self-pay | Admitting: Obstetrics and Gynecology

## 2019-09-25 ENCOUNTER — Other Ambulatory Visit: Admission: RE | Admit: 2019-09-25 | Payer: Medicare Other | Source: Ambulatory Visit

## 2019-09-26 NOTE — Telephone Encounter (Signed)
Called patient to discuss. She hasn't yet decided to go with Aldara but if she does, she will call the [facility] for Symptom Management appointment with Imogean Ciampa to review patient teaching, application instructions, etc. I'm not aware of any interactions for possible surgery or radiation. Dr. Fransisca Connors wasn't either. I also encouraged her to discuss with the physicians who will be treating her breast cancer.

## 2019-09-29 ENCOUNTER — Ambulatory Visit
Admission: RE | Admit: 2019-09-29 | Payer: BC Managed Care – PPO | Source: Home / Self Care | Admitting: Internal Medicine

## 2019-09-29 ENCOUNTER — Encounter: Admission: RE | Payer: Self-pay | Source: Home / Self Care

## 2019-09-29 SURGERY — COLONOSCOPY WITH PROPOFOL
Anesthesia: General

## 2019-10-01 DIAGNOSIS — C50411 Malignant neoplasm of upper-outer quadrant of right female breast: Secondary | ICD-10-CM | POA: Diagnosis not present

## 2019-10-03 ENCOUNTER — Inpatient Hospital Stay
Admission: RE | Admit: 2019-10-03 | Discharge: 2019-10-03 | Disposition: A | Discharge status: Home / Self Care | Payer: Self-pay | Source: Ambulatory Visit | Attending: Family Medicine | Admitting: Family Medicine

## 2019-10-03 ENCOUNTER — Other Ambulatory Visit: Payer: Self-pay | Admitting: Family Medicine

## 2019-10-03 DIAGNOSIS — Z1231 Encounter for screening mammogram for malignant neoplasm of breast: Secondary | ICD-10-CM

## 2019-10-05 DIAGNOSIS — C50919 Malignant neoplasm of unspecified site of unspecified female breast: Secondary | ICD-10-CM

## 2019-10-05 HISTORY — DX: Malignant neoplasm of unspecified site of unspecified female breast: C50.919

## 2019-10-07 ENCOUNTER — Telehealth: Payer: Self-pay | Admitting: Internal Medicine

## 2019-10-07 NOTE — Telephone Encounter (Signed)
Colette, pls schedule f/u with Dr. B in 2 weeks

## 2019-10-07 NOTE — Telephone Encounter (Signed)
I spoke to pt; re: results of the breast biopsy.  Spoke to Dr. Lysle Pearl.  Agree with the surgical plan.  Anne/sheena-please have the patient follow-up with me in 2 weeks or so after surgery.   FYI- Dr.Sonnenberg.

## 2019-10-08 ENCOUNTER — Other Ambulatory Visit: Payer: Self-pay | Admitting: Surgery

## 2019-10-08 ENCOUNTER — Encounter: Payer: Self-pay | Admitting: Internal Medicine

## 2019-10-08 ENCOUNTER — Encounter: Payer: Self-pay | Admitting: Nurse Practitioner

## 2019-10-08 ENCOUNTER — Ambulatory Visit: Payer: Self-pay | Admitting: Surgery

## 2019-10-08 ENCOUNTER — Other Ambulatory Visit: Payer: Self-pay

## 2019-10-08 DIAGNOSIS — C50411 Malignant neoplasm of upper-outer quadrant of right female breast: Secondary | ICD-10-CM

## 2019-10-08 NOTE — H&P (View-Only) (Signed)
Subjective:   CC: Malignant neoplasm of upper-outer quadrant of right female breast, unspecified estrogen receptor status (CMS-HCC) [C50.411] HPI:  Bailey Brooks is a 68 y.o. female who was referred by [facility] for evaluation of above. Change was noted on last screening mammogram. Patient does routinely do self breast exams.  Patient has not noted a change on breast exam. Age of menarche was 14. Age of menopause was 81s.  Last menstrual period was 68y/o s/p hysterectomy. Patient denies hormonal therapy. Patient is G2P2. Patient did breast feed. Patient denies nipple discharge. Patient with hx of breat biopsy years ago. Patient denies a personal history of breast cancer.   Past Medical History:  has a past medical history of Colon polyp (2014), Duodenitis (05/21/2014), Esophagitis, Hiatal hernia (05/21/2014), History of chemotherapy, adenomatous colonic polyps (01/22/2019), and Lymphoma (CMS-HCC).  Past Surgical History:  has a past surgical history that includes Colonoscopy (12/22/2008); Upper gastrointestinal endoscopy (2014); stem cell transplant; Tonsillectomy; Abdominal hysterectomy; omayan resevior; Colonoscopy (03/18/2013); and Breast biopsy.  Family History: family history includes Breast cancer in her sister; Gallbladder disease in her brother and mother; Melanoma in her father; Prostate cancer in her brother.  Social History:  reports that she has never smoked. She has never used smokeless tobacco. She reports that she does not drink alcohol or use drugs.  Current Medications: has a current medication list which includes the following prescription(s): acetaminophen, aspirin, atorvastatin, chlorthalidone, clobetasol, lansoprazole, levothyroxine, lisinopril, lutein, and multivitamin.  Allergies:       Allergies as of 10/01/2019 - Reviewed 10/01/2019  Allergen Reaction Noted  . Adhesive Other (See Comments) 08/02/2015  . Erythromycin Unknown   . Iodine Swelling  06/02/2015  . Morphine Nausea and Hallucination 05/21/2014  . Sulfa (sulfonamide antibiotics) Unknown     ROS:  A 15 point review of systems was performed and was negative except as noted in HPI   Objective:   BP 158/78   Pulse 68   Ht 160 cm (5\' 3" )   Wt (!) 109.8 kg (242 lb)   BMI 42.87 kg/m   Constitutional :  alert, appears stated age, cooperative and no distress  Lymphatics/Throat:  no asymmetry, masses, or scars  Respiratory:  clear to auscultation bilaterally  Cardiovascular:  regular rate and rhythm  Gastrointestinal: soft, non-tender; bowel sounds normal; no masses,  no organomegaly.   Musculoskeletal: Steady gait and movement  Skin: Cool and moist,   Psychiatric: Normal affect, non-agitated, not confused  Breast:  Chaperone present for exam.  breasts appear normal, no suspicious masses, no skin or nipple changes or axillary nodes.    LABS:  n/a   RADS: EXAM: MAMMO DIAGNOSTIC BREAST WITH TOMOSYNTHESIS BILATERAL 09/17/2019 9:00 AM 09/17/2019 10:30 AM MAMMO US BREAST LIMITED RIGHT 09/17/2019 9:00 AM 09/17/2019 10:30 AM  INDICATION: mass and arch dist. mass  COMPARISON: Compared to: 09/10/2019 Mammo screening breast tomosynthesis bilateral, 10/05/2017 Mammo screening digital bilateral, and 03/25/2016 Mammo screening digital bilateral  TECHNIQUE: Tomosynthesis images were obtained as part of this exam.  FINDINGS: The breasts have scattered areas of fibroglandular density.  Left Architectural Distortion Mammo diagnostic breast tomosynthesis bilateral: Previously described finding does not persist. Apparent architectural distortion in the left breast resolves with spot compression tomosynthesis views, consistent with superimposed tissue.   There are no suspicious findings in this breast.  Right Mass Mammo diagnostic breast tomosynthesis bilateral: The previously questioned distortion in the right breast has the appearance of an irregular  mass on the additional views. This irregularly shaped mass  has indistinct margins and is located in the upper outer quadrant of the right breast, middle to posterior depth. Posterior to this irregular mass, there is an oval circumscribed mass measuring 15 x 13 mm.   Mammo US breast limited right: There is a 15 mm x 10 mm x 12 mm irregular mass with indistinct margins with no posterior features seen in the right breast at 10 o'clock. The mass correlates with the irregular mass noted on mammography. Benign simple cysts are also seen adjacent to the irregular hypoechoic solid mass. The benign simple cysts correlate with the 15 x 13 mm circumscribed mass noted on mammography.   IMPRESSION: Right Right breast 15 mm x 10 x 12 mm irregular solid mass at the 10 o'clock position is suspicious for malignancy. Assessment: 4 - Suspicious finding. Ultrasound-guided biopsy is recommended. This will be performed today.  Left No mammographic evidence of malignancy in left breast.  RECOMMENDATION: Biopsy - [place] is recommended for the right breast.This will be performed today 09/17/2019.  The exam was electronically reviewed by a staff physician.  The findings and recommendations have been discussed with the patient. The ordering physician Dr. [name] was notified via telephone at 3:05 PM on 09/17/2019.  BI-RADS: 4 - Suspicious  Review of the biopsy results in conjunction with the imaging findings demonstrated:  A. Right breast, 10:00, ultrasound-guided needle core biopsy:  Invasive adenocarcinoma of the breast, see comment. Histologic type: Ductal, with tubular features Nottingham combined histologic grade: 1 of 3  Tubule formation score: 1  Nuclear pleomorphism score: 1  Mitotic rate score: 1  In-situ carcinoma: Present Type of in-situ carcinoma: Ductal, cribriform Nuclear grade of in-situ carcinoma: 1 of 3  Breast cancer biomarker studies: Pending Paraffin block  number: A1 Results will be issued in a separate report from the Image Cytometry Laboratory.   The patient was notified by phone on 09/19/2019. The patient has requested referral to Dr. Benjamine Sprague at the [facility] in [place], [place] for surgical consultation. Patient [place], Merlyn Albert at [facility] was contacted by phone on 09/22/2019 by Elspeth Cho to request the referral. An Epic Inbox message sent directly to Dr. Benjamine Sprague on 09/23/2019. Surgical consultation is recommended.  Addended by Gillermo Murdoch, MD on 09/23/2019 7:39 AM     Assessment:   Malignant neoplasm of upper-outer quadrant of right female breast, unspecified estrogen receptor status (CMS-HCC) [C50.411]  LN negative on US  Obesity Paget's disease  Plan:   1. Malignant neoplasm of upper-outer quadrant of right female breast, unspecified estrogen receptor status (CMS-HCC) [C50.411]  Discussed the risk of surgery including recurrence, chronic pain, post-op infxn, poor/delayed wound healing, poor cosmesis, seroma, hematoma formation, and possible re-operation to address said risks. The risks of general anesthetic, if used, includes MI, CVA, sudden death or even reaction to anesthetic medications also discussed.  Typical post-op recovery time and possbility of activity restrictions were also discussed.  Alternatives include continued observation.  Benefits include possible symptom relief, pathologic evaluation, and/or curative excision.   The patient verbalized understanding and all questions were answered to the patient's satisfaction.  2. Likely proceed with right partial mastectomy with SLNB.  Patient has elected to proceed with surgical treatment. Procedure will be scheduled.  Written consent was obtained.  Obesity will increase perioperative risks.  Recommended continue to lose weight if possible.  Paget's disease treatment should not  affect our surgery      Electronically signed by Lysle Pearl,  Torrin Crihfield, DO on 10/02/2019 10:37 AM

## 2019-10-08 NOTE — H&P (Signed)
Subjective:   CC: Malignant neoplasm of upper-outer quadrant of right female breast, unspecified estrogen receptor status (CMS-HCC) [C50.411] HPI:  Bailey Brooks is a 68 y.o. female who was referred by [facility] for evaluation of above. Change was noted on last screening mammogram. Patient does routinely do self breast exams.  Patient has not noted a change on breast exam. Age of menarche was 68. Age of menopause was 30s.  Last menstrual period was 68y/o s/p hysterectomy. Patient denies hormonal therapy. Patient is G2P2. Patient did breast feed. Patient denies nipple discharge. Patient with hx of breat biopsy years ago. Patient denies a personal history of breast cancer.   Past Medical History:  has a past medical history of Colon polyp (2014), Duodenitis (05/21/2014), Esophagitis, Hiatal hernia (05/21/2014), History of chemotherapy, adenomatous colonic polyps (01/22/2019), and Lymphoma (CMS-HCC).  Past Surgical History:  has a past surgical history that includes Colonoscopy (12/22/2008); Upper gastrointestinal endoscopy (2014); stem cell transplant; Tonsillectomy; Abdominal hysterectomy; omayan resevior; Colonoscopy (03/18/2013); and Breast biopsy.  Family History: family history includes Breast cancer in her sister; Gallbladder disease in her brother and mother; Melanoma in her father; Prostate cancer in her brother.  Social History:  reports that she has never smoked. She has never used smokeless tobacco. She reports that she does not drink alcohol or use drugs.  Current Medications: has a current medication list which includes the following prescription(s): acetaminophen, aspirin, atorvastatin, chlorthalidone, clobetasol, lansoprazole, levothyroxine, lisinopril, lutein, and multivitamin.  Allergies:       Allergies as of 10/01/2019 - Reviewed 10/01/2019  Allergen Reaction Noted  . Adhesive Other (See Comments) 08/02/2015  . Erythromycin Unknown   . Iodine Swelling  06/02/2015  . Morphine Nausea and Hallucination 05/21/2014  . Sulfa (sulfonamide antibiotics) Unknown     ROS:  A 15 point review of systems was performed and was negative except as noted in HPI   Objective:   BP 158/78   Pulse 68   Ht 160 cm (5\' 3" )   Wt (!) 109.8 kg (242 lb)   BMI 42.87 kg/m   Constitutional :  alert, appears stated age, cooperative and no distress  Lymphatics/Throat:  no asymmetry, masses, or scars  Respiratory:  clear to auscultation bilaterally  Cardiovascular:  regular rate and rhythm  Gastrointestinal: soft, non-tender; bowel sounds normal; no masses,  no organomegaly.   Musculoskeletal: Steady gait and movement  Skin: Cool and moist,   Psychiatric: Normal affect, non-agitated, not confused  Breast:  Chaperone present for exam.  breasts appear normal, no suspicious masses, no skin or nipple changes or axillary nodes.    LABS:  n/a   RADS: EXAM: MAMMO DIAGNOSTIC BREAST WITH TOMOSYNTHESIS BILATERAL 09/17/2019 9:00 AM 09/17/2019 10:30 AM MAMMO US BREAST LIMITED RIGHT 09/17/2019 9:00 AM 09/17/2019 10:30 AM  INDICATION: mass and arch dist. mass  COMPARISON: Compared to: 09/10/2019 Mammo screening breast tomosynthesis bilateral, 10/05/2017 Mammo screening digital bilateral, and 03/25/2016 Mammo screening digital bilateral  TECHNIQUE: Tomosynthesis images were obtained as part of this exam.  FINDINGS: The breasts have scattered areas of fibroglandular density.  Left Architectural Distortion Mammo diagnostic breast tomosynthesis bilateral: Previously described finding does not persist. Apparent architectural distortion in the left breast resolves with spot compression tomosynthesis views, consistent with superimposed tissue.   There are no suspicious findings in this breast.  Right Mass Mammo diagnostic breast tomosynthesis bilateral: The previously questioned distortion in the right breast has the appearance of an irregular  mass on the additional views. This irregularly shaped mass  has indistinct margins and is located in the upper outer quadrant of the right breast, middle to posterior depth. Posterior to this irregular mass, there is an oval circumscribed mass measuring 15 x 13 mm.   Mammo US breast limited right: There is a 15 mm x 10 mm x 12 mm irregular mass with indistinct margins with no posterior features seen in the right breast at 10 o'clock. The mass correlates with the irregular mass noted on mammography. Benign simple cysts are also seen adjacent to the irregular hypoechoic solid mass. The benign simple cysts correlate with the 15 x 13 mm circumscribed mass noted on mammography.   IMPRESSION: Right Right breast 15 mm x 10 x 12 mm irregular solid mass at the 10 o'clock position is suspicious for malignancy. Assessment: 4 - Suspicious finding. Ultrasound-guided biopsy is recommended. This will be performed today.  Left No mammographic evidence of malignancy in left breast.  RECOMMENDATION: Biopsy - [place] is recommended for the right breast.This will be performed today 09/17/2019.  The exam was electronically reviewed by a staff physician.  The findings and recommendations have been discussed with the patient. The ordering physician Dr. [name] was notified via telephone at 3:05 PM on 09/17/2019.  BI-RADS: 4 - Suspicious  Review of the biopsy results in conjunction with the imaging findings demonstrated:  A. Right breast, 10:00, ultrasound-guided needle core biopsy:  Invasive adenocarcinoma of the breast, see comment. Histologic type: Ductal, with tubular features Nottingham combined histologic grade: 1 of 3  Tubule formation score: 1  Nuclear pleomorphism score: 1  Mitotic rate score: 1  In-situ carcinoma: Present Type of in-situ carcinoma: Ductal, cribriform Nuclear grade of in-situ carcinoma: 1 of 3  Breast cancer biomarker studies: Pending Paraffin block  number: A1 Results will be issued in a separate report from the Image Cytometry Laboratory.   The patient was notified by phone on 09/19/2019. The patient has requested referral to Dr. Benjamine Sprague at the [facility] in [place], [place] for surgical consultation. Patient [place], Merlyn Albert at [facility] was contacted by phone on 09/22/2019 by Elspeth Cho to request the referral. An Epic Inbox message sent directly to Dr. Benjamine Sprague on 09/23/2019. Surgical consultation is recommended.  Addended by Gillermo Murdoch, MD on 09/23/2019 7:39 AM     Assessment:   Malignant neoplasm of upper-outer quadrant of right female breast, unspecified estrogen receptor status (CMS-HCC) [C50.411]  LN negative on US  Obesity Paget's disease  Plan:   1. Malignant neoplasm of upper-outer quadrant of right female breast, unspecified estrogen receptor status (CMS-HCC) [C50.411]  Discussed the risk of surgery including recurrence, chronic pain, post-op infxn, poor/delayed wound healing, poor cosmesis, seroma, hematoma formation, and possible re-operation to address said risks. The risks of general anesthetic, if used, includes MI, CVA, sudden death or even reaction to anesthetic medications also discussed.  Typical post-op recovery time and possbility of activity restrictions were also discussed.  Alternatives include continued observation.  Benefits include possible symptom relief, pathologic evaluation, and/or curative excision.   The patient verbalized understanding and all questions were answered to the patient's satisfaction.  2. Likely proceed with right partial mastectomy with SLNB.  Patient has elected to proceed with surgical treatment. Procedure will be scheduled.  Written consent was obtained.  Obesity will increase perioperative risks.  Recommended continue to lose weight if possible.  Paget's disease treatment should not  affect our surgery      Electronically signed by Lysle Pearl,  Keatyn Luck, DO on 10/02/2019 10:37 AM

## 2019-10-09 NOTE — Progress Notes (Signed)
Patient referred to Oncology by Dr. Caryl Bis after having a breast biopsy at [facility] .  She is known to Dr. Rogue Bussing, and Dr. Theora Gianotti.  Scheduled he with Dr. Lysle Pearl for surgical consult.  Partial mastectomy scheduled 10/16/2019, and she will follow-up with Dr. Rogue Bussing on 11/04/2019.  Confirmed appointments with patient.

## 2019-10-10 ENCOUNTER — Other Ambulatory Visit: Payer: Self-pay

## 2019-10-10 DIAGNOSIS — C50919 Malignant neoplasm of unspecified site of unspecified female breast: Secondary | ICD-10-CM

## 2019-10-10 LAB — SLIDE CONSULT, PATHOLOGY ARMC

## 2019-10-13 ENCOUNTER — Other Ambulatory Visit: Payer: Self-pay

## 2019-10-13 ENCOUNTER — Inpatient Hospital Stay (HOSPITAL_BASED_OUTPATIENT_CLINIC_OR_DEPARTMENT_OTHER): Payer: BC Managed Care – PPO | Admitting: Nurse Practitioner

## 2019-10-13 ENCOUNTER — Inpatient Hospital Stay: Payer: BC Managed Care – PPO | Attending: Obstetrics and Gynecology

## 2019-10-13 ENCOUNTER — Encounter: Payer: Self-pay | Admitting: Nurse Practitioner

## 2019-10-13 ENCOUNTER — Other Ambulatory Visit
Admission: RE | Admit: 2019-10-13 | Discharge: 2019-10-13 | Disposition: A | Discharge status: Home / Self Care | Payer: BC Managed Care – PPO | Source: Ambulatory Visit | Attending: Surgery | Admitting: Surgery

## 2019-10-13 VITALS — BP 159/81 | HR 97 | Temp 98.1°F | Resp 18 | Wt 244.4 lb

## 2019-10-13 DIAGNOSIS — Z7951 Long term (current) use of inhaled steroids: Secondary | ICD-10-CM | POA: Diagnosis not present

## 2019-10-13 DIAGNOSIS — Z20828 Contact with and (suspected) exposure to other viral communicable diseases: Secondary | ICD-10-CM | POA: Insufficient documentation

## 2019-10-13 DIAGNOSIS — I498 Other specified cardiac arrhythmias: Secondary | ICD-10-CM | POA: Insufficient documentation

## 2019-10-13 DIAGNOSIS — C8338 Diffuse large B-cell lymphoma, lymph nodes of multiple sites: Secondary | ICD-10-CM | POA: Insufficient documentation

## 2019-10-13 DIAGNOSIS — Z7982 Long term (current) use of aspirin: Secondary | ICD-10-CM | POA: Diagnosis not present

## 2019-10-13 DIAGNOSIS — I509 Heart failure, unspecified: Secondary | ICD-10-CM | POA: Insufficient documentation

## 2019-10-13 DIAGNOSIS — K2100 Gastro-esophageal reflux disease with esophagitis, without bleeding: Secondary | ICD-10-CM | POA: Insufficient documentation

## 2019-10-13 DIAGNOSIS — C4499 Other specified malignant neoplasm of skin, unspecified: Secondary | ICD-10-CM

## 2019-10-13 DIAGNOSIS — R600 Localized edema: Secondary | ICD-10-CM | POA: Diagnosis not present

## 2019-10-13 DIAGNOSIS — Z9484 Stem cells transplant status: Secondary | ICD-10-CM | POA: Diagnosis not present

## 2019-10-13 DIAGNOSIS — Z9221 Personal history of antineoplastic chemotherapy: Secondary | ICD-10-CM | POA: Diagnosis not present

## 2019-10-13 DIAGNOSIS — I10 Essential (primary) hypertension: Secondary | ICD-10-CM | POA: Insufficient documentation

## 2019-10-13 DIAGNOSIS — E039 Hypothyroidism, unspecified: Secondary | ICD-10-CM | POA: Insufficient documentation

## 2019-10-13 DIAGNOSIS — Z01818 Encounter for other preprocedural examination: Secondary | ICD-10-CM | POA: Insufficient documentation

## 2019-10-13 DIAGNOSIS — I110 Hypertensive heart disease with heart failure: Secondary | ICD-10-CM | POA: Diagnosis not present

## 2019-10-13 DIAGNOSIS — C50411 Malignant neoplasm of upper-outer quadrant of right female breast: Secondary | ICD-10-CM | POA: Insufficient documentation

## 2019-10-13 DIAGNOSIS — Z170 Estrogen receptor positive status [ER+]: Secondary | ICD-10-CM | POA: Diagnosis not present

## 2019-10-13 DIAGNOSIS — Z8673 Personal history of transient ischemic attack (TIA), and cerebral infarction without residual deficits: Secondary | ICD-10-CM | POA: Diagnosis not present

## 2019-10-13 DIAGNOSIS — E7800 Pure hypercholesterolemia, unspecified: Secondary | ICD-10-CM | POA: Diagnosis not present

## 2019-10-13 DIAGNOSIS — M889 Osteitis deformans of unspecified bone: Secondary | ICD-10-CM | POA: Insufficient documentation

## 2019-10-13 DIAGNOSIS — C519 Malignant neoplasm of vulva, unspecified: Secondary | ICD-10-CM | POA: Diagnosis not present

## 2019-10-13 DIAGNOSIS — Z888 Allergy status to other drugs, medicaments and biological substances status: Secondary | ICD-10-CM | POA: Insufficient documentation

## 2019-10-13 DIAGNOSIS — M1990 Unspecified osteoarthritis, unspecified site: Secondary | ICD-10-CM | POA: Diagnosis not present

## 2019-10-13 DIAGNOSIS — Z79899 Other long term (current) drug therapy: Secondary | ICD-10-CM | POA: Diagnosis not present

## 2019-10-13 DIAGNOSIS — T461X5A Adverse effect of calcium-channel blockers, initial encounter: Secondary | ICD-10-CM | POA: Insufficient documentation

## 2019-10-13 HISTORY — DX: Presence of cerebrospinal fluid drainage device: Z98.2

## 2019-10-13 HISTORY — DX: Anemia, unspecified: D64.9

## 2019-10-13 LAB — CBC
HCT: 40.3 % (ref 36.0–46.0)
Hemoglobin: 13.4 g/dL (ref 12.0–15.0)
MCH: 28.6 pg (ref 26.0–34.0)
MCHC: 33.3 g/dL (ref 30.0–36.0)
MCV: 85.9 fL (ref 80.0–100.0)
Platelets: 225 10*3/uL (ref 150–400)
RBC: 4.69 MIL/uL (ref 3.87–5.11)
RDW: 14.2 % (ref 11.5–15.5)
WBC: 8 10*3/uL (ref 4.0–10.5)
nRBC: 0 % (ref 0.0–0.2)

## 2019-10-13 LAB — BASIC METABOLIC PANEL
Anion gap: 9 (ref 5–15)
BUN: 20 mg/dL (ref 8–23)
CO2: 28 mmol/L (ref 22–32)
Calcium: 9.6 mg/dL (ref 8.9–10.3)
Chloride: 101 mmol/L (ref 98–111)
Creatinine, Ser: 0.86 mg/dL (ref 0.44–1.00)
GFR calc Af Amer: >60 mL/min (ref >60)
GFR calc non Af Amer: >60 mL/min (ref >60)
Glucose, Bld: 126 mg/dL — ABNORMAL HIGH (ref 70–99)
Potassium: 3.3 mmol/L — ABNORMAL LOW (ref 3.5–5.1)
Sodium: 138 mmol/L (ref 135–145)

## 2019-10-13 LAB — SARS CORONAVIRUS 2 (TAT 6-24 HRS): SARS Coronavirus 2: NEGATIVE

## 2019-10-13 MED ORDER — IMIQUIMOD 5 % EX CREA: TOPICAL_CREAM | CUTANEOUS | 1 refills | Status: DC

## 2019-10-13 NOTE — Patient Instructions (Addendum)
Apply the Aldara cream by applying thin layer topically to vulva at bedtime 3 nights a week at bedtime.  Leave on skin overnight then removed with soap and water in the morning.  Leave on skin for 6-10 hours.   To avoid the cream from getting on nontreatment sites, you can apply Desitin or Vaseline to nontreated skin surfaces to avoid contact. We recommend handwashing before and after cream application.  Avoid using excessive amounts of cream.  Aldara 5% cream was packaged and single-use packets which contain sufficient cream to cover the area.  Apply the cream externally only.  Apply a thin layer of cream and rub into the affected area until it is no longer visible.  The application site should not be covered. Local skin reactions including redness, swelling, discomfort, at the treatment site are common.  A rest period of several days may be taken if needed to allow the skin to rest.  Treatment may resume once the reaction subsides.    Imiquimod skin cream What is this medicine? IMIQUIMOD (i mi KWI mod) cream is used to treat external genital or anal warts. It is also used to treat other skin conditions such as actinic keratosis and certain types of skin cancer. This medicine may be used for other purposes; ask your health care provider or pharmacist if you have questions. COMMON BRAND NAME(S): Tawni Levy What should I tell my health care provider before I take this medicine? They need to know if you have any of these conditions:  decreased immune function  an unusual or allergic reaction to imiquimod, other medicines, foods, dyes, or preservatives  pregnant or trying to get pregnant  breast-feeding How should I use this medicine? This medicine is for external use only. Do not take by mouth. Follow the directions on the prescription label. Apply just before bedtime. Wash your hands before and after use. Apply a thin layer of cream and massage gently into the affected areas until no longer  visible. Do not use in the mouth, eyes or the vagina. Use this medicine only on the affected area as directed by your health care provider. Do not use for longer than prescribed. It is important not to use more medicine than prescribed. To do so may increase the chance of side effects. Talk to your pediatrician regarding the use of this medicine in children. While this drug may be prescribed for children as young as 69 years of age for selected conditions, precautions do apply. Overdosage: If you think you have taken too much of this medicine contact a poison control center or emergency room at once. NOTE: This medicine is only for you. Do not share this medicine with others. What if I miss a dose? If you miss a dose, use it as soon as you can. If it is almost time for your next dose, use only that dose. Do not use double or extra doses. What may interact with this medicine? Interactions are not expected. Do not use any other medicines on the treated area without asking your doctor or health care professional. This list may not describe all possible interactions. Give your health care provider a list of all the medicines, herbs, non-prescription drugs, or dietary supplements you use. Also tell them if you smoke, drink alcohol, or use illegal drugs. Some items may interact with your medicine. What should I watch for while using this medicine? Visit your health care professional for regular checks on your progress. Do not use this medicine until  the skin has healed from any other drug (example: podofilox or podophyllin resin) or surgical skin treatment. Females should receive regular pelvic exams while being treated for genital warts. Most patients see improvement within 4 weeks. It may take up to 16 weeks to see a full clearing of the warts. This medicine is not a cure. New warts may develop during or after treatment. Avoid sexual (genital, anal, oral) contact while the cream is on the skin. If warts are  visible in the genital area, sexual contact should be avoided until the warts are treated. The use of latex condoms during sexual contact may reduce, but not entirely prevent, infecting others. This medicine may weaken condoms, diaphragms, cervical caps or other barrier devices and make them less effective as birth control. Do not cover the treated area with an airtight bandage. Cotton gauze dressings can be used. Cotton underwear can be worn after using this medicine on the genital or anal area. Actinic keratoses that were not seen before may appear during treatment and may later go away. The treatment area and surrounding area may lighten or darken after treatment with this medicine. These skin color changes may be permanent in some patients. If you experience a skin reaction at the treatment site that interferes or prevents you from doing any daily activity, contact your health care provider. You may need a rest period from treatment. Treatment may be restarted once the reaction has gotten better as recommended by your doctor or health care professional. This medicine can make you more sensitive to the sun. Keep out of the sun. If you cannot avoid being in the sun, wear protective clothing and use sunscreen. Do not use sun lamps or tanning beds/booths. What side effects may I notice from receiving this medicine? Side effects that you should report to your doctor or health care professional as soon as possible:  open sores with or without drainage  skin infection  skin rash  unusual or severe skin reaction Side effects that usually do not require medical attention (report to your doctor or health care professional if they continue or are bothersome):  burning or itching  redness of the skin (very common but is usually not painful or harmful)  scabbing, crusting, or peeling skin  skin that becomes hard or thickened  swelling of the skin This list may not describe all possible side effects.  Call your doctor for medical advice about side effects. You may report side effects to FDA at 1-800-FDA-1088. Where should I keep my medicine? Keep out of the reach of children. Store between 4 and 25 degrees C (39 and 77 degrees F). Do not freeze. Throw away any unused medicine after the expiration date. Discard packet after applying to affected area. Partial packets should not be saved or reused. NOTE: This sheet is a summary. It may not cover all possible information. If you have questions about this medicine, talk to your doctor, pharmacist, or health care provider.  2020 Elsevier/Gold Standard (2008-11-03 10:33:25)

## 2019-10-13 NOTE — Patient Instructions (Signed)
Your procedure is scheduled on: 10-16-19 THURSDAY Report to [place] AT 10:15 AM  Remember: Instructions that are not followed completely may result in serious medical risk, up to and including death, or upon the discretion of your surgeon and anesthesiologist your surgery may need to be rescheduled.    _x___ 1. Do not eat food after midnight the night before your procedure. NO GUM OR CANDY AFTER MIDNIGHT. You may drink clear liquids up to 2 hours before you are scheduled to arrive at the hospital for your procedure.  Do not drink clear liquids within 2 hours of your scheduled arrival to the hospital.  Clear liquids include  --Water or Apple juice without pulp  -- Gatorade  --Black Coffee or Clear Tea (No milk, no creamers, do not add anything to the coffee or Tea   ____Ensure clear carbohydrate drink on the way to the hospital for bariatric patients  ____Ensure clear carbohydrate drink 3 hours before surgery.     __x__ 2. No Alcohol for 24 hours before or after surgery.   __x__3. No Smoking or e-cigarettes for 24 prior to surgery.  Do not use any chewable tobacco products for at least 6 hour prior to surgery   ____  4. Bring all medications with you on the day of surgery if instructed.    __x__ 5. Notify your doctor if there is any change in your medical condition     (cold, fever, infections).    x___6. On the morning of surgery brush your teeth with toothpaste and water.  You may rinse your mouth with mouth wash if you wish.  Do not swallow any toothpaste or mouthwash.   Do not wear jewelry, make-up, hairpins, clips or nail polish.  Do not wear lotions, powders, or perfumes.   Do not shave 48 hours prior to surgery. Men may shave face and neck.  Do not bring valuables to the hospital.    [facility] is not responsible for any belongings or valuables.               Contacts, dentures or bridgework may not be worn into surgery.  Leave your suitcase in the car.  After surgery it may be brought to your room.  For patients admitted to the hospital, discharge time is determined by your treatment team.  _  Patients discharged the day of surgery will not be allowed to drive home.  You will need someone to drive you home and stay with you the night of your procedure.    Please read over the following fact sheets that you were given:   [facility] Preparing for Surgery and or MRSA Information   _x___ TAKE THE FOLLOWING MEDICATION THE MORNING OF SURGERY WITH A SMALL SIP OF WATER. These include:  1. LIPITOR (ATORVASTATIN)  2. SYNTHROID (LEVOTHYROXINE)  3. PREVACID (LANSOPRAZOLE)  4. TAKE AN EXTRA PREVACID THE NIGHT BEFORE YOUR SURGERY  5.  6.  ____Fleets enema or Magnesium Citrate as directed.   _x___ Use CHG Soap or sage wipes as directed on instruction sheet   ____ Use inhalers on the day of surgery and bring to hospital day of surgery  ____ Stop Metformin and Janumet 2 days prior to surgery.    ____ Take 1/2 of usual insulin dose the night before surgery and none on the morning surgery.   _x___ Follow recommendations from Cardiologist, Pulmonologist or PCP regarding stopping Aspirin, Coumadin, Plavix ,Eliquis, Effient, or Pradaxa, and Pletal-STOPPED ASPIRIN ON Saturday 10-12-19  X____Stop Anti-inflammatories such as Advil, Aleve, Ibuprofen, Motrin, Naproxen, Naprosyn, Goodies powders or aspirin products NOW- OK to take Tylenol    _x___ Stop supplements until after surgery-STOP LUTEIN/ZEAXANTHIN SUPPLEMENT NOW-MAY RESUME AFTER SURGERY   ____ Bring C-Pap to the hospital.

## 2019-10-13 NOTE — Progress Notes (Signed)
Gynecologic Oncology Interval Visit   Referring Provider: Dr. Kenton Brooks  Chief Concern: Recurrent paget's disease of the vulva  Subjective:  Bailey Brooks is a 68 y.o. female, diagnosed with Paget's of the vulva, s/p WLE and vulvar biopsy 09/29/2015, who returns to clinic today for discussion of starting imiquimod.  Previously, biopsies were obtained with findings consistent with recurrent Paget's.  DIAGNOSIS:  A. VULVA, LEFT AT 3:00; PUNCH BIOPSY:  - BENIGN VULVAR TISSUE.  - NEGATIVE FOR DYSPLASIA AND MALIGNANCY.  DIAGNOSIS:  A. VULVA, RIGHT AT 7:00; PUNCH BIOPSY:  - EXTRAMAMMARY PAGET'S DISEASE OF THE VULVA.  - NEGATIVE FOR DYSPLASIA AND MALIGNANCY.   She was seen by Dr. Theora Brooks and myself in late September to discuss management options.  She was felt not to be a candidate for Mohs surgery and expressed her wishes to avoid surgery.  We discussed topical Aldara.  In the interim, she has been diagnosed with right breast cancer and plans to undergo lumpectomy on November 12.  She has established care with medical oncology.  Concurrent use of topical imiquimod during her treatment for breast cancer was okayed by medical oncology and surgery.  Overall she feels well today.  She goes for preadmission testing later today.  Says she is feeling a bit overwhelmed and anxious with recent breast cancer diagnosis and concerned about also starting imiquimod at the same time.  Denies vulvar itching or discomfort.  Denies discharge.    Gynecologic Oncology History: Mrs. Bailey Brooks has a history of localized vulvar Paget's disease.   04/2013- vulvar biopsy revealed Paget's disease               WLE, additional margins resected for positive disease on frozen evaluation.   Part A: VULVA, VAGINAL MARGIN:  - POSITIVE FOR EXTRAMAMMARY PAGET'S DISEASE  Part B: VULVA, POSTERIOR MARGIN:  - POSITIVE FOR EXTRAMAMMARY PAGET'S DISEASE  Part C: VULVA, RIGHT, PARTIAL VULVECTOMY:  - EXTRAMAMMARY PAGET'S  DISEASE  Part D: VULVA, NEW VAGINAL MARGIN:  - POSITIVE FOR EXTRAMAMMARY PAGET'S DISEASE  07/28/2015 for routine surveillance an area on the right vulva seemed more suspicious for recurrence. This was biopsied and confirmed recurrent Paget's disease.   She underwent repeat excision 09/29/15 A. VULVA; EXCISION:  - RARE CYTOKERATIN 7 POSITIVE INTRAEPIDERMAL CELLS, INTERPRETED AS RESIDUAL PAGET DISEASE.  - THE SURGICAL MARGINS ARE CLEAR.   B. VULVA, 11:00; BIOPSY:  - RARE CYTOKERATIN 7 POSITIVE INTRAEPIDERMAL CELLS, INTERPRETED AS PAGET DISEASE.   No additional issues since surgery.   She had vulvar biopsy on 05/09/17 which was negative:   DIAGNOSIS:  A. VULVA; BIOPSY:  - SKIN WITH FOCAL KERATOSIS AND HYPERGRANULOSIS - NEGATIVE FOR DYSPLASIA AND MALIGNANCY  Of note, she has had a h/o recurrent vulvar candidiasis. Prior to her vulvar surgery 09/2015 she received 5 weekly doses of oral diflucan for vulvar candidiasis. Postop she was treated for yeast infection again in 12/17 and then took one Diflucan a week for six months with good results.   Seen in gyn-onc clinic on 01/16/18 by Dr. Theora Brooks. NED at that time.   She has swelling of her lower extremities which was evaluated by her PCP and thought to be related to amlodipine. She started lasix which has improved her symptoms. She was seen by Dr. 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   She saw Dr. Theora Brooks on 01/29/2019 with symptoms concerning for candidiasis at that time but no evidence of recurrent disease.  Her blood pressure medications were recently changes due to swelling secondary to amlodipine. No on lisinopril.    Problem List: Patient Active Problem List   Diagnosis Date Noted  . Stress 08/06/2019  . Breast cancer screening 08/06/2019  . Pain due to onychomycosis of toenails of both feet 06/09/2019  . Prediabetes 12/20/2018  . Macular degeneration 06/20/2018  . GERD  (gastroesophageal reflux disease) 06/20/2018  . Hypersomnia 06/20/2018  . Chronic pain of right knee 06/20/2018  . Bruising 12/14/2017  . 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 Diffuse large b-cell lymphoma, intra-abdominal lymph nodes ([place]) 06/11/2017  . Vulvar lesion 05/16/2017  . Low back pain 04/03/2017  . History of transient ischemic attack (TIA) 02/10/2017  . Exertional shortness of breath 11/06/2016  . Right hip pain 08/03/2016  . Essential hypertension 07/06/2016  . Left knee pain 06/28/2016  . Hypomagnesemia 03/02/2016  . Morbid obesity due to excess calories ([place]) 03/02/2016  . Adjustment disorder with mixed anxiety and depressed mood 05/25/2015  . 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 Eczema 02/27/2013  . Muscle cramps 11/13/2012  . Personal history of lymphoma 02/20/2012  . Hypothyroidism 02/20/2012  . Hyperlipidemia 02/20/2012    Past Medical History: Past Medical History:  Diagnosis Date  . 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 Allergic rhinitis   . Arthritis   . CHF (congestive heart failure) ([place])   . Diastolic dysfunction    a. 11/2016 Echo: EF 60-65%, no rwma, mild LVH, Gr1 DD, triv MR, mildly dil LA, nl RV fxn.  . Diffuse large B cell lymphoma [facility]) Nov 2011   Dr Bailey Brooks, Dr. Madelynn Brooks s/p RCHOP and methotrexate, c/b renal failure  . Diverticulosis   . Esophagitis   . GERD (gastroesophageal reflux disease)   . H/O stem cell transplant ([place]) 06/2011   a. in setting of lymphoma.  . Hemorrhoids   . History of chemotherapy 22-Oct-2010   RHCOP/methotrexate-intrathecal  . History of stress test    a. 05/2010 Myoview: nl EF, no ischemia/infarct.  . Hypercholesterolemia   . Hypertension   . Hypothyroidism   . Morbid obesity ([place])   . Paget disease, extra mammary    vulva, s/p resection 2014, Dr. Sabra Brooks  . Pagets disease, extra-mammary   . TIA  (transient ischemic attack)     Past Surgical History: Past Surgical History:  Procedure Laterality Date  . ABDOMINAL HYSTERECTOMY  1985   Hysterectomy-partial  . BREAST BIOPSY  2002   Neg - AT [facility]  . BURR HOLE W/ PLACEMENT OMMAYA RESERVOIR    . BURR HOLE W/ PLACEMENT OMMAYA RESERVOIR  2012  . COLONOSCOPY  04/2013  . ESOPHAGOGASTRODUODENOSCOPY  04/2013  . INJECTION KNEE  07/02/2018  . INSERTION CENTRAL VENOUS ACCESS DEVICE W/ SUBCUTANEOUS PORT  2011   Port a Cath: Right chest Double Lumen, 04-Nov-2010  . LIMBAL STEM CELL TRANSPLANT  2012  . LIVER BIOPSY     stage 4B large Bcell lymphoma  . ROTATOR CUFF REPAIR Right 2016  . SHOULDER ARTHROSCOPY Right 06/22/2015   Procedure: ARTHROSCOPY SHOULDER, parital repair of rotator cuff, biceps tenodesis, decompression and debridement;  Surgeon: Corky Mull, MD;  Location: [facility] ORS;  Service: Orthopedics;  Laterality: Right;  . TONSILLECTOMY  1954  . VULVECTOMY    . VULVECTOMY PARTIAL N/A 09/29/2015   Procedure: VULVECTOMY PARTIAL;  Surgeon: Gillis Ends, MD;  Location: [facility] ORS;  Service: Gynecology;  Laterality: N/A;    Past Gynecologic History:  S/p hysterectomy  OB History:  OB History  No obstetric history on file.    Family History: Family History  Problem Relation Age of Onset  . Diabetes Mother        died @ 66 of MI.  Marland Kitchen Hypertension Mother   . Heart attack Mother   . Melanoma Father 24       died of complications r/t melanoma w/ lung mets.  . Kidney disease Sister        Kidney removed   . Prostate cancer Brother        Prostate - dx in 59's  . Heart disease Brother 17       reported MI @ age 25, ? treated w/ TPA->no recurrent CAD, now in 59's.  Marland Kitchen Hearing loss Maternal Aunt   . Hearing loss Maternal Grandfather     Social History: Social History   Socioeconomic History  . Marital status: Married    Spouse name: Not on file  . Number of children: 2  . Years of education: Not on file  . Highest  education level: Not on file  Occupational History  . Occupation: Software engineer Records, Sports administrator    Employer: [employer]  . Financial resource strain: Not on file  . Food insecurity    Worry: Not on file    Inability: Not on file  . Transportation needs    Medical: Not on file    Non-medical: Not on file  Tobacco Use  . Smoking status: Never Smoker  . Smokeless tobacco: Never Used  Substance and Sexual Activity  . Alcohol use: No    Alcohol/week: 0.0 standard drinks    Comment: former user-no current use  . Drug use: No  . Sexual activity: Yes    Partners: Male    Birth control/protection: Post-menopausal  Lifestyle  . Physical activity    Days per week: Not on file    Minutes per session: Not on file  . Stress: Not on file  Relationships  . Social Herbalist on phone: Not on file    Gets together: Not on file    Attends religious service: Not on file    Active member of club or organization: Not on file    Attends meetings of clubs or organizations: Not on file    Relationship status: Not on file  . Intimate partner violence    Fear of current or ex partner: Not on file    Emotionally abused: Not on file    Physically abused: Not on file    Forced sexual activity: Not on file  Other Topics Concern  . Not on file  Social History Narrative   Lives in [place] with husband, has 2 grown sons.  No pets. 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   Right-handed   Caffeine: occasional caffeine free/diet soda or hot tea    Allergies: Allergies  Allergen Reactions  . Erythromycin Nausea And Vomiting  . Iodine Swelling    IV iodine (states now that this can be tolerated with Benadryl)  . Morphine And Related Nausea And Vomiting and Nausea Only    Hallucinations  . Oxycodone Nausea And Vomiting  . Sulfa Antibiotics Other (See Comments)    Dizzy/Fainting  . Adhesive [Tape] Rash and Other (See Comments)    Including Bandaids    Current  Medications: Current Outpatient Medications  Medication Sig  Dispense Refill  . acetaminophen (TYLENOL) 500 MG tablet Take 1,000 mg by mouth every 6 (six) hours as needed for moderate pain.    Marland Kitchen aspirin EC 81 MG tablet Take 81 mg by mouth daily.    Marland Kitchen atorvastatin (LIPITOR) 40 MG tablet TAKE 1 TABLET(40 MG) BY MOUTH DAILY (Patient taking differently: Take 40 mg by mouth daily. ) 90 tablet 3  . chlorthalidone (HYGROTON) 25 MG tablet Take 0.5 tablets (12.5 mg total) by mouth daily. 45 tablet 1  . cholecalciferol (VITAMIN D3) 25 MCG (1000 UT) tablet Take 1,000 Units by mouth daily.    . clobetasol cream (TEMOVATE) [id number] % Apply 1 application topically daily as needed (psoriasis).     . lansoprazole (PREVACID) 30 MG capsule TAKE 1 CAPSULE(30 MG) BY MOUTH DAILY (Patient taking differently: Take 30 mg by mouth daily. ) 90 capsule 1  . levothyroxine (SYNTHROID) 88 MCG tablet take 1 tablet by mouth every morning ON AN EMPTY STOMACH (Patient taking differently: Take 88 mcg by mouth daily before breakfast. ) 90 tablet 3  . lisinopril (ZESTRIL) 10 MG tablet Take 1 tablet (10 mg total) by mouth daily. 90 tablet 3  . Lutein 10 MG TABS Take 10 mg by mouth daily.     . mometasone (ELOCON) 0.1 % cream Apply 1 application topically daily as needed (eczema).   0  . Multiple Vitamin (MULTIVITAMIN) tablet Take 1 tablet by mouth daily.     No current facility-administered medications for this visit.    Review of Systems General:  no complaints Skin: no complaints Eyes: no complaints HEENT: no complaints Breasts: New breast cancer diagnosis per HPI Pulmonary: no complaints Cardiac: no complaints Gastrointestinal: no complaints Genitourinary/Sexual: no complaints Ob/Gyn: Recurrent Paget's per HPI Musculoskeletal: no complaints Hematology: no complaints Neurologic/Psych: anxious/overwhelmed  Objective:  Physical Examination:  BP (!) 159/81 (BP Location: Left Arm, Patient Position: Sitting)   Pulse 97    Temp 98.1 F (36.7 C) (Tympanic)   Resp 18   Wt 244 lb 6.4 oz (110.9 kg)   SpO2 97%   BMI 43.29 kg/m    Body mass index is 43.29 kg/m.  ECOG Performance Status: 0 - Asymptomatic   GENERAL: Patient is a well appearing female in no acute distress HEENT:  Sclera clear. Anicteric EXTREMITIES:  No peripheral edema. Atraumatic. No cyanosis SKIN:  Clear with no obvious rashes or skin changes.  NEURO:  Nonfocal. Well oriented.  Appropriate affect.  Pelvic: Exam Chaperoned by RN Vulva: Vulvar scars well-healed after prior resections.  Biopsy sites have healed well with no evidence of infection.  Minimal scarring.  At 7:00 to 9:00, sharp demarcation between mucosa/vulvar tissue and epithelial tissue on the right.  Speculum and bimanual exam deferred.   Assessment:  Bailey Brooks is a 68 y.o. female diagnosed with recurrent Paget's disease of the vulva status post wide local excision with positive margins and subsequent recurrence in 07/2015.  Repeat wide local excision consistent with recurrent Paget's disease of the vulva status post wide local excision with negative margins.  Biopsy in 2018 was negative.  Biopsy in 2020 at 7:00 consistent with recurrent Paget's.  She presents today for evaluation and initiation of imiquimod topical treatment.  History of recurrent vulvar candidiasis-resolved after 56-month course of Diflucan.  No evidence of recurrent candidiasis on exam and asymptomatic today.  Right breast cancer-newly diagnosed with right breast cancer status post biopsy.  Plans for right lumpectomy on 10/16/2019 with Dr. Lysle Pearl.  She is followed by  medical oncology/Dr. Rogue Brooks for possible adjuvant treatment.      Plan:   Problem List Items Addressed This Visit      Musculoskeletal and Integument   Extramammary Paget disease - Primary     We again reviewed options for management and alternatives.  Patient has elected to proceed with topical imiquimod.  Today we discussed  application.  Dr. Theora Brooks has recommended topical application and thin layer 3 nights a week at bedtime, leave on the skin for 6-10 hours and remove following morning by washing the treated area with mild soap and water.  We discussed that local skin reactions/erythema at the treatment site are common.  If this occurs, she should take a rest period of several days to reduce discomfort and severity of skin reaction.  Treatment may resume once the reaction subsides.  She may wear nonocclusive dressing such as cotton gauze or underwear.  Advised not to use occlusive dressings.  Extensive teaching and application were conducted today.  Patient advised to wash hands before and after cream application.  Light thin layer and avoid use of excessive amounts.  Apply to external area only.  To avoid the cream getting on nontreatment sites was recommended that she apply Desitin or Vaseline to the nontreated skin surfaces.  If intolerant to treatment or refractory symptoms could consider vulvectomy with plastic surgery for skin graft versus Mohs surgery versus continued close surveillance.  She is previously expressed her desires to avoid invasive procedures if possible.   Due to diagnosis of breast cancer after screening mammogram she is currently deferred screening colonoscopy.  Encouraged her to have this performed in the future.  Patient does request female providers only.  We will plan to have her return to clinic in 4 weeks for reevaluation and assessment of tolerance.  She will notify the clinic once she has received and started medication so that this appointment may be scheduled.  A total of (25) minutes of face-to-face time was spent with this patient with greater than 50% of that time in counseling and care-coordination.  Beckey Rutter, DNP, AGNP-C [place] [facility] [phone] (clinic)  CC: Dr. Theora Brooks

## 2019-10-14 ENCOUNTER — Encounter: Payer: Self-pay | Admitting: Nurse Practitioner

## 2019-10-14 NOTE — Progress Notes (Signed)
Prior authorization submitted to CoverMyMeds per patient's insurance plan.

## 2019-10-15 ENCOUNTER — Telehealth: Payer: Self-pay | Admitting: Family Medicine

## 2019-10-15 NOTE — Telephone Encounter (Signed)
I faxed the results to both providers Dr. Lysle Pearl and Dr. Rogue Bussing Dr. Randie Heinz office  confirmed that they received the fax.  I am awaiting a return call from Dr. Aletha Halim office, I left a voicemail.   Nina,cma

## 2019-10-15 NOTE — Telephone Encounter (Signed)
Received breast biopsy cytometry results. Please fax to Dr Benjamine Sprague at [facility] surgery and Dr Charlaine Dalton at [facility] cancer center. Please also call to confirm that they received the fax. Thanks.

## 2019-10-16 ENCOUNTER — Encounter: Payer: Self-pay | Admitting: Licensed Clinical Social Worker

## 2019-10-16 ENCOUNTER — Ambulatory Visit: Payer: BC Managed Care – PPO | Admitting: Anesthesiology

## 2019-10-16 ENCOUNTER — Encounter
Admission: RE | Disposition: A | Discharge status: Home / Self Care | Payer: Self-pay | Source: Home / Self Care | Attending: Surgery

## 2019-10-16 ENCOUNTER — Other Ambulatory Visit: Payer: Self-pay

## 2019-10-16 ENCOUNTER — Ambulatory Visit
Admission: RE | Admit: 2019-10-16 | Discharge: 2019-10-16 | Disposition: A | Discharge status: Home / Self Care | Payer: BC Managed Care – PPO | Source: Ambulatory Visit | Attending: Surgery | Admitting: Surgery

## 2019-10-16 ENCOUNTER — Encounter
Admission: RE | Admit: 2019-10-16 | Discharge: 2019-10-16 | Disposition: A | Discharge status: Home / Self Care | Payer: BC Managed Care – PPO | Source: Ambulatory Visit | Attending: Surgery | Admitting: Surgery

## 2019-10-16 ENCOUNTER — Ambulatory Visit
Admission: RE | Admit: 2019-10-16 | Discharge: 2019-10-16 | Disposition: A | Discharge status: Home / Self Care | Payer: BC Managed Care – PPO | Attending: Surgery | Admitting: Surgery

## 2019-10-16 DIAGNOSIS — K2100 Gastro-esophageal reflux disease with esophagitis, without bleeding: Secondary | ICD-10-CM | POA: Diagnosis not present

## 2019-10-16 DIAGNOSIS — Z79899 Other long term (current) drug therapy: Secondary | ICD-10-CM | POA: Diagnosis not present

## 2019-10-16 DIAGNOSIS — Z885 Allergy status to narcotic agent status: Secondary | ICD-10-CM | POA: Diagnosis not present

## 2019-10-16 DIAGNOSIS — Z881 Allergy status to other antibiotic agents status: Secondary | ICD-10-CM | POA: Insufficient documentation

## 2019-10-16 DIAGNOSIS — Z882 Allergy status to sulfonamides status: Secondary | ICD-10-CM | POA: Diagnosis not present

## 2019-10-16 DIAGNOSIS — F4323 Adjustment disorder with mixed anxiety and depressed mood: Secondary | ICD-10-CM | POA: Insufficient documentation

## 2019-10-16 DIAGNOSIS — Z803 Family history of malignant neoplasm of breast: Secondary | ICD-10-CM | POA: Diagnosis not present

## 2019-10-16 DIAGNOSIS — Z79890 Hormone replacement therapy (postmenopausal): Secondary | ICD-10-CM | POA: Insufficient documentation

## 2019-10-16 DIAGNOSIS — E039 Hypothyroidism, unspecified: Secondary | ICD-10-CM | POA: Diagnosis not present

## 2019-10-16 DIAGNOSIS — C50411 Malignant neoplasm of upper-outer quadrant of right female breast: Secondary | ICD-10-CM

## 2019-10-16 DIAGNOSIS — M1990 Unspecified osteoarthritis, unspecified site: Secondary | ICD-10-CM | POA: Diagnosis not present

## 2019-10-16 DIAGNOSIS — Z6841 Body Mass Index (BMI) 40.0 and over, adult: Secondary | ICD-10-CM | POA: Diagnosis not present

## 2019-10-16 DIAGNOSIS — C4499 Other specified malignant neoplasm of skin, unspecified: Secondary | ICD-10-CM | POA: Insufficient documentation

## 2019-10-16 DIAGNOSIS — Z9221 Personal history of antineoplastic chemotherapy: Secondary | ICD-10-CM | POA: Insufficient documentation

## 2019-10-16 DIAGNOSIS — I10 Essential (primary) hypertension: Secondary | ICD-10-CM | POA: Insufficient documentation

## 2019-10-16 DIAGNOSIS — E785 Hyperlipidemia, unspecified: Secondary | ICD-10-CM | POA: Diagnosis not present

## 2019-10-16 DIAGNOSIS — E7800 Pure hypercholesterolemia, unspecified: Secondary | ICD-10-CM | POA: Diagnosis not present

## 2019-10-16 DIAGNOSIS — Z888 Allergy status to other drugs, medicaments and biological substances status: Secondary | ICD-10-CM | POA: Diagnosis not present

## 2019-10-16 DIAGNOSIS — Z8673 Personal history of transient ischemic attack (TIA), and cerebral infarction without residual deficits: Secondary | ICD-10-CM | POA: Diagnosis not present

## 2019-10-16 DIAGNOSIS — Z170 Estrogen receptor positive status [ER+]: Secondary | ICD-10-CM | POA: Diagnosis not present

## 2019-10-16 DIAGNOSIS — C50911 Malignant neoplasm of unspecified site of right female breast: Secondary | ICD-10-CM | POA: Diagnosis not present

## 2019-10-16 DIAGNOSIS — Z7982 Long term (current) use of aspirin: Secondary | ICD-10-CM | POA: Insufficient documentation

## 2019-10-16 HISTORY — PX: BREAST LUMPECTOMY: SHX2

## 2019-10-16 HISTORY — PX: PARTIAL MASTECTOMY WITH NEEDLE LOCALIZATION AND AXILLARY SENTINEL LYMPH NODE BX: SHX6009

## 2019-10-16 LAB — POCT I-STAT, CHEM 8
BUN: 20 mg/dL (ref 8–23)
Calcium, Ion: 0.79 mmol/L — CL (ref 1.15–1.40)
Chloride: 108 mmol/L (ref 98–111)
Creatinine, Ser: 0.8 mg/dL (ref 0.44–1.00)
Glucose, Bld: 109 mg/dL — ABNORMAL HIGH (ref 70–99)
HCT: 41 % (ref 36.0–46.0)
Hemoglobin: 13.9 g/dL (ref 12.0–15.0)
Potassium: 4.2 mmol/L (ref 3.5–5.1)
Sodium: 134 mmol/L — ABNORMAL LOW (ref 135–145)
TCO2: 24 mmol/L (ref 22–32)

## 2019-10-16 SURGERY — PARTIAL MASTECTOMY WITH NEEDLE LOCALIZATION AND AXILLARY SENTINEL LYMPH NODE BX
Anesthesia: General | Site: Breast | Laterality: Right

## 2019-10-16 MED ORDER — ACETAMINOPHEN 500 MG PO TABS
ORAL_TABLET | ORAL | Status: AC
  Administered 2019-10-16: 1000 mg
  Filled 2019-10-16: qty 2

## 2019-10-16 MED ORDER — EPINEPHRINE PF 1 MG/ML IJ SOLN
INTRAMUSCULAR | Status: AC
  Filled 2019-10-16: qty 1

## 2019-10-16 MED ORDER — TRAMADOL HCL 50 MG PO TABS: 50.0000 mg | ORAL_TABLET | Freq: Four times a day (QID) | ORAL | 0 refills | Status: DC | PRN

## 2019-10-16 MED ORDER — CEFAZOLIN SODIUM-DEXTROSE 2-4 GM/100ML-% IV SOLN
INTRAVENOUS | Status: AC
  Filled 2019-10-16: qty 100

## 2019-10-16 MED ORDER — LIDOCAINE HCL (CARDIAC) PF 100 MG/5ML IV SOSY
PREFILLED_SYRINGE | INTRAVENOUS | Status: DC | PRN
  Administered 2019-10-16: 100 mg via INTRAVENOUS

## 2019-10-16 MED ORDER — ACETAMINOPHEN 325 MG PO TABS: 650.0000 mg | ORAL_TABLET | Freq: Three times a day (TID) | ORAL | 0 refills | Status: AC | PRN

## 2019-10-16 MED ORDER — MEPERIDINE HCL 50 MG/ML IJ SOLN: 6.2500 mg | INTRAMUSCULAR | Status: DC | PRN

## 2019-10-16 MED ORDER — PROPOFOL 10 MG/ML IV BOLUS
INTRAVENOUS | Status: DC | PRN
  Administered 2019-10-16: 200 mg via INTRAVENOUS

## 2019-10-16 MED ORDER — FENTANYL CITRATE (PF) 100 MCG/2ML IJ SOLN: 25.0000 ug | INTRAMUSCULAR | Status: DC | PRN

## 2019-10-16 MED ORDER — FENTANYL CITRATE (PF) 100 MCG/2ML IJ SOLN
INTRAMUSCULAR | Status: DC | PRN
  Administered 2019-10-16: 50 ug via INTRAVENOUS
  Administered 2019-10-16 (×2): 25 ug via INTRAVENOUS

## 2019-10-16 MED ORDER — ONDANSETRON HCL 4 MG/2ML IJ SOLN
INTRAMUSCULAR | Status: DC | PRN
  Administered 2019-10-16: 4 mg via INTRAVENOUS

## 2019-10-16 MED ORDER — GLYCOPYRROLATE 0.2 MG/ML IJ SOLN
INTRAMUSCULAR | Status: DC | PRN
  Administered 2019-10-16: 0.2 mg via INTRAVENOUS

## 2019-10-16 MED ORDER — MIDAZOLAM HCL 2 MG/2ML IJ SOLN
INTRAMUSCULAR | Status: AC
  Filled 2019-10-16: qty 2

## 2019-10-16 MED ORDER — FENTANYL CITRATE (PF) 100 MCG/2ML IJ SOLN
INTRAMUSCULAR | Status: AC
  Filled 2019-10-16: qty 2

## 2019-10-16 MED ORDER — LIDOCAINE HCL (PF) 2 % IJ SOLN
INTRAMUSCULAR | Status: AC
  Filled 2019-10-16: qty 10

## 2019-10-16 MED ORDER — PROMETHAZINE HCL 25 MG/ML IJ SOLN: 6.2500 mg | INTRAMUSCULAR | Status: DC | PRN

## 2019-10-16 MED ORDER — LACTATED RINGERS IV SOLN
INTRAVENOUS | Status: DC
  Administered 2019-10-16: 12:00:00 via INTRAVENOUS

## 2019-10-16 MED ORDER — LIDOCAINE HCL (PF) 1 % IJ SOLN
INTRAMUSCULAR | Status: DC | PRN
  Administered 2019-10-16: 2.5 mL

## 2019-10-16 MED ORDER — CHLORHEXIDINE GLUCONATE CLOTH 2 % EX PADS: 6.0000 | MEDICATED_PAD | Freq: Once | CUTANEOUS | Status: DC

## 2019-10-16 MED ORDER — ACETAMINOPHEN 160 MG/5ML PO SOLN
325.0000 mg | ORAL | Status: DC | PRN
  Filled 2019-10-16: qty 20.3

## 2019-10-16 MED ORDER — CEFAZOLIN SODIUM-DEXTROSE 2-4 GM/100ML-% IV SOLN
2.0000 g | INTRAVENOUS | Status: AC
  Administered 2019-10-16: 2 g via INTRAVENOUS

## 2019-10-16 MED ORDER — SEVOFLURANE IN SOLN
RESPIRATORY_TRACT | Status: AC
  Filled 2019-10-16: qty 250

## 2019-10-16 MED ORDER — MIDAZOLAM HCL 2 MG/2ML IJ SOLN
INTRAMUSCULAR | Status: DC | PRN
  Administered 2019-10-16: 2 mg via INTRAVENOUS

## 2019-10-16 MED ORDER — PROPOFOL 10 MG/ML IV BOLUS
INTRAVENOUS | Status: AC
  Filled 2019-10-16: qty 20

## 2019-10-16 MED ORDER — PHENYLEPHRINE HCL (PRESSORS) 10 MG/ML IV SOLN
INTRAVENOUS | Status: DC | PRN
  Administered 2019-10-16 (×5): 100 ug via INTRAVENOUS

## 2019-10-16 MED ORDER — IBUPROFEN 800 MG PO TABS: 800.0000 mg | ORAL_TABLET | Freq: Three times a day (TID) | ORAL | 0 refills | Status: DC | PRN

## 2019-10-16 MED ORDER — DEXAMETHASONE SODIUM PHOSPHATE 10 MG/ML IJ SOLN
INTRAMUSCULAR | Status: DC | PRN
  Administered 2019-10-16: 10 mg via INTRAVENOUS

## 2019-10-16 MED ORDER — ACETAMINOPHEN 325 MG PO TABS: 325.0000 mg | ORAL_TABLET | ORAL | Status: DC | PRN

## 2019-10-16 MED ORDER — DOCUSATE SODIUM 100 MG PO CAPS: 100.0000 mg | ORAL_CAPSULE | Freq: Two times a day (BID) | ORAL | 0 refills | Status: AC | PRN

## 2019-10-16 MED ORDER — BUPIVACAINE-EPINEPHRINE 0.5% -1:200000 IJ SOLN
INTRAMUSCULAR | Status: DC | PRN
  Administered 2019-10-16: 2.5 mL

## 2019-10-16 MED ORDER — TECHNETIUM TC 99M SULFUR COLLOID FILTERED
1.0000 | Freq: Once | INTRAVENOUS | Status: AC | PRN
  Administered 2019-10-16: 11:00:00 0.935 via INTRADERMAL

## 2019-10-16 MED ORDER — ACETAMINOPHEN 500 MG PO TABS: 1000.0000 mg | ORAL_TABLET | ORAL | Status: DC

## 2019-10-16 SURGICAL SUPPLY — 42 items
APPLIER CLIP 11 MED OPEN (CLIP)
BLADE SURG 15 STRL LF DISP TIS (BLADE) ×1 IMPLANT
BLADE SURG 15 STRL SS (BLADE) ×1
CANISTER SUCT 1200ML W/VALVE (MISCELLANEOUS) ×2 IMPLANT
CHLORAPREP W/TINT 26 (MISCELLANEOUS) ×2 IMPLANT
CLIP APPLIE 11 MED OPEN (CLIP) IMPLANT
CNTNR SPEC 2.5X3XGRAD LEK (MISCELLANEOUS)
CONT SPEC 4OZ STER OR WHT (MISCELLANEOUS)
CONTAINER SPEC 2.5X3XGRAD LEK (MISCELLANEOUS) IMPLANT
COVER WAND RF STERILE (DRAPES) ×2 IMPLANT
DERMABOND ADVANCED (GAUZE/BANDAGES/DRESSINGS) ×1
DERMABOND ADVANCED .7 DNX12 (GAUZE/BANDAGES/DRESSINGS) ×1 IMPLANT
DEVICE DUBIN SPECIMEN MAMMOGRA (MISCELLANEOUS) ×2 IMPLANT
DRAPE LAPAROTOMY TRNSV 106X77 (MISCELLANEOUS) ×2 IMPLANT
ELECT CAUTERY BLADE TIP 2.5 (TIP) ×2
ELECT REM PT RETURN 9FT ADLT (ELECTROSURGICAL) ×2
ELECTRODE CAUTERY BLDE TIP 2.5 (TIP) ×1 IMPLANT
ELECTRODE REM PT RTRN 9FT ADLT (ELECTROSURGICAL) ×1 IMPLANT
GLOVE BIOGEL PI IND STRL 7.0 (GLOVE) ×1 IMPLANT
GLOVE BIOGEL PI INDICATOR 7.0 (GLOVE) ×1
GLOVE SURG SYN 6.5 ES PF (GLOVE) ×2 IMPLANT
GOWN STRL REUS W/ TWL LRG LVL3 (GOWN DISPOSABLE) ×3 IMPLANT
GOWN STRL REUS W/TWL LRG LVL3 (GOWN DISPOSABLE) ×3
JACKSON PRATT 10 (INSTRUMENTS) IMPLANT
KIT TURNOVER KIT A (KITS) ×2 IMPLANT
LABEL OR SOLS (LABEL) ×2 IMPLANT
LIGHT WAVEGUIDE WIDE FLAT (MISCELLANEOUS) ×2 IMPLANT
NEEDLE HYPO 22GX1.5 SAFETY (NEEDLE) ×4 IMPLANT
PACK BASIN MINOR ARMC (MISCELLANEOUS) ×2 IMPLANT
SLEVE PROBE SENORX GAMMA FIND (MISCELLANEOUS) ×2 IMPLANT
SUT MNCRL 4-0 (SUTURE) ×2
SUT MNCRL 4-0 27XMFL (SUTURE) ×2
SUT SILK 2 0 (SUTURE)
SUT SILK 2 0 SH (SUTURE) ×2 IMPLANT
SUT SILK 2-0 30XBRD TIE 12 (SUTURE) IMPLANT
SUT SILK 3 0 12 30 (SUTURE) IMPLANT
SUT VIC AB 3-0 SH 27 (SUTURE) ×2
SUT VIC AB 3-0 SH 27X BRD (SUTURE) ×2 IMPLANT
SUTURE MNCRL 4-0 27XMF (SUTURE) ×2 IMPLANT
SYR 20ML LL LF (SYRINGE) ×2 IMPLANT
SYR BULB 3OZ (MISCELLANEOUS) ×2 IMPLANT
WATER STERILE IRR 1000ML POUR (IV SOLUTION) ×2 IMPLANT

## 2019-10-16 NOTE — Interval H&P Note (Signed)
History and Physical Interval Note:  10/16/2019 1:13 PM  Bailey Brooks  has presented today for surgery, with the diagnosis of C50.411 Malignant neoplasm of upper-outer quadrant of Rt female breast, unspecified estrogen receptor status.  The various methods of treatment have been discussed with the patient and family. After consideration of risks, benefits and other options for treatment, the patient has consented to  Procedure(s): PARTIAL MASTECTOMY WITH NEEDLE LOCALIZATION AND AXILLARY SENTINEL LYMPH NODE BX (Right) as a surgical intervention.  The patient's history has been reviewed, patient examined, no change in status, stable for surgery.  I have reviewed the patient's chart and labs.  Questions were answered to the patient's satisfaction.     Flavia Bruss Lysle Pearl

## 2019-10-16 NOTE — Op Note (Signed)
Preoperative diagnosis: Right breast carcinoma.  Postoperative diagnosis: Same.   Procedure: needle-localized right breast partial mastectomy.                      Right axillary Sentinel Lymph node biopsy  Anesthesia: GETA  Surgeon: Dr. Benjamine Sprague  Wound Classification: Clean  Indications: Patient is a 68 y.o. female with a nonpalpable right breast mass noted on mammography with core biopsy demonstrating cancer requires needle-localized partial mastectomy for treatment with sentinel lymph node biopsy.   Specimen: Right breast mass, Sentinel Lymph nodes x 1  Complications: None  Estimated Blood Loss: 40 mL  Findings: 1. Specimen mammography shows marker and wire on specimen 2. Pathology call refers gross examination of margins was negative 3. No other palpable mass or lymph node identified.   Description of procedure: Preoperative needle localization was performed by radiology. In the nuclear medicine suite, the subareolar region was injected with Tc-99 sulfur colloid. Localization studies were reviewed. The patient was taken to the operating room and placed supine on the operating table, and after general anesthesia the right breast and axilla were prepped and draped in the usual sterile fashion. A time-out was completed verifying correct patient, procedure, site, positioning, and implant(s) and/or special equipment prior to beginning this procedure.  By comparing the localization studies with the direction and skin entry site of the needle, the probable trajectory and location of the mass was visualized. A skin incision was planned in such a way as to minimize the amount of dissection to reach the mass.  The skin incision was made after infusion of local. Flaps were raised and the location of the wire confirmed. The wire was delivered into the wound. Sharp and blunt dissection was then taken down to the mass  taking care to include the entire localizing needle and a margin of grossly  normal tissue. The specimen and entire localizing wire were removed. The specimen was oriented with long lateral, short superior, deep double sutures and sent to radiology with the localization studies. Confirmation was received that the entire target lesion had been resected. A hand-held gamma probe was used to identify the location of the hottest spot in the axilla. Sharp and blunt Dissection was carried down to subdermal facias through the lumpectomy incision. The probe was placed within wound and again, the point of maximal count was found. Dissection continue until nodule was identified. The probe was placed in contact with the node and 800s counts were recorded. The node was excised in its entirety. Ex vivo, the node measured 890 counts when placed on the probe. The bed of the node measured less than 60 counts.  No additional hot spots were identified. No clinically abnormal nodes were palpated. Wound irrigated, hemostasis was achieved and the wound closed in layers with  interrupted sutures of 3-0 Vicryl in deep dermal layer and a running subcuticular suture of Monocryl 4-0, then dressed with dermabond. The patient tolerated the procedure well and was taken to the postanesthesia care unit in stable condition. Sponge and instrument count correct at end of procedure.

## 2019-10-16 NOTE — Discharge Instructions (Signed)
Breast Cancer, Care After This sheet gives you information about how to care for yourself after your procedure. Your health care provider may also give you more specific instructions. If you have problems or questions, contact your health care provider. What can I expect after the procedure? After the procedure, it is common to have:  Soreness.  Bruising.  Itching. Follow these instructions at home: site care Follow instructions from your health care provider about how to take care of your site. Make sure you:  Wash your hands with soap and water before and after you change your bandage (dressing). If soap and water are not available, use hand sanitizer.  Leave stitches (sutures), skin glue, or adhesive strips in place. These skin closures may need to stay in place for 2 weeks or longer. If adhesive strip edges start to loosen and curl up, you may trim the loose edges. Do not remove adhesive strips completely unless your health care provider tells you to do that.  If the area bleeds or bruises, apply gentle pressure for 10 minutes.  OK TO SHOWER IN 24HRS  Check your site every day for signs of infection. Check for:  Redness, swelling, or pain.  Fluid or blood.  Warmth.  Pus or a bad smell.  General instructions  Rest and then return to your normal activities as told by your health care provider.   tylenol and advil as needed for discomfort.  Please alternate between the two every four hours as needed for pain.     Use narcotics, if prescribed, only when tylenol and motrin is not enough to control pain.   325-650mg  every 8hrs to max of 3000mg /24hrs  for the tylenol.     Advil up to 800mg  per dose every 8hrs as needed for pain.    Keep all follow-up visits as told by your health care provider. This is important. Contact a health care provider if:  You have redness, swelling, or pain around your site.  You have fluid or blood coming from your site.  Your site feels  warm to the touch.  You have pus or a bad smell coming from your site.  You have a fever.  Your sutures, skin glue, or adhesive strips loosen or come off sooner than expected. Get help right away if:  You have bleeding that does not stop with pressure or a dressing. Summary  After the procedure, it is common to have some soreness, bruising, and itching at the site.  Follow instructions from your health care provider about how to take care of your site.  Check your site every day for signs of infection.  Contact a health care provider if you have redness, swelling, or pain around your site, or your site feels warm to the touch.  Keep all follow-up visits as told by your health care provider. This is important. This information is not intended to replace advice given to you by your health care provider. Make sure you discuss any questions you have with your health care provider. Document Released: 12/17/2015 Document Revised: 05/20/2018 Document Reviewed: 05/20/2018 Elsevier Interactive Patient Education  2019 [place]   1) The drugs that you were given will stay in your system until tomorrow so for the next 24 hours you should not:  A) Drive an automobile B) Make any legal decisions C) Drink any alcoholic beverage   2) You may resume regular meals tomorrow.  Today it is better to start  with liquids and gradually work up to solid foods.  You may eat anything you prefer, but it is better to start with liquids, then soup and crackers, and gradually work up to solid foods.   3) Please notify your doctor immediately if you have any unusual bleeding, trouble breathing, redness and pain at the surgery site, drainage, fever, or pain not relieved by medication.    4) Additional Instructions:        Please contact your physician with any problems or Same Day Surgery at [phone], Monday through Friday 6 am to 4 pm, or  [place] at [facility] number at [phone].

## 2019-10-16 NOTE — Anesthesia Preprocedure Evaluation (Addendum)
Anesthesia Evaluation  Patient identified by MRN, date of birth, ID band Patient awake    Reviewed: Allergy & Precautions, H&P , NPO status , reviewed documented beta blocker date and time   Airway Mallampati: III  TM Distance: >3 FB     Dental  (+) Caps, Teeth Intact, Chipped   Pulmonary    Pulmonary exam normal        Cardiovascular hypertension, +CHF  Normal cardiovascular exam  2017 ECHO Study Conclusions  - Left ventricle: The cavity size was normal. Wall thickness was   increased in a pattern of mild LVH. There was mild focal basal   hypertrophy of the septum. Systolic function was normal. The   estimated ejection fraction was in the range of 60% to 65%. Wall   motion was normal; there were no regional wall motion   abnormalities. Doppler parameters are consistent with abnormal   left ventricular relaxation (grade 1 diastolic dysfunction). - Mitral valve: Calcified annulus. There was trivial regurgitation. - Left atrium: The atrium was mildly dilated. - Right ventricle: The cavity size was normal. Systolic function   was normal.    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 11/2016 Echo: EF 60-65%, no rwma, mild LVH, Gr1 DD,               triv MR, mildly dil LA, nl RV fxn. Nov 2011: Diffuse large B cell lymphoma (HCC)     Comment:  Dr Inez Pilgrim, Dr. Madelynn Done s/p [facility]OP and methotrexate,               c/b renal failure No date: Diverticulosis No date: Esophagitis No date: GERD (gastroesophageal reflux disease) 06/2011: H/O stem cell transplant ([place])     Comment:  a. in setting of lymphoma. No date: Hemorrhoids 22-Oct-2010: History of chemotherapy     Comment:  RHCOP/methotrexate-intrathecal No date: History of stress test     Comment:  a. 05/2010 Myoview: nl EF, no ischemia/infarct. No date: Hypercholesterolemia No date: Hypertension No date: Hypothyroidism No date: Morbid obesity ([place]) No date: [place] present     Comment:  IN SCALP FROM 2012 No date: Paget disease, extra mammary     Comment:  vulva, s/p resection 2014, Dr. Sabra Heck No date: Pagets disease, extra-mammary No date: TIA (transient ischemic attack)     Comment:  X 2 -LAST ONE IN 2017 Past Surgical History: 1985: ABDOMINAL HYSTERECTOMY     Comment:  Hysterectomy-partial 2002: BREAST BIOPSY     Comment:  Neg - AT [facility] No date: BURR HOLE W/ PLACEMENT OMMAYA RESERVOIR 2012: BURR HOLE W/ PLACEMENT OMMAYA RESERVOIR 04/2013: COLONOSCOPY 04/2013: ESOPHAGOGASTRODUODENOSCOPY 07/02/2018: INJECTION KNEE 2011: [place] W/ SUBCUTANEOUS PORT     Comment:  Port a Cath: Right chest Double Lumen, 04-Nov-2010 2012: LIMBAL STEM CELL TRANSPLANT No date: LIVER BIOPSY  Comment:  stage 4B large Bcell lymphoma 2016: ROTATOR CUFF REPAIR; Right 06/22/2015: SHOULDER ARTHROSCOPY; Right     Comment:  Procedure: ARTHROSCOPY SHOULDER, parital repair of               rotator cuff, biceps tenodesis, decompression and               debridement;  Surgeon: Corky Mull, MD;  Location: [facility]               ORS;  Service: Orthopedics;  Laterality: Right; 1954: TONSILLECTOMY No date: VULVECTOMY 09/29/2015: VULVECTOMY PARTIAL; N/A     Comment:  Procedure: VULVECTOMY PARTIAL;  Surgeon: Gillis Ends, MD;   Location: [facility] ORS;  Service: Gynecology;                Laterality: N/A;   Reproductive/Obstetrics                           Anesthesia Physical Anesthesia Plan  ASA: III  Anesthesia Plan: General   Post-op Pain Management:    Induction: Intravenous  PONV Risk Score and Plan: 3 and Ondansetron, Treatment may vary due to age or medical condition, Midazolam and Dexamethasone  Airway Management Planned: Oral ETT  Additional Equipment:   Intra-op Plan:   Post-operative Plan: Extubation in OR  Informed Consent: I have reviewed the patients History and Physical, chart, labs and discussed the procedure including the risks, benefits and alternatives for the proposed anesthesia with the patient or authorized representative who has indicated his/her understanding and acceptance.     Dental Advisory Given  Plan Discussed with: CRNA  Anesthesia Plan Comments:        Anesthesia Quick Evaluation

## 2019-10-16 NOTE — Anesthesia Procedure Notes (Signed)
Procedure Name: LMA Insertion Date/Time: 10/16/2019 1:39 PM Performed by: Johnna Acosta, CRNA Pre-anesthesia Checklist: Patient identified, Emergency Drugs available, Suction available, Patient being monitored and Timeout performed Patient Re-evaluated:Patient Re-evaluated prior to induction Oxygen Delivery Method: Circle system utilized Preoxygenation: Pre-oxygenation with 100% oxygen Induction Type: IV induction LMA: LMA inserted LMA Size: 4.5 Tube type: Oral Number of attempts: 1 Placement Confirmation: positive ETCO2 and breath sounds checked- equal and bilateral Tube secured with: Tape Dental Injury: Teeth and Oropharynx as per pre-operative assessment

## 2019-10-16 NOTE — Anesthesia Post-op Follow-up Note (Signed)
Anesthesia QCDR form completed.        

## 2019-10-16 NOTE — Transfer of Care (Signed)
Immediate Anesthesia Transfer of Care Note  Patient: Bailey Brooks  Procedure(s) Performed: PARTIAL MASTECTOMY WITH NEEDLE LOCALIZATION AND AXILLARY SENTINEL LYMPH NODE BX (Right Breast)  Patient Location: PACU  Anesthesia Type:General  Level of Consciousness: drowsy  Airway & Oxygen Therapy: Patient Spontanous Breathing and Patient connected to face mask oxygen  Post-op Assessment: Report given to RN and Post -op Vital signs reviewed and stable  Post vital signs: Reviewed and stable  Last Vitals:  Vitals Value Taken Time  BP 140/60 10/16/19 1516  Temp 36.2 C 10/16/19 1515  Pulse 82 10/16/19 1518  Resp 10 10/16/19 1518  SpO2 100 % 10/16/19 1518  Vitals shown include unvalidated device data.  Last Pain:  Vitals:   10/16/19 1143  PainSc: 0-No pain         Complications: No apparent anesthesia complications

## 2019-10-16 NOTE — Anesthesia Postprocedure Evaluation (Signed)
Anesthesia Post Note  Patient: Bailey Brooks  Procedure(s) Performed: PARTIAL MASTECTOMY WITH NEEDLE LOCALIZATION AND AXILLARY SENTINEL LYMPH NODE BX (Right Breast)  Patient location during evaluation: PACU Anesthesia Type: General Level of consciousness: awake and alert Pain management: pain level controlled Vital Signs Assessment: post-procedure vital signs reviewed and stable Respiratory status: spontaneous breathing, nonlabored ventilation and respiratory function stable Cardiovascular status: blood pressure returned to baseline and stable Postop Assessment: no apparent nausea or vomiting Anesthetic complications: no     Last Vitals:  Vitals:   10/16/19 1617 10/16/19 1708  BP: (!) 146/54 (!) 176/78  Pulse: 64 71  Resp: 16 18  Temp: (!) 36.4 C   SpO2: 98% 99%    Last Pain:  Vitals:   10/16/19 1708  TempSrc:   PainSc: [place]

## 2019-10-17 ENCOUNTER — Encounter: Payer: Self-pay | Admitting: Surgery

## 2019-10-17 NOTE — Telephone Encounter (Signed)
Received a call from Dr. Rogue Bussing office, fax received.  Nial Hawe,cma

## 2019-10-21 ENCOUNTER — Ambulatory Visit: Payer: BC Managed Care – PPO | Admitting: Internal Medicine

## 2019-10-21 LAB — SURGICAL PATHOLOGY

## 2019-10-27 ENCOUNTER — Other Ambulatory Visit: Payer: Self-pay

## 2019-10-27 NOTE — Progress Notes (Signed)
Patient pre screened for office appointment, no questions or concerns today. Patient reminded of upcoming appointment time and date. 

## 2019-10-28 ENCOUNTER — Other Ambulatory Visit: Payer: Self-pay

## 2019-10-28 ENCOUNTER — Inpatient Hospital Stay (HOSPITAL_BASED_OUTPATIENT_CLINIC_OR_DEPARTMENT_OTHER): Payer: BC Managed Care – PPO | Admitting: Internal Medicine

## 2019-10-28 DIAGNOSIS — M889 Osteitis deformans of unspecified bone: Secondary | ICD-10-CM | POA: Diagnosis not present

## 2019-10-28 DIAGNOSIS — Z7951 Long term (current) use of inhaled steroids: Secondary | ICD-10-CM | POA: Diagnosis not present

## 2019-10-28 DIAGNOSIS — Z170 Estrogen receptor positive status [ER+]: Secondary | ICD-10-CM

## 2019-10-28 DIAGNOSIS — C519 Malignant neoplasm of vulva, unspecified: Secondary | ICD-10-CM | POA: Diagnosis not present

## 2019-10-28 DIAGNOSIS — T461X5A Adverse effect of calcium-channel blockers, initial encounter: Secondary | ICD-10-CM | POA: Diagnosis not present

## 2019-10-28 DIAGNOSIS — C50411 Malignant neoplasm of upper-outer quadrant of right female breast: Secondary | ICD-10-CM | POA: Diagnosis not present

## 2019-10-28 DIAGNOSIS — I509 Heart failure, unspecified: Secondary | ICD-10-CM | POA: Diagnosis not present

## 2019-10-28 DIAGNOSIS — R600 Localized edema: Secondary | ICD-10-CM | POA: Diagnosis not present

## 2019-10-28 DIAGNOSIS — K2100 Gastro-esophageal reflux disease with esophagitis, without bleeding: Secondary | ICD-10-CM | POA: Diagnosis not present

## 2019-10-28 DIAGNOSIS — I110 Hypertensive heart disease with heart failure: Secondary | ICD-10-CM | POA: Diagnosis not present

## 2019-10-28 DIAGNOSIS — M1990 Unspecified osteoarthritis, unspecified site: Secondary | ICD-10-CM | POA: Diagnosis not present

## 2019-10-28 DIAGNOSIS — E039 Hypothyroidism, unspecified: Secondary | ICD-10-CM | POA: Diagnosis not present

## 2019-10-28 DIAGNOSIS — E7800 Pure hypercholesterolemia, unspecified: Secondary | ICD-10-CM | POA: Diagnosis not present

## 2019-10-28 DIAGNOSIS — C8338 Diffuse large B-cell lymphoma, lymph nodes of multiple sites: Secondary | ICD-10-CM | POA: Diagnosis not present

## 2019-10-28 DIAGNOSIS — Z9484 Stem cells transplant status: Secondary | ICD-10-CM | POA: Diagnosis not present

## 2019-10-28 DIAGNOSIS — Z9221 Personal history of antineoplastic chemotherapy: Secondary | ICD-10-CM | POA: Diagnosis not present

## 2019-10-28 NOTE — Progress Notes (Signed)
Spoke with patient. She requested an additional apt with Ander Purpura, NP to further discuss/educate herself and daughter in law for the application process. Pt states that her husband is unwilling to apply the cream. She states that she never received the cream from the pharmacy. I verified that the script was received by her local pharmacy. She states that she is reluctant to start on the cream without further education and assistance with application.   I spoke with Ander Purpura, NP. Pt's insurance denied Aldara cream and PA/appeal process is being initiated. Pt informed and gave verbal understanding of the plan of care. Lauren, NP will collaborate care with Dr.Secord.

## 2019-10-28 NOTE — Assessment & Plan Note (Addendum)
#  Right breast cancer stage I ER/PRPos; her2 negative.  Grade 1-would not recommend Oncotype.  Reviewed the pathology in detail.  Patient should have overall good prognosis-with cure in the order of 90%.  #Given the T1 a tumor; grade 1-I do not think patient would benefit from chemotherapy. Recommend postlumpectomy radiation.  We will make a referral to Dr. Donella Stade.   #Patient would definitely benefit from antihormone therapy for 5 years.  I discussed the mechanism of action of aromatase inhibitors-with blocking of estrogen to prevent breast cancer.  Also discussed the potential side effects including but not limited to arthralgias hot flashes and increased risk of osteoporosis.   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  No evidence of recurrence.  Stable.  # paget's disease of Vulva- status post recent Bx [Dr.Secord] neg for malignancy. Awaiting imiqiod-insurance approval.  # genetics- sister- breast cancer 84 [at 30 y]; no daughters; 2 grand daughters- refer to genetics  # DISPOSITION: # refer to Dr.Crystal # refer to genetics- re: Breast cancer # follow up in 2 months-MD- VIDEO; no labs- Dr.B  Cc; Dr.Sonnenberg.

## 2019-10-28 NOTE — Progress Notes (Signed)
[facility] OFFICE PROGRESS NOTE  Patient Care Team: Leone Haven, MD as PCP - General (Family Medicine) Clent Jacks, RN as Registered Nurse  Cancer Staging No matching staging information was found for the patient.   Oncology History Overview Note  # RIGHT BREAST CANCER stage I [pTapN0; G-1; ER-100%; PR-99%; Her-2-NEG; Dr.Sakai]. 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  female pleasant patient above history of diffuse large B-cell lymphoma; and history of new diagnosis of breast cancer is here for follow-up.  Patient had mammograms at [facility] was abnormal.  This led to further work-up including diagnostic mammogram/ultrasound and biopsy.  Core biopsies were noted to have "ductal cancer ER/PR positive HER-2 negative".  Patient underwent partial mastectomy with sentinel lymph node 2 weeks ago.  Patient is healing well from surgery.  Denies any worsening pain.    Review of Systems  Constitutional: Negative for chills, diaphoresis, fever, malaise/fatigue and weight loss.  HENT: Negative for nosebleeds and sore throat.   Eyes: Negative for double vision.  Respiratory: Negative for cough, hemoptysis, sputum production, shortness of breath and  wheezing.   Cardiovascular: Negative for chest pain, palpitations, orthopnea and leg swelling.  Gastrointestinal: Negative for abdominal pain, blood in stool, constipation, diarrhea, heartburn, melena, nausea and vomiting.  Genitourinary: Negative for dysuria, frequency and urgency.  Musculoskeletal: Negative for back pain and joint pain.  Skin: Negative.  Negative for itching and rash.  Neurological: Negative for dizziness, tingling, focal weakness, weakness and headaches.  Endo/Heme/Allergies: Does not bruise/bleed easily.  Psychiatric/Behavioral: Negative for depression. The patient is not nervous/anxious and does not have insomnia.       PAST MEDICAL HISTORY :  Past Medical History:  Diagnosis Date  . 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 Allergic rhinitis   . Anemia    WHILE GOING THRU CHEMO  . Arthritis   . CHF (congestive heart failure) ([place])    PT DENIES  . Diastolic dysfunction    a. 11/2016 Echo: EF 60-65%, no rwma, mild LVH, Gr1 DD, triv MR, mildly dil LA, nl RV fxn.  . Diffuse large B cell lymphoma [facility]) Nov 2011   Dr Inez Pilgrim, Dr. Madelynn Done s/p RCHOP and methotrexate, c/b renal failure  . Diverticulosis   . Esophagitis   . GERD (gastroesophageal reflux disease)   . H/O stem cell transplant ([place]) 06/2011   a. in setting of lymphoma.  . Hemorrhoids   . History of chemotherapy 22-Oct-2010   RHCOP/methotrexate-intrathecal  . History of stress test    a. 05/2010 Myoview: nl EF, no ischemia/infarct.  . Hypercholesterolemia   . Hypertension   . Hypothyroidism   . Morbid obesity ([place])   . 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 Paget disease, extra mammary    vulva, s/p resection 2014, Dr. Sabra Heck  . Pagets disease, extra-mammary   . TIA (transient ischemic attack)    X 2 -LAST ONE IN 2017    PAST SURGICAL HISTORY :   Past Surgical History:  Procedure Laterality Date  . ABDOMINAL HYSTERECTOMY   1985   Hysterectomy-partial  . BREAST BIOPSY  2002   Neg - AT [facility]  . BURR HOLE W/ PLACEMENT OMMAYA RESERVOIR    . BURR HOLE W/ PLACEMENT OMMAYA RESERVOIR  2012  . COLONOSCOPY  04/2013  . ESOPHAGOGASTRODUODENOSCOPY  04/2013  . INJECTION KNEE  07/02/2018  . INSERTION CENTRAL VENOUS ACCESS DEVICE W/ SUBCUTANEOUS PORT  2011   Port a Cath: Right chest Double Lumen, 04-Nov-2010  . LIMBAL STEM CELL TRANSPLANT  2012  . LIVER BIOPSY     stage 4B large Bcell lymphoma  . PARTIAL MASTECTOMY WITH NEEDLE LOCALIZATION AND AXILLARY SENTINEL LYMPH NODE BX Right 10/16/2019   Procedure: PARTIAL MASTECTOMY WITH NEEDLE LOCALIZATION AND AXILLARY SENTINEL LYMPH NODE BX;  Surgeon: Benjamine Sprague, DO;  Location: [facility] ORS;  Service: General;  Laterality: Right;  . ROTATOR CUFF REPAIR Right 2016  . SHOULDER ARTHROSCOPY Right 06/22/2015   Procedure: ARTHROSCOPY SHOULDER, parital repair of rotator cuff, biceps tenodesis, decompression and debridement;  Surgeon: Corky Mull, MD;  Location: [facility] ORS;  Service: Orthopedics;  Laterality: Right;  . TONSILLECTOMY  1954  . VULVECTOMY    . VULVECTOMY PARTIAL N/A 09/29/2015   Procedure: VULVECTOMY PARTIAL;  Surgeon: Gillis Ends, MD;  Location: [facility] ORS;  Service: Gynecology;  Laterality: N/A;    FAMILY HISTORY :   Family History  Problem Relation Age of Onset  . Diabetes Mother        died @ 104 of MI.  Marland Kitchen Hypertension Mother   . Heart attack Mother   . Melanoma Father 96       died of complications r/t melanoma w/ lung mets.  . Kidney disease Sister        Kidney removed   . Prostate cancer Brother        Prostate - dx in 46's  . Heart disease Brother 75       reported MI @ age 39, ? treated w/ TPA->no recurrent CAD, now in 45's.  Marland Kitchen Hearing loss Maternal Aunt   . Hearing loss Maternal Grandfather     SOCIAL HISTORY:   Social History   Tobacco Use  . Smoking status: Never Smoker  . Smokeless tobacco: Never Used  Substance Use Topics  . Alcohol  use: No    Alcohol/week: 0.0 standard drinks    Comment: former user-no current use  . Drug use: No    ALLERGIES:  is allergic to erythromycin; iodine; morphine and related; oxycodone; sulfa antibiotics; and adhesive [tape].  MEDICATIONS:  Current Outpatient Medications  Medication Sig Dispense Refill  . acetaminophen (TYLENOL) 325 MG tablet Take 2 tablets (650 mg total) by mouth every 8 (eight) hours as needed for mild pain. 40 tablet 0  . aspirin EC 81 MG tablet Take 81 mg by mouth daily.    Marland Kitchen atorvastatin (LIPITOR) 40 MG tablet TAKE 1 TABLET(40 MG) BY MOUTH DAILY (Patient taking differently: Take 40 mg by mouth every morning. ) 90 tablet 3  . chlorthalidone (HYGROTON) 25 MG tablet Take 0.5 tablets (12.5 mg total) by mouth daily. 45 tablet 1  . cholecalciferol (VITAMIN D3) 25 MCG (1000 UT) tablet  Take 1,000 Units by mouth daily.    . clobetasol cream (TEMOVATE) 0.98 % Apply 1 application topically daily as needed (psoriasis).     Marland Kitchen ibuprofen (ADVIL) 800 MG tablet Take 1 tablet (800 mg total) by mouth every 8 (eight) hours as needed for mild pain or moderate pain. 30 tablet 0  . lansoprazole (PREVACID) 30 MG capsule TAKE 1 CAPSULE(30 MG) BY MOUTH DAILY (Patient taking differently: Take 30 mg by mouth every morning. ) 90 capsule 1  . levothyroxine (SYNTHROID) 88 MCG tablet take 1 tablet by mouth every morning ON AN EMPTY STOMACH (Patient taking differently: Take 88 mcg by mouth daily before breakfast. ) 90 tablet 3  . lisinopril (ZESTRIL) 10 MG tablet Take 1 tablet (10 mg total) by mouth daily. (Patient taking differently: Take 10 mg by mouth every morning. ) 90 tablet 3  . mometasone (ELOCON) 0.1 % cream Apply 1 application topically daily as needed (eczema).   0  . Multiple Vitamin (MULTIVITAMIN) tablet Take 1 tablet by mouth daily.    Marland Kitchen Specialty Vitamins Products (ICAPS LUTEIN & ZEAXANTHIN PO) Take by mouth every morning. Zeaxanthin 84m/Lutein 193m    . imiquimod (ALDARA) 5 % cream Apply  topically 3 (three) times a week. (Patient not taking: Reported on 10/27/2019) 12 each 1   No current facility-administered medications for this visit.    PHYSICAL EXAMINATION: ECOG PERFORMANCE STATUS: 0 - Asymptomatic  BP (!) 153/83   Pulse 76   Temp 98.9 F (37.2 C) (Tympanic)   Resp (!) 22   Ht 5' 3"  (1.6 m)   Wt 243 lb (110.2 kg)   BMI 43.05 kg/m   Filed Weights   10/28/19 0938  Weight: 243 lb (110.2 kg)    Lifestyle  Physical activity  . Days per week: Not on file  . Minutes per session: Not on file   Physical Exam  Constitutional: She is oriented to person, place, and time and well-developed, well-nourished, and in no distress.  HENT:  Head: Normocephalic and atraumatic.  Mouth/Throat: Oropharynx is clear and moist. No oropharyngeal exudate.  Eyes: Pupils are equal, round, and reactive to light.  Neck: Normal range of motion. Neck supple.  Cardiovascular: Normal rate and regular rhythm.  Pulmonary/Chest: No respiratory distress. She has no wheezes.  Abdominal: Soft. Bowel sounds are normal. She exhibits no distension and no mass. There is no abdominal tenderness. There is no rebound and no guarding.  Musculoskeletal: Normal range of motion.        General: No tenderness or edema.  Neurological: She is alert and oriented to person, place, and time.  Skin: Skin is warm.  Psychiatric: Affect normal.   LABORATORY DATA:  I have reviewed the data as listed    Component Value Date/Time   NA 134 (L) 10/16/2019 0958   NA 138 09/03/2013 0809   K 4.2 10/16/2019 0958   K 4.3 11/02/2014 0932   CL 108 10/16/2019 0958   CL 105 09/03/2013 0809   CO2 28 10/13/2019 1510   CO2 28 09/03/2013 0809   GLUCOSE 109 (H) 10/16/2019 0958   GLUCOSE 100 (H) 09/03/2013 0809   BUN 20 10/16/2019 0958   BUN 17 09/03/2013 0809   CREATININE 0.80 10/16/2019 0958   CREATININE 0.96 11/02/2014 0929   CALCIUM 9.6 10/13/2019 1510   CALCIUM 9.1 09/03/2013 0809   PROT 7.0 12/20/2018 0839    PROT 6.6 11/02/2014 0929   ALBUMIN 3.7 12/20/2018 0839   ALBUMIN 3.3 (L) 11/02/2014  0929   AST 17 12/20/2018 0839   AST 16 11/02/2014 0929   ALT 11 12/20/2018 0839   ALT 19 11/02/2014 0929   ALKPHOS 96 12/20/2018 0839   ALKPHOS 87 11/02/2014 0929   BILITOT 0.5 12/20/2018 0839   BILITOT 0.3 11/02/2014 0929   GFRNONAA >60 10/13/2019 1510   GFRNONAA >60 11/02/2014 0929   GFRNONAA >60 07/27/2014 0836   GFRAA >60 10/13/2019 1510   GFRAA >60 11/02/2014 0929   GFRAA >60 07/27/2014 0836    No results found for: SPEP, UPEP  Lab Results  Component Value Date   WBC 8.0 10/13/2019   NEUTROABS 3.2 06/12/2018   HGB 13.9 10/16/2019   HCT 41.0 10/16/2019   MCV 85.9 10/13/2019   PLT 225 10/13/2019      Chemistry      Component Value Date/Time   NA 134 (L) 10/16/2019 0958   NA 138 09/03/2013 0809   K 4.2 10/16/2019 0958   K 4.3 11/02/2014 0932   CL 108 10/16/2019 0958   CL 105 09/03/2013 0809   CO2 28 10/13/2019 1510   CO2 28 09/03/2013 0809   BUN 20 10/16/2019 0958   BUN 17 09/03/2013 0809   CREATININE 0.80 10/16/2019 0958   CREATININE 0.96 11/02/2014 0929      Component Value Date/Time   CALCIUM 9.6 10/13/2019 1510   CALCIUM 9.1 09/03/2013 0809   ALKPHOS 96 12/20/2018 0839   ALKPHOS 87 11/02/2014 0929   AST 17 12/20/2018 0839   AST 16 11/02/2014 0929   ALT 11 12/20/2018 0839   ALT 19 11/02/2014 0929   BILITOT 0.5 12/20/2018 0839   BILITOT 0.3 11/02/2014 0929       RADIOGRAPHIC STUDIES: I have personally reviewed the radiological images as listed and agreed with the findings in the report. No results found.   ASSESSMENT & PLAN:  Carcinoma of upper-outer quadrant of right breast in female, estrogen receptor positive ([place]) #Right breast cancer stage I ER/PRPos; her2 negative.  Grade 1-would not recommend Oncotype.  Reviewed the pathology in detail.  Patient should have overall good prognosis-with cure in the order of 90%.  #Given the T1 a tumor; grade 1-I do not  think patient would benefit from chemotherapy. Recommend postlumpectomy radiation.  We will make a referral to Dr. Donella Stade.   #Patient would definitely benefit from antihormone therapy for 5 years.  I discussed the mechanism of action of aromatase inhibitors-with blocking of estrogen to prevent breast cancer.  Also discussed the potential side effects including but not limited to arthralgias hot flashes and increased risk of osteoporosis.   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  No evidence of recurrence.  Stable.  # paget's disease of Vulva- status post recent Bx [Dr.Secord] neg for malignancy. Awaiting imiqiod-insurance approval.  # genetics- sister- breast cancer 64 [at 52 y]; no daughters; 2 grand daughters- refer to genetics  # DISPOSITION: # refer to Dr.Crystal # refer to genetics- re: Breast cancer # follow up in 2 months-MD- VIDEO; no labs- Dr.B  Cc; Dr.Sonnenberg.    Orders Placed This Encounter  Procedures  . Ambulatory referral to Genetics    Referral Priority:   Routine    Referral Type:   Consultation    Referral Reason:   Specialty Services Required    Number of Visits Requested:   1   All questions were answered. The patient knows to call the clinic with any problems, questions or concerns.  Cammie Sickle, MD 10/28/2019 1:05 PM

## 2019-11-03 ENCOUNTER — Encounter: Payer: Self-pay | Admitting: Nurse Practitioner

## 2019-11-04 ENCOUNTER — Ambulatory Visit: Payer: BC Managed Care – PPO | Admitting: Internal Medicine

## 2019-11-05 ENCOUNTER — Other Ambulatory Visit: Payer: Self-pay

## 2019-11-06 ENCOUNTER — Other Ambulatory Visit: Payer: Self-pay

## 2019-11-06 ENCOUNTER — Ambulatory Visit
Admission: RE | Admit: 2019-11-06 | Discharge: 2019-11-06 | Disposition: A | Discharge status: Home / Self Care | Payer: BC Managed Care – PPO | Source: Ambulatory Visit | Attending: Radiation Oncology | Admitting: Radiation Oncology

## 2019-11-06 ENCOUNTER — Encounter: Payer: Self-pay | Admitting: Radiation Oncology

## 2019-11-06 DIAGNOSIS — D649 Anemia, unspecified: Secondary | ICD-10-CM | POA: Diagnosis not present

## 2019-11-06 DIAGNOSIS — E039 Hypothyroidism, unspecified: Secondary | ICD-10-CM | POA: Diagnosis not present

## 2019-11-06 DIAGNOSIS — E7800 Pure hypercholesterolemia, unspecified: Secondary | ICD-10-CM | POA: Insufficient documentation

## 2019-11-06 DIAGNOSIS — Z8673 Personal history of transient ischemic attack (TIA), and cerebral infarction without residual deficits: Secondary | ICD-10-CM | POA: Insufficient documentation

## 2019-11-06 DIAGNOSIS — E669 Obesity, unspecified: Secondary | ICD-10-CM | POA: Diagnosis not present

## 2019-11-06 DIAGNOSIS — Z9221 Personal history of antineoplastic chemotherapy: Secondary | ICD-10-CM | POA: Insufficient documentation

## 2019-11-06 DIAGNOSIS — I10 Essential (primary) hypertension: Secondary | ICD-10-CM | POA: Insufficient documentation

## 2019-11-06 DIAGNOSIS — Z8572 Personal history of non-Hodgkin lymphomas: Secondary | ICD-10-CM | POA: Diagnosis not present

## 2019-11-06 DIAGNOSIS — Z170 Estrogen receptor positive status [ER+]: Secondary | ICD-10-CM | POA: Diagnosis not present

## 2019-11-06 DIAGNOSIS — Z79899 Other long term (current) drug therapy: Secondary | ICD-10-CM | POA: Diagnosis not present

## 2019-11-06 DIAGNOSIS — I509 Heart failure, unspecified: Secondary | ICD-10-CM | POA: Insufficient documentation

## 2019-11-06 DIAGNOSIS — C50411 Malignant neoplasm of upper-outer quadrant of right female breast: Secondary | ICD-10-CM

## 2019-11-06 DIAGNOSIS — C519 Malignant neoplasm of vulva, unspecified: Secondary | ICD-10-CM | POA: Diagnosis not present

## 2019-11-06 DIAGNOSIS — K219 Gastro-esophageal reflux disease without esophagitis: Secondary | ICD-10-CM | POA: Insufficient documentation

## 2019-11-06 DIAGNOSIS — M129 Arthropathy, unspecified: Secondary | ICD-10-CM | POA: Insufficient documentation

## 2019-11-06 DIAGNOSIS — Z7982 Long term (current) use of aspirin: Secondary | ICD-10-CM | POA: Insufficient documentation

## 2019-11-06 NOTE — Consult Note (Signed)
NEW PATIENT EVALUATION  Name: Bailey Brooks  MRN: [id number]  Date:   11/06/2019     DOB: 1951/01/14   This 68 y.o. female patient presents to the clinic for initial evaluation of stage Ia invasive mammary carcinoma (T1 a N0 M0) ER positive PR positive HER-2 negative invasive mammary carcinoma of the right breast status post wide local excision and sentinel node biopsy.  REFERRING PHYSICIAN: Leone Haven, MD  CHIEF COMPLAINT:  Chief Complaint  Patient presents with  . Breast Cancer    Initial consultation    DIAGNOSIS: The encounter diagnosis was Carcinoma of upper-outer quadrant of right breast in female, estrogen receptor positive ([place]).   PREVIOUS INVESTIGATIONS:  Mammogram and ultrasound reviewed Pathology report reviewed Clinical notes reviewed  HPI: Patient is a 68 year old female with history of diffuse large B-cell lymphoma presenting with a mesenteric mass and liver involvement status post chemotherapy and autotransplant back in June 2012 at [facility].  She is also being treated for localized extramammary Paget's disease of the vulva which she tells me has recurred she is seeing Dr. Theora Gianotti there.  She recently presented with an abnormal mammogram of the right breast showing architectural distortion measuring approximately 15 x 13 mm in the upper outer quadrant middle to posterior depth confirmed on ultrasound.  Benign simple cysts were also seen.  The hypoechoic mass was concerning for malignancy and she underwent ultrasound-guided biopsy positive for invasive mammary carcinoma.  She then underwent a wide local excision for a 4 x 3 x 1 mm overall grade 1 invasive mammary carcinoma with extensive ductal carcinoma in situ.  Invasive mammary carcinoma was uninvolved at the margin.  Margins was clear but close at 5 mm.  1 sentinel lymph node was negative for malignancy.  She has done well postoperatively.  Still has some soreness in her right axilla secondary to scarring and  occasionally shooting pains which is again postsurgical.  She specifically denies breast tenderness cough or bone pain.  She has been seen by medical oncology is not thought to be a candidate based on the size and stage of her tumor for systemic chemotherapy will be on antiestrogen therapy after completion of radiation.  PLANNED TREATMENT REGIMEN: Right hypofractionated whole breast radiation.  PAST MEDICAL HISTORY:  has a past medical history of Adult pulmonary Langerhans cell histiocytosis ([place]), Allergic rhinitis, Anemia, Arthritis, CHF (congestive heart failure) ([place]), Diastolic dysfunction, Diffuse large B cell lymphoma [facility]) (Nov 2011), Diverticulosis, Esophagitis, GERD (gastroesophageal reflux disease), H/O stem cell transplant ([place]) (06/2011), Hemorrhoids, History of chemotherapy (22-Oct-2010), History of stress test, Hypercholesterolemia, Hypertension, Hypothyroidism, Morbid obesity ([place]), Ommaya reservoir present, Paget disease, extra mammary, Pagets disease, extra-mammary, and TIA (transient ischemic attack).    PAST SURGICAL HISTORY:  Past Surgical History:  Procedure Laterality Date  . ABDOMINAL HYSTERECTOMY  1985   Hysterectomy-partial  . BREAST BIOPSY  2002   Neg - AT [facility]  . BURR HOLE W/ PLACEMENT OMMAYA RESERVOIR    . BURR HOLE W/ PLACEMENT OMMAYA RESERVOIR  2012  . COLONOSCOPY  04/2013  . ESOPHAGOGASTRODUODENOSCOPY  04/2013  . INJECTION KNEE  07/02/2018  . INSERTION CENTRAL VENOUS ACCESS DEVICE W/ SUBCUTANEOUS PORT  2011   Port a Cath: Right chest Double Lumen, 04-Nov-2010  . LIMBAL STEM CELL TRANSPLANT  2012  . LIVER BIOPSY     stage 4B large Bcell lymphoma  . PARTIAL MASTECTOMY WITH NEEDLE LOCALIZATION AND AXILLARY SENTINEL LYMPH NODE BX Right 10/16/2019   Procedure: PARTIAL MASTECTOMY WITH NEEDLE LOCALIZATION AND  AXILLARY SENTINEL LYMPH NODE BX;  Surgeon: Benjamine Sprague, DO;  Location: [facility] ORS;  Service: General;  Laterality: Right;  . ROTATOR CUFF REPAIR Right 2016  .  SHOULDER ARTHROSCOPY Right 06/22/2015   Procedure: ARTHROSCOPY SHOULDER, parital repair of rotator cuff, biceps tenodesis, decompression and debridement;  Surgeon: Corky Mull, MD;  Location: [facility] ORS;  Service: Orthopedics;  Laterality: Right;  . TONSILLECTOMY  1954  . VULVECTOMY    . VULVECTOMY PARTIAL N/A 09/29/2015   Procedure: VULVECTOMY PARTIAL;  Surgeon: Gillis Ends, MD;  Location: [facility] ORS;  Service: Gynecology;  Laterality: N/A;    FAMILY HISTORY: family history includes Diabetes in her mother; Hearing loss in her maternal aunt and maternal grandfather; Heart attack in her mother; Heart disease (age of onset: 97) in her brother; Hypertension in her mother; Kidney disease in her sister; Melanoma (age of onset: 48) in her father; Prostate cancer in her brother.  SOCIAL HISTORY:  reports that she has never smoked. She has never used smokeless tobacco. She reports that she does not drink alcohol or use drugs.  ALLERGIES: Erythromycin, Iodine, Morphine and related, Oxycodone, Sulfa antibiotics, and Adhesive [tape]  MEDICATIONS:  Current Outpatient Medications  Medication Sig Dispense Refill  . acetaminophen (TYLENOL) 325 MG tablet Take 2 tablets (650 mg total) by mouth every 8 (eight) hours as needed for mild pain. 40 tablet 0  . aspirin EC 81 MG tablet Take 81 mg by mouth daily.    Marland Kitchen atorvastatin (LIPITOR) 40 MG tablet TAKE 1 TABLET(40 MG) BY MOUTH DAILY (Patient taking differently: Take 40 mg by mouth every morning. ) 90 tablet 3  . chlorthalidone (HYGROTON) 25 MG tablet Take 0.5 tablets (12.5 mg total) by mouth daily. 45 tablet 1  . cholecalciferol (VITAMIN D3) 25 MCG (1000 UT) tablet Take 1,000 Units by mouth daily.    . clobetasol cream (TEMOVATE) 9.02 % Apply 1 application topically daily as needed (psoriasis).     Marland Kitchen ibuprofen (ADVIL) 800 MG tablet Take 1 tablet (800 mg total) by mouth every 8 (eight) hours as needed for mild pain or moderate pain. 30 tablet 0  .  lansoprazole (PREVACID) 30 MG capsule TAKE 1 CAPSULE(30 MG) BY MOUTH DAILY (Patient taking differently: Take 30 mg by mouth every morning. ) 90 capsule 1  . levothyroxine (SYNTHROID) 88 MCG tablet take 1 tablet by mouth every morning ON AN EMPTY STOMACH (Patient taking differently: Take 88 mcg by mouth daily before breakfast. ) 90 tablet 3  . lisinopril (ZESTRIL) 10 MG tablet Take 1 tablet (10 mg total) by mouth daily. (Patient taking differently: Take 10 mg by mouth every morning. ) 90 tablet 3  . mometasone (ELOCON) 0.1 % cream Apply 1 application topically daily as needed (eczema).   0  . Multiple Vitamin (MULTIVITAMIN) tablet Take 1 tablet by mouth daily.    Marland Kitchen Specialty Vitamins Products (ICAPS LUTEIN & ZEAXANTHIN PO) Take by mouth every morning. Zeaxanthin 94m/Lutein 167m     No current facility-administered medications for this encounter.     ECOG PERFORMANCE STATUS:  0 - Asymptomatic  REVIEW OF SYSTEMS: Patient denies any weight loss, fatigue, weakness, fever, chills or night sweats. Patient denies any loss of vision, blurred vision. Patient denies any ringing  of the ears or hearing loss. No irregular heartbeat. Patient denies heart murmur or history of fainting. Patient denies any chest pain or pain radiating to her upper extremities. Patient denies any shortness of breath, difficulty breathing at night, cough or  hemoptysis. Patient denies any swelling in the lower legs. Patient denies any nausea vomiting, vomiting of blood, or coffee ground material in the vomitus. Patient denies any stomach pain. Patient states has had normal bowel movements no significant constipation or diarrhea. Patient denies any dysuria, hematuria or significant nocturia. Patient denies any problems walking, swelling in the joints or loss of balance. Patient denies any skin changes, loss of hair or loss of weight. Patient denies any excessive worrying or anxiety or significant depression. Patient denies any problems  with insomnia. Patient denies excessive thirst, polyuria, polydipsia. Patient denies any swollen glands, patient denies easy bruising or easy bleeding. Patient denies any recent infections, allergies or URI. Patient "s visual fields have not changed significantly in recent time.   PHYSICAL EXAM: BP (!) (P) 142/82 (BP Location: Right Arm, Patient Position: Sitting)   Pulse (P) 75   Temp (P) 98.3 F (36.8 C) (Tympanic)   Resp (P) 18   Wt (P) 245 lb 8 oz (111.4 kg)   BMI (P) 43.49 kg/m  Patient status post wide local excision of the upper outer quadrant of the right breast no dominant mass or nodularity is noted.  No axillary or supraclavicular adenopathy is appreciated.  Well-developed well-nourished patient in NAD. HEENT reveals PERLA, EOMI, discs not visualized.  Oral cavity is clear. No oral mucosal lesions are identified. Neck is clear without evidence of cervical or supraclavicular adenopathy. Lungs are clear to A&P. Cardiac examination is essentially unremarkable with regular rate and rhythm without murmur rub or thrill. Abdomen is benign with no organomegaly or masses noted. Motor sensory and DTR levels are equal and symmetric in the upper and lower extremities. Cranial nerves II through XII are grossly intact. Proprioception is intact. No peripheral adenopathy or edema is identified. No motor or sensory levels are noted. Crude visual fields are within normal range.  LABORATORY DATA: Pathology report reviewed    RADIOLOGY RESULTS: Mammograms and ultrasound report reviewed and compatible with above-stated findings   IMPRESSION: Stage Ia invasive mammary carcinoma the right breast status post wide local excision and sentinel node biopsy ER/PR positive HER-2/neu negative in 68 year old female who is status post autotransplantation for diffuse large B-cell lymphoma  PLAN: At this time I recommended a hypofractionated course of radiation therapy to her right breast over 3 weeks.  I would also  boost her scar another 1400 cGy using electron beam.  Risks and benefits of treatment including skin reaction fatigue alteration of blood counts possible inclusion of superficial lung all were discussed in detail with the patient.  I personally set up and ordered CT simulation for next week.  Patient comprehends my treatment plan well.  She also will benefit from antiestrogen therapy after completion of radiation.  I would like to take this opportunity to thank you for allowing me to participate in the care of your patient.Noreene Filbert, MD

## 2019-11-07 ENCOUNTER — Encounter: Payer: Self-pay | Admitting: Family Medicine

## 2019-11-07 ENCOUNTER — Encounter: Payer: Self-pay | Admitting: Obstetrics and Gynecology

## 2019-11-10 ENCOUNTER — Other Ambulatory Visit: Payer: Self-pay

## 2019-11-11 ENCOUNTER — Ambulatory Visit
Admission: RE | Admit: 2019-11-11 | Discharge: 2019-11-11 | Disposition: A | Discharge status: Home / Self Care | Payer: BC Managed Care – PPO | Source: Ambulatory Visit | Attending: Radiation Oncology | Admitting: Radiation Oncology

## 2019-11-11 ENCOUNTER — Other Ambulatory Visit: Payer: Self-pay

## 2019-11-11 DIAGNOSIS — C50911 Malignant neoplasm of unspecified site of right female breast: Secondary | ICD-10-CM | POA: Diagnosis not present

## 2019-11-11 DIAGNOSIS — Z510 Encounter for antineoplastic radiation therapy: Secondary | ICD-10-CM | POA: Diagnosis not present

## 2019-11-11 DIAGNOSIS — C50411 Malignant neoplasm of upper-outer quadrant of right female breast: Secondary | ICD-10-CM | POA: Diagnosis not present

## 2019-11-11 DIAGNOSIS — Z170 Estrogen receptor positive status [ER+]: Secondary | ICD-10-CM | POA: Diagnosis not present

## 2019-11-13 DIAGNOSIS — C50911 Malignant neoplasm of unspecified site of right female breast: Secondary | ICD-10-CM | POA: Diagnosis not present

## 2019-11-13 DIAGNOSIS — Z170 Estrogen receptor positive status [ER+]: Secondary | ICD-10-CM | POA: Diagnosis not present

## 2019-11-13 DIAGNOSIS — C50411 Malignant neoplasm of upper-outer quadrant of right female breast: Secondary | ICD-10-CM | POA: Diagnosis not present

## 2019-11-13 DIAGNOSIS — Z510 Encounter for antineoplastic radiation therapy: Secondary | ICD-10-CM | POA: Diagnosis not present

## 2019-11-18 ENCOUNTER — Other Ambulatory Visit: Payer: Self-pay

## 2019-11-18 ENCOUNTER — Ambulatory Visit
Admission: RE | Admit: 2019-11-18 | Discharge: 2019-11-18 | Disposition: A | Discharge status: Home / Self Care | Payer: BC Managed Care – PPO | Source: Ambulatory Visit | Attending: Radiation Oncology | Admitting: Radiation Oncology

## 2019-11-18 DIAGNOSIS — Z510 Encounter for antineoplastic radiation therapy: Secondary | ICD-10-CM | POA: Diagnosis not present

## 2019-11-18 DIAGNOSIS — C50911 Malignant neoplasm of unspecified site of right female breast: Secondary | ICD-10-CM | POA: Diagnosis not present

## 2019-11-18 DIAGNOSIS — C50411 Malignant neoplasm of upper-outer quadrant of right female breast: Secondary | ICD-10-CM | POA: Diagnosis not present

## 2019-11-18 DIAGNOSIS — Z170 Estrogen receptor positive status [ER+]: Secondary | ICD-10-CM | POA: Diagnosis not present

## 2019-11-19 ENCOUNTER — Other Ambulatory Visit: Payer: Self-pay

## 2019-11-19 ENCOUNTER — Ambulatory Visit
Admission: RE | Admit: 2019-11-19 | Discharge: 2019-11-19 | Disposition: A | Discharge status: Home / Self Care | Payer: BC Managed Care – PPO | Source: Ambulatory Visit | Attending: Radiation Oncology | Admitting: Radiation Oncology

## 2019-11-19 DIAGNOSIS — C50411 Malignant neoplasm of upper-outer quadrant of right female breast: Secondary | ICD-10-CM | POA: Diagnosis not present

## 2019-11-19 DIAGNOSIS — C50911 Malignant neoplasm of unspecified site of right female breast: Secondary | ICD-10-CM | POA: Diagnosis not present

## 2019-11-19 DIAGNOSIS — Z510 Encounter for antineoplastic radiation therapy: Secondary | ICD-10-CM | POA: Diagnosis not present

## 2019-11-19 DIAGNOSIS — Z170 Estrogen receptor positive status [ER+]: Secondary | ICD-10-CM | POA: Diagnosis not present

## 2019-11-20 ENCOUNTER — Other Ambulatory Visit: Payer: Self-pay

## 2019-11-20 ENCOUNTER — Ambulatory Visit
Admission: RE | Admit: 2019-11-20 | Discharge: 2019-11-20 | Disposition: A | Discharge status: Home / Self Care | Payer: BC Managed Care – PPO | Source: Ambulatory Visit | Attending: Radiation Oncology | Admitting: Radiation Oncology

## 2019-11-20 DIAGNOSIS — Z510 Encounter for antineoplastic radiation therapy: Secondary | ICD-10-CM | POA: Diagnosis not present

## 2019-11-20 DIAGNOSIS — C50411 Malignant neoplasm of upper-outer quadrant of right female breast: Secondary | ICD-10-CM | POA: Diagnosis not present

## 2019-11-20 DIAGNOSIS — C50911 Malignant neoplasm of unspecified site of right female breast: Secondary | ICD-10-CM | POA: Diagnosis not present

## 2019-11-20 DIAGNOSIS — Z170 Estrogen receptor positive status [ER+]: Secondary | ICD-10-CM | POA: Diagnosis not present

## 2019-11-21 ENCOUNTER — Ambulatory Visit
Admission: RE | Admit: 2019-11-21 | Discharge: 2019-11-21 | Disposition: A | Discharge status: Home / Self Care | Payer: BC Managed Care – PPO | Source: Ambulatory Visit | Attending: Radiation Oncology | Admitting: Radiation Oncology

## 2019-11-21 ENCOUNTER — Other Ambulatory Visit: Payer: Self-pay

## 2019-11-21 DIAGNOSIS — C50911 Malignant neoplasm of unspecified site of right female breast: Secondary | ICD-10-CM | POA: Diagnosis not present

## 2019-11-21 DIAGNOSIS — Z170 Estrogen receptor positive status [ER+]: Secondary | ICD-10-CM | POA: Diagnosis not present

## 2019-11-21 DIAGNOSIS — C50411 Malignant neoplasm of upper-outer quadrant of right female breast: Secondary | ICD-10-CM | POA: Diagnosis not present

## 2019-11-21 DIAGNOSIS — Z510 Encounter for antineoplastic radiation therapy: Secondary | ICD-10-CM | POA: Diagnosis not present

## 2019-11-24 ENCOUNTER — Ambulatory Visit
Admission: RE | Admit: 2019-11-24 | Discharge: 2019-11-24 | Disposition: A | Discharge status: Home / Self Care | Payer: BC Managed Care – PPO | Source: Ambulatory Visit | Attending: Radiation Oncology | Admitting: Radiation Oncology

## 2019-11-24 ENCOUNTER — Other Ambulatory Visit: Payer: Self-pay

## 2019-11-24 DIAGNOSIS — C50911 Malignant neoplasm of unspecified site of right female breast: Secondary | ICD-10-CM | POA: Diagnosis not present

## 2019-11-24 DIAGNOSIS — Z170 Estrogen receptor positive status [ER+]: Secondary | ICD-10-CM | POA: Diagnosis not present

## 2019-11-24 DIAGNOSIS — Z510 Encounter for antineoplastic radiation therapy: Secondary | ICD-10-CM | POA: Diagnosis not present

## 2019-11-25 ENCOUNTER — Ambulatory Visit (INDEPENDENT_AMBULATORY_CARE_PROVIDER_SITE_OTHER): Payer: BC Managed Care – PPO | Admitting: Family Medicine

## 2019-11-25 ENCOUNTER — Ambulatory Visit
Admission: RE | Admit: 2019-11-25 | Discharge: 2019-11-25 | Disposition: A | Discharge status: Home / Self Care | Payer: BC Managed Care – PPO | Source: Ambulatory Visit | Attending: Radiation Oncology | Admitting: Radiation Oncology

## 2019-11-25 ENCOUNTER — Encounter: Payer: Self-pay | Admitting: Family Medicine

## 2019-11-25 ENCOUNTER — Other Ambulatory Visit: Payer: Self-pay

## 2019-11-25 VITALS — BP 120/80 | HR 92 | Temp 96.2°F | Ht 63.0 in | Wt 242.8 lb

## 2019-11-25 DIAGNOSIS — E785 Hyperlipidemia, unspecified: Secondary | ICD-10-CM | POA: Diagnosis not present

## 2019-11-25 DIAGNOSIS — R7303 Prediabetes: Secondary | ICD-10-CM

## 2019-11-25 DIAGNOSIS — C50411 Malignant neoplasm of upper-outer quadrant of right female breast: Secondary | ICD-10-CM

## 2019-11-25 DIAGNOSIS — E039 Hypothyroidism, unspecified: Secondary | ICD-10-CM

## 2019-11-25 DIAGNOSIS — Z170 Estrogen receptor positive status [ER+]: Secondary | ICD-10-CM | POA: Diagnosis not present

## 2019-11-25 DIAGNOSIS — M25551 Pain in right hip: Secondary | ICD-10-CM

## 2019-11-25 DIAGNOSIS — I10 Essential (primary) hypertension: Secondary | ICD-10-CM | POA: Diagnosis not present

## 2019-11-25 DIAGNOSIS — Z510 Encounter for antineoplastic radiation therapy: Secondary | ICD-10-CM | POA: Diagnosis not present

## 2019-11-25 DIAGNOSIS — C50911 Malignant neoplasm of unspecified site of right female breast: Secondary | ICD-10-CM | POA: Diagnosis not present

## 2019-11-25 LAB — TSH: TSH: 3.02 u[IU]/mL (ref 0.35–4.50)

## 2019-11-25 LAB — LDL CHOLESTEROL, DIRECT: Direct LDL: 84 mg/dL

## 2019-11-25 LAB — COMPREHENSIVE METABOLIC PANEL
ALT: 16 U/L (ref 0–35)
AST: 19 U/L (ref 0–37)
Albumin: 3.8 g/dL (ref 3.5–5.2)
Alkaline Phosphatase: 90 U/L (ref 39–117)
BUN: 21 mg/dL (ref 6–23)
CO2: 31 mEq/L (ref 19–32)
Calcium: 9.6 mg/dL (ref 8.4–10.5)
Chloride: 101 mEq/L (ref 96–112)
Creatinine, Ser: 0.9 mg/dL (ref 0.40–1.20)
GFR: 61.93 mL/min (ref >60.00)
Glucose, Bld: 107 mg/dL — ABNORMAL HIGH (ref 70–99)
Potassium: 3.3 mEq/L — ABNORMAL LOW (ref 3.5–5.1)
Sodium: 141 mEq/L (ref 135–145)
Total Bilirubin: 0.5 mg/dL (ref 0.2–1.2)
Total Protein: 6.4 g/dL (ref 6.0–8.3)

## 2019-11-25 LAB — HEMOGLOBIN A1C: Hgb A1c MFr Bld: 5.8 % (ref 4.6–6.5)

## 2019-11-25 NOTE — Assessment & Plan Note (Signed)
Overall appears to be doing quite well.  She will continue her management through her specialist.

## 2019-11-25 NOTE — Assessment & Plan Note (Signed)
Adequately controlled.  Continue current regimen.  Lab work today.

## 2019-11-25 NOTE — Progress Notes (Signed)
  [name], MD Phone: [phone]  Bailey Brooks is a 68 y.o. female who presents today for follow-up.  Hypertension: Typically 120s over 80s.  Taking lisinopril and chlorthalidone.  No chest pain, shortness breath, or edema.  Hyperlipidemia: Taking Lipitor.  No right upper quadrant pain or myalgias.  Breast cancer: Patient underwent surgery and notes this went well.  She started radiation last week and has done well with that.  Denies current fatigue.  No skin changes with radiation.  Hypothyroidism: Taking Synthroid.  She does have stable skin dryness.  She has had chronic cold intolerance.  She did have some fatigue after her surgery though that has improved.  Osteoarthritis: Notes her right hip was bothering her and she was having trouble walking prior to her surgery.  She did make an appointment with orthopedics though did not go through with this as it improved.  Occasionally bothers her every couple of days.  Social History   Tobacco Use  Smoking Status Never Smoker  Smokeless Tobacco Never Used     ROS see history of present illness  Objective  Physical Exam Vitals:   11/25/19 0929  BP: 120/80  Pulse: 92  Temp: (!) 96.2 F (35.7 C)  SpO2: 97%    BP Readings from Last 3 Encounters:  11/25/19 120/80  11/06/19 (!) (P) 142/82  10/28/19 (!) 153/83   Wt Readings from Last 3 Encounters:  11/25/19 242 lb 12.8 oz (110.1 kg)  11/06/19 (P) 245 lb 8 oz (111.4 kg)  10/28/19 243 lb (110.2 kg)    Physical Exam Constitutional:      General: She is not in acute distress.    Appearance: She is not diaphoretic.  Neck:     Thyroid: No thyroid mass, thyromegaly or thyroid tenderness.  Cardiovascular:     Rate and Rhythm: Normal rate and regular rhythm.     Heart sounds: Normal heart sounds.  Pulmonary:     Effort: Pulmonary effort is normal.     Breath sounds: Normal breath sounds.  Skin:    General: Skin is warm and dry.  Neurological:     Mental Status:  She is alert.      Assessment/Plan: Please see individual problem list.  Essential hypertension Adequately controlled.  Continue current regimen.  Lab work today.  Hypothyroidism Check TSH.  Continue Synthroid.  Carcinoma of upper-outer quadrant of right breast in female, estrogen receptor positive ([place]) Overall appears to be doing quite well.  She will continue her management through her specialist.  Hyperlipidemia Check LDL.  Continue Lipitor.  Right hip pain Likely related osteoarthritis.  This is not bothering her currently.  She will monitor and if it becomes more of a prevalent issue she will let us know.    Orders Placed This Encounter  Procedures  . Comp Met (CMET)  . Direct LDL  . TSH  . HgB A1c    No orders of the defined types were placed in this encounter.   This visit occurred during the SARS-CoV-2 public health emergency.  Safety protocols were in place, including screening questions prior to the visit, additional usage of staff PPE, and extensive cleaning of exam room while observing appropriate contact time as indicated for disinfecting solutions.    [name], MD [place]

## 2019-11-25 NOTE — Assessment & Plan Note (Signed)
Check TSH.  Continue Synthroid. 

## 2019-11-25 NOTE — Assessment & Plan Note (Signed)
Likely related osteoarthritis.  This is not bothering her currently.  She will monitor and if it becomes more of a prevalent issue she will let us know.

## 2019-11-25 NOTE — Assessment & Plan Note (Signed)
Check LDL.  Continue Lipitor. 

## 2019-11-25 NOTE — Patient Instructions (Signed)
Nice to see you. Get lab work today and contact you with the results.

## 2019-11-26 ENCOUNTER — Ambulatory Visit
Admission: RE | Admit: 2019-11-26 | Discharge: 2019-11-26 | Disposition: A | Discharge status: Home / Self Care | Payer: BC Managed Care – PPO | Source: Ambulatory Visit | Attending: Radiation Oncology | Admitting: Radiation Oncology

## 2019-11-26 ENCOUNTER — Ambulatory Visit: Payer: BC Managed Care – PPO | Admitting: Family Medicine

## 2019-11-26 ENCOUNTER — Ambulatory Visit: Payer: BC Managed Care – PPO

## 2019-11-26 ENCOUNTER — Other Ambulatory Visit: Payer: Self-pay

## 2019-11-26 DIAGNOSIS — Z170 Estrogen receptor positive status [ER+]: Secondary | ICD-10-CM | POA: Diagnosis not present

## 2019-11-26 DIAGNOSIS — C50911 Malignant neoplasm of unspecified site of right female breast: Secondary | ICD-10-CM | POA: Diagnosis not present

## 2019-11-26 DIAGNOSIS — C50411 Malignant neoplasm of upper-outer quadrant of right female breast: Secondary | ICD-10-CM | POA: Diagnosis not present

## 2019-11-26 DIAGNOSIS — Z510 Encounter for antineoplastic radiation therapy: Secondary | ICD-10-CM | POA: Diagnosis not present

## 2019-11-27 ENCOUNTER — Other Ambulatory Visit: Payer: Self-pay | Admitting: Family Medicine

## 2019-11-27 ENCOUNTER — Other Ambulatory Visit: Payer: Self-pay

## 2019-11-27 ENCOUNTER — Ambulatory Visit
Admission: RE | Admit: 2019-11-27 | Discharge: 2019-11-27 | Disposition: A | Discharge status: Home / Self Care | Payer: BC Managed Care – PPO | Source: Ambulatory Visit | Attending: Radiation Oncology | Admitting: Radiation Oncology

## 2019-11-27 DIAGNOSIS — C50411 Malignant neoplasm of upper-outer quadrant of right female breast: Secondary | ICD-10-CM | POA: Diagnosis not present

## 2019-11-27 DIAGNOSIS — Z510 Encounter for antineoplastic radiation therapy: Secondary | ICD-10-CM | POA: Diagnosis not present

## 2019-11-27 DIAGNOSIS — E876 Hypokalemia: Secondary | ICD-10-CM

## 2019-11-27 DIAGNOSIS — Z170 Estrogen receptor positive status [ER+]: Secondary | ICD-10-CM | POA: Diagnosis not present

## 2019-11-27 DIAGNOSIS — C50911 Malignant neoplasm of unspecified site of right female breast: Secondary | ICD-10-CM | POA: Diagnosis not present

## 2019-11-27 MED ORDER — POTASSIUM CHLORIDE CRYS ER 20 MEQ PO TBCR: 40.0000 meq | EXTENDED_RELEASE_TABLET | Freq: Every day | ORAL | 0 refills | Status: DC

## 2019-12-01 ENCOUNTER — Ambulatory Visit
Admission: RE | Admit: 2019-12-01 | Discharge: 2019-12-01 | Disposition: A | Discharge status: Home / Self Care | Payer: BC Managed Care – PPO | Source: Ambulatory Visit | Attending: Radiation Oncology | Admitting: Radiation Oncology

## 2019-12-01 ENCOUNTER — Other Ambulatory Visit: Payer: Self-pay

## 2019-12-01 DIAGNOSIS — Z170 Estrogen receptor positive status [ER+]: Secondary | ICD-10-CM | POA: Diagnosis not present

## 2019-12-01 DIAGNOSIS — C50411 Malignant neoplasm of upper-outer quadrant of right female breast: Secondary | ICD-10-CM | POA: Diagnosis not present

## 2019-12-01 DIAGNOSIS — Z510 Encounter for antineoplastic radiation therapy: Secondary | ICD-10-CM | POA: Diagnosis not present

## 2019-12-01 DIAGNOSIS — C50911 Malignant neoplasm of unspecified site of right female breast: Secondary | ICD-10-CM | POA: Diagnosis not present

## 2019-12-02 ENCOUNTER — Other Ambulatory Visit: Payer: Self-pay

## 2019-12-02 ENCOUNTER — Ambulatory Visit
Admission: RE | Admit: 2019-12-02 | Discharge: 2019-12-02 | Disposition: A | Discharge status: Home / Self Care | Payer: BC Managed Care – PPO | Source: Ambulatory Visit | Attending: Radiation Oncology | Admitting: Radiation Oncology

## 2019-12-02 DIAGNOSIS — C50911 Malignant neoplasm of unspecified site of right female breast: Secondary | ICD-10-CM | POA: Diagnosis not present

## 2019-12-02 DIAGNOSIS — C50411 Malignant neoplasm of upper-outer quadrant of right female breast: Secondary | ICD-10-CM | POA: Diagnosis not present

## 2019-12-02 DIAGNOSIS — Z170 Estrogen receptor positive status [ER+]: Secondary | ICD-10-CM | POA: Diagnosis not present

## 2019-12-02 DIAGNOSIS — Z510 Encounter for antineoplastic radiation therapy: Secondary | ICD-10-CM | POA: Diagnosis not present

## 2019-12-03 ENCOUNTER — Other Ambulatory Visit: Payer: Self-pay

## 2019-12-03 ENCOUNTER — Ambulatory Visit
Admission: RE | Admit: 2019-12-03 | Discharge: 2019-12-03 | Disposition: A | Discharge status: Home / Self Care | Payer: BC Managed Care – PPO | Source: Ambulatory Visit | Attending: Radiation Oncology | Admitting: Radiation Oncology

## 2019-12-03 DIAGNOSIS — Z170 Estrogen receptor positive status [ER+]: Secondary | ICD-10-CM | POA: Diagnosis not present

## 2019-12-03 DIAGNOSIS — Z510 Encounter for antineoplastic radiation therapy: Secondary | ICD-10-CM | POA: Diagnosis not present

## 2019-12-03 DIAGNOSIS — C50911 Malignant neoplasm of unspecified site of right female breast: Secondary | ICD-10-CM | POA: Diagnosis not present

## 2019-12-03 DIAGNOSIS — C50411 Malignant neoplasm of upper-outer quadrant of right female breast: Secondary | ICD-10-CM | POA: Diagnosis not present

## 2019-12-04 ENCOUNTER — Ambulatory Visit: Payer: BC Managed Care – PPO

## 2019-12-04 ENCOUNTER — Other Ambulatory Visit: Payer: Self-pay

## 2019-12-04 ENCOUNTER — Ambulatory Visit
Admission: RE | Admit: 2019-12-04 | Discharge: 2019-12-04 | Disposition: A | Discharge status: Home / Self Care | Payer: BC Managed Care – PPO | Source: Ambulatory Visit | Attending: Radiation Oncology | Admitting: Radiation Oncology

## 2019-12-04 DIAGNOSIS — C50411 Malignant neoplasm of upper-outer quadrant of right female breast: Secondary | ICD-10-CM | POA: Diagnosis not present

## 2019-12-04 DIAGNOSIS — Z170 Estrogen receptor positive status [ER+]: Secondary | ICD-10-CM | POA: Diagnosis not present

## 2019-12-04 DIAGNOSIS — Z510 Encounter for antineoplastic radiation therapy: Secondary | ICD-10-CM | POA: Diagnosis not present

## 2019-12-04 DIAGNOSIS — C50911 Malignant neoplasm of unspecified site of right female breast: Secondary | ICD-10-CM | POA: Diagnosis not present

## 2019-12-08 ENCOUNTER — Encounter: Payer: Self-pay | Admitting: Nurse Practitioner

## 2019-12-08 ENCOUNTER — Other Ambulatory Visit: Payer: Self-pay

## 2019-12-08 ENCOUNTER — Ambulatory Visit
Admission: RE | Admit: 2019-12-08 | Discharge: 2019-12-08 | Disposition: A | Discharge status: Home / Self Care | Payer: BC Managed Care – PPO | Source: Ambulatory Visit | Attending: Radiation Oncology | Admitting: Radiation Oncology

## 2019-12-08 DIAGNOSIS — Z510 Encounter for antineoplastic radiation therapy: Secondary | ICD-10-CM | POA: Diagnosis not present

## 2019-12-08 DIAGNOSIS — C50911 Malignant neoplasm of unspecified site of right female breast: Secondary | ICD-10-CM | POA: Diagnosis not present

## 2019-12-08 DIAGNOSIS — Z170 Estrogen receptor positive status [ER+]: Secondary | ICD-10-CM | POA: Diagnosis not present

## 2019-12-08 DIAGNOSIS — C50411 Malignant neoplasm of upper-outer quadrant of right female breast: Secondary | ICD-10-CM | POA: Diagnosis not present

## 2019-12-09 ENCOUNTER — Ambulatory Visit (INDEPENDENT_AMBULATORY_CARE_PROVIDER_SITE_OTHER): Payer: BC Managed Care – PPO | Admitting: Family Medicine

## 2019-12-09 ENCOUNTER — Ambulatory Visit
Admission: RE | Admit: 2019-12-09 | Discharge: 2019-12-09 | Disposition: A | Discharge status: Home / Self Care | Payer: BC Managed Care – PPO | Source: Ambulatory Visit | Attending: Radiation Oncology | Admitting: Radiation Oncology

## 2019-12-09 ENCOUNTER — Encounter: Payer: Self-pay | Admitting: Family Medicine

## 2019-12-09 ENCOUNTER — Other Ambulatory Visit: Payer: Self-pay

## 2019-12-09 DIAGNOSIS — C50411 Malignant neoplasm of upper-outer quadrant of right female breast: Secondary | ICD-10-CM | POA: Diagnosis not present

## 2019-12-09 DIAGNOSIS — Z170 Estrogen receptor positive status [ER+]: Secondary | ICD-10-CM

## 2019-12-09 DIAGNOSIS — E785 Hyperlipidemia, unspecified: Secondary | ICD-10-CM

## 2019-12-09 DIAGNOSIS — C50911 Malignant neoplasm of unspecified site of right female breast: Secondary | ICD-10-CM | POA: Diagnosis not present

## 2019-12-09 DIAGNOSIS — R7303 Prediabetes: Secondary | ICD-10-CM | POA: Diagnosis not present

## 2019-12-09 DIAGNOSIS — E876 Hypokalemia: Secondary | ICD-10-CM | POA: Diagnosis not present

## 2019-12-09 DIAGNOSIS — Z510 Encounter for antineoplastic radiation therapy: Secondary | ICD-10-CM | POA: Diagnosis not present

## 2019-12-09 NOTE — Progress Notes (Signed)
Virtual Visit via video Note  This visit type was conducted due to national recommendations for restrictions regarding the COVID-19 pandemic (e.g. social distancing).  This format is felt to be most appropriate for this patient at this time.  All issues noted in this document were discussed and addressed.  No physical exam was performed (except for noted visual exam findings with Video Visits).   I connected with Bailey Brooks today at  8:30 AM EST by a video enabled telemedicine application and verified that I am speaking with the correct person using two identifiers. Location patient: work Location provider: work Persons participating in the virtual visit: patient, provider  I discussed the limitations, risks, security and privacy concerns of performing an evaluation and management service by telephone and the availability of in person appointments. I also discussed with the patient that there may be a patient responsible charge related to this service. The patient expressed understanding and agreed to proceed.  Reason for visit: follow-up  HPI: Hyperlipidemia: Taking Lipitor.  Recent LDL slightly above goal.  She has chronic myalgias and arthralgias and is hesitant to increase the dose particularly given that she is going to go on hormonal therapy for her breast cancer.  No chest pain or shortness of breath.  Prediabetes: Not exercising.  Eating pretty much anything.  Hypokalemia: She did take the potassium supplement.  She is on chlorthalidone.  BPs have been [id number] systolically.  Breast cancer: She is currently undergoing radiation.  She does have some fatigue related to this.  She also has some itching under her armpit with this though no rash.  Has been using Aquaphor.  She does see her radiation oncologist later today.   ROS: See pertinent positives and negatives per HPI.  Past Medical History:  Diagnosis Date  . 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 Allergic rhinitis   . Anemia    WHILE GOING THRU CHEMO  . Arthritis   . CHF (congestive heart failure) ([place])    PT DENIES  . Diastolic dysfunction    a. 11/2016 Echo: EF 60-65%, no rwma, mild LVH, Gr1 DD, triv MR, mildly dil LA, nl RV fxn.  . 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 Madelynn Done s/p RCHOP and methotrexate, c/b renal failure  . Diverticulosis   . Esophagitis   . GERD (gastroesophageal reflux disease)   . H/O stem cell transplant ([place]) 06/2011   a. in setting of lymphoma.  . Hemorrhoids   . History of chemotherapy 22-Oct-2010   RHCOP/methotrexate-intrathecal  . History of stress test    a. 05/2010 Myoview: nl EF, no ischemia/infarct.  . Hypercholesterolemia   . Hypertension   . Hypothyroidism   . Morbid obesity ([place])   . 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 Paget disease, extra mammary    vulva, s/p resection 2014, Dr. Sabra Heck  . Pagets disease, extra-mammary   . TIA (transient ischemic attack)    X 2 -LAST ONE IN 2017    Past Surgical History:  Procedure Laterality Date  . ABDOMINAL HYSTERECTOMY  1985   Hysterectomy-partial  . BREAST BIOPSY  2002   Neg - AT [facility]  . BURR HOLE W/ PLACEMENT OMMAYA RESERVOIR    . BURR HOLE W/ PLACEMENT OMMAYA RESERVOIR  2012  . COLONOSCOPY  04/2013  . ESOPHAGOGASTRODUODENOSCOPY  04/2013  . INJECTION KNEE  07/02/2018  . INSERTION  CENTRAL VENOUS ACCESS DEVICE W/ SUBCUTANEOUS PORT  2011   Port a Cath: Right chest Double Lumen, 04-Nov-2010  . LIMBAL STEM CELL TRANSPLANT  2012  . LIVER BIOPSY     stage 4B large Bcell lymphoma  . PARTIAL MASTECTOMY WITH NEEDLE LOCALIZATION AND AXILLARY SENTINEL LYMPH NODE BX Right 10/16/2019   Procedure: PARTIAL MASTECTOMY WITH NEEDLE LOCALIZATION AND AXILLARY SENTINEL LYMPH NODE BX;  Surgeon: Benjamine Sprague, DO;  Location: [facility] ORS;  Service: General;  Laterality: Right;  . ROTATOR CUFF REPAIR Right 2016  . SHOULDER  ARTHROSCOPY Right 06/22/2015   Procedure: ARTHROSCOPY SHOULDER, parital repair of rotator cuff, biceps tenodesis, decompression and debridement;  Surgeon: Corky Mull, MD;  Location: [facility] ORS;  Service: Orthopedics;  Laterality: Right;  . TONSILLECTOMY  1954  . VULVECTOMY    . VULVECTOMY PARTIAL N/A 09/29/2015   Procedure: VULVECTOMY PARTIAL;  Surgeon: Gillis Ends, MD;  Location: [facility] ORS;  Service: Gynecology;  Laterality: N/A;    Family History  Problem Relation Age of Onset  . Diabetes Mother        died @ 51 of MI.  Marland Kitchen Hypertension Mother   . Heart attack Mother   . Melanoma Father 20       died of complications r/t melanoma w/ lung mets.  . Kidney disease Sister        Kidney removed   . Prostate cancer Brother        Prostate - dx in 12's  . Heart disease Brother 70       reported MI @ age 65, ? treated w/ TPA->no recurrent CAD, now in 73's.  Marland Kitchen Hearing loss Maternal Aunt   . Hearing loss Maternal Grandfather     SOCIAL HX: Non-smoker   Current Outpatient Medications:  .  aspirin EC 81 MG tablet, Take 81 mg by mouth daily., Disp: , Rfl:  .  atorvastatin (LIPITOR) 80 MG tablet, Take 1 tablet (80 mg total) by mouth daily at 6 PM., Disp: 90 tablet, Rfl: 1 .  chlorthalidone (HYGROTON) 25 MG tablet, Take 0.5 tablets (12.5 mg total) by mouth daily., Disp: 45 tablet, Rfl: 1 .  cholecalciferol (VITAMIN D3) 25 MCG (1000 UT) tablet, Take 1,000 Units by mouth daily., Disp: , Rfl:  .  clobetasol cream (TEMOVATE) [id number] %, Apply 1 application topically daily as needed (psoriasis). , Disp: , Rfl:  .  ibuprofen (ADVIL) 800 MG tablet, Take 1 tablet (800 mg total) by mouth every 8 (eight) hours as needed for mild pain or moderate pain., Disp: 30 tablet, Rfl: 0 .  lansoprazole (PREVACID) 30 MG capsule, TAKE 1 CAPSULE(30 MG) BY MOUTH DAILY (Patient taking differently: Take 30 mg by mouth every morning. ), Disp: 90 capsule, Rfl: 1 .  levothyroxine (SYNTHROID) 88 MCG tablet, take 1  tablet by mouth every morning ON AN EMPTY STOMACH (Patient taking differently: Take 88 mcg by mouth daily before breakfast. ), Disp: 90 tablet, Rfl: 3 .  lisinopril (ZESTRIL) 10 MG tablet, Take 1 tablet (10 mg total) by mouth daily. (Patient taking differently: Take 10 mg by mouth every morning. ), Disp: 90 tablet, Rfl: 3 .  mometasone (ELOCON) 0.1 % cream, Apply 1 application topically daily as needed (eczema). , Disp: , Rfl: 0 .  Multiple Vitamin (MULTIVITAMIN) tablet, Take 1 tablet by mouth daily., Disp: , Rfl:  .  potassium chloride SA (KLOR-CON) 20 MEQ tablet, Take 2 tablets (40 mEq total) by mouth daily., Disp: 4 tablet, Rfl: 0 .  Specialty Vitamins Products (ICAPS LUTEIN & ZEAXANTHIN PO), Take by mouth every morning. Zeaxanthin 4mg /Lutein 10mg  , Disp: , Rfl:   EXAM:  VITALS per patient if applicable:  GENERAL: alert, oriented, appears well and in no acute distress  HEENT: atraumatic, conjunttiva clear, no obvious abnormalities on inspection of external nose and ears  NECK: normal movements of the head and neck  LUNGS: on inspection no signs of respiratory distress, breathing rate appears normal, no obvious gross SOB, gasping or wheezing  CV: no obvious cyanosis  MS: moves all visible extremities without noticeable abnormality  PSYCH/NEURO: pleasant and cooperative, no obvious depression or anxiety, speech and thought processing grossly intact  ASSESSMENT AND PLAN:  Discussed the following assessment and plan:  Carcinoma of upper-outer quadrant of right breast in female, estrogen receptor positive (HCC) I discussed that fatigue is quite common with radiation therapy.  She will continue to monitor that.  She is going to discuss the focal itching with her radiation oncologist to get their input.  Hyperlipidemia We are going to increase her Lipitor to 80 mg and see if she can tolerate this.  If it is tolerable she will continue on this dose.  If not tolerable she will let us know.   She needs lab work in 6 weeks and that will be scheduled.  Prediabetes Discussed dietary changes and exercise.  Hypokalemia Recheck potassium.  Discussed the potential for discontinuing chlorthalidone.   Orders Placed This Encounter  Procedures  . Lipid panel    Standing Status:   Future    Standing Expiration Date:   12/11/2020  . Hepatic function panel    Standing Status:   Future    Standing Expiration Date:   12/11/2020    Meds ordered this encounter  Medications  . atorvastatin (LIPITOR) 80 MG tablet    Sig: Take 1 tablet (80 mg total) by mouth daily at 6 PM.    Dispense:  90 tablet    Refill:  1     I discussed the assessment and treatment plan with the patient. The patient was provided an opportunity to ask questions and all were answered. The patient agreed with the plan and demonstrated an understanding of the instructions.   The patient was advised to call back or seek an in-person evaluation if the symptoms worsen or if the condition fails to improve as anticipated.    [name], MD

## 2019-12-10 ENCOUNTER — Ambulatory Visit
Admission: RE | Admit: 2019-12-10 | Discharge: 2019-12-10 | Disposition: A | Discharge status: Home / Self Care | Payer: BC Managed Care – PPO | Source: Ambulatory Visit | Attending: Radiation Oncology | Admitting: Radiation Oncology

## 2019-12-10 ENCOUNTER — Inpatient Hospital Stay: Payer: BC Managed Care – PPO

## 2019-12-10 ENCOUNTER — Other Ambulatory Visit: Payer: Self-pay

## 2019-12-10 DIAGNOSIS — C50411 Malignant neoplasm of upper-outer quadrant of right female breast: Secondary | ICD-10-CM | POA: Diagnosis not present

## 2019-12-10 DIAGNOSIS — Z510 Encounter for antineoplastic radiation therapy: Secondary | ICD-10-CM | POA: Diagnosis not present

## 2019-12-10 DIAGNOSIS — Z170 Estrogen receptor positive status [ER+]: Secondary | ICD-10-CM | POA: Diagnosis not present

## 2019-12-10 DIAGNOSIS — C50911 Malignant neoplasm of unspecified site of right female breast: Secondary | ICD-10-CM | POA: Diagnosis not present

## 2019-12-11 ENCOUNTER — Ambulatory Visit
Admission: RE | Admit: 2019-12-11 | Discharge: 2019-12-11 | Disposition: A | Discharge status: Home / Self Care | Payer: BC Managed Care – PPO | Source: Ambulatory Visit | Attending: Radiation Oncology | Admitting: Radiation Oncology

## 2019-12-11 ENCOUNTER — Other Ambulatory Visit: Payer: Self-pay

## 2019-12-11 DIAGNOSIS — C50911 Malignant neoplasm of unspecified site of right female breast: Secondary | ICD-10-CM | POA: Diagnosis not present

## 2019-12-11 DIAGNOSIS — Z510 Encounter for antineoplastic radiation therapy: Secondary | ICD-10-CM | POA: Diagnosis not present

## 2019-12-11 DIAGNOSIS — Z170 Estrogen receptor positive status [ER+]: Secondary | ICD-10-CM | POA: Diagnosis not present

## 2019-12-11 DIAGNOSIS — C50411 Malignant neoplasm of upper-outer quadrant of right female breast: Secondary | ICD-10-CM | POA: Diagnosis not present

## 2019-12-12 ENCOUNTER — Ambulatory Visit
Admission: RE | Admit: 2019-12-12 | Discharge: 2019-12-12 | Disposition: A | Discharge status: Home / Self Care | Payer: BC Managed Care – PPO | Source: Ambulatory Visit | Attending: Radiation Oncology | Admitting: Radiation Oncology

## 2019-12-12 ENCOUNTER — Other Ambulatory Visit (INDEPENDENT_AMBULATORY_CARE_PROVIDER_SITE_OTHER): Payer: BC Managed Care – PPO

## 2019-12-12 ENCOUNTER — Other Ambulatory Visit: Payer: Self-pay

## 2019-12-12 ENCOUNTER — Telehealth: Payer: Self-pay | Admitting: Family Medicine

## 2019-12-12 DIAGNOSIS — C50911 Malignant neoplasm of unspecified site of right female breast: Secondary | ICD-10-CM | POA: Diagnosis not present

## 2019-12-12 DIAGNOSIS — E876 Hypokalemia: Secondary | ICD-10-CM

## 2019-12-12 DIAGNOSIS — Z170 Estrogen receptor positive status [ER+]: Secondary | ICD-10-CM | POA: Diagnosis not present

## 2019-12-12 DIAGNOSIS — Z510 Encounter for antineoplastic radiation therapy: Secondary | ICD-10-CM | POA: Diagnosis not present

## 2019-12-12 LAB — POTASSIUM: Potassium: 3.6 mEq/L (ref 3.5–5.1)

## 2019-12-12 MED ORDER — ATORVASTATIN CALCIUM 80 MG PO TABS: 80.0000 mg | ORAL_TABLET | Freq: Every day | ORAL | 1 refills | Status: DC

## 2019-12-12 NOTE — Assessment & Plan Note (Signed)
Discussed dietary changes and exercise. 

## 2019-12-12 NOTE — Assessment & Plan Note (Signed)
We are going to increase her Lipitor to 80 mg and see if she can tolerate this.  If it is tolerable she will continue on this dose.  If not tolerable she will let us know.  She needs lab work in 6 weeks and that will be scheduled.

## 2019-12-12 NOTE — Assessment & Plan Note (Signed)
Recheck potassium.  Discussed the potential for discontinuing chlorthalidone.

## 2019-12-12 NOTE — Assessment & Plan Note (Signed)
I discussed that fatigue is quite common with radiation therapy.  She will continue to monitor that.  She is going to discuss the focal itching with her radiation oncologist to get their input.

## 2019-12-12 NOTE — Telephone Encounter (Addendum)
I forgot to put this patient's follow-up in her recent visit.  Can you get her scheduled for lab work in about 6 weeks?  Thanks.

## 2019-12-14 ENCOUNTER — Other Ambulatory Visit: Payer: Self-pay | Admitting: Family Medicine

## 2019-12-14 DIAGNOSIS — I10 Essential (primary) hypertension: Secondary | ICD-10-CM

## 2019-12-15 ENCOUNTER — Other Ambulatory Visit: Payer: Self-pay

## 2019-12-15 ENCOUNTER — Ambulatory Visit
Admission: RE | Admit: 2019-12-15 | Discharge: 2019-12-15 | Disposition: A | Discharge status: Home / Self Care | Payer: BC Managed Care – PPO | Source: Ambulatory Visit | Attending: Radiation Oncology | Admitting: Radiation Oncology

## 2019-12-15 DIAGNOSIS — Z510 Encounter for antineoplastic radiation therapy: Secondary | ICD-10-CM | POA: Diagnosis not present

## 2019-12-15 DIAGNOSIS — Z170 Estrogen receptor positive status [ER+]: Secondary | ICD-10-CM | POA: Diagnosis not present

## 2019-12-15 DIAGNOSIS — C50911 Malignant neoplasm of unspecified site of right female breast: Secondary | ICD-10-CM | POA: Diagnosis not present

## 2019-12-16 ENCOUNTER — Other Ambulatory Visit: Payer: Self-pay

## 2019-12-16 ENCOUNTER — Ambulatory Visit
Admission: RE | Admit: 2019-12-16 | Discharge: 2019-12-16 | Disposition: A | Discharge status: Home / Self Care | Payer: BC Managed Care – PPO | Source: Ambulatory Visit | Attending: Radiation Oncology | Admitting: Radiation Oncology

## 2019-12-16 DIAGNOSIS — Z510 Encounter for antineoplastic radiation therapy: Secondary | ICD-10-CM | POA: Diagnosis not present

## 2019-12-16 DIAGNOSIS — Z170 Estrogen receptor positive status [ER+]: Secondary | ICD-10-CM | POA: Diagnosis not present

## 2019-12-16 DIAGNOSIS — C50911 Malignant neoplasm of unspecified site of right female breast: Secondary | ICD-10-CM | POA: Diagnosis not present

## 2019-12-17 ENCOUNTER — Other Ambulatory Visit: Payer: Self-pay

## 2019-12-17 ENCOUNTER — Ambulatory Visit
Admission: RE | Admit: 2019-12-17 | Discharge: 2019-12-17 | Disposition: A | Discharge status: Home / Self Care | Payer: BC Managed Care – PPO | Source: Ambulatory Visit | Attending: Radiation Oncology | Admitting: Radiation Oncology

## 2019-12-17 DIAGNOSIS — Z510 Encounter for antineoplastic radiation therapy: Secondary | ICD-10-CM | POA: Diagnosis not present

## 2019-12-17 DIAGNOSIS — Z170 Estrogen receptor positive status [ER+]: Secondary | ICD-10-CM | POA: Diagnosis not present

## 2019-12-17 DIAGNOSIS — C50911 Malignant neoplasm of unspecified site of right female breast: Secondary | ICD-10-CM | POA: Diagnosis not present

## 2019-12-18 ENCOUNTER — Ambulatory Visit
Admission: RE | Admit: 2019-12-18 | Discharge: 2019-12-18 | Disposition: A | Discharge status: Home / Self Care | Payer: BC Managed Care – PPO | Source: Ambulatory Visit | Attending: Radiation Oncology | Admitting: Radiation Oncology

## 2019-12-18 ENCOUNTER — Other Ambulatory Visit: Payer: Self-pay

## 2019-12-18 DIAGNOSIS — C50411 Malignant neoplasm of upper-outer quadrant of right female breast: Secondary | ICD-10-CM | POA: Diagnosis not present

## 2019-12-18 DIAGNOSIS — C50911 Malignant neoplasm of unspecified site of right female breast: Secondary | ICD-10-CM | POA: Diagnosis not present

## 2019-12-18 DIAGNOSIS — Z510 Encounter for antineoplastic radiation therapy: Secondary | ICD-10-CM | POA: Diagnosis not present

## 2019-12-18 DIAGNOSIS — Z170 Estrogen receptor positive status [ER+]: Secondary | ICD-10-CM | POA: Diagnosis not present

## 2019-12-19 ENCOUNTER — Other Ambulatory Visit: Payer: Self-pay

## 2019-12-19 ENCOUNTER — Ambulatory Visit
Admission: RE | Admit: 2019-12-19 | Discharge: 2019-12-19 | Disposition: A | Discharge status: Home / Self Care | Payer: BC Managed Care – PPO | Source: Ambulatory Visit | Attending: Radiation Oncology | Admitting: Radiation Oncology

## 2019-12-19 DIAGNOSIS — C50911 Malignant neoplasm of unspecified site of right female breast: Secondary | ICD-10-CM | POA: Diagnosis not present

## 2019-12-19 DIAGNOSIS — Z510 Encounter for antineoplastic radiation therapy: Secondary | ICD-10-CM | POA: Diagnosis not present

## 2019-12-19 DIAGNOSIS — Z170 Estrogen receptor positive status [ER+]: Secondary | ICD-10-CM | POA: Diagnosis not present

## 2019-12-22 ENCOUNTER — Other Ambulatory Visit: Payer: Self-pay

## 2019-12-22 ENCOUNTER — Ambulatory Visit
Admission: RE | Admit: 2019-12-22 | Discharge: 2019-12-22 | Disposition: A | Discharge status: Home / Self Care | Payer: BC Managed Care – PPO | Source: Ambulatory Visit | Attending: Radiation Oncology | Admitting: Radiation Oncology

## 2019-12-22 DIAGNOSIS — Z510 Encounter for antineoplastic radiation therapy: Secondary | ICD-10-CM | POA: Diagnosis not present

## 2019-12-22 DIAGNOSIS — C50911 Malignant neoplasm of unspecified site of right female breast: Secondary | ICD-10-CM | POA: Diagnosis not present

## 2019-12-22 DIAGNOSIS — Z170 Estrogen receptor positive status [ER+]: Secondary | ICD-10-CM | POA: Diagnosis not present

## 2019-12-23 ENCOUNTER — Other Ambulatory Visit: Payer: Self-pay

## 2019-12-23 ENCOUNTER — Ambulatory Visit
Admission: RE | Admit: 2019-12-23 | Discharge: 2019-12-23 | Disposition: A | Discharge status: Home / Self Care | Payer: BC Managed Care – PPO | Source: Ambulatory Visit | Attending: Radiation Oncology | Admitting: Radiation Oncology

## 2019-12-23 DIAGNOSIS — C50911 Malignant neoplasm of unspecified site of right female breast: Secondary | ICD-10-CM | POA: Diagnosis not present

## 2019-12-23 DIAGNOSIS — Z510 Encounter for antineoplastic radiation therapy: Secondary | ICD-10-CM | POA: Diagnosis not present

## 2019-12-23 DIAGNOSIS — Z170 Estrogen receptor positive status [ER+]: Secondary | ICD-10-CM | POA: Diagnosis not present

## 2019-12-24 ENCOUNTER — Ambulatory Visit
Admission: RE | Admit: 2019-12-24 | Discharge: 2019-12-24 | Disposition: A | Discharge status: Home / Self Care | Payer: BC Managed Care – PPO | Source: Ambulatory Visit | Attending: Radiation Oncology | Admitting: Radiation Oncology

## 2019-12-24 ENCOUNTER — Other Ambulatory Visit: Payer: Self-pay

## 2019-12-24 DIAGNOSIS — Z170 Estrogen receptor positive status [ER+]: Secondary | ICD-10-CM | POA: Diagnosis not present

## 2019-12-24 DIAGNOSIS — C50411 Malignant neoplasm of upper-outer quadrant of right female breast: Secondary | ICD-10-CM | POA: Diagnosis not present

## 2019-12-24 DIAGNOSIS — Z510 Encounter for antineoplastic radiation therapy: Secondary | ICD-10-CM | POA: Diagnosis not present

## 2019-12-24 DIAGNOSIS — C50911 Malignant neoplasm of unspecified site of right female breast: Secondary | ICD-10-CM | POA: Diagnosis not present

## 2019-12-30 ENCOUNTER — Inpatient Hospital Stay: Payer: BC Managed Care – PPO | Attending: Internal Medicine | Admitting: Internal Medicine

## 2019-12-30 DIAGNOSIS — Z170 Estrogen receptor positive status [ER+]: Secondary | ICD-10-CM | POA: Diagnosis not present

## 2019-12-30 DIAGNOSIS — C50411 Malignant neoplasm of upper-outer quadrant of right female breast: Secondary | ICD-10-CM | POA: Diagnosis not present

## 2019-12-30 DIAGNOSIS — Z13820 Encounter for screening for osteoporosis: Secondary | ICD-10-CM

## 2019-12-30 MED ORDER — ANASTROZOLE 1 MG PO TABS: 1.0000 mg | ORAL_TABLET | Freq: Every day | ORAL | 6 refills | Status: DC

## 2019-12-30 NOTE — Progress Notes (Signed)
I connected with Bailey Brooks on 12/30/2019 at  2:30 PM EST by video enabled telemedicine visit and verified that I am speaking with the correct person using two identifiers.  I discussed the limitations, risks, security and privacy concerns of performing an evaluation and management service by telemedicine and the availability of in-person appointments. I also discussed with the patient that there may be a patient responsible charge related to this service. The patient expressed understanding and agreed to proceed.    Other persons participating in the visit and their role in the encounter: RN/medical reconciliation Patient's location: home Provider's location: office  Oncology History Overview Note  # RIGHT BREAST CANCER stage I [pTapN0; G-1; ER-100%; PR-99%; Her-2-NEG; Dr.Sakai]. 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   DIAGNOSIS: Right breast  STAGE:   I      ;  GOALS: cure  CURRENT/MOST RECENT THERAPY : anastrazole   Diffuse large b-cell lymphoma, intra-abdominal lymph nodes (HCC)  Carcinoma of upper-outer quadrant of right breast in female, estrogen receptor positive ([place])  10/28/2019 Initial Diagnosis   Carcinoma of upper-outer quadrant of right breast in female, estrogen receptor positive ([place])      Chief Complaint: breast cancer    History of present illness:Bailey Brooks 69 y.o.  female with history of stage I ER/PR positive HER-2 negative breast cancer status post lumpectomy-finished radiation approximately 1 day ago.  Patient complains of worsening fatigue.  Also complains of poor appetite.  No nausea no vomiting.  Complains of mild  radiation induced rash on the breast currently improved.  She has been using the hydrocortisone cream.  Otherwise denies any bone pain.  No fever chills or shortness of breath or cough.  Observation/objective:  Assessment and plan: Carcinoma of upper-outer quadrant of right breast in female, estrogen receptor positive ([place]) # Right breast cancer stage I ER/PRPos; HER-2 negative.  Patient status post radiation.  Finished radiation approximately 1 day ago.  #Recommend initiation of anastrozole; will plan to start at next visit/month from now./Prescription sent.  #Discussed the mechanism of action of aromatase inhibitors-with blocking of estrogen to prevent breast cancer.  Also discussed the potential side effects including but not limited to arthralgias hot flashes and increased risk of osteoporosis. Will get baseline BMD.   #Hypokalemia-on potassium supplementation followed by PCP.  #Fatigue poor appetite-suspect from radiation.  Hopefully should improve in the next 1 month or so.  Recommend continued increase in physical activity.  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  Stable.  No clinical recurrence.  # paget's disease of Vulva- status post recent Bx [Dr.Secord] neg for malignancy.  # DISPOSITION:call pt's cell phone # BMD in 2-3 weeks # in 4 weeks;MD  no labs- Dr.B  Cc; Dr.Sonnenberg.     Follow-up instructions:  I discussed the assessment and treatment plan with the patient.  The patient was provided an opportunity to ask questions and all were answered.  The patient agreed with the plan and demonstrated understanding of instructions.  The patient was advised to call back or seek an in person evaluation if the symptoms worsen or if the condition fails to improve as anticipated.  Dr.  Charlaine Dalton [facility] 01/01/2020 10:20 AM

## 2019-12-30 NOTE — Assessment & Plan Note (Addendum)
#  Right breast cancer stage I ER/PRPos; HER-2 negative.  Patient status post radiation.  Finished radiation approximately 1 day ago.  #Recommend initiation of anastrozole; will plan to start at next visit/month from now./Prescription sent.  #Discussed the mechanism of action of aromatase inhibitors-with blocking of estrogen to prevent breast cancer.  Also discussed the potential side effects including but not limited to arthralgias hot flashes and increased risk of osteoporosis. Will get baseline BMD.   #Hypokalemia-on potassium supplementation followed by PCP.  #Fatigue poor appetite-suspect from radiation.  Hopefully should improve in the next 1 month or so.  Recommend continued increase in physical activity.  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  Stable.  No clinical recurrence.  # paget's disease of Vulva- status post recent Bx [Dr.Secord] neg for malignancy.  # DISPOSITION:call pt's cell phone # BMD in 2-3 weeks # in 4 weeks;MD  no labs- Dr.B  Cc; Dr.Sonnenberg.

## 2020-01-06 ENCOUNTER — Other Ambulatory Visit: Payer: Self-pay

## 2020-01-06 NOTE — Progress Notes (Signed)
Patient pre screened for office appointment, no questions or concerns today. Patient reminded of upcoming appointment time and date. 

## 2020-01-07 ENCOUNTER — Inpatient Hospital Stay: Payer: BC Managed Care – PPO | Attending: Obstetrics and Gynecology | Admitting: Obstetrics and Gynecology

## 2020-01-07 ENCOUNTER — Other Ambulatory Visit: Payer: Self-pay

## 2020-01-07 VITALS — BP 146/79 | HR 79 | Temp 98.0°F | Resp 18 | Wt 242.0 lb

## 2020-01-07 DIAGNOSIS — Z8673 Personal history of transient ischemic attack (TIA), and cerebral infarction without residual deficits: Secondary | ICD-10-CM | POA: Insufficient documentation

## 2020-01-07 DIAGNOSIS — E039 Hypothyroidism, unspecified: Secondary | ICD-10-CM | POA: Insufficient documentation

## 2020-01-07 DIAGNOSIS — E7800 Pure hypercholesterolemia, unspecified: Secondary | ICD-10-CM | POA: Insufficient documentation

## 2020-01-07 DIAGNOSIS — I10 Essential (primary) hypertension: Secondary | ICD-10-CM | POA: Diagnosis not present

## 2020-01-07 DIAGNOSIS — G4710 Hypersomnia, unspecified: Secondary | ICD-10-CM | POA: Insufficient documentation

## 2020-01-07 DIAGNOSIS — E785 Hyperlipidemia, unspecified: Secondary | ICD-10-CM | POA: Diagnosis not present

## 2020-01-07 DIAGNOSIS — C50911 Malignant neoplasm of unspecified site of right female breast: Secondary | ICD-10-CM | POA: Insufficient documentation

## 2020-01-07 DIAGNOSIS — Z8572 Personal history of non-Hodgkin lymphomas: Secondary | ICD-10-CM | POA: Diagnosis not present

## 2020-01-07 DIAGNOSIS — H3530 Unspecified macular degeneration: Secondary | ICD-10-CM | POA: Diagnosis not present

## 2020-01-07 DIAGNOSIS — Z170 Estrogen receptor positive status [ER+]: Secondary | ICD-10-CM | POA: Diagnosis not present

## 2020-01-07 DIAGNOSIS — C4499 Other specified malignant neoplasm of skin, unspecified: Secondary | ICD-10-CM

## 2020-01-07 DIAGNOSIS — R600 Localized edema: Secondary | ICD-10-CM | POA: Insufficient documentation

## 2020-01-07 DIAGNOSIS — C519 Malignant neoplasm of vulva, unspecified: Secondary | ICD-10-CM | POA: Diagnosis not present

## 2020-01-07 DIAGNOSIS — K219 Gastro-esophageal reflux disease without esophagitis: Secondary | ICD-10-CM | POA: Insufficient documentation

## 2020-01-07 DIAGNOSIS — Z923 Personal history of irradiation: Secondary | ICD-10-CM | POA: Insufficient documentation

## 2020-01-07 MED ORDER — IMIQUIMOD 5 % EX CREA: TOPICAL_CREAM | CUTANEOUS | 0 refills | Status: DC

## 2020-01-07 NOTE — Progress Notes (Signed)
Pt in for follow up, RN called yesterday for pre assessment questions.  Pt reports no changes or concerns.

## 2020-01-07 NOTE — Patient Instructions (Signed)
Vulvar Aldara Cream Application  INSTRUCTIONS Apply the Aldara cream by applying thin layer topically to vulva at bedtime every other night at bedtime.  Leave on skin overnight then removed with soap and water in the morning.  Leave on skin for 6-10 hours.   To avoid the cream from getting on nontreatment sites, you can apply Desitin or Vaseline to nontreated skin surfaces to avoid contact. We recommend handwashing before and after cream application.  Avoid using excessive amounts of cream.  Aldara 5% cream was packaged and single-use packets which contain sufficient cream to cover the area.  Apply the cream externally only.  Apply a thin layer of cream and rub into the affected area until it is no longer visible.  The application site should not be covered. Local skin reactions including redness, swelling, discomfort, at the treatment site are common.  A rest period of several days may be taken if needed to allow the skin to rest.  Treatment may resume once the reaction subsides.     From UpToDate Adverse Reactions  Note: Frequency of reactions vary and are related to the degree of inflammation associated with the treated disease, number of weekly applications, product formulation, and individual sensitivity. >10%: Dermatologic: Localized erythema (58% to 100%; remote: 2%), xeroderma (local; including flaking, scaling; 18% to 93%; remote: 1%), crusted skin (local; 4% to 93%), skin sclerosis (local; 5% to 84%), dermal ulcer (local; 4% to 62%; remote: 2%), localized vesiculation (2% to 31%), excoriation (local; remote: 1%) Infection: Fungal infection (2% to 11%) Local: Localized edema (12% to 78%; remote: 1%), application site discharge (22% to 51%), local pruritus (3% to 32%), localized burning (9% to 26%) Respiratory: Upper respiratory tract infection (15% to 33%) 1% to 10%: Cardiovascular: Chest pain, localized blanching Central nervous system: Headache (2% to 6%), fatigue (1% to 4%), dizziness  (<1% to 3%), local discomfort (soreness; ?3%), rigors (1%), anxiety, pain, tingling of skin (local) Dermatologic: Skin pain (local; 1% to 8%), skin hypertrophy (local; 3%), skin infection (local; 1% to 3%), eczema (2%), cheilitis (?2%), alopecia (1%), dermal hemorrhage (local), localized rash, papule (local), seborrhoeic keratosis, skin tenderness (local), stinging of the skin (local), tinea (cruris) Endocrine & metabolic: Increased serum glucose Gastrointestinal: Nausea (1% to 4%), diarrhea (1% to 3%), anorexia (?3%), vomiting (1%), dyspepsia Genitourinary: Bacterial vaginosis (3%), urinary tract infection (1%) Hematologic & oncologic: Squamous cell carcinoma (4%), lymphadenopathy (2% to 3%) Infection: Herpes simplex (?3%) Local: Local irritation (3% to 6%) Neuromuscular & skeletal: Arthralgia (1% to 3%), myalgia (?1%), back pain Respiratory: Sinusitis (7%), flu-like symptoms (<1% to 4%), cough, pharyngitis, rhinitis Miscellaneous: Fever (?3%)  Local skin reactions (erythema) at the treatment site are common. A rest period of several days may be taken if required by the patient's discomfort or severity of the local skin reaction. Treatment may resume once the reaction subsides. Non-occlusive dressings such as cotton gauze or cotton underwear may be used in the management of skin reactions. The technique for proper dose administration should be demonstrated by the prescriber to maximize the benefit of Aldara therapy. Handwashing before and after cream application is recommended. Aldara 5% cream is packaged in single-use packets which contain sufficient cream to cover the area of up to 20 cm2; use of excessive amounts of cream should be avoided. Apply Aldara cream to external area. A thin layer is applied to the wart area and rubbed in until the cream is no longer visible. The application site should not be covered.

## 2020-01-07 NOTE — Progress Notes (Signed)
Gynecologic Oncology Interval Visit   Referring Provider: Dr. Kenton Kingfisher  Chief Concern: Recurrent paget's disease of the vulva  Subjective:  Bailey Brooks is a 69 y.o. female, diagnosed with Paget's of the vulva, s/p WLE and vulvar biopsy 09/29/2015, who returns to clinic to discussion of starting imiquimod.   In the interim she has completed surgery and radiation for stage I right breast cancer, ER/PR positive HER-2/neu negative. She completed radiation on 12/29/2019. Anastrozole was recommended and she plans to start in approximately late February.   At last visit we had recommended start Aldara but due to recent diagnosis of breast cancer at that time, patient requested to wait to start treatment and returns today to discuss options for management and consider starting Aldara. She is concerned about reaching site to be treated and tolerability. Her daughter-in-law is going to help her with application. Previously she had expressed desire to avoid surgery/Mohs. She continues to deny vulvar itching, discomfort, or discharge.   Previous biopsies were consistent with recurrent Paget's.   DIAGNOSIS:  A. VULVA, LEFT AT 3:00; PUNCH BIOPSY:  - BENIGN VULVAR TISSUE.  - NEGATIVE FOR DYSPLASIA AND MALIGNANCY.  DIAGNOSIS:  A. VULVA, RIGHT AT 7:00; PUNCH BIOPSY:  - EXTRAMAMMARY PAGET'S DISEASE OF THE VULVA.  - NEGATIVE FOR DYSPLASIA AND MALIGNANCY.     Gynecologic Oncology History: Bailey Brooks has a history of localized vulvar Paget's disease.   04/2013- vulvar biopsy revealed Paget's disease               WLE, additional margins resected for positive disease on frozen evaluation.   Part A: VULVA, VAGINAL MARGIN:  - POSITIVE FOR EXTRAMAMMARY PAGET'S DISEASE  Part B: VULVA, POSTERIOR MARGIN:  - POSITIVE FOR EXTRAMAMMARY PAGET'S DISEASE  Part C: VULVA, RIGHT, PARTIAL VULVECTOMY:  - EXTRAMAMMARY PAGET'S DISEASE  Part D: VULVA, NEW VAGINAL MARGIN:  - POSITIVE FOR EXTRAMAMMARY PAGET'S  DISEASE  07/28/2015 for routine surveillance an area on the right vulva seemed more suspicious for recurrence. This was biopsied and confirmed recurrent Paget's disease.   She underwent repeat excision 09/29/15 A. VULVA; EXCISION:  - RARE CYTOKERATIN 7 POSITIVE INTRAEPIDERMAL CELLS, INTERPRETED AS RESIDUAL PAGET DISEASE.  - THE SURGICAL MARGINS ARE CLEAR.   B. VULVA, 11:00; BIOPSY:  - RARE CYTOKERATIN 7 POSITIVE INTRAEPIDERMAL CELLS, INTERPRETED AS PAGET DISEASE.   No additional issues since surgery.   She had vulvar biopsy on 05/09/17 which was negative:   DIAGNOSIS:  A. VULVA; BIOPSY:  - SKIN WITH FOCAL KERATOSIS AND HYPERGRANULOSIS - NEGATIVE FOR DYSPLASIA AND MALIGNANCY  Of note, she has had a h/o recurrent vulvar candidiasis. Prior to her vulvar surgery 09/2015 she received 5 weekly doses of oral diflucan for vulvar candidiasis. Postop she was treated for yeast infection again in 12/17 and then took one Diflucan a week for six months with good results.   Seen in gyn-onc clinic on 01/16/18 by Dr. Theora Gianotti. NED at that time.   She has swelling of her lower extremities which was evaluated by her PCP and thought to be related to amlodipine. She started lasix which has improved her symptoms. She was seen by Dr. 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   She saw Dr. Theora Gianotti on 01/29/2019 with symptoms concerning for candidiasis at that time but no evidence of recurrent disease.   Her blood pressure medications were recently changes due to swelling secondary to amlodipine. No on lisinopril.    Problem List:  Patient Active Problem List   Diagnosis Date Noted  . Carcinoma of upper-outer quadrant of right breast in female, estrogen receptor positive ([place]) 10/28/2019  . Stress 08/06/2019  . Breast cancer screening 08/06/2019  . Pain due to onychomycosis of toenails of both feet 06/09/2019  . Prediabetes 12/20/2018  . Macular  degeneration 06/20/2018  . GERD (gastroesophageal reflux disease) 06/20/2018  . Hypersomnia 06/20/2018  . Chronic pain of right knee 06/20/2018  . Bruising 12/14/2017  . Diffuse large B cell lymphoma ([place]) 06/11/2017  . Diffuse large b-cell lymphoma, intra-abdominal lymph nodes ([place]) 06/11/2017  . Vulvar lesion 05/16/2017  . Low back pain 04/03/2017  . History of transient ischemic attack (TIA) 02/10/2017  . Hypokalemia 02/10/2017  . Exertional shortness of breath 11/06/2016  . Right hip pain 08/03/2016  . Essential hypertension 07/06/2016  . Left knee pain 06/28/2016  . Hypomagnesemia 03/02/2016  . Morbid obesity due to excess calories ([place]) 03/02/2016  . 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  . Adjustment disorder with mixed anxiety and depressed mood 05/25/2015  . 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 Eczema 02/27/2013  . Muscle cramps 11/13/2012  . Personal history of lymphoma 02/20/2012  . Hypothyroidism 02/20/2012  . Hyperlipidemia 02/20/2012    Past Medical History: Past Medical History:  Diagnosis Date  . 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 Allergic rhinitis   . Anemia    WHILE GOING THRU CHEMO  . Arthritis   . CHF (congestive heart failure) ([place])    PT DENIES  . Diastolic dysfunction    a. 11/2016 Echo: EF 60-65%, no rwma, mild LVH, Gr1 DD, triv MR, mildly dil LA, nl RV fxn.  . Diffuse large B cell lymphoma [facility]) Nov 2011   Dr Inez Pilgrim, Dr. Madelynn Done s/p RCHOP and methotrexate, c/b renal failure  . Diverticulosis   . Esophagitis   . GERD (gastroesophageal reflux disease)   . H/O stem cell transplant ([place]) 06/2011   a. in setting of lymphoma.  . Hemorrhoids   . History of chemotherapy 22-Oct-2010   RHCOP/methotrexate-intrathecal  . History of stress test    a. 05/2010 Myoview: nl EF, no ischemia/infarct.  . Hypercholesterolemia   . Hypertension   .  Hypothyroidism   . Morbid obesity ([place])   . 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 Paget disease, extra mammary    vulva, s/p resection 2014, Dr. Sabra Heck  . Pagets disease, extra-mammary   . TIA (transient ischemic attack)    X 2 -LAST ONE IN 2017    Past Surgical History: Past Surgical History:  Procedure Laterality Date  . ABDOMINAL HYSTERECTOMY  1985   Hysterectomy-partial  . BREAST BIOPSY  2002   Neg - AT [facility]  . BURR HOLE W/ PLACEMENT OMMAYA RESERVOIR    . BURR HOLE W/ PLACEMENT OMMAYA RESERVOIR  2012  . COLONOSCOPY  04/2013  . ESOPHAGOGASTRODUODENOSCOPY  04/2013  . INJECTION KNEE  07/02/2018  . INSERTION CENTRAL VENOUS ACCESS DEVICE W/ SUBCUTANEOUS PORT  2011   Port a Cath: Right chest Double Lumen, 04-Nov-2010  . LIMBAL STEM CELL TRANSPLANT  2012  . LIVER BIOPSY     stage 4B large Bcell lymphoma  . PARTIAL MASTECTOMY WITH NEEDLE LOCALIZATION AND AXILLARY SENTINEL LYMPH NODE BX Right 10/16/2019   Procedure: PARTIAL MASTECTOMY WITH NEEDLE LOCALIZATION AND AXILLARY SENTINEL LYMPH NODE BX;  Surgeon: Benjamine Sprague, DO;  Location: [facility]  ORS;  Service: General;  Laterality: Right;  . ROTATOR CUFF REPAIR Right 2016  . SHOULDER ARTHROSCOPY Right 06/22/2015   Procedure: ARTHROSCOPY SHOULDER, parital repair of rotator cuff, biceps tenodesis, decompression and debridement;  Surgeon: Corky Mull, MD;  Location: [facility] ORS;  Service: Orthopedics;  Laterality: Right;  . TONSILLECTOMY  1954  . VULVECTOMY    . VULVECTOMY PARTIAL N/A 09/29/2015   Procedure: VULVECTOMY PARTIAL;  Surgeon: Gillis Ends, MD;  Location: [facility] ORS;  Service: Gynecology;  Laterality: N/A;    Past Gynecologic History:  S/p hysterectomy  OB History:  OB History  No obstetric history on file.    Family History: Family History  Problem Relation Age of Onset  . Diabetes Mother        died @ 35 of MI.  Marland Kitchen Hypertension Mother   . Heart attack Mother   . Melanoma Father 82       died of  complications r/t melanoma w/ lung mets.  . Kidney disease Sister        Kidney removed   . Prostate cancer Brother        Prostate - dx in 53's  . Heart disease Brother 92       reported MI @ age 70, ? treated w/ TPA->no recurrent CAD, now in 75's.  Marland Kitchen Hearing loss Maternal Aunt   . Hearing loss Maternal Grandfather     Social History: Social History   Socioeconomic History  . Marital status: Married    Spouse name: Not on file  . Number of children: 2  . Years of education: Not on file  . Highest education level: Not on file  Occupational History  . 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 Smoking status: Never Smoker  . Smokeless tobacco: Never Used  Substance and Sexual Activity  . Alcohol use: No    Alcohol/week: 0.0 standard drinks    Comment: former user-no current use  . Drug use: No  . Sexual activity: Yes    Partners: Male    Birth control/protection: Post-menopausal  Other Topics Concern  . Not on file  Social History Narrative   Lives in [place] with husband, has 2 grown sons.  No pets. 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   Right-handed   Caffeine: occasional caffeine free/diet soda or hot tea   Social Determinants of Health   Financial Resource Strain:   . Difficulty of Paying Living Expenses: Not on file  Food Insecurity:   . Worried About Charity fundraiser in the Last Year: Not on file  . Ran Out of Food in the Last Year: Not on file  Transportation Needs:   . Lack of Transportation (Medical): Not on file  . Lack of Transportation (Non-Medical): Not on file  Physical Activity:   . Days of Exercise per Week: Not on file  . Minutes of Exercise per Session: Not on file  Stress:   . Feeling of Stress : Not on file  Social Connections:   . Frequency of Communication with Friends and Family: Not on file  . Frequency of Social Gatherings with Friends and Family: Not on file  . Attends Religious Services: Not on  file  . Active Member of Clubs or Organizations: Not on file  . Attends Archivist Meetings: Not on file  . Marital Status: Not on file  Intimate Partner Violence:   . Fear of Current or Ex-Partner:  Not on file  . Emotionally Abused: Not on file  . Physically Abused: Not on file  . Sexually Abused: Not on file    Allergies: Allergies  Allergen Reactions  . Erythromycin Nausea And Vomiting  . Iodine Swelling    IV iodine (states now that this can be tolerated with Benadryl)  . Morphine And Related Nausea And Vomiting and Nausea Only    Hallucinations  . Oxycodone Nausea And Vomiting  . Sulfa Antibiotics Other (See Comments)    Dizzy/Fainting  . Adhesive [Tape] Rash and Other (See Comments)    Including Bandaids    Current Medications: Current Outpatient Medications  Medication Sig Dispense Refill  . anastrozole (ARIMIDEX) 1 MG tablet Take 1 tablet (1 mg total) by mouth daily. 30 tablet 6  . aspirin EC 81 MG tablet Take 81 mg by mouth daily.    Marland Kitchen atorvastatin (LIPITOR) 80 MG tablet Take 1 tablet (80 mg total) by mouth daily at 6 PM. 90 tablet 1  . chlorthalidone (HYGROTON) 25 MG tablet Take 0.5 tablets (12.5 mg total) by mouth daily. 45 tablet 1  . cholecalciferol (VITAMIN D3) 25 MCG (1000 UT) tablet Take 1,000 Units by mouth daily.    . clobetasol cream (TEMOVATE) 7.25 % Apply 1 application topically daily as needed (psoriasis).     . lansoprazole (PREVACID) 30 MG capsule TAKE 1 CAPSULE(30 MG) BY MOUTH DAILY (Patient taking differently: Take 30 mg by mouth every morning. ) 90 capsule 1  . levothyroxine (SYNTHROID) 88 MCG tablet take 1 tablet by mouth every morning ON AN EMPTY STOMACH (Patient taking differently: Take 88 mcg by mouth daily before breakfast. ) 90 tablet 3  . lisinopril (ZESTRIL) 10 MG tablet TAKE 1 TABLET(10 MG) BY MOUTH DAILY 90 tablet 3  . mometasone (ELOCON) 0.1 % cream Apply 1 application topically daily as needed (eczema).   0  . Multiple Vitamin  (MULTIVITAMIN) tablet Take 1 tablet by mouth daily.    Marland Kitchen Specialty Vitamins Products (ICAPS LUTEIN & ZEAXANTHIN PO) Take by mouth every morning. Zeaxanthin 51m/Lutein 152m    . imiquimod (ALDARA) 5 % cream Apply topically 3 (three) times a week. 24 each 0   No current facility-administered medications for this visit.   Review of Systems General:  no complaints Skin: no complaints Eyes: no complaints HEENT: no complaints Breasts: no complaints Pulmonary: no complaints Cardiac: no complaints Gastrointestinal: no complaints Genitourinary/Sexual: no complaints Ob/Gyn: no complaints Musculoskeletal: no complaints Hematology: no complaints Neurologic/Psych: no complaints   Objective:  Physical Examination:  BP (!) 146/79 (BP Location: Left Arm, Patient Position: Sitting)   Pulse 79   Temp 98 F (36.7 C) (Tympanic)   Resp 18   Wt 242 lb (109.8 kg)   SpO2 98%   BMI 42.87 kg/m    Body mass index is 42.87 kg/m.  ECOG Performance Status: 0 - Asymptomatic   GENERAL: Patient is a well appearing female in no acute distress HEENT:  Sclera clear. Anicteric EXTREMITIES:  No peripheral edema. Atraumatic. No cyanosis NEURO:  Nonfocal. Well oriented.  Appropriate affect.  Pelvic: Exam Chaperoned by RN Vulva: Vulvar scars well-healed after prior resections.   At 7:00 to 9:00, sharp demarcation between mucosa/vulvar tissue and epithelial tissue on the right - overall the findings are stable compared to last visit.  Speculum and bimanual exam deferred.       Assessment:  Bailey FREELOVEs a [age].o. female diagnosed with recurrent Paget's disease of the vulva status post  wide local excision with positive margins and subsequent recurrence in 07/2015.  Repeat wide local excision consistent with recurrent Paget's disease of the vulva status post wide local excision with negative margins.  Biopsy in 2018 was negative.  Biopsy in 2020 at 7:00 consistent with recurrent Paget's.  She  deffered prior treatment due to breast cancer diagnosis and need for therapy. She has now completed treatment and she is ready to focus on management of Paget's disease.  She presents today for evaluation and initiation of imiquimod topical treatment.  History of recurrent vulvar candidiasis-resolved after 32-monthcourse of Diflucan.  No evidence of recurrent candidiasis on exam and asymptomatic today.  Right breast cancer-newly diagnosed with right breast cancer status post biopsy.  Plans for right lumpectomy on 10/16/2019 with Dr. SLysle Pearl  She is followed by medical oncology/Dr. BRogue Bussingfor possible adjuvant treatment.      Plan:   Problem List Items Addressed This Visit      Musculoskeletal and Integument   Paget disease, extra mammary - Primary (Chronic)     We again reviewed options for management and alternatives.  Patient has elected to proceed with topical imiquimod.  Today we discussed application.  Dr. STheora Gianottihas recommended topical application and thin layer 3 nights a week at bedtime, leave on the skin for 6-10 hours and remove following morning by washing the treated area with mild soap and water.  We discussed that local skin reactions/erythema at the treatment site are common.  If this occurs, she should take a rest period of several days to reduce discomfort and severity of skin reaction.  Treatment may resume once the reaction subsides.  She may wear nonocclusive dressing such as cotton gauze or underwear.  Advised not to use occlusive dressings.  Extensive teaching and application were conducted today.  Patient advised to wash hands before and after cream application.  Light thin layer and avoid use of excessive amounts.  Apply to external area only.  To avoid the cream getting on nontreatment sites was recommended that she apply Desitin or Vaseline to the nontreated skin surfaces.  If intolerant to treatment or refractory symptoms could consider vulvectomy with plastic surgery  for skin graft versus Mohs surgery versus continued close surveillance.  She is previously expressed her desires to avoid invasive procedures if possible.   Due to diagnosis of breast cancer after screening mammogram she is currently deferred screening colonoscopy.  Encouraged her to have this performed in the future.  Patient does request female providers only.  We will plan to have her return to clinic in 4 weeks for reevaluation and assessment of tolerance.  She will notify the clinic once she has received and started medication so that this appointment may be scheduled.  A total of (25) minutes of face-to-face time was spent with this patient with greater than 50% of that time in counseling and care-coordination.  Bailey Rutter DNP, AGNP-C [phone](clinic)  I personally had a face to face interaction and evaluated the patient jointly with the NP, Ms. Bailey Brooks  I have reviewed her history and available records and have performed the key portions of the physical exam including abdominal exam, pelvic exam with my findings confirming those documented above by the APP.  I have discussed the case with the APP and the patient.  I agree with the above documentation, assessment and plan which was fully formulated by me.  Counseling was completed by me.   I personally saw  the patient and performed a substantive portion of this encounter in conjunction with the listed APP as documented above.  Angeles Gaetana Michaelis, MD   CC: Dr. Theora Gianotti

## 2020-01-15 ENCOUNTER — Ambulatory Visit
Admission: RE | Admit: 2020-01-15 | Discharge: 2020-01-15 | Disposition: A | Discharge status: Home / Self Care | Payer: BC Managed Care – PPO | Source: Ambulatory Visit | Attending: Internal Medicine | Admitting: Internal Medicine

## 2020-01-15 DIAGNOSIS — Z13820 Encounter for screening for osteoporosis: Secondary | ICD-10-CM | POA: Diagnosis not present

## 2020-01-15 DIAGNOSIS — Z780 Asymptomatic menopausal state: Secondary | ICD-10-CM | POA: Diagnosis not present

## 2020-01-15 DIAGNOSIS — C50411 Malignant neoplasm of upper-outer quadrant of right female breast: Secondary | ICD-10-CM | POA: Insufficient documentation

## 2020-01-15 DIAGNOSIS — E2839 Other primary ovarian failure: Secondary | ICD-10-CM | POA: Diagnosis not present

## 2020-01-15 DIAGNOSIS — Z170 Estrogen receptor positive status [ER+]: Secondary | ICD-10-CM | POA: Insufficient documentation

## 2020-01-16 ENCOUNTER — Telehealth: Payer: Self-pay | Admitting: Nurse Practitioner

## 2020-01-16 ENCOUNTER — Other Ambulatory Visit: Payer: Self-pay | Admitting: Nurse Practitioner

## 2020-01-16 MED ORDER — IMIQUIMOD 5 % EX CREA: TOPICAL_CREAM | CUTANEOUS | 1 refills | Status: DC

## 2020-01-16 NOTE — Progress Notes (Signed)
Insurance denied Imiquimod. Will attempt to appeal. In interim, discussed GoodRx pricing for imiquimod. Patient agreeable and I sent prescription to pharmacy

## 2020-01-16 NOTE — Telephone Encounter (Signed)
-----   Message from Clancy Gourd sent at 01/08/2020  8:11 AM EST ----- Regarding: [facility] [place],  I just uploaded a [place] fax into this pt's chart in Epic under the Media tab. Thanks.

## 2020-01-16 NOTE — Telephone Encounter (Signed)
Imiquimod denied by insurance. Discussed GoodRx pricing for medication which patient would like to utilize. Can consider appeal as well through insurance. Prescription re-sent to pharmacy.

## 2020-01-19 ENCOUNTER — Other Ambulatory Visit: Payer: Self-pay | Admitting: *Deleted

## 2020-01-19 ENCOUNTER — Inpatient Hospital Stay
Admission: RE | Admit: 2020-01-19 | Discharge: 2020-01-19 | Disposition: A | Discharge status: Home / Self Care | Payer: Self-pay | Source: Ambulatory Visit | Attending: *Deleted | Admitting: *Deleted

## 2020-01-19 DIAGNOSIS — Z1231 Encounter for screening mammogram for malignant neoplasm of breast: Secondary | ICD-10-CM

## 2020-01-27 ENCOUNTER — Other Ambulatory Visit (INDEPENDENT_AMBULATORY_CARE_PROVIDER_SITE_OTHER): Payer: BC Managed Care – PPO

## 2020-01-27 ENCOUNTER — Other Ambulatory Visit: Payer: Self-pay

## 2020-01-27 ENCOUNTER — Encounter: Payer: Self-pay | Admitting: Internal Medicine

## 2020-01-27 ENCOUNTER — Inpatient Hospital Stay: Payer: BC Managed Care – PPO | Admitting: Internal Medicine

## 2020-01-27 DIAGNOSIS — E785 Hyperlipidemia, unspecified: Secondary | ICD-10-CM

## 2020-01-27 LAB — HEPATIC FUNCTION PANEL
ALT: 20 U/L (ref 0–35)
AST: 21 U/L (ref 0–37)
Albumin: 3.7 g/dL (ref 3.5–5.2)
Alkaline Phosphatase: 103 U/L (ref 39–117)
Bilirubin, Direct: 0.1 mg/dL (ref 0.0–0.3)
Total Bilirubin: 0.4 mg/dL (ref 0.2–1.2)
Total Protein: 6.7 g/dL (ref 6.0–8.3)

## 2020-01-27 LAB — LIPID PANEL
Cholesterol: 135 mg/dL (ref 0–200)
HDL: 34.7 mg/dL — ABNORMAL LOW (ref >39.00)
LDL Cholesterol: 74 mg/dL (ref 0–99)
NonHDL: 100.02
Total CHOL/HDL Ratio: 4
Triglycerides: 132 mg/dL (ref 0.0–149.0)
VLDL: 26.4 mg/dL (ref 0.0–40.0)

## 2020-01-27 NOTE — Assessment & Plan Note (Signed)
#  Right breast cancer stage I ER/PRPos; HER-2 negative.  Patient status post radiation.  Finished radiation approximately 1 day ago.  #Recommend initiation of anastrozole; will plan to start at next visit/month from now./Prescription sent.  #Discussed the mechanism of action of aromatase inhibitors-with blocking of estrogen to prevent breast cancer.  Also discussed the potential side effects including but not limited to arthralgias hot flashes and increased risk of osteoporosis. Will get baseline BMD.   #Hypokalemia-on potassium supplementation followed by PCP.  #Fatigue poor appetite-suspect from radiation.  Hopefully should improve in the next 1 month or so.  Recommend continued increase in physical activity.  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  Stable.  No clinical recurrence.  # paget's disease of Vulva- status post recent Bx [Dr.Secord] neg for malignancy.  # DISPOSITION:call pt's cell phone # BMD in 2-3 weeks # in 4 weeks;MD  no labs- Dr.B  Cc; Dr.Sonnenberg.

## 2020-01-28 ENCOUNTER — Ambulatory Visit
Admission: RE | Admit: 2020-01-28 | Discharge: 2020-01-28 | Disposition: A | Discharge status: Home / Self Care | Payer: BC Managed Care – PPO | Source: Ambulatory Visit | Attending: Radiation Oncology | Admitting: Radiation Oncology

## 2020-01-28 ENCOUNTER — Encounter: Payer: Self-pay | Admitting: Radiation Oncology

## 2020-01-28 ENCOUNTER — Encounter: Payer: Self-pay | Admitting: *Deleted

## 2020-01-28 ENCOUNTER — Other Ambulatory Visit: Payer: Self-pay

## 2020-01-28 VITALS — BP 140/75 | HR 79 | Temp 97.0°F | Resp 20 | Wt 240.6 lb

## 2020-01-28 DIAGNOSIS — Z79811 Long term (current) use of aromatase inhibitors: Secondary | ICD-10-CM | POA: Insufficient documentation

## 2020-01-28 DIAGNOSIS — C50411 Malignant neoplasm of upper-outer quadrant of right female breast: Secondary | ICD-10-CM | POA: Diagnosis not present

## 2020-01-28 DIAGNOSIS — Z923 Personal history of irradiation: Secondary | ICD-10-CM | POA: Insufficient documentation

## 2020-01-28 DIAGNOSIS — Z170 Estrogen receptor positive status [ER+]: Secondary | ICD-10-CM | POA: Diagnosis not present

## 2020-01-28 NOTE — Progress Notes (Signed)
Radiation Oncology Follow up Note  Name: Bailey Brooks   Date:   01/28/2020 MRN:  [id number] DOB: August 26, 1951    This 69 y.o. female presents to the clinic today for 1 month follow-up status post whole breast radiation to her right breast for a T1a ER positive PR positive invasive mammary carcinoma.  REFERRING PROVIDER: Leone Haven, MD  HPI: Patient is a 69 year old female now at 1 month having completed whole breast radiation to her right breast for stage I ER/PR positive invasive mammary carcinoma.  Seen today in routine follow-up she is doing well.  She specifically denies breast tenderness cough or bone pain..  She has been started on Arimidex although is waiting a bone density scan prior to initiating that medication.  COMPLICATIONS OF TREATMENT: none  FOLLOW UP COMPLIANCE: keeps appointments   PHYSICAL EXAM:  BP 140/75   Pulse 79   Temp (!) 97 F (36.1 C) (Tympanic)   Resp 20   Wt 240 lb 9.6 oz (109.1 kg)   BMI 42.62 kg/m  Lungs are clear to A&P cardiac examination essentially unremarkable with regular rate and rhythm. No dominant mass or nodularity is noted in either breast in 2 positions examined. Incision is well-healed. No axillary or supraclavicular adenopathy is appreciated. Cosmetic result is excellent.  Well-developed well-nourished patient in NAD. HEENT reveals PERLA, EOMI, discs not visualized.  Oral cavity is clear. No oral mucosal lesions are identified. Neck is clear without evidence of cervical or supraclavicular adenopathy. Lungs are clear to A&P. Cardiac examination is essentially unremarkable with regular rate and rhythm without murmur rub or thrill. Abdomen is benign with no organomegaly or masses noted. Motor sensory and DTR levels are equal and symmetric in the upper and lower extremities. Cranial nerves II through XII are grossly intact. Proprioception is intact. No peripheral adenopathy or edema is identified. No motor or sensory levels are noted. Crude  visual fields are within normal range.  RADIOLOGY RESULTS: No current films to review  PLAN: Present time she is doing extremely well 1 month out from whole breast radiation and pleased with her overall progress.  She will be starting Arimidex.  Patient knows to call with any concerns of asked to see her back in 4 to 5 months for follow-up.  I would like to take this opportunity to thank you for allowing me to participate in the care of your patient.Noreene Filbert, MD

## 2020-01-30 ENCOUNTER — Ambulatory Visit: Payer: BC Managed Care – PPO | Attending: Internal Medicine

## 2020-01-30 DIAGNOSIS — Z23 Encounter for immunization: Secondary | ICD-10-CM | POA: Insufficient documentation

## 2020-01-30 NOTE — Progress Notes (Signed)
   Covid-19 Vaccination Clinic  Name:  Bailey Brooks    MRN: [id number] DOB: 07/24/1951  01/30/2020  [name] was observed post Covid-19 immunization for 15 minutes without incidence. She was provided with Vaccine Information Sheet and instruction to access the V-Safe system.   [name] was instructed to call 911 with any severe reactions post vaccine: Marland Kitchen Difficulty breathing  . Swelling of your face and throat  . A fast heartbeat  . A bad rash all over your body  . Dizziness and weakness    Immunizations Administered    Name Date Dose VIS Date Route   Pfizer COVID-19 Vaccine 01/30/2020  8:18 AM 0.3 mL 11/14/2019 Intramuscular   Manufacturer: [place]   Lot: [id number]   [place]: [id number]

## 2020-02-02 ENCOUNTER — Inpatient Hospital Stay: Payer: BC Managed Care – PPO | Attending: Internal Medicine | Admitting: Internal Medicine

## 2020-02-02 DIAGNOSIS — Z79899 Other long term (current) drug therapy: Secondary | ICD-10-CM | POA: Insufficient documentation

## 2020-02-02 DIAGNOSIS — E039 Hypothyroidism, unspecified: Secondary | ICD-10-CM | POA: Insufficient documentation

## 2020-02-02 DIAGNOSIS — I10 Essential (primary) hypertension: Secondary | ICD-10-CM | POA: Insufficient documentation

## 2020-02-02 DIAGNOSIS — Z79811 Long term (current) use of aromatase inhibitors: Secondary | ICD-10-CM | POA: Insufficient documentation

## 2020-02-02 DIAGNOSIS — C50411 Malignant neoplasm of upper-outer quadrant of right female breast: Secondary | ICD-10-CM | POA: Diagnosis not present

## 2020-02-02 DIAGNOSIS — Z7982 Long term (current) use of aspirin: Secondary | ICD-10-CM | POA: Insufficient documentation

## 2020-02-02 DIAGNOSIS — Z8572 Personal history of non-Hodgkin lymphomas: Secondary | ICD-10-CM | POA: Insufficient documentation

## 2020-02-02 DIAGNOSIS — Z170 Estrogen receptor positive status [ER+]: Secondary | ICD-10-CM

## 2020-02-02 DIAGNOSIS — Z8544 Personal history of malignant neoplasm of other female genital organs: Secondary | ICD-10-CM | POA: Insufficient documentation

## 2020-02-02 DIAGNOSIS — Z923 Personal history of irradiation: Secondary | ICD-10-CM | POA: Insufficient documentation

## 2020-02-02 NOTE — Assessment & Plan Note (Addendum)
#  Right breast cancer stage I ER/PRPos; HER-2 negative.  Status post radiation.  Recommend starting anastrozole.  #Again reviewed the mechanism of action; and the potential side effects in detail-including but not limited to hot flashes arthritic pain/myalgias risk of osteoporosis-see below.  #Bone density test-February 2021-normal limits.  No evidence of osteoporosis.  However recommend calcium plus vitamin D.   # DISPOSITION:call pt's cell phone # Follow up in  84month- MD- video  no labs- Dr.B  Cc; Dr.Sonnenberg.

## 2020-02-02 NOTE — Progress Notes (Signed)
I connected with Bailey Brooks on 02/02/2020 at  3:00 PM EST by video enabled telemedicine visit and verified that I am speaking with the correct person using two identifiers.  I discussed the limitations, risks, security and privacy concerns of performing an evaluation and management service by telemedicine and the availability of in-person appointments. I also discussed with the patient that there may be a patient responsible charge related to this service. The patient expressed understanding and agreed to proceed.    Other persons participating in the visit and their role in the encounter: RN/medical reconciliation Patient's location:home Provider's location: office  Oncology History Overview Note  # RIGHT BREAST CANCER stage I [pTapN0; G-1; ER-100%; PR-99%; Her-2-NEG; Dr.Sakai]. 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   DIAGNOSIS: Right breast  STAGE:   I      ;  GOALS: cure  CURRENT/MOST RECENT THERAPY : anastrazole   Diffuse large b-cell lymphoma, intra-abdominal lymph nodes (HCC)  Carcinoma of upper-outer quadrant of right breast in female, estrogen receptor positive ([place])  10/28/2019 Initial Diagnosis   Carcinoma of upper-outer quadrant of right breast in female, estrogen receptor positive ([place])      Chief Complaint: breast cancer   History of present illness:Bailey Brooks 69 y.o.  female with history of stage I ER/PR positive HER-2 negative breast cancer currently status post radiation is here for follow-up/review the results of bone density.  Patient appetite is good.  No weight loss.  Energy level is improving.  Radiation rash getting  better.  Observation/objective: No labs.  Assessment and plan: Carcinoma of upper-outer quadrant of right breast in female, estrogen receptor positive ([place]) # Right breast cancer stage I ER/PRPos; HER-2 negative.  Status post radiation.  Recommend starting anastrozole.  #Again reviewed the mechanism of action; and the potential side effects in detail-including but not limited to hot flashes arthritic pain/myalgias risk of osteoporosis-see below.  #Bone density test-February 2021-normal limits.  No evidence of osteoporosis.  However recommend calcium plus vitamin D.   # DISPOSITION:call pt's cell phone # Follow up in  78month- MD- video  no labs- Dr.B  Cc; Dr.Sonnenberg.    Follow-up instructions:  I discussed the assessment and treatment plan with the patient.  The patient was provided an opportunity to ask questions and all were answered.  The patient agreed with the plan and demonstrated understanding of instructions.  The patient was advised to call back or seek an in person evaluation if the symptoms worsen or if the condition fails to improve as anticipated.  Dr. GCharlaine DaltonCHCC [facility]/01/2020 7:56 AM

## 2020-02-05 ENCOUNTER — Encounter: Payer: Self-pay | Admitting: Internal Medicine

## 2020-02-06 ENCOUNTER — Telehealth: Payer: Self-pay | Admitting: Family Medicine

## 2020-02-06 NOTE — Telephone Encounter (Signed)
Pt called to get lab results °

## 2020-02-06 NOTE — Telephone Encounter (Signed)
Called the patient and gave lab results.  Harvir Patry,cma

## 2020-02-09 ENCOUNTER — Other Ambulatory Visit: Payer: Self-pay | Admitting: Family Medicine

## 2020-02-09 MED ORDER — EZETIMIBE 10 MG PO TABS: 10.0000 mg | ORAL_TABLET | Freq: Every day | ORAL | 3 refills | Status: DC

## 2020-02-11 ENCOUNTER — Inpatient Hospital Stay: Payer: BC Managed Care – PPO

## 2020-02-16 ENCOUNTER — Encounter: Payer: Self-pay | Admitting: Internal Medicine

## 2020-02-18 ENCOUNTER — Inpatient Hospital Stay (HOSPITAL_BASED_OUTPATIENT_CLINIC_OR_DEPARTMENT_OTHER): Payer: BC Managed Care – PPO | Admitting: Obstetrics and Gynecology

## 2020-02-18 ENCOUNTER — Other Ambulatory Visit: Payer: Self-pay

## 2020-02-18 VITALS — BP 135/84 | HR 69 | Temp 98.2°F | Resp 20 | Wt 240.0 lb

## 2020-02-18 DIAGNOSIS — C50411 Malignant neoplasm of upper-outer quadrant of right female breast: Secondary | ICD-10-CM | POA: Diagnosis not present

## 2020-02-18 DIAGNOSIS — Z8544 Personal history of malignant neoplasm of other female genital organs: Secondary | ICD-10-CM

## 2020-02-18 DIAGNOSIS — Z79811 Long term (current) use of aromatase inhibitors: Secondary | ICD-10-CM | POA: Diagnosis not present

## 2020-02-18 DIAGNOSIS — I10 Essential (primary) hypertension: Secondary | ICD-10-CM | POA: Diagnosis not present

## 2020-02-18 DIAGNOSIS — Z8572 Personal history of non-Hodgkin lymphomas: Secondary | ICD-10-CM | POA: Diagnosis not present

## 2020-02-18 DIAGNOSIS — N766 Ulceration of vulva: Secondary | ICD-10-CM

## 2020-02-18 DIAGNOSIS — Z170 Estrogen receptor positive status [ER+]: Secondary | ICD-10-CM | POA: Diagnosis not present

## 2020-02-18 DIAGNOSIS — E039 Hypothyroidism, unspecified: Secondary | ICD-10-CM | POA: Diagnosis not present

## 2020-02-18 DIAGNOSIS — C4499 Other specified malignant neoplasm of skin, unspecified: Secondary | ICD-10-CM

## 2020-02-18 DIAGNOSIS — Z923 Personal history of irradiation: Secondary | ICD-10-CM | POA: Diagnosis not present

## 2020-02-18 DIAGNOSIS — Z7982 Long term (current) use of aspirin: Secondary | ICD-10-CM | POA: Diagnosis not present

## 2020-02-18 DIAGNOSIS — Z79899 Other long term (current) drug therapy: Secondary | ICD-10-CM | POA: Diagnosis not present

## 2020-02-18 NOTE — Progress Notes (Signed)
Patient here today for follow up regarding Pagets of the vulva. Patient reports vulvar itching and irritation since starting medication.

## 2020-02-18 NOTE — Progress Notes (Signed)
Gynecologic Oncology Interval Visit   Referring Provider: Dr. Kenton Kingfisher  Chief Concern: Recurrent paget's disease of the vulva  Subjective:  Bailey Brooks is a 69 y.o. female, diagnosed with Paget's of the vulva, s/p WLE and vulvar biopsy 09/29/2015, who returns to clinic to re-evaluation after starting imiquimod 5 weeks ago.     Gynecologic Oncology History: Bailey Brooks has a history of localized vulvar Paget's disease.   04/2013- vulvar biopsy revealed Paget's disease               WLE, additional margins resected for positive disease on frozen evaluation.   Part A: VULVA, VAGINAL MARGIN:  - POSITIVE FOR EXTRAMAMMARY PAGET'S DISEASE  Part B: VULVA, POSTERIOR MARGIN:  - POSITIVE FOR EXTRAMAMMARY PAGET'S DISEASE  Part C: VULVA, RIGHT, PARTIAL VULVECTOMY:  - EXTRAMAMMARY PAGET'S DISEASE  Part D: VULVA, NEW VAGINAL MARGIN:  - POSITIVE FOR EXTRAMAMMARY PAGET'S DISEASE  07/28/2015 for routine surveillance an area on the right vulva seemed more suspicious for recurrence. This was biopsied and confirmed recurrent Paget's disease.   She underwent repeat excision 09/29/15 A. VULVA; EXCISION:  - RARE CYTOKERATIN 7 POSITIVE INTRAEPIDERMAL CELLS, INTERPRETED AS RESIDUAL PAGET DISEASE.  - THE SURGICAL MARGINS ARE CLEAR.   B. VULVA, 11:00; BIOPSY:  - RARE CYTOKERATIN 7 POSITIVE INTRAEPIDERMAL CELLS, INTERPRETED AS PAGET DISEASE.   No additional issues since surgery.   She had vulvar biopsy on 05/09/17 which was negative:   DIAGNOSIS:  A. VULVA; BIOPSY:  - SKIN WITH FOCAL KERATOSIS AND HYPERGRANULOSIS - NEGATIVE FOR DYSPLASIA AND MALIGNANCY  Of note, she has had a h/o recurrent vulvar candidiasis. Prior to her vulvar surgery 09/2015 she received 5 weekly doses of oral diflucan for vulvar candidiasis. Postop she was treated for yeast infection again in 12/17 and then took one Diflucan a week for six months with good results.   Seen in gyn-onc clinic on 01/16/18 by Dr. Theora Gianotti. NED at  that time.   She has swelling of her lower extremities which was evaluated by her PCP and thought to be related to amlodipine. She started lasix which has improved her symptoms. She was seen by Dr. 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   She saw Dr. Theora Gianotti on 01/29/2019 with symptoms concerning for candidiasis at that time but no evidence of recurrent disease.   Her blood pressure medications were recently changes due to swelling secondary to amlodipine. No on lisinopril.   Previous biopsies were consistent with recurrent Paget's.   DIAGNOSIS:  A. VULVA, LEFT AT 3:00; PUNCH BIOPSY:  - BENIGN VULVAR TISSUE.  - NEGATIVE FOR DYSPLASIA AND MALIGNANCY.  DIAGNOSIS:  A. VULVA, RIGHT AT 7:00; PUNCH BIOPSY:  - EXTRAMAMMARY PAGET'S DISEASE OF THE VULVA.  - NEGATIVE FOR DYSPLASIA AND MALIGNANCY.   In the interim she has completed surgery and radiation for stage I right breast cancer, ER/PR positive HER-2/neu negative. She completed radiation on 12/29/2019. Anastrozole was recommended and she plans to start in approximately late February.   At last visit we had recommended start Aldara but due to recent diagnosis of breast cancer at that time, patient requested to wait to start treatment and returns today to discuss options for management and consider starting Aldara. She is concerned about reaching site to be treated and tolerability. Her daughter-in-law is going to help her with application. Previously she had expressed desire to avoid surgery/Mohs.   Problem List: Patient Active Problem List   Diagnosis  Date Noted  . Carcinoma of upper-outer quadrant of right breast in female, estrogen receptor positive ([place]) 10/28/2019  . Stress 08/06/2019  . Breast cancer screening 08/06/2019  . Pain due to onychomycosis of toenails of both feet 06/09/2019  . Prediabetes 12/20/2018  . Macular degeneration 06/20/2018  . GERD (gastroesophageal  reflux disease) 06/20/2018  . Hypersomnia 06/20/2018  . Chronic pain of right knee 06/20/2018  . Bruising 12/14/2017  . Diffuse large B cell lymphoma ([place]) 06/11/2017  . Diffuse large b-cell lymphoma, intra-abdominal lymph nodes ([place]) 06/11/2017  . Vulvar lesion 05/16/2017  . Low back pain 04/03/2017  . History of transient ischemic attack (TIA) 02/10/2017  . Hypokalemia 02/10/2017  . Exertional shortness of breath 11/06/2016  . Right hip pain 08/03/2016  . Essential hypertension 07/06/2016  . Left knee pain 06/28/2016  . Hypomagnesemia 03/02/2016  . Morbid obesity due to excess calories ([place]) 03/02/2016  . 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  . Adjustment disorder with mixed anxiety and depressed mood 05/25/2015  . 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 Eczema 02/27/2013  . Muscle cramps 11/13/2012  . Personal history of lymphoma 02/20/2012  . Hypothyroidism 02/20/2012  . Hyperlipidemia 02/20/2012    Past Medical History: Past Medical History:  Diagnosis Date  . 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 Allergic rhinitis   . Anemia    WHILE GOING THRU CHEMO  . Arthritis   . CHF (congestive heart failure) ([place])    PT DENIES  . Diastolic dysfunction    a. 11/2016 Echo: EF 60-65%, no rwma, mild LVH, Gr1 DD, triv MR, mildly dil LA, nl RV fxn.  . Diffuse large B cell lymphoma [facility]) Nov 2011   Dr Inez Pilgrim, Dr. Madelynn Done s/p [facility] and methotrexate, c/b renal failure  . Diverticulosis   . Esophagitis   . GERD (gastroesophageal reflux disease)   . H/O stem cell transplant ([place]) 06/2011   a. in setting of lymphoma.  . Hemorrhoids   . History of chemotherapy 22-Oct-2010   RHCOP/methotrexate-intrathecal  . History of stress test    a. 05/2010 Myoview: nl EF, no ischemia/infarct.  . Hypercholesterolemia   . Hypertension   . Hypothyroidism   . Morbid obesity ([place])   . [place]  reservoir present    IN SCALP FROM 2012  . Paget disease, extra mammary    vulva, s/p resection 2014, Dr. Sabra Heck  . Pagets disease, extra-mammary   . TIA (transient ischemic attack)    X 2 -LAST ONE IN 2017    Past Surgical History: Past Surgical History:  Procedure Laterality Date  . ABDOMINAL HYSTERECTOMY  1985   Hysterectomy-partial  . BREAST BIOPSY  2002   Neg - AT [facility]  . BURR HOLE W/ PLACEMENT OMMAYA RESERVOIR    . BURR HOLE W/ PLACEMENT OMMAYA RESERVOIR  2012  . COLONOSCOPY  04/2013  . ESOPHAGOGASTRODUODENOSCOPY  04/2013  . INJECTION KNEE  07/02/2018  . INSERTION CENTRAL VENOUS ACCESS DEVICE W/ SUBCUTANEOUS PORT  2011   Port a Cath: Right chest Double Lumen, 04-Nov-2010  . LIMBAL STEM CELL TRANSPLANT  2012  . LIVER BIOPSY     stage 4B large Bcell lymphoma  . PARTIAL MASTECTOMY WITH NEEDLE LOCALIZATION AND AXILLARY SENTINEL LYMPH NODE BX Right 10/16/2019   Procedure: PARTIAL MASTECTOMY WITH NEEDLE LOCALIZATION AND AXILLARY SENTINEL LYMPH NODE BX;  Surgeon: Benjamine Sprague, DO;  Location: [facility] ORS;  Service: General;  Laterality: Right;  .  ROTATOR CUFF REPAIR Right 2016  . SHOULDER ARTHROSCOPY Right 06/22/2015   Procedure: ARTHROSCOPY SHOULDER, parital repair of rotator cuff, biceps tenodesis, decompression and debridement;  Surgeon: Corky Mull, MD;  Location: [facility] ORS;  Service: Orthopedics;  Laterality: Right;  . TONSILLECTOMY  1954  . VULVECTOMY    . VULVECTOMY PARTIAL N/A 09/29/2015   Procedure: VULVECTOMY PARTIAL;  Surgeon: Gillis Ends, MD;  Location: [facility] ORS;  Service: Gynecology;  Laterality: N/A;    Past Gynecologic History:  S/p hysterectomy  OB History:  OB History  No obstetric history on file.    Family History: Family History  Problem Relation Age of Onset  . Diabetes Mother        died @ 51 of MI.  Marland Kitchen Hypertension Mother   . Heart attack Mother   . Melanoma Father 21       died of complications r/t melanoma w/ lung mets.  . Kidney  disease Sister        Kidney removed   . Prostate cancer Brother        Prostate - dx in 81's  . Heart disease Brother 58       reported MI @ age 46, ? treated w/ TPA->no recurrent CAD, now in 65's.  Marland Kitchen Hearing loss Maternal Aunt   . Hearing loss Maternal Grandfather     Social History: Social History   Socioeconomic History  . Marital status: Married    Spouse name: Not on file  . Number of children: 2  . Years of education: Not on file  . Highest education level: Not on file  Occupational History  . 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 Smoking status: Never Smoker  . Smokeless tobacco: Never Used  Substance and Sexual Activity  . Alcohol use: No    Alcohol/week: 0.0 standard drinks    Comment: former user-no current use  . Drug use: No  . Sexual activity: Yes    Partners: Male    Birth control/protection: Post-menopausal  Other Topics Concern  . Not on file  Social History Narrative   Lives in [place] with husband, has 2 grown sons.  No pets. 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   Right-handed   Caffeine: occasional caffeine free/diet soda or hot tea   Social Determinants of Health   Financial Resource Strain:   . Difficulty of Paying Living Expenses:   Food Insecurity:   . Worried About Charity fundraiser in the Last Year:   . Arboriculturist in the Last Year:   Transportation Needs:   . Film/video editor (Medical):   Marland Kitchen Lack of Transportation (Non-Medical):   Physical Activity:   . Days of Exercise per Week:   . Minutes of Exercise per Session:   Stress:   . Feeling of Stress :   Social Connections:   . Frequency of Communication with Friends and Family:   . Frequency of Social Gatherings with Friends and Family:   . Attends Religious Services:   . Active Member of Clubs or Organizations:   . Attends Archivist Meetings:   Marland Kitchen Marital Status:   Intimate Partner Violence:   . Fear of Current or  Ex-Partner:   . Emotionally Abused:   Marland Kitchen Physically Abused:   . Sexually Abused:     Allergies: Allergies  Allergen Reactions  . Erythromycin Nausea And Vomiting  . Iodine Swelling  IV iodine (states now that this can be tolerated with Benadryl)  . Morphine And Related Nausea And Vomiting and Nausea Only    Hallucinations  . Oxycodone Nausea And Vomiting  . Sulfa Antibiotics Other (See Comments)    Dizzy/Fainting  . Adhesive [Tape] Rash and Other (See Comments)    Including Bandaids    Current Medications: Current Outpatient Medications  Medication Sig Dispense Refill  . anastrozole (ARIMIDEX) 1 MG tablet Take 1 tablet (1 mg total) by mouth daily. 30 tablet 6  . aspirin EC 81 MG tablet Take 81 mg by mouth daily.    . chlorthalidone (HYGROTON) 25 MG tablet Take 0.5 tablets (12.5 mg total) by mouth daily. 45 tablet 1  . cholecalciferol (VITAMIN D3) 25 MCG (1000 UT) tablet Take 1,000 Units by mouth daily.    . clobetasol cream (TEMOVATE) 0.73 % Apply 1 application topically daily as needed (psoriasis).     . ezetimibe (ZETIA) 10 MG tablet Take 1 tablet (10 mg total) by mouth daily. 90 tablet 3  . imiquimod (ALDARA) 5 % cream Apply topically 3 (three) times a week. 12 each 1  . lansoprazole (PREVACID) 30 MG capsule TAKE 1 CAPSULE(30 MG) BY MOUTH DAILY (Patient taking differently: Take 30 mg by mouth every morning. ) 90 capsule 1  . levothyroxine (SYNTHROID) 88 MCG tablet take 1 tablet by mouth every morning ON AN EMPTY STOMACH (Patient taking differently: Take 88 mcg by mouth daily before breakfast. ) 90 tablet 3  . lisinopril (ZESTRIL) 10 MG tablet TAKE 1 TABLET(10 MG) BY MOUTH DAILY 90 tablet 3  . mometasone (ELOCON) 0.1 % cream Apply 1 application topically daily as needed (eczema).   0  . Multiple Vitamin (MULTIVITAMIN) tablet Take 1 tablet by mouth daily.    Marland Kitchen Specialty Vitamins Products (ICAPS LUTEIN & ZEAXANTHIN PO) Take by mouth every morning. Zeaxanthin 50m/Lutein 152m      No current facility-administered medications for this visit.   Review of Systems General:  no complaints Skin: no complaints Eyes: no complaints HEENT: no complaints Breasts: no complaints Pulmonary: no complaints Cardiac: no complaints Gastrointestinal: no complaints Genitourinary/Sexual: no complaints Ob/Gyn: no complaints Musculoskeletal: no complaints Hematology: no complaints Neurologic/Psych: no complaints   Objective:  Physical Examination:  BP 135/84   Pulse 69   Temp 98.2 F (36.8 C) (Tympanic)   Resp 20   Wt 240 lb (108.9 kg)   BMI 42.51 kg/m    Body mass index is 42.51 kg/m.  ECOG Performance Status: 0 - Asymptomatic   GENERAL: Patient is a well appearing female in no acute distress HEENT:  Sclera clear. Anicteric EXTREMITIES:  No peripheral edema. Atraumatic. No cyanosis NEURO:  Nonfocal. Well oriented.  Appropriate affect.  Pelvic: Exam Chaperoned by RN Vulva: Vulvar scars well-healed after prior resections.   At 7:00 to 9:00, sharp demarcation between mucosa/vulvar tissue and epithelial tissue on the right - overall the findings are stable compared to last visit.  On the left she had a 8 mm superificial ulceration.The left labia majora was erythematous and there were scatter areas of intense erythema around the ulceration. Speculum and bimanual exam deferred.       Assessment:  Bailey Brooks a [age].o. female diagnosed with recurrent Paget's disease of the vulva status post wide local excision with positive margins and subsequent recurrence in 07/2015.  Repeat wide local excision consistent with recurrent Paget's disease of the vulva status post wide local excision with negative margins.  Biopsy  in 2018 was negative.  Biopsy in 2020 at 7:00 consistent with recurrent Paget's.  She deffered prior treatment due to breast cancer diagnosis and need for therapy. She has now completed treatment and she is ready to focus on management of Paget's disease.   She presents today for evaluation after ~ 5 weeks imiquimod topical treatment.  Superficial left vulvar ulceration.  History of recurrent vulvar candidiasis-resolved after 15-monthcourse of Diflucan.  No evidence of recurrent candidiasis on exam and asymptomatic today.  Right breast cancer-newly diagnosed with right breast cancer status post biopsy.  Plans for right lumpectomy on 10/16/2019 with Dr. SLysle Pearl  She is followed by medical oncology/Dr. BRogue Bussingfor possible adjuvant treatment.      Plan:   Problem List Items Addressed This Visit      Musculoskeletal and Integument   Paget disease, extra mammary - Primary (Chronic)    Other Visit Diagnoses    Vulvar ulcer         We recommended holding further therapy to allow the vulvar ulceration to heal. She will return in 2-3 weeks for re-evaluation. We advised that when she restarts treatment Vaseline need to be liberally applied to the left vulva.   If intolerant to treatment or refractory symptoms could consider vulvectomy with plastic surgery for skin graft versus Mohs surgery versus continued close surveillance.  She is previously expressed her desires to avoid invasive procedures if possible.   Due to diagnosis of breast cancer after screening mammogram she is currently deferred screening colonoscopy.  Encouraged her to have this performed in the future.  Patient does request female providers only.  We will plan to have her return to clinic in 2-3 weeks for reevaluation and assessment of tolerance.  She will notify the clinic once she has received and started medication so that this appointment may be scheduled.  A total of (15) minutes of face-to-face time was spent with this patient with greater than 50% of that time in counseling and care-coordination.  LBeckey Rutter DNP, AGNP-C [facility]at [phone](clinic)  I personally had a face to face interaction and evaluated the patient jointly with the  NP, Ms. LBeckey Rutter  I have reviewed her history and available records and have performed the key portions of the physical exam including pelvic exam with my findings confirming those documented above by the APP.  I have discussed the case with the APP and the patient.  I agree with the above documentation, assessment and plan which was fully formulated by me.  Counseling was completed by me.   I personally saw the patient and performed a substantive portion of this encounter in conjunction with the listed APP as documented above.  Bailey AGaetana Michaelis MD   CC: Dr. STheora Gianotti

## 2020-02-19 ENCOUNTER — Other Ambulatory Visit: Payer: Self-pay | Admitting: Family Medicine

## 2020-02-19 ENCOUNTER — Inpatient Hospital Stay: Payer: BC Managed Care – PPO | Attending: Internal Medicine | Admitting: Genetic Counselor

## 2020-02-19 ENCOUNTER — Encounter: Payer: Self-pay | Admitting: Genetic Counselor

## 2020-02-19 DIAGNOSIS — C4499 Other specified malignant neoplasm of skin, unspecified: Secondary | ICD-10-CM

## 2020-02-19 DIAGNOSIS — C50411 Malignant neoplasm of upper-outer quadrant of right female breast: Secondary | ICD-10-CM

## 2020-02-19 DIAGNOSIS — Z808 Family history of malignant neoplasm of other organs or systems: Secondary | ICD-10-CM | POA: Insufficient documentation

## 2020-02-19 DIAGNOSIS — Z803 Family history of malignant neoplasm of breast: Secondary | ICD-10-CM | POA: Insufficient documentation

## 2020-02-19 DIAGNOSIS — Z8572 Personal history of non-Hodgkin lymphomas: Secondary | ICD-10-CM

## 2020-02-19 DIAGNOSIS — Z170 Estrogen receptor positive status [ER+]: Secondary | ICD-10-CM

## 2020-02-19 DIAGNOSIS — Z8042 Family history of malignant neoplasm of prostate: Secondary | ICD-10-CM

## 2020-02-19 DIAGNOSIS — Z79811 Long term (current) use of aromatase inhibitors: Secondary | ICD-10-CM

## 2020-02-19 DIAGNOSIS — Z1379 Encounter for other screening for genetic and chromosomal anomalies: Secondary | ICD-10-CM

## 2020-02-19 DIAGNOSIS — I10 Essential (primary) hypertension: Secondary | ICD-10-CM

## 2020-02-19 NOTE — Progress Notes (Addendum)
REFERRING PROVIDER: Leone Haven, MD [address] [place],  [place] [address]  PRIMARY PROVIDER:  Leone Haven, MD  PRIMARY REASON FOR VISIT:  1. Personal history of lymphoma   2. Family history of breast cancer   3. Family history of prostate cancer   4. Family history of melanoma   5. Extramammary Paget disease   6. Carcinoma of upper-outer quadrant of right breast in female, estrogen receptor positive ([place])      HISTORY OF PRESENT ILLNESS:   Bailey Brooks, a 69 y.o. female, was seen for a [place] cancer genetics consultation at the request of Dr. Rogue Bussing due to a personal and family history of cancer.  Bailey Brooks presents to clinic today to discuss the possibility of a hereditary predisposition to cancer, genetic testing, and to further clarify her future cancer risks, as well as potential cancer risks for family members.   In 2011, at the age of 89, Bailey Brooks was diagnosed with B cell lymphoma of the liver. The treatment plan included chemotherapy and an autologous stem cell transplant.  In 2014, at the age of 48, Bailey Brooks was diagnosed with paget disease of the vulva.  This was treated with surgery.  In 2020, at the age of 44, Bailey Brooks was diagnosed with breast cancer. She reports that her sister underwent genetic testing and was found to be negative.     CANCER HISTORY:  Oncology History Overview Note  # RIGHT BREAST CANCER stage I [pTapN0; G-1; ER-100%; PR-99%; Her-2-NEG; Dr.Sakai]. 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   DIAGNOSIS: Right breast  STAGE:   I      ;  GOALS: cure  CURRENT/MOST RECENT THERAPY :  anastrazole   Diffuse large b-cell lymphoma, intra-abdominal lymph nodes (HCC)  Carcinoma of upper-outer quadrant of right breast in female, estrogen receptor positive ([place])  10/28/2019 Initial Diagnosis   Carcinoma of upper-outer quadrant of right breast in female, estrogen receptor positive ([place])      RISK FACTORS:  Menarche was at age 69.  First live birth at age 50.  Ovaries intact: one ovary is intact.  Hysterectomy: yes.  Menopausal status: postmenopausal.  HRT use: 0 years. Colonoscopy: yes; normal. Mammogram within the last year: yes. Number of breast biopsies: 4. Up to date with pelvic exams: yes. Any excessive radiation exposure in the past: no  Past Medical History:  Diagnosis Date  . 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 Allergic rhinitis   . Anemia    WHILE GOING THRU CHEMO  . Arthritis   . CHF (congestive heart failure) ([place])    PT DENIES  . Diastolic dysfunction    a. 11/2016 Echo: EF 60-65%, no rwma, mild LVH, Gr1 DD, triv MR, mildly dil LA, nl RV fxn.  . Diffuse large B cell lymphoma [facility]) Nov 2011   Dr Inez Pilgrim, Dr. Madelynn Done s/p RCHOP and methotrexate, c/b renal failure  . Diverticulosis   . Esophagitis   . Family history of breast cancer   . Family history of melanoma   . Family history of prostate cancer   . GERD (gastroesophageal  reflux disease)   . H/O stem cell transplant ([place]) 06/2011   a. in setting of lymphoma.  . Hemorrhoids   . History of chemotherapy 22-Oct-2010   RHCOP/methotrexate-intrathecal  . History of stress test    a. 05/2010 Myoview: nl EF, no ischemia/infarct.  . Hypercholesterolemia   . Hypertension   . Hypothyroidism   . Morbid obesity ([place])   . 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 Paget disease, extra mammary    vulva, s/p resection 2014, Dr. Sabra Heck  . Pagets disease, extra-mammary   . TIA (transient ischemic attack)    X 2 -LAST ONE IN 2017     Past Surgical History:  Procedure Laterality Date  . ABDOMINAL HYSTERECTOMY  1985   Hysterectomy-partial  . BREAST BIOPSY  2002   Neg - AT [facility]  . BURR HOLE W/ PLACEMENT OMMAYA RESERVOIR    . BURR HOLE W/ PLACEMENT OMMAYA RESERVOIR  2012  . COLONOSCOPY  04/2013  . ESOPHAGOGASTRODUODENOSCOPY  04/2013  . INJECTION KNEE  07/02/2018  . INSERTION CENTRAL VENOUS ACCESS DEVICE W/ SUBCUTANEOUS PORT  2011   Port a Cath: Right chest Double Lumen, 04-Nov-2010  . LIMBAL STEM CELL TRANSPLANT  2012  . LIVER BIOPSY     stage 4B large Bcell lymphoma  . PARTIAL MASTECTOMY WITH NEEDLE LOCALIZATION AND AXILLARY SENTINEL LYMPH NODE BX Right 10/16/2019   Procedure: PARTIAL MASTECTOMY WITH NEEDLE LOCALIZATION AND AXILLARY SENTINEL LYMPH NODE BX;  Surgeon: Benjamine Sprague, DO;  Location: [facility] ORS;  Service: General;  Laterality: Right;  . ROTATOR CUFF REPAIR Right 2016  . SHOULDER ARTHROSCOPY Right 06/22/2015   Procedure: ARTHROSCOPY SHOULDER, parital repair of rotator cuff, biceps tenodesis, decompression and debridement;  Surgeon: Corky Mull, MD;  Location: [facility] ORS;  Service: Orthopedics;  Laterality: Right;  . TONSILLECTOMY  1954  . VULVECTOMY    . VULVECTOMY PARTIAL N/A 09/29/2015   Procedure: VULVECTOMY PARTIAL;  Surgeon: Gillis Ends, MD;  Location: [facility] ORS;  Service: Gynecology;  Laterality: N/A;    Social History   Socioeconomic History  . Marital status: Married    Spouse name: Not on file  . Number of children: 2  . Years of education: Not on file  . Highest education level: Not on file  Occupational History  . 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 Smoking status: Never Smoker  . Smokeless tobacco: Never Used  Substance and Sexual Activity  . Alcohol use: No    Alcohol/week: 0.0 standard drinks    Comment: former user-no current use  . Drug use: No  . Sexual activity: Yes    Partners: Male    Birth  control/protection: Post-menopausal  Other Topics Concern  . Not on file  Social History Narrative   Lives in [place] with husband, has 2 grown sons.  No pets. 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   Right-handed   Caffeine: occasional caffeine free/diet soda or hot tea   Social Determinants of Health   Financial Resource Strain:   . Difficulty of Paying Living Expenses:   Food Insecurity:   . Worried About Charity fundraiser in the Last Year:   . Arboriculturist in the Last Year:   Transportation Needs:   . Film/video editor (Medical):   Marland Kitchen Lack of Transportation (Non-Medical):   Physical Activity:   . Days of Exercise per Week:   . Minutes of Exercise  per Session:   Stress:   . Feeling of Stress :   Social Connections:   . Frequency of Communication with Friends and Family:   . Frequency of Social Gatherings with Friends and Family:   . Attends Religious Services:   . Active Member of Clubs or Organizations:   . Attends Club or Organization Meetings:   . Marital Status:      FAMILY HISTORY:  We obtained a detailed, 4-generation family history.  Significant diagnoses are listed below: Family History  Problem Relation Age of Onset  . Diabetes Mother        died @ 93 of MI.  . Hypertension Mother   . Heart attack Mother   . Melanoma Father 67       died of complications r/t melanoma w/ lung mets.  . Kidney disease Sister        Kidney removed   . Breast cancer Sister 60  . Prostate cancer Brother        Prostate - dx in 60's  . Heart disease Brother 38       reported MI @ age 38, ? treated w/ TPA->no recurrent CAD, now in 70's.  . Hearing loss Maternal Aunt   . Cancer Maternal Aunt        pancreatic vs colon cancer  . Hearing loss Maternal Grandfather   . Other Maternal Grandmother        flu pandemic  . Kidney cancer Niece 50       partial nephrectomy    The patient has two sons who are cancer free.  She has a sister and two brothers.  Her sister was diagnosed with  breast cancer at 60 and one brother had skin cancer.  The other brother was diagnosed with prostate cancer and he has a daughter with kidney cancer.  Both parents are deceased.  The patient's father had melanoma 3 times and died at 67.  He had 8 siblings who reportedly did not have cancer.  There are no other reported family history of cancer.  The patient's mother died of a heart attack at 93.  There are no other reports of cancer on the maternal side of the family.  Bailey Brooks is aware of previous family history of genetic testing for hereditary cancer risks. Patient's maternal ancestors are of [place] descent, and paternal ancestors are of [place] descent. There is no reported Ashkenazi Jewish ancestry. There is no known consanguinity.  GENETIC COUNSELING ASSESSMENT: Bailey Brooks is a 69 y.o. female with a personal and family history of cancer which is somewhat suggestive of a hereditary cancer syndrome and predisposition to cancer given the combination of breast and prostate cancer, as well as the combination of melanoma and kidney cancer. We, therefore, discussed and recommended the following at today's visit.   DISCUSSION: We discussed that 5 - 10% of breast cancer is hereditary, with most cases associated with BRCA mutations.  There are other genes that can be associated with hereditary breast cancer syndromes.  These include ATM, CHEK2 and PALB2 as well as others.  We also discussed that about 5-10% of melanoma is hereditary with most cases due to CDKN2A.  There are other genes associated with melanoma syndromes, one we see relatively commonly that is associated with melanoma and kidney cancer is MITF.  We discussed that testing is beneficial for several reasons including knowing how to follow individuals after completing their treatment, identifying whether potential treatment options such as PARP inhibitors would be   beneficial, and understand if other family members could be at risk for  cancer and allow them to undergo genetic testing.   We reviewed the characteristics, features and inheritance patterns of hereditary cancer syndromes. We also discussed genetic testing, including the appropriate family members to test, the process of testing, insurance coverage and turn-around-time for results. We discussed the implications of a negative, positive, carrier and/or variant of uncertain significant result. We recommended Bailey Brooks pursue genetic testing for the multi-cancer gene panel. The Multi-Gene Panel offered by [facility] includes sequencing and/or deletion duplication testing of the following 85 genes: AIP, ALK, APC, ATM, AXIN2,BAP1,  BARD1, BLM, BMPR1A, BRCA1, BRCA2, BRIP1, CASR, CDC73, CDH1, CDK4, CDKN1B, CDKN1C, CDKN2A (p14ARF), CDKN2A (p16INK4a), CEBPA, CHEK2, CTNNA1, DICER1, DIS3L2, EGFR (c.2369C>T, p.Thr790Met variant only), EPCAM (Deletion/duplication testing only), FH, FLCN, GATA2, GPC3, GREM1 (Promoter region deletion/duplication testing only), HOXB13 (c.251G>A, p.Gly84Glu), HRAS, KIT, MAX, MEN1, MET, MITF (c.952G>A, p.Glu318Lys variant only), MLH1, MSH2, MSH3, MSH6, MUTYH, NBN, NF1, NF2, NTHL1, PALB2, PDGFRA, PHOX2B, PMS2, POLD1, POLE, POT1, PRKAR1A, PTCH1, PTEN, RAD50, RAD51C, RAD51D, RB1, RECQL4, RET, RNF43, RUNX1, SDHAF2, SDHA (sequence changes only), SDHB, SDHC, SDHD, SMAD4, SMARCA4, SMARCB1, SMARCE1, STK11, SUFU, TERC, TERT, TMEM127, TP53, TSC1, TSC2, VHL, WRN and WT1.    Based on Bailey Brooks personal and family history of cancer, she meets medical criteria for genetic testing. Despite that she meets criteria, she may still have an out of pocket cost. We discussed that if her out of pocket cost for testing is over $100, the laboratory will call and confirm whether she wants to proceed with testing.  If the out of pocket cost of testing is less than $100 she will be billed by the genetic testing laboratory.   We discussed her diagnosis of lymphoma.  Depending on whether her  lymphoma was a stage I-III vs a stage IV will determine whether we can perform genetic testing through blood vs a skin biopsy.  Per the report, Bailey Brooks had a B cell lymphoma located in her liver.  Bailey Brooks reports having a Stage IV B lymphoma.  We have put genetic testing on hold until we can determine the type of lymphoma she had.  PLAN: After considering the risks, benefits, and limitations, Bailey Brooks provided informed consent to pursue genetic testing.  We have enquired with Dr. Rogue Bussing to determine the type of lymphoma she had.  Once determined, we will either draw blood and send the blood sample to [facility] for analysis of the multi-cancer panel. We may need to obtain a skin biopsy if her lymphoma was stage IV. Once obtained, results should be available within approximately 2-3 weeks' time, at which point they will be disclosed by telephone to Bailey Brooks, as will any additional recommendations warranted by these results. Bailey Brooks will receive a summary of her genetic counseling visit and a copy of her results once available. This information will also be available in Epic.   Lastly, we encouraged Bailey Brooks to remain in contact with cancer genetics annually so that we can continuously update the family history and inform her of any changes in cancer genetics and testing that may be of benefit for this family.   Bailey Brooks questions were answered to her satisfaction today. Our contact information was provided should additional questions or concerns arise. Thank you for the referral and allowing [place] to share in the care of your patient.   Vince Ainsley P. Florene Glen, [place], [facility] Licensed, Insurance risk surveyor [email or web address] phone: [phone]  The patient  was seen for a total of 45 minutes in face-to-face genetic counseling.  This patient was discussed with Drs. Magrinat, Gudena and/or Feng who agrees with the above.     _______________________________________________________________________ For Office Staff:  Number of people involved in session: 1 Was an Intern/ student involved with case: no   

## 2020-02-20 ENCOUNTER — Other Ambulatory Visit: Payer: BC Managed Care – PPO

## 2020-02-24 ENCOUNTER — Ambulatory Visit: Payer: BC Managed Care – PPO | Attending: Internal Medicine

## 2020-02-24 DIAGNOSIS — Z23 Encounter for immunization: Secondary | ICD-10-CM

## 2020-02-24 NOTE — Progress Notes (Signed)
   Covid-19 Vaccination Clinic  Name:  BLAKE CUBILLAS    MRN: [id number] DOB: 1951/04/23  02/24/2020  Ms. Jungbluth was observed post Covid-19 immunization for 15 minutes without incident. She was provided with Vaccine Information Sheet and instruction to access the V-Safe system.   Ms. Cordell was instructed to call 911 with any severe reactions post vaccine: Marland Kitchen Difficulty breathing  . Swelling of face and throat  . A fast heartbeat  . A bad rash all over body  . Dizziness and weakness   Immunizations Administered    Name Date Dose VIS Date Route   Pfizer COVID-19 Vaccine 02/24/2020  9:14 AM 0.3 mL 11/14/2019 Intramuscular   Manufacturer: [place]   Lot: [id number]   [place]: [id number]

## 2020-02-26 ENCOUNTER — Encounter: Payer: BC Managed Care – PPO | Admitting: Licensed Clinical Social Worker

## 2020-02-26 ENCOUNTER — Other Ambulatory Visit: Payer: Self-pay | Admitting: Genetic Counselor

## 2020-03-01 ENCOUNTER — Encounter: Payer: Self-pay | Admitting: Internal Medicine

## 2020-03-02 ENCOUNTER — Other Ambulatory Visit: Payer: Self-pay | Admitting: Family Medicine

## 2020-03-04 ENCOUNTER — Inpatient Hospital Stay: Payer: BC Managed Care – PPO | Attending: Nurse Practitioner | Admitting: Nurse Practitioner

## 2020-03-04 ENCOUNTER — Encounter: Payer: Self-pay | Admitting: Nurse Practitioner

## 2020-03-04 ENCOUNTER — Inpatient Hospital Stay: Payer: BC Managed Care – PPO

## 2020-03-04 ENCOUNTER — Other Ambulatory Visit: Payer: Self-pay

## 2020-03-04 VITALS — BP 126/70 | HR 72 | Temp 97.9°F | Resp 20 | Wt 237.0 lb

## 2020-03-04 DIAGNOSIS — C50911 Malignant neoplasm of unspecified site of right female breast: Secondary | ICD-10-CM | POA: Insufficient documentation

## 2020-03-04 DIAGNOSIS — Z79811 Long term (current) use of aromatase inhibitors: Secondary | ICD-10-CM | POA: Diagnosis not present

## 2020-03-04 DIAGNOSIS — Z8572 Personal history of non-Hodgkin lymphomas: Secondary | ICD-10-CM | POA: Diagnosis not present

## 2020-03-04 DIAGNOSIS — C519 Malignant neoplasm of vulva, unspecified: Secondary | ICD-10-CM | POA: Insufficient documentation

## 2020-03-04 DIAGNOSIS — C4499 Other specified malignant neoplasm of skin, unspecified: Secondary | ICD-10-CM | POA: Diagnosis not present

## 2020-03-04 DIAGNOSIS — Z809 Family history of malignant neoplasm, unspecified: Secondary | ICD-10-CM | POA: Diagnosis not present

## 2020-03-04 DIAGNOSIS — Z79899 Other long term (current) drug therapy: Secondary | ICD-10-CM | POA: Insufficient documentation

## 2020-03-04 DIAGNOSIS — Z8042 Family history of malignant neoplasm of prostate: Secondary | ICD-10-CM | POA: Diagnosis not present

## 2020-03-04 DIAGNOSIS — Z170 Estrogen receptor positive status [ER+]: Secondary | ICD-10-CM | POA: Insufficient documentation

## 2020-03-04 DIAGNOSIS — Z803 Family history of malignant neoplasm of breast: Secondary | ICD-10-CM | POA: Diagnosis not present

## 2020-03-04 NOTE — Progress Notes (Signed)
Gynecologic Oncology Interval Visit   Referring Provider: Dr. Kenton Kingfisher  Chief Concern: Recurrent paget's disease of the vulva  Subjective:  Bailey Brooks is a 69 y.o. female, diagnosed with Paget's of the vulva, s/p WLE and vulvar biopsy 09/29/2015, who returns to clinic to re-evaluation.   She had started imiquimod previously but has been holding treatment due to previous pain and ulceration. Today, she says that pain has resolved. She has not been using any topicals in the interim.    Gynecologic Oncology History: Bailey Brooks has a history of localized vulvar Paget's disease.   04/2013- vulvar biopsy revealed Paget's disease               WLE, additional margins resected for positive disease on frozen evaluation.   Part A: VULVA, VAGINAL MARGIN:  - POSITIVE FOR EXTRAMAMMARY PAGET'S DISEASE  Part B: VULVA, POSTERIOR MARGIN:  - POSITIVE FOR EXTRAMAMMARY PAGET'S DISEASE  Part C: VULVA, RIGHT, PARTIAL VULVECTOMY:  - EXTRAMAMMARY PAGET'S DISEASE  Part D: VULVA, NEW VAGINAL MARGIN:  - POSITIVE FOR EXTRAMAMMARY PAGET'S DISEASE  07/28/2015 for routine surveillance an area on the right vulva seemed more suspicious for recurrence. This was biopsied and confirmed recurrent Paget's disease.   She underwent repeat excision 09/29/15 A. VULVA; EXCISION:  - RARE CYTOKERATIN 7 POSITIVE INTRAEPIDERMAL CELLS, INTERPRETED AS RESIDUAL PAGET DISEASE.  - THE SURGICAL MARGINS ARE CLEAR.   B. VULVA, 11:00; BIOPSY:  - RARE CYTOKERATIN 7 POSITIVE INTRAEPIDERMAL CELLS, INTERPRETED AS PAGET DISEASE.   No additional issues since surgery.   She had vulvar biopsy on 05/09/17 which was negative:   DIAGNOSIS:  A. VULVA; BIOPSY:  - SKIN WITH FOCAL KERATOSIS AND HYPERGRANULOSIS - NEGATIVE FOR DYSPLASIA AND MALIGNANCY  Of note, she has had a h/o recurrent vulvar candidiasis. Prior to her vulvar surgery 09/2015 she received 5 weekly doses of oral diflucan for vulvar candidiasis. Postop she was treated for  yeast infection again in 12/17 and then took one Diflucan a week for six months with good results.   Seen in gyn-onc clinic on 01/16/18 by Dr. Theora Gianotti. NED at that time.   She has swelling of her lower extremities which was evaluated by her PCP and thought to be related to amlodipine. She started lasix which has improved her symptoms. She was seen by Dr. 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   She saw Dr. Theora Gianotti on 01/29/2019 with symptoms concerning for candidiasis at that time but no evidence of recurrent disease.   Her blood pressure medications were recently changes due to swelling secondary to amlodipine. No on lisinopril.   Previous biopsies were consistent with recurrent Paget's.   DIAGNOSIS:  A. VULVA, LEFT AT 3:00; PUNCH BIOPSY:  - BENIGN VULVAR TISSUE.  - NEGATIVE FOR DYSPLASIA AND MALIGNANCY.  DIAGNOSIS:  A. VULVA, RIGHT AT 7:00; PUNCH BIOPSY:  - EXTRAMAMMARY PAGET'S DISEASE OF THE VULVA.  - NEGATIVE FOR DYSPLASIA AND MALIGNANCY.   In the interim she has completed surgery and radiation for stage I right breast cancer, ER/PR positive HER-2/neu negative. She completed radiation on 12/29/2019. Anastrozole was recommended and she plans to start in approximately late February.   At last visit we had recommended start Aldara but due to recent diagnosis of breast cancer at that time, patient requested to wait to start treatment and returns today to discuss options for management and consider starting Aldara. She is concerned about reaching site to be treated and tolerability.  Her daughter-in-law is going to help her with application. Previously she had expressed desire to avoid surgery/Mohs.   Problem List: Patient Active Problem List   Diagnosis Date Noted  . Family history of breast cancer   . Family history of prostate cancer   . Family history of melanoma   . Carcinoma of upper-outer quadrant of right breast in female,  estrogen receptor positive ([place]) 10/28/2019  . Stress 08/06/2019  . Breast cancer screening 08/06/2019  . Pain due to onychomycosis of toenails of both feet 06/09/2019  . Prediabetes 12/20/2018  . Macular degeneration 06/20/2018  . GERD (gastroesophageal reflux disease) 06/20/2018  . Hypersomnia 06/20/2018  . Chronic pain of right knee 06/20/2018  . Bruising 12/14/2017  . 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 Diffuse large b-cell lymphoma, intra-abdominal lymph nodes ([place]) 06/11/2017  . Vulvar lesion 05/16/2017  . Low back pain 04/03/2017  . History of transient ischemic attack (TIA) 02/10/2017  . Hypokalemia 02/10/2017  . Exertional shortness of breath 11/06/2016  . Right hip pain 08/03/2016  . Essential hypertension 07/06/2016  . Left knee pain 06/28/2016  . Hypomagnesemia 03/02/2016  . Morbid obesity due to excess calories ([place]) 03/02/2016  . 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  . Adjustment disorder with mixed anxiety and depressed mood 05/25/2015  . 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 Eczema 02/27/2013  . Muscle cramps 11/13/2012  . Personal history of lymphoma 02/20/2012  . Hypothyroidism 02/20/2012  . Hyperlipidemia 02/20/2012    Past Medical History: Past Medical History:  Diagnosis Date  . 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 Allergic rhinitis   . Anemia    WHILE GOING THRU CHEMO  . Arthritis   . CHF (congestive heart failure) ([place])    PT DENIES  . Diastolic dysfunction    a. 11/2016 Echo: EF 60-65%, no rwma, mild LVH, Gr1 DD, triv MR, mildly dil LA, nl RV fxn.  . Diffuse large B cell lymphoma [facility]) Nov 2011   Dr Inez Pilgrim, Dr. Madelynn Done s/p RCHOP and methotrexate, c/b renal failure  . Diverticulosis   . Esophagitis   . Family history of breast cancer   . Family history of melanoma   . Family history of prostate cancer   . GERD (gastroesophageal  reflux disease)   . H/O stem cell transplant ([place]) 06/2011   a. in setting of lymphoma.  . Hemorrhoids   . History of chemotherapy 22-Oct-2010   RHCOP/methotrexate-intrathecal  . History of stress test    a. 05/2010 Myoview: nl EF, no ischemia/infarct.  . Hypercholesterolemia   . Hypertension   . Hypothyroidism   . Morbid obesity ([place])   . 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 Paget disease, extra mammary    vulva, s/p resection 2014, Dr. Sabra Heck  . Pagets disease, extra-mammary   . TIA (transient ischemic attack)    X 2 -LAST ONE IN 2017    Past Surgical History: Past Surgical History:  Procedure Laterality Date  . ABDOMINAL HYSTERECTOMY  1985   Hysterectomy-partial  . BREAST BIOPSY  2002   Neg - AT [facility]  . BURR HOLE W/ PLACEMENT OMMAYA RESERVOIR    . BURR HOLE W/ PLACEMENT OMMAYA RESERVOIR  2012  . COLONOSCOPY  04/2013  . ESOPHAGOGASTRODUODENOSCOPY  04/2013  . INJECTION KNEE  07/02/2018  . INSERTION CENTRAL VENOUS ACCESS DEVICE W/ SUBCUTANEOUS PORT  2011  Port a Cath: Right chest Double Lumen, 04-Nov-2010  . LIMBAL STEM CELL TRANSPLANT  2012  . LIVER BIOPSY     stage 4B large Bcell lymphoma  . PARTIAL MASTECTOMY WITH NEEDLE LOCALIZATION AND AXILLARY SENTINEL LYMPH NODE BX Right 10/16/2019   Procedure: PARTIAL MASTECTOMY WITH NEEDLE LOCALIZATION AND AXILLARY SENTINEL LYMPH NODE BX;  Surgeon: Benjamine Sprague, DO;  Location: [facility] ORS;  Service: General;  Laterality: Right;  . ROTATOR CUFF REPAIR Right 2016  . SHOULDER ARTHROSCOPY Right 06/22/2015   Procedure: ARTHROSCOPY SHOULDER, parital repair of rotator cuff, biceps tenodesis, decompression and debridement;  Surgeon: Corky Mull, MD;  Location: [facility] ORS;  Service: Orthopedics;  Laterality: Right;  . TONSILLECTOMY  1954  . VULVECTOMY    . VULVECTOMY PARTIAL N/A 09/29/2015   Procedure: VULVECTOMY PARTIAL;  Surgeon: Gillis Ends, MD;  Location: [facility] ORS;  Service: Gynecology;  Laterality: N/A;    Past  Gynecologic History:  S/p hysterectomy  OB History:  OB History  No obstetric history on file.    Family History: Family History  Problem Relation Age of Onset  . Diabetes Mother        died @ 47 of MI.  Marland Kitchen Hypertension Mother   . Heart attack Mother   . Melanoma Father 69       died of complications r/t melanoma w/ lung mets.  . Kidney disease Sister        Kidney removed   . Breast cancer Sister 74  . Prostate cancer Brother        Prostate - dx in 26's  . Heart disease Brother 89       reported MI @ age 100, ? treated w/ TPA->no recurrent CAD, now in 81's.  Marland Kitchen Hearing loss Maternal Aunt   . Cancer Maternal Aunt        pancreatic vs colon cancer  . Hearing loss Maternal Grandfather   . Other Maternal Grandmother        flu pandemic  . Kidney cancer Niece 70       partial nephrectomy    Social History: Social History   Socioeconomic History  . Marital status: Married    Spouse name: Not on file  . Number of children: 2  . Years of education: Not on file  . Highest education level: Not on file  Occupational History  . 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 Smoking status: Never Smoker  . Smokeless tobacco: Never Used  Substance and Sexual Activity  . Alcohol use: No    Alcohol/week: 0.0 standard drinks    Comment: former user-no current use  . Drug use: No  . Sexual activity: Yes    Partners: Male    Birth control/protection: Post-menopausal  Other Topics Concern  . Not on file  Social History Narrative   Lives in [place] with husband, has 2 grown sons.  No pets. 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   Right-handed   Caffeine: occasional caffeine free/diet soda or hot tea   Social Determinants of Health   Financial Resource Strain:   . Difficulty of Paying Living Expenses:   Food Insecurity:   . Worried About Charity fundraiser in the Last Year:   . Arboriculturist in the Last Year:   Transportation Needs:   .  Film/video editor (Medical):   Marland Kitchen Lack of Transportation (Non-Medical):   Physical Activity:   . Days  of Exercise per Week:   . Minutes of Exercise per Session:   Stress:   . Feeling of Stress :   Social Connections:   . Frequency of Communication with Friends and Family:   . Frequency of Social Gatherings with Friends and Family:   . Attends Religious Services:   . Active Member of Clubs or Organizations:   . Attends Archivist Meetings:   Marland Kitchen Marital Status:   Intimate Partner Violence:   . Fear of Current or Ex-Partner:   . Emotionally Abused:   Marland Kitchen Physically Abused:   . Sexually Abused:     Allergies: Allergies  Allergen Reactions  . Erythromycin Nausea And Vomiting  . Iodine Swelling    IV iodine (states now that this can be tolerated with Benadryl)  . Morphine And Related Nausea And Vomiting and Nausea Only    Hallucinations  . Oxycodone Nausea And Vomiting  . Sulfa Antibiotics Other (See Comments)    Dizzy/Fainting  . Adhesive [Tape] Rash and Other (See Comments)    Including Bandaids    Current Medications: Current Outpatient Medications  Medication Sig Dispense Refill  . anastrozole (ARIMIDEX) 1 MG tablet Take 1 tablet (1 mg total) by mouth daily. 30 tablet 6  . aspirin EC 81 MG tablet Take 81 mg by mouth daily.    . chlorthalidone (HYGROTON) 25 MG tablet TAKE 1/2 TABLET(12.5 MG) BY MOUTH DAILY 45 tablet 1  . cholecalciferol (VITAMIN D3) 25 MCG (1000 UT) tablet Take 1,000 Units by mouth daily.    . clobetasol cream (TEMOVATE) 5.05 % Apply 1 application topically daily as needed (psoriasis).     . ezetimibe (ZETIA) 10 MG tablet Take 1 tablet (10 mg total) by mouth daily. 90 tablet 3  . lansoprazole (PREVACID) 30 MG capsule TAKE 1 CAPSULE(30 MG) BY MOUTH DAILY 90 capsule 1  . levothyroxine (SYNTHROID) 88 MCG tablet take 1 tablet by mouth every morning ON AN EMPTY STOMACH (Patient taking differently: Take 88 mcg by mouth daily before breakfast. ) 90  tablet 3  . lisinopril (ZESTRIL) 10 MG tablet TAKE 1 TABLET(10 MG) BY MOUTH DAILY 90 tablet 3  . mometasone (ELOCON) 0.1 % cream Apply 1 application topically daily as needed (eczema).   0  . Multiple Vitamin (MULTIVITAMIN) tablet Take 1 tablet by mouth daily.    Marland Kitchen Specialty Vitamins Products (ICAPS LUTEIN & ZEAXANTHIN PO) Take by mouth every morning. Zeaxanthin 17m/Lutein 163m    . imiquimod (ALDARA) 5 % cream Apply topically 3 (three) times a week. (Patient not taking: Reported on 03/04/2020) 12 each 1   No current facility-administered medications for this visit.   Review of Systems General:  no complaints Skin: no complaints Eyes: no complaints HEENT: no complaints Breasts: no complaints Pulmonary: no complaints Cardiac: no complaints Gastrointestinal: no complaints Genitourinary/Sexual: no complaints Ob/Gyn: no complaints Musculoskeletal: no complaints Hematology: no complaints Neurologic/Psych: no complaints  Objective:  Physical Examination:  BP 126/70 (BP Location: Right Wrist, Patient Position: Sitting)   Pulse 72   Temp 97.9 F (36.6 C) (Tympanic)   Resp 20   Wt 237 lb (107.5 kg)   BMI 41.98 kg/m    Body mass index is 41.98 kg/m.  ECOG Performance Status: 0 - Asymptomatic   GENERAL: Patient is a well appearing female in no acute distress HEENT:  Sclera clear. Anicteric EXTREMITIES:  No peripheral edema. Atraumatic. No cyanosis NEURO:  Nonfocal. Well oriented.  Appropriate affect.  Pelvic: Exam Chaperoned by RN Vulva: vulvar  scars are well healed from prior resections. Sharp demarcation between mucosa/vulvar tissue and epithelial tissue on the right. Stable compared to prior exams. Left 8:00 ulceration has resolved. Left labia major appears more erythematous compared to prior exam. No ulceration, pain, or tenderness. Speculum and bimanual exams deferred.   03/04/20 Exam   02/18/20 Exam      Assessment:  Bailey Brooks is a 69 y.o. female diagnosed with  recurrent Paget's disease of the vulva status post wide local excision with positive margins and subsequent recurrence in 07/2015.  Repeat wide local excision consistent with recurrent Paget's disease of the vulva status post wide local excision with negative margins.  Biopsy in 2018 was negative.  Biopsy in 2020 at 7:00 consistent with recurrent Paget's.  She deffered prior treatment due to breast cancer diagnosis and need for therapy. She has now completed treatment and she is ready to focus on management of Paget's disease.  She suffered ulceration & pain after 5 weeks of treatment and has now been on treatment break for 2 weeks. Exam today reveals ulceration has resolved but persistent erythema.   History of recurrent vulvar candidiasis-resolved after 59-monthcourse of Diflucan.  No evidence of recurrent candidiasis on exam and asymptomatic today.  Right breast cancer-newly diagnosed with right breast cancer status post biopsy.  Plans for right lumpectomy on 10/16/2019 with Dr. SLysle Pearl  She is followed by medical oncology/Dr. BRogue Bussingfor possible adjuvant treatment.      Plan:   Problem List Items Addressed This Visit      Musculoskeletal and Integument   Paget disease, extra mammary - Primary (Chronic)     Continue to hold treatment in view of erythema to allow further healing. Apply topical emollient as discussed. Return to clinic in 2-3 weeks for re-evaluation and consideration of restarting treatment at that time. When she restarts treatment will need to reinforce liberal application of emollient to left vulva/non-treatment areas.   If intolerant to treatment or refractory symptoms could consider vulvectomy with plastic surgery for skin graft versus Mohs surgery versus continued close surveillance. She had previously expressed her desires to avoid invasive procedures if possible.  Due to diagnosis of breast cancer after screening mammogram she is currently deferred screening colonoscopy.   Encouraged her to have this performed in the future.  Patient does request female providers only.  A total of (15) minutes of face-to-face time was spent with this patient with greater than 50% of that time in counseling and care-coordination.  LBeckey Rutter DNP, AGNP-C [id number](clinic)  CC: Dr. STheora Gianotti

## 2020-03-09 DIAGNOSIS — H353131 Nonexudative age-related macular degeneration, bilateral, early dry stage: Secondary | ICD-10-CM | POA: Diagnosis not present

## 2020-03-17 ENCOUNTER — Inpatient Hospital Stay (HOSPITAL_BASED_OUTPATIENT_CLINIC_OR_DEPARTMENT_OTHER): Payer: BC Managed Care – PPO | Admitting: Nurse Practitioner

## 2020-03-17 ENCOUNTER — Other Ambulatory Visit: Payer: Self-pay

## 2020-03-17 VITALS — BP 135/78 | HR 82 | Temp 99.1°F | Resp 20 | Wt 236.7 lb

## 2020-03-17 DIAGNOSIS — Z79811 Long term (current) use of aromatase inhibitors: Secondary | ICD-10-CM | POA: Diagnosis not present

## 2020-03-17 DIAGNOSIS — C4499 Other specified malignant neoplasm of skin, unspecified: Secondary | ICD-10-CM | POA: Diagnosis not present

## 2020-03-17 DIAGNOSIS — C50911 Malignant neoplasm of unspecified site of right female breast: Secondary | ICD-10-CM | POA: Diagnosis not present

## 2020-03-17 DIAGNOSIS — Z79899 Other long term (current) drug therapy: Secondary | ICD-10-CM | POA: Diagnosis not present

## 2020-03-17 DIAGNOSIS — C519 Malignant neoplasm of vulva, unspecified: Secondary | ICD-10-CM | POA: Diagnosis not present

## 2020-03-17 DIAGNOSIS — Z170 Estrogen receptor positive status [ER+]: Secondary | ICD-10-CM | POA: Diagnosis not present

## 2020-03-17 NOTE — Progress Notes (Signed)
Gynecologic Oncology Interval Visit   Referring Provider: Dr. Kenton Kingfisher  Chief Concern: Recurrent paget's disease of the vulva  Subjective:  Bailey Brooks is a 69 y.o. female, diagnosed with Paget's of the vulva, s/p WLE and vulvar biopsy 09/29/2015, who returns to clinic to re-evaluation.   Imiquimod held since 02/18/2020 due to ulceration, erythema, and pain. Prior to then, she had completed 5 weeks of treatment, started around 01/16/20. Today, she has been using topical desitin to irritated/reddened skin. Denies pain. Feels redness has improved.    Gynecologic Oncology History: Bailey Brooks has a history of localized vulvar Paget's disease.   04/2013- vulvar biopsy revealed Paget's disease               WLE, additional margins resected for positive disease on frozen evaluation.   Part A: VULVA, VAGINAL MARGIN:  - POSITIVE FOR EXTRAMAMMARY PAGET'S DISEASE  Part B: VULVA, POSTERIOR MARGIN:  - POSITIVE FOR EXTRAMAMMARY PAGET'S DISEASE  Part C: VULVA, RIGHT, PARTIAL VULVECTOMY:  - EXTRAMAMMARY PAGET'S DISEASE  Part D: VULVA, NEW VAGINAL MARGIN:  - POSITIVE FOR EXTRAMAMMARY PAGET'S DISEASE  07/28/2015 for routine surveillance an area on the right vulva seemed more suspicious for recurrence. This was biopsied and confirmed recurrent Paget's disease.   She underwent repeat excision 09/29/15 A. VULVA; EXCISION:  - RARE CYTOKERATIN 7 POSITIVE INTRAEPIDERMAL CELLS, INTERPRETED AS RESIDUAL PAGET DISEASE.  - THE SURGICAL MARGINS ARE CLEAR.   B. VULVA, 11:00; BIOPSY:  - RARE CYTOKERATIN 7 POSITIVE INTRAEPIDERMAL CELLS, INTERPRETED AS PAGET DISEASE.   No additional issues since surgery.   She had vulvar biopsy on 05/09/17 which was negative:   DIAGNOSIS:  A. VULVA; BIOPSY:  - SKIN WITH FOCAL KERATOSIS AND HYPERGRANULOSIS - NEGATIVE FOR DYSPLASIA AND MALIGNANCY  Of note, she has had a h/o recurrent vulvar candidiasis. Prior to her vulvar surgery 09/2015 she received 5 weekly doses of  oral diflucan for vulvar candidiasis. Postop she was treated for yeast infection again in 12/17 and then took one Diflucan a week for six months with good results.   Seen in gyn-onc clinic on 01/16/18 by Dr. Theora Gianotti. NED at that time.   She has swelling of her lower extremities which was evaluated by her PCP and thought to be related to amlodipine. She started lasix which has improved her symptoms. She was seen by Dr. 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   She saw Dr. Theora Gianotti on 01/29/2019 with symptoms concerning for candidiasis at that time but no evidence of recurrent disease.   Her blood pressure medications were recently changes due to swelling secondary to amlodipine. No on lisinopril.   Previous biopsies were consistent with recurrent Paget's.   DIAGNOSIS:  A. VULVA, LEFT AT 3:00; PUNCH BIOPSY:  - BENIGN VULVAR TISSUE.  - NEGATIVE FOR DYSPLASIA AND MALIGNANCY.  DIAGNOSIS:  A. VULVA, RIGHT AT 7:00; PUNCH BIOPSY:  - EXTRAMAMMARY PAGET'S DISEASE OF THE VULVA.  - NEGATIVE FOR DYSPLASIA AND MALIGNANCY.   In the interim she has completed surgery and radiation for stage I right breast cancer, ER/PR positive HER-2/neu negative. She completed radiation on 12/29/2019. Anastrozole was recommended and she plans to start in approximately late February.   At last visit we had recommended start Aldara but due to recent diagnosis of breast cancer at that time, patient requested to wait to start treatment and returns today to discuss options for management and consider starting Aldara. She is concerned about reaching  site to be treated and tolerability. Her daughter-in-law is going to help her with application. Previously she had expressed desire to avoid surgery/Mohs.   Problem List: Patient Active Problem List   Diagnosis Date Noted  . Family history of breast cancer   . Family history of prostate cancer   . Family history of melanoma    . Carcinoma of upper-outer quadrant of right breast in female, estrogen receptor positive ([place]) 10/28/2019  . Stress 08/06/2019  . Breast cancer screening 08/06/2019  . Pain due to onychomycosis of toenails of both feet 06/09/2019  . Prediabetes 12/20/2018  . Macular degeneration 06/20/2018  . GERD (gastroesophageal reflux disease) 06/20/2018  . Hypersomnia 06/20/2018  . Chronic pain of right knee 06/20/2018  . Bruising 12/14/2017  . 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 Diffuse large b-cell lymphoma, intra-abdominal lymph nodes ([place]) 06/11/2017  . Vulvar lesion 05/16/2017  . Low back pain 04/03/2017  . History of transient ischemic attack (TIA) 02/10/2017  . Hypokalemia 02/10/2017  . Exertional shortness of breath 11/06/2016  . Right hip pain 08/03/2016  . Essential hypertension 07/06/2016  . Left knee pain 06/28/2016  . Hypomagnesemia 03/02/2016  . Morbid obesity due to excess calories ([place]) 03/02/2016  . 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  . Adjustment disorder with mixed anxiety and depressed mood 05/25/2015  . 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 Eczema 02/27/2013  . Muscle cramps 11/13/2012  . Personal history of lymphoma 02/20/2012  . Hypothyroidism 02/20/2012  . Hyperlipidemia 02/20/2012    Past Medical History: Past Medical History:  Diagnosis Date  . 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 Allergic rhinitis   . Anemia    WHILE GOING THRU CHEMO  . Arthritis   . CHF (congestive heart failure) ([place])    PT DENIES  . Diastolic dysfunction    a. 11/2016 Echo: EF 60-65%, no rwma, mild LVH, Gr1 DD, triv MR, mildly dil LA, nl RV fxn.  . Diffuse large B cell lymphoma [facility]) Nov 2011   Dr Inez Pilgrim, Dr. Madelynn Done s/p RCHOP and methotrexate, c/b renal failure  . Diverticulosis   . Esophagitis   . Family history of breast cancer   . Family history of melanoma     . Family history of prostate cancer   . GERD (gastroesophageal reflux disease)   . H/O stem cell transplant ([place]) 06/2011   a. in setting of lymphoma.  . Hemorrhoids   . History of chemotherapy 22-Oct-2010   RHCOP/methotrexate-intrathecal  . History of stress test    a. 05/2010 Myoview: nl EF, no ischemia/infarct.  . Hypercholesterolemia   . Hypertension   . Hypothyroidism   . Morbid obesity ([place])   . 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 Paget disease, extra mammary    vulva, s/p resection 2014, Dr. Sabra Heck  . Pagets disease, extra-mammary   . TIA (transient ischemic attack)    X 2 -LAST ONE IN 2017    Past Surgical History: Past Surgical History:  Procedure Laterality Date  . ABDOMINAL HYSTERECTOMY  1985   Hysterectomy-partial  . BREAST BIOPSY  2002   Neg - AT [facility]  . BURR HOLE W/ PLACEMENT OMMAYA RESERVOIR    . BURR HOLE W/ PLACEMENT OMMAYA RESERVOIR  2012  . COLONOSCOPY  04/2013  . ESOPHAGOGASTRODUODENOSCOPY  04/2013  . INJECTION KNEE  07/02/2018  . INSERTION CENTRAL VENOUS ACCESS  DEVICE W/ SUBCUTANEOUS PORT  2011   Port a Cath: Right chest Double Lumen, 04-Nov-2010  . LIMBAL STEM CELL TRANSPLANT  2012  . LIVER BIOPSY     stage 4B large Bcell lymphoma  . PARTIAL MASTECTOMY WITH NEEDLE LOCALIZATION AND AXILLARY SENTINEL LYMPH NODE BX Right 10/16/2019   Procedure: PARTIAL MASTECTOMY WITH NEEDLE LOCALIZATION AND AXILLARY SENTINEL LYMPH NODE BX;  Surgeon: Benjamine Sprague, DO;  Location: [facility] ORS;  Service: General;  Laterality: Right;  . ROTATOR CUFF REPAIR Right 2016  . SHOULDER ARTHROSCOPY Right 06/22/2015   Procedure: ARTHROSCOPY SHOULDER, parital repair of rotator cuff, biceps tenodesis, decompression and debridement;  Surgeon: Corky Mull, MD;  Location: [facility] ORS;  Service: Orthopedics;  Laterality: Right;  . TONSILLECTOMY  1954  . VULVECTOMY    . VULVECTOMY PARTIAL N/A 09/29/2015   Procedure: VULVECTOMY PARTIAL;  Surgeon: Gillis Ends, MD;   Location: [facility] ORS;  Service: Gynecology;  Laterality: N/A;    Past Gynecologic History:  S/p hysterectomy  OB History:  OB History  No obstetric history on file.    Family History: Family History  Problem Relation Age of Onset  . Diabetes Mother        died @ 86 of MI.  Marland Kitchen Hypertension Mother   . Heart attack Mother   . Melanoma Father 76       died of complications r/t melanoma w/ lung mets.  . Kidney disease Sister        Kidney removed   . Breast cancer Sister 91  . Prostate cancer Brother        Prostate - dx in 4's  . Heart disease Brother 11       reported MI @ age 29, ? treated w/ TPA->no recurrent CAD, now in 73's.  Marland Kitchen Hearing loss Maternal Aunt   . Cancer Maternal Aunt        pancreatic vs colon cancer  . Hearing loss Maternal Grandfather   . Other Maternal Grandmother        flu pandemic  . Kidney cancer Niece 4       partial nephrectomy    Social History: Social History   Socioeconomic History  . Marital status: Married    Spouse name: Not on file  . Number of children: 2  . Years of education: Not on file  . Highest education level: Not on file  Occupational History  . 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 Smoking status: Never Smoker  . Smokeless tobacco: Never Used  Substance and Sexual Activity  . Alcohol use: No    Alcohol/week: 0.0 standard drinks    Comment: former user-no current use  . Drug use: No  . Sexual activity: Yes    Partners: Male    Birth control/protection: Post-menopausal  Other Topics Concern  . Not on file  Social History Narrative   Lives in [place] with husband, has 2 grown sons.  No pets. 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   Right-handed   Caffeine: occasional caffeine free/diet soda or hot tea   Social Determinants of Health   Financial Resource Strain:   . Difficulty of Paying Living Expenses:   Food Insecurity:   . Worried About Charity fundraiser in the Last  Year:   . Arboriculturist in the Last Year:   Transportation Needs:   . Film/video editor (Medical):   Marland Kitchen Lack of Transportation (Non-Medical):  Physical Activity:   . Days of Exercise per Week:   . Minutes of Exercise per Session:   Stress:   . Feeling of Stress :   Social Connections:   . Frequency of Communication with Friends and Family:   . Frequency of Social Gatherings with Friends and Family:   . Attends Religious Services:   . Active Member of Clubs or Organizations:   . Attends Archivist Meetings:   Marland Kitchen Marital Status:   Intimate Partner Violence:   . Fear of Current or Ex-Partner:   . Emotionally Abused:   Marland Kitchen Physically Abused:   . Sexually Abused:     Allergies: Allergies  Allergen Reactions  . Erythromycin Nausea And Vomiting  . Iodine Swelling    IV iodine (states now that this can be tolerated with Benadryl)  . Morphine And Related Nausea And Vomiting and Nausea Only    Hallucinations  . Oxycodone Nausea And Vomiting  . Sulfa Antibiotics Other (See Comments)    Dizzy/Fainting  . Adhesive [Tape] Rash and Other (See Comments)    Including Bandaids    Current Medications: Current Outpatient Medications  Medication Sig Dispense Refill  . anastrozole (ARIMIDEX) 1 MG tablet Take 1 tablet (1 mg total) by mouth daily. 30 tablet 6  . aspirin EC 81 MG tablet Take 81 mg by mouth daily.    . chlorthalidone (HYGROTON) 25 MG tablet TAKE 1/2 TABLET(12.5 MG) BY MOUTH DAILY 45 tablet 1  . cholecalciferol (VITAMIN D3) 25 MCG (1000 UT) tablet Take 1,000 Units by mouth daily.    . clobetasol cream (TEMOVATE) 5.79 % Apply 1 application topically daily as needed (psoriasis).     . ezetimibe (ZETIA) 10 MG tablet Take 1 tablet (10 mg total) by mouth daily. 90 tablet 3  . imiquimod (ALDARA) 5 % cream Apply topically 3 (three) times a week. (Patient not taking: Reported on 03/04/2020) 12 each 1  . lansoprazole (PREVACID) 30 MG capsule TAKE 1 CAPSULE(30 MG) BY MOUTH  DAILY 90 capsule 1  . levothyroxine (SYNTHROID) 88 MCG tablet take 1 tablet by mouth every morning ON AN EMPTY STOMACH (Patient taking differently: Take 88 mcg by mouth daily before breakfast. ) 90 tablet 3  . lisinopril (ZESTRIL) 10 MG tablet TAKE 1 TABLET(10 MG) BY MOUTH DAILY 90 tablet 3  . mometasone (ELOCON) 0.1 % cream Apply 1 application topically daily as needed (eczema).   0  . Multiple Vitamin (MULTIVITAMIN) tablet Take 1 tablet by mouth daily.    Marland Kitchen Specialty Vitamins Products (ICAPS LUTEIN & ZEAXANTHIN PO) Take by mouth every morning. Zeaxanthin 57m/Lutein 187m     No current facility-administered medications for this visit.   Review of Systems General:  no complaints Skin: no complaints Eyes: no complaints HEENT: no complaints Breasts: no complaints Pulmonary: no complaints Cardiac: no complaints Gastrointestinal: no complaints Genitourinary/Sexual: no complaints Ob/Gyn: no complaints Musculoskeletal: no complaints Hematology: no complaints Neurologic/Psych: no complaints   Objective:  Physical Examination:  BP 135/78 (BP Location: Left Wrist, Patient Position: Sitting)   Pulse 82   Temp 99.1 F (37.3 C) (Tympanic)   Resp 20   Wt 236 lb 11.2 oz (107.4 kg)   SpO2 98%   BMI 41.93 kg/m    Body mass index is 41.93 kg/m.  ECOG Performance Status: 0 - Asymptomatic   GENERAL: Patient is a well appearing female in no acute distress HEENT:  Sclera clear. Anicteric EXTREMITIES:  No peripheral edema. Atraumatic. No cyanosis NEURO:  Nonfocal.  Well oriented.  Appropriate affect.  Pelvic: Exam Chaperoned by RN Vulva: vulvar scars are well healed from prior resections. Sharp demarcation between mucosa/vulvar tissue and epithelial tissue on the right. Stable compared to prior exams. Left 8:00 ulceration has  Resolved. Erythema of left labia majora has resolved. No ulceration, pain, or tenderness. Speculum and bimanual exams deferred.   03/04/20 Exam   02/18/20 Exam       Assessment:  Bailey Brooks is a 69 y.o. female diagnosed with recurrent Paget's disease of the vulva status post wide local excision with positive margins and subsequent recurrence in 07/2015.  Repeat wide local excision consistent with recurrent Paget's disease of the vulva status post wide local excision with negative margins.  Biopsy in 2018 was negative.  Biopsy in 2020 at 7:00 consistent with recurrent Paget's.  She deffered prior treatment due to breast cancer diagnosis and need for therapy. She has now completed treatment and she is ready to focus on management of Paget's disease.  She suffered ulceration & pain after 5 weeks of treatment and has now been on treatment break for 2 weeks. Exam today reveals ulceration and erythema have resolved.   History of recurrent vulvar candidiasis-resolved after 52-monthcourse of Diflucan.  No evidence of recurrent candidiasis on exam and asymptomatic today.  Right breast cancer-newly diagnosed with right breast cancer status post biopsy.  Plans for right lumpectomy on 10/16/2019 with Dr. SLysle Pearl  She is followed by medical oncology/Dr. BRogue Bussingfor possible adjuvant treatment.      Plan:   Problem List Items Addressed This Visit      Musculoskeletal and Integument   Extramammary Paget disease - Primary     Plan to restart topical imiquimod. Reviewed plan to treat right vulva 3 nights a week at bedtime, leave on skin for 6-19 hours then remove the following morning by washing with mild soap and water. Encouraged to avoid over application of cream and only a thin layer is needed. Reviewed that skin reactions including erythema or ulceration is common and that if she experiences these symptoms to hold treatment for several days until symptoms resolve. Reviewed areas to be treated on right in detail with use of mirror and recommendation to apply topical desitin/vaseline to opposing/non-treatment surfaces particularly left vulva.   If intolerant  to treatment or refractory symptoms could consider vulvectomy with plastic surgery for skin graft versus Mohs surgery versus continued close surveillance. She had previously expressed her desires to avoid invasive procedures if possible.  Due to diagnosis of breast cancer after screening mammogram she is currently deferred screening colonoscopy.  Encouraged her to have this performed in the future.  Patient does request female providers only.  A total of (15) minutes of face-to-face time was spent with this patient with greater than 50% of that time in counseling and care-coordination.  Bailey Rutter DNP, AGNP-C [phone](clinic)

## 2020-03-23 ENCOUNTER — Encounter: Payer: Self-pay | Admitting: Genetic Counselor

## 2020-03-23 ENCOUNTER — Telehealth: Payer: Self-pay | Admitting: Genetic Counselor

## 2020-03-23 ENCOUNTER — Ambulatory Visit: Payer: Self-pay | Admitting: Genetic Counselor

## 2020-03-23 DIAGNOSIS — Z1379 Encounter for other screening for genetic and chromosomal anomalies: Secondary | ICD-10-CM | POA: Insufficient documentation

## 2020-03-23 NOTE — Telephone Encounter (Signed)
Revealed negative genetic testing.  Discussed that we do not know why she has breast cancer or her history of Lymphoma, or why there is cancer in the family. It could be due to a different gene that we are not testing, or maybe our current technology may not be able to pick something up.  It will be important for her to keep in contact with genetics to keep up with whether additional testing may be needed.

## 2020-03-23 NOTE — Progress Notes (Signed)
HPI:  Ms. Riendeau was previously seen in the [facility] due to a personal and family history of cancer and concerns regarding a hereditary predisposition to cancer. Please refer to our prior cancer genetics clinic note for more information regarding our discussion, assessment and recommendations, at the time. Ms. Newhart recent genetic test results were disclosed to her, as were recommendations warranted by these results. These results and recommendations are discussed in more detail below.  CANCER HISTORY:  Oncology History Overview Note  # RIGHT BREAST CANCER stage I [pTapN0; G-1; ER-100%; PR-99%; Her-2-NEG; Dr.Sakai]. 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   DIAGNOSIS: Right breast  STAGE:   I      ;  GOALS: cure  CURRENT/MOST RECENT THERAPY : anastrazole   Diffuse large b-cell lymphoma, intra-abdominal lymph nodes (HCC)  Carcinoma of upper-outer quadrant of right breast in female, estrogen receptor positive ([place])  10/28/2019 Initial Diagnosis   Carcinoma of upper-outer quadrant of right breast in female, estrogen receptor positive ([place])   03/20/2020 Genetic Testing   Negative genetic testing on the multi-cancer panel.  The Multi-Gene Panel offered by [facility] includes sequencing and/or deletion duplication testing of the following 85 genes: AIP, ALK, APC, ATM, AXIN2,BAP1,  BARD1, BLM, BMPR1A, BRCA1, BRCA2, BRIP1, CASR, CDC73, CDH1, CDK4, CDKN1B, CDKN1C, CDKN2A (p14ARF), CDKN2A (p16INK4a), CEBPA, CHEK2, CTNNA1, DICER1, DIS3L2, EGFR (c.2369C>T, p.Thr790Met variant only), EPCAM (Deletion/duplication testing only), FH, FLCN, GATA2, GPC3, GREM1  (Promoter region deletion/duplication testing only), HOXB13 (c.251G>A, p.Gly84Glu), HRAS, KIT, MAX, MEN1, MET, MITF (c.952G>A, p.Glu318Lys variant only), MLH1, MSH2, MSH3, MSH6, MUTYH, NBN, NF1, NF2, NTHL1, PALB2, PDGFRA, PHOX2B, PMS2, POLD1, POLE, POT1, PRKAR1A, PTCH1, PTEN, RAD50, RAD51C, RAD51D, RB1, RECQL4, RET, RNF43, RUNX1, SDHAF2, SDHA (sequence changes only), SDHB, SDHC, SDHD, SMAD4, SMARCA4, SMARCB1, SMARCE1, STK11, SUFU, TERC, TERT, TMEM127, TP53, TSC1, TSC2, VHL, WRN and WT1.  The report date is March 20, 2020.     FAMILY HISTORY:  We obtained a detailed, 4-generation family history.  Significant diagnoses are listed below: Family History  Problem Relation Age of Onset  . Diabetes Mother        died @ 34 of MI.  Marland Kitchen Hypertension Mother   . Heart attack Mother   . Melanoma Father 70       died of complications r/t melanoma w/ lung mets.  . Kidney disease Sister        Kidney removed   . Breast cancer Sister 29  . Prostate cancer Brother        Prostate - dx in 25's  . Heart disease Brother 13       reported MI @ age 57, ? treated w/ TPA->no recurrent CAD, now in 91's.  Marland Kitchen Hearing loss Maternal Aunt   . Cancer Maternal Aunt        pancreatic vs colon cancer  . Hearing loss Maternal Grandfather   . Other Maternal Grandmother        flu pandemic  . Kidney cancer Niece 70       partial nephrectomy    The patient has two sons who are cancer free.  She has a sister and two brothers.  Her sister was diagnosed  with breast cancer at 63 and one brother had skin cancer.  The other brother was diagnosed with prostate cancer and he has a daughter with kidney cancer.  Both parents are deceased.  The patient's father had melanoma 3 times and died at 70.  He had 8 siblings who reportedly did not have cancer.  There are no other reported family history of cancer.  The patient's mother died of a heart attack at 47.  There are no other reports of cancer on the maternal side of the  family.  Ms. Klinker is aware of previous family history of genetic testing for hereditary cancer risks. Patient's maternal ancestors are of [place] descent, and paternal ancestors are of [place] descent. There is no reported Ashkenazi Jewish ancestry. There is no known consanguinity.    GENETIC TEST RESULTS: Genetic testing reported out on March 20, 2020 through the multi-cancer panel found no pathogenic mutations. The Multi-Gene Panel offered by [facility] includes sequencing and/or deletion duplication testing of the following 85 genes: AIP, ALK, APC, ATM, AXIN2,BAP1,  BARD1, BLM, BMPR1A, BRCA1, BRCA2, BRIP1, CASR, CDC73, CDH1, CDK4, CDKN1B, CDKN1C, CDKN2A (p14ARF), CDKN2A (p16INK4a), CEBPA, CHEK2, CTNNA1, DICER1, DIS3L2, EGFR (c.2369C>T, p.Thr790Met variant only), EPCAM (Deletion/duplication testing only), FH, FLCN, GATA2, GPC3, GREM1 (Promoter region deletion/duplication testing only), HOXB13 (c.251G>A, p.Gly84Glu), HRAS, KIT, MAX, MEN1, MET, MITF (c.952G>A, p.Glu318Lys variant only), MLH1, MSH2, MSH3, MSH6, MUTYH, NBN, NF1, NF2, NTHL1, PALB2, PDGFRA, PHOX2B, PMS2, POLD1, POLE, POT1, PRKAR1A, PTCH1, PTEN, RAD50, RAD51C, RAD51D, RB1, RECQL4, RET, RNF43, RUNX1, SDHAF2, SDHA (sequence changes only), SDHB, SDHC, SDHD, SMAD4, SMARCA4, SMARCB1, SMARCE1, STK11, SUFU, TERC, TERT, TMEM127, TP53, TSC1, TSC2, VHL, WRN and WT1.. The test report has been scanned into EPIC and is located under the Molecular Pathology section of the Results Review tab.  A portion of the result report is included below for reference.     We discussed with Ms. Weich that because current genetic testing is not perfect, it is possible there may be a gene mutation in one of these genes that current testing cannot detect, but that chance is small.  We also discussed, that there could be another gene that has not yet been discovered, or that we have not yet tested, that is responsible for the cancer diagnoses in the family. It is  also possible there is a hereditary cause for the cancer in the family that Ms. Valdes did not inherit and therefore was not identified in her testing.  Therefore, it is important to remain in touch with cancer genetics in the future so that we can continue to offer Ms. Dalia the most up to date genetic testing.   ADDITIONAL GENETIC TESTING:  We discussed with Ms. Vogl that her genetic testing was fairly extensive.  If there are genes identified to increase cancer risk that can be analyzed in the future, we would be happy to discuss and coordinate this testing at that time.    CANCER SCREENING RECOMMENDATIONS: Ms. Hardie test result is considered negative (normal).  This means that we have not identified a hereditary cause for her personal and family history of cancer at this time. Most cancers happen by chance and this negative test suggests that her cancer may fall into this category.    While reassuring, this does not definitively rule out a hereditary predisposition to cancer. It is still possible that there could be genetic mutations that are undetectable by current technology. There could be genetic mutations in genes that have not been tested or identified  to increase cancer risk.  Therefore, it is recommended she continue to follow the cancer management and screening guidelines provided by her oncology and primary healthcare provider.   An individual's cancer risk and medical management are not determined by genetic test results alone. Overall cancer risk assessment incorporates additional factors, including personal medical history, family history, and any available genetic information that may result in a personalized plan for cancer prevention and surveillance  RECOMMENDATIONS FOR FAMILY MEMBERS:  Individuals in this family might be at some increased risk of developing cancer, over the general population risk, simply due to the family history of cancer.  We recommended women in this  family have a yearly mammogram beginning at age 83, or 22 years younger than the earliest onset of cancer, an annual clinical breast exam, and perform monthly breast self-exams. Women in this family should also have a gynecological exam as recommended by their primary provider. All family members should have a colonoscopy by age 75.  FOLLOW-UP: Lastly, we discussed with Ms. Liao that cancer genetics is a rapidly advancing field and it is possible that new genetic tests will be appropriate for her and/or her family members in the future. We encouraged her to remain in contact with cancer genetics on an annual basis so we can update her personal and family histories and let her know of advances in cancer genetics that may benefit this family.   Our contact number was provided. Ms. Henkes questions were answered to her satisfaction, and she knows she is welcome to call us at anytime with additional questions or concerns.   Roma Kayser, [place], [facility] Licensed, Certified Genetic Counselor [email or web address]

## 2020-03-24 ENCOUNTER — Other Ambulatory Visit: Payer: Self-pay

## 2020-03-24 ENCOUNTER — Ambulatory Visit: Payer: BC Managed Care – PPO | Admitting: Family Medicine

## 2020-03-24 DIAGNOSIS — I10 Essential (primary) hypertension: Secondary | ICD-10-CM | POA: Diagnosis not present

## 2020-03-24 DIAGNOSIS — R5383 Other fatigue: Secondary | ICD-10-CM | POA: Insufficient documentation

## 2020-03-24 DIAGNOSIS — E039 Hypothyroidism, unspecified: Secondary | ICD-10-CM | POA: Diagnosis not present

## 2020-03-24 DIAGNOSIS — E785 Hyperlipidemia, unspecified: Secondary | ICD-10-CM | POA: Diagnosis not present

## 2020-03-24 LAB — COMPREHENSIVE METABOLIC PANEL
ALT: 16 U/L (ref 0–35)
AST: 19 U/L (ref 0–37)
Albumin: 3.7 g/dL (ref 3.5–5.2)
Alkaline Phosphatase: 82 U/L (ref 39–117)
BUN: 21 mg/dL (ref 6–23)
CO2: 33 mEq/L — ABNORMAL HIGH (ref 19–32)
Calcium: 9.2 mg/dL (ref 8.4–10.5)
Chloride: 101 mEq/L (ref 96–112)
Creatinine, Ser: 0.89 mg/dL (ref 0.40–1.20)
GFR: 62.68 mL/min (ref >60.00)
Glucose, Bld: 110 mg/dL — ABNORMAL HIGH (ref 70–99)
Potassium: 3.5 mEq/L (ref 3.5–5.1)
Sodium: 140 mEq/L (ref 135–145)
Total Bilirubin: 0.4 mg/dL (ref 0.2–1.2)
Total Protein: 6.2 g/dL (ref 6.0–8.3)

## 2020-03-24 LAB — LDL CHOLESTEROL, DIRECT: Direct LDL: 121 mg/dL

## 2020-03-24 LAB — VITAMIN B12: Vitamin B-12: 253 pg/mL (ref 211–911)

## 2020-03-24 LAB — TSH: TSH: 1.01 u[IU]/mL (ref 0.35–4.50)

## 2020-03-24 NOTE — Assessment & Plan Note (Signed)
Check TSH.  Continue Synthroid. 

## 2020-03-24 NOTE — Assessment & Plan Note (Signed)
Check LDL.  Continue Zetia.  Discussed that the Zetia was to be in addition to the Lipitor.  Will consider restarting Lipitor once we get her labs back.

## 2020-03-24 NOTE — Patient Instructions (Signed)
Nice to see you. We will check lab work today and contact you with the results. Please try to build in some exercise.  Please try to add salads back at lunch.

## 2020-03-24 NOTE — Assessment & Plan Note (Signed)
Well-controlled.  Continue current regimen. 

## 2020-03-24 NOTE — Assessment & Plan Note (Addendum)
Encouraged diet and exercise.  

## 2020-03-24 NOTE — Assessment & Plan Note (Signed)
Possibly medication related.  We will check lab work as outlined below to rule out underlying causes.

## 2020-03-24 NOTE — Progress Notes (Signed)
[name], MD Phone: [phone]  Bailey Brooks is a 69 y.o. female who presents today for f/u.  HYPERTENSION  Disease Monitoring  Home BP Monitoring adequate control at home Chest pain- no    Dyspnea- no Medications  Compliance-  Taking chlorthalidone, lisinopril.  Edema- no  HYPOTHYROIDISM Disease Monitoring Weight changes: some weight loss though has had nausea with medications  Skin Changes: no Heat/Cold intolerance: Chronic cold intolerance.  Medication Monitoring Compliance:  Taking synthroid   Last TSH:   Lab Results  Component Value Date   TSH 3.02 11/25/2019   Hyperlipidemia: Patient started on Zetia though stopped the Lipitor.  Fatigue: Patient notes fatigue and nausea since starting on Zetia and anastrozole.  No abdominal pain.  Obesity: Patient notes he is eating all the wrong things.  She is not been exercising.  Work has been quite stressful and she has been exhausted when she gets home.  Social History   Tobacco Use  Smoking Status Never Smoker  Smokeless Tobacco Never Used     ROS see history of present illness  Objective  Physical Exam Vitals:   03/24/20 0829  BP: 120/70  Pulse: 78  Temp: (!) 96.4 F (35.8 C)  SpO2: 97%    BP Readings from Last 3 Encounters:  03/24/20 120/70  03/17/20 135/78  03/04/20 126/70   Wt Readings from Last 3 Encounters:  03/24/20 236 lb 12.8 oz (107.4 kg)  03/17/20 236 lb 11.2 oz (107.4 kg)  03/04/20 237 lb (107.5 kg)    Physical Exam Constitutional:      General: She is not in acute distress.    Appearance: She is not diaphoretic.  Cardiovascular:     Rate and Rhythm: Normal rate and regular rhythm.     Heart sounds: Normal heart sounds.  Pulmonary:     Effort: Pulmonary effort is normal.     Breath sounds: Normal breath sounds.  Musculoskeletal:     Right lower leg: No edema.     Left lower leg: No edema.  Skin:    General: Skin is warm and dry.  Neurological:     Mental Status: She  is alert.      Assessment/Plan: Please see individual problem list.  Hyperlipidemia Check LDL.  Continue Zetia.  Discussed that the Zetia was to be in addition to the Lipitor.  Will consider restarting Lipitor once we get her labs back.  Morbid obesity due to excess calories (HCC) Encouraged diet and exercise.  Hypothyroidism Check TSH.  Continue Synthroid.  Essential hypertension Well-controlled.  Continue current regimen.  Fatigue Possibly medication related.  We will check lab work as outlined below to rule out underlying causes.  Patient also notes that her son is being worked up for resistant hypertension and her son thinks that he may have Cushing's disease though has not been given a formal diagnosis.  She wonders if her or her husband to worry about this for themselves.  I discussed we need to wait on a formal diagnosis and then we can to decide what to do next for her or her husband who is also my patient.  Orders Placed This Encounter  Procedures  . TSH  . CBC w/Diff  . Comp Met (CMET)  . Direct LDL  . B12    No orders of the defined types were placed in this encounter.   This visit occurred during the SARS-CoV-2 public health emergency.  Safety protocols were in place, including screening questions prior to the visit,  additional usage of staff PPE, and extensive cleaning of exam room while observing appropriate contact time as indicated for disinfecting solutions.    Floreine Kingdon, MD [facility] [place]  

## 2020-03-25 LAB — CBC WITH DIFFERENTIAL/PLATELET
Basophils Absolute: 0 10*3/uL (ref 0.0–0.1)
Basophils Relative: 0.8 % (ref 0.0–3.0)
Eosinophils Absolute: 0.1 10*3/uL (ref 0.0–0.7)
Eosinophils Relative: 1.9 % (ref 0.0–5.0)
HCT: 39 % (ref 36.0–46.0)
Hemoglobin: 12.7 g/dL (ref 12.0–15.0)
Lymphocytes Relative: 23.9 % (ref 12.0–46.0)
Lymphs Abs: 1.2 10*3/uL (ref 0.7–4.0)
MCHC: 32.6 g/dL (ref 30.0–36.0)
MCV: 87.4 fl (ref 78.0–100.0)
Monocytes Absolute: 0.4 10*3/uL (ref 0.1–1.0)
Monocytes Relative: 8.5 % (ref 3.0–12.0)
Neutro Abs: 3.2 10*3/uL (ref 1.4–7.7)
Neutrophils Relative %: 64.9 % (ref 43.0–77.0)
Platelets: 240 10*3/uL (ref 150.0–400.0)
RBC: 4.46 Mil/uL (ref 3.87–5.11)
RDW: 15.5 % (ref 11.5–15.5)
WBC: 4.9 10*3/uL (ref 4.0–10.5)

## 2020-03-30 ENCOUNTER — Other Ambulatory Visit: Payer: Self-pay | Admitting: Family Medicine

## 2020-03-30 DIAGNOSIS — E538 Deficiency of other specified B group vitamins: Secondary | ICD-10-CM

## 2020-03-30 DIAGNOSIS — E785 Hyperlipidemia, unspecified: Secondary | ICD-10-CM

## 2020-03-30 MED ORDER — ATORVASTATIN CALCIUM 80 MG PO TABS: 80.0000 mg | ORAL_TABLET | Freq: Every day | ORAL | 3 refills | Status: DC

## 2020-04-05 ENCOUNTER — Inpatient Hospital Stay: Payer: BC Managed Care – PPO | Attending: Internal Medicine | Admitting: Internal Medicine

## 2020-04-05 DIAGNOSIS — C519 Malignant neoplasm of vulva, unspecified: Secondary | ICD-10-CM | POA: Diagnosis not present

## 2020-04-05 DIAGNOSIS — C50911 Malignant neoplasm of unspecified site of right female breast: Secondary | ICD-10-CM | POA: Insufficient documentation

## 2020-04-05 DIAGNOSIS — Z170 Estrogen receptor positive status [ER+]: Secondary | ICD-10-CM | POA: Diagnosis not present

## 2020-04-05 DIAGNOSIS — Z79899 Other long term (current) drug therapy: Secondary | ICD-10-CM | POA: Diagnosis not present

## 2020-04-05 DIAGNOSIS — C50411 Malignant neoplasm of upper-outer quadrant of right female breast: Secondary | ICD-10-CM | POA: Diagnosis not present

## 2020-04-05 NOTE — Assessment & Plan Note (Signed)
#  Right breast cancer stage I ER/PRPos; HER-2 negative.  Status post radiation. Currently on anastrozole. STABLE.   # Fatigue/Emotional labile- sec to AI; recommend exercise; will check re: Counsellor.   #Bone density test-February 2021-normal limits continue calcium plus vitamin D.   # DISPOSITION:call pt's cell phone # Follow up in  43month- MD; labs- cbc/cmp/TSH Dr.B  Cc; Dr.Sonnenberg.

## 2020-04-05 NOTE — Progress Notes (Signed)
I connected with Bailey Brooks on 04/05/20 at  3:00 PM EDT by video enabled telemedicine visit and verified that I am speaking with the correct person using two identifiers.  I discussed the limitations, risks, security and privacy concerns of performing an evaluation and management service by telemedicine and the availability of in-person appointments. I also discussed with the patient that there may be a patient responsible charge related to this service. The patient expressed understanding and agreed to proceed.    Other persons participating in the visit and their role in the encounter: RN/medical reconciliation Patient's location: home Provider's location: office  Oncology History Overview Note  # RIGHT BREAST CANCER stage I [pTapN0; G-1; ER-100%; PR-99%; Her-2-NEG; Dr.Sakai]. 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   DIAGNOSIS: Right breast  STAGE:   I      ;  GOALS: cure  CURRENT/MOST RECENT THERAPY : anastrazole   Diffuse large b-cell lymphoma, intra-abdominal lymph nodes (HCC)  Carcinoma of upper-outer quadrant of right breast in female, estrogen receptor positive ([place])  10/28/2019 Initial Diagnosis   Carcinoma of upper-outer quadrant of right breast in female, estrogen receptor positive ([place])   03/20/2020 Genetic Testing   Negative genetic testing on the multi-cancer panel.  The Multi-Gene Panel offered by [facility] includes sequencing and/or deletion duplication testing of the following 85 genes: AIP, ALK, APC, ATM, AXIN2,BAP1,  BARD1, BLM, BMPR1A, BRCA1, BRCA2, BRIP1, CASR, CDC73, CDH1, CDK4, CDKN1B, CDKN1C, CDKN2A (p14ARF), CDKN2A (p16INK4a),  CEBPA, CHEK2, CTNNA1, DICER1, DIS3L2, EGFR (c.2369C>T, p.Thr790Met variant only), EPCAM (Deletion/duplication testing only), FH, FLCN, GATA2, GPC3, GREM1 (Promoter region deletion/duplication testing only), HOXB13 (c.251G>A, p.Gly84Glu), HRAS, KIT, MAX, MEN1, MET, MITF (c.952G>A, p.Glu318Lys variant only), MLH1, MSH2, MSH3, MSH6, MUTYH, NBN, NF1, NF2, NTHL1, PALB2, PDGFRA, PHOX2B, PMS2, POLD1, POLE, POT1, PRKAR1A, PTCH1, PTEN, RAD50, RAD51C, RAD51D, RB1, RECQL4, RET, RNF43, RUNX1, SDHAF2, SDHA (sequence changes only), SDHB, SDHC, SDHD, SMAD4, SMARCA4, SMARCB1, SMARCE1, STK11, SUFU, TERC, TERT, TMEM127, TP53, TSC1, TSC2, VHL, WRN and WT1.  The report date is March 20, 2020.      Chief Complaint: Breast cancer.   History of present illness:Bailey Brooks 69 y.o.  female with history of stage I ER/PR positive HER-2 negative breast cancer is here for follow-up.  Patient is currently on anastrozole.  Patient notes to have emotionally labile; episodes of crying without any significant reason.  Minimal hot flashes.  Otherwise no worsening joint pains or bone pain.  No nausea no vomiting no headaches.   Observation/objective:  Assessment and plan: Carcinoma of upper-outer quadrant of right breast in female, estrogen receptor positive ([place]) # Right breast cancer stage I ER/PRPos; HER-2 negative.  Status post radiation. Currently on anastrozole. STABLE.   # Fatigue/Emotional labile- sec to AI; recommend exercise; will check re: Counsellor.   #Bone density test-February 2021-normal limits continue calcium plus vitamin D.   # DISPOSITION:call pt's cell phone # Follow up in  85month- MD; labs- cbc/cmp/TSH Dr.B  Cc; Dr.Sonnenberg.   Follow-up instructions:  I discussed the assessment and treatment plan with the patient.  The patient was provided an opportunity to ask questions and all were answered.  The patient agreed with the plan and demonstrated understanding  of instructions.  The patient  was advised to call back or seek an in person evaluation if the symptoms worsen or if the condition fails to improve as anticipated.  Dr. Charlaine Dalton [place] at [facility] 04/05/2020 3:15 PM

## 2020-04-07 ENCOUNTER — Telehealth: Payer: Self-pay

## 2020-04-07 DIAGNOSIS — E785 Hyperlipidemia, unspecified: Secondary | ICD-10-CM

## 2020-04-07 DIAGNOSIS — E538 Deficiency of other specified B group vitamins: Secondary | ICD-10-CM

## 2020-04-07 NOTE — Telephone Encounter (Signed)
-----   Message from Leone Haven, MD sent at 03/30/2020  9:12 AM EDT ----- Please let the patient know that her B12 is borderline low. I would like for her to start on an oral B12 supplement 1000 mcg daily over the counter and have this rechecked in 6 weeks to see if that will help with her fatigue. She needs to have her cholesterol checked again at that time as well and she needs to restart the lipitor.

## 2020-04-21 ENCOUNTER — Inpatient Hospital Stay (HOSPITAL_BASED_OUTPATIENT_CLINIC_OR_DEPARTMENT_OTHER): Payer: BC Managed Care – PPO | Admitting: Obstetrics and Gynecology

## 2020-04-21 ENCOUNTER — Other Ambulatory Visit: Payer: Self-pay

## 2020-04-21 DIAGNOSIS — Z79899 Other long term (current) drug therapy: Secondary | ICD-10-CM

## 2020-04-21 DIAGNOSIS — C519 Malignant neoplasm of vulva, unspecified: Secondary | ICD-10-CM | POA: Diagnosis not present

## 2020-04-21 DIAGNOSIS — C50911 Malignant neoplasm of unspecified site of right female breast: Secondary | ICD-10-CM | POA: Diagnosis not present

## 2020-04-21 DIAGNOSIS — C4499 Other specified malignant neoplasm of skin, unspecified: Secondary | ICD-10-CM | POA: Diagnosis not present

## 2020-04-21 MED ORDER — CLOBETASOL PROPIONATE 0.05 % EX CREA: 1.0000 "application " | TOPICAL_CREAM | Freq: Every evening | CUTANEOUS | 1 refills | Status: DC

## 2020-04-21 NOTE — Progress Notes (Signed)
Pt states that she has been taking AI for 2 months now ans she has notice that she has fatigue, and hot flashes mostly at night and can't eat same amount of food-it makes hre felly yucky but not nasueated. She has random muscle and joint aches and it goes away

## 2020-04-21 NOTE — Patient Instructions (Signed)
Continue to apply imiquimod on Mondays, Wednesdays, and Fridays. On alternate days when you are not using imiquimod use clobetasol, 0.05% cream to vulva. Contact us with any concerns, if you notice ulcers or skin breakdown.

## 2020-04-21 NOTE — Progress Notes (Signed)
Gynecologic Oncology Interval Visit   Referring Provider: Dr. Kenton Kingfisher  Chief Concern: Recurrent paget's disease of the vulva  Subjective:  Bailey Brooks is a 69 y.o. female, diagnosed with Paget's of the vulva, s/p WLE and vulvar biopsy 09/29/2015, who returns to clinic to re-evaluation.   Restarted imiquimod on 03/17/2020 and she presents for re-evaluation. She is using the cream but is getting tired of doing it. No ulcers or other complaints. She is due for her mammogram this October. She is on Arimidex and has fatigue and hot flashes due to this medication. Otherwise no complaints.     Gynecologic Oncology History: Bailey Brooks has a history of localized vulvar Paget's disease.   04/2013- vulvar biopsy revealed Paget's disease               WLE, additional margins resected for positive disease on frozen evaluation.   Part A: VULVA, VAGINAL MARGIN:  - POSITIVE FOR EXTRAMAMMARY PAGET'S DISEASE  Part B: VULVA, POSTERIOR MARGIN:  - POSITIVE FOR EXTRAMAMMARY PAGET'S DISEASE  Part C: VULVA, RIGHT, PARTIAL VULVECTOMY:  - EXTRAMAMMARY PAGET'S DISEASE  Part D: VULVA, NEW VAGINAL MARGIN:  - POSITIVE FOR EXTRAMAMMARY PAGET'S DISEASE  07/28/2015 for routine surveillance an area on the right vulva seemed more suspicious for recurrence. This was biopsied and confirmed recurrent Paget's disease.   She underwent repeat excision 09/29/15 A. VULVA; EXCISION:  - RARE CYTOKERATIN 7 POSITIVE INTRAEPIDERMAL CELLS, INTERPRETED AS RESIDUAL PAGET DISEASE.  - THE SURGICAL MARGINS ARE CLEAR.   B. VULVA, 11:00; BIOPSY:  - RARE CYTOKERATIN 7 POSITIVE INTRAEPIDERMAL CELLS, INTERPRETED AS PAGET DISEASE.   No additional issues since surgery.   She had vulvar biopsy on 05/09/17 which was negative:   DIAGNOSIS:  A. VULVA; BIOPSY:  - SKIN WITH FOCAL KERATOSIS AND HYPERGRANULOSIS - NEGATIVE FOR DYSPLASIA AND MALIGNANCY  Of note, she has had a h/o recurrent vulvar candidiasis. Prior to her vulvar  surgery 09/2015 she received 5 weekly doses of oral diflucan for vulvar candidiasis. Postop she was treated for yeast infection again in 12/17 and then took one Diflucan a week for six months with good results.   Seen in gyn-onc clinic on 01/16/18 by Dr. Theora Gianotti. NED at that time.   She has swelling of her lower extremities which was evaluated by her PCP and thought to be related to amlodipine. She started lasix which has improved her symptoms. She was seen by Dr. 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   She saw Dr. Theora Gianotti on 01/29/2019 with symptoms concerning for candidiasis at that time but no evidence of recurrent disease.   Her blood pressure medications were recently changes due to swelling secondary to amlodipine. No on lisinopril.   Previous biopsies were consistent with recurrent Paget's.   DIAGNOSIS:  A. VULVA, LEFT AT 3:00; PUNCH BIOPSY:  - BENIGN VULVAR TISSUE.  - NEGATIVE FOR DYSPLASIA AND MALIGNANCY.  DIAGNOSIS:  A. VULVA, RIGHT AT 7:00; PUNCH BIOPSY:  - EXTRAMAMMARY PAGET'S DISEASE OF THE VULVA.  - NEGATIVE FOR DYSPLASIA AND MALIGNANCY.   In the interim she has completed surgery and radiation for stage I right breast cancer, ER/PR positive HER-2/neu negative. She completed radiation on 12/29/2019. Anastrozole was recommended and she plans to start in approximately late February.   At last visit we had recommended start Aldara but due to recent diagnosis of breast cancer at that time, patient requested to wait to start treatment and returns today to  discuss options for management and consider starting Aldara. She is concerned about reaching site to be treated and tolerability. Her daughter-in-law is going to help her with application. Previously she had expressed desire to avoid surgery/Mohs.   01/16/20 started Imiquimod  completed 5 weeks of treatment then held 02/18/2020 due to ulceration, erythema, and pain. Restarted  on   Problem List: Patient Active Problem List   Diagnosis Date Noted  . Fatigue 03/24/2020  . Genetic testing 03/23/2020  . Family history of breast cancer   . Family history of prostate cancer   . Family history of melanoma   . Carcinoma of upper-outer quadrant of right breast in female, estrogen receptor positive ([place]) 10/28/2019  . Stress 08/06/2019  . Breast cancer screening 08/06/2019  . Pain due to onychomycosis of toenails of both feet 06/09/2019  . Prediabetes 12/20/2018  . Macular degeneration 06/20/2018  . GERD (gastroesophageal reflux disease) 06/20/2018  . Hypersomnia 06/20/2018  . Chronic pain of right knee 06/20/2018  . Bruising 12/14/2017  . Diffuse large B cell lymphoma ([place]) 06/11/2017  . Diffuse large b-cell lymphoma, intra-abdominal lymph nodes ([place]) 06/11/2017  . Vulvar lesion 05/16/2017  . Low back pain 04/03/2017  . History of transient ischemic attack (TIA) 02/10/2017  . Hypokalemia 02/10/2017  . Exertional shortness of breath 11/06/2016  . Right hip pain 08/03/2016  . Essential hypertension 07/06/2016  . Left knee pain 06/28/2016  . Hypomagnesemia 03/02/2016  . Morbid obesity due to excess calories ([place]) 03/02/2016  . 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  . Adjustment disorder with mixed anxiety and depressed mood 05/25/2015  . 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 Eczema 02/27/2013  . Muscle cramps 11/13/2012  . Personal history of lymphoma 02/20/2012  . Hypothyroidism 02/20/2012  . Hyperlipidemia 02/20/2012    Past Medical History: Past Medical History:  Diagnosis Date  . 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 Allergic rhinitis   . Anemia    WHILE GOING THRU CHEMO  . Arthritis   . CHF (congestive heart failure) ([place])    PT DENIES  . Diastolic dysfunction    a. 11/2016 Echo: EF 60-65%, no rwma, mild LVH, Gr1 DD, triv MR, mildly dil LA, nl RV  fxn.  . Diffuse large B cell lymphoma [facility]) Nov 2011   Dr Inez Pilgrim, Dr. Madelynn Done s/p RCHOP and methotrexate, c/b renal failure  . Diverticulosis   . Esophagitis   . Family history of breast cancer   . Family history of melanoma   . Family history of prostate cancer   . GERD (gastroesophageal reflux disease)   . H/O stem cell transplant ([place]) 06/2011   a. in setting of lymphoma.  . Hemorrhoids   . History of chemotherapy 22-Oct-2010   RHCOP/methotrexate-intrathecal  . History of stress test    a. 05/2010 Myoview: nl EF, no ischemia/infarct.  . Hypercholesterolemia   . Hypertension   . Hypothyroidism   . Morbid obesity ([place])   . 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 Paget disease, extra mammary    vulva, s/p resection 2014, Dr. Sabra Heck  . Paget's disease of vulva   . Pagets disease, extra-mammary   . TIA (transient ischemic attack)    X 2 -LAST ONE IN 2017    Past Surgical History: Past Surgical History:  Procedure Laterality Date  . ABDOMINAL HYSTERECTOMY  1985   Hysterectomy-partial  . BREAST BIOPSY  2002   Neg - AT [facility]  . BURR HOLE W/ PLACEMENT OMMAYA RESERVOIR    . BURR HOLE W/ PLACEMENT OMMAYA RESERVOIR  2012  . COLONOSCOPY  04/2013  . ESOPHAGOGASTRODUODENOSCOPY  04/2013  . INJECTION KNEE  07/02/2018  . INSERTION CENTRAL VENOUS ACCESS DEVICE W/ SUBCUTANEOUS PORT  2011   Port a Cath: Right chest Double Lumen, 04-Nov-2010  . LIMBAL STEM CELL TRANSPLANT  2012  . LIVER BIOPSY     stage 4B large Bcell lymphoma  . PARTIAL MASTECTOMY WITH NEEDLE LOCALIZATION AND AXILLARY SENTINEL LYMPH NODE BX Right 10/16/2019   Procedure: PARTIAL MASTECTOMY WITH NEEDLE LOCALIZATION AND AXILLARY SENTINEL LYMPH NODE BX;  Surgeon: Benjamine Sprague, DO;  Location: [facility] ORS;  Service: General;  Laterality: Right;  . ROTATOR CUFF REPAIR Right 2016  . SHOULDER ARTHROSCOPY Right 06/22/2015   Procedure: ARTHROSCOPY SHOULDER, parital repair of rotator cuff, biceps tenodesis, decompression  and debridement;  Surgeon: Corky Mull, MD;  Location: [facility] ORS;  Service: Orthopedics;  Laterality: Right;  . TONSILLECTOMY  1954  . VULVECTOMY    . VULVECTOMY PARTIAL N/A 09/29/2015   Procedure: VULVECTOMY PARTIAL;  Surgeon: Gillis Ends, MD;  Location: [facility] ORS;  Service: Gynecology;  Laterality: N/A;    Past Gynecologic History:  S/p hysterectomy  OB History:  OB History  No obstetric history on file.    Family History: Family History  Problem Relation Age of Onset  . Diabetes Mother        died @ 32 of MI.  Marland Kitchen Hypertension Mother   . Heart attack Mother   . Melanoma Father 5       died of complications r/t melanoma w/ lung mets.  . Kidney disease Sister        Kidney removed   . Breast cancer Sister 41  . Prostate cancer Brother        Prostate - dx in 10's  . Heart disease Brother 11       reported MI @ age 59, ? treated w/ TPA->no recurrent CAD, now in 77's.  Marland Kitchen Hearing loss Maternal Aunt   . Cancer Maternal Aunt        pancreatic vs colon cancer  . Hearing loss Maternal Grandfather   . Other Maternal Grandmother        flu pandemic  . Kidney cancer Niece 35       partial nephrectomy    Social History: Social History   Socioeconomic History  . Marital status: Married    Spouse name: Not on file  . Number of children: 2  . Years of education: Not on file  . Highest education level: Not on file  Occupational History  . 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 Smoking status: Never Smoker  . Smokeless tobacco: Never Used  Substance and Sexual Activity  . Alcohol use: No    Alcohol/week: 0.0 standard drinks    Comment: former user-no current use  . Drug use: No  . Sexual activity: Not Currently    Partners: Male    Birth control/protection: Post-menopausal  Other Topics Concern  . Not on file  Social History Narrative   Lives in [place] with husband, has 2 grown sons.  No pets. 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   Right-handed   Caffeine: occasional caffeine free/diet soda or hot tea   Social Determinants of Health   Financial Resource Strain:   . Difficulty  of Paying Living Expenses:   Food Insecurity:   . Worried About Charity fundraiser in the Last Year:   . Arboriculturist in the Last Year:   Transportation Needs:   . Film/video editor (Medical):   Marland Kitchen Lack of Transportation (Non-Medical):   Physical Activity:   . Days of Exercise per Week:   . Minutes of Exercise per Session:   Stress:   . Feeling of Stress :   Social Connections:   . Frequency of Communication with Friends and Family:   . Frequency of Social Gatherings with Friends and Family:   . Attends Religious Services:   . Active Member of Clubs or Organizations:   . Attends Archivist Meetings:   Marland Kitchen Marital Status:   Intimate Partner Violence:   . Fear of Current or Ex-Partner:   . Emotionally Abused:   Marland Kitchen Physically Abused:   . Sexually Abused:     Allergies: Allergies  Allergen Reactions  . Erythromycin Nausea And Vomiting  . Iodine Swelling    IV iodine (states now that this can be tolerated with Benadryl)  . Morphine And Related Nausea And Vomiting and Nausea Only    Hallucinations  . Oxycodone Nausea And Vomiting  . Sulfa Antibiotics Other (See Comments)    Dizzy/Fainting  . Adhesive [Tape] Rash and Other (See Comments)    Including Bandaids    Current Medications: Current Outpatient Medications  Medication Sig Dispense Refill  . anastrozole (ARIMIDEX) 1 MG tablet Take 1 tablet (1 mg total) by mouth daily. 30 tablet 6  . aspirin EC 81 MG tablet Take 81 mg by mouth daily.    Marland Kitchen atorvastatin (LIPITOR) 80 MG tablet Take 1 tablet (80 mg total) by mouth daily. 90 tablet 3  . chlorthalidone (HYGROTON) 25 MG tablet TAKE 1/2 TABLET(12.5 MG) BY MOUTH DAILY 45 tablet 1  . cholecalciferol (VITAMIN D3) 25 MCG (1000 UT) tablet Take 1,000 Units by mouth daily.    . clobetasol cream  (TEMOVATE) 6.94 % Apply 1 application topically daily as needed (psoriasis).     . ezetimibe (ZETIA) 10 MG tablet Take 1 tablet (10 mg total) by mouth daily. 90 tablet 3  . imiquimod (ALDARA) 5 % cream Apply topically 3 (three) times a week. 12 each 1  . lansoprazole (PREVACID) 30 MG capsule TAKE 1 CAPSULE(30 MG) BY MOUTH DAILY 90 capsule 1  . levothyroxine (SYNTHROID) 88 MCG tablet take 1 tablet by mouth every morning ON AN EMPTY STOMACH (Patient taking differently: Take 88 mcg by mouth daily before breakfast. ) 90 tablet 3  . lisinopril (ZESTRIL) 10 MG tablet TAKE 1 TABLET(10 MG) BY MOUTH DAILY 90 tablet 3  . mometasone (ELOCON) 0.1 % cream Apply 1 application topically daily as needed (eczema).   0  . Multiple Vitamin (MULTIVITAMIN) tablet Take 1 tablet by mouth daily.    Marland Kitchen Specialty Vitamins Products (ICAPS LUTEIN & ZEAXANTHIN PO) Take by mouth every morning. Zeaxanthin 44m/Lutein 161m     No current facility-administered medications for this visit.   Review of Systems General: no complaints  HEENT: no complaints  Lungs: no complaints  Cardiac: no complaints  GI: no complaints  GU: no complaints  Musculoskeletal: no complaints  Extremities: no complaints  Skin: no complaints  Neuro: no complaints  Endocrine: no complaints  Psych: no complaints        Objective:  Physical Examination:  There were no vitals taken for this visit.   There  is no height or weight on file to calculate BMI.   ECOG Performance Status: 0 - Asymptomatic   GENERAL: Patient is a well appearing female in no acute distress HEENT:  PERRL, neck supple  NODES:  No cervical, supraclavicular, axillary, or inguinal lymphadenopathy palpated.  LUNGS:  Clear to auscultation bilaterally.   HEART:  Regular rate and rhythm.  ABDOMEN:  Soft, nontender, nondistended, no ascites/masses/hernias. No hepatosplenomegaly.  EXTREMITIES:  No peripheral edema.   SKIN:  Clear with no obvious rashes or skin changes. No  nail dyscrasia. NEURO:  Nonfocal. Well oriented.  Appropriate affect.  Pelvic:Chaperoned by RN EGBUS: vulvar scars are well healed from prior resections. Very sharp demarcation between mucosa/vulvar tissue and epithelial tissue on the right, more notable than prior exam. No ulcerations. Erythema of left labia majora noted which is worse than on prior exam concerning for Paget's.  Cervix: absent Vagina: no lesions, no discharge or bleeding Uterus: absent BME: no palpable masses or nodularity Rectovaginal: deferred  Procedure Note:  Informed Consent obtained. Time out performed. Area cleaned with Betadine. Biopsy performed at 5 o'clock left outer aspect after anesthesia obtained with local 1 cc lidocaine. Hemostasis excellent with AgNO3. Patient reassessed after procedure and in stable condition. No complications.    03/04/20 Exam   02/18/20 Exam      Assessment:  Bailey Brooks is a 69 y.o. female diagnosed with recurrent Paget's disease of the vulva status post wide local excision with positive margins and subsequent recurrence in 07/2015.  Repeat wide local excision consistent with recurrent Paget's disease of the vulva status post wide local excision with negative margins.  Biopsy in 2018 was negative.  Biopsy in 2020 at 7:00 consistent with recurrent Paget's.  She deffered prior treatment due to breast cancer diagnosis and need for therapy. She has now completed treatment and she is ready to focus on management of Paget's disease.  She suffered ulceration & pain after 5 weeks of treatment requiring treatment break for 2 weeks, now restarted for the past month. Exam is concerning for lack of regression and possible Paget's on the left vulva as well as inflammation and risk of recurrent ulcer/skin breakdown.   History of recurrent vulvar candidiasis-resolved after 50-monthcourse of Diflucan.  No evidence of recurrent candidiasis on exam and asymptomatic today.  Right breast cancer-newly  diagnosed with right breast cancer status post biopsy.  Plans for right lumpectomy on 10/16/2019 with Dr. SLysle Pearl  She is followed by medical oncology/Dr. BRogue Bussingfor possible adjuvant treatment.      Plan:   Problem List Items Addressed This Visit      Musculoskeletal and Integument   Extramammary Paget disease - Primary     Follow left vulvar biopsy results. Continue imiquimod for 16 weeks and add  clobetasol, 0.05% cream to vulva on alternating days that she does not use imiquimod to help with inflammation and hopefully reduce risk of recurrent ulcer/skin breakdown. According to literature some patients on extended therapy for followed by weekly imiquimod for maintenance. She may need longer time on therapy. She is interested in continuing imiquimod because she wants to avoid surgery.   Follow up in one month for vulvar check to ensure no ulcers/skin breakdown and that she is tolerating therapy.  If intolerant to treatment or refractory symptoms could consider vulvectomy with plastic surgery for skin graft versus Mohs surgery versus continued close surveillance. She had previously expressed her desires to avoid invasive procedures if possible.  Due to diagnosis of breast  cancer after screening mammogram she is currently deferred screening colonoscopy.  Encouraged her to have this performed in the future.  Patient does request female providers only.  [place] Gaetana Michaelis, MD

## 2020-04-22 ENCOUNTER — Other Ambulatory Visit: Payer: Self-pay | Admitting: Family Medicine

## 2020-04-22 ENCOUNTER — Encounter: Payer: Self-pay | Admitting: Obstetrics and Gynecology

## 2020-04-23 LAB — SURGICAL PATHOLOGY

## 2020-04-26 NOTE — Telephone Encounter (Signed)
I contacted Ms. Odea with her pathology results c/w Paget's disease on the left. She would like to continue the imiquimod until after she [place] [facility] which is in June. She is not interested in surgery at this time. Plan for a full 16 weeks of imiquimod therapy for the left side, continue treatment on the right. She feels that the clobetosol has really helped with her ability to tolerate the medication. She will wait until the biopsy site heals on the left and then treat for a full 16 weeks and continue therapy on the right. She will call us as needed. She is aware of the need to contact us if she develops ulcers or skin erosions. Follow up after 16+ weeks with exam and biopsy. If she has persistent disease after a full course of therapy plan to discuss surgical options.  [place] Gaetana Michaelis, MD

## 2020-05-18 ENCOUNTER — Encounter: Payer: Self-pay | Admitting: Obstetrics and Gynecology

## 2020-05-19 ENCOUNTER — Other Ambulatory Visit: Payer: Self-pay

## 2020-05-19 ENCOUNTER — Ambulatory Visit: Payer: BC Managed Care – PPO

## 2020-05-19 MED ORDER — IMIQUIMOD 5 % EX CREA: TOPICAL_CREAM | CUTANEOUS | 1 refills | Status: DC

## 2020-05-20 ENCOUNTER — Telehealth: Payer: Self-pay

## 2020-05-20 NOTE — Telephone Encounter (Signed)
Received Prior authorization form from [facility] for her Imiquimod. She has used [facility] in the past for her medication due to her insurance denying it. Called [facility] and they have medication ready and it is $31.00.

## 2020-05-24 ENCOUNTER — Other Ambulatory Visit (INDEPENDENT_AMBULATORY_CARE_PROVIDER_SITE_OTHER): Payer: BC Managed Care – PPO

## 2020-05-24 ENCOUNTER — Other Ambulatory Visit: Payer: Self-pay

## 2020-05-24 DIAGNOSIS — E785 Hyperlipidemia, unspecified: Secondary | ICD-10-CM | POA: Diagnosis not present

## 2020-05-24 DIAGNOSIS — E538 Deficiency of other specified B group vitamins: Secondary | ICD-10-CM

## 2020-05-24 LAB — HEPATIC FUNCTION PANEL
ALT: 21 U/L (ref 0–35)
AST: 23 U/L (ref 0–37)
Albumin: 3.9 g/dL (ref 3.5–5.2)
Alkaline Phosphatase: 74 U/L (ref 39–117)
Bilirubin, Direct: 0.1 mg/dL (ref 0.0–0.3)
Total Bilirubin: 0.6 mg/dL (ref 0.2–1.2)
Total Protein: 6.8 g/dL (ref 6.0–8.3)

## 2020-05-24 LAB — VITAMIN B12: Vitamin B-12: 276 pg/mL (ref 211–911)

## 2020-05-24 LAB — LDL CHOLESTEROL, DIRECT: Direct LDL: 60 mg/dL

## 2020-05-28 ENCOUNTER — Encounter: Payer: Self-pay | Admitting: Family Medicine

## 2020-05-28 DIAGNOSIS — E538 Deficiency of other specified B group vitamins: Secondary | ICD-10-CM | POA: Insufficient documentation

## 2020-06-01 ENCOUNTER — Encounter: Payer: Self-pay | Admitting: Obstetrics and Gynecology

## 2020-06-02 ENCOUNTER — Other Ambulatory Visit: Payer: Self-pay | Admitting: *Deleted

## 2020-06-02 DIAGNOSIS — B3731 Acute candidiasis of vulva and vagina: Secondary | ICD-10-CM

## 2020-06-02 DIAGNOSIS — C4499 Other specified malignant neoplasm of skin, unspecified: Secondary | ICD-10-CM

## 2020-06-02 MED ORDER — FLUCONAZOLE 150 MG PO TABS: 150.0000 mg | ORAL_TABLET | Freq: Once | ORAL | 0 refills | Status: AC

## 2020-06-14 ENCOUNTER — Ambulatory Visit: Payer: BC Managed Care – PPO | Admitting: Dermatology

## 2020-06-14 ENCOUNTER — Other Ambulatory Visit: Payer: Self-pay

## 2020-06-14 DIAGNOSIS — L820 Inflamed seborrheic keratosis: Secondary | ICD-10-CM

## 2020-06-14 DIAGNOSIS — Z1283 Encounter for screening for malignant neoplasm of skin: Secondary | ICD-10-CM | POA: Diagnosis not present

## 2020-06-14 DIAGNOSIS — L578 Other skin changes due to chronic exposure to nonionizing radiation: Secondary | ICD-10-CM

## 2020-06-14 DIAGNOSIS — D1800 Hemangioma unspecified site: Secondary | ICD-10-CM

## 2020-06-14 DIAGNOSIS — R21 Rash and other nonspecific skin eruption: Secondary | ICD-10-CM | POA: Diagnosis not present

## 2020-06-14 DIAGNOSIS — L905 Scar conditions and fibrosis of skin: Secondary | ICD-10-CM

## 2020-06-14 DIAGNOSIS — L814 Other melanin hyperpigmentation: Secondary | ICD-10-CM

## 2020-06-14 DIAGNOSIS — D225 Melanocytic nevi of trunk: Secondary | ICD-10-CM

## 2020-06-14 DIAGNOSIS — L821 Other seborrheic keratosis: Secondary | ICD-10-CM

## 2020-06-14 DIAGNOSIS — D229 Melanocytic nevi, unspecified: Secondary | ICD-10-CM

## 2020-06-14 MED ORDER — TACROLIMUS 0.1 % EX OINT: TOPICAL_OINTMENT | CUTANEOUS | 2 refills | Status: AC

## 2020-06-14 NOTE — Patient Instructions (Addendum)
Recommend daily broad spectrum sunscreen SPF 30+ to sun-exposed areas, reapply every 2 hours as needed. Call for new or changing lesions.  Seborrheic Keratosis  What causes seborrheic keratoses? Seborrheic keratoses are harmless, common skin growths that first appear during adult life.  As time goes by, more growths appear.  Some people may develop a large number of them.  Seborrheic keratoses appear on both covered and uncovered body parts.  They are not caused by sunlight.  The tendency to develop seborrheic keratoses can be inherited.  They vary in color from skin-colored to gray, brown, or even black.  They can be either smooth or have a rough, warty surface.   Seborrheic keratoses are superficial and look as if they were stuck on the skin.  Under the microscope this type of keratosis looks like layers upon layers of skin.  That is why at times the top layer may seem to fall off, but the rest of the growth remains and re-grows.    Treatment Seborrheic keratoses do not need to be treated, but can easily be removed in the office.  Seborrheic keratoses often cause symptoms when they rub on clothing or jewelry.  Lesions can be in the way of shaving.  If they become inflamed, they can cause itching, soreness, or burning.  Removal of a seborrheic keratosis can be accomplished by freezing, burning, or surgery. If any spot bleeds, scabs, or grows rapidly, please return to have it checked, as these can be an indication of a skin cancer.    May use tacrolimus 0.1% ointment for itch 1-2 times daily as needed to affected areas at right wrist, face.  May use clobetasol to itchy areas at back as needed avoiding face, groin and underarms.

## 2020-06-14 NOTE — Progress Notes (Signed)
Follow-Up Visit   Subjective  Bailey Brooks is a 69 y.o. female who presents for the following: Annual Exam.  Patient here today for TBSE. There is no history of skin cancer but patient is being treated for Paget's disease of the vaginal area with imiquimod following 2 surgeries.  Patient does have a spot behind left knee that has been present for a few months and is rough to touch. Patient also has a few dry spots at back that itch and one at right eyebrow.  She has been using clobetasol for a few itchy spots at left chest and right wrist as needed.  The following portions of the chart were reviewed this encounter and updated as appropriate:      Review of Systems:  No other skin or systemic complaints except as noted in HPI or Assessment and Plan.  Objective  Well appearing patient in no apparent distress; mood and affect are within normal limits.  A full examination was performed including scalp, head, eyes, ears, nose, lips, neck, chest, axillae, abdomen, back, buttocks, bilateral upper extremities, bilateral lower extremities, hands, feet, fingers, toes, fingernails, and toenails. All findings within normal limits unless otherwise noted below.  Objective  Left Upper Back: 85mm brown macule  Objective  Right Upper Back, right upper temple: Erythematous keratotic waxy stuck-on macule/papule   Objective  Right Wrist: 1.0cm indistinct dusky pink smooth papule/nodule with surrounding violaceous hyperpigmentation c/w resolving purpura, itchy per pt  Objective  Right Ankle: 7mm violaceous brown macule, no changes per pt   Assessment & Plan  Nevus Left Upper Back  Benign-appearing.  Observation.  Call clinic for new or changing moles.  Recommend daily use of broad spectrum spf 30+ sunscreen to sun-exposed areas.    Inflamed seborrheic keratosis Right Upper Back, right upper temple  Benign, observe.   Patient defers treatment with Ln2. Recommend moisturizer daily.     May use tacrolimus 0.1% ointment for itch 1-2 times daily aas face as needed.  May use clobetasol to itchy areas at back qd as needed.   Rash Right Wrist  Possible GA vrs other, discussed biopsy, pt defers at this time Discontinue daily clobetasol due to risk of atrophy Start tacrolimus 0.1% ointment to affected areas 1-2 times daily as needed for itch.   Recheck on follow up.  Discussed biopsy if changing or not improving.  Ordered Medications: tacrolimus (PROTOPIC) 0.1 % ointment  Scar Right Ankle  Vs dermatofibroma vrs lentigo with purpura Benign-appearing.  Recheck on follow up.    Lentigines - Scattered tan macules - Discussed due to sun exposure - Benign, observe - Call for any changes  Seborrheic Keratoses - Stuck-on, waxy, tan-brown papules, including left popliteal, back - Discussed benign etiology and prognosis. - Observe - Call for any changes  Melanocytic Nevi - Tan-brown and/or pink-flesh-colored symmetric macules and papules - Benign appearing on exam today - Observation - Call clinic for new or changing moles - Recommend daily use of broad spectrum spf 30+ sunscreen to sun-exposed areas.   Hemangiomas - Red papules - Discussed benign nature - Observe - Call for any changes  Actinic Damage - diffuse scaly erythematous macules with underlying dyspigmentation - Recommend daily broad spectrum sunscreen SPF 30+ to sun-exposed areas, reapply every 2 hours as needed.  - Call for new or changing lesions.  Skin cancer screening performed today.   Return in about 1 year (around 06/14/2021) for TBSE, 6 months to recheck spots.  Graciella Belton, RMA, am acting  as scribe for Brendolyn Patty, MD . Documentation: I have reviewed the above documentation for accuracy and completeness, and I agree with the above.  Brendolyn Patty MD

## 2020-06-15 ENCOUNTER — Encounter: Payer: Self-pay | Admitting: Obstetrics and Gynecology

## 2020-06-23 ENCOUNTER — Encounter: Payer: Self-pay | Admitting: Radiation Oncology

## 2020-06-23 ENCOUNTER — Ambulatory Visit
Admission: RE | Admit: 2020-06-23 | Discharge: 2020-06-23 | Disposition: A | Discharge status: Home / Self Care | Payer: Medicare Other | Source: Ambulatory Visit | Attending: Radiation Oncology | Admitting: Radiation Oncology

## 2020-06-23 ENCOUNTER — Other Ambulatory Visit: Payer: Self-pay

## 2020-06-23 VITALS — BP 121/82 | HR 74 | Temp 98.8°F | Wt 223.0 lb

## 2020-06-23 DIAGNOSIS — Z923 Personal history of irradiation: Secondary | ICD-10-CM | POA: Insufficient documentation

## 2020-06-23 DIAGNOSIS — C50411 Malignant neoplasm of upper-outer quadrant of right female breast: Secondary | ICD-10-CM | POA: Insufficient documentation

## 2020-06-23 DIAGNOSIS — R5383 Other fatigue: Secondary | ICD-10-CM | POA: Insufficient documentation

## 2020-06-23 DIAGNOSIS — Z79811 Long term (current) use of aromatase inhibitors: Secondary | ICD-10-CM | POA: Insufficient documentation

## 2020-06-23 DIAGNOSIS — Z170 Estrogen receptor positive status [ER+]: Secondary | ICD-10-CM | POA: Diagnosis not present

## 2020-06-23 NOTE — Progress Notes (Signed)
Radiation Oncology Follow up Note  Name: Bailey Brooks   Date:   06/23/2020 MRN:  [id number] DOB: 1951/10/30    This 69 y.o. female presents to the clinic today for 22-month follow-up status post whole breast radiation to her right breast for stage T1 a ER positive PR positive invasive mammary carcinoma.  REFERRING PROVIDER: Leone Haven, MD  HPI: Patient is a 69 year old female now at 6 months having completed whole breast radiation to her right breast for stage I ER/PR positive invasive mammary carcinoma.  Seen today in routine follow-up she is doing well she does complain of some increased fatigue may be related to her antiestrogen therapy..  She is currently on anastrozole.  She is not yet had follow-up mammograms.  COMPLICATIONS OF TREATMENT: none  FOLLOW UP COMPLIANCE: keeps appointments   PHYSICAL EXAM:  BP 121/82 (BP Location: Left Wrist, Cuff Size: Normal)    Pulse 74    Temp 98.8 F (37.1 C) (Tympanic)    Wt 223 lb (101.2 kg)    BMI 39.50 kg/m  Lungs are clear to A&P cardiac examination essentially unremarkable with regular rate and rhythm. No dominant mass or nodularity is noted in either breast in 2 positions examined. Incision is well-healed. No axillary or supraclavicular adenopathy is appreciated. Cosmetic result is excellent.  Well-developed well-nourished patient in NAD. HEENT reveals PERLA, EOMI, discs not visualized.  Oral cavity is clear. No oral mucosal lesions are identified. Neck is clear without evidence of cervical or supraclavicular adenopathy. Lungs are clear to A&P. Cardiac examination is essentially unremarkable with regular rate and rhythm without murmur rub or thrill. Abdomen is benign with no organomegaly or masses noted. Motor sensory and DTR levels are equal and symmetric in the upper and lower extremities. Cranial nerves II through XII are grossly intact. Proprioception is intact. No peripheral adenopathy or edema is identified. No motor or sensory  levels are noted. Crude visual fields are within normal range.  RADIOLOGY RESULTS: No current films to review  PLAN: Present time patient is doing well I suggested she stops her anastrozole for couple weeks to see if this improves her fatigue.  I also asked her to take this up with Dr. B when she sees him next time in follow-up.  She is on also several other new medications.  Have also emphasized the need to exercise post breast cancer treatment.  I have asked to see her back in 6 months for follow-up.  Patient knows to call with any concerns.  I would like to take this opportunity to thank you for allowing me to participate in the care of your patient.Noreene Filbert, MD

## 2020-06-28 ENCOUNTER — Other Ambulatory Visit: Payer: Self-pay | Admitting: *Deleted

## 2020-06-28 MED ORDER — ANASTROZOLE 1 MG PO TABS: 1.0000 mg | ORAL_TABLET | Freq: Every day | ORAL | 0 refills | Status: DC

## 2020-06-29 ENCOUNTER — Encounter: Payer: Self-pay | Admitting: Internal Medicine

## 2020-07-02 ENCOUNTER — Other Ambulatory Visit: Payer: Self-pay

## 2020-07-02 DIAGNOSIS — C50411 Malignant neoplasm of upper-outer quadrant of right female breast: Secondary | ICD-10-CM

## 2020-07-05 ENCOUNTER — Other Ambulatory Visit: Payer: Self-pay

## 2020-07-05 ENCOUNTER — Inpatient Hospital Stay: Payer: Medicare Other | Attending: Internal Medicine

## 2020-07-05 ENCOUNTER — Inpatient Hospital Stay (HOSPITAL_BASED_OUTPATIENT_CLINIC_OR_DEPARTMENT_OTHER): Payer: Medicare Other | Admitting: Internal Medicine

## 2020-07-05 VITALS — BP 127/72 | HR 81 | Temp 99.7°F | Resp 18 | Wt 226.0 lb

## 2020-07-05 DIAGNOSIS — Z79899 Other long term (current) drug therapy: Secondary | ICD-10-CM | POA: Diagnosis not present

## 2020-07-05 DIAGNOSIS — Z803 Family history of malignant neoplasm of breast: Secondary | ICD-10-CM | POA: Insufficient documentation

## 2020-07-05 DIAGNOSIS — Z8042 Family history of malignant neoplasm of prostate: Secondary | ICD-10-CM | POA: Diagnosis not present

## 2020-07-05 DIAGNOSIS — Z170 Estrogen receptor positive status [ER+]: Secondary | ICD-10-CM

## 2020-07-05 DIAGNOSIS — Z833 Family history of diabetes mellitus: Secondary | ICD-10-CM | POA: Diagnosis not present

## 2020-07-05 DIAGNOSIS — C50411 Malignant neoplasm of upper-outer quadrant of right female breast: Secondary | ICD-10-CM | POA: Insufficient documentation

## 2020-07-05 DIAGNOSIS — I509 Heart failure, unspecified: Secondary | ICD-10-CM | POA: Insufficient documentation

## 2020-07-05 DIAGNOSIS — Z923 Personal history of irradiation: Secondary | ICD-10-CM | POA: Insufficient documentation

## 2020-07-05 DIAGNOSIS — Z7982 Long term (current) use of aspirin: Secondary | ICD-10-CM | POA: Insufficient documentation

## 2020-07-05 DIAGNOSIS — Z8349 Family history of other endocrine, nutritional and metabolic diseases: Secondary | ICD-10-CM | POA: Insufficient documentation

## 2020-07-05 DIAGNOSIS — Z8249 Family history of ischemic heart disease and other diseases of the circulatory system: Secondary | ICD-10-CM | POA: Diagnosis not present

## 2020-07-05 DIAGNOSIS — Z7951 Long term (current) use of inhaled steroids: Secondary | ICD-10-CM | POA: Insufficient documentation

## 2020-07-05 LAB — COMPREHENSIVE METABOLIC PANEL
ALT: 30 U/L (ref 0–44)
AST: 29 U/L (ref 15–41)
Albumin: 3.5 g/dL (ref 3.5–5.0)
Alkaline Phosphatase: 75 U/L (ref 38–126)
Anion gap: 10 (ref 5–15)
BUN: 21 mg/dL (ref 8–23)
CO2: 29 mmol/L (ref 22–32)
Calcium: 9 mg/dL (ref 8.9–10.3)
Chloride: 101 mmol/L (ref 98–111)
Creatinine, Ser: 0.97 mg/dL (ref 0.44–1.00)
GFR calc Af Amer: >60 mL/min (ref >60)
GFR calc non Af Amer: 59 mL/min — ABNORMAL LOW (ref >60)
Glucose, Bld: 131 mg/dL — ABNORMAL HIGH (ref 70–99)
Potassium: 3.5 mmol/L (ref 3.5–5.1)
Sodium: 140 mmol/L (ref 135–145)
Total Bilirubin: 0.6 mg/dL (ref 0.3–1.2)
Total Protein: 6.7 g/dL (ref 6.5–8.1)

## 2020-07-05 LAB — CBC WITH DIFFERENTIAL/PLATELET
Abs Immature Granulocytes: 0.04 10*3/uL (ref 0.00–0.07)
Basophils Absolute: 0 10*3/uL (ref 0.0–0.1)
Basophils Relative: 0 %
Eosinophils Absolute: 0.1 10*3/uL (ref 0.0–0.5)
Eosinophils Relative: 3 %
HCT: 36.4 % (ref 36.0–46.0)
Hemoglobin: 12.2 g/dL (ref 12.0–15.0)
Immature Granulocytes: 1 %
Lymphocytes Relative: 27 %
Lymphs Abs: 1 10*3/uL (ref 0.7–4.0)
MCH: 28.8 pg (ref 26.0–34.0)
MCHC: 33.5 g/dL (ref 30.0–36.0)
MCV: 85.8 fL (ref 80.0–100.0)
Monocytes Absolute: 0.2 10*3/uL (ref 0.1–1.0)
Monocytes Relative: 7 %
Neutro Abs: 2.3 10*3/uL (ref 1.7–7.7)
Neutrophils Relative %: 62 %
Platelets: 199 10*3/uL (ref 150–400)
RBC: 4.24 MIL/uL (ref 3.87–5.11)
RDW: 15.5 % (ref 11.5–15.5)
WBC: 3.7 10*3/uL — ABNORMAL LOW (ref 4.0–10.5)
nRBC: 0 % (ref 0.0–0.2)

## 2020-07-05 LAB — TSH: TSH: 1.086 u[IU]/mL (ref 0.350–4.500)

## 2020-07-05 NOTE — Assessment & Plan Note (Addendum)
#  Right breast cancer stage I ER/PRPos; HER-2 negative.  Status post radiation. Currently on anastrozole. STABLE.  # Weight loss- 10 pounds; ? nausea Sec to AI.- recommend taking at night with food.   # Fatigue/Emotional labile- sec to AI; recommend exercise; declines Counsellor.   # Bone density test-February 2021-normal limits continue calcium plus vitamin D.   # DISPOSITION:call pt's cell phone # Follow up in  3 months- MD; labs- no labs-  Dr.B  Cc; Dr.Sonnenberg.

## 2020-07-05 NOTE — Progress Notes (Signed)
[place] OFFICE PROGRESS NOTE  Patient Care Team: Leone Haven, MD as PCP - General (Family Medicine) Clent Jacks, RN as Registered Nurse  Cancer Staging No matching staging information was found for the patient.   Oncology History Overview Note  # RIGHT BREAST CANCER stage I [pTapN0; G-1; ER-100%; PR-99%; Her-2-NEG; Dr.Sakai]. 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   DIAGNOSIS: Right breast  STAGE:   I      ;  GOALS: cure  CURRENT/MOST RECENT THERAPY : anastrazole   Diffuse large b-cell lymphoma, intra-abdominal lymph nodes (HCC)  Carcinoma of upper-outer quadrant of right breast in female, estrogen receptor positive ([place])  10/28/2019 Initial Diagnosis   Carcinoma of upper-outer quadrant of right breast in female, estrogen receptor positive ([place])   03/20/2020 Genetic Testing   Negative genetic testing on the multi-cancer panel.  The Multi-Gene Panel offered by [facility] includes sequencing and/or deletion duplication testing of the following 85 genes: AIP, ALK, APC, ATM, AXIN2,BAP1,  BARD1, BLM, BMPR1A, BRCA1, BRCA2, BRIP1, CASR, CDC73, CDH1, CDK4, CDKN1B, CDKN1C, CDKN2A (p14ARF), CDKN2A (p16INK4a), CEBPA, CHEK2, CTNNA1, DICER1, DIS3L2, EGFR (c.2369C>T, p.Thr790Met variant only), EPCAM (Deletion/duplication testing only), FH, FLCN, GATA2, GPC3, GREM1 (Promoter region deletion/duplication testing only), HOXB13 (c.251G>A, p.Gly84Glu), HRAS, KIT, MAX, MEN1, MET, MITF (c.952G>A, p.Glu318Lys variant only), MLH1, MSH2, MSH3, MSH6, MUTYH, NBN, NF1, NF2, NTHL1, PALB2, PDGFRA,  PHOX2B, PMS2, POLD1, POLE, POT1, PRKAR1A, PTCH1, PTEN, RAD50, RAD51C, RAD51D, RB1, RECQL4, RET, RNF43, RUNX1, SDHAF2, SDHA (sequence changes only), SDHB, SDHC, SDHD, SMAD4, SMARCA4, SMARCB1, SMARCE1, STK11, SUFU, TERC, TERT, TMEM127, TP53, TSC1, TSC2, VHL, WRN and WT1.  The report date is March 20, 2020.       INTERVAL HISTORY:  KELSE PLOCH 69 y.o.  female pleasant patient stage I ER/PR positive HER-2 negative breast cancer on AI is here for follow-up.  Patient is currently status post radiation.  Is currently on anastrozole.  Complains of fatigue.  Complains of nausea.  Positive for about 10 pound weight loss.  Unable to exercise.  Denies any significant hot flashes or joint pains.     Review of Systems  Constitutional: Positive for malaise/fatigue and weight loss. Negative for chills, diaphoresis and fever.  HENT: Negative for nosebleeds and sore throat.   Eyes: Negative for double vision.  Respiratory: Negative for cough, hemoptysis, sputum production, shortness of breath and wheezing.   Cardiovascular: Negative for chest pain, palpitations, orthopnea and leg swelling.  Gastrointestinal: Negative for abdominal pain, blood in stool, constipation, diarrhea, heartburn, melena, nausea and vomiting.  Genitourinary: Negative for dysuria, frequency and urgency.  Musculoskeletal: Positive for back pain and joint pain.  Skin: Negative.  Negative for itching and rash.  Neurological: Negative for dizziness, tingling, focal weakness, weakness and headaches.  Endo/Heme/Allergies: Does not bruise/bleed easily.  Psychiatric/Behavioral: Negative for depression. The patient is not nervous/anxious and does not have insomnia.       PAST MEDICAL HISTORY :  Past Medical History:  Diagnosis Date  . 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 Allergic rhinitis   . Anemia    WHILE GOING THRU CHEMO  . Arthritis   . CHF (congestive  heart  failure) ([place])    PT DENIES  . Diastolic dysfunction    a. 11/2016 Echo: EF 60-65%, no rwma, mild LVH, Gr1 DD, triv MR, mildly dil LA, nl RV fxn.  . Diffuse large B cell lymphoma [facility]) Nov 2011   Dr Inez Pilgrim, Dr. Madelynn Done s/p RCHOP and methotrexate, c/b renal failure  . Diverticulosis   . Esophagitis   . Family history of breast cancer   . Family history of melanoma   . Family history of prostate cancer   . GERD (gastroesophageal reflux disease)   . H/O stem cell transplant ([place]) 06/2011   a. in setting of lymphoma.  . Hemorrhoids   . History of chemotherapy 22-Oct-2010   RHCOP/methotrexate-intrathecal  . History of stress test    a. 05/2010 Myoview: nl EF, no ischemia/infarct.  . Hypercholesterolemia   . Hypertension   . Hypothyroidism   . Morbid obesity ([place])   . 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 Paget disease, extra mammary    vulva, s/p resection 2014, Dr. Sabra Heck  . Paget's disease of vulva   . Pagets disease, extra-mammary   . TIA (transient ischemic attack)    X 2 -LAST ONE IN 2017    PAST SURGICAL HISTORY :   Past Surgical History:  Procedure Laterality Date  . ABDOMINAL HYSTERECTOMY  1985   Hysterectomy-partial  . BREAST BIOPSY  2002   Neg - AT [facility]  . BURR HOLE W/ PLACEMENT OMMAYA RESERVOIR    . BURR HOLE W/ PLACEMENT OMMAYA RESERVOIR  2012  . COLONOSCOPY  04/2013  . ESOPHAGOGASTRODUODENOSCOPY  04/2013  . INJECTION KNEE  07/02/2018  . INSERTION CENTRAL VENOUS ACCESS DEVICE W/ SUBCUTANEOUS PORT  2011   Port a Cath: Right chest Double Lumen, 04-Nov-2010  . LIMBAL STEM CELL TRANSPLANT  2012  . LIVER BIOPSY     stage 4B large Bcell lymphoma  . PARTIAL MASTECTOMY WITH NEEDLE LOCALIZATION AND AXILLARY SENTINEL LYMPH NODE BX Right 10/16/2019   Procedure: PARTIAL MASTECTOMY WITH NEEDLE LOCALIZATION AND AXILLARY SENTINEL LYMPH NODE BX;  Surgeon: Benjamine Sprague, DO;  Location: [facility] ORS;  Service: General;  Laterality: Right;  . ROTATOR CUFF REPAIR Right  2016  . SHOULDER ARTHROSCOPY Right 06/22/2015   Procedure: ARTHROSCOPY SHOULDER, parital repair of rotator cuff, biceps tenodesis, decompression and debridement;  Surgeon: Corky Mull, MD;  Location: [facility] ORS;  Service: Orthopedics;  Laterality: Right;  . TONSILLECTOMY  1954  . VULVECTOMY    . VULVECTOMY PARTIAL N/A 09/29/2015   Procedure: VULVECTOMY PARTIAL;  Surgeon: Gillis Ends, MD;  Location: [facility] ORS;  Service: Gynecology;  Laterality: N/A;    FAMILY HISTORY :   Family History  Problem Relation Age of Onset  . Diabetes Mother        died @ 54 of MI.  Marland Kitchen Hypertension Mother   . Heart attack Mother   . Melanoma Father 37       died of complications r/t melanoma w/ lung mets.  . Kidney disease Sister        Kidney removed   . Breast cancer Sister 26  . Prostate cancer Brother        Prostate - dx in 51's  . Heart disease Brother 13       reported MI @ age 18, ? treated w/ TPA->no recurrent CAD, now in 74's.  Marland Kitchen Hearing loss Maternal Aunt   . Cancer Maternal Aunt  pancreatic vs colon cancer  . Hearing loss Maternal Grandfather   . Other Maternal Grandmother        flu pandemic  . Kidney cancer Niece 56       partial nephrectomy    SOCIAL HISTORY:   Social History   Tobacco Use  . Smoking status: Never Smoker  . Smokeless tobacco: Never Used  Vaping Use  . Vaping Use: Never used  Substance Use Topics  . Alcohol use: No    Alcohol/week: 0.0 standard drinks    Comment: former user-no current use  . Drug use: No    ALLERGIES:  is allergic to erythromycin, iodine, morphine and related, oxycodone, sulfa antibiotics, and adhesive [tape].  MEDICATIONS:  Current Outpatient Medications  Medication Sig Dispense Refill  . anastrozole (ARIMIDEX) 1 MG tablet Take 1 tablet (1 mg total) by mouth daily. 90 tablet 0  . aspirin EC 81 MG tablet Take 81 mg by mouth daily.    Marland Kitchen atorvastatin (LIPITOR) 80 MG tablet Take 1 tablet (80 mg total) by mouth daily. 90 tablet  3  . chlorthalidone (HYGROTON) 25 MG tablet TAKE 1/2 TABLET(12.5 MG) BY MOUTH DAILY 45 tablet 1  . cholecalciferol (VITAMIN D3) 25 MCG (1000 UT) tablet Take 1,000 Units by mouth daily.    . clobetasol cream (TEMOVATE) 2.22 % Apply 1 application topically every evening. Use in the evenings that you do not use Imiquimod 30 g 1  . ezetimibe (ZETIA) 10 MG tablet Take 1 tablet (10 mg total) by mouth daily. 90 tablet 3  . imiquimod (ALDARA) 5 % cream Apply topically 3 (three) times a week. 12 each 1  . lansoprazole (PREVACID) 30 MG capsule TAKE 1 CAPSULE(30 MG) BY MOUTH DAILY 90 capsule 1  . levothyroxine (SYNTHROID) 88 MCG tablet TAKE 1 TABLET BY MOUTH EVERY MORNING ON AN EMPTY STOMACH 90 tablet 3  . lisinopril (ZESTRIL) 10 MG tablet TAKE 1 TABLET(10 MG) BY MOUTH DAILY 90 tablet 3  . mometasone (ELOCON) 0.1 % cream Apply 1 application topically daily as needed (eczema).   0  . Multiple Vitamin (MULTIVITAMIN) tablet Take 1 tablet by mouth daily.    Marland Kitchen Specialty Vitamins Products (ICAPS LUTEIN & ZEAXANTHIN PO) Take by mouth every morning. Zeaxanthin 80m/Lutein 164m    . tacrolimus (PROTOPIC) 0.1 % ointment Apply 1-2 times daily as needed for itch to affected areas. 60 g 2   No current facility-administered medications for this visit.   PHYSICAL EXAMINATION: ECOG PERFORMANCE STATUS: 0 - Asymptomatic  BP 127/72 (BP Location: Left Arm, Patient Position: Sitting, Cuff Size: Normal)   Pulse 81   Temp 99.7 F (37.6 C) (Tympanic)   Resp 18   Wt (!) 226 lb (102.5 kg)   SpO2 98%   BMI 40.03 kg/m   Filed Weights   07/05/20 0931  Weight: (!) 226 lb (102.5 kg)      Physical Activity:   . Days of Exercise per Week:   . Minutes of Exercise per Session:    Physical Exam HENT:     Head: Normocephalic and atraumatic.     Mouth/Throat:     Pharynx: No oropharyngeal exudate.  Eyes:     Pupils: Pupils are equal, round, and reactive to light.  Cardiovascular:     Rate and Rhythm: Normal rate and  regular rhythm.  Pulmonary:     Effort: No respiratory distress.     Breath sounds: No wheezing.  Abdominal:     General: Bowel sounds are  normal. There is no distension.     Palpations: Abdomen is soft. There is no mass.     Tenderness: There is no abdominal tenderness. There is no guarding or rebound.  Musculoskeletal:        General: No tenderness. Normal range of motion.     Cervical back: Normal range of motion and neck supple.  Skin:    General: Skin is warm.  Neurological:     Mental Status: She is alert and oriented to person, place, and time.  Psychiatric:        Mood and Affect: Affect normal.    LABORATORY DATA:  I have reviewed the data as listed    Component Value Date/Time   NA 140 07/05/2020 0923   NA 138 09/03/2013 0809   K 3.5 07/05/2020 0923   K 4.3 11/02/2014 0932   CL 101 07/05/2020 0923   CL 105 09/03/2013 0809   CO2 29 07/05/2020 0923   CO2 28 09/03/2013 0809   GLUCOSE 131 (H) 07/05/2020 0923   GLUCOSE 100 (H) 09/03/2013 0809   BUN 21 07/05/2020 0923   BUN 17 09/03/2013 0809   CREATININE 0.97 07/05/2020 0923   CREATININE 0.96 11/02/2014 0929   CALCIUM 9.0 07/05/2020 0923   CALCIUM 9.1 09/03/2013 0809   PROT 6.7 07/05/2020 0923   PROT 6.6 11/02/2014 0929   ALBUMIN 3.5 07/05/2020 0923   ALBUMIN 3.3 (L) 11/02/2014 0929   AST 29 07/05/2020 0923   AST 16 11/02/2014 0929   ALT 30 07/05/2020 0923   ALT 19 11/02/2014 0929   ALKPHOS 75 07/05/2020 0923   ALKPHOS 87 11/02/2014 0929   BILITOT 0.6 07/05/2020 0923   BILITOT 0.3 11/02/2014 0929   GFRNONAA 59 (L) 07/05/2020 0923   GFRNONAA >60 11/02/2014 0929   GFRNONAA >60 07/27/2014 0836   GFRAA >60 07/05/2020 0923   GFRAA >60 11/02/2014 0929   GFRAA >60 07/27/2014 0836    No results found for: SPEP, UPEP  Lab Results  Component Value Date   WBC 3.7 (L) 07/05/2020   NEUTROABS 2.3 07/05/2020   HGB 12.2 07/05/2020   HCT 36.4 07/05/2020   MCV 85.8 07/05/2020   PLT 199 07/05/2020       Chemistry      Component Value Date/Time   NA 140 07/05/2020 0923   NA 138 09/03/2013 0809   K 3.5 07/05/2020 0923   K 4.3 11/02/2014 0932   CL 101 07/05/2020 0923   CL 105 09/03/2013 0809   CO2 29 07/05/2020 0923   CO2 28 09/03/2013 0809   BUN 21 07/05/2020 0923   BUN 17 09/03/2013 0809   CREATININE 0.97 07/05/2020 0923   CREATININE 0.96 11/02/2014 0929      Component Value Date/Time   CALCIUM 9.0 07/05/2020 0923   CALCIUM 9.1 09/03/2013 0809   ALKPHOS 75 07/05/2020 0923   ALKPHOS 87 11/02/2014 0929   AST 29 07/05/2020 0923   AST 16 11/02/2014 0929   ALT 30 07/05/2020 0923   ALT 19 11/02/2014 0929   BILITOT 0.6 07/05/2020 0923   BILITOT 0.3 11/02/2014 0929       RADIOGRAPHIC STUDIES: I have personally reviewed the radiological images as listed and agreed with the findings in the report. No results found.   ASSESSMENT & PLAN:  Carcinoma of upper-outer quadrant of right breast in female, estrogen receptor positive ([place]) # Right breast cancer stage I ER/PRPos; HER-2 negative.  Status post radiation. Currently on anastrozole. STABLE.  # Weight loss-  10 pounds; ? nausea Sec to AI.- recommend taking at night with food.   # Fatigue/Emotional labile- sec to AI; recommend exercise; declines Counsellor.   # Bone density test-February 2021-normal limits continue calcium plus vitamin D.   # DISPOSITION:call pt's cell phone # Follow up in  3 months- MD; labs- no labs-  Dr.B  Cc; Dr.Sonnenberg.    No orders of the defined types were placed in this encounter.  All questions were answered. The patient knows to call the clinic with any problems, questions or concerns.      Cammie Sickle, MD 07/05/2020 9:56 AM

## 2020-07-13 ENCOUNTER — Encounter: Payer: Self-pay | Admitting: Obstetrics and Gynecology

## 2020-08-02 ENCOUNTER — Ambulatory Visit: Payer: Medicare Other | Admitting: Dermatology

## 2020-08-02 ENCOUNTER — Encounter: Payer: Self-pay | Admitting: Family Medicine

## 2020-08-02 ENCOUNTER — Ambulatory Visit: Payer: Medicare Other | Admitting: Family Medicine

## 2020-08-02 ENCOUNTER — Other Ambulatory Visit: Payer: Self-pay

## 2020-08-02 DIAGNOSIS — E785 Hyperlipidemia, unspecified: Secondary | ICD-10-CM

## 2020-08-02 DIAGNOSIS — E039 Hypothyroidism, unspecified: Secondary | ICD-10-CM

## 2020-08-02 DIAGNOSIS — Z8579 Personal history of other malignant neoplasms of lymphoid, hematopoietic and related tissues: Secondary | ICD-10-CM | POA: Diagnosis not present

## 2020-08-02 DIAGNOSIS — L304 Erythema intertrigo: Secondary | ICD-10-CM | POA: Diagnosis not present

## 2020-08-02 DIAGNOSIS — R7303 Prediabetes: Secondary | ICD-10-CM | POA: Diagnosis not present

## 2020-08-02 DIAGNOSIS — E538 Deficiency of other specified B group vitamins: Secondary | ICD-10-CM | POA: Diagnosis not present

## 2020-08-02 DIAGNOSIS — I10 Essential (primary) hypertension: Secondary | ICD-10-CM | POA: Diagnosis not present

## 2020-08-02 DIAGNOSIS — C4499 Other specified malignant neoplasm of skin, unspecified: Secondary | ICD-10-CM

## 2020-08-02 LAB — VITAMIN B12: Vitamin B-12: 589 pg/mL (ref 211–911)

## 2020-08-02 LAB — HEMOGLOBIN A1C: Hgb A1c MFr Bld: 5.8 % (ref 4.6–6.5)

## 2020-08-02 MED ORDER — KETOCONAZOLE 2 % EX CREA: TOPICAL_CREAM | CUTANEOUS | 2 refills | Status: DC

## 2020-08-02 NOTE — Progress Notes (Signed)
   Follow-Up Visit   Subjective  Bailey Brooks is a 69 y.o. female who presents for the following: Rash (groin, lower abdomen, itchy and painful, clobetasol cream ~45m).  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  She has also been applying the clobetasol to other areas in the groin.   The following portions of the chart were reviewed this encounter and updated as appropriate:      Review of Systems:  No other skin or systemic complaints except as noted in HPI or Assessment and Plan.  Objective  Well appearing patient in no apparent distress; mood and affect are within normal limits.  A focused examination was performed including groin, abdomen. Relevant physical exam findings are noted in the Assessment and Plan.  Objective  Medial upper thighs/groin: Bright erythema at upper medial thighs with moisture and mild maceration   Assessment & Plan  Erythema intertrigo Medial upper thighs/groin  With steroid atrophy D/C clobetasol cream to medial thighs/creases Start ketoconazole 2% cream thin layer to affected areas followed with tacrolimus ointment (pt has) twice daily.  Patient advised may burn a little initially but should lessen.  Recommend Zeasorb AF powder and/or Desitin as a skin protectant.    ketoconazole (NIZORAL) 2 % cream - Medial upper thighs/groin  Return 2-3 weeks, for intertrigo.  Graciella Belton, RMA, am acting as scribe for Brendolyn Patty, MD . Documentation: I have reviewed the above documentation for accuracy and completeness, and I agree with the above.  Brendolyn Patty MD

## 2020-08-02 NOTE — Assessment & Plan Note (Signed)
Patient will complete her follow-up with Dr. Theora Gianotti.

## 2020-08-02 NOTE — Assessment & Plan Note (Signed)
Check B12.  Continue over-the-counter B12 supplement.

## 2020-08-02 NOTE — Assessment & Plan Note (Signed)
Recent TSH acceptable.  Continue Synthroid.

## 2020-08-02 NOTE — Patient Instructions (Signed)
Nice to see you. We will get lab results and contact you with those.

## 2020-08-02 NOTE — Patient Instructions (Addendum)
Intertrigo -  Start ketoconazole 2% cream thin layer to affected areas followed with tacrolimus twice daily. Discontinue clobetasol.  Recommend Zeasorb AF powder daily or Desitin as skin protectant.

## 2020-08-02 NOTE — Assessment & Plan Note (Signed)
Well controlled. Continue Zetia and Crestor.  

## 2020-08-02 NOTE — Assessment & Plan Note (Signed)
Check A1c. 

## 2020-08-02 NOTE — Assessment & Plan Note (Signed)
Well-controlled.  Continue current regimen. 

## 2020-08-02 NOTE — Progress Notes (Signed)
[name], MD Phone: [phone]  Bailey Brooks is a 69 y.o. female who presents today for f/u.  HYPERTENSION  Disease Monitoring  Home BP Monitoring slightly high systolics though still well controlled Chest pain- no    Dyspnea- no Medications  Compliance-  Taking chlorthalidone, lisinopri. Lightheadedness-  no  Edema- no  HYPERLIPIDEMIA Symptoms Chest pain on exertion:  no    Medications: Compliance- taking zetia, lipitor Right upper quadrant pain- no  Muscle aches- no  PreDM: no polyuria or polydipsia.  B12 deficiency: She started on vitamin B12 oral supplement about 3 months ago.  She was having quite a bit of fatigue which has improved.  She wondered if the fatigue may have been related to her hormonal treatment for her breast cancer as well.  History of lymphoma: Patient has not noted any palpable lesions.  Recent CBC completed through oncology.  No weight loss. No fevers. No night sweats.   Extramammary Paget's disease: Patient has been following with GYN-ONC.  She is on imiquimod.  She notes she has about 3 more weeks left and then she will see Dr. Theora Gianotti for follow-up to determine the next step in management.   Social History   Tobacco Use  Smoking Status Never Smoker  Smokeless Tobacco Never Used     ROS see history of present illness  Objective  Physical Exam Vitals:   08/02/20 0822  BP: 115/80  Pulse: 76  Temp: 98.6 F (37 C)  SpO2: 98%    BP Readings from Last 3 Encounters:  08/02/20 115/80  07/05/20 127/72  06/23/20 121/82   Wt Readings from Last 3 Encounters:  08/02/20 225 lb (102.1 kg)  07/05/20 (!) 226 lb (102.5 kg)  06/23/20 223 lb (101.2 kg)    Physical Exam Constitutional:      General: She is not in acute distress.    Appearance: She is not diaphoretic.  Cardiovascular:     Rate and Rhythm: Normal rate and regular rhythm.     Heart sounds: Normal heart sounds.  Pulmonary:     Effort: Pulmonary effort is normal.      Breath sounds: Normal breath sounds.  Lymphadenopathy:     Head:     Right side of head: No submental or submandibular adenopathy.     Left side of head: No submental or submandibular adenopathy.     Cervical: No cervical adenopathy.     Upper Body:     Right upper body: No supraclavicular or axillary adenopathy.     Left upper body: No supraclavicular or axillary adenopathy.     Lower Body: No right inguinal adenopathy. No left inguinal adenopathy.  Skin:    General: Skin is warm and dry.  Neurological:     Mental Status: She is alert.      Assessment/Plan: Please see individual problem list.  Essential hypertension Well-controlled.  Continue current regimen.  Hypothyroidism Recent TSH acceptable.  Continue Synthroid.  Paget disease, extra mammary Patient will complete her follow-up with Dr. Theora Gianotti.  History of lymphoma Benign exam.  She will monitor for any lesions.  Hyperlipidemia Well-controlled.  Continue Zetia and Crestor.  Low serum vitamin B12 Check B12.  Continue over-the-counter B12 supplement.  Prediabetes Check A1c.   Orders Placed This Encounter  Procedures  . HgB A1c  . B12    No orders of the defined types were placed in this encounter.   This visit occurred during the SARS-CoV-2 public health emergency.  Safety protocols were in place,  including screening questions prior to the visit, additional usage of staff PPE, and extensive cleaning of exam room while observing appropriate contact time as indicated for disinfecting solutions.    [name], MD [place]

## 2020-08-02 NOTE — Assessment & Plan Note (Signed)
Benign exam.  She will monitor for any lesions.

## 2020-08-17 ENCOUNTER — Other Ambulatory Visit: Payer: Self-pay | Admitting: Family Medicine

## 2020-08-17 DIAGNOSIS — I10 Essential (primary) hypertension: Secondary | ICD-10-CM

## 2020-08-25 ENCOUNTER — Other Ambulatory Visit: Payer: Self-pay

## 2020-08-25 ENCOUNTER — Inpatient Hospital Stay: Payer: Medicare Other | Attending: Obstetrics and Gynecology | Admitting: Obstetrics and Gynecology

## 2020-08-25 VITALS — BP 117/53 | HR 79 | Temp 97.8°F | Resp 20 | Wt 227.3 lb

## 2020-08-25 DIAGNOSIS — Z170 Estrogen receptor positive status [ER+]: Secondary | ICD-10-CM | POA: Insufficient documentation

## 2020-08-25 DIAGNOSIS — Z79811 Long term (current) use of aromatase inhibitors: Secondary | ICD-10-CM | POA: Insufficient documentation

## 2020-08-25 DIAGNOSIS — Z8572 Personal history of non-Hodgkin lymphomas: Secondary | ICD-10-CM | POA: Insufficient documentation

## 2020-08-25 DIAGNOSIS — C50411 Malignant neoplasm of upper-outer quadrant of right female breast: Secondary | ICD-10-CM | POA: Diagnosis not present

## 2020-08-25 DIAGNOSIS — C4499 Other specified malignant neoplasm of skin, unspecified: Secondary | ICD-10-CM

## 2020-08-25 DIAGNOSIS — Z923 Personal history of irradiation: Secondary | ICD-10-CM | POA: Diagnosis not present

## 2020-08-25 DIAGNOSIS — G4710 Hypersomnia, unspecified: Secondary | ICD-10-CM | POA: Diagnosis not present

## 2020-08-25 DIAGNOSIS — E039 Hypothyroidism, unspecified: Secondary | ICD-10-CM | POA: Insufficient documentation

## 2020-08-25 DIAGNOSIS — C519 Malignant neoplasm of vulva, unspecified: Secondary | ICD-10-CM

## 2020-08-25 DIAGNOSIS — I10 Essential (primary) hypertension: Secondary | ICD-10-CM | POA: Diagnosis not present

## 2020-08-25 DIAGNOSIS — K219 Gastro-esophageal reflux disease without esophagitis: Secondary | ICD-10-CM | POA: Diagnosis not present

## 2020-08-25 DIAGNOSIS — Z6841 Body Mass Index (BMI) 40.0 and over, adult: Secondary | ICD-10-CM | POA: Diagnosis not present

## 2020-08-25 DIAGNOSIS — Z8673 Personal history of transient ischemic attack (TIA), and cerebral infarction without residual deficits: Secondary | ICD-10-CM | POA: Insufficient documentation

## 2020-08-25 NOTE — Patient Instructions (Signed)
Vulva Biopsy, Care After This sheet gives you information about how to care for yourself after your procedure. Your doctor may also give you more specific instructions. If you have problems or questions, contact your doctor. What can I expect after the procedure? After the procedure, it is common to have:  Slight bleeding from the biopsy site.  Soreness at the biopsy site. Follow these instructions at home: Biopsy site care   Follow instructions from your doctor about how to take care of your biopsy site. Make sure you: ? Clean the area using water and mild soap two times a day or as told by your doctor. Gently pat the area dry. ? If you were prescribed an antibiotic ointment, apply it as told by your doctor. Do not stop using the antibiotic even if your condition gets better. ? Take a warm water bath (sitz bath) as needed to help with pain. A sitz bath is taken while you are sitting down. The water should only come up to your hips and cover your butt. ? Leave stitches (sutures), skin glue, or skin tape (adhesive) strips in place. They may need to stay in place for 2 weeks or longer. If tape strips get loose and curl up, you may trim the loose edges. Do not remove tape strips completely unless your doctor says it is okay. ? Check your biopsy area every day for signs of infection. Check for:  More redness, swelling, or pain.  More fluid or blood.  Warmth.  Pus or a bad smell. ? Do not rub the biopsy area after peeing (urinating).  Gently pat the area dry, or use a bottle filled with warm water (peri-bottle) to clean the area.  Gently wipe from front to back. Lifestyle  Wear loose, cotton underwear.  Do not wear tight pants.  Do not use a tampon, douche, or put anything in your vagina for at least 1 week or until your doctor says it is okay.  Do not have sex for at least 1 week or until your doctor says it is okay.  Do not exercise until your doctor says it is okay.  Do not  swim or use a hot tub until your doctor says it is okay. You may shower or take a sitz bath. General instructions  Take over-the-counter and prescription medicines only as told by your doctor.  Use a sanitary pad until the bleeding stops.  Keep all follow-up visits as told by your doctor. This is important. Contact a doctor if:  You have more redness, swelling, or pain around your biopsy site.  You have more fluid or blood coming from your biopsy site.  Your biopsy site feels warm when you touch it.  Medicines do not help with your pain. Get help right away if:  You have a lot of bleeding from the vulva.  You have pus or a bad smell coming from your biopsy site.  You have a fever.  You have pain in the lower belly (abdomen). Summary  After the procedure, it is common to have slight bleeding and soreness at the biopsy site.  Follow all instructions as told by your doctor. Clean the area with water and mild soap. Do not rub. Pat the area dry.  Take sitz baths as needed. Leave any stitches in place.  Check your biopsy site for infection. Signs include more redness, swelling, pain, fluid, or blood, or feeling warm when you touch it.  Get help right away if you have a   lot of bleeding, a fever, pus or a bad smell, or pain in your lower belly. This information is not intended to replace advice given to you by your health care provider. Make sure you discuss any questions you have with your health care provider. Document Revised: 05/23/2018 Document Reviewed: 05/23/2018 Elsevier Patient Education  2020 Elsevier Inc.  

## 2020-08-25 NOTE — Progress Notes (Signed)
Gynecologic Oncology Interval Visit   Referring Provider: Dr. Kenton Brooks  Chief Concern: Recurrent paget's disease of the vulva  Subjective:  Bailey Brooks is a 69 y.o. female, diagnosed with Paget's of the vulva, s/p WLE and vulvar biopsy 09/29/2015, who returns to clinic to re-evaluation.   Restarted imiquimod on 03/17/2020 and she presents for re-evaluation after finishing therapy 08/14/2020.   She also had a groin rash that was treated with Diflucan and multiple creams with no improvement until she started Aquaphor. She feels that this has helped the most and she is going to follow up with her dermatology.     Gynecologic Oncology History: Bailey Brooks has a history of localized vulvar Paget's disease.   04/2013- vulvar biopsy revealed Paget's disease               WLE, additional margins resected for positive disease on frozen evaluation.   Part A: VULVA, VAGINAL MARGIN:  - POSITIVE FOR EXTRAMAMMARY PAGET'S DISEASE  Part B: VULVA, POSTERIOR MARGIN:  - POSITIVE FOR EXTRAMAMMARY PAGET'S DISEASE  Part C: VULVA, RIGHT, PARTIAL VULVECTOMY:  - EXTRAMAMMARY PAGET'S DISEASE  Part D: VULVA, NEW VAGINAL MARGIN:  - POSITIVE FOR EXTRAMAMMARY PAGET'S DISEASE  07/28/2015 for routine surveillance an area on the right vulva seemed more suspicious for recurrence. This was biopsied and confirmed recurrent Paget's disease.   She underwent repeat excision 09/29/15 A. VULVA; EXCISION:  - RARE CYTOKERATIN 7 POSITIVE INTRAEPIDERMAL CELLS, INTERPRETED AS RESIDUAL PAGET DISEASE.  - THE SURGICAL MARGINS ARE CLEAR.   B. VULVA, 11:00; BIOPSY:  - RARE CYTOKERATIN 7 POSITIVE INTRAEPIDERMAL CELLS, INTERPRETED AS PAGET DISEASE.   No additional issues since surgery.   She had vulvar biopsy on 05/09/17 which was negative:   DIAGNOSIS:  A. VULVA; BIOPSY:  - SKIN WITH FOCAL KERATOSIS AND HYPERGRANULOSIS - NEGATIVE FOR DYSPLASIA AND MALIGNANCY  Of note, she has had a h/o recurrent vulvar candidiasis.  Prior to her vulvar surgery 09/2015 she received 5 weekly doses of oral diflucan for vulvar candidiasis. Postop she was treated for yeast infection again in 12/17 and then took one Diflucan a week for six months with good results.   Seen in gyn-onc clinic on 01/16/18 by Bailey Brooks. NED at that time.   She has swelling of her lower extremities which was evaluated by her PCP and thought to be related to amlodipine. She started lasix which has improved her symptoms. She was seen by Bailey Brooks for history of DLBCL without evidence of recurrence and was released to care of her PCP.   She saw Bailey Brooks on 01/29/2019 with symptoms concerning for candidiasis at that time but no evidence of recurrent disease.   Her blood pressure medications were recently changes due to swelling secondary to amlodipine. No on lisinopril.   Previous biopsies were consistent with recurrent Paget's.   DIAGNOSIS:  A. VULVA, LEFT AT 3:00; PUNCH BIOPSY:  - BENIGN VULVAR TISSUE.  - NEGATIVE FOR DYSPLASIA AND MALIGNANCY.  DIAGNOSIS:  A. VULVA, RIGHT AT 7:00; PUNCH BIOPSY:  - EXTRAMAMMARY PAGET'S DISEASE OF THE VULVA.  - NEGATIVE FOR DYSPLASIA AND MALIGNANCY.   In the interim she has completed surgery and radiation for stage I right breast cancer, ER/PR positive HER-2/neu negative. She completed radiation on 12/29/2019. Anastrozole was recommended and she plans to start in approximately late February.   At last visit we had recommended start Aldara but due to recent diagnosis of breast cancer at that time, patient requested to wait to start treatment and  returns today to discuss options for management and consider starting Aldara. She is concerned about reaching site to be treated and tolerability. Her daughter-in-law is going to help her with application. Previously she had expressed desire to avoid surgery/Mohs.   01/16/20 started Imiquimod  completed 5 weeks of treatment then held 02/18/2020 due to ulceration, erythema,  and pain. Restarted on 03/17/2020. We recommended to continue imiquimod for 16 weeks and add  clobetasol, 0.05% cream to vulva on alternating days that she does not use imiquimod to help with inflammation and reduce risk of recurrent ulcer/skin breakdown.  Problem List: Patient Active Problem List   Diagnosis Date Noted  . Low serum vitamin B12 05/28/2020  . Fatigue 03/24/2020  . Genetic testing 03/23/2020  . Family history of breast cancer   . Family history of prostate cancer   . Family history of melanoma   . Carcinoma of upper-outer quadrant of right breast in female, estrogen receptor positive (Bailey Brooks) 10/28/2019  . Stress 08/06/2019  . Breast cancer screening 08/06/2019  . Pain due to onychomycosis of toenails of both feet 06/09/2019  . Prediabetes 12/20/2018  . Macular degeneration 06/20/2018  . GERD (gastroesophageal reflux disease) 06/20/2018  . Hypersomnia 06/20/2018  . Chronic pain of right knee 06/20/2018  . Bruising 12/14/2017  . History of lymphoma 06/11/2017  . Vulvar lesion 05/16/2017  . Low back pain 04/03/2017  . History of transient ischemic attack (TIA) 02/10/2017  . Hypokalemia 02/10/2017  . Right hip pain 08/03/2016  . Essential hypertension 07/06/2016  . Left knee pain 06/28/2016  . Hypomagnesemia 03/02/2016  . Morbid obesity due to excess calories (Bailey Brooks) 03/02/2016  . Adjustment disorder with mixed anxiety and depressed mood 05/25/2015  . 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 Eczema 02/27/2013  . Muscle cramps 11/13/2012  . Hypothyroidism 02/20/2012  . Hyperlipidemia 02/20/2012    Past Medical History: Past Medical History:  Diagnosis Date  . 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 Allergic rhinitis   . Anemia    WHILE GOING THRU CHEMO  . Arthritis   . CHF (congestive heart failure) (Bailey Brooks)    PT DENIES  . Diastolic dysfunction    a. 11/2016 Echo: EF  60-65%, no rwma, mild LVH, Gr1 DD, triv MR, mildly dil LA, nl RV fxn.  . 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 Diverticulosis   . Esophagitis   . Family history of breast cancer   . Family history of melanoma   . Family history of prostate cancer   . GERD (gastroesophageal reflux disease)   . H/O stem cell transplant ([place]) 06/2011   a. in setting of lymphoma.  . Hemorrhoids   . History of chemotherapy 22-Oct-2010   RHCOP/methotrexate-intrathecal  . History of stress test    a. 05/2010 Myoview: nl EF, no ischemia/infarct.  . Hypercholesterolemia   . Hypertension   . Hypothyroidism   . Morbid obesity ([place])   . 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 Paget disease, extra mammary    vulva, s/p resection 2014, Bailey. Sabra Heck  . Paget's disease of vulva   . Pagets disease, extra-mammary   . TIA (transient ischemic attack)    X 2 -LAST ONE IN 2017    Past Surgical History: Past Surgical History:  Procedure Laterality Date  .  ABDOMINAL HYSTERECTOMY  1985   Hysterectomy-partial  . BREAST BIOPSY  2002   Neg - AT [facility]  . BURR HOLE W/ PLACEMENT OMMAYA RESERVOIR    . BURR HOLE W/ PLACEMENT OMMAYA RESERVOIR  2012  . COLONOSCOPY  04/2013  . ESOPHAGOGASTRODUODENOSCOPY  04/2013  . INJECTION KNEE  07/02/2018  . INSERTION CENTRAL VENOUS ACCESS DEVICE W/ SUBCUTANEOUS PORT  2011   Port a Cath: Right chest Double Lumen, 04-Nov-2010  . LIMBAL STEM CELL TRANSPLANT  2012  . LIVER BIOPSY     stage 4B large Bcell lymphoma  . PARTIAL MASTECTOMY WITH NEEDLE LOCALIZATION AND AXILLARY SENTINEL LYMPH NODE BX Right 10/16/2019   Procedure: PARTIAL MASTECTOMY WITH NEEDLE LOCALIZATION AND AXILLARY SENTINEL LYMPH NODE BX;  Surgeon: Benjamine Sprague, DO;  Location: [facility] ORS;  Service: General;  Laterality: Right;  . ROTATOR CUFF REPAIR Right 2016  . SHOULDER ARTHROSCOPY Right 06/22/2015   Procedure: ARTHROSCOPY SHOULDER,  parital repair of rotator cuff, biceps tenodesis, decompression and debridement;  Surgeon: Corky Mull, MD;  Location: [facility] ORS;  Service: Orthopedics;  Laterality: Right;  . TONSILLECTOMY  1954  . VULVECTOMY    . VULVECTOMY PARTIAL N/A 09/29/2015   Procedure: VULVECTOMY PARTIAL;  Surgeon: Gillis Ends, MD;  Location: [facility] ORS;  Service: Gynecology;  Laterality: N/A;    Past Gynecologic History:  S/p hysterectomy  OB History:  OB History  No obstetric history on file.    Family History: Family History  Problem Relation Age of Onset  . Diabetes Mother        died @ 35 of MI.  Marland Kitchen Hypertension Mother   . Heart attack Mother   . Melanoma Father 56       died of complications r/t melanoma w/ lung mets.  . Kidney disease Sister        Kidney removed   . Breast cancer Sister 52  . Prostate cancer Brother        Prostate - dx in 55's  . Heart disease Brother 46       reported MI @ age 47, ? treated w/ TPA->no recurrent CAD, now in 44's.  Marland Kitchen Hearing loss Maternal Aunt   . Cancer Maternal Aunt        pancreatic vs colon cancer  . Hearing loss Maternal Grandfather   . Other Maternal Grandmother        flu pandemic  . Kidney cancer Niece 87       partial nephrectomy    Social History: Social History   Socioeconomic History  . Marital status: Married    Spouse name: Not on file  . Number of children: 2  . Years of education: Not on file  . Highest education level: Not on file  Occupational History  . 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 Smoking status: Never Smoker  . Smokeless tobacco: Never Used  Vaping Use  . Vaping Use: Never used  Substance and Sexual Activity  . Alcohol use: No    Alcohol/week: 0.0 standard drinks    Comment: former user-no current use  . Drug use: No  . Sexual activity: Not Currently    Partners: Male    Birth control/protection: Post-menopausal  Other Topics Concern  . Not on  file  Social History Narrative   Lives in [place] with husband, has 2 grown sons.  No pets. 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   Right-handed   Caffeine: occasional  caffeine free/diet soda or hot tea   Social Determinants of Health   Financial Resource Strain:   . Difficulty of Paying Living Expenses: Not on file  Food Insecurity:   . Worried About Charity fundraiser in the Last Year: Not on file  . Ran Out of Food in the Last Year: Not on file  Transportation Needs:   . Lack of Transportation (Medical): Not on file  . Lack of Transportation (Non-Medical): Not on file  Physical Activity:   . Days of Exercise per Week: Not on file  . Minutes of Exercise per Session: Not on file  Stress:   . Feeling of Stress : Not on file  Social Connections:   . Frequency of Communication with Friends and Family: Not on file  . Frequency of Social Gatherings with Friends and Family: Not on file  . Attends Religious Services: Not on file  . Active Member of Clubs or Organizations: Not on file  . Attends Archivist Meetings: Not on file  . Marital Status: Not on file  Intimate Partner Violence:   . Fear of Current or Ex-Partner: Not on file  . Emotionally Abused: Not on file  . Physically Abused: Not on file  . Sexually Abused: Not on file    Allergies: Allergies  Allergen Reactions  . Erythromycin Nausea And Vomiting  . Iodine Swelling    IV iodine (states now that this can be tolerated with Benadryl)  . Morphine And Related Nausea And Vomiting and Nausea Only    Hallucinations  . Oxycodone Nausea And Vomiting  . Sulfa Antibiotics Other (See Comments)    Dizzy/Fainting  . Adhesive [Tape] Rash and Other (See Comments)    Including Bandaids    Current Medications: Current Outpatient Medications  Medication Sig Dispense Refill  . anastrozole (ARIMIDEX) 1 MG tablet Take 1 tablet (1 mg total) by mouth daily. 90 tablet 0  . aspirin EC 81 MG tablet Take 81 mg by mouth daily.    Marland Kitchen  atorvastatin (LIPITOR) 80 MG tablet Take 1 tablet (80 mg total) by mouth daily. 90 tablet 3  . chlorthalidone (HYGROTON) 25 MG tablet TAKE 1/2 TABLET(12.5 MG) BY MOUTH DAILY 45 tablet 1  . cholecalciferol (VITAMIN D3) 25 MCG (1000 UT) tablet Take 1,000 Units by mouth daily.    . clobetasol cream (TEMOVATE) 1.79 % Apply 1 application topically every evening. Use in the evenings that you do not use Imiquimod 30 g 1  . ezetimibe (ZETIA) 10 MG tablet Take 1 tablet (10 mg total) by mouth daily. 90 tablet 3  . imiquimod (ALDARA) 5 % cream Apply topically 3 (three) times a week. 12 each 1  . ketoconazole (NIZORAL) 2 % cream Apply twice daily as directed. 60 g 2  . lansoprazole (PREVACID) 30 MG capsule TAKE 1 CAPSULE(30 MG) BY MOUTH DAILY 90 capsule 1  . levothyroxine (SYNTHROID) 88 MCG tablet TAKE 1 TABLET BY MOUTH EVERY MORNING ON AN EMPTY STOMACH 90 tablet 3  . lisinopril (ZESTRIL) 10 MG tablet TAKE 1 TABLET(10 MG) BY MOUTH DAILY 90 tablet 3  . mometasone (ELOCON) 0.1 % cream Apply 1 application topically daily as needed (eczema).   0  . Multiple Vitamin (MULTIVITAMIN) tablet Take 1 tablet by mouth daily.    Marland Kitchen Specialty Vitamins Products (ICAPS LUTEIN & ZEAXANTHIN PO) Take by mouth every morning. Zeaxanthin 43m/Lutein 175m    . tacrolimus (PROTOPIC) 0.1 % ointment Apply 1-2 times daily as needed for itch to  affected areas. 60 g 2   No current facility-administered medications for this visit.   Review of Systems General: no complaints  HEENT: no complaints  Lungs: no complaints  Cardiac: no complaints  GI: no complaints  GU: no complaints  Musculoskeletal: no complaints  Extremities: no complaints  Skin: no complaints  Neuro: no complaints  Endocrine: no complaints  Psych: no complaints        Objective:  Physical Examination:  BP (!) 117/53   Pulse 79   Temp 97.8 F (36.6 C)   Resp 20   Wt 227 lb 4.8 oz (103.1 kg)   SpO2 97%   BMI 40.26 kg/m    Body mass index is 40.26  kg/m.   ECOG Performance Status: 0 - Asymptomatic   GENERAL: Patient is a well appearing female in no acute distress HEENT:  PERRL, neck supple with midline trachea. Thyroid without masses.  NODES:  No cervical, supraclavicular, axillary, or inguinal lymphadenopathy palpated.  LUNGS:  Clear to auscultation bilaterally.  No wheezes or rhonchi. HEART:  Regular rate and rhythm. No murmur appreciated. NEURO:  Nonfocal. Well oriented.  Appropriate affect.  Pelvic:Chaperoned by NP and CMA EGBUS: ulvar scars are well healed from prior resections unchanged. Very sharp demarcation between mucosa/vulvar tissue and epithelial tissue on the right, unchanged compared to prior exam. No ulcerations. Erythema of left labia majora is improved compared to prior exam.  Cervix: absent Vagina: no lesions, no discharge or bleeding  Remainder exam deferred from 04/21/2020 Uterus: normal size, nontender, mobile Adnexa: no palpable masses Rectovaginal: confirmatory   Procedure Note:  Informed Consent obtained. Time out performed. Area cleaned with Betadine. Anesthesia obtained with topical lidocaine gel and local 2% 2 cc lidocaine. Biopsy performed at 3 and 9 o'clock left outer aspect after anesthesia.  Hemostasis excellent with AgNO3. Patient reassessed after procedure and in stable condition. No complications.    03/04/20 Exam   02/18/20 Exam      Assessment:  MONAY HOULTON is a 69 y.o. female diagnosed with recurrent Paget's disease of the vulva status post wide local excision with positive margins and subsequent recurrence in 07/2015.  Repeat wide local excision consistent with recurrent Paget's disease of the vulva status post wide local excision with negative margins.  Biopsy in 2018 was negative.  Biopsy in 2020 at 7:00 consistent with recurrent Paget's.  She deffered prior treatment due to breast cancer diagnosis and need for therapy. She has now completed treatment and she is ready to focus on  management of Paget's disease.  01/16/20 started Imiquimod  completed 5 weeks of treatment then held 02/18/2020 due to ulceration, erythema, and pain. Restarted on 03/17/2020 and completed  08/14/2020.   History of recurrent vulvar candidiasis.  Right breast cancer-newly diagnosed with right breast cancer status post biopsy.  Plans for right lumpectomy on 10/16/2019 with Bailey. Lysle Pearl.  She is followed by medical oncology/Bailey Brooks for possible adjuvant treatment.      Plan:   Problem List Items Addressed This Visit    None    Visit Diagnoses    Extramammary Paget disease    -  Primary   Relevant Orders   Surgical pathology   Surgical pathology     Follow vulvar biopsy results.    If intolerant to treatment or refractory symptoms could consider vulvectomy with plastic surgery for skin graft versus Mohs surgery versus continued close surveillance versus imiquimod for maintenance therapy to prevent progression. She had previously expressed her desires to avoid  invasive procedures if possible.  Patient does request female providers only.  Dmario Russom Gaetana Michaelis, MD

## 2020-08-30 ENCOUNTER — Encounter: Payer: Self-pay | Admitting: Emergency Medicine

## 2020-08-30 ENCOUNTER — Other Ambulatory Visit: Payer: Self-pay

## 2020-08-30 ENCOUNTER — Ambulatory Visit: Payer: Medicare Other | Admitting: Dermatology

## 2020-08-30 ENCOUNTER — Emergency Department: Payer: Medicare Other

## 2020-08-30 ENCOUNTER — Emergency Department
Admission: EM | Admit: 2020-08-30 | Discharge: 2020-08-30 | Disposition: A | Discharge status: Home / Self Care | Payer: Medicare Other | Attending: Emergency Medicine | Admitting: Emergency Medicine

## 2020-08-30 ENCOUNTER — Telehealth: Payer: Self-pay | Admitting: Family Medicine

## 2020-08-30 DIAGNOSIS — R55 Syncope and collapse: Secondary | ICD-10-CM | POA: Diagnosis not present

## 2020-08-30 DIAGNOSIS — Z5321 Procedure and treatment not carried out due to patient leaving prior to being seen by health care provider: Secondary | ICD-10-CM | POA: Diagnosis not present

## 2020-08-30 DIAGNOSIS — Z043 Encounter for examination and observation following other accident: Secondary | ICD-10-CM | POA: Diagnosis not present

## 2020-08-30 LAB — SURGICAL PATHOLOGY

## 2020-08-30 NOTE — Telephone Encounter (Signed)
[facility] RECORD AccessNurse Patient Name: Bailey Brooks Gender: Female DOB: Jun 22, 1951 Age: 69 Y 77 M 4 D Return Phone Number: [phone] (Primary) Address: City/State/Zip: [place] [address] Client [facility] [place] Client Site [place] Physician [name] - MD Contact Type Call Who Is Calling Patient / Member / Family / Caregiver Call Type Triage / Clinical Relationship To Patient Self Return Phone Number [phone] (Primary) Chief Complaint HEAD INJURY - and not acting right. Change in behaviour after hitting head. Reason for Call Symptomatic / Request for Health Information Initial Comment Transferred from answering service. Had bi-opsey procedure last week, now is constipated. Strained to use bathroom and fell off toilet and hit head. Now PT is nauseated and feels fatigue. PT has had a procedure on her head before and now that area is swollen. Translation No Nurse Assessment Nurse: Jimmye Norman, RN, Whitney Date/Time (Eastern Time): 08/30/2020 9:05:21 AM Confirm and document reason for call. If symptomatic, describe symptoms. ---Had biopsy procedure last week, now is constipated. Strained to use bathroom and fell off toilet and hit head on Saturday morning. States she never did feel nauseated when it happened and felt fine yesterday. This morning she got up and felt woozy when she got up from the toilet, states when she looked at her self in the mirror her eyes are black and blue. States has had a procedure on her head before and now that area is swollen. Does the patient have any new or worsening symptoms? ---Yes Will a triage be completed? ---Yes Related visit to physician within the last 2 weeks? ---No Does the PT have any chronic conditions? (i.e. diabetes, asthma, this includes High risk factors for pregnancy, etc.)  ---Yes List chronic conditions. ---hypertension Is this a behavioral health or substance abuse call? ---No Guidelines Guideline Title Affirmed Question Affirmed Notes Nurse Date/Time (Eastern Time) Head Injury Large swelling or bruise > 2 inches (5 cm) Jimmye Norman, RN, Loree Fee 08/30/2020 9:07:16 AM PLEASE NOTE: All timestamps contained within this report are represented as [place] Standard Time. CONFIDENTIALTY NOTICE: This fax transmission is intended only for the addressee. It contains information that is legally privileged, confidential or otherwise protected from use or disclosure. If you are not the intended recipient, you are strictly prohibited from reviewing, disclosing, copying using or disseminating any of this information or taking any action in reliance on or regarding this information. If you have received this fax in error, please notify us immediately by telephone so that we can arrange for its return to [place]. Phone: [phone], Toll-Free: [phone], Fax: [phone] Page: 2 of 2 Call Id: [id number] Fernando Salinas. Time Eilene Ghazi Time) Disposition Final User 08/30/2020 9:04:15 AM Send to Urgent Queue Silvano Rusk 08/30/2020 9:12:32 AM Go to ED Now Yes Jimmye Norman, RN, Whitney Caller Disagree/Comply Disagree Caller Understands Yes PreDisposition InappropriateToAsk Care Advice Given Per Guideline GO TO ED NOW: * You need to be seen in the Emergency Department. Comments User: Myriam Forehand, RN Date/Time Eilene Ghazi Time): 08/30/2020 9:17:22 AM RN relayed triage outcome and caller symptoms and refusal to go to ED to office staff.

## 2020-08-30 NOTE — ED Triage Notes (Signed)
Pt in via POV, reports vagaling down while straining to have a BM on Saturday, hitting head on tile floor.  Denies blood thinners.  Bruising, swelling noted to bilateral eyes and right forehead.  Denies any changes to vision, denies dizziness since the incident.  Vitals WDL, NAD noted at this time.

## 2020-08-30 NOTE — Telephone Encounter (Signed)
I called and spoke with the patient and informed her that the provider stated he could not do a referral for a MRI, she must be seen, I did convince the patient to go to urgent care at [facility] clinic and be evaluated because she loss consciousness and she agreed.  Waleska Buttery,cma

## 2020-08-30 NOTE — ED Notes (Signed)
Upon doing EKG, pt states, "I am just here to get my head scanned."  Pt currently declines EKG and blood work.

## 2020-08-30 NOTE — ED Notes (Signed)
This tech explained to pt that d/t her having a syncopal episode that we would do an EKG to make sure her heart is fine. Pt is more concerned about her head and not wanting an EKG preformed at this time. Pt stated "I am pretty sure my heart is fine. I just want some kind of scan of my head." Pt informed that this tech will inform RN.

## 2020-08-30 NOTE — Telephone Encounter (Signed)
Pt said she had biopsy done on Weds. They gave her lidocaine shots and she has been constipated since then. On Sat morning she strained trying to use the bathroom and felt lightheaded which caused her to fall off the toilet and bump her head. Today when she got up she is feeling woozy and feel like she is going to pass out. I transferred patient to access nurse.

## 2020-08-30 NOTE — Telephone Encounter (Addendum)
Patient is currently in ED.

## 2020-08-31 NOTE — Telephone Encounter (Signed)
Ok to schedule for Friday in the open same day slot. It looks like she was not evaluated by a physician in the ED as she left prior to being seen. Please see what symptoms she is currently having. Thanks.

## 2020-08-31 NOTE — Telephone Encounter (Signed)
Noted.  If she has any worsening headache, dizziness, numbness, weakness, or any new symptoms she needs to be seen in person at urgent care or the emergency room prior to Friday.  Thanks.

## 2020-08-31 NOTE — Telephone Encounter (Signed)
Pt said she needs to be seen this week. She wants to review her scans from ED. She also wants a referral to get a colonoscopy.

## 2020-08-31 NOTE — Telephone Encounter (Signed)
  Pt said she needs to be seen this week. She wants to review her scans from ED. She also wants a referral to get a colonoscopy.   Bailey Brooks,cma

## 2020-08-31 NOTE — Telephone Encounter (Signed)
I called and LVM for the patient infroming her that if she has any new symtoms to please go to the ED or urgent care.  Shonta Bourque,cma

## 2020-08-31 NOTE — Telephone Encounter (Signed)
I called and spoke with the patient and informed her that she could be seen this Friday @ 3;15 pm per the provider and she agreed, the patient stated she has bruising around her eyes and she has a lump on her head from her fall and no other symptoms.  Benjermin Korber,cma

## 2020-09-02 ENCOUNTER — Other Ambulatory Visit: Payer: Self-pay | Admitting: Family Medicine

## 2020-09-03 ENCOUNTER — Encounter: Payer: Self-pay | Admitting: Family Medicine

## 2020-09-03 ENCOUNTER — Other Ambulatory Visit: Payer: Self-pay

## 2020-09-03 ENCOUNTER — Ambulatory Visit (INDEPENDENT_AMBULATORY_CARE_PROVIDER_SITE_OTHER): Payer: Medicare Other | Admitting: Family Medicine

## 2020-09-03 ENCOUNTER — Telehealth: Payer: Self-pay | Admitting: Nurse Practitioner

## 2020-09-03 VITALS — BP 118/80 | HR 95 | Temp 99.0°F | Ht 63.0 in | Wt 225.4 lb

## 2020-09-03 DIAGNOSIS — K2100 Gastro-esophageal reflux disease with esophagitis, without bleeding: Secondary | ICD-10-CM

## 2020-09-03 DIAGNOSIS — S0990XA Unspecified injury of head, initial encounter: Secondary | ICD-10-CM | POA: Insufficient documentation

## 2020-09-03 DIAGNOSIS — R55 Syncope and collapse: Secondary | ICD-10-CM | POA: Diagnosis not present

## 2020-09-03 NOTE — Patient Instructions (Signed)
Nice to see you. Please avoid straining while having a bowel movement. I have referred you to GI for colonoscopy and endoscopy. If you develop headache, vision changes, numbness, weakness, or any new symptoms please seek medical attention.

## 2020-09-03 NOTE — Assessment & Plan Note (Signed)
Needs follow-up EGD.  Referral placed.

## 2020-09-03 NOTE — Progress Notes (Signed)
[name], MD Phone: [phone]  Bailey Brooks is a 69 y.o. female who presents today for same day visit.   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  She strained pretty significantly while on the toilet trying to have a bowel movement and stood up and passed out.  She noted no palpitations.  No chest pain.  No shortness of breath.  She did have a head injury and subsequently went to the emergency department on 08/30/2020.  She had CT head and cervical spine with no acute traumatic injuries noted.  She did not wait to be seen by a physician.  She did not have an EKG.  She does note some brief memory issues after the head injury though those resolved quickly and have not recurred.  She is had no headaches or nausea.  Social History   Tobacco Use  Smoking Status Never Smoker  Smokeless Tobacco Never Used     ROS see history of present illness  Objective  Physical Exam Vitals:   09/03/20 1513  BP: 118/80  Pulse: 95  Temp: 99 F (37.2 C)  SpO2: 95%    BP Readings from Last 3 Encounters:  09/03/20 118/80  08/30/20 131/77  08/25/20 (!) 117/53   Wt Readings from Last 3 Encounters:  09/03/20 225 lb 6.4 oz (102.2 kg)  08/30/20 227 lb (103 kg)  08/25/20 227 lb 4.8 oz (103.1 kg)    Physical Exam Constitutional:      General: She is not in acute distress.    Appearance: She is not diaphoretic.  HENT:     Head: No Battle's sign.     Comments: Bruising inferior to her eyes bilaterally, bruise over right forehead, no bony tenderness in her orbits or nose    Right Ear: No hemotympanum.     Left Ear: No hemotympanum.  Cardiovascular:     Rate and Rhythm: Normal rate and regular rhythm.     Heart sounds: Normal heart sounds.  Pulmonary:     Effort: Pulmonary effort is normal.     Breath sounds: Normal breath sounds.  Skin:    General: Skin is warm and dry.  Neurological:      Mental Status: She is alert.     Comments: PERRL, EOMI, hearing intact to finger rub, eyes open and close appropriately, sensation to light touch intact bilateral face, 5/5 strength in bilateral biceps, triceps, grip, quads, hamstrings, plantar and dorsiflexion, sensation to light touch intact in bilateral UE and LE, normal gait    EKG: Normal sinus rhythm, rate 78, no ischemic changes, no arrhythmia  Assessment/Plan: Please see individual problem list.  Head injury Negative CT head.  Negative CT neck.  Possibly had a very mild concussion given memory issues shortly after the head injury though has not had any recurrence of this and is not having any current symptoms.  She will monitor for any recurrent issues.  Advised to seek medical attention in the emergency department if she develops headache, vision changes, numbness, weakness, or any new symptoms.  Vasovagal syncope Symptoms are most consistent with vasovagal syncope.  EKG performed today and is reassuring.  Advised to avoid straining with BMs.  No further work-up needed.  GERD (gastroesophageal reflux disease) Needs follow-up EGD.  Referral placed.   Health Maintenance: Refer to GI for colonoscopy.  Orders Placed This Encounter  Procedures  . Ambulatory referral to Gastroenterology    Referral Priority:  Routine    Referral Type:   Consultation    Referral Reason:   Specialty Services Required    Number of Visits Requested:   1  . EKG 12-Lead    No orders of the defined types were placed in this encounter.   Evalise was seen today for follow-up.  Diagnoses and all orders for this visit:  Vasovagal syncope -     EKG 12-Lead  Injury of head, initial encounter  Gastroesophageal reflux disease with esophagitis without hemorrhage -     Ambulatory referral to Gastroenterology     This visit occurred during the SARS-CoV-2 public health emergency.  Safety protocols were in place, including screening questions prior to  the visit, additional usage of staff PPE, and extensive cleaning of exam room while observing appropriate contact time as indicated for disinfecting solutions.    [name], MD [place]

## 2020-09-03 NOTE — Assessment & Plan Note (Addendum)
Negative CT head.  Negative CT neck.  Possibly had a very mild concussion given memory issues shortly after the head injury though has not had any recurrence of this and is not having any current symptoms.  She will monitor for any recurrent issues.  Advised to seek medical attention in the emergency department if she develops headache, vision changes, numbness, weakness, or any new symptoms.

## 2020-09-03 NOTE — Assessment & Plan Note (Addendum)
Symptoms are most consistent with vasovagal syncope.  EKG performed today and is reassuring.  Advised to avoid straining with BMs.  No further work-up needed.

## 2020-09-03 NOTE — Telephone Encounter (Signed)
Pathology discussed with Dr. Theora Gianotti. Advised patient of her recommendation that she can stay on medication for total of 6 months of treatment or come off and we will watch closely. Left vm for patient to call to discuss.

## 2020-09-25 ENCOUNTER — Other Ambulatory Visit: Payer: Self-pay | Admitting: Internal Medicine

## 2020-09-29 ENCOUNTER — Other Ambulatory Visit: Payer: Self-pay

## 2020-09-29 ENCOUNTER — Encounter: Payer: Self-pay | Admitting: Family Medicine

## 2020-09-29 DIAGNOSIS — Z1239 Encounter for other screening for malignant neoplasm of breast: Secondary | ICD-10-CM

## 2020-09-29 DIAGNOSIS — Z1231 Encounter for screening mammogram for malignant neoplasm of breast: Secondary | ICD-10-CM

## 2020-10-04 ENCOUNTER — Inpatient Hospital Stay: Payer: Medicare Other | Attending: Internal Medicine | Admitting: Internal Medicine

## 2020-10-04 ENCOUNTER — Encounter: Payer: Self-pay | Admitting: Internal Medicine

## 2020-10-04 ENCOUNTER — Other Ambulatory Visit: Payer: Self-pay

## 2020-10-04 DIAGNOSIS — R5383 Other fatigue: Secondary | ICD-10-CM | POA: Insufficient documentation

## 2020-10-04 DIAGNOSIS — C50411 Malignant neoplasm of upper-outer quadrant of right female breast: Secondary | ICD-10-CM | POA: Diagnosis not present

## 2020-10-04 DIAGNOSIS — K219 Gastro-esophageal reflux disease without esophagitis: Secondary | ICD-10-CM | POA: Diagnosis not present

## 2020-10-04 DIAGNOSIS — Z79899 Other long term (current) drug therapy: Secondary | ICD-10-CM | POA: Insufficient documentation

## 2020-10-04 DIAGNOSIS — Z8349 Family history of other endocrine, nutritional and metabolic diseases: Secondary | ICD-10-CM | POA: Insufficient documentation

## 2020-10-04 DIAGNOSIS — Z923 Personal history of irradiation: Secondary | ICD-10-CM | POA: Insufficient documentation

## 2020-10-04 DIAGNOSIS — I509 Heart failure, unspecified: Secondary | ICD-10-CM | POA: Diagnosis not present

## 2020-10-04 DIAGNOSIS — Z170 Estrogen receptor positive status [ER+]: Secondary | ICD-10-CM | POA: Diagnosis not present

## 2020-10-04 DIAGNOSIS — Z79811 Long term (current) use of aromatase inhibitors: Secondary | ICD-10-CM | POA: Diagnosis not present

## 2020-10-04 DIAGNOSIS — M1990 Unspecified osteoarthritis, unspecified site: Secondary | ICD-10-CM | POA: Insufficient documentation

## 2020-10-04 DIAGNOSIS — Z7982 Long term (current) use of aspirin: Secondary | ICD-10-CM | POA: Insufficient documentation

## 2020-10-04 DIAGNOSIS — I110 Hypertensive heart disease with heart failure: Secondary | ICD-10-CM | POA: Diagnosis not present

## 2020-10-04 DIAGNOSIS — R5381 Other malaise: Secondary | ICD-10-CM | POA: Insufficient documentation

## 2020-10-04 DIAGNOSIS — R232 Flushing: Secondary | ICD-10-CM | POA: Diagnosis not present

## 2020-10-04 NOTE — Progress Notes (Signed)
Wants to ask about getting a mammogram.

## 2020-10-04 NOTE — Assessment & Plan Note (Addendum)
#  Right breast cancer stage I ER/PRPos; HER-2 negative.  Status post radiation. Currently on anastrozole. STABLE. Pt will call for refills.   # Fatigue/Emotional labile- sec to AI;  STABLE.   # Hot flashes- G-1- STABLE.   # Bone density test-February 2021-normal limits continue calcium plus vitamin D.   # DISPOSITION:call pt's cell phone # DIAGNOSTIC MAMMOGRAM BIL/right Breast US.  # Follow up in  6 months- MD; labs- cbc/cmp-  Dr.B  Cc; Dr.Sonnenberg.

## 2020-10-04 NOTE — Progress Notes (Signed)
Ba[facility] OFFICE PROGRESS NOTE  Patient Care Team: Leone Haven, MD as PCP - General (Family Medicine) Clent Jacks, RN as Registered Nurse  Cancer Staging No matching staging information was found for the patient.   Oncology History Overview Note  # RIGHT BREAST CANCER stage I [pTapN0; G-1; ER-100%; PR-99%; Her-2-NEG; Dr.Sakai]. 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   DIAGNOSIS: Right breast  STAGE:   I      ;  GOALS: cure  CURRENT/MOST RECENT THERAPY : anastrazole   History of lymphoma  Carcinoma of upper-outer quadrant of right breast in female, estrogen receptor positive ([place])  10/28/2019 Initial Diagnosis   Carcinoma of upper-outer quadrant of right breast in female, estrogen receptor positive ([place])   03/20/2020 Genetic Testing   Negative genetic testing on the multi-cancer panel.  The Multi-Gene Panel offered by [facility] includes sequencing and/or deletion duplication testing of the following 85 genes: AIP, ALK, APC, ATM, AXIN2,BAP1,  BARD1, BLM, BMPR1A, BRCA1, BRCA2, BRIP1, CASR, CDC73, CDH1, CDK4, CDKN1B, CDKN1C, CDKN2A (p14ARF), CDKN2A (p16INK4a), CEBPA, CHEK2, CTNNA1, DICER1, DIS3L2, EGFR (c.2369C>T, p.Thr790Met variant only), EPCAM (Deletion/duplication testing only), FH, FLCN, GATA2, GPC3, GREM1 (Promoter region deletion/duplication testing only), HOXB13 (c.251G>A, p.Gly84Glu), HRAS, KIT, MAX, MEN1, MET, MITF (c.952G>A, p.Glu318Lys variant only), MLH1, MSH2, MSH3, MSH6, MUTYH, NBN, NF1, NF2, NTHL1, PALB2, PDGFRA, PHOX2B, PMS2, POLD1, POLE, POT1, PRKAR1A, PTCH1,  PTEN, RAD50, RAD51C, RAD51D, RB1, RECQL4, RET, RNF43, RUNX1, SDHAF2, SDHA (sequence changes only), SDHB, SDHC, SDHD, SMAD4, SMARCA4, SMARCB1, SMARCE1, STK11, SUFU, TERC, TERT, TMEM127, TP53, TSC1, TSC2, VHL, WRN and WT1.  The report date is March 20, 2020.       INTERVAL HISTORY:  Bailey Brooks 69 y.o.  female pleasant patient stage I ER/PR positive HER-2 negative breast cancer on AI is here for follow-up.  Patient complains of mild hot flashes. Denies any worsening joint pains or bone pain. Her nausea is improved. She continues to be on anastrozole.   Review of Systems  Constitutional: Positive for malaise/fatigue. Negative for chills, diaphoresis and fever.  HENT: Negative for nosebleeds and sore throat.   Eyes: Negative for double vision.  Respiratory: Negative for cough, hemoptysis, sputum production, shortness of breath and wheezing.   Cardiovascular: Negative for chest pain, palpitations, orthopnea and leg swelling.  Gastrointestinal: Negative for abdominal pain, blood in stool, constipation, diarrhea, heartburn, melena, nausea and vomiting.  Genitourinary: Negative for dysuria, frequency and urgency.  Musculoskeletal: Positive for back pain and joint pain.  Skin: Negative.  Negative for itching and rash.  Neurological: Negative for dizziness, tingling, focal weakness, weakness and headaches.  Endo/Heme/Allergies: Does not bruise/bleed easily.  Psychiatric/Behavioral: Negative for depression. The patient is not nervous/anxious and does not have insomnia.       PAST MEDICAL HISTORY :  Past Medical History:  Diagnosis Date  . 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 Allergic rhinitis   . Anemia    WHILE GOING THRU CHEMO  . Arthritis   . CHF (congestive heart failure) ([place])    PT DENIES  . Diastolic dysfunction    a. 11/2016 Echo: EF 60-65%, no rwma, mild LVH, Gr1 DD,  triv MR, mildly dil LA, nl RV fxn.  . 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 Madelynn Done s/p RCHOP and methotrexate, c/b renal failure  . Diverticulosis   . Esophagitis   . Family history of breast cancer   . Family history of melanoma   . Family history of prostate cancer   . GERD (gastroesophageal reflux disease)   . H/O stem cell transplant ([place]) 06/2011   a. in setting of lymphoma.  . Hemorrhoids   . History of chemotherapy 22-Oct-2010   RHCOP/methotrexate-intrathecal  . History of stress test    a. 05/2010 Myoview: nl EF, no ischemia/infarct.  . Hypercholesterolemia   . Hypertension   . Hypothyroidism   . Morbid obesity ([place])   . 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 Paget disease, extra mammary    vulva, s/p resection 2014, Dr. Sabra Heck  . Paget's disease of vulva ([place])   . Pagets disease, extra-mammary   . TIA (transient ischemic attack)    X 2 -LAST ONE IN 2017    PAST SURGICAL HISTORY :   Past Surgical History:  Procedure Laterality Date  . ABDOMINAL HYSTERECTOMY  1985   Hysterectomy-partial  . BREAST BIOPSY  2002   Neg - AT [facility]  . BURR HOLE W/ PLACEMENT OMMAYA RESERVOIR    . BURR HOLE W/ PLACEMENT OMMAYA RESERVOIR  2012  . COLONOSCOPY  04/2013  . ESOPHAGOGASTRODUODENOSCOPY  04/2013  . INJECTION KNEE  07/02/2018  . INSERTION CENTRAL VENOUS ACCESS DEVICE W/ SUBCUTANEOUS PORT  2011   Port a Cath: Right chest Double Lumen, 04-Nov-2010  . LIMBAL STEM CELL TRANSPLANT  2012  . LIVER BIOPSY     stage 4B large Bcell lymphoma  . PARTIAL MASTECTOMY WITH NEEDLE LOCALIZATION AND AXILLARY SENTINEL LYMPH NODE BX Right 10/16/2019   Procedure: PARTIAL MASTECTOMY WITH NEEDLE LOCALIZATION AND AXILLARY SENTINEL LYMPH NODE BX;  Surgeon: Benjamine Sprague, DO;  Location: [facility] ORS;  Service: General;  Laterality: Right;  . ROTATOR CUFF REPAIR Right 2016  . SHOULDER ARTHROSCOPY Right 06/22/2015   Procedure: ARTHROSCOPY SHOULDER, parital repair of rotator cuff, biceps tenodesis, decompression and  debridement;  Surgeon: Corky Mull, MD;  Location: [facility] ORS;  Service: Orthopedics;  Laterality: Right;  . TONSILLECTOMY  1954  . VULVECTOMY    . VULVECTOMY PARTIAL N/A 09/29/2015   Procedure: VULVECTOMY PARTIAL;  Surgeon: Gillis Ends, MD;  Location: [facility] ORS;  Service: Gynecology;  Laterality: N/A;    FAMILY HISTORY :   Family History  Problem Relation Age of Onset  . Diabetes Mother        died @ 31 of MI.  Marland Kitchen Hypertension Mother   . Heart attack Mother   . Melanoma Father 43       died of complications r/t melanoma w/ lung mets.  . Kidney disease Sister        Kidney removed   . Breast cancer Sister 70  . Prostate cancer Brother        Prostate - dx in 38's  . Heart disease Brother 69       reported MI @ age 3, ? treated w/ TPA->no recurrent CAD, now in 51's.  Marland Kitchen Hearing loss Maternal Aunt   . Cancer Maternal Aunt        pancreatic vs colon cancer  . Hearing loss Maternal Grandfather   . Other Maternal Grandmother        flu  pandemic  . Kidney cancer Niece 56       partial nephrectomy    SOCIAL HISTORY:   Social History   Tobacco Use  . Smoking status: Never Smoker  . Smokeless tobacco: Never Used  Vaping Use  . Vaping Use: Never used  Substance Use Topics  . Alcohol use: No    Alcohol/week: 0.0 standard drinks  . Drug use: No    ALLERGIES:  is allergic to morphine and related, sulfa antibiotics, iodine, adhesive [tape], erythromycin, and oxycodone.  MEDICATIONS:  Current Outpatient Medications  Medication Sig Dispense Refill  . anastrozole (ARIMIDEX) 1 MG tablet TAKE 1 TABLET(1 MG) BY MOUTH DAILY 90 tablet 0  . aspirin EC 81 MG tablet Take 81 mg by mouth daily.    Marland Kitchen atorvastatin (LIPITOR) 80 MG tablet Take 1 tablet (80 mg total) by mouth daily. 90 tablet 3  . chlorthalidone (HYGROTON) 25 MG tablet TAKE 1/2 TABLET(12.5 MG) BY MOUTH DAILY 45 tablet 1  . cholecalciferol (VITAMIN D3) 25 MCG (1000 UT) tablet Take 1,000 Units by mouth daily.    Marland Kitchen  ezetimibe (ZETIA) 10 MG tablet Take 1 tablet (10 mg total) by mouth daily. 90 tablet 3  . lansoprazole (PREVACID) 30 MG capsule TAKE 1 CAPSULE(30 MG) BY MOUTH DAILY 90 capsule 1  . levothyroxine (SYNTHROID) 88 MCG tablet TAKE 1 TABLET BY MOUTH EVERY MORNING ON AN EMPTY STOMACH 90 tablet 3  . lisinopril (ZESTRIL) 10 MG tablet TAKE 1 TABLET(10 MG) BY MOUTH DAILY 90 tablet 3  . Multiple Vitamin (MULTIVITAMIN) tablet Take 1 tablet by mouth daily.    Marland Kitchen Specialty Vitamins Products (ICAPS LUTEIN & ZEAXANTHIN PO) Take by mouth every morning. Zeaxanthin 16m/Lutein 160m    . tacrolimus (PROTOPIC) 0.1 % ointment Apply 1-2 times daily as needed for itch to affected areas. 60 g 2  . imiquimod (ALDARA) 5 % cream Apply topically 3 (three) times a week. (Patient not taking: Reported on 10/04/2020) 12 each 1  . ketoconazole (NIZORAL) 2 % cream Apply twice daily as directed. (Patient not taking: Reported on 10/04/2020) 60 g 2  . mometasone (ELOCON) 0.1 % cream Apply 1 application topically daily as needed (eczema).  (Patient not taking: Reported on 10/04/2020)  0   No current facility-administered medications for this visit.   PHYSICAL EXAMINATION: ECOG PERFORMANCE STATUS: 0 - Asymptomatic  BP 137/79 (BP Location: Left Arm, Patient Position: Sitting, Cuff Size: Normal)   Pulse 82   Temp 98.5 F (36.9 C) (Tympanic)   Resp 16   Ht 5' 3"  (1.6 m)   Wt 228 lb (103.4 kg)   SpO2 98%   BMI 40.39 kg/m   Filed Weights   10/04/20 1004  Weight: 228 lb (103.4 kg)      Physical Activity:   . Days of Exercise per Week: Not on file  . Minutes of Exercise per Session: Not on file   Physical Exam HENT:     Head: Normocephalic and atraumatic.     Mouth/Throat:     Pharynx: No oropharyngeal exudate.  Eyes:     Pupils: Pupils are equal, round, and reactive to light.  Cardiovascular:     Rate and Rhythm: Normal rate and regular rhythm.  Pulmonary:     Effort: No respiratory distress.     Breath sounds: No  wheezing.  Abdominal:     General: Bowel sounds are normal. There is no distension.     Palpations: Abdomen is soft. There is no mass.  Tenderness: There is no abdominal tenderness. There is no guarding or rebound.  Musculoskeletal:        General: No tenderness. Normal range of motion.     Cervical back: Normal range of motion and neck supple.  Skin:    General: Skin is warm.  Neurological:     Mental Status: She is alert and oriented to person, place, and time.  Psychiatric:        Mood and Affect: Affect normal.    LABORATORY DATA:  I have reviewed the data as listed    Component Value Date/Time   NA 140 07/05/2020 0923   NA 138 09/03/2013 0809   K 3.5 07/05/2020 0923   K 4.3 11/02/2014 0932   CL 101 07/05/2020 0923   CL 105 09/03/2013 0809   CO2 29 07/05/2020 0923   CO2 28 09/03/2013 0809   GLUCOSE 131 (H) 07/05/2020 0923   GLUCOSE 100 (H) 09/03/2013 0809   BUN 21 07/05/2020 0923   BUN 17 09/03/2013 0809   CREATININE 0.97 07/05/2020 0923   CREATININE 0.96 11/02/2014 0929   CALCIUM 9.0 07/05/2020 0923   CALCIUM 9.1 09/03/2013 0809   PROT 6.7 07/05/2020 0923   PROT 6.6 11/02/2014 0929   ALBUMIN 3.5 07/05/2020 0923   ALBUMIN 3.3 (L) 11/02/2014 0929   AST 29 07/05/2020 0923   AST 16 11/02/2014 0929   ALT 30 07/05/2020 0923   ALT 19 11/02/2014 0929   ALKPHOS 75 07/05/2020 0923   ALKPHOS 87 11/02/2014 0929   BILITOT 0.6 07/05/2020 0923   BILITOT 0.3 11/02/2014 0929   GFRNONAA 59 (L) 07/05/2020 0923   GFRNONAA >60 11/02/2014 0929   GFRNONAA >60 07/27/2014 0836   GFRAA >60 07/05/2020 0923   GFRAA >60 11/02/2014 0929   GFRAA >60 07/27/2014 0836    No results found for: SPEP, UPEP  Lab Results  Component Value Date   WBC 3.7 (L) 07/05/2020   NEUTROABS 2.3 07/05/2020   HGB 12.2 07/05/2020   HCT 36.4 07/05/2020   MCV 85.8 07/05/2020   PLT 199 07/05/2020      Chemistry      Component Value Date/Time   NA 140 07/05/2020 0923   NA 138 09/03/2013 0809    K 3.5 07/05/2020 0923   K 4.3 11/02/2014 0932   CL 101 07/05/2020 0923   CL 105 09/03/2013 0809   CO2 29 07/05/2020 0923   CO2 28 09/03/2013 0809   BUN 21 07/05/2020 0923   BUN 17 09/03/2013 0809   CREATININE 0.97 07/05/2020 0923   CREATININE 0.96 11/02/2014 0929      Component Value Date/Time   CALCIUM 9.0 07/05/2020 0923   CALCIUM 9.1 09/03/2013 0809   ALKPHOS 75 07/05/2020 0923   ALKPHOS 87 11/02/2014 0929   AST 29 07/05/2020 0923   AST 16 11/02/2014 0929   ALT 30 07/05/2020 0923   ALT 19 11/02/2014 0929   BILITOT 0.6 07/05/2020 0923   BILITOT 0.3 11/02/2014 0929       RADIOGRAPHIC STUDIES: I have personally reviewed the radiological images as listed and agreed with the findings in the report. No results found.   ASSESSMENT & PLAN:  Carcinoma of upper-outer quadrant of right breast in female, estrogen receptor positive ([place]) # Right breast cancer stage I ER/PRPos; HER-2 negative.  Status post radiation. Currently on anastrozole. STABLE. Pt will call for refills.   # Fatigue/Emotional labile- sec to AI;  STABLE.   # Hot flashes- G-1- STABLE.   #  Bone density test-February 2021-normal limits continue calcium plus vitamin D.   # DISPOSITION:call pt's cell phone # DIAGNOSTIC MAMMOGRAM BIL/right Breast US.  # Follow up in  6 months- MD; labs- cbc/cmp-  Dr.B  Cc; Dr.Sonnenberg.    Orders Placed This Encounter  Procedures  . MM DIAG BREAST TOMO BILATERAL    Standing Status:   Future    Standing Expiration Date:   10/04/2021    Order Specific Question:   Reason for Exam (SYMPTOM  OR DIAGNOSIS REQUIRED)    Answer:   hx of breast cancer    Order Specific Question:   Preferred imaging location?    Answer:   [facility]  . US Breast Limited Uni Right Inc Axilla    Standing Status:   Future    Standing Expiration Date:   10/04/2021    Order Specific Question:   Reason for Exam (SYMPTOM  OR DIAGNOSIS REQUIRED)    Answer:   history of breast cancer    Order  Specific Question:   Preferred imaging location?    Answer:   [facility]  . CBC with Differential/Platelet    Standing Status:   Future    Standing Expiration Date:   10/04/2021  . Comprehensive metabolic panel    Standing Status:   Future    Standing Expiration Date:   10/04/2021   All questions were answered. The patient knows to call the clinic with any problems, questions or concerns.      Cammie Sickle, MD 10/04/2020 3:59 PM

## 2020-10-09 ENCOUNTER — Other Ambulatory Visit: Payer: Self-pay | Admitting: Family Medicine

## 2020-10-09 DIAGNOSIS — I10 Essential (primary) hypertension: Secondary | ICD-10-CM

## 2020-10-18 ENCOUNTER — Ambulatory Visit
Admission: RE | Admit: 2020-10-18 | Discharge: 2020-10-18 | Disposition: A | Discharge status: Home / Self Care | Payer: Medicare Other | Source: Ambulatory Visit | Attending: Internal Medicine | Admitting: Internal Medicine

## 2020-10-18 ENCOUNTER — Other Ambulatory Visit: Payer: Self-pay

## 2020-10-18 DIAGNOSIS — R922 Inconclusive mammogram: Secondary | ICD-10-CM | POA: Diagnosis not present

## 2020-10-18 DIAGNOSIS — Z170 Estrogen receptor positive status [ER+]: Secondary | ICD-10-CM | POA: Insufficient documentation

## 2020-10-18 DIAGNOSIS — C50411 Malignant neoplasm of upper-outer quadrant of right female breast: Secondary | ICD-10-CM | POA: Diagnosis not present

## 2020-10-18 HISTORY — DX: Personal history of irradiation: Z92.3

## 2020-11-04 ENCOUNTER — Encounter: Payer: Self-pay | Admitting: Family Medicine

## 2020-11-05 ENCOUNTER — Telehealth: Payer: Self-pay | Admitting: Family Medicine

## 2020-11-05 NOTE — Telephone Encounter (Signed)
Left message for patient to call back and schedule Medicare Annual Wellness Visit (AWV)  ° °This should be a telephone visit only=30 minutes. ° °No hx of AWV; please schedule at anytime with Denisa O'Brien-Blaney at [place] ° ° °

## 2020-12-09 DIAGNOSIS — K2100 Gastro-esophageal reflux disease with esophagitis, without bleeding: Secondary | ICD-10-CM | POA: Diagnosis not present

## 2020-12-09 DIAGNOSIS — Z86010 Personal history of colonic polyps: Secondary | ICD-10-CM | POA: Diagnosis not present

## 2020-12-09 DIAGNOSIS — Z8572 Personal history of non-Hodgkin lymphomas: Secondary | ICD-10-CM | POA: Diagnosis not present

## 2020-12-09 DIAGNOSIS — Z853 Personal history of malignant neoplasm of breast: Secondary | ICD-10-CM | POA: Diagnosis not present

## 2020-12-09 DIAGNOSIS — K449 Diaphragmatic hernia without obstruction or gangrene: Secondary | ICD-10-CM | POA: Diagnosis not present

## 2020-12-13 ENCOUNTER — Other Ambulatory Visit: Payer: Self-pay

## 2020-12-13 ENCOUNTER — Ambulatory Visit: Payer: Medicare Other | Admitting: Dermatology

## 2020-12-13 DIAGNOSIS — L820 Inflamed seborrheic keratosis: Secondary | ICD-10-CM | POA: Diagnosis not present

## 2020-12-13 DIAGNOSIS — L821 Other seborrheic keratosis: Secondary | ICD-10-CM | POA: Diagnosis not present

## 2020-12-13 DIAGNOSIS — L300 Nummular dermatitis: Secondary | ICD-10-CM | POA: Diagnosis not present

## 2020-12-13 NOTE — Patient Instructions (Addendum)
Seborrheic Keratosis  What causes seborrheic keratoses? Seborrheic keratoses are harmless, common skin growths that first appear during adult life.  As time goes by, more growths appear.  Some people may develop a large number of them.  Seborrheic keratoses appear on both covered and uncovered body parts.  They are not caused by sunlight.  The tendency to develop seborrheic keratoses can be inherited.  They vary in color from skin-colored to gray, brown, or even black.  They can be either smooth or have a rough, warty surface.   Seborrheic keratoses are superficial and look as if they were stuck on the skin.  Under the microscope this type of keratosis looks like layers upon layers of skin.  That is why at times the top layer may seem to fall off, but the rest of the growth remains and re-grows.    Treatment Seborrheic keratoses do not need to be treated, but can easily be removed in the office.  Seborrheic keratoses often cause symptoms when they rub on clothing or jewelry.  Lesions can be in the way of shaving.  If they become inflamed, they can cause itching, soreness, or burning.  Removal of a seborrheic keratosis can be accomplished by freezing, burning, or surgery. If any spot bleeds, scabs, or grows rapidly, please return to have it checked, as these can be an indication of a skin cancer.  Topical steroids (such as triamcinolone, fluocinolone, fluocinonide, mometasone, clobetasol, halobetasol, betamethasone, hydrocortisone) can cause thinning and lightening of the skin if they are used for too long in the same area. Your physician has selected the right strength medicine for your problem and area affected on the body. Please use your medication only as directed by your physician to prevent side effects.   Avoid applying to face, groin, and axilla. Use as directed. Risk of skin atrophy with long-term use reviewed.

## 2020-12-13 NOTE — Progress Notes (Signed)
   Follow-Up Visit   Subjective  Bailey Brooks is a 70 y.o. female who presents for the following: Recheck spots  (Patient here today for follow up on spots on right ankle and right wrist. She denies any concerns with areas. Patient reports itchy areas on upper back. States she has been using [facility]MC ointment. Would like area checked today. ).   The following portions of the chart were reviewed this encounter and updated as appropriate:      Objective  Well appearing patient in no apparent distress; mood and affect are within normal limits.  A focused examination was performed including the legs, wrist, and upper back. Relevant physical exam findings are noted in the Assessment and Plan.  Objective  right posterior ankle, right wrist: Stuck-on, waxy, tan-brown papules   Objective  right medial eyebrow: Waxy pink patch  Objective  Mid upper back: Small pink papules   Assessment & Plan  Seborrheic keratosis (2) right wrist; right posterior ankle  If irritation occurs apply tacrolimus ointment to area until improved, daily as needed  Discussed Ln2 if doesn't improve with topical treatment   Inflamed seborrheic keratosis right medial eyebrow  Has used  tacrolimus ointment but not resolved  Patient deferred treatment today for cryo    Nummular dermatitis Mid upper back  Continue tmc cream daily as needed Avoid applying to face, groin, and axilla. Use as directed. Risk of skin atrophy with long-term use reviewed.    Recommend mild soap and moisturizing cream 1-2 times daily.    Return for follow up as scheduled and as needed.  I, Ruthell Rummage, CMA, am acting as scribe for Brendolyn Patty, MD.  Documentation: I have reviewed the above documentation for accuracy and completeness, and I agree with the above.  Brendolyn Patty MD

## 2020-12-13 NOTE — Progress Notes (Deleted)
   Follow-Up Visit   Subjective  Bailey Brooks is a 70 y.o. female who presents for the following: Recheck spots  (Patient here today for follow up on spots on right ankle and right wrist. She denies any concerns with areas. ).   The following portions of the chart were reviewed this encounter and updated as appropriate:        Objective  Well appearing patient in no apparent distress; mood and affect are within normal limits.  {[id number]::"D full examination was performed including scalp, head, eyes, ears, nose, lips, neck, chest, axillae, abdomen, back, buttocks, bilateral upper extremities, bilateral lower extremities, hands, feet, fingers, toes, fingernails, and toenails. All findings within normal limits unless otherwise noted below."}   Assessment & Plan   Seborrheic Keratoses - Stuck-on, waxy, tan-brown papules and plaques  - Discussed benign etiology and prognosis. - Observe - Call for any changes  Actinic Damage - chronic, secondary to cumulative UV radiation exposure/sun exposure over time - diffuse scaly erythematous macules with underlying dyspigmentation - Recommend daily broad spectrum sunscreen SPF 30+ to sun-exposed areas, reapply every 2 hours as needed.  - Call for new or changing lesions.  No follow-ups on file.

## 2020-12-23 ENCOUNTER — Encounter: Payer: Self-pay | Admitting: Radiation Oncology

## 2020-12-23 ENCOUNTER — Ambulatory Visit
Admission: RE | Admit: 2020-12-23 | Discharge: 2020-12-23 | Disposition: A | Discharge status: Home / Self Care | Payer: Medicare Other | Source: Ambulatory Visit | Attending: Radiation Oncology | Admitting: Radiation Oncology

## 2020-12-23 VITALS — BP 143/79 | HR 68 | Temp 96.4°F | Resp 16 | Wt 226.3 lb

## 2020-12-23 DIAGNOSIS — Z923 Personal history of irradiation: Secondary | ICD-10-CM | POA: Insufficient documentation

## 2020-12-23 DIAGNOSIS — Z170 Estrogen receptor positive status [ER+]: Secondary | ICD-10-CM | POA: Diagnosis not present

## 2020-12-23 DIAGNOSIS — C50411 Malignant neoplasm of upper-outer quadrant of right female breast: Secondary | ICD-10-CM | POA: Diagnosis not present

## 2020-12-23 DIAGNOSIS — Z79811 Long term (current) use of aromatase inhibitors: Secondary | ICD-10-CM | POA: Diagnosis not present

## 2020-12-23 DIAGNOSIS — Z08 Encounter for follow-up examination after completed treatment for malignant neoplasm: Secondary | ICD-10-CM | POA: Diagnosis not present

## 2020-12-23 NOTE — Progress Notes (Signed)
Radiation Oncology Follow up Note  Name: Bailey Brooks   Date:   12/23/2020 MRN:  [id number] DOB: 07/19/1951    This 70 y.o. 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  He had mammograms back in November which I have reviewed were BI-RADS 2 benign.  She also had a fall back in September had cervical spine CT as well as CT of the head both showing no intracranial abnormality or any evidence of true traumatic injury to the cervical spine.  Patient is currently on Arimidex tolerating it well without side effect.  COMPLICATIONS OF TREATMENT: none  FOLLOW UP COMPLIANCE: keeps appointments   PHYSICAL EXAM:  BP (!) 143/79 (BP Location: Left Arm, Patient Position: Sitting)   Pulse 68   Temp (!) 96.4 F (35.8 C) (Tympanic)   Resp 16   Wt 226 lb 4.8 oz (102.6 kg)   BMI 40.09 kg/m  Lungs are clear to A&P cardiac examination essentially unremarkable with regular rate and rhythm. No dominant mass or nodularity is noted in either breast in 2 positions examined. Incision is well-healed. No axillary or supraclavicular adenopathy is appreciated. Cosmetic result is excellent.  Well-developed well-nourished patient in NAD. HEENT reveals PERLA, EOMI, discs not visualized.  Oral cavity is clear. No oral mucosal lesions are identified. Neck is clear without evidence of cervical or supraclavicular adenopathy. Lungs are clear to A&P. Cardiac examination is essentially unremarkable with regular rate and rhythm without murmur rub or thrill. Abdomen is benign with no organomegaly or masses noted. Motor sensory and DTR levels  are equal and symmetric in the upper and lower extremities. Cranial nerves II through XII are grossly intact. Proprioception is intact. No peripheral adenopathy or edema is identified. No motor or sensory levels are noted. Crude visual fields are within normal range.  RADIOLOGY RESULTS: Mammograms and CT scans of cervical spine and head all reviewed compatible with above-stated findings  PLAN: Present time patient is doing well with no evidence of disease 1 year out.  Her mammograms have been fine.  She continues on Arimidex without side effect.  I have asked to see her back in 1 year for follow-up.  Patient knows to call with any concerns.  I would like to take this opportunity to thank you for allowing me to participate in the care of your patient.Noreene Filbert, MD

## 2021-01-05 ENCOUNTER — Other Ambulatory Visit: Payer: Self-pay | Admitting: Internal Medicine

## 2021-01-13 DIAGNOSIS — Z01818 Encounter for other preprocedural examination: Secondary | ICD-10-CM | POA: Diagnosis not present

## 2021-01-14 DIAGNOSIS — K635 Polyp of colon: Secondary | ICD-10-CM | POA: Diagnosis not present

## 2021-01-14 DIAGNOSIS — I10 Essential (primary) hypertension: Secondary | ICD-10-CM | POA: Diagnosis not present

## 2021-01-14 DIAGNOSIS — K5730 Diverticulosis of large intestine without perforation or abscess without bleeding: Secondary | ICD-10-CM | POA: Diagnosis not present

## 2021-01-14 DIAGNOSIS — K640 First degree hemorrhoids: Secondary | ICD-10-CM | POA: Diagnosis not present

## 2021-01-14 DIAGNOSIS — D124 Benign neoplasm of descending colon: Secondary | ICD-10-CM | POA: Diagnosis not present

## 2021-01-14 DIAGNOSIS — K219 Gastro-esophageal reflux disease without esophagitis: Secondary | ICD-10-CM | POA: Diagnosis not present

## 2021-01-14 DIAGNOSIS — G4733 Obstructive sleep apnea (adult) (pediatric): Secondary | ICD-10-CM | POA: Diagnosis not present

## 2021-01-14 DIAGNOSIS — K449 Diaphragmatic hernia without obstruction or gangrene: Secondary | ICD-10-CM | POA: Diagnosis not present

## 2021-01-14 DIAGNOSIS — Z86010 Personal history of colonic polyps: Secondary | ICD-10-CM | POA: Diagnosis not present

## 2021-01-14 DIAGNOSIS — Z1211 Encounter for screening for malignant neoplasm of colon: Secondary | ICD-10-CM | POA: Diagnosis not present

## 2021-01-14 LAB — HM COLONOSCOPY

## 2021-01-28 ENCOUNTER — Ambulatory Visit (INDEPENDENT_AMBULATORY_CARE_PROVIDER_SITE_OTHER): Payer: Medicare Other | Admitting: Family Medicine

## 2021-01-28 ENCOUNTER — Other Ambulatory Visit: Payer: Self-pay

## 2021-01-28 ENCOUNTER — Encounter: Payer: Self-pay | Admitting: Family Medicine

## 2021-01-28 ENCOUNTER — Ambulatory Visit (INDEPENDENT_AMBULATORY_CARE_PROVIDER_SITE_OTHER): Payer: Medicare Other

## 2021-01-28 VITALS — BP 130/80 | HR 78 | Temp 98.6°F | Ht 63.0 in | Wt 231.8 lb

## 2021-01-28 DIAGNOSIS — E785 Hyperlipidemia, unspecified: Secondary | ICD-10-CM | POA: Diagnosis not present

## 2021-01-28 DIAGNOSIS — I10 Essential (primary) hypertension: Secondary | ICD-10-CM

## 2021-01-28 DIAGNOSIS — R7303 Prediabetes: Secondary | ICD-10-CM

## 2021-01-28 DIAGNOSIS — M25551 Pain in right hip: Secondary | ICD-10-CM

## 2021-01-28 DIAGNOSIS — R0989 Other specified symptoms and signs involving the circulatory and respiratory systems: Secondary | ICD-10-CM

## 2021-01-28 MED ORDER — LISINOPRIL 20 MG PO TABS: 20.0000 mg | ORAL_TABLET | Freq: Every day | ORAL | 1 refills | Status: DC

## 2021-01-28 MED ORDER — NAPROXEN 500 MG PO TABS: 500.0000 mg | ORAL_TABLET | Freq: Two times a day (BID) | ORAL | 0 refills | Status: DC | PRN

## 2021-01-28 NOTE — Patient Instructions (Addendum)
Nice to see. We will get an x-ray today of your hip. Please try the Naprosyn to see if this is beneficial for your hip pain.  You should take it on a schedule for the next 4 days and then you can use it twice daily as needed. We will increase your lisinopril to 20 mg once daily.  We will see you back in 1 week for recheck of labs and blood pressure. Someone will call you to get you scheduled for the ultrasound of your carotid arteries. You are up-to-date on your pneumonia vaccine.   You can get the Shingrix vaccine.

## 2021-01-28 NOTE — Assessment & Plan Note (Signed)
Above goal.  We will increase her lisinopril to 20 mg once daily.  She will continue chlorthalidone 25 mg daily.  She will monitor her blood pressure and watch for lightheadedness.  If lightheadedness occurs she will let us know.  Lab work in 1 week.

## 2021-01-28 NOTE — Assessment & Plan Note (Signed)
Check carotid Dopplers °

## 2021-01-28 NOTE — Assessment & Plan Note (Signed)
Undetermined cause.  Possibly related osteoarthritis though could represent a strain or labral issue.  We will get an x-ray.  We will treat with Naprosyn twice daily on a schedule for 4 days with food and then twice daily as needed following that.  If no improvement with this she will let us know.

## 2021-01-28 NOTE — Assessment & Plan Note (Signed)
Check lipid panel in 1 week.  Continue Lipitor 80 mg and Zetia 10 mg once daily.

## 2021-01-28 NOTE — Assessment & Plan Note (Signed)
Check A1c. 

## 2021-01-28 NOTE — Progress Notes (Signed)
[name], MD Phone: [phone]  Bailey Brooks is a 70 y.o. female who presents today for f/u.  Right hip pain: This been going on about 2 weeks.  Notes it got worse after she was laying down for her colonoscopy/EGD.  She notes when she is laying flat for in a recliner it is okay.  Hurts to walk.  Radiates around to her lateral hip from the anterior portion of her hip.  She has been taking 1 Tylenol and 1 ibuprofen about every 4 hours.  Difficulty squatting.  She does not want to do physical therapy as it has not been beneficial for anything else she has done it for in the past.  Hypertension: Typically 115-726 systolic.  Taking lisinopril chlorthalidone.  No chest pain, shortness breath, or edema.  Hyperlipidemia: Taking Lipitor and Zetia.  No myalgias.  She had a health check with the nurse at home and they reported left carotid bruit.  Social History   Tobacco Use  Smoking Status Never Smoker  Smokeless Tobacco Never Used    Current Outpatient Medications on File Prior to Visit  Medication Sig Dispense Refill  . anastrozole (ARIMIDEX) 1 MG tablet TAKE 1 TABLET(1 MG) BY MOUTH DAILY 90 tablet 0  . aspirin EC 81 MG tablet Take 81 mg by mouth daily.    Marland Kitchen atorvastatin (LIPITOR) 80 MG tablet Take 1 tablet (80 mg total) by mouth daily. 90 tablet 3  . chlorthalidone (HYGROTON) 25 MG tablet TAKE 1/2 TABLET(12.5 MG) BY MOUTH DAILY 45 tablet 1  . cholecalciferol (VITAMIN D3) 25 MCG (1000 UT) tablet Take 1,000 Units by mouth daily.    Marland Kitchen ezetimibe (ZETIA) 10 MG tablet Take 1 tablet (10 mg total) by mouth daily. 90 tablet 3  . imiquimod (ALDARA) 5 % cream Apply topically 3 (three) times a week. 12 each 1  . ketoconazole (NIZORAL) 2 % cream Apply twice daily as directed. 60 g 2  . lansoprazole (PREVACID) 30 MG capsule TAKE 1 CAPSULE(30 MG) BY MOUTH DAILY 90 capsule 1  . levothyroxine (SYNTHROID) 88 MCG tablet TAKE 1 TABLET BY MOUTH EVERY MORNING ON AN EMPTY STOMACH 90 tablet 3  .  mometasone (ELOCON) 0.1 % cream Apply 1 application topically daily as needed (eczema).  0  . Multiple Vitamin (MULTIVITAMIN) tablet Take 1 tablet by mouth daily.    Marland Kitchen Specialty Vitamins Products (ICAPS LUTEIN & ZEAXANTHIN PO) Take by mouth every morning. Zeaxanthin 67m/Lutein 127m   . tacrolimus (PROTOPIC) 0.1 % ointment Apply 1-2 times daily as needed for itch to affected areas. 60 g 2   No current facility-administered medications on file prior to visit.     ROS see history of present illness  Objective  Physical Exam Vitals:   01/28/21 0807  BP: 130/80  Pulse: 78  Temp: 98.6 F (37 C)  SpO2: 96%    BP Readings from Last 3 Encounters:  01/28/21 130/80  12/23/20 (!) 143/79  10/04/20 137/79   Wt Readings from Last 3 Encounters:  01/28/21 231 lb 12.8 oz (105.1 kg)  12/23/20 226 lb 4.8 oz (102.6 kg)  10/04/20 228 lb (103.4 kg)    Physical Exam Constitutional:      General: She is not in acute distress.    Appearance: She is not diaphoretic.  Neck:     Vascular: No carotid bruit.  Cardiovascular:     Rate and Rhythm: Normal rate and regular rhythm.     Heart sounds: Normal heart sounds.  Pulmonary:  Effort: Pulmonary effort is normal.     Breath sounds: Normal breath sounds.  Musculoskeletal:        General: No edema.     Comments: Fulton Mole, CMA served as chaperone, right hip with no tenderness anteriorly or laterally or posteriorly, patient has discomfort on hip flexion, internal rotation, and external rotation that limits evaluation of range of motion  Skin:    General: Skin is warm and dry.  Neurological:     Mental Status: She is alert.      Assessment/Plan: Please see individual problem list.  Problem List Items Addressed This Visit    Bruit of left carotid artery    Check carotid Dopplers.      Relevant Orders   US Carotid Duplex Bilateral   Essential hypertension - Primary    Above goal.  We will increase her lisinopril to 20 mg once  daily.  She will continue chlorthalidone 25 mg daily.  She will monitor her blood pressure and watch for lightheadedness.  If lightheadedness occurs she will let us know.  Lab work in 1 week.      Relevant Medications   lisinopril (ZESTRIL) 20 MG tablet   Other Relevant Orders   Comp Met (CMET)   Hyperlipidemia    Check lipid panel in 1 week.  Continue Lipitor 80 mg and Zetia 10 mg once daily.      Relevant Medications   lisinopril (ZESTRIL) 20 MG tablet   Other Relevant Orders   Comp Met (CMET)   Lipid panel   Prediabetes    Check A1c.      Relevant Orders   HgB A1c   Right hip pain    Undetermined cause.  Possibly related osteoarthritis though could represent a strain or labral issue.  We will get an x-ray.  We will treat with Naprosyn twice daily on a schedule for 4 days with food and then twice daily as needed following that.  If no improvement with this she will let us know.      Relevant Medications   naproxen (NAPROSYN) 500 MG tablet   Other Relevant Orders   DG HIP UNILAT W OR W/O PELVIS 2-3 VIEWS RIGHT      This visit occurred during the SARS-CoV-2 public health emergency.  Safety protocols were in place, including screening questions prior to the visit, additional usage of staff PPE, and extensive cleaning of exam room while observing appropriate contact time as indicated for disinfecting solutions.    [name], MD [place]

## 2021-01-31 ENCOUNTER — Ambulatory Visit: Payer: Medicare Other | Admitting: Family Medicine

## 2021-02-01 ENCOUNTER — Other Ambulatory Visit: Payer: Self-pay | Admitting: Family

## 2021-02-01 DIAGNOSIS — M25559 Pain in unspecified hip: Secondary | ICD-10-CM

## 2021-02-04 ENCOUNTER — Ambulatory Visit (INDEPENDENT_AMBULATORY_CARE_PROVIDER_SITE_OTHER): Payer: Medicare Other

## 2021-02-04 ENCOUNTER — Other Ambulatory Visit: Payer: Self-pay

## 2021-02-04 DIAGNOSIS — R7303 Prediabetes: Secondary | ICD-10-CM | POA: Diagnosis not present

## 2021-02-04 DIAGNOSIS — I10 Essential (primary) hypertension: Secondary | ICD-10-CM

## 2021-02-04 DIAGNOSIS — E785 Hyperlipidemia, unspecified: Secondary | ICD-10-CM | POA: Diagnosis not present

## 2021-02-04 LAB — COMPREHENSIVE METABOLIC PANEL
ALT: 16 U/L (ref 0–35)
AST: 17 U/L (ref 0–37)
Albumin: 3.5 g/dL (ref 3.5–5.2)
Alkaline Phosphatase: 84 U/L (ref 39–117)
BUN: 21 mg/dL (ref 6–23)
CO2: 29 mEq/L (ref 19–32)
Calcium: 9.3 mg/dL (ref 8.4–10.5)
Chloride: 103 mEq/L (ref 96–112)
Creatinine, Ser: 0.82 mg/dL (ref 0.40–1.20)
GFR: 72.17 mL/min (ref >60.00)
Glucose, Bld: 131 mg/dL — ABNORMAL HIGH (ref 70–99)
Potassium: 3.9 mEq/L (ref 3.5–5.1)
Sodium: 140 mEq/L (ref 135–145)
Total Bilirubin: 0.4 mg/dL (ref 0.2–1.2)
Total Protein: 6.2 g/dL (ref 6.0–8.3)

## 2021-02-04 LAB — LIPID PANEL
Cholesterol: 115 mg/dL (ref 0–200)
HDL: 41.8 mg/dL (ref >39.00)
LDL Cholesterol: 47 mg/dL (ref 0–99)
NonHDL: 73.61
Total CHOL/HDL Ratio: 3
Triglycerides: 131 mg/dL (ref 0.0–149.0)
VLDL: 26.2 mg/dL (ref 0.0–40.0)

## 2021-02-04 LAB — HEMOGLOBIN A1C: Hgb A1c MFr Bld: 5.9 % (ref 4.6–6.5)

## 2021-02-04 NOTE — Progress Notes (Signed)
Patient is here for a BP check due to bp being high at last visit, as per patient.  Currently patients BP is 119/79 and BPM is 86.  Patient has no complaints of headaches, blurry vision, chest pain, arm pain, light headedness, dizziness, and nor jaw pain. Please see previous note for order.

## 2021-02-07 ENCOUNTER — Ambulatory Visit: Payer: Medicare Other | Admitting: Family Medicine

## 2021-02-13 ENCOUNTER — Other Ambulatory Visit: Payer: Self-pay | Admitting: Family Medicine

## 2021-02-13 DIAGNOSIS — I10 Essential (primary) hypertension: Secondary | ICD-10-CM

## 2021-02-16 DIAGNOSIS — M25551 Pain in right hip: Secondary | ICD-10-CM | POA: Diagnosis not present

## 2021-02-18 ENCOUNTER — Other Ambulatory Visit: Payer: Self-pay | Admitting: Family Medicine

## 2021-02-23 ENCOUNTER — Inpatient Hospital Stay: Payer: Medicare Other | Attending: Internal Medicine | Admitting: Nurse Practitioner

## 2021-02-23 VITALS — BP 142/75 | HR 80 | Temp 97.8°F | Resp 20 | Wt 232.5 lb

## 2021-02-23 DIAGNOSIS — Z853 Personal history of malignant neoplasm of breast: Secondary | ICD-10-CM | POA: Insufficient documentation

## 2021-02-23 DIAGNOSIS — Z79899 Other long term (current) drug therapy: Secondary | ICD-10-CM | POA: Insufficient documentation

## 2021-02-23 DIAGNOSIS — Z8572 Personal history of non-Hodgkin lymphomas: Secondary | ICD-10-CM | POA: Diagnosis not present

## 2021-02-23 DIAGNOSIS — Z7982 Long term (current) use of aspirin: Secondary | ICD-10-CM | POA: Diagnosis not present

## 2021-02-23 DIAGNOSIS — C4499 Other specified malignant neoplasm of skin, unspecified: Secondary | ICD-10-CM | POA: Diagnosis not present

## 2021-02-23 DIAGNOSIS — C519 Malignant neoplasm of vulva, unspecified: Secondary | ICD-10-CM | POA: Insufficient documentation

## 2021-02-23 DIAGNOSIS — I10 Essential (primary) hypertension: Secondary | ICD-10-CM | POA: Insufficient documentation

## 2021-02-23 NOTE — Progress Notes (Signed)
Gynecologic Oncology Interval Visit   Referring Provider: Dr. Kenton Kingfisher  Chief Concern: Recurrent paget's disease of the vulva  Subjective:  Bailey Brooks is a 70 y.o. female, diagnosed with Paget's of the vulva, s/p WLE and vulvar biopsy 09/29/2015, who returns to clinic to re-evaluation.   She completed imiquimod treatment on 08/14/20. At last appointment, post imiquimod, she underwent biopsy x 2 which were negative for Paget's. We discussed maintenance imiquimod vs monitoring. She elected close surveillance.   DIAGNOSIS:  A. VULVA, RIGHT; BIOPSY:  - BENIGN SQUAMOUS MUCOSA WITH MILD CHRONIC INFLAMMATION.  - NEGATIVE FOR DYSPLASIA AND MALIGNANCY.  - NEGATIVE FOR ACTIVE INFLAMMATION AND FUNGAL ELEMENTS.   B. VULVA, LEFT; BIOPSY:  - BENIGN SQUAMOUS MUCOSA WITH MILD CHRONIC INFLAMMATION.  - NEGATIVE FOR DYSPLASIA AND MALIGNANCY.  - NEGATIVE FOR ACTIVE INFLAMMATION AND FUNGAL ELEMENTS.   Comment:  The history of extramammary Paget's disease is noted. Immunohistochemical stain for CK7 (blocks A1 and B1) is negative.   Patient had vasovagal episode secondary to straining to have BM in September 2021 and was seen in ER. Concerned lidocaine for biopsy could have contributed. She was seen in ER and had negative head ct. Left frontal ventriculoperitoneal shunt was stable on imaging.   She continues surveillance for history of breast cancer. Today, she is asymptomatic and denies itching or vulvar pain.    Gynecologic Oncology History: Bailey Brooks has a history of localized vulvar Paget's disease.   04/2013- vulvar biopsy revealed Paget's disease               WLE, additional margins resected for positive disease on frozen evaluation.   Part A: VULVA, VAGINAL MARGIN:  - POSITIVE FOR EXTRAMAMMARY PAGET'S DISEASE  Part B: VULVA, POSTERIOR MARGIN:  - POSITIVE FOR EXTRAMAMMARY PAGET'S DISEASE  Part C: VULVA, RIGHT, PARTIAL VULVECTOMY:  - EXTRAMAMMARY PAGET'S DISEASE  Part D: VULVA, NEW  VAGINAL MARGIN:  - POSITIVE FOR EXTRAMAMMARY PAGET'S DISEASE  07/28/2015 for routine surveillance an area on the right vulva seemed more suspicious for recurrence. This was biopsied and confirmed recurrent Paget's disease.   She underwent repeat excision 09/29/15 A. VULVA; EXCISION:  - RARE CYTOKERATIN 7 POSITIVE INTRAEPIDERMAL CELLS, INTERPRETED AS RESIDUAL PAGET DISEASE.  - THE SURGICAL MARGINS ARE CLEAR.   B. VULVA, 11:00; BIOPSY:  - RARE CYTOKERATIN 7 POSITIVE INTRAEPIDERMAL CELLS, INTERPRETED AS PAGET DISEASE.   No additional issues since surgery.   She had vulvar biopsy on 05/09/17 which was negative:   DIAGNOSIS:  A. VULVA; BIOPSY:  - SKIN WITH FOCAL KERATOSIS AND HYPERGRANULOSIS - NEGATIVE FOR DYSPLASIA AND MALIGNANCY  Of note, she has had a h/o recurrent vulvar candidiasis. Prior to her vulvar surgery 09/2015 she received 5 weekly doses of oral diflucan for vulvar candidiasis. Postop she was treated for yeast infection again in 12/17 and then took one Diflucan a week for six months with good results.   Seen in gyn-onc clinic on 01/16/18 by Dr. Theora Gianotti. NED at that time.   She has swelling of her lower extremities which was evaluated by her PCP and thought to be related to amlodipine. She started lasix which has improved her symptoms. She was seen by Dr. 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   She saw Dr. Theora Gianotti on 01/29/2019 with symptoms concerning for candidiasis at that time but no evidence of recurrent disease.   Her blood pressure medications were recently changes due to swelling secondary to amlodipine.  No on lisinopril.   Previous biopsies were consistent with recurrent Paget's.   DIAGNOSIS:  A. VULVA, LEFT AT 3:00; PUNCH BIOPSY:  - BENIGN VULVAR TISSUE.  - NEGATIVE FOR DYSPLASIA AND MALIGNANCY.  DIAGNOSIS:  A. VULVA, RIGHT AT 7:00; PUNCH BIOPSY:  - EXTRAMAMMARY PAGET'S DISEASE OF THE VULVA.  - NEGATIVE  FOR DYSPLASIA AND MALIGNANCY.   In the interim she has completed surgery and radiation for stage I right breast cancer, ER/PR positive HER-2/neu negative. She completed radiation on 12/29/2019. Anastrozole was recommended and she plans to start in approximately late February.   At last visit we had recommended start Aldara but due to recent diagnosis of breast cancer at that time, patient requested to wait to start treatment and returns today to discuss options for management and consider starting Aldara. She is concerned about reaching site to be treated and tolerability. Her daughter-in-law is going to help her with application. Previously she had expressed desire to avoid surgery/Mohs.   01/16/20 started Imiquimod  completed 5 weeks of treatment then held 02/18/2020 due to ulceration, erythema, and pain. Restarted on 03/17/2020. We recommended to continue imiquimod for 16 weeks and add clobetasol, 0.05% cream to vulva on alternating days that she does not use imiquimod to help with inflammation and reduce risk of recurrent ulcer/skin breakdown. Completed treatment on 08/14/2020.    Problem List: Patient Active Problem List   Diagnosis Date Noted  . Bruit of left carotid artery 01/28/2021  . Head injury 09/03/2020  . Vasovagal syncope 09/03/2020  . Low serum vitamin B12 05/28/2020  . Fatigue 03/24/2020  . Genetic testing 03/23/2020  . Family history of breast cancer   . Family history of prostate cancer   . Family history of melanoma   . Carcinoma of upper-outer quadrant of right breast in female, estrogen receptor positive ([place]) 10/28/2019  . Stress 08/06/2019  . Breast cancer screening 08/06/2019  . Pain due to onychomycosis of toenails of both feet 06/09/2019  . Prediabetes 12/20/2018  . Macular degeneration 06/20/2018  . GERD (gastroesophageal reflux disease) 06/20/2018  . Hypersomnia 06/20/2018  . Chronic pain of right knee 06/20/2018  . Bruising 12/14/2017  . History of lymphoma  06/11/2017  . Vulvar lesion 05/16/2017  . Low back pain 04/03/2017  . History of transient ischemic attack (TIA) 02/10/2017  . Hypokalemia 02/10/2017  . Right hip pain 08/03/2016  . Essential hypertension 07/06/2016  . Left knee pain 06/28/2016  . Hypomagnesemia 03/02/2016  . Morbid obesity due to excess calories ([place]) 03/02/2016  . Adjustment disorder with mixed anxiety and depressed mood 05/25/2015  . 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 Eczema 02/27/2013  . Muscle cramps 11/13/2012  . Hypothyroidism 02/20/2012  . Hyperlipidemia 02/20/2012    Past Medical History: Past Medical History:  Diagnosis Date  . 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 Allergic rhinitis   . Anemia    WHILE GOING THRU CHEMO  . Arthritis   . Breast cancer ([place]) 10/2019   right breast ca  . CHF (congestive heart failure) ([place])    PT DENIES  . Diastolic dysfunction    a. 11/2016 Echo: EF 60-65%, no rwma, mild LVH, Gr1 DD, triv MR, mildly dil LA, nl RV fxn.  . 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 Madelynn Done s/p RCHOP and methotrexate, c/b renal failure  . Diverticulosis   . Esophagitis   .  Family history of breast cancer   . Family history of melanoma   . Family history of prostate cancer   . GERD (gastroesophageal reflux disease)   . H/O stem cell transplant ([place]) 06/2011   a. in setting of lymphoma.  . Hemorrhoids   . History of chemotherapy 22-Oct-2010   RHCOP/methotrexate-intrathecal  . History of stress test    a. 05/2010 Myoview: nl EF, no ischemia/infarct.  . Hypercholesterolemia   . Hypertension   . Hypothyroidism   . Morbid obesity ([place])   . 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 Paget disease, extra mammary    vulva, s/p resection 2014, Dr. Sabra Heck  . Paget's disease of vulva ([place])   . Pagets disease, extra-mammary   . Personal history of radiation  therapy 2020-2021   right breast ca  . TIA (transient ischemic attack)    X 2 -LAST ONE IN 2017    Past Surgical History: Past Surgical History:  Procedure Laterality Date  . ABDOMINAL HYSTERECTOMY  1985   Hysterectomy-partial  . BREAST BIOPSY Right 2002   Neg - AT [facility]  . BREAST BIOPSY Right 2020   bx done at [facility]?, IDC and DCIS  . BREAST LUMPECTOMY Right 10/16/2019   IDC and DCIS, negative LN  . BURR HOLE W/ PLACEMENT OMMAYA RESERVOIR    . BURR HOLE W/ PLACEMENT OMMAYA RESERVOIR  2012  . COLONOSCOPY  04/2013  . ESOPHAGOGASTRODUODENOSCOPY  04/2013  . INJECTION KNEE  07/02/2018  . INSERTION CENTRAL VENOUS ACCESS DEVICE W/ SUBCUTANEOUS PORT  2011   Port a Cath: Right chest Double Lumen, 04-Nov-2010  . LIMBAL STEM CELL TRANSPLANT  2012  . LIVER BIOPSY     stage 4B large Bcell lymphoma  . PARTIAL MASTECTOMY WITH NEEDLE LOCALIZATION AND AXILLARY SENTINEL LYMPH NODE BX Right 10/16/2019   Procedure: PARTIAL MASTECTOMY WITH NEEDLE LOCALIZATION AND AXILLARY SENTINEL LYMPH NODE BX;  Surgeon: Benjamine Sprague, DO;  Location: [facility] ORS;  Service: General;  Laterality: Right;  . ROTATOR CUFF REPAIR Right 2016  . SHOULDER ARTHROSCOPY Right 06/22/2015   Procedure: ARTHROSCOPY SHOULDER, parital repair of rotator cuff, biceps tenodesis, decompression and debridement;  Surgeon: Corky Mull, MD;  Location: [facility] ORS;  Service: Orthopedics;  Laterality: Right;  . TONSILLECTOMY  1954  . VULVECTOMY    . VULVECTOMY PARTIAL N/A 09/29/2015   Procedure: VULVECTOMY PARTIAL;  Surgeon: Gillis Ends, MD;  Location: [facility] ORS;  Service: Gynecology;  Laterality: N/A;    Past Gynecologic History:  S/p hysterectomy  OB History:  OB History  No obstetric history on file.    Family History: Family History  Problem Relation Age of Onset  . Diabetes Mother        died @ 48 of MI.  Marland Kitchen Hypertension Mother   . Heart attack Mother   . Melanoma Father 89       died of complications r/t melanoma w/ lung  mets.  . Kidney disease Sister        Kidney removed   . Breast cancer Sister 57  . Prostate cancer Brother        Prostate - dx in 66's  . Heart disease Brother 40       reported MI @ age 71, ? treated w/ TPA->no recurrent CAD, now in 60's.  Marland Kitchen Hearing loss Maternal Aunt   . Cancer Maternal Aunt        pancreatic vs colon cancer  . Hearing loss Maternal Grandfather   .  Other Maternal Grandmother        flu pandemic  . Kidney cancer Niece 46       partial nephrectomy    Social History: Social History   Socioeconomic History  . Marital status: Married    Spouse name: Not on file  . Number of children: 2  . Years of education: Not on file  . Highest education level: Not on file  Occupational History  . 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 Smoking status: Never Smoker  . Smokeless tobacco: Never Used  Vaping Use  . Vaping Use: Never used  Substance and Sexual Activity  . Alcohol use: No    Alcohol/week: 0.0 standard drinks  . Drug use: No  . Sexual activity: Not Currently    Partners: Male    Birth control/protection: Post-menopausal  Other Topics Concern  . Not on file  Social History Narrative   Lives in [place] with husband, has 2 grown sons.  No pets. 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   Right-handed   Caffeine: occasional caffeine free/diet soda or hot tea   Social Determinants of Health   Financial Resource Strain: Not on file  Food Insecurity: Not on file  Transportation Needs: Not on file  Physical Activity: Not on file  Stress: Not on file  Social Connections: Not on file  Intimate Partner Violence: Not on file    Allergies: Allergies  Allergen Reactions  . Morphine And Related Nausea And Vomiting and Nausea Only    Hallucinations  . Sulfa Antibiotics Other (See Comments)    Dizzy/Fainting  . Iodine Swelling    IV iodine (states now that this can be tolerated with Benadryl)  . Adhesive [Tape] Rash and  Other (See Comments)    Including Bandaids  . Erythromycin Nausea And Vomiting  . Oxycodone Nausea And Vomiting    Current Medications: Current Outpatient Medications  Medication Sig Dispense Refill  . anastrozole (ARIMIDEX) 1 MG tablet TAKE 1 TABLET(1 MG) BY MOUTH DAILY 90 tablet 0  . aspirin EC 81 MG tablet Take 81 mg by mouth daily.    Marland Kitchen atorvastatin (LIPITOR) 80 MG tablet Take 1 tablet (80 mg total) by mouth daily. 90 tablet 3  . chlorthalidone (HYGROTON) 25 MG tablet TAKE 1/2 TABLET(12.5 MG) BY MOUTH DAILY 45 tablet 1  . cholecalciferol (VITAMIN D3) 25 MCG (1000 UT) tablet Take 1,000 Units by mouth daily.    Marland Kitchen ezetimibe (ZETIA) 10 MG tablet TAKE 1 TABLET(10 MG) BY MOUTH DAILY 90 tablet 3  . ketoconazole (NIZORAL) 2 % cream Apply twice daily as directed. 60 g 2  . lansoprazole (PREVACID) 30 MG capsule TAKE 1 CAPSULE(30 MG) BY MOUTH DAILY 90 capsule 1  . levothyroxine (SYNTHROID) 88 MCG tablet TAKE 1 TABLET BY MOUTH EVERY MORNING ON AN EMPTY STOMACH 90 tablet 3  . lisinopril (ZESTRIL) 20 MG tablet Take 1 tablet (20 mg total) by mouth daily. 90 tablet 1  . mometasone (ELOCON) 0.1 % cream Apply 1 application topically daily as needed (eczema).  0  . Multiple Vitamin (MULTIVITAMIN) tablet Take 1 tablet by mouth daily.    . naproxen (NAPROSYN) 500 MG tablet Take 1 tablet (500 mg total) by mouth 2 (two) times daily as needed for moderate pain. Take with food. 30 tablet 0  . Specialty Vitamins Products (ICAPS LUTEIN & ZEAXANTHIN PO) Take by mouth every morning. Zeaxanthin 23m/Lutein 129m   . tacrolimus (PROTOPIC) 0.1 %  ointment Apply 1-2 times daily as needed for itch to affected areas. 60 g 2  . imiquimod (ALDARA) 5 % cream Apply topically 3 (three) times a week. 12 each 1   No current facility-administered medications for this visit.   Review of Systems General:  no complaints Skin: no complaints Eyes: no complaints HEENT: no complaints Breasts: no complaints Pulmonary: no  complaints Cardiac: no complaints Gastrointestinal: no complaints Genitourinary/Sexual: no complaints Ob/Gyn: no complaints Musculoskeletal: no complaints Hematology: no complaints Neurologic/Psych: no complaints   Objective:  Physical Examination:  BP (!) 142/75   Pulse 80   Temp 97.8 F (36.6 C)   Resp 20   Wt 232 lb 8 oz (105.5 kg)   SpO2 100%   BMI 41.19 kg/m    Body mass index is 41.19 kg/m.   ECOG Performance Status: 0 - Asymptomatic   GENERAL: Patient is a well appearing female in no acute distress HEENT:  PERRL, neck supple with midline trachea.  MSK:  No focal spinal tenderness to palpation. Full range of motion bilaterally in the upper extremities. EXTREMITIES:  No peripheral edema.   SKIN:  Clear with no obvious rashes or skin changes. No nail dyscrasia. NEURO:  Nonfocal. Well oriented.  Appropriate affect.  Pelvic: Chaperoned by nursing. EGBUS: multiple vulvar scars, well healed from previous resections. Unchanged. Very sharp demarcation between mucosa/vulvar tissue and epithelial tissue on right, unchanged. No ulcerations. Erythema of left labia major stable compared to previous exams. Vagina: no lesions, discharge, or bleeding. Cervix: absent.   Assessment:  Bailey Brooks is a 69 y.o. female diagnosed with recurrent Paget's disease of the vulva status post wide local excision with positive margins and subsequent recurrence in 07/2015.  Repeat wide local excision consistent with recurrent Paget's disease of the vulva status post wide local excision with negative margins.  Biopsy in 2018 was negative.  Biopsy in 2020 at 7:00 consistent with recurrent Paget's.  She deffered prior treatment due to breast cancer diagnosis and need for therapy. She has now completed treatment and she is ready to focus on management of Paget's disease.  01/16/20 started Imiquimod  completed 5 weeks of treatment then held 02/18/2020 due to ulceration, erythema, and pain. Restarted on  03/17/2020 and completed  08/14/2020. Negative biopsies 9/21. Opted for surveillance. NED today on exam.   History of recurrent vulvar candidiasis.  Right breast cancer-newly diagnosed with right breast cancer status post biopsy.  Plans for right lumpectomy on 10/16/2019 with Dr. Lysle Pearl.  She is followed by medical oncology/Dr. Rogue Bussing for possible adjuvant treatment.      Plan:   Problem List Items Addressed This Visit   None   Visit Diagnoses    Extramammary Paget disease    -  Primary     Again reviewed maintenance imiquimod vs surveillance. Patient elects for surveillance. She again expresses her desire to avoid invasive procedures and/or biopsies if possible.   If intolerant to treatment or refractory symptoms could consider vulvectomy with plastic surgery for skin graft versus Mohs surgery versus continued close surveillance versus imiquimod for maintenance therapy to prevent progression.  Patient does request female providers only.  Return to clinic in 6 months for re-evaluation.   Verlon Au, NP

## 2021-03-03 ENCOUNTER — Ambulatory Visit: Payer: Medicare Other

## 2021-03-03 ENCOUNTER — Telehealth: Payer: Self-pay

## 2021-03-03 NOTE — Telephone Encounter (Signed)
Unsuccessful attempt to reach patient for scheduled awv. No answer. Left message to cal the office back and reschedule.

## 2021-03-10 ENCOUNTER — Telehealth: Payer: Self-pay | Admitting: Family Medicine

## 2021-03-10 ENCOUNTER — Other Ambulatory Visit: Payer: Self-pay

## 2021-03-10 ENCOUNTER — Ambulatory Visit (INDEPENDENT_AMBULATORY_CARE_PROVIDER_SITE_OTHER): Payer: Medicare Other

## 2021-03-10 VITALS — Ht 63.0 in | Wt 232.0 lb

## 2021-03-10 DIAGNOSIS — Z0000 Encounter for general adult medical examination without abnormal findings: Secondary | ICD-10-CM | POA: Diagnosis not present

## 2021-03-10 DIAGNOSIS — M25551 Pain in right hip: Secondary | ICD-10-CM

## 2021-03-10 NOTE — Telephone Encounter (Signed)
Refill request Naproxen, pended for approval.  Started 01/28/21.  Refill 0.  3 month follow up 04/27/21 @ 8:30.

## 2021-03-10 NOTE — Progress Notes (Signed)
Subjective:   Bailey Brooks is a 70 y.o. female who presents for an Initial Medicare Annual Wellness Visit.  Review of Systems    No ROS.  Medicare Wellness Virtual Visit.   Cardiac Risk Factors include: advanced age (>50men, >64 women);hypertension     Objective:    Today's Vitals   03/10/21 1103  Weight: 232 lb (105.2 kg)  Height: 5\' 3"  (1.6 m)   Body mass index is 41.1 kg/m.  Advanced Directives 03/10/2021 02/23/2021 12/23/2020 08/30/2020 08/24/2020 04/21/2020 03/04/2020  Does Patient Have a Medical Advance Directive? Yes No No Yes Yes Yes Yes  Type of Paramedic of [place];Living will - - [place];Living will [place];Living will [place];Living will -  Does patient want to make changes to medical advance directive? No - Patient declined - - No - Patient declined Yes (MAU/Ambulatory/Procedural Areas - Information given) Yes (MAU/Ambulatory/Procedural Areas - Information given) No - Patient declined  Copy of [place] in Chart? No - copy requested - - No - copy requested - No - copy requested -  Would patient like information on creating a medical advance directive? - No - Patient declined No - Patient declined - - - -    Current Medications (verified) Outpatient Encounter Medications as of 03/10/2021  Medication Sig  . anastrozole (ARIMIDEX) 1 MG tablet TAKE 1 TABLET(1 MG) BY MOUTH DAILY  . aspirin EC 81 MG tablet Take 81 mg by mouth daily.  Marland Kitchen atorvastatin (LIPITOR) 80 MG tablet Take 1 tablet (80 mg total) by mouth daily.  . chlorthalidone (HYGROTON) 25 MG tablet TAKE 1/2 TABLET(12.5 MG) BY MOUTH DAILY  . cholecalciferol (VITAMIN D3) 25 MCG (1000 UT) tablet Take 1,000 Units by mouth daily.  Marland Kitchen ezetimibe (ZETIA) 10 MG tablet TAKE 1 TABLET(10 MG) BY MOUTH DAILY  . ketoconazole (NIZORAL) 2 % cream Apply twice daily as directed.  . lansoprazole (PREVACID) 30 MG capsule TAKE 1  CAPSULE(30 MG) BY MOUTH DAILY  . levothyroxine (SYNTHROID) 88 MCG tablet TAKE 1 TABLET BY MOUTH EVERY MORNING ON AN EMPTY STOMACH  . lisinopril (ZESTRIL) 20 MG tablet Take 1 tablet (20 mg total) by mouth daily.  . mometasone (ELOCON) 0.1 % cream Apply 1 application topically daily as needed (eczema).  . Multiple Vitamin (MULTIVITAMIN) tablet Take 1 tablet by mouth daily.  . naproxen (NAPROSYN) 500 MG tablet Take 1 tablet (500 mg total) by mouth 2 (two) times daily as needed for moderate pain. Take with food.  Marland Kitchen Specialty Vitamins Products (ICAPS LUTEIN & ZEAXANTHIN PO) Take by mouth every morning. Zeaxanthin 4mg /Lutein 10mg   . tacrolimus (PROTOPIC) 0.1 % ointment Apply 1-2 times daily as needed for itch to affected areas.   No facility-administered encounter medications on file as of 03/10/2021.    Allergies (verified) Morphine and related, Sulfa antibiotics, Iodine, Adhesive [tape], Erythromycin, and Oxycodone   History: Past Medical History:  Diagnosis Date  . 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 Allergic rhinitis   . Anemia    WHILE GOING THRU CHEMO  . Arthritis   . Breast cancer ([place]) 10/2019   right breast ca  . CHF (congestive heart failure) ([place])    PT DENIES  . Diastolic dysfunction    a. 11/2016 Echo: EF 60-65%, no rwma, mild LVH, Gr1 DD, triv MR, mildly dil LA, nl RV fxn.  . 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 Madelynn Done s/p RCHOP and methotrexate, c/b renal failure  . Diverticulosis   . Esophagitis   . Family history of breast cancer   . Family history of melanoma   . Family history of prostate cancer   . GERD (gastroesophageal reflux disease)   . H/O stem cell transplant ([place]) 06/2011   a. in setting of lymphoma.  . Hemorrhoids   . History of chemotherapy 22-Oct-2010   RHCOP/methotrexate-intrathecal  . History of stress test    a. 05/2010 Myoview: nl EF, no ischemia/infarct.  .  Hypercholesterolemia   . Hypertension   . Hypothyroidism   . Morbid obesity ([place])   . 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 Paget disease, extra mammary    vulva, s/p resection 2014, Dr. Sabra Heck  . 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 Pagets disease, extra-mammary   . Personal history of radiation therapy 2020-2021   right breast ca  . TIA (transient ischemic attack)    X 2 -LAST ONE IN 2017   Past Surgical History:  Procedure Laterality Date  . ABDOMINAL HYSTERECTOMY  1985   Hysterectomy-partial  . BREAST BIOPSY Right 2002   Neg - AT [facility]  . BREAST BIOPSY Right 2020   bx done at [facility]?, IDC and DCIS  . BREAST LUMPECTOMY Right 10/16/2019   IDC and DCIS, negative LN  . BURR HOLE W/ PLACEMENT OMMAYA RESERVOIR    . BURR HOLE W/ PLACEMENT OMMAYA RESERVOIR  2012  . COLONOSCOPY  04/2013  . ESOPHAGOGASTRODUODENOSCOPY  04/2013  . INJECTION KNEE  07/02/2018  . INSERTION CENTRAL VENOUS ACCESS DEVICE W/ SUBCUTANEOUS PORT  2011   Port a Cath: Right chest Double Lumen, 04-Nov-2010  . LIMBAL STEM CELL TRANSPLANT  2012  . LIVER BIOPSY     stage 4B large Bcell lymphoma  . PARTIAL MASTECTOMY WITH NEEDLE LOCALIZATION AND AXILLARY SENTINEL LYMPH NODE BX Right 10/16/2019   Procedure: PARTIAL MASTECTOMY WITH NEEDLE LOCALIZATION AND AXILLARY SENTINEL LYMPH NODE BX;  Surgeon: Benjamine Sprague, DO;  Location: [facility] ORS;  Service: General;  Laterality: Right;  . ROTATOR CUFF REPAIR Right 2016  . SHOULDER ARTHROSCOPY Right 06/22/2015   Procedure: ARTHROSCOPY SHOULDER, parital repair of rotator cuff, biceps tenodesis, decompression and debridement;  Surgeon: Corky Mull, MD;  Location: [facility] ORS;  Service: Orthopedics;  Laterality: Right;  . TONSILLECTOMY  1954  . VULVECTOMY    . VULVECTOMY PARTIAL N/A 09/29/2015   Procedure: VULVECTOMY PARTIAL;  Surgeon: Gillis Ends, MD;  Location: [facility] ORS;  Service: Gynecology;  Laterality: N/A;   Family History  Problem Relation Age of Onset  .  Diabetes Mother        died @ 100 of MI.  Marland Kitchen Hypertension Mother   . Heart attack Mother   . Melanoma Father 70       died of complications r/t melanoma w/ lung mets.  . Kidney disease Sister        Kidney removed   . Breast cancer Sister 28  . Prostate cancer Brother        Prostate - dx in 41's  . Heart disease Brother 36       reported MI @ age 82, ? treated w/ TPA->no recurrent CAD, now in 17's.  Marland Kitchen Hearing loss Maternal Aunt   . Cancer Maternal Aunt        pancreatic vs colon cancer  . Hearing loss Maternal Grandfather   .  Other Maternal Grandmother        flu pandemic  . Kidney cancer Niece 53       partial nephrectomy   Social History   Socioeconomic History  . Marital status: Married    Spouse name: Not on file  . Number of children: 2  . Years of education: Not on file  . Highest education level: Not on file  Occupational History  . 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 Smoking status: Never Smoker  . Smokeless tobacco: Never Used  Vaping Use  . Vaping Use: Never used  Substance and Sexual Activity  . Alcohol use: No    Alcohol/week: 0.0 standard drinks  . Drug use: No  . Sexual activity: Not Currently    Partners: Male    Birth control/protection: Post-menopausal  Other Topics Concern  . Not on file  Social History Narrative   Lives in [place] with husband, has 2 grown sons.  No pets. 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   Right-handed   Caffeine: occasional caffeine free/diet soda or hot tea   Social Determinants of Health   Financial Resource Strain: Low Risk   . Difficulty of Paying Living Expenses: Not hard at all  Food Insecurity: No Food Insecurity  . Worried About Charity fundraiser in the Last Year: Never true  . Ran Out of Food in the Last Year: Never true  Transportation Needs: No Transportation Needs  . Lack of Transportation (Medical): No  . Lack of Transportation (Non-Medical): No  Physical  Activity: Unknown  . Days of Exercise per Week: 0 days  . Minutes of Exercise per Session: Not on file  Stress: No Stress Concern Present  . Feeling of Stress : Only a little  Social Connections: Unknown  . Frequency of Communication with Friends and Family: Not on file  . Frequency of Social Gatherings with Friends and Family: Not on file  . Attends Religious Services: Not on file  . Active Member of Clubs or Organizations: Not on file  . Attends Archivist Meetings: Not on file  . Marital Status: Married    Tobacco Counseling Counseling given: Not Answered   Clinical Intake:  Pre-visit preparation completed: Yes        Diabetes: No  How often do you need to have someone help you when you read instructions, pamphlets, or other written materials from your doctor or pharmacy?: 1 - Never    Interpreter Needed?: No      Activities of Daily Living In your present state of health, do you have any difficulty performing the following activities: 03/10/2021  Hearing? N  Vision? N  Difficulty concentrating or making decisions? N  Walking or climbing stairs? N  Dressing or bathing? N  Doing errands, shopping? N  Preparing Food and eating ? N  Using the Toilet? N  In the past six months, have you accidently leaked urine? N  Do you have problems with loss of bowel control? N  Managing your Medications? N  Managing your Finances? N  Housekeeping or managing your Housekeeping? N  Some recent data might be hidden    Patient Care Team: Leone Haven, MD as PCP - General (Family Medicine) Clent Jacks, RN as Registered Nurse  Indicate any recent Central you may have received from other than [facility] providers in the past year (date may be approximate).     Assessment:  This is a routine wellness examination for [place].  I connected with Reisha today by telephone and verified that I am speaking with the correct person using two  identifiers. Location patient: home Location provider: work Persons participating in the virtual visit: patient, Marine scientist.    I discussed the limitations, risks, security and privacy concerns of performing an evaluation and management service by telephone and the availability of in person appointments. The patient expressed understanding and verbally consented to this telephonic visit.    Interactive audio and video telecommunications were attempted between this provider and patient, however failed, due to patient having technical difficulties OR patient did not have access to video capability.  We continued and completed visit with audio only.  Some vital signs may be absent or patient reported.   Hearing/Vision screen  Hearing Screening   125Hz  250Hz  500Hz  1000Hz  2000Hz  3000Hz  4000Hz  6000Hz  8000Hz   Right ear:           Left ear:           Comments: Patient is able to hear conversational tones without difficulty.  No issues reported.  Vision Screening Comments: Follow[facility] Wears corrective lenses Dry macular degeneration Visual acuity not assessed, virtual visit.  They have seen their ophthalmologist in the last 12 months.     Dietary issues and exercise activities discussed: Current Exercise Habits: The patient does not participate in regular exercise at present  Regular  Good fluid intake  Goals    . DIET - Low carb/reduced sugar intake     Healthy diet    . Increase physical activity     Stretching and chair exercises       Depression Screen PHQ 2/9 Scores 03/10/2021 01/28/2021 08/02/2020 03/24/2020 11/25/2019 08/06/2019 08/20/2017  PHQ - 2 Score 0 0 0 0 0 0 0    Fall Risk Fall Risk  03/10/2021 01/28/2021 08/02/2020 11/25/2019 08/06/2019  Falls in the past year? 0 0 0 0 0  Number falls in past yr: 0 0 0 0 0  Injury with Fall? 0 - - - -  Follow up Falls evaluation completed Falls evaluation completed Falls evaluation completed Falls evaluation completed Falls  evaluation completed    [place]: Handrails in use when climbing stairs? Yes Home free of loose throw rugs in walkways, pet beds, electrical cords, etc? Yes  Adequate lighting in your home to reduce risk of falls? Yes   ASSISTIVE DEVICES UTILIZED TO PREVENT FALLS: Use of a cane, walker or w/c? No   TIMED UP AND GO: Was the test performed? No .  Virtual visit.   Cognitive Function:  Patient is alert and oriented x3.  Denies difficulty focusing, making decisions, memory loss.  Enjoys games online like wordal, crossword puzzles and other brain health activities. 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  MMSE/6CIT deferred. Normal by direct communication/observation.       Immunizations Immunization History  Administered Date(s) Administered  . Hepatitis B 10/11/2012  . HiB (PRP-OMP) 10/11/2012  . IPV 10/11/2012  . Influenza Split 09/04/2011, 08/14/2012, 09/23/2014  . Influenza,inj,Quad PF,6+ Mos 08/16/2016, 08/20/2019  . Influenza-Unspecified 09/25/2017, 09/24/2018, 08/18/2020  . PFIZER(Purple Top)SARS-COV-2 Vaccination 01/30/2020, 02/24/2020, 07/25/2020  . Pneumococcal Conjugate-13 12/20/2018  . Pneumococcal Polysaccharide-23 03/07/2015  . Tdap 10/06/2012    Health Maintenance Health Maintenance  Topic Date Due  . COVID-19 Vaccine (4 - Booster for [place] series) 04/27/2021 (Originally 01/25/2021)  . INFLUENZA VACCINE  07/04/2021  . MAMMOGRAM  10/18/2021  . TETANUS/TDAP  10/11/2022  . COLONOSCOPY (Pts 45-34yrs Insurance coverage will need to be confirmed)  01/14/2031  . DEXA SCAN  Completed  . Hepatitis C Screening  Completed  . PNA vac Low Risk Adult  Completed  . HPV VACCINES  Aged Out   Colorectal cancer screening: Type of screening: Colonoscopy. Completed 01/14/21. Repeat every 10 years  Mammogram status: Completed 10/18/20. Repeat every year. Ordered 09/29/20.   Lung Cancer Screening: (Low Dose CT Chest recommended if Age  17-80 years, 30 pack-year currently smoking OR have quit w/in 15years.) does not qualify.   Vision Screening: Recommended annual ophthalmology exams for early detection of glaucoma and other disorders of the eye. Is the patient up to date with their annual eye exam?  Yes  Who is the provider or what is the name of the office in which the patient attends annual eye exams? [facility].  Dental Screening: Recommended annual dental exams for proper oral hygiene. Visits every 6 months.   Community Resource Referral / Chronic Care Management: CRR required this visit?  No   CCM required this visit?  No      Plan:   Keep all routine maintenance appointments.   Follow up 04/27/21 @ 8:30  I have personally reviewed and noted the following in the patient's chart:   . Medical and social history . Use of alcohol, tobacco or illicit drugs  . Current medications and supplements . Functional ability and status . Nutritional status . Physical activity . Advanced directives . List of other physicians . Hospitalizations, surgeries, and ER visits in previous 12 months . Vitals . Screenings to include cognitive, depression, and falls . Referrals and appointments  In addition, I have reviewed and discussed with patient certain preventive protocols, quality metrics, and best practice recommendations. A written personalized care plan for preventive services as Brooks as general preventive health recommendations were provided to patient via mychart.     Varney Biles, LPN   12/10/9148

## 2021-03-10 NOTE — Telephone Encounter (Signed)
lft vm for pt to call ofc regarding US carotid. thanks

## 2021-03-10 NOTE — Patient Instructions (Addendum)
Bailey Brooks , Thank you for taking time to come for your Medicare Wellness Visit. I appreciate your ongoing commitment to your health goals. Please review the following plan we discussed and let me know if I can assist you in the future.   These are the goals we discussed: Goals    . DIET - Low carb/reduced sugar intake     Healthy diet    . Increase physical activity     Stretching and chair exercises        This is a list of the screening recommended for you and due dates:  Health Maintenance  Topic Date Due  . COVID-19 Vaccine (4 - Booster for Pfizer series) 04/27/2021*  . Flu Shot  07/04/2021  . Mammogram  10/18/2021  . Tetanus Vaccine  10/11/2022  . Colon Cancer Screening  01/14/2031  . DEXA scan (bone density measurement)  Completed  .  Hepatitis C: One time screening is recommended by Center for Disease Control  (CDC) for  adults born from [birth year] through 1966.   Completed  . Pneumonia vaccines  Completed  . HPV Vaccine  Aged Out  *Topic was postponed. The date shown is not the original due date.    Immunizations Immunization History  Administered Date(s) Administered  . Hepatitis B 10/11/2012  . HiB (PRP-OMP) 10/11/2012  . IPV 10/11/2012  . Influenza Split 09/04/2011, 08/14/2012, 09/23/2014  . Influenza,inj,Quad PF,6+ Mos 08/16/2016, 08/20/2019  . Influenza-Unspecified 09/25/2017, 09/24/2018, 08/18/2020  . PFIZER(Purple Top)SARS-COV-2 Vaccination 01/30/2020, 02/24/2020, 07/25/2020  . Pneumococcal Conjugate-13 12/20/2018  . Pneumococcal Polysaccharide-23 03/07/2015  . Tdap 10/06/2012   Keep all routine maintenance appointments.   Follow up 04/27/21 @ 8:30  Advanced directives: End of life planning; Advance aging; Advanced directives discussed.  Copy of current HCPOA/Living Will requested.    Conditions/risks identified: none new.  Follow up in one year for your annual wellness visit.    Preventive Care 71 Years and Older, Female Preventive care refers to  lifestyle choices and visits with your health care provider that can promote health and wellness. What does preventive care include?  A yearly physical exam. This is also called an annual well check.  Dental exams once or twice a year.  Routine eye exams. Ask your health care provider how often you should have your eyes checked.  Personal lifestyle choices, including:  Daily care of your teeth and gums.  Regular physical activity.  Eating a healthy diet.  Avoiding tobacco and drug use.  Limiting alcohol use.  Practicing safe sex.  Taking low-dose aspirin every day.  Taking vitamin and mineral supplements as recommended by your health care provider. What happens during an annual well check? The services and screenings done by your health care provider during your annual well check will depend on your age, overall health, lifestyle risk factors, and family history of disease. Counseling  Your health care provider may ask you questions about your:  Alcohol use.  Tobacco use.  Drug use.  Emotional well-being.  Home and relationship well-being.  Sexual activity.  Eating habits.  History of falls.  Memory and ability to understand (cognition).  Work and work Statistician.  Reproductive health. Screening  You may have the following tests or measurements:  Height, weight, and BMI.  Blood pressure.  Lipid and cholesterol levels. These may be checked every 5 years, or more frequently if you are over 13 years old.  Skin check.  Lung cancer screening. You may have this screening every year starting at  age 18 if you have a 30-pack-year history of smoking and currently smoke or have quit within the past 15 years.  Fecal occult blood test (FOBT) of the stool. You may have this test every year starting at age 48.  Flexible sigmoidoscopy or colonoscopy. You may have a sigmoidoscopy every 5 years or a colonoscopy every 10 years starting at age 1.  Hepatitis C blood  test.  Hepatitis B blood test.  Sexually transmitted disease (STD) testing.  Diabetes screening. This is done by checking your blood sugar (glucose) after you have not eaten for a while (fasting). You may have this done every 1-3 years.  Bone density scan. This is done to screen for osteoporosis. You may have this done starting at age 67.  Mammogram. This may be done every 1-2 years. Talk to your health care provider about how often you should have regular mammograms. Talk with your health care provider about your test results, treatment options, and if necessary, the need for more tests. Vaccines  Your health care provider may recommend certain vaccines, such as:  Influenza vaccine. This is recommended every year.  Tetanus, diphtheria, and acellular pertussis (Tdap, Td) vaccine. You may need a Td booster every 10 years.  Zoster vaccine. You may need this after age 6.  Pneumococcal 13-valent conjugate (PCV13) vaccine. One dose is recommended after age 17.  Pneumococcal polysaccharide (PPSV23) vaccine. One dose is recommended after age 63. Talk to your health care provider about which screenings and vaccines you need and how often you need them. This information is not intended to replace advice given to you by your health care provider. Make sure you discuss any questions you have with your health care provider. Document Released: 12/17/2015 Document Revised: 08/09/2016 Document Reviewed: 09/21/2015 Elsevier Interactive Patient Education  2017 [place] Prevention in the Home Falls can cause injuries. They can happen to people of all ages. There are many things you can do to make your home safe and to help prevent falls. What can I do on the outside of my home?  Regularly fix the edges of walkways and driveways and fix any cracks.  Remove anything that might make you trip as you walk through a door, such as a raised step or threshold.  Trim any bushes or trees on the  path to your home.  Use bright outdoor lighting.  Clear any walking paths of anything that might make someone trip, such as rocks or tools.  Regularly check to see if handrails are loose or broken. Make sure that both sides of any steps have handrails.  Any raised decks and porches should have guardrails on the edges.  Have any leaves, snow, or ice cleared regularly.  Use sand or salt on walking paths during winter.  Clean up any spills in your garage right away. This includes oil or grease spills. What can I do in the bathroom?  Use night lights.  Install grab bars by the toilet and in the tub and shower. Do not use towel bars as grab bars.  Use non-skid mats or decals in the tub or shower.  If you need to sit down in the shower, use a plastic, non-slip stool.  Keep the floor dry. Clean up any water that spills on the floor as soon as it happens.  Remove soap buildup in the tub or shower regularly.  Attach bath mats securely with double-sided non-slip rug tape.  Do not have throw rugs and other things on  the floor that can make you trip. What can I do in the bedroom?  Use night lights.  Make sure that you have a light by your bed that is easy to reach.  Do not use any sheets or blankets that are too big for your bed. They should not hang down onto the floor.  Have a firm chair that has side arms. You can use this for support while you get dressed.  Do not have throw rugs and other things on the floor that can make you trip. What can I do in the kitchen?  Clean up any spills right away.  Avoid walking on wet floors.  Keep items that you use a lot in easy-to-reach places.  If you need to reach something above you, use a strong step stool that has a grab bar.  Keep electrical cords out of the way.  Do not use floor polish or wax that makes floors slippery. If you must use wax, use non-skid floor wax.  Do not have throw rugs and other things on the floor that can  make you trip. What can I do with my stairs?  Do not leave any items on the stairs.  Make sure that there are handrails on both sides of the stairs and use them. Fix handrails that are broken or loose. Make sure that handrails are as long as the stairways.  Check any carpeting to make sure that it is firmly attached to the stairs. Fix any carpet that is loose or worn.  Avoid having throw rugs at the top or bottom of the stairs. If you do have throw rugs, attach them to the floor with carpet tape.  Make sure that you have a light switch at the top of the stairs and the bottom of the stairs. If you do not have them, ask someone to add them for you. What else can I do to help prevent falls?  Wear shoes that:  Do not have high heels.  Have rubber bottoms.  Are comfortable and fit you well.  Are closed at the toe. Do not wear sandals.  If you use a stepladder:  Make sure that it is fully opened. Do not climb a closed stepladder.  Make sure that both sides of the stepladder are locked into place.  Ask someone to hold it for you, if possible.  Clearly mark and make sure that you can see:  Any grab bars or handrails.  First and last steps.  Where the edge of each step is.  Use tools that help you move around (mobility aids) if they are needed. These include:  Canes.  Walkers.  Scooters.  Crutches.  Turn on the lights when you go into a dark area. Replace any light bulbs as soon as they burn out.  Set up your furniture so you have a clear path. Avoid moving your furniture around.  If any of your floors are uneven, fix them.  If there are any pets around you, be aware of where they are.  Review your medicines with your doctor. Some medicines can make you feel dizzy. This can increase your chance of falling. Ask your doctor what other things that you can do to help prevent falls. This information is not intended to replace advice given to you by your health care  provider. Make sure you discuss any questions you have with your health care provider. Document Released: 09/16/2009 Document Revised: 04/27/2016 Document Reviewed: 12/25/2014 Elsevier Interactive Patient Education  2017 [place].

## 2021-03-11 DIAGNOSIS — Z23 Encounter for immunization: Secondary | ICD-10-CM | POA: Diagnosis not present

## 2021-03-11 MED ORDER — NAPROXEN 500 MG PO TABS: 500.0000 mg | ORAL_TABLET | Freq: Two times a day (BID) | ORAL | 0 refills | Status: DC | PRN

## 2021-03-15 ENCOUNTER — Other Ambulatory Visit: Payer: Self-pay | Admitting: Family Medicine

## 2021-04-04 ENCOUNTER — Inpatient Hospital Stay: Payer: Medicare Other | Attending: Internal Medicine

## 2021-04-04 ENCOUNTER — Encounter: Payer: Self-pay | Admitting: Internal Medicine

## 2021-04-04 ENCOUNTER — Inpatient Hospital Stay (HOSPITAL_BASED_OUTPATIENT_CLINIC_OR_DEPARTMENT_OTHER): Payer: Medicare Other | Admitting: Internal Medicine

## 2021-04-04 DIAGNOSIS — Z79811 Long term (current) use of aromatase inhibitors: Secondary | ICD-10-CM | POA: Insufficient documentation

## 2021-04-04 DIAGNOSIS — Z7951 Long term (current) use of inhaled steroids: Secondary | ICD-10-CM | POA: Insufficient documentation

## 2021-04-04 DIAGNOSIS — Z79899 Other long term (current) drug therapy: Secondary | ICD-10-CM | POA: Diagnosis not present

## 2021-04-04 DIAGNOSIS — I10 Essential (primary) hypertension: Secondary | ICD-10-CM | POA: Insufficient documentation

## 2021-04-04 DIAGNOSIS — Z7982 Long term (current) use of aspirin: Secondary | ICD-10-CM | POA: Diagnosis not present

## 2021-04-04 DIAGNOSIS — Z923 Personal history of irradiation: Secondary | ICD-10-CM | POA: Insufficient documentation

## 2021-04-04 DIAGNOSIS — Z8572 Personal history of non-Hodgkin lymphomas: Secondary | ICD-10-CM | POA: Diagnosis not present

## 2021-04-04 DIAGNOSIS — Z9221 Personal history of antineoplastic chemotherapy: Secondary | ICD-10-CM | POA: Insufficient documentation

## 2021-04-04 DIAGNOSIS — C50411 Malignant neoplasm of upper-outer quadrant of right female breast: Secondary | ICD-10-CM | POA: Insufficient documentation

## 2021-04-04 DIAGNOSIS — Z170 Estrogen receptor positive status [ER+]: Secondary | ICD-10-CM | POA: Diagnosis not present

## 2021-04-04 DIAGNOSIS — N951 Menopausal and female climacteric states: Secondary | ICD-10-CM | POA: Insufficient documentation

## 2021-04-04 LAB — COMPREHENSIVE METABOLIC PANEL
ALT: 19 U/L (ref 0–44)
AST: 20 U/L (ref 15–41)
Albumin: 3.6 g/dL (ref 3.5–5.0)
Alkaline Phosphatase: 85 U/L (ref 38–126)
Anion gap: 10 (ref 5–15)
BUN: 25 mg/dL — ABNORMAL HIGH (ref 8–23)
CO2: 29 mmol/L (ref 22–32)
Calcium: 9.3 mg/dL (ref 8.9–10.3)
Chloride: 101 mmol/L (ref 98–111)
Creatinine, Ser: 0.87 mg/dL (ref 0.44–1.00)
GFR, Estimated: >60 mL/min (ref >60)
Glucose, Bld: 117 mg/dL — ABNORMAL HIGH (ref 70–99)
Potassium: 4.1 mmol/L (ref 3.5–5.1)
Sodium: 140 mmol/L (ref 135–145)
Total Bilirubin: 0.6 mg/dL (ref 0.3–1.2)
Total Protein: 7 g/dL (ref 6.5–8.1)

## 2021-04-04 LAB — CBC WITH DIFFERENTIAL/PLATELET
Abs Immature Granulocytes: 0.01 10*3/uL (ref 0.00–0.07)
Basophils Absolute: 0 10*3/uL (ref 0.0–0.1)
Basophils Relative: 0 %
Eosinophils Absolute: 0.1 10*3/uL (ref 0.0–0.5)
Eosinophils Relative: 2 %
HCT: 40 % (ref 36.0–46.0)
Hemoglobin: 13 g/dL (ref 12.0–15.0)
Immature Granulocytes: 0 %
Lymphocytes Relative: 34 %
Lymphs Abs: 1.6 10*3/uL (ref 0.7–4.0)
MCH: 28.6 pg (ref 26.0–34.0)
MCHC: 32.5 g/dL (ref 30.0–36.0)
MCV: 88.1 fL (ref 80.0–100.0)
Monocytes Absolute: 0.3 10*3/uL (ref 0.1–1.0)
Monocytes Relative: 7 %
Neutro Abs: 2.6 10*3/uL (ref 1.7–7.7)
Neutrophils Relative %: 57 %
Platelets: 182 10*3/uL (ref 150–400)
RBC: 4.54 MIL/uL (ref 3.87–5.11)
RDW: 14 % (ref 11.5–15.5)
WBC: 4.6 10*3/uL (ref 4.0–10.5)
nRBC: 0 % (ref 0.0–0.2)

## 2021-04-04 NOTE — Assessment & Plan Note (Signed)
#  Right breast cancer stage I ER/PRPos; HER-2 negative. Currently on anastrozole. STABLE  # Fatigue/Emotional labile- sec to AI;  STABLE  # Hot flashes- G-1-STABLE  # Bone density test-February 2021-normal limits continue calcium plus vitamin D. Will order BMD at next viist.   # DISPOSITION: # Follow up in  6 months- MD; labs- cbc/cmp-  Dr.B

## 2021-04-04 NOTE — Progress Notes (Signed)
[place] OFFICE PROGRESS NOTE  Patient Care Team: Leone Haven, MD as PCP - General (Family Medicine) Clent Jacks, RN as Registered Nurse  Cancer Staging No matching staging information was found for the patient.   Oncology History Overview Note  # RIGHT BREAST CANCER stage I [pTapN0; G-1; ER-100%; PR-99%; Her-2-NEG; Dr.Sakai]. 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   DIAGNOSIS: Right breast  STAGE:   I      ;  GOALS: cure  CURRENT/MOST RECENT THERAPY : anastrazole   History of lymphoma  Carcinoma of upper-outer quadrant of right breast in female, estrogen receptor positive ([place])  10/28/2019 Initial Diagnosis   Carcinoma of upper-outer quadrant of right breast in female, estrogen receptor positive ([place])   03/20/2020 Genetic Testing   Negative genetic testing on the multi-cancer panel.  The Multi-Gene Panel offered by [facility] includes sequencing and/or deletion duplication testing of the following 85 genes: AIP, ALK, APC, ATM, AXIN2,BAP1,  BARD1, BLM, BMPR1A, BRCA1, BRCA2, BRIP1, CASR, CDC73, CDH1, CDK4, CDKN1B, CDKN1C, CDKN2A (p14ARF), CDKN2A (p16INK4a), CEBPA, CHEK2, CTNNA1, DICER1, DIS3L2, EGFR (c.2369C>T, p.Thr790Met variant only), EPCAM (Deletion/duplication testing only), FH, FLCN, GATA2, GPC3, GREM1 (Promoter region deletion/duplication testing only), HOXB13 (c.251G>A, p.Gly84Glu), HRAS, KIT, MAX, MEN1, MET, MITF (c.952G>A, p.Glu318Lys variant only), MLH1, MSH2, MSH3, MSH6, MUTYH, NBN, NF1, NF2, NTHL1, PALB2, PDGFRA, PHOX2B, PMS2, POLD1, POLE, POT1, PRKAR1A, PTCH1,  PTEN, RAD50, RAD51C, RAD51D, RB1, RECQL4, RET, RNF43, RUNX1, SDHAF2, SDHA (sequence changes only), SDHB, SDHC, SDHD, SMAD4, SMARCA4, SMARCB1, SMARCE1, STK11, SUFU, TERC, TERT, TMEM127, TP53, TSC1, TSC2, VHL, WRN and WT1.  The report date is March 20, 2020.       INTERVAL HISTORY:  Bailey Brooks 70 y.o.  female pleasant patient stage I ER/PR positive HER-2 negative breast cancer on AI is here for follow-up.  Patient denies any significant hot flashes.  Denies any worsening joint pains or bone pain.  No nausea no vomiting.  Appetite is good.  No weight loss.  She continues with anastrozole.   Denies any swelling in the legs.   Review of Systems  Constitutional: Positive for malaise/fatigue. Negative for chills, diaphoresis and fever.  HENT: Negative for nosebleeds and sore throat.   Eyes: Negative for double vision.  Respiratory: Negative for cough, hemoptysis, sputum production, shortness of breath and wheezing.   Cardiovascular: Negative for chest pain, palpitations, orthopnea and leg swelling.  Gastrointestinal: Negative for abdominal pain, blood in stool, constipation, diarrhea, heartburn, melena, nausea and vomiting.  Genitourinary: Negative for dysuria, frequency and urgency.  Musculoskeletal: Positive for back pain and joint pain.  Skin: Negative.  Negative for itching and rash.  Neurological: Negative for dizziness, tingling, focal weakness, weakness and headaches.  Endo/Heme/Allergies: Does not bruise/bleed easily.  Psychiatric/Behavioral: Negative for depression. The patient is not nervous/anxious and does not have insomnia.       PAST MEDICAL HISTORY :  Past Medical History:  Diagnosis Date  . 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 Allergic rhinitis   . Anemia    WHILE GOING THRU CHEMO  . Arthritis   . Breast cancer ([place]) 10/2019   right breast ca  .  CHF (congestive heart failure) ([place])    PT DENIES  .  Diastolic dysfunction    a. 11/2016 Echo: EF 60-65%, no rwma, mild LVH, Gr1 DD, triv MR, mildly dil LA, nl RV fxn.  . 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 Madelynn Done s/p RCHOP and methotrexate, c/b renal failure  . Diverticulosis   . Esophagitis   . Family history of breast cancer   . Family history of melanoma   . Family history of prostate cancer   . GERD (gastroesophageal reflux disease)   . H/O stem cell transplant ([place]) 06/2011   a. in setting of lymphoma.  . Hemorrhoids   . History of chemotherapy 22-Oct-2010   RHCOP/methotrexate-intrathecal  . History of stress test    a. 05/2010 Myoview: nl EF, no ischemia/infarct.  . Hypercholesterolemia   . Hypertension   . Hypothyroidism   . Morbid obesity ([place])   . 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 Paget disease, extra mammary    vulva, s/p resection 2014, Dr. Sabra Heck  . 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 Pagets disease, extra-mammary   . Personal history of radiation therapy 2020-2021   right breast ca  . TIA (transient ischemic attack)    X 2 -LAST ONE IN 2017    PAST SURGICAL HISTORY :   Past Surgical History:  Procedure Laterality Date  . ABDOMINAL HYSTERECTOMY  1985   Hysterectomy-partial  . BREAST BIOPSY Right 2002   Neg - AT [facility]  . BREAST BIOPSY Right 2020   bx done at [facility]?, IDC and DCIS  . BREAST LUMPECTOMY Right 10/16/2019   IDC and DCIS, negative LN  . BURR HOLE W/ PLACEMENT OMMAYA RESERVOIR    . BURR HOLE W/ PLACEMENT OMMAYA RESERVOIR  2012  . COLONOSCOPY  04/2013  . ESOPHAGOGASTRODUODENOSCOPY  04/2013  . INJECTION KNEE  07/02/2018  . INSERTION CENTRAL VENOUS ACCESS DEVICE W/ SUBCUTANEOUS PORT  2011   Port a Cath: Right chest Double Lumen, 04-Nov-2010  . LIMBAL STEM CELL TRANSPLANT  2012  . LIVER BIOPSY     stage 4B large Bcell lymphoma  . PARTIAL MASTECTOMY WITH NEEDLE LOCALIZATION AND AXILLARY SENTINEL LYMPH NODE BX Right 10/16/2019   Procedure: PARTIAL MASTECTOMY  WITH NEEDLE LOCALIZATION AND AXILLARY SENTINEL LYMPH NODE BX;  Surgeon: Benjamine Sprague, DO;  Location: [facility] ORS;  Service: General;  Laterality: Right;  . ROTATOR CUFF REPAIR Right 2016  . SHOULDER ARTHROSCOPY Right 06/22/2015   Procedure: ARTHROSCOPY SHOULDER, parital repair of rotator cuff, biceps tenodesis, decompression and debridement;  Surgeon: Corky Mull, MD;  Location: [facility] ORS;  Service: Orthopedics;  Laterality: Right;  . TONSILLECTOMY  1954  . VULVECTOMY    . VULVECTOMY PARTIAL N/A 09/29/2015   Procedure: VULVECTOMY PARTIAL;  Surgeon: Gillis Ends, MD;  Location: [facility] ORS;  Service: Gynecology;  Laterality: N/A;    FAMILY HISTORY :   Family History  Problem Relation Age of Onset  . Diabetes Mother        died @ 28 of MI.  Marland Kitchen Hypertension Mother   . Heart attack Mother   . Melanoma Father 57       died of complications r/t melanoma w/ lung mets.  . Kidney disease Sister        Kidney removed   . Breast cancer Sister 46  . Prostate cancer Brother        Prostate - dx in 72's  .  Heart disease Brother 56       reported MI @ age 6, ? treated w/ TPA->no recurrent CAD, now in 47's.  Marland Kitchen Hearing loss Maternal Aunt   . Cancer Maternal Aunt        pancreatic vs colon cancer  . Hearing loss Maternal Grandfather   . Other Maternal Grandmother        flu pandemic  . Kidney cancer Niece 37       partial nephrectomy    SOCIAL HISTORY:   Social History   Tobacco Use  . Smoking status: Never Smoker  . Smokeless tobacco: Never Used  Vaping Use  . Vaping Use: Never used  Substance Use Topics  . Alcohol use: No    Alcohol/week: 0.0 standard drinks  . Drug use: No    ALLERGIES:  is allergic to morphine and related, sulfa antibiotics, iodine, adhesive [tape], erythromycin, and oxycodone.  MEDICATIONS:  Current Outpatient Medications  Medication Sig Dispense Refill  . anastrozole (ARIMIDEX) 1 MG tablet TAKE 1 TABLET(1 MG) BY MOUTH DAILY 90 tablet 0  . aspirin EC 81  MG tablet Take 81 mg by mouth daily.    Marland Kitchen atorvastatin (LIPITOR) 80 MG tablet Take 1 tablet (80 mg total) by mouth daily. 90 tablet 3  . chlorthalidone (HYGROTON) 25 MG tablet TAKE 1/2 TABLET(12.5 MG) BY MOUTH DAILY 45 tablet 1  . cholecalciferol (VITAMIN D3) 25 MCG (1000 UT) tablet Take 1,000 Units by mouth daily.    Marland Kitchen ezetimibe (ZETIA) 10 MG tablet TAKE 1 TABLET(10 MG) BY MOUTH DAILY 90 tablet 3  . ketoconazole (NIZORAL) 2 % cream Apply twice daily as directed. 60 g 2  . lansoprazole (PREVACID) 30 MG capsule TAKE 1 CAPSULE(30 MG) BY MOUTH DAILY 90 capsule 1  . levothyroxine (SYNTHROID) 88 MCG tablet TAKE 1 TABLET BY MOUTH EVERY MORNING ON AN EMPTY STOMACH 90 tablet 3  . lisinopril (ZESTRIL) 20 MG tablet Take 1 tablet (20 mg total) by mouth daily. 90 tablet 1  . mometasone (ELOCON) 0.1 % cream Apply 1 application topically daily as needed (eczema).  0  . Multiple Vitamin (MULTIVITAMIN) tablet Take 1 tablet by mouth daily.    . naproxen (NAPROSYN) 500 MG tablet Take 1 tablet (500 mg total) by mouth 2 (two) times daily as needed for moderate pain. Take with food. 30 tablet 0  . Specialty Vitamins Products (ICAPS LUTEIN & ZEAXANTHIN PO) Take by mouth every morning. Zeaxanthin 36m/Lutein 167m   . tacrolimus (PROTOPIC) 0.1 % ointment Apply 1-2 times daily as needed for itch to affected areas. 60 g 2   No current facility-administered medications for this visit.   PHYSICAL EXAMINATION: ECOG PERFORMANCE STATUS: 0 - Asymptomatic  BP (!) 154/68 (BP Location: Right Arm, Patient Position: Sitting, Cuff Size: Normal)   Pulse 60   Temp 98.8 F (37.1 C) (Tympanic)   Resp 16   Ht _0  (1.6 m)   Wt 232 lb (105.2 kg)   SpO2 96%   BMI 41.10 kg/m   Filed Weights   04/04/21 1035  Weight: 232 lb (105.2 kg)    Physical Activity: Unknown  . Days of Exercise per Week: 0 days  . Minutes of Exercise per Session: Not on file   Physical Exam HENT:     Head: Normocephalic and atraumatic.      Mouth/Throat:     Pharynx: No oropharyngeal exudate.  Eyes:     Pupils: Pupils are equal, round, and reactive to light.  Cardiovascular:  Rate and Rhythm: Normal rate and regular rhythm.  Pulmonary:     Effort: No respiratory distress.     Breath sounds: No wheezing.  Abdominal:     General: Bowel sounds are normal. There is no distension.     Palpations: Abdomen is soft. There is no mass.     Tenderness: There is no abdominal tenderness. There is no guarding or rebound.  Musculoskeletal:        General: No tenderness. Normal range of motion.     Cervical back: Normal range of motion and neck supple.  Skin:    General: Skin is warm.  Neurological:     Mental Status: She is alert and oriented to person, place, and time.  Psychiatric:        Mood and Affect: Affect normal.    LABORATORY DATA:  I have reviewed the data as listed    Component Value Date/Time   NA 140 04/04/2021 0945   NA 138 09/03/2013 0809   K 4.1 04/04/2021 0945   K 4.3 11/02/2014 0932   CL 101 04/04/2021 0945   CL 105 09/03/2013 0809   CO2 29 04/04/2021 0945   CO2 28 09/03/2013 0809   GLUCOSE 117 (H) 04/04/2021 0945   GLUCOSE 100 (H) 09/03/2013 0809   BUN 25 (H) 04/04/2021 0945   BUN 17 09/03/2013 0809   CREATININE 0.87 04/04/2021 0945   CREATININE 0.96 11/02/2014 0929   CALCIUM 9.3 04/04/2021 0945   CALCIUM 9.1 09/03/2013 0809   PROT 7.0 04/04/2021 0945   PROT 6.6 11/02/2014 0929   ALBUMIN 3.6 04/04/2021 0945   ALBUMIN 3.3 (L) 11/02/2014 0929   AST 20 04/04/2021 0945   AST 16 11/02/2014 0929   ALT 19 04/04/2021 0945   ALT 19 11/02/2014 0929   ALKPHOS 85 04/04/2021 0945   ALKPHOS 87 11/02/2014 0929   BILITOT 0.6 04/04/2021 0945   BILITOT 0.3 11/02/2014 0929   GFRNONAA >60 04/04/2021 0945   GFRNONAA >60 11/02/2014 0929   GFRNONAA >60 07/27/2014 0836   GFRAA >60 07/05/2020 0923   GFRAA >60 11/02/2014 0929   GFRAA >60 07/27/2014 0836    No results found for: SPEP, UPEP  Lab Results   Component Value Date   WBC 4.6 04/04/2021   NEUTROABS 2.6 04/04/2021   HGB 13.0 04/04/2021   HCT 40.0 04/04/2021   MCV 88.1 04/04/2021   PLT 182 04/04/2021      Chemistry      Component Value Date/Time   NA 140 04/04/2021 0945   NA 138 09/03/2013 0809   K 4.1 04/04/2021 0945   K 4.3 11/02/2014 0932   CL 101 04/04/2021 0945   CL 105 09/03/2013 0809   CO2 29 04/04/2021 0945   CO2 28 09/03/2013 0809   BUN 25 (H) 04/04/2021 0945   BUN 17 09/03/2013 0809   CREATININE 0.87 04/04/2021 0945   CREATININE 0.96 11/02/2014 0929      Component Value Date/Time   CALCIUM 9.3 04/04/2021 0945   CALCIUM 9.1 09/03/2013 0809   ALKPHOS 85 04/04/2021 0945   ALKPHOS 87 11/02/2014 0929   AST 20 04/04/2021 0945   AST 16 11/02/2014 0929   ALT 19 04/04/2021 0945   ALT 19 11/02/2014 0929   BILITOT 0.6 04/04/2021 0945   BILITOT 0.3 11/02/2014 0929       RADIOGRAPHIC STUDIES: I have personally reviewed the radiological images as listed and agreed with the findings in the report. No results found.   ASSESSMENT &  PLAN:  Carcinoma of upper-outer quadrant of right breast in female, estrogen receptor positive ([place]) # Right breast cancer stage I ER/PRPos; HER-2 negative. Currently on anastrozole. STABLE  # Fatigue/Emotional labile- sec to AI;  STABLE  # Hot flashes- G-1-STABLE  # Bone density test-February 2021-normal limits continue calcium plus vitamin D. Will order BMD at next viist.   # DISPOSITION: # Follow up in  6 months- MD; labs- cbc/cmp-  Dr.B    Orders Placed This Encounter  Procedures  . CBC with Differential/Platelet    Standing Status:   Future    Standing Expiration Date:   04/04/2022  . Comprehensive metabolic panel    Standing Status:   Future    Standing Expiration Date:   04/04/2022  . CBC with Differential    Standing Status:   Future    Standing Expiration Date:   04/04/2022  . Comprehensive metabolic panel    Standing Status:   Future    Standing Expiration Date:    04/04/2022   All questions were answered. The patient knows to call the clinic with any problems, questions or concerns.      Cammie Sickle, MD 04/04/2021 3:34 PM

## 2021-04-06 ENCOUNTER — Other Ambulatory Visit: Payer: Self-pay | Admitting: Internal Medicine

## 2021-04-20 ENCOUNTER — Other Ambulatory Visit: Payer: Self-pay

## 2021-04-23 ENCOUNTER — Other Ambulatory Visit: Payer: Self-pay | Admitting: Family Medicine

## 2021-04-25 ENCOUNTER — Other Ambulatory Visit: Payer: Self-pay

## 2021-04-25 ENCOUNTER — Ambulatory Visit
Admission: RE | Admit: 2021-04-25 | Discharge: 2021-04-25 | Disposition: A | Discharge status: Home / Self Care | Payer: Medicare Other | Source: Ambulatory Visit | Attending: Family Medicine | Admitting: Family Medicine

## 2021-04-25 DIAGNOSIS — I6523 Occlusion and stenosis of bilateral carotid arteries: Secondary | ICD-10-CM | POA: Diagnosis not present

## 2021-04-25 DIAGNOSIS — R0989 Other specified symptoms and signs involving the circulatory and respiratory systems: Secondary | ICD-10-CM | POA: Diagnosis not present

## 2021-04-25 DIAGNOSIS — I10 Essential (primary) hypertension: Secondary | ICD-10-CM | POA: Diagnosis not present

## 2021-04-25 DIAGNOSIS — E782 Mixed hyperlipidemia: Secondary | ICD-10-CM | POA: Diagnosis not present

## 2021-04-27 ENCOUNTER — Ambulatory Visit (INDEPENDENT_AMBULATORY_CARE_PROVIDER_SITE_OTHER): Payer: Medicare Other | Admitting: Family Medicine

## 2021-04-27 ENCOUNTER — Other Ambulatory Visit: Payer: Self-pay

## 2021-04-27 ENCOUNTER — Other Ambulatory Visit: Payer: Self-pay | Admitting: Family Medicine

## 2021-04-27 ENCOUNTER — Encounter: Payer: Self-pay | Admitting: Family Medicine

## 2021-04-27 DIAGNOSIS — M25552 Pain in left hip: Secondary | ICD-10-CM | POA: Diagnosis not present

## 2021-04-27 DIAGNOSIS — E039 Hypothyroidism, unspecified: Secondary | ICD-10-CM

## 2021-04-27 DIAGNOSIS — R0989 Other specified symptoms and signs involving the circulatory and respiratory systems: Secondary | ICD-10-CM

## 2021-04-27 DIAGNOSIS — I10 Essential (primary) hypertension: Secondary | ICD-10-CM | POA: Diagnosis not present

## 2021-04-27 DIAGNOSIS — M25551 Pain in right hip: Secondary | ICD-10-CM

## 2021-04-27 LAB — BASIC METABOLIC PANEL
BUN: 27 mg/dL — ABNORMAL HIGH (ref 6–23)
CO2: 32 mEq/L (ref 19–32)
Calcium: 9.6 mg/dL (ref 8.4–10.5)
Chloride: 100 mEq/L (ref 96–112)
Creatinine, Ser: 0.89 mg/dL (ref 0.40–1.20)
GFR: 65.31 mL/min (ref >60.00)
Glucose, Bld: 116 mg/dL — ABNORMAL HIGH (ref 70–99)
Potassium: 3.8 mEq/L (ref 3.5–5.1)
Sodium: 139 mEq/L (ref 135–145)

## 2021-04-27 MED ORDER — NAPROXEN 500 MG PO TABS: 500.0000 mg | ORAL_TABLET | Freq: Two times a day (BID) | ORAL | 0 refills | Status: DC | PRN

## 2021-04-27 NOTE — Assessment & Plan Note (Signed)
Patient's right hip is no longer bothering her.  Her left hip is bothering her now.  This may be from her back.  She will contact orthopedics to schedule follow-up.  We will check kidney function today and if it is acceptable we can refill her naproxen to take on an as-needed basis.

## 2021-04-27 NOTE — Assessment & Plan Note (Signed)
Occasionally elevated though she is only checking once a week.  She will check daily for the next 2 weeks and then send [place] a list of her readings.  She will continue chlorthalidone 12.5 mg daily and lisinopril 20 mg daily.

## 2021-04-27 NOTE — Assessment & Plan Note (Signed)
Continue Synthroid 88 mcg once daily.  Plan for TSH check at next visit.

## 2021-04-27 NOTE — Patient Instructions (Signed)
Nice to see you. Please call the orthopedic office to schedule an appointment. Please check your blood pressure daily for the next 2 weeks and send me her readings.  If it is not at goal we will need to consider increasing 1 your medications. Will contact you with your lab results.

## 2021-04-27 NOTE — Assessment & Plan Note (Signed)
Ultrasound revealed minimal plaque with no flow-limiting lesions.  No need for further follow-up on this at this time.  Discussed risk factor management.

## 2021-04-27 NOTE — Progress Notes (Signed)
[name], MD Phone: [phone]  Bailey Brooks is a 70 y.o. female who presents today for f/u.  HYPERTENSION  Disease Monitoring  Home BP Monitoring similar to today thought sometimes up in to the 284X systolically Chest pain- no    Dyspnea- no Medications  Compliance-  Taking chlorthalidone, lisinopril.  Edema- no  HYPOTHYROIDISM Disease Monitoring Weight changes: no  Skin Changes: no Heat/Cold intolerance: chronic cold intolerance  Medication Monitoring Compliance:  Taking synthroid daily though ran out for 4 days due to a pharmacy issue.    Last TSH:   Lab Results  Component Value Date   TSH 1.086 07/05/2020   Carotid Doppler was read as minimal plaque with no significant flow abnormalities.  Left hip pain: Patient saw orthopedics for right hip discomfort.  It appears they felt like most of her discomfort was coming from her back.  She was taking naproxen and notes this has helped some.  She took about 1/day for about a month.  Has not taken in a week or so.  Notes the discomfort makes it difficult for her to get up and move around.  She also notes she was advised there was bursitis on the right side.  Based on the note it looks like the patient did not want to proceed with physical therapy or other medications at the time of that visit.    Social History   Tobacco Use  Smoking Status Never Smoker  Smokeless Tobacco Never Used    Current Outpatient Medications on File Prior to Visit  Medication Sig Dispense Refill  . anastrozole (ARIMIDEX) 1 MG tablet TAKE 1 TABLET(1 MG) BY MOUTH DAILY 90 tablet 0  . aspirin EC 81 MG tablet Take 81 mg by mouth daily.    Marland Kitchen atorvastatin (LIPITOR) 80 MG tablet Take 1 tablet (80 mg total) by mouth daily. 90 tablet 3  . chlorthalidone (HYGROTON) 25 MG tablet TAKE 1/2 TABLET(12.5 MG) BY MOUTH DAILY 45 tablet 1  . cholecalciferol (VITAMIN D3) 25 MCG (1000 UT) tablet Take 1,000 Units by mouth daily.    Marland Kitchen ezetimibe (ZETIA) 10 MG  tablet TAKE 1 TABLET(10 MG) BY MOUTH DAILY 90 tablet 3  . ketoconazole (NIZORAL) 2 % cream Apply twice daily as directed. 60 g 2  . lansoprazole (PREVACID) 30 MG capsule TAKE 1 CAPSULE(30 MG) BY MOUTH DAILY 90 capsule 1  . levothyroxine (SYNTHROID) 88 MCG tablet TAKE 1 TABLET BY MOUTH EVERY MORNING ON AN EMPTY STOMACH 90 tablet 3  . lisinopril (ZESTRIL) 20 MG tablet Take 1 tablet (20 mg total) by mouth daily. 90 tablet 1  . mometasone (ELOCON) 0.1 % cream Apply 1 application topically daily as needed (eczema).  0  . Multiple Vitamin (MULTIVITAMIN) tablet Take 1 tablet by mouth daily.    . naproxen (NAPROSYN) 500 MG tablet Take 1 tablet (500 mg total) by mouth 2 (two) times daily as needed for moderate pain. Take with food. 30 tablet 0  . Specialty Vitamins Products (ICAPS LUTEIN & ZEAXANTHIN PO) Take by mouth every morning. Zeaxanthin 4mg /Lutein 10mg     . tacrolimus (PROTOPIC) 0.1 % ointment Apply 1-2 times daily as needed for itch to affected areas. 60 g 2   No current facility-administered medications on file prior to visit.     ROS see history of present illness  Objective  Physical Exam Vitals:   04/27/21 0839  BP: 130/70  Pulse: 82  Temp: 98.6 F (37 C)  SpO2: 97%    BP Readings from  Last 3 Encounters:  04/27/21 130/70  04/04/21 (!) 154/68  02/23/21 (!) 142/75   Wt Readings from Last 3 Encounters:  04/27/21 234 lb 9.6 oz (106.4 kg)  04/04/21 232 lb (105.2 kg)  03/10/21 232 lb (105.2 kg)    Physical Exam Constitutional:      General: She is not in acute distress.    Appearance: She is not diaphoretic.  Cardiovascular:     Rate and Rhythm: Normal rate and regular rhythm.     Heart sounds: Normal heart sounds.  Pulmonary:     Effort: Pulmonary effort is normal.     Breath sounds: Normal breath sounds.  Skin:    General: Skin is warm and dry.  Neurological:     Mental Status: She is alert.      Assessment/Plan: Please see individual problem list.  Problem  List Items Addressed This Visit    Hypothyroidism    Continue Synthroid 88 mcg once daily.  Plan for TSH check at next visit.      Essential hypertension    Occasionally elevated though she is only checking once a week.  She will check daily for the next 2 weeks and then send [place] a list of her readings.  She will continue chlorthalidone 12.5 mg daily and lisinopril 20 mg daily.      Relevant Orders   Basic Metabolic Panel (BMET)   Left hip pain    Patient's right hip is no longer bothering her.  Her left hip is bothering her now.  This may be from her back.  She will contact orthopedics to schedule follow-up.  We will check kidney function today and if it is acceptable we can refill her naproxen to take on an as-needed basis.      RESOLVED: Bruit of left carotid artery    Ultrasound revealed minimal plaque with no flow-limiting lesions.  No need for further follow-up on this at this time.  Discussed risk factor management.          Return in about 3 months (around 07/28/2021) for Hypothyroidism with TSH check.  This visit occurred during the SARS-CoV-2 public health emergency.  Safety protocols were in place, including screening questions prior to the visit, additional usage of staff PPE, and extensive cleaning of exam room while observing appropriate contact time as indicated for disinfecting solutions.    [name], MD [place]

## 2021-04-29 ENCOUNTER — Other Ambulatory Visit: Payer: Self-pay | Admitting: Family Medicine

## 2021-04-29 DIAGNOSIS — E785 Hyperlipidemia, unspecified: Secondary | ICD-10-CM

## 2021-05-10 ENCOUNTER — Other Ambulatory Visit: Payer: Self-pay | Admitting: Family Medicine

## 2021-05-10 DIAGNOSIS — I10 Essential (primary) hypertension: Secondary | ICD-10-CM

## 2021-05-23 ENCOUNTER — Other Ambulatory Visit: Payer: Self-pay | Admitting: Family Medicine

## 2021-05-23 DIAGNOSIS — M25551 Pain in right hip: Secondary | ICD-10-CM

## 2021-06-20 ENCOUNTER — Encounter: Payer: Medicare Other | Admitting: Dermatology

## 2021-06-28 ENCOUNTER — Other Ambulatory Visit: Payer: Self-pay | Admitting: Family Medicine

## 2021-06-28 DIAGNOSIS — M25551 Pain in right hip: Secondary | ICD-10-CM

## 2021-07-07 ENCOUNTER — Other Ambulatory Visit: Payer: Self-pay | Admitting: Internal Medicine

## 2021-07-29 ENCOUNTER — Ambulatory Visit (INDEPENDENT_AMBULATORY_CARE_PROVIDER_SITE_OTHER): Payer: Medicare Other | Admitting: Family Medicine

## 2021-07-29 ENCOUNTER — Other Ambulatory Visit: Payer: Self-pay

## 2021-07-29 ENCOUNTER — Encounter: Payer: Self-pay | Admitting: Family Medicine

## 2021-07-29 VITALS — BP 130/70 | HR 79 | Temp 99.0°F | Ht 63.0 in | Wt 240.6 lb

## 2021-07-29 DIAGNOSIS — E785 Hyperlipidemia, unspecified: Secondary | ICD-10-CM

## 2021-07-29 DIAGNOSIS — M545 Low back pain, unspecified: Secondary | ICD-10-CM | POA: Diagnosis not present

## 2021-07-29 DIAGNOSIS — I10 Essential (primary) hypertension: Secondary | ICD-10-CM | POA: Diagnosis not present

## 2021-07-29 DIAGNOSIS — F439 Reaction to severe stress, unspecified: Secondary | ICD-10-CM

## 2021-07-29 DIAGNOSIS — R110 Nausea: Secondary | ICD-10-CM | POA: Diagnosis not present

## 2021-07-29 DIAGNOSIS — R7303 Prediabetes: Secondary | ICD-10-CM

## 2021-07-29 LAB — COMPREHENSIVE METABOLIC PANEL
ALT: 18 U/L (ref 0–35)
AST: 21 U/L (ref 0–37)
Albumin: 3.7 g/dL (ref 3.5–5.2)
Alkaline Phosphatase: 96 U/L (ref 39–117)
BUN: 22 mg/dL (ref 6–23)
CO2: 28 mEq/L (ref 19–32)
Calcium: 9.4 mg/dL (ref 8.4–10.5)
Chloride: 102 mEq/L (ref 96–112)
Creatinine, Ser: 0.93 mg/dL (ref 0.40–1.20)
GFR: 61.84 mL/min (ref >60.00)
Glucose, Bld: 115 mg/dL — ABNORMAL HIGH (ref 70–99)
Potassium: 3.9 mEq/L (ref 3.5–5.1)
Sodium: 139 mEq/L (ref 135–145)
Total Bilirubin: 0.5 mg/dL (ref 0.2–1.2)
Total Protein: 6.5 g/dL (ref 6.0–8.3)

## 2021-07-29 LAB — HEMOGLOBIN A1C: Hgb A1c MFr Bld: 6 % (ref 4.6–6.5)

## 2021-07-29 NOTE — Patient Instructions (Signed)
Nice to see you. We will check lab work today. Please remain off of Naprosyn for a week or so.  You can try it again at a different time today.  Please make sure you take this with food.  If you have recurrence of the nausea when you go back on the Naprosyn please let us know.

## 2021-07-29 NOTE — Assessment & Plan Note (Addendum)
Chronic issue.  Discussed the potential for seeing physical therapy if we are not able to get her back on Naprosyn.  Discussed trialing Naprosyn again after a week or so off of it particularly given that she had such a good benefit from it.

## 2021-07-29 NOTE — Assessment & Plan Note (Signed)
Likely related to the Naprosyn.  She will remain off of this for at least another week.  She can try it again at that time taking it at a different time of day with food.  If she has recurrence of the nausea she will discontinue the Naprosyn and she will let us know.

## 2021-07-29 NOTE — Assessment & Plan Note (Signed)
Continue Lipitor 80 mg once daily and Zetia 10 mg once daily.

## 2021-07-29 NOTE — Assessment & Plan Note (Signed)
Check A1c. 

## 2021-07-29 NOTE — Assessment & Plan Note (Signed)
Adequately controlled.  She will continue lisinopril 20 mg once daily and chlorthalidone 12.5 mg daily.

## 2021-07-29 NOTE — Progress Notes (Signed)
Eric Sonnenberg, MD Phone: [phone]  Bailey Brooks is a 70 y.o. female who presents today for f/u.  HYPERTENSION Disease Monitoring Home BP Monitoring not checking Chest pain- no    Dyspnea- no Medications Compliance-  taking chlorthalidone, lisinopril.   Edema- no BMET    Component Value Date/Time   NA 139 04/27/2021 0900   NA 138 09/03/2013 0809   K 3.8 04/27/2021 0900   K 4.3 11/02/2014 0932   CL 100 04/27/2021 0900   CL 105 09/03/2013 0809   CO2 32 04/27/2021 0900   CO2 28 09/03/2013 0809   GLUCOSE 116 (H) 04/27/2021 0900   GLUCOSE 100 (H) 09/03/2013 0809   BUN 27 (H) 04/27/2021 0900   BUN 17 09/03/2013 0809   CREATININE 0.89 04/27/2021 0900   CREATININE 0.96 11/02/2014 0929   CALCIUM 9.6 04/27/2021 0900   CALCIUM 9.1 09/03/2013 0809   GFRNONAA >60 04/04/2021 0945   GFRNONAA >60 11/02/2014 0929   GFRNONAA >60 07/27/2014 0836   GFRAA >60 07/05/2020 0923   GFRAA >60 11/02/2014 0929   GFRAA >60 07/27/2014 0836    HYPERLIPIDEMIA Symptoms Chest pain on exertion:  no   Medications: Compliance- taking lipitor, zetia Right upper quadrant pain- no  Muscle aches- no Lipid Panel     Component Value Date/Time   CHOL 115 02/04/2021 0929   TRIG 131.0 02/04/2021 0929   HDL 41.80 02/04/2021 0929   CHOLHDL 3 02/04/2021 0929   VLDL 26.2 02/04/2021 0929   LDLCALC 47 02/04/2021 0929   LDLDIRECT 60.0 05/24/2020 0814   Prediabetes: due for A1c.  Stress: Patient notes her husband just had a spinal cord stimulator placed.  Her youngest son is also in the middle of a divorce.  Her grandchildren and her son are at her house frequently.  She notes some anxiety.  She is unsure if she has depression.  No SI or HI.  She declines any medication for this.  Nausea: Patient notes mild nausea over the last week.  Notes she stopped her Naprosyn and the nausea has improved.  She was taking the Naprosyn with food.  No vomiting or diarrhea.  No abdominal pain.  No new medications or  supplements  Chronic back pain: She notes the Naprosyn was helping significantly.  She did see orthopedics previously and they recommended physical therapy though the patient does not have a lot of faith in physical therapy.  Social History   Tobacco Use  Smoking Status Never  Smokeless Tobacco Never    Current Outpatient Medications on File Prior to Visit  Medication Sig Dispense Refill   anastrozole (ARIMIDEX) 1 MG tablet TAKE 1 TABLET(1 MG) BY MOUTH DAILY 90 tablet 0   aspirin EC 81 MG tablet Take 81 mg by mouth daily.     atorvastatin (LIPITOR) 80 MG tablet TAKE 1 TABLET(80 MG) BY MOUTH DAILY 90 tablet 3   chlorthalidone (HYGROTON) 25 MG tablet TAKE 1/2 TABLET(12.5 MG) BY MOUTH DAILY 45 tablet 1   cholecalciferol (VITAMIN D3) 25 MCG (1000 UT) tablet Take 1,000 Units by mouth daily.     ezetimibe (ZETIA) 10 MG tablet TAKE 1 TABLET(10 MG) BY MOUTH DAILY 90 tablet 3   ketoconazole (NIZORAL) 2 % cream Apply twice daily as directed. 60 g 2   lansoprazole (PREVACID) 30 MG capsule TAKE 1 CAPSULE(30 MG) BY MOUTH DAILY 90 capsule 1   levothyroxine (SYNTHROID) 88 MCG tablet TAKE 1 TABLET BY MOUTH EVERY MORNING ON AN EMPTY STOMACH 90 tablet 3     lisinopril (ZESTRIL) 20 MG tablet Take 1 tablet (20 mg total) by mouth daily. 90 tablet 1   mometasone (ELOCON) 0.1 % cream Apply 1 application topically daily as needed (eczema).  0   Multiple Vitamin (MULTIVITAMIN) tablet Take 1 tablet by mouth daily.     naproxen (NAPROSYN) 500 MG tablet TAKE 1 TABLET(500 MG) BY MOUTH TWICE DAILY WITH FOOD AS NEEDED FOR MODERATE PAIN 30 tablet 0   Specialty Vitamins Products (ICAPS LUTEIN & ZEAXANTHIN PO) Take by mouth every morning. Zeaxanthin 15m/Lutein 138m    tacrolimus (PROTOPIC) 0.1 % ointment Apply 1-2 times daily as needed for itch to affected areas. 60 g 2   No current facility-administered medications on file prior to visit.     ROS see history of present illness  Objective  Physical Exam Vitals:    07/29/21 1005  BP: 130/70  Pulse: 79  Temp: 99 F (37.2 C)  SpO2: 99%    BP Readings from Last 3 Encounters:  07/29/21 130/70  04/27/21 130/70  04/04/21 (!) 154/68   Wt Readings from Last 3 Encounters:  07/29/21 240 lb 9.6 oz (109.1 kg)  04/27/21 234 lb 9.6 oz (106.4 kg)  04/04/21 232 lb (105.2 kg)    Physical Exam Constitutional:      General: She is not in acute distress.    Appearance: She is not diaphoretic.  Cardiovascular:     Rate and Rhythm: Normal rate and regular rhythm.     Heart sounds: Normal heart sounds.  Pulmonary:     Effort: Pulmonary effort is normal.     Breath sounds: Normal breath sounds.  Abdominal:     General: Bowel sounds are normal. There is no distension.     Palpations: Abdomen is soft.     Tenderness: There is no abdominal tenderness. There is no guarding or rebound.  Skin:    General: Skin is warm and dry.  Neurological:     Mental Status: She is alert.     Assessment/Plan: Please see individual problem list.  Problem List Items Addressed This Visit     Essential hypertension    Adequately controlled.  She will continue lisinopril 20 mg once daily and chlorthalidone 12.5 mg daily.      Hyperlipidemia    Continue Lipitor 80 mg once daily and Zetia 10 mg once daily.      Low back pain    Chronic issue.  Discussed the potential for seeing physical therapy if we are not able to get her back on Naprosyn.  Discussed trialing Naprosyn again after a week or so off of it particularly given that she had such a good benefit from it.      Nausea    Likely related to the Naprosyn.  She will remain off of this for at least another week.  She can try it again at that time taking it at a different time of day with food.  If she has recurrence of the nausea she will discontinue the Naprosyn and she will let [place].      Relevant Orders   Comp Met (CMET)   Prediabetes - Primary    Check A1c.      Relevant Orders   HgB A1c   Stress     Offered support.  She declines medication at this time.  I did encourage her to try to find some time for herself.  She will let [place] if things worsen and she would like medication.  Return in about 4 months (around 11/28/2021).  This visit occurred during the SARS-CoV-2 public health emergency.  Safety protocols were in place, including screening questions prior to the visit, additional usage of staff PPE, and extensive cleaning of exam room while observing appropriate contact time as indicated for disinfecting solutions.    [name], MD [place]

## 2021-07-29 NOTE — Assessment & Plan Note (Signed)
Offered support.  She declines medication at this time.  I did encourage her to try to find some time for herself.  She will let us know if things worsen and she would like medication.

## 2021-07-31 ENCOUNTER — Other Ambulatory Visit: Payer: Self-pay | Admitting: Family Medicine

## 2021-07-31 DIAGNOSIS — I10 Essential (primary) hypertension: Secondary | ICD-10-CM

## 2021-08-03 ENCOUNTER — Other Ambulatory Visit: Payer: Self-pay | Admitting: Family Medicine

## 2021-08-03 DIAGNOSIS — M25551 Pain in right hip: Secondary | ICD-10-CM

## 2021-08-11 ENCOUNTER — Encounter: Payer: Self-pay | Admitting: *Deleted

## 2021-08-15 ENCOUNTER — Encounter: Payer: Self-pay | Admitting: Family Medicine

## 2021-08-31 ENCOUNTER — Inpatient Hospital Stay: Payer: Medicare Other

## 2021-09-01 ENCOUNTER — Other Ambulatory Visit: Payer: Self-pay | Admitting: Family Medicine

## 2021-09-01 DIAGNOSIS — M25551 Pain in right hip: Secondary | ICD-10-CM

## 2021-09-07 ENCOUNTER — Inpatient Hospital Stay: Payer: Medicare Other | Attending: Obstetrics and Gynecology | Admitting: Obstetrics and Gynecology

## 2021-09-07 VITALS — BP 113/71 | HR 81 | Temp 97.8°F | Resp 20 | Wt 235.8 lb

## 2021-09-07 DIAGNOSIS — C50411 Malignant neoplasm of upper-outer quadrant of right female breast: Secondary | ICD-10-CM | POA: Insufficient documentation

## 2021-09-07 DIAGNOSIS — C519 Malignant neoplasm of vulva, unspecified: Secondary | ICD-10-CM | POA: Diagnosis not present

## 2021-09-07 DIAGNOSIS — Z9079 Acquired absence of other genital organ(s): Secondary | ICD-10-CM | POA: Insufficient documentation

## 2021-09-07 DIAGNOSIS — C4499 Other specified malignant neoplasm of skin, unspecified: Secondary | ICD-10-CM

## 2021-09-07 DIAGNOSIS — Z8572 Personal history of non-Hodgkin lymphomas: Secondary | ICD-10-CM | POA: Insufficient documentation

## 2021-09-07 NOTE — Progress Notes (Signed)
Gynecologic Oncology Interval Visit   Referring Provider: Dr. Kenton Kingfisher  Chief Concern: Recurrent paget's disease of the vulva  Subjective:  Bailey Brooks is a 70 y.o. female, diagnosed with Paget's of the vulva, s/p WLE and vulvar biopsy 09/29/2015, who returns to clinic to re-evaluation.   She had recurrent pagets in 2021 and elected for imiquimod. She completed imiquimod treatment on 08/14/20. Biopsies post imiquimod were negative. We discussed maintenance imiquimod vs monitoring and she elected for surveillance. She returns today for follow up. She continues surveillance for history of breast cancer. Today, she is asymptomatic and denies itching and/or vulvar pain. She has now retired from [employer].    Gynecologic Oncology History: Bailey Brooks has a history of localized vulvar Paget's disease.   04/2013- vulvar biopsy revealed Paget's disease               WLE, additional margins resected for positive disease on frozen evaluation.   Part A: VULVA, VAGINAL MARGIN:  - POSITIVE FOR EXTRAMAMMARY PAGET'S DISEASE  Part B: VULVA, POSTERIOR MARGIN:  - POSITIVE FOR EXTRAMAMMARY PAGET'S DISEASE  Part C: VULVA, RIGHT, PARTIAL VULVECTOMY:  - EXTRAMAMMARY PAGET'S DISEASE  Part D: VULVA, NEW VAGINAL MARGIN:  - POSITIVE FOR EXTRAMAMMARY PAGET'S DISEASE  07/28/2015 for routine surveillance an area on the right vulva seemed more suspicious for recurrence. This was biopsied and confirmed recurrent Paget's disease.   She underwent repeat excision 09/29/15 A. VULVA; EXCISION:  - RARE CYTOKERATIN 7 POSITIVE INTRAEPIDERMAL CELLS, INTERPRETED AS RESIDUAL PAGET DISEASE.  - THE SURGICAL MARGINS ARE CLEAR.   B. VULVA, 11:00; BIOPSY:  - RARE CYTOKERATIN 7 POSITIVE INTRAEPIDERMAL CELLS, INTERPRETED AS PAGET DISEASE.   No additional issues since surgery.   She had vulvar biopsy on 05/09/17 which was negative:   DIAGNOSIS:  A. VULVA; BIOPSY:  - SKIN WITH FOCAL KERATOSIS AND HYPERGRANULOSIS - NEGATIVE  FOR DYSPLASIA AND MALIGNANCY  Of note, she has had a h/o recurrent vulvar candidiasis. Prior to her vulvar surgery 09/2015 she received 5 weekly doses of oral diflucan for vulvar candidiasis. Postop she was treated for yeast infection again in 12/17 and then took one Diflucan a week for six months with good results.   Seen in gyn-onc clinic on 01/16/18 by Dr. Theora Gianotti. NED at that time.   She has swelling of her lower extremities which was evaluated by her PCP and thought to be related to amlodipine. She started lasix which has improved her symptoms. She was seen by Dr. 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   She saw Dr. Theora Gianotti on 01/29/2019 with symptoms concerning for candidiasis at that time but no evidence of recurrent disease.   Her blood pressure medications were recently changes due to swelling secondary to amlodipine. No on lisinopril.   Previous biopsies were consistent with recurrent Paget's.   DIAGNOSIS:  A. VULVA, LEFT AT 3:00; PUNCH BIOPSY:  - BENIGN VULVAR TISSUE.  - NEGATIVE FOR DYSPLASIA AND MALIGNANCY.   DIAGNOSIS:  A. VULVA, RIGHT AT 7:00; PUNCH BIOPSY:  - EXTRAMAMMARY PAGET'S DISEASE OF THE VULVA.  - NEGATIVE FOR DYSPLASIA AND MALIGNANCY.   In the interim she has completed surgery and radiation for stage I right breast cancer, ER/PR positive HER-2/neu negative. She completed radiation on 12/29/2019. Anastrozole was recommended and she plans to start in approximately late February.   At last visit we had recommended start Aldara but due to recent diagnosis of breast cancer at that time,  patient requested to wait to start treatment and returns today to discuss options for management and consider starting Aldara. She is concerned about reaching site to be treated and tolerability. Her daughter-in-law is going to help her with application. Previously she had expressed desire to avoid surgery/Mohs.   01/16/20 started  Imiquimod  completed 5 weeks of treatment then held 02/18/2020 due to ulceration, erythema, and pain. Restarted on 03/17/2020. We recommended to continue imiquimod for 16 weeks and add clobetasol, 0.05% cream to vulva on alternating days that she does not use imiquimod to help with inflammation and reduce risk of recurrent ulcer/skin breakdown. Completed treatment on 08/14/2020.   She completed imiquimod treatment on 08/14/20. At last appointment, post imiquimod, she underwent biopsy x 2 which were negative for Paget's. We discussed maintenance imiquimod vs monitoring. She elected close surveillance.   DIAGNOSIS:  A. VULVA, RIGHT; BIOPSY:  - BENIGN SQUAMOUS MUCOSA WITH MILD CHRONIC INFLAMMATION.  - NEGATIVE FOR DYSPLASIA AND MALIGNANCY.  - NEGATIVE FOR ACTIVE INFLAMMATION AND FUNGAL ELEMENTS.   B. VULVA, LEFT; BIOPSY:  - BENIGN SQUAMOUS MUCOSA WITH MILD CHRONIC INFLAMMATION.  - NEGATIVE FOR DYSPLASIA AND MALIGNANCY.  - NEGATIVE FOR ACTIVE INFLAMMATION AND FUNGAL ELEMENTS.   Comment:  The history of extramammary Paget's disease is noted. Immunohistochemical stain for CK7 (blocks A1 and B1) is negative.   Patient had vasovagal episode secondary to straining to have BM in September 2021 and was seen in ER. Concerned lidocaine for biopsy could have contributed. She was seen in ER and had negative head ct. Left frontal ventriculoperitoneal shunt was stable on imaging.    Problem List: Patient Active Problem List   Diagnosis Date Noted   Nausea 07/29/2021   Head injury 09/03/2020   Vasovagal syncope 09/03/2020   Low serum vitamin B12 05/28/2020   Fatigue 03/24/2020   Genetic testing 03/23/2020   Family history of breast cancer    Family history of prostate cancer    Family history of melanoma    Carcinoma of upper-outer quadrant of right breast in female, estrogen receptor positive ([place]) 10/28/2019   Stress 08/06/2019   Breast cancer screening 08/06/2019   Pain due to onychomycosis of  toenails of both feet 06/09/2019   Prediabetes 12/20/2018   Macular degeneration 06/20/2018   GERD (gastroesophageal reflux disease) 06/20/2018   Hypersomnia 06/20/2018   Chronic pain of right knee 06/20/2018   Bruising 12/14/2017   History of lymphoma 06/11/2017   Vulvar lesion 05/16/2017   Low back pain 04/03/2017   History of transient ischemic attack (TIA) 02/10/2017   Hypokalemia 02/10/2017   Left hip pain 08/03/2016   Essential hypertension 07/06/2016   Left knee pain 06/28/2016   Hypomagnesemia 03/02/2016   Morbid obesity due to excess calories ([place]) 03/02/2016   Adjustment disorder with mixed anxiety and depressed mood 05/25/2015   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 11/2016 Echo: EF 60-65%, no rwma, mild LVH, Gr1  DD, triv MR, mildly dil LA, nl RV fxn.   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 Madelynn Done s/p RCHOP and methotrexate, c/b renal failure   Diverticulosis    Esophagitis    Family history of breast cancer    Family history of melanoma    Family history of prostate cancer    GERD (gastroesophageal reflux disease)    H/O stem cell transplant ([place]) 06/2011   a. in setting of lymphoma.   Hemorrhoids    History of chemotherapy 22-Oct-2010   RHCOP/methotrexate-intrathecal   History of stress test    a. 05/2010 Myoview: nl EF, no ischemia/infarct.   Hypercholesterolemia    Hypertension    Hypothyroidism    Morbid obesity ([place])    [place]  reservoir present    IN SCALP FROM 2012   Paget disease, extra mammary    vulva, s/p resection 2014, Dr. Sabra Heck   Paget's disease of vulva [facility])    Pagets disease, extra-mammary    Personal history of radiation therapy 2020-2021   right breast ca   TIA (transient ischemic attack)    X 2 -LAST ONE IN 2017    Past Surgical History: Past Surgical History:  Procedure Laterality Date   ABDOMINAL HYSTERECTOMY  1985   Hysterectomy-partial   BREAST BIOPSY Right 2002   Neg - AT [facility]   BREAST BIOPSY Right 2020   bx done at [facility]?, IDC and DCIS   BREAST LUMPECTOMY Right 10/16/2019   IDC and DCIS, negative LN   BURR HOLE W/ PLACEMENT OMMAYA RESERVOIR     BURR HOLE W/ PLACEMENT OMMAYA RESERVOIR  2012   COLONOSCOPY  04/2013   ESOPHAGOGASTRODUODENOSCOPY  04/2013   INJECTION KNEE  07/02/2018   INSERTION CENTRAL VENOUS ACCESS DEVICE W/ SUBCUTANEOUS PORT  2011   Port a Cath: Right chest Double Lumen, 04-Nov-2010   LIMBAL STEM CELL TRANSPLANT  2012   LIVER BIOPSY     stage 4B large Bcell lymphoma   PARTIAL MASTECTOMY WITH NEEDLE LOCALIZATION AND AXILLARY SENTINEL LYMPH NODE BX Right 10/16/2019   Procedure: PARTIAL MASTECTOMY WITH NEEDLE LOCALIZATION AND AXILLARY SENTINEL LYMPH NODE BX;  Surgeon: Benjamine Sprague, DO;  Location: [facility] ORS;  Service: General;  Laterality: Right;   ROTATOR CUFF REPAIR Right 2016   SHOULDER ARTHROSCOPY Right 06/22/2015   Procedure: ARTHROSCOPY SHOULDER, parital repair of rotator cuff, biceps tenodesis, decompression and debridement;  Surgeon: Corky Mull, MD;  Location: [facility] ORS;  Service: Orthopedics;  Laterality: Right;   [place] PARTIAL N/A 09/29/2015   Procedure: VULVECTOMY PARTIAL;  Surgeon: Gillis Ends, MD;  Location: [facility] ORS;  Service: Gynecology;  Laterality: N/A;    Past Gynecologic History:  S/p hysterectomy  OB History:  OB History  No obstetric history on file.    Family History: Family History   Problem Relation Age of Onset   Diabetes Mother        died @ 40 of MI.   Hypertension Mother    Heart attack Mother    Melanoma Father 39       died of complications r/t melanoma w/ lung mets.   Kidney disease Sister        Kidney removed    Breast cancer Sister 59   Prostate cancer Brother        Prostate - dx in 17's   Heart disease Brother 33  reported MI @ age 37, ? treated w/ TPA->no recurrent CAD, now in 52's.   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  No pets. 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   Right-handed   Caffeine: occasional caffeine free/diet soda or hot tea   Social Determinants of Health   Financial Resource Strain: Low Risk    Difficulty of Paying Living Expenses: Not hard at all  Food Insecurity: No Food Insecurity   Worried About Charity fundraiser in the Last Year: Never true   Ran Out of Food in the Last Year: Never true  Transportation Needs: No Transportation Needs   Lack of Transportation (Medical): No   Lack of Transportation (Non-Medical): No   Physical Activity: Unknown   Days of Exercise per Week: 0 days   Minutes of Exercise per Session: Not on file  Stress: No Stress Concern Present   Feeling of Stress : Only a little  Social Connections: Unknown   Frequency of Communication with Friends and Family: Not on file   Frequency of Social Gatherings with Friends and Family: Not on file   Attends Religious Services: Not on Electrical engineer or Organizations: Not on file   Attends Archivist Meetings: Not on file   Marital Status: Married  Human resources officer Violence: Not At Risk   Fear of Current or Ex-Partner: No   Emotionally Abused: No   Physically Abused: No   Sexually Abused: No    Allergies: Allergies  Allergen Reactions   Morphine And Related Nausea And Vomiting and Nausea Only    Hallucinations   Sulfa Antibiotics Other (See Comments)    Dizzy/Fainting   Iodine Swelling    IV iodine (states now that this can be tolerated with Benadryl)   Adhesive [Tape] Rash and Other (See Comments)    Including Bandaids   Erythromycin Nausea And Vomiting   Oxycodone Nausea And Vomiting    Current Medications: Current Outpatient Medications  Medication Sig Dispense Refill   anastrozole (ARIMIDEX) 1 MG tablet TAKE 1 TABLET(1 MG) BY MOUTH DAILY 90 tablet 0   aspirin EC 81 MG tablet Take 81 mg by mouth daily.     atorvastatin (LIPITOR) 80 MG tablet TAKE 1 TABLET(80 MG) BY MOUTH DAILY 90 tablet 3   chlorthalidone (HYGROTON) 25 MG tablet TAKE 1/2 TABLET(12.5  MG) BY MOUTH DAILY 45 tablet 1   cholecalciferol (VITAMIN D3) 25 MCG (1000 UT) tablet Take 1,000 Units by mouth daily.     ezetimibe (ZETIA) 10 MG tablet TAKE 1 TABLET(10 MG) BY MOUTH DAILY 90 tablet 3   ketoconazole (NIZORAL) 2 % cream Apply twice daily as directed. 60 g 2   lansoprazole (PREVACID) 30 MG capsule TAKE 1 CAPSULE(30 MG) BY MOUTH DAILY 90 capsule 1   levothyroxine (SYNTHROID) 88 MCG tablet TAKE 1 TABLET BY MOUTH EVERY MORNING ON AN EMPTY  STOMACH 90 tablet 3   lisinopril (ZESTRIL) 20 MG tablet TAKE 1 TABLET(20 MG) BY MOUTH DAILY 90 tablet 1   mometasone (ELOCON) 0.1 % cream Apply 1 application topically daily as needed (eczema).  0   Multiple Vitamin (MULTIVITAMIN) tablet Take 1 tablet by mouth daily.     naproxen (NAPROSYN) 500 MG tablet TAKE 1 TABLET(500 MG) BY MOUTH TWICE DAILY WITH FOOD AS NEEDED FOR MODERATE PAIN 30 tablet 0   Specialty Vitamins Products (ICAPS LUTEIN & ZEAXANTHIN PO) Take by mouth every morning. Zeaxanthin 77m/Lutein 142m    tacrolimus (PROTOPIC) 0.1 % ointment Apply 1-2 times daily as needed for itch to affected areas. 60 g 2   No current facility-administered medications for this visit.   Review of Systems General:  no complaints Skin: no complaints Eyes: no complaints HEENT: no complaints Breasts: no complaints Pulmonary: no complaints Cardiac: no complaints Gastrointestinal: no complaints Genitourinary/Sexual: no complaints Ob/Gyn: no complaints Musculoskeletal: no complaints Hematology: no complaints Neurologic/Psych: no complaints  Objective:  Physical Examination:  BP 113/71   Pulse 81   Temp 97.8 F (36.6 C)   Resp 20   Wt 235 lb 12.8 oz (107 kg)   SpO2 100%   BMI 41.77 kg/m    Body mass index is 41.77 kg/m.   ECOG Performance Status: 0 - Asymptomatic   GENERAL: Patient is a well appearing female in no acute distress LUNGS:  Clear to auscultation bilaterally.   HEART:  Regular rate and rhythm.  ABDOMEN:  Soft, nontender.  EXTREMITIES:  No peripheral edema.  SKIN:  Clear with no obvious rashes or skin changes.  NEURO:  Nonfocal. Well oriented.  Appropriate affect.  Pelvic: Chaperoned by nursing. EGBUS: multiple vulvar scars, well healed from previous resections. More pronounced red erythema from 9 - 3 o'clock of the introitus and below the urethra. Very sharp demarcation between mucosa/vulvar tissue and epithelial tissue on right, unchanged. No ulcerations. Erythema of  left labia major stable compared to previous exams. Vagina: no lesions, discharge, or bleeding. Cervix: absent. BME: negative for masses or nodularity  Assessment:  Bailey Brooks a [age].o. female diagnosed with recurrent Paget's disease of the vulva status post wide local excision with positive margins and subsequent recurrence in 07/2015.  Repeat wide local excision consistent with recurrent Paget's disease of the vulva status post wide local excision with negative margins.  Biopsy in 2018 was negative.  Biopsy in 2020 at 7:00 consistent with recurrent Paget's.  She deffered prior treatment due to breast cancer diagnosis and need for therapy. She has now completed treatment and she is ready to focus on management of Paget's disease.  01/16/20 started Imiquimod  completed 5 weeks of treatment then held 02/18/2020 due to ulceration, erythema, and pain. Restarted on 03/17/2020 and completed  08/14/2020. Negative biopsies 9/21. Opted for surveillance. More pronounced area concerning for Pagets superior introitus, asymptomatic. She declines biopsy.   History of recurrent vulvar candidiasis.  Right breast cancer-newly diagnosed with right breast cancer status post biopsy.  Plans for right lumpectomy on 10/16/2019 with Dr. Lysle Pearl.  She is followed by medical oncology/Dr. Rogue Bussing for possible adjuvant treatment.      Plan:   Problem List Items Addressed This Visit   None Visit Diagnoses     Extramammary Paget disease    -  Primary       Plan for continued close surveillance. She has expressed her desire to avoid invasive procedures and/or biopsies if possible.   If intolerant to treatment or refractory symptoms could consider vulvectomy with plastic surgery for skin graft versus Mohs surgery versus continued close surveillance versus imiquimod for maintenance therapy to prevent progression.  Patient does request female providers only.  Return to clinic in 6 months for re-evaluation.    Verlon Au, NP  I personally had a face to face interaction and evaluated the patient jointly with the NP, Ms. Beckey Rutter.  I have reviewed her history and available records and have performed the key portions of the physical exam including abdominal exam, pelvic exam with my findings confirming those documented above by the APP.  I have discussed the case with the APP and the patient.  I agree with the above documentation, assessment and plan which was fully formulated by me.  Counseling was completed by me.   I personally saw the patient and performed a substantive portion of this encounter in conjunction with the listed APP as documented above.  Albirta Rhinehart Gaetana Michaelis, MD

## 2021-09-15 ENCOUNTER — Other Ambulatory Visit: Payer: Self-pay | Admitting: Family Medicine

## 2021-09-26 ENCOUNTER — Telehealth: Payer: Self-pay | Admitting: Family Medicine

## 2021-09-26 DIAGNOSIS — Z853 Personal history of malignant neoplasm of breast: Secondary | ICD-10-CM

## 2021-09-26 NOTE — Telephone Encounter (Signed)
Patient would like a referral to have her mammogram done at [facility].  Ares Cardozo,cma

## 2021-09-26 NOTE — Telephone Encounter (Signed)
Patient would like a referral to have her mammogram done at [facility].

## 2021-09-27 NOTE — Addendum Note (Signed)
Addended by: Leone Haven on: 09/27/2021 09:23 AM   Modules accepted: Orders

## 2021-09-27 NOTE — Telephone Encounter (Signed)
Order placed.  If she does not hear about this being scheduled in the next couple of weeks she should let us know.

## 2021-09-28 ENCOUNTER — Telehealth: Payer: Self-pay | Admitting: Family Medicine

## 2021-09-28 NOTE — Telephone Encounter (Signed)
Called to advise patient that as the RN Clinical Supervisor I had called IT and placed a ticket to have two abstracted results removed from her chart  data abstraction date was 09/08/21 at 4:50 PM, one for Dexa scan and another for DM foot exam to be removed. Patient stated she just would like the information removed.

## 2021-09-28 NOTE — Progress Notes (Signed)
Order(s) created erroneously. Erroneous order ID: [id number]  Order moved by: Wilfred Curtis  Order move date/time: 09/28/2021 12:56 PM  Source Patient: [id number]  Source Contact: 09/08/2021  Destination Patient: [id number]  Destination Contact: 12/21/2020  Erroneous order ID: [id number]  Order moved by: Wilfred Curtis  Order move date/time: 09/28/2021 12:56 PM  Source Patient: [id number]  Source Contact: 09/08/2021  Destination Patient: [id number]  Destination Contact: 12/21/2020

## 2021-10-02 ENCOUNTER — Other Ambulatory Visit: Payer: Self-pay | Admitting: Internal Medicine

## 2021-10-03 ENCOUNTER — Ambulatory Visit: Payer: Medicare Other | Admitting: Dermatology

## 2021-10-03 ENCOUNTER — Other Ambulatory Visit: Payer: Self-pay | Admitting: Family Medicine

## 2021-10-03 ENCOUNTER — Other Ambulatory Visit: Payer: Self-pay

## 2021-10-03 DIAGNOSIS — L814 Other melanin hyperpigmentation: Secondary | ICD-10-CM | POA: Diagnosis not present

## 2021-10-03 DIAGNOSIS — D1800 Hemangioma unspecified site: Secondary | ICD-10-CM

## 2021-10-03 DIAGNOSIS — L820 Inflamed seborrheic keratosis: Secondary | ICD-10-CM

## 2021-10-03 DIAGNOSIS — L300 Nummular dermatitis: Secondary | ICD-10-CM | POA: Diagnosis not present

## 2021-10-03 DIAGNOSIS — D2239 Melanocytic nevi of other parts of face: Secondary | ICD-10-CM

## 2021-10-03 DIAGNOSIS — L821 Other seborrheic keratosis: Secondary | ICD-10-CM | POA: Diagnosis not present

## 2021-10-03 DIAGNOSIS — D692 Other nonthrombocytopenic purpura: Secondary | ICD-10-CM | POA: Diagnosis not present

## 2021-10-03 DIAGNOSIS — D225 Melanocytic nevi of trunk: Secondary | ICD-10-CM | POA: Diagnosis not present

## 2021-10-03 DIAGNOSIS — I781 Nevus, non-neoplastic: Secondary | ICD-10-CM

## 2021-10-03 DIAGNOSIS — L304 Erythema intertrigo: Secondary | ICD-10-CM

## 2021-10-03 DIAGNOSIS — M25551 Pain in right hip: Secondary | ICD-10-CM

## 2021-10-03 DIAGNOSIS — L578 Other skin changes due to chronic exposure to nonionizing radiation: Secondary | ICD-10-CM

## 2021-10-03 DIAGNOSIS — Z1283 Encounter for screening for malignant neoplasm of skin: Secondary | ICD-10-CM

## 2021-10-03 DIAGNOSIS — D229 Melanocytic nevi, unspecified: Secondary | ICD-10-CM

## 2021-10-03 MED ORDER — MOMETASONE FUROATE 0.1 % EX CREA: TOPICAL_CREAM | CUTANEOUS | 3 refills | Status: DC

## 2021-10-03 NOTE — Patient Instructions (Addendum)
Mometasone cream - Apply twice a day to itchy patches on body.   Clobetasol Cream - (stronger steroid) Apply to more severe itchy patches on body twice a day until improved. Avoid face, groin, underarms, body folds.   Topical steroids (such as triamcinolone, fluocinolone, fluocinonide, mometasone, clobetasol, halobetasol, betamethasone, hydrocortisone) can cause thinning and lightening of the skin if they are used for too long in the same area. Your physician has selected the right strength medicine for your problem and area affected on the body. Please use your medication only as directed by your physician to prevent side effects.   Ketoconazole 2% Cream - Apply twice a day to pink patches in body folds (yeast) as needed until clear.  Tacrolimus ointment - Apply twice daily to itchy rash in body folds until improved.    Cryotherapy Aftercare  Wash gently with soap and water everyday.   Apply Vaseline and Band-Aid daily until healed.    Dry Skin Care  What causes dry skin?  Dry skin is common and results from inadequate moisture in the outer skin layers. Dry skin usually results from the excessive loss of moisture from the skin surface. This occurs due to two major factors: Normally the skin's oil glands deposit a layer of oil on the skin's surface. This layer of oil prevents the loss of moisture from the skin. Exposure to soaps, cleaners, solvents, and disinfectants removes this oily film, allowing water to escape. Water loss from the skin increases when the humidity is low. During winter months we spend a lot of time indoors where the air is heated. Heated air has very low humidity. This also contributes to dry skin.  A tendency for dry skin may accompany such disorders as eczema. Also, as people age, the number of functioning oil glands decreases, and the tendency toward dry skin can be a sensation of skin tightness when emerging from the shower.  How do I manage dry skin?  Humidify  your environment. This can be accomplished by using a humidifier in your bedroom at night during winter months. Bathing can actually put moisture back into your skin if done right. Take the following steps while bathing to sooth dry skin: Avoid hot water, which only dries the skin and makes itching worse. Use warm water. Avoid washcloths or extensive rubbing or scrubbing. Use mild soaps like unscented Dove, Oil of Olay, Cetaphil, Basis, or CeraVe. If you take baths rather than showers, rinse off soap residue with clean water before getting out of tub. Once out of the shower/tub, pat dry gently with a soft towel. Leave your skin damp. While still damp, apply any medicated ointment/cream you were prescribed to the affected areas. After you apply your medicated ointment/cream, then apply your moisturizer to your whole body.This is the most important step in dry skin care. If this is omitted, your skin will continue to be dry. The choice of moisturizer is also very important. In general, lotion will not provider enough moisture to severely dry skin because it is water based. You should use an ointment or cream. Moisturizers should also be unscented. Good choices include Vaseline (plain petrolatum), Aquaphor, Cetaphil, CeraVe, Vanicream, DML Forte, Aveeno moisture, or Eucerin Cream. Bath oils can be helpful, but do not replace the application of moisturizer after the bath. In addition, they make the tub slippery causing an increased risk for falls. Therefore, we do not recommend their use.  Seborrheic Keratosis   What causes seborrheic keratoses? Seborrheic keratoses are harmless, common skin  growths that first appear during adult life.  As time goes by, more growths appear.  Some people may develop a large number of them.  Seborrheic keratoses appear on both covered and uncovered body parts.  They are not caused by sunlight.  The tendency to develop seborrheic keratoses can be inherited.  They vary in  color from skin-colored to gray, brown, or even black.  They can be either smooth or have a rough, warty surface.   Seborrheic keratoses are superficial and look as if they were stuck on the skin.  Under the microscope this type of keratosis looks like layers upon layers of skin.  That is why at times the top layer may seem to fall off, but the rest of the growth remains and re-grows.    Treatment Seborrheic keratoses do not need to be treated, but can easily be removed in the office.  Seborrheic keratoses often cause symptoms when they rub on clothing or jewelry.  Lesions can be in the way of shaving.  If they become inflamed, they can cause itching, soreness, or burning.  Removal of a seborrheic keratosis can be accomplished by freezing, burning, or surgery. If any spot bleeds, scabs, or grows rapidly, please return to have it checked, as these can be an indication of a skin cancer.  If you have any questions or concerns for your doctor, please call our main line at [phone] and press option 4 to reach your doctor's medical assistant. If no one answers, please leave a voicemail as directed and we will return your call as soon as possible. Messages left after 4 pm will be answered the following business day.   You may also send [place] a message via [place]. We typically respond to MyChart messages within 1-2 business days.  For prescription refills, please ask your pharmacy to contact our office. Our fax number is [phone].  If you have an urgent issue when the clinic is closed that cannot wait until the next business day, you can page your doctor at the number below.    Please note that while we do our best to be available for urgent issues outside of office hours, we are not available 24/7.   If you have an urgent issue and are unable to reach [place], you may choose to seek medical care at your doctor's office, retail clinic, urgent care center, or emergency room.  If you have a medical  emergency, please immediately call 911 or go to the emergency department.  Pager Numbers  - Dr. Nehemiah Massed: [phone]  - Dr. Laurence Ferrari: [phone]  - Dr. Nicole Kindred: [phone]  In the event of inclement weather, please call our main line at [phone] for an update on the status of any delays or closures.  Dermatology Medication Tips: Please keep the boxes that topical medications come in in order to help keep track of the instructions about where and how to use these. Pharmacies typically print the medication instructions only on the boxes and not directly on the medication tubes.   If your medication is too expensive, please contact our office at [phone] option 4 or send [place] a message through [place].   We are unable to tell what your co-pay for medications will be in advance as this is different depending on your insurance coverage. However, we may be able to find a substitute medication at lower cost or fill out paperwork to get insurance to cover a needed medication.   If a prior authorization is required to  get your medication covered by your insurance company, please allow [place] 1-2 business days to complete this process.  Drug prices often vary depending on where the prescription is filled and some pharmacies may offer cheaper prices.  The website www.goodrx.com contains coupons for medications through different pharmacies. The prices here do not account for what the cost may be with help from insurance (it may be cheaper with your insurance), but the website can give you the price if you did not use any insurance.  - You can print the associated coupon and take it with your prescription to the pharmacy.  - You may also stop by our office during regular business hours and pick up a GoodRx coupon card.  - If you need your prescription sent electronically to a different pharmacy, notify our office through [facility] or by phone at [phone] option 4.

## 2021-10-03 NOTE — Progress Notes (Signed)
Follow-Up Visit   Subjective  Bailey Brooks is a 70 y.o. female who presents for the following: Annual Exam.  Patient here for TBSE. She has an irritated spot on her right eyebrow and back of knee. She also has an itchy area on her right back. No history of skin cancer.   The following portions of the chart were reviewed this encounter and updated as appropriate:       Review of Systems:  No other skin or systemic complaints except as noted in HPI or Assessment and Plan.  Objective  Well appearing patient in no apparent distress; mood and affect are within normal limits.  A full examination was performed including scalp, head, eyes, ears, nose, lips, neck, chest, axillae, abdomen, back, buttocks, bilateral upper extremities, bilateral lower extremities, hands, feet, fingers, toes, fingernails, and toenails. All findings within normal limits unless otherwise noted below.  Left Upper Back 5.0 x 4.0 mm brown macule  Right Upper Back, Left knee Pink excoriations, mild xerosis. Pink scaly patch  Left Popliteal x 1, Right medial eyebrow x 1, R temple x 1 (3) Waxy pink scaly patches (face) and papule (knee).   Right Upper Inner Arm Blanching pink macule.  bilateral inguinal crease Mild erythema in crease BL.  L nasal tip 2.0 mm firm flesh papule   Assessment & Plan  Skin cancer screening performed today.  Actinic Damage - chronic, secondary to cumulative UV radiation exposure/sun exposure over time - diffuse scaly erythematous macules with underlying dyspigmentation - Recommend daily broad spectrum sunscreen SPF 30+ to sun-exposed areas, reapply every 2 hours as needed.  - Recommend staying in the shade or wearing long sleeves, sun glasses (UVA+UVB protection) and wide brim hats (4-inch brim around the entire circumference of the hat). - Call for new or changing lesions.  Lentigines - Scattered tan macules - Due to sun exposure - Benign-appering, observe -  Recommend daily broad spectrum sunscreen SPF 30+ to sun-exposed areas, reapply every 2 hours as needed. - Call for any changes  Seborrheic Keratoses - Stuck-on, waxy, tan-brown papules and/or plaques, including left mid back  - Benign-appearing - Discussed benign etiology and prognosis. - Observe - Call for any changes  Hemangiomas - Red papules - Discussed benign nature - Observe - Call for any changes  Purpura - Chronic; persistent and recurrent.  Treatable, but not curable. - Violaceous macules and patches - Benign - Related to trauma, age, sun damage and/or use of blood thinners, chronic use of topical and/or oral steroids - Observe - Can use OTC arnica containing moisturizer such as Dermend Bruise Formula if desired - Call for worsening or other concerns   Nevus Left Upper Back  Benign-appearing.  Stable. Observation.  Call clinic for new or changing moles.  Recommend daily use of broad spectrum spf 30+ sunscreen to sun-exposed areas.   Nummular dermatitis Right Upper Back, Left knee  Recommend mild soap and moisturizing cream 1-2 times daily.  Gentle skin care handout provided.   Restart mometasone cream Apply BID to AA body until clear. Pt has.  Restart clobetasol cream Apply BID to more severe areas body until clear.  Caution atrophy with long-term use.   Topical steroids (such as triamcinolone, fluocinolone, fluocinonide, mometasone, clobetasol, halobetasol, betamethasone, hydrocortisone) can cause thinning and lightening of the skin if they are used for too long in the same area. Your physician has selected the right strength medicine for your problem and area affected on the body. Please use your medication  only as directed by your physician to prevent side effects.    Inflamed seborrheic keratosis Left Popliteal x 1, Right medial eyebrow x 1, R temple x 1  Destruction of lesion - Left Popliteal x 1, Right medial eyebrow x 1, R temple x 1  Destruction method:  cryotherapy   Informed consent: discussed and consent obtained   Lesion destroyed using liquid nitrogen: Yes   Region frozen until ice ball extended beyond lesion: Yes   Outcome: patient tolerated procedure well with no complications   Post-procedure details: wound care instructions given   Additional details:  Prior to procedure, discussed risks of blister formation, small wound, skin dyspigmentation, or rare scar following cryotherapy. Recommend Vaseline ointment to treated areas while healing.   Telangiectasias Right Upper Inner Arm  Benign, observe.    Intertrigo bilateral inguinal crease  Improved  Intertrigo is a chronic recurrent rash that occurs in skin fold areas that may be associated with friction; heat; moisture; yeast; fungus; and bacteria.  It is exacerbated by increased movement / activity; sweating; and higher atmospheric temperature.  Continue Aquaphor daily as skin protectant Ketoconazole 2% cream Apply BID prn pink bumps/patches. Pt has. Tacrolimus 0.1% ointment Apply BID prn itchy rash. Pt has.  Fibrous papule of nose L nasal tip  Benign-appearing, observe.    Return in about 1 year (around 10/03/2022) for TBSE.  IJamesetta Orleans, CMA, am acting as scribe for Brendolyn Patty, MD .  Documentation: I have reviewed the above documentation for accuracy and completeness, and I agree with the above.  Brendolyn Patty MD

## 2021-10-05 ENCOUNTER — Inpatient Hospital Stay (HOSPITAL_BASED_OUTPATIENT_CLINIC_OR_DEPARTMENT_OTHER): Payer: Medicare Other | Admitting: Internal Medicine

## 2021-10-05 ENCOUNTER — Other Ambulatory Visit: Payer: Self-pay

## 2021-10-05 ENCOUNTER — Inpatient Hospital Stay: Payer: Medicare Other | Attending: Internal Medicine

## 2021-10-05 ENCOUNTER — Encounter: Payer: Self-pay | Admitting: Internal Medicine

## 2021-10-05 VITALS — BP 176/85 | HR 70 | Temp 98.7°F | Resp 18 | Wt 240.2 lb

## 2021-10-05 DIAGNOSIS — Z803 Family history of malignant neoplasm of breast: Secondary | ICD-10-CM | POA: Insufficient documentation

## 2021-10-05 DIAGNOSIS — Z8051 Family history of malignant neoplasm of kidney: Secondary | ICD-10-CM | POA: Insufficient documentation

## 2021-10-05 DIAGNOSIS — Z808 Family history of malignant neoplasm of other organs or systems: Secondary | ICD-10-CM | POA: Diagnosis not present

## 2021-10-05 DIAGNOSIS — I110 Hypertensive heart disease with heart failure: Secondary | ICD-10-CM | POA: Insufficient documentation

## 2021-10-05 DIAGNOSIS — I509 Heart failure, unspecified: Secondary | ICD-10-CM | POA: Insufficient documentation

## 2021-10-05 DIAGNOSIS — Z853 Personal history of malignant neoplasm of breast: Secondary | ICD-10-CM

## 2021-10-05 DIAGNOSIS — R5383 Other fatigue: Secondary | ICD-10-CM | POA: Insufficient documentation

## 2021-10-05 DIAGNOSIS — C50411 Malignant neoplasm of upper-outer quadrant of right female breast: Secondary | ICD-10-CM

## 2021-10-05 DIAGNOSIS — Z79811 Long term (current) use of aromatase inhibitors: Secondary | ICD-10-CM

## 2021-10-05 DIAGNOSIS — Z8042 Family history of malignant neoplasm of prostate: Secondary | ICD-10-CM | POA: Insufficient documentation

## 2021-10-05 DIAGNOSIS — Z170 Estrogen receptor positive status [ER+]: Secondary | ICD-10-CM | POA: Diagnosis not present

## 2021-10-05 DIAGNOSIS — Z90710 Acquired absence of both cervix and uterus: Secondary | ICD-10-CM | POA: Diagnosis not present

## 2021-10-05 LAB — COMPREHENSIVE METABOLIC PANEL
ALT: 24 U/L (ref 0–44)
AST: 28 U/L (ref 15–41)
Albumin: 3.8 g/dL (ref 3.5–5.0)
Alkaline Phosphatase: 86 U/L (ref 38–126)
Anion gap: 8 (ref 5–15)
BUN: 21 mg/dL (ref 8–23)
CO2: 28 mmol/L (ref 22–32)
Calcium: 9.2 mg/dL (ref 8.9–10.3)
Chloride: 101 mmol/L (ref 98–111)
Creatinine, Ser: 0.91 mg/dL (ref 0.44–1.00)
GFR, Estimated: >60 mL/min (ref >60)
Glucose, Bld: 117 mg/dL — ABNORMAL HIGH (ref 70–99)
Potassium: 3.8 mmol/L (ref 3.5–5.1)
Sodium: 137 mmol/L (ref 135–145)
Total Bilirubin: 0.4 mg/dL (ref 0.3–1.2)
Total Protein: 7 g/dL (ref 6.5–8.1)

## 2021-10-05 LAB — CBC WITH DIFFERENTIAL/PLATELET
Abs Immature Granulocytes: 0.03 10*3/uL (ref 0.00–0.07)
Basophils Absolute: 0 10*3/uL (ref 0.0–0.1)
Basophils Relative: 0 %
Eosinophils Absolute: 0.2 10*3/uL (ref 0.0–0.5)
Eosinophils Relative: 3 %
HCT: 39.1 % (ref 36.0–46.0)
Hemoglobin: 12.9 g/dL (ref 12.0–15.0)
Immature Granulocytes: 1 %
Lymphocytes Relative: 33 %
Lymphs Abs: 1.7 10*3/uL (ref 0.7–4.0)
MCH: 28.7 pg (ref 26.0–34.0)
MCHC: 33 g/dL (ref 30.0–36.0)
MCV: 86.9 fL (ref 80.0–100.0)
Monocytes Absolute: 0.4 10*3/uL (ref 0.1–1.0)
Monocytes Relative: 7 %
Neutro Abs: 2.9 10*3/uL (ref 1.7–7.7)
Neutrophils Relative %: 56 %
Platelets: 185 10*3/uL (ref 150–400)
RBC: 4.5 MIL/uL (ref 3.87–5.11)
RDW: 14.5 % (ref 11.5–15.5)
WBC: 5.2 10*3/uL (ref 4.0–10.5)
nRBC: 0 % (ref 0.0–0.2)

## 2021-10-05 NOTE — Progress Notes (Signed)
[facility] OFFICE PROGRESS NOTE  Patient Care Team: Bailey Haven, MD as PCP - General (Family Medicine) Clent Jacks, RN as Registered Nurse  Cancer Staging No matching staging information was found for the patient.   Oncology History Overview Note  # RIGHT BREAST CANCER stage I [pTapN0; G-1; ER-100%; PR-99%; Her-2-NEG; Dr.Sakai]. 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   DIAGNOSIS: Right breast  STAGE:   I      ;  GOALS: cure  CURRENT/MOST RECENT THERAPY : anastrazole   History of lymphoma  Carcinoma of upper-outer quadrant of right breast in female, estrogen receptor positive ([place])  10/28/2019 Initial Diagnosis   Carcinoma of upper-outer quadrant of right breast in female, estrogen receptor positive ([place])   03/20/2020 Genetic Testing   Negative genetic testing on the multi-cancer panel.  The Multi-Gene Panel offered by [facility] includes sequencing and/or deletion duplication testing of the following 85 genes: AIP, ALK, APC, ATM, AXIN2,BAP1,  BARD1, BLM, BMPR1A, BRCA1, BRCA2, BRIP1, CASR, CDC73, CDH1, CDK4, CDKN1B, CDKN1C, CDKN2A (p14ARF), CDKN2A (p16INK4a), CEBPA, CHEK2, CTNNA1, DICER1, DIS3L2, EGFR (c.2369C>T, p.Thr790Met variant only), EPCAM (Deletion/duplication testing only), FH, FLCN, GATA2, GPC3, GREM1 (Promoter region deletion/duplication testing only), HOXB13 (c.251G>A, p.Gly84Glu), HRAS, KIT, MAX, MEN1, MET, MITF (c.952G>A, p.Glu318Lys variant only), MLH1, MSH2, MSH3, MSH6, MUTYH, NBN, NF1, NF2, NTHL1, PALB2, PDGFRA, PHOX2B, PMS2, POLD1, POLE, POT1, PRKAR1A, PTCH1,  PTEN, RAD50, RAD51C, RAD51D, RB1, RECQL4, RET, RNF43, RUNX1, SDHAF2, SDHA (sequence changes only), SDHB, SDHC, SDHD, SMAD4, SMARCA4, SMARCB1, SMARCE1, STK11, SUFU, TERC, TERT, TMEM127, TP53, TSC1, TSC2, VHL, WRN and WT1.  The report date is March 20, 2020.       INTERVAL HISTORY:  Bailey Brooks 70 y.o.  female pleasant patient stage I ER/PR positive HER-2 negative breast cancer on AI is here for follow-up.  Complains of mild joint pains.  Not any worse.  No hot flashes.  No worsening bone pain.She continues with anastrozole.   Denies any swelling in the legs.   Review of Systems  Constitutional:  Positive for malaise/fatigue. Negative for chills, diaphoresis and fever.  HENT:  Negative for nosebleeds and sore throat.   Eyes:  Negative for double vision.  Respiratory:  Negative for cough, hemoptysis, sputum production, shortness of breath and wheezing.   Cardiovascular:  Negative for chest pain, palpitations, orthopnea and leg swelling.  Gastrointestinal:  Negative for abdominal pain, blood in stool, constipation, diarrhea, heartburn, melena, nausea and vomiting.  Genitourinary:  Negative for dysuria, frequency and urgency.  Musculoskeletal:  Positive for back pain and joint pain.  Skin: Negative.  Negative for itching and rash.  Neurological:  Negative for dizziness, tingling, focal weakness, weakness and headaches.  Endo/Heme/Allergies:  Does not bruise/bleed easily.  Psychiatric/Behavioral:  Negative for depression. The patient is not nervous/anxious and does not have insomnia.      PAST MEDICAL HISTORY :  Past Medical History:  Diagnosis Date   Adult pulmonary Langerhans cell histiocytosis (HCC)    Eosinophilic Granuloma of the Lung)   Allergic rhinitis    Anemia    WHILE GOING THRU CHEMO   Arthritis    Breast cancer ([place]) 10/2019   right breast ca   CHF (congestive  heart failure) ([place])    PT DENIES   Diastolic dysfunction    a. 11/2016 Echo: EF 60-65%, no rwma, mild  LVH, Gr1 DD, triv MR, mildly dil LA, nl RV fxn.   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 Madelynn Done s/p RCHOP and methotrexate, c/b renal failure   Diverticulosis    Esophagitis    Family history of breast cancer    Family history of melanoma    Family history of prostate cancer    GERD (gastroesophageal reflux disease)    H/O stem cell transplant ([place]) 06/2011   a. in setting of lymphoma.   Hemorrhoids    History of chemotherapy 22-Oct-2010   RHCOP/methotrexate-intrathecal   History of stress test    a. 05/2010 Myoview: nl EF, no ischemia/infarct.   Hypercholesterolemia    Hypertension    Hypothyroidism    Morbid obesity ([place])    Ommaya reservoir present    IN SCALP FROM 2012   Paget disease, extra mammary    vulva, s/p resection 2014, Dr. Sabra Heck   Paget's disease of vulva [facility])    Pagets disease, extra-mammary    Personal history of radiation therapy 2020-2021   right breast ca   TIA (transient ischemic attack)    X 2 -LAST ONE IN 2017    PAST SURGICAL HISTORY :   Past Surgical History:  Procedure Laterality Date   ABDOMINAL HYSTERECTOMY  1985   Hysterectomy-partial   BREAST BIOPSY Right 2002   Neg - AT [facility]   BREAST BIOPSY Right 2020   bx done at [facility]?, IDC and DCIS   BREAST LUMPECTOMY Right 10/16/2019   IDC and DCIS, negative LN   BURR HOLE W/ PLACEMENT OMMAYA RESERVOIR     BURR HOLE W/ PLACEMENT OMMAYA RESERVOIR  2012   COLONOSCOPY  04/2013   ESOPHAGOGASTRODUODENOSCOPY  04/2013   INJECTION KNEE  07/02/2018   INSERTION CENTRAL VENOUS ACCESS DEVICE W/ SUBCUTANEOUS PORT  2011   Port a Cath: Right chest Double Lumen, 04-Nov-2010   LIMBAL STEM CELL TRANSPLANT  2012   LIVER BIOPSY     stage 4B large Bcell lymphoma   PARTIAL MASTECTOMY WITH NEEDLE LOCALIZATION AND AXILLARY SENTINEL LYMPH NODE BX Right 10/16/2019   Procedure: PARTIAL MASTECTOMY WITH NEEDLE LOCALIZATION AND AXILLARY SENTINEL LYMPH NODE BX;  Surgeon: Benjamine Sprague, DO;  Location:  [facility] ORS;  Service: General;  Laterality: Right;   ROTATOR CUFF REPAIR Right 2016   SHOULDER ARTHROSCOPY Right 06/22/2015   Procedure: ARTHROSCOPY SHOULDER, parital repair of rotator cuff, biceps tenodesis, decompression and debridement;  Surgeon: Corky Mull, MD;  Location: [facility] ORS;  Service: Orthopedics;  Laterality: Right;   [place] PARTIAL N/A 09/29/2015   Procedure: VULVECTOMY PARTIAL;  Surgeon: Gillis Ends, MD;  Location: [facility] ORS;  Service: Gynecology;  Laterality: N/A;    FAMILY HISTORY :   Family History  Problem Relation Age of Onset   Diabetes Mother        died @ 69 of MI.   Hypertension Mother    Heart attack Mother    Melanoma Father 60       died of complications r/t melanoma w/ lung mets.   Kidney disease Sister        Kidney removed    Breast cancer Sister 66   Prostate cancer Brother        Prostate - dx in 17's  Heart disease Brother 68       reported MI @ age 109, ? treated w/ TPA->no recurrent CAD, now in 24's.   Hearing loss Maternal Aunt    Cancer Maternal Aunt        pancreatic vs colon cancer   Hearing loss Maternal Grandfather    Other Maternal Grandmother        flu pandemic   Kidney cancer Niece 15       partial nephrectomy    SOCIAL HISTORY:   Social History   Tobacco Use   Smoking status: Never   Smokeless tobacco: Never  Vaping Use   Vaping Use: Never used  Substance Use Topics   Alcohol use: No    Alcohol/week: 0.0 standard drinks   Drug use: No    ALLERGIES:  is allergic to morphine and related, sulfa antibiotics, iodine, adhesive [tape], erythromycin, and oxycodone.  MEDICATIONS:  Current Outpatient Medications  Medication Sig Dispense Refill   anastrozole (ARIMIDEX) 1 MG tablet TAKE 1 TABLET(1 MG) BY MOUTH DAILY 90 tablet 0   aspirin EC 81 MG tablet Take 81 mg by mouth daily.     atorvastatin (LIPITOR) 80 MG tablet TAKE 1 TABLET(80 MG) BY MOUTH DAILY 90 tablet 3    chlorthalidone (HYGROTON) 25 MG tablet TAKE 1/2 TABLET(12.5 MG) BY MOUTH DAILY 45 tablet 1   cholecalciferol (VITAMIN D3) 25 MCG (1000 UT) tablet Take 1,000 Units by mouth daily.     ezetimibe (ZETIA) 10 MG tablet TAKE 1 TABLET(10 MG) BY MOUTH DAILY 90 tablet 3   ketoconazole (NIZORAL) 2 % cream Apply twice daily as directed. 60 g 2   lansoprazole (PREVACID) 30 MG capsule TAKE 1 CAPSULE(30 MG) BY MOUTH DAILY 90 capsule 1   levothyroxine (SYNTHROID) 88 MCG tablet TAKE 1 TABLET BY MOUTH EVERY MORNING ON AN EMPTY STOMACH 90 tablet 3   lisinopril (ZESTRIL) 20 MG tablet TAKE 1 TABLET(20 MG) BY MOUTH DAILY 90 tablet 1   mometasone (ELOCON) 0.1 % cream Apply to itchy spots on body 1-2 times until improved. Avoid face, groin, underarms. 50 g 3   Multiple Vitamin (MULTIVITAMIN) tablet Take 1 tablet by mouth daily.     naproxen (NAPROSYN) 500 MG tablet TAKE 1 TABLET(500 MG) BY MOUTH TWICE DAILY WITH FOOD AS NEEDED FOR MODERATE PAIN 30 tablet 0   Specialty Vitamins Products (ICAPS LUTEIN & ZEAXANTHIN PO) Take by mouth every morning. Zeaxanthin 70m/Lutein 140m    tacrolimus (PROTOPIC) 0.1 % ointment Apply 1-2 times daily as needed for itch to affected areas. 60 g 2   No current facility-administered medications for this visit.   PHYSICAL EXAMINATION: ECOG PERFORMANCE STATUS: 0 - Asymptomatic  BP (!) 176/85   Pulse 70   Temp 98.7 F (37.1 C)   Resp 18   Wt 240 lb 3.2 oz (109 kg)   BMI 42.55 kg/m   Filed Weights   10/05/21 1031  Weight: 240 lb 3.2 oz (109 kg)    Physical Activity: Unknown   Days of Exercise per Week: 0 days   Minutes of Exercise per Session: Not on file   Physical Exam HENT:     Head: Normocephalic and atraumatic.     Mouth/Throat:     Pharynx: No oropharyngeal exudate.  Eyes:     Pupils: Pupils are equal, round, and reactive to light.  Cardiovascular:     Rate and Rhythm: Normal rate and regular rhythm.  Pulmonary:     Effort: No respiratory distress.  Breath  sounds: No wheezing.  Abdominal:     General: Bowel sounds are normal. There is no distension.     Palpations: Abdomen is soft. There is no mass.     Tenderness: There is no abdominal tenderness. There is no guarding or rebound.  Musculoskeletal:        General: No tenderness. Normal range of motion.     Cervical back: Normal range of motion and neck supple.  Skin:    General: Skin is warm.  Neurological:     Mental Status: She is alert and oriented to person, place, and time.  Psychiatric:        Mood and Affect: Affect normal.   LABORATORY DATA:  I have reviewed the data as listed    Component Value Date/Time   NA 137 10/05/2021 0955   NA 138 09/03/2013 0809   K 3.8 10/05/2021 0955   K 4.3 11/02/2014 0932   CL 101 10/05/2021 0955   CL 105 09/03/2013 0809   CO2 28 10/05/2021 0955   CO2 28 09/03/2013 0809   GLUCOSE 117 (H) 10/05/2021 0955   GLUCOSE 100 (H) 09/03/2013 0809   BUN 21 10/05/2021 0955   BUN 17 09/03/2013 0809   CREATININE 0.91 10/05/2021 0955   CREATININE 0.96 11/02/2014 0929   CALCIUM 9.2 10/05/2021 0955   CALCIUM 9.1 09/03/2013 0809   PROT 7.0 10/05/2021 0955   PROT 6.6 11/02/2014 0929   ALBUMIN 3.8 10/05/2021 0955   ALBUMIN 3.3 (L) 11/02/2014 0929   AST 28 10/05/2021 0955   AST 16 11/02/2014 0929   ALT 24 10/05/2021 0955   ALT 19 11/02/2014 0929   ALKPHOS 86 10/05/2021 0955   ALKPHOS 87 11/02/2014 0929   BILITOT 0.4 10/05/2021 0955   BILITOT 0.3 11/02/2014 0929   GFRNONAA >60 10/05/2021 0955   GFRNONAA >60 11/02/2014 0929   GFRNONAA >60 07/27/2014 0836   GFRAA >60 07/05/2020 0923   GFRAA >60 11/02/2014 0929   GFRAA >60 07/27/2014 0836    No results found for: SPEP, UPEP  Lab Results  Component Value Date   WBC 5.2 10/05/2021   NEUTROABS 2.9 10/05/2021   HGB 12.9 10/05/2021   HCT 39.1 10/05/2021   MCV 86.9 10/05/2021   PLT 185 10/05/2021      Chemistry      Component Value Date/Time   NA 137 10/05/2021 0955   NA 138 09/03/2013 0809    K 3.8 10/05/2021 0955   K 4.3 11/02/2014 0932   CL 101 10/05/2021 0955   CL 105 09/03/2013 0809   CO2 28 10/05/2021 0955   CO2 28 09/03/2013 0809   BUN 21 10/05/2021 0955   BUN 17 09/03/2013 0809   CREATININE 0.91 10/05/2021 0955   CREATININE 0.96 11/02/2014 0929      Component Value Date/Time   CALCIUM 9.2 10/05/2021 0955   CALCIUM 9.1 09/03/2013 0809   ALKPHOS 86 10/05/2021 0955   ALKPHOS 87 11/02/2014 0929   AST 28 10/05/2021 0955   AST 16 11/02/2014 0929   ALT 24 10/05/2021 0955   ALT 19 11/02/2014 0929   BILITOT 0.4 10/05/2021 0955   BILITOT 0.3 11/02/2014 0929       RADIOGRAPHIC STUDIES: I have personally reviewed the radiological images as listed and agreed with the findings in the report. No results found.   ASSESSMENT & PLAN:  Carcinoma of upper-outer quadrant of right breast in female, estrogen receptor positive ([place]) # Right breast cancer stage I ER/PRPos; HER-2 negative.  Currently on anastrozole- ;  STABLE; mammo- Nov 2021- WNL; awaiting this yeay- NOV 2022 [PCP]  # Fatigue/Emotional labile- sec to AI- STABLE  # joint aches- G-1; on naproxen [1/day- Hx of back pain]-STABLE  # Bone density test-February 2021-normal limits continue calcium plus vitamin D. Will order BMD today.   # DISPOSITION:mychart # Follow up in  6 months- MD; labs- cbc/cmp; BMD prior--  Dr.B   Orders Placed This Encounter  Procedures   DG Bone Density    Standing Status:   Future    Standing Expiration Date:   10/05/2022    Order Specific Question:   Reason for Exam (SYMPTOM  OR DIAGNOSIS REQUIRED)    Answer:   osteoporosis/osteopenia    Order Specific Question:   Preferred imaging location?    Answer:   [facility]   CBC with Differential    Standing Status:   Future    Standing Expiration Date:   10/05/2022   Comprehensive metabolic panel    Standing Status:   Future    Standing Expiration Date:   10/05/2022   All questions were answered. The patient knows to call the  clinic with any problems, questions or concerns.      Cammie Sickle, MD 10/05/2021 1:01 PM

## 2021-10-05 NOTE — Assessment & Plan Note (Addendum)
#  Right breast cancer stage I ER/PRPos; HER-2 negative. Currently on anastrozole- ;  STABLE; mammo- Nov 2021- WNL; awaiting this yeay- NOV 2022 [PCP]  # Fatigue/Emotional labile- sec to AI- STABLE  # joint aches- G-1; on naproxen [1/day- Hx of back pain]-STABLE  # Bone density test-February 2021-normal limits continue calcium plus vitamin D. Will order BMD today.   # DISPOSITION:mychart # Follow up in  6 months- MD; labs- cbc/cmp; BMD prior--  Dr.B

## 2021-10-05 NOTE — Progress Notes (Signed)
Patient denies new problems/concerns today.   °

## 2021-10-19 ENCOUNTER — Other Ambulatory Visit: Payer: Self-pay | Admitting: Family Medicine

## 2021-10-19 DIAGNOSIS — I10 Essential (primary) hypertension: Secondary | ICD-10-CM

## 2021-10-20 ENCOUNTER — Other Ambulatory Visit: Payer: Self-pay

## 2021-10-20 ENCOUNTER — Ambulatory Visit
Admission: RE | Admit: 2021-10-20 | Discharge: 2021-10-20 | Disposition: A | Discharge status: Home / Self Care | Payer: Medicare Other | Source: Ambulatory Visit | Attending: Family Medicine | Admitting: Family Medicine

## 2021-10-20 DIAGNOSIS — R922 Inconclusive mammogram: Secondary | ICD-10-CM | POA: Diagnosis not present

## 2021-10-20 DIAGNOSIS — Z853 Personal history of malignant neoplasm of breast: Secondary | ICD-10-CM | POA: Diagnosis not present

## 2021-11-03 ENCOUNTER — Other Ambulatory Visit: Payer: Self-pay

## 2021-11-03 DIAGNOSIS — M25551 Pain in right hip: Secondary | ICD-10-CM

## 2021-11-03 MED ORDER — NAPROXEN 500 MG PO TABS: ORAL_TABLET | ORAL | 0 refills | Status: DC

## 2021-11-04 ENCOUNTER — Other Ambulatory Visit: Payer: Self-pay

## 2021-11-04 DIAGNOSIS — I10 Essential (primary) hypertension: Secondary | ICD-10-CM

## 2021-11-04 DIAGNOSIS — C50411 Malignant neoplasm of upper-outer quadrant of right female breast: Secondary | ICD-10-CM

## 2021-11-04 DIAGNOSIS — E039 Hypothyroidism, unspecified: Secondary | ICD-10-CM

## 2021-11-04 DIAGNOSIS — E785 Hyperlipidemia, unspecified: Secondary | ICD-10-CM

## 2021-11-04 DIAGNOSIS — L309 Dermatitis, unspecified: Secondary | ICD-10-CM

## 2021-11-04 DIAGNOSIS — Z170 Estrogen receptor positive status [ER+]: Secondary | ICD-10-CM

## 2021-11-04 DIAGNOSIS — K2100 Gastro-esophageal reflux disease with esophagitis, without bleeding: Secondary | ICD-10-CM

## 2021-11-04 MED ORDER — CHLORTHALIDONE 25 MG PO TABS: ORAL_TABLET | ORAL | 1 refills | Status: DC

## 2021-11-04 MED ORDER — LEVOTHYROXINE SODIUM 88 MCG PO TABS: 88.0000 ug | ORAL_TABLET | Freq: Every morning | ORAL | 3 refills | Status: DC

## 2021-11-04 MED ORDER — MOMETASONE FUROATE 0.1 % EX CREA: TOPICAL_CREAM | CUTANEOUS | 3 refills | Status: DC

## 2021-11-04 MED ORDER — LANSOPRAZOLE 30 MG PO CPDR: DELAYED_RELEASE_CAPSULE | ORAL | 1 refills | Status: DC

## 2021-11-04 MED ORDER — ATORVASTATIN CALCIUM 80 MG PO TABS: ORAL_TABLET | ORAL | 3 refills | Status: DC

## 2021-11-04 MED ORDER — LISINOPRIL 20 MG PO TABS: ORAL_TABLET | ORAL | 1 refills | Status: DC

## 2021-11-04 MED ORDER — ANASTROZOLE 1 MG PO TABS: ORAL_TABLET | ORAL | 0 refills | Status: DC

## 2021-11-04 MED ORDER — EZETIMIBE 10 MG PO TABS: ORAL_TABLET | ORAL | 3 refills | Status: DC

## 2021-11-04 NOTE — Progress Notes (Signed)
Lisinopril sent to [facility] patient changed pharmacies.  Aspin Palomarez,cma

## 2021-11-04 NOTE — Progress Notes (Signed)
All maintenance prescriptions was sent to the patients new pharmacy [facility].  Lucca Greggs,cma

## 2021-12-03 DIAGNOSIS — S4992XA Unspecified injury of left shoulder and upper arm, initial encounter: Secondary | ICD-10-CM | POA: Diagnosis not present

## 2021-12-03 DIAGNOSIS — Z8739 Personal history of other diseases of the musculoskeletal system and connective tissue: Secondary | ICD-10-CM | POA: Diagnosis not present

## 2021-12-03 DIAGNOSIS — M19012 Primary osteoarthritis, left shoulder: Secondary | ICD-10-CM | POA: Diagnosis not present

## 2021-12-03 DIAGNOSIS — S40012A Contusion of left shoulder, initial encounter: Secondary | ICD-10-CM | POA: Diagnosis not present

## 2021-12-06 ENCOUNTER — Ambulatory Visit: Payer: Medicare Other | Admitting: Family Medicine

## 2021-12-09 DIAGNOSIS — S7002XA Contusion of left hip, initial encounter: Secondary | ICD-10-CM | POA: Insufficient documentation

## 2021-12-09 DIAGNOSIS — M75100 Unspecified rotator cuff tear or rupture of unspecified shoulder, not specified as traumatic: Secondary | ICD-10-CM | POA: Insufficient documentation

## 2021-12-09 DIAGNOSIS — M7582 Other shoulder lesions, left shoulder: Secondary | ICD-10-CM | POA: Diagnosis not present

## 2021-12-09 DIAGNOSIS — M545 Low back pain, unspecified: Secondary | ICD-10-CM | POA: Diagnosis not present

## 2021-12-23 ENCOUNTER — Ambulatory Visit: Payer: Medicare Other | Admitting: Radiation Oncology

## 2022-01-03 ENCOUNTER — Ambulatory Visit (INDEPENDENT_AMBULATORY_CARE_PROVIDER_SITE_OTHER): Payer: Medicare Other | Admitting: Family Medicine

## 2022-01-03 ENCOUNTER — Encounter: Payer: Self-pay | Admitting: Family Medicine

## 2022-01-03 ENCOUNTER — Other Ambulatory Visit: Payer: Self-pay

## 2022-01-03 DIAGNOSIS — W19XXXA Unspecified fall, initial encounter: Secondary | ICD-10-CM

## 2022-01-03 DIAGNOSIS — M25512 Pain in left shoulder: Secondary | ICD-10-CM | POA: Diagnosis not present

## 2022-01-03 DIAGNOSIS — K2100 Gastro-esophageal reflux disease with esophagitis, without bleeding: Secondary | ICD-10-CM | POA: Diagnosis not present

## 2022-01-03 DIAGNOSIS — I10 Essential (primary) hypertension: Secondary | ICD-10-CM | POA: Diagnosis not present

## 2022-01-03 DIAGNOSIS — M545 Low back pain, unspecified: Secondary | ICD-10-CM | POA: Diagnosis not present

## 2022-01-03 MED ORDER — PREDNISONE 20 MG PO TABS: 40.0000 mg | ORAL_TABLET | Freq: Every day | ORAL | 0 refills | Status: DC

## 2022-01-03 NOTE — Progress Notes (Signed)
[name], MD Phone: [phone]  Bailey Brooks is a 71 y.o. female who presents today for f/u.  HYPERTENSION Disease Monitoring Home BP Monitoring "good" Chest pain- no    Dyspnea- no Medications Compliance-  taking chlorthalidone, lisinopril.  Edema- no BMET    Component Value Date/Time   NA 137 10/05/2021 0955   NA 138 09/03/2013 0809   K 3.8 10/05/2021 0955   K 4.3 11/02/2014 0932   CL 101 10/05/2021 0955   CL 105 09/03/2013 0809   CO2 28 10/05/2021 0955   CO2 28 09/03/2013 0809   GLUCOSE 117 (H) 10/05/2021 0955   GLUCOSE 100 (H) 09/03/2013 0809   BUN 21 10/05/2021 0955   BUN 17 09/03/2013 0809   CREATININE 0.91 10/05/2021 0955   CREATININE 0.96 11/02/2014 0929   CALCIUM 9.2 10/05/2021 0955   CALCIUM 9.1 09/03/2013 0809   GFRNONAA >60 10/05/2021 0955   GFRNONAA >60 11/02/2014 0929   GFRNONAA >60 07/27/2014 0836   GFRAA >60 07/05/2020 0923   GFRAA >60 11/02/2014 0929   GFRAA >60 07/27/2014 0836   GERD:   Reflux symptoms: no   Abd pain: no   Blood in stool: no  Dysphagia: no   EGD: reports she had one with [facility] in the past year  Medication: taking prevacid  Morbid obesity: The patient notes she has not really been working on diet.  She notes chronic back issues make it difficult for her to exercise.  She has been considering a nutritionist that several of her friends have used.  Chronic low back pain: This has been a chronic ongoing issue.  She notes over the last week she has had pain shooting down her right lateral leg if she is standing or laying down.  She reports a recent x-ray through orthopedics that was unchanged.  She notes no numbness, weakness, or incontinence.  Fall: The patient had a fall the week after Christmas.  She was carrying a remote control car outside and lost her balance.  She hit her left shoulder and left hip and scraped her back as well.  She was evaluated at the walk-in clinic and reports they did an x-ray that was  negative.  She noted no head injury or loss of consciousness.  She reports she followed up with her orthopedist who examined her and offered her 3 options of monitoring versus an injection versus physical therapy.  She opted to monitor.  She reports she follows up within the next week.  She is been taking Naprosyn for her left shoulder.    Social History   Tobacco Use  Smoking Status Never  Smokeless Tobacco Never    Current Outpatient Medications on File Prior to Visit  Medication Sig Dispense Refill   anastrozole (ARIMIDEX) 1 MG tablet TAKE 1 TABLET(1 MG) BY MOUTH DAILY 90 tablet 0   aspirin EC 81 MG tablet Take 81 mg by mouth daily.     atorvastatin (LIPITOR) 80 MG tablet TAKE 1 TABLET(80 MG) BY MOUTH DAILY 90 tablet 3   chlorthalidone (HYGROTON) 25 MG tablet TAKE 1/2 TABLET(12.5 MG) BY MOUTH DAILY 45 tablet 1   cholecalciferol (VITAMIN D3) 25 MCG (1000 UT) tablet Take 1,000 Units by mouth daily.     ezetimibe (ZETIA) 10 MG tablet TAKE 1 TABLET(10 MG) BY MOUTH DAILY 90 tablet 3   ketoconazole (NIZORAL) 2 % cream Apply twice daily as directed. 60 g 2   lansoprazole (PREVACID) 30 MG capsule Take 1 Capsule (30MG ) by mouth daily.  90 capsule 1   levothyroxine (SYNTHROID) 88 MCG tablet Take 1 tablet (88 mcg total) by mouth every morning. on an empty stomach 90 tablet 3   lisinopril (ZESTRIL) 20 MG tablet TAKE 1 TABLET(20 MG) BY MOUTH DAILY 90 tablet 1   mometasone (ELOCON) 0.1 % cream Apply to itchy spots on body 1-2 times until improved. Avoid face, groin, underarms. 50 g 3   Multiple Vitamin (MULTIVITAMIN) tablet Take 1 tablet by mouth daily.     naproxen (NAPROSYN) 500 MG tablet TAKE 1 TABLET(500 MG) BY MOUTH TWICE DAILY WITH FOOD AS NEEDED FOR MODERATE PAIN 30 tablet 0   Specialty Vitamins Products (ICAPS LUTEIN & ZEAXANTHIN PO) Take by mouth every morning. Zeaxanthin 4mg /Lutein 10mg      tacrolimus (PROTOPIC) 0.1 % ointment Apply 1-2 times daily as needed for itch to affected areas. 60 g 2    No current facility-administered medications on file prior to visit.     ROS see history of present illness  Objective  Physical Exam Vitals:   01/03/22 1010  BP: 118/80  Pulse: 88  Temp: 99.1 F (37.3 C)  SpO2: 97%    BP Readings from Last 3 Encounters:  01/03/22 118/80  10/05/21 (!) 176/85  09/07/21 113/71   Wt Readings from Last 3 Encounters:  01/03/22 239 lb (108.4 kg)  10/05/21 240 lb 3.2 oz (109 kg)  09/07/21 235 lb 12.8 oz (107 kg)    Physical Exam Constitutional:      General: She is not in acute distress.    Appearance: She is not diaphoretic.  Cardiovascular:     Rate and Rhythm: Normal rate and regular rhythm.     Heart sounds: Normal heart sounds.  Pulmonary:     Effort: Pulmonary effort is normal.     Breath sounds: Normal breath sounds.  Musculoskeletal:     Comments: No midline spine tenderness, no midline spine step-off, no muscular back tenderness, left shoulder with some tenderness anteriorly, there is discomfort on empty can testing on the left, she is able to abduct her left shoulder to 90 degrees  Skin:    General: Skin is warm and dry.  Neurological:     Mental Status: She is alert.     Comments: 5/5 strength bilateral quads, hamstrings, plantar flexion, and dorsiflexion, sensation light touch intact bilateral upper extremities, 2+ patellar reflexes     Assessment/Plan: Please see individual problem list.  Problem List Items Addressed This Visit     Essential hypertension    Well-controlled today.  She will continue chlorthalidone 12.5 mg daily and lisinopril 20 mg daily.      Fall    Patient with a fall resulting in left shoulder injury.  Imaging was reportedly negative.  She will follow-up with orthopedics.  Could consider physical therapy for falls preventions in the future.      GERD (gastroesophageal reflux disease)    Asymptomatic.  She will continue Prevacid 30 mg daily.      Left shoulder pain    Status post fall.   Reports negative x-ray.  She has seen orthopedics.  She may have a rotator cuff injury or biceps tendon injury.  She will follow-up with orthopedics as planned.      Low back pain    Chronic low back pain though she has had some radicular symptoms recently.  We will treat her with a short course of prednisone.  Discussed the risk of agitation, sleeping difficulty, and increased appetite.  If she has  any excessive side effects from this she will let us know.  I advised that she should not take the Naprosyn while on the prednisone.  Discussed risk of stomach irritation with both of these medications.      Relevant Medications   predniSONE (DELTASONE) 20 MG tablet   Morbid obesity due to excess calories (HCC)    Discussed dietary changes and exercise.  She will try to get in with a nutritionist.        Return in about 3 months (around 04/02/2022) for Weight follow-up.  This visit occurred during the SARS-CoV-2 public health emergency.  Safety protocols were in place, including screening questions prior to the visit, additional usage of staff PPE, and extensive cleaning of exam room while observing appropriate contact time as indicated for disinfecting solutions.    [name], MD [place]

## 2022-01-03 NOTE — Assessment & Plan Note (Signed)
Patient with a fall resulting in left shoulder injury.  Imaging was reportedly negative.  She will follow-up with orthopedics.  Could consider physical therapy for falls preventions in the future.

## 2022-01-03 NOTE — Assessment & Plan Note (Signed)
Discussed dietary changes and exercise.  She will try to get in with a nutritionist.

## 2022-01-03 NOTE — Assessment & Plan Note (Signed)
Well-controlled today.  She will continue chlorthalidone 12.5 mg daily and lisinopril 20 mg daily.

## 2022-01-03 NOTE — Patient Instructions (Signed)
Nice to see you. We will start you on prednisone to help with your pinched nerve in your back.  You should not take Naprosyn while taking the prednisone. Please follow-up with orthopedics as planned. Please try to establish with a nutritionist as we discussed.

## 2022-01-03 NOTE — Assessment & Plan Note (Signed)
Status post fall.  Reports negative x-ray.  She has seen orthopedics.  She may have a rotator cuff injury or biceps tendon injury.  She will follow-up with orthopedics as planned.

## 2022-01-03 NOTE — Assessment & Plan Note (Signed)
Asymptomatic.  She will continue Prevacid 30 mg daily.

## 2022-01-03 NOTE — Assessment & Plan Note (Signed)
Chronic low back pain though she has had some radicular symptoms recently.  We will treat her with a short course of prednisone.  Discussed the risk of agitation, sleeping difficulty, and increased appetite.  If she has any excessive side effects from this she will let us know.  I advised that she should not take the Naprosyn while on the prednisone.  Discussed risk of stomach irritation with both of these medications.

## 2022-01-06 ENCOUNTER — Emergency Department
Admission: EM | Admit: 2022-01-06 | Discharge: 2022-01-06 | Disposition: A | Discharge status: Home / Self Care | Payer: Medicare Other | Attending: Emergency Medicine | Admitting: Emergency Medicine

## 2022-01-06 ENCOUNTER — Telehealth: Payer: Self-pay | Admitting: Family Medicine

## 2022-01-06 ENCOUNTER — Other Ambulatory Visit: Payer: Self-pay

## 2022-01-06 ENCOUNTER — Encounter: Payer: Self-pay | Admitting: Intensive Care

## 2022-01-06 DIAGNOSIS — E039 Hypothyroidism, unspecified: Secondary | ICD-10-CM | POA: Insufficient documentation

## 2022-01-06 DIAGNOSIS — Z859 Personal history of malignant neoplasm, unspecified: Secondary | ICD-10-CM | POA: Insufficient documentation

## 2022-01-06 DIAGNOSIS — R55 Syncope and collapse: Secondary | ICD-10-CM | POA: Insufficient documentation

## 2022-01-06 LAB — CBC
HCT: 42.4 % (ref 36.0–46.0)
Hemoglobin: 13.7 g/dL (ref 12.0–15.0)
MCH: 28.7 pg (ref 26.0–34.0)
MCHC: 32.3 g/dL (ref 30.0–36.0)
MCV: 88.7 fL (ref 80.0–100.0)
Platelets: 255 10*3/uL (ref 150–400)
RBC: 4.78 MIL/uL (ref 3.87–5.11)
RDW: 13.6 % (ref 11.5–15.5)
WBC: 14.1 10*3/uL — ABNORMAL HIGH (ref 4.0–10.5)
nRBC: 0 % (ref 0.0–0.2)

## 2022-01-06 LAB — BASIC METABOLIC PANEL
Anion gap: 10 (ref 5–15)
BUN: 34 mg/dL — ABNORMAL HIGH (ref 8–23)
CO2: 28 mmol/L (ref 22–32)
Calcium: 9.6 mg/dL (ref 8.9–10.3)
Chloride: 99 mmol/L (ref 98–111)
Creatinine, Ser: 0.98 mg/dL (ref 0.44–1.00)
GFR, Estimated: >60 mL/min (ref >60)
Glucose, Bld: 160 mg/dL — ABNORMAL HIGH (ref 70–99)
Potassium: 3.3 mmol/L — ABNORMAL LOW (ref 3.5–5.1)
Sodium: 137 mmol/L (ref 135–145)

## 2022-01-06 LAB — URINALYSIS, ROUTINE W REFLEX MICROSCOPIC
Bilirubin Urine: NEGATIVE
Glucose, UA: NEGATIVE mg/dL
Ketones, ur: NEGATIVE mg/dL
Nitrite: NEGATIVE
Protein, ur: NEGATIVE mg/dL
Specific Gravity, Urine: 1.02 (ref 1.005–1.030)
pH: 5 (ref 5.0–8.0)

## 2022-01-06 LAB — URINALYSIS, MICROSCOPIC (REFLEX)

## 2022-01-06 LAB — TROPONIN I (HIGH SENSITIVITY)
Troponin I (High Sensitivity): 8 ng/L (ref <18)
Troponin I (High Sensitivity): 8 ng/L (ref <18)

## 2022-01-06 NOTE — Discharge Instructions (Signed)
Your tests today were all reassuring.  Your evaluation does not suggest any serious medical issue causing the passing out episode.  Continue taking your medicines, eating regularly, and drinking plenty of fluids and follow-up with your doctor for further evaluation of your symptoms.

## 2022-01-06 NOTE — ED Provider Notes (Signed)
[facility] Provider Note    Event Date/Time   First MD Initiated Contact with Patient 01/06/22 [phone]     (approximate)   History   Fall and Loss of Consciousness   HPI  Bailey Brooks is a 71 y.o. female with a history of hypothyroidism, GERD, chronic pain, cancer who comes ED complaining of syncope.  Reports that she was in her usual state of health, no symptoms, taking all her medications as usual, and then got into the shower.  In the hot water she started to feel lightheaded.  She got out and leaned on the sink and called out for her husband.  She then passed out.  She denies fall, denies any headache or neck pain.  No preceding symptoms such as chest pain shortness of breath abdominal pain severe headache vision changes paresthesias or weakness.  No pain or other acute symptoms afterward.  Currently asymptomatic.  Denies any recent illness vomiting diarrhea.  Reports she has been eating and drinking normally.  She did recently start prednisone 4 days ago and had just taken all of her medicines prior to getting in the shower.  No recent history of head trauma.     Physical Exam   Triage Vital Signs: ED Triage Vitals  Enc Vitals Group     BP 01/06/22 1529 129/65     Pulse Rate 01/06/22 1529 68     Resp 01/06/22 1529 20     Temp 01/06/22 1529 98.5 F (36.9 C)     Temp Source 01/06/22 1529 Oral     SpO2 01/06/22 1529 96 %     Weight 01/06/22 1526 240 lb (108.9 kg)     Height 01/06/22 1526 5\' 3"  (1.6 m)     Head Circumference --      Peak Flow --      Pain Score 01/06/22 1525 10     Pain Loc --      Pain Edu? --      Excl. in [place]? --     Most recent vital signs: Vitals:   01/06/22 1630 01/06/22 1800  BP: 111/71 127/63  Pulse: 76 (!) 58  Resp: 20 20  Temp:    SpO2: 95% 98%     General: Awake, no distress.  CV:  Good peripheral perfusion.  Regular rate and rhythm Resp:  Normal effort.  Clear to auscultation bilaterally Abd:  No  distention.  Soft and nontender Other:  Muscular soreness in the right mid back.  No bony point tenderness.  Spine is nontender.  Moist oral mucosa   ED Results / Procedures / Treatments   Labs (all labs ordered are listed, but only abnormal results are displayed) Labs Reviewed  BASIC METABOLIC PANEL - Abnormal; Notable for the following components:      Result Value   Potassium 3.3 (*)    Glucose, Bld 160 (*)    BUN 34 (*)    All other components within normal limits  CBC - Abnormal; Notable for the following components:   WBC 14.1 (*)    All other components within normal limits  URINALYSIS, ROUTINE W REFLEX MICROSCOPIC  TROPONIN I (HIGH SENSITIVITY)  TROPONIN I (HIGH SENSITIVITY)     EKG  Interpreted by me Normal sinus rhythm rate of 68.  Normal axis intervals QRS ST segments and T waves.   RADIOLOGY     PROCEDURES:  Critical Care performed: No  Procedures   MEDICATIONS ORDERED IN ED: Medications -  No data to display   IMPRESSION / MDM / [place] / ED COURSE  I reviewed the triage vital signs and the nursing notes.                              Differential diagnosis includes, but is not limited to, dehydration, heat syncope, vasovagal episode, electrolyte abnormality, anemia, non-STEMI    Patient presents with syncope.  Vital signs are normal, no acute prodromal symptoms.  Currently asymptomatic.  With her health history, including age and obesity, will obtain labs serial troponins.  She is tolerating oral intake and taking fluids orally to hydrate.  Orthostatic vital signs are negative.   ----------------------------------------- 7:15 PM on 01/06/2022 ----------------------------------------- Labs including serial troponin unremarkable.     FINAL CLINICAL IMPRESSION(S) / ED DIAGNOSES   Final diagnoses:  Syncope, unspecified syncope type     Rx / DC Orders   ED Discharge Orders     None        Note:  This document was  prepared using Dragon voice recognition software and may include unintentional dictation errors.   Carrie Mew, MD 01/06/22 [phone]

## 2022-01-06 NOTE — Telephone Encounter (Signed)
Patient called in stated she passed out today after  the 4th day prednisone feeling dizzy and weak  transfer patient to access

## 2022-01-06 NOTE — ED Notes (Signed)
Pt provided discharge instructions and prescription information. Pt was given the opportunity to ask questions and questions were answered. Discharge signature not obtained in the setting of the COVID-19 pandemic in order to reduce high touch surfaces.  ° °

## 2022-01-06 NOTE — ED Triage Notes (Signed)
Patient presents to ER after loss of consciousness after showering today. Patient reports she started feeling lightheaded and yelled for her husband and then when he came to the restroom she was flat on her back unconscious. Last about 30 seconds. Pt c/o back pain. Reports taking her medication before shower. Just started prednisone. A&O x4 in triage

## 2022-01-06 NOTE — Telephone Encounter (Signed)
Went to ED today.  Audrianna Driskill,cma

## 2022-01-10 ENCOUNTER — Encounter: Payer: Self-pay | Admitting: Family Medicine

## 2022-01-10 ENCOUNTER — Other Ambulatory Visit: Payer: Self-pay | Admitting: Internal Medicine

## 2022-01-10 DIAGNOSIS — Z170 Estrogen receptor positive status [ER+]: Secondary | ICD-10-CM

## 2022-01-10 NOTE — Telephone Encounter (Signed)
Patient called to set up a virtual. Where could we schedule patient with Dr Caryl Bis for ED follow up and cold symptoms?

## 2022-01-11 ENCOUNTER — Telehealth (INDEPENDENT_AMBULATORY_CARE_PROVIDER_SITE_OTHER): Payer: Medicare Other | Admitting: Family Medicine

## 2022-01-11 ENCOUNTER — Other Ambulatory Visit: Payer: Self-pay

## 2022-01-11 ENCOUNTER — Encounter: Payer: Self-pay | Admitting: Family Medicine

## 2022-01-11 VITALS — Ht 63.0 in | Wt 240.0 lb

## 2022-01-11 DIAGNOSIS — J069 Acute upper respiratory infection, unspecified: Secondary | ICD-10-CM

## 2022-01-11 DIAGNOSIS — R8290 Unspecified abnormal findings in urine: Secondary | ICD-10-CM | POA: Diagnosis not present

## 2022-01-11 DIAGNOSIS — R55 Syncope and collapse: Secondary | ICD-10-CM

## 2022-01-11 NOTE — Assessment & Plan Note (Signed)
Discussed symptoms could be related to vasovagal syncope or related to heat from the shower.  It appears that syncope is a listed side effect with prednisone though it does not give a percentage chance that this could happen.  It is also possible she had some cardiac event.  Potentially she could have been somewhat dehydrated given that her BUN/creatinine ratio was elevated.  I discussed all of these things with the patient.  I advised a cardiology referral given that there is not a clear-cut cause for her syncope.  This referral was placed.  Advised if she had recurrent syncope she would need to go back to the emergency room.

## 2022-01-11 NOTE — Progress Notes (Signed)
Virtual Visit via video Note  This visit type was conducted due to national recommendations for restrictions regarding the COVID-19 pandemic (e.g. social distancing).  This format is felt to be most appropriate for this patient at this time.  All issues noted in this document were discussed and addressed.  No physical exam was performed (except for noted visual exam findings with Video Visits).   I connected with Bailey Brooks today at  3:15 PM EST by a video enabled telemedicine application and verified that I am speaking with the correct person using two identifiers. Location patient: home Location provider: work  Persons participating in the virtual visit: patient, provider  I discussed the limitations, risks, security and privacy concerns of performing an evaluation and management service by telephone and the availability of in person appointments. I also discussed with the patient that there may be a patient responsible charge related to this service. The patient expressed understanding and agreed to proceed.  Reason for visit: Follow-up.  HPI: Syncope: Patient notes she started to feel "funny" when she got into the shower.  She subsequently got out of the shower and then leaned over the bathroom counter and felt like she was going to pass out.  She subsequently felt woozy and weak and then passed out.  She noted no chest pain, shortness of breath, or palpitations preceding the event.  She had not had any vomiting or diarrhea.  She had eaten prior to getting into the shower.  She had been on prednisone for 4 days prior to this event.  She notes her husband reports she was out for 30 seconds.  She does report hurting her back though her back has been progressively improving with alternating Tylenol and ibuprofen.  She went to the emergency room and generally had a reassuring work-up.  Her white blood cell count was elevated.  Her EKG was reassuring.  She had negative troponins.  Upper  respiratory infection: Patient notes onset of symptoms on 01/07/2021.  She has had congestion and is blowing green mucus out of her nose.  She notes rhinorrhea and postnasal drip.  She had sore throat at first though that has improved.  No taste or smell disturbances.  No shortness of breath or fever.  No known COVID exposure though her symptoms started the day after she was in the emergency department.  She has been taking Tylenol and ibuprofen.  Also using Flonase.  The patient asks about the bacteria that was seen on her urine microscopy.  She notes they did not provide her with a cleansing wipe to use prior to providing the urine sample.  She notes no dysuria, frequency, or urgency.   ROS: See pertinent positives and negatives per HPI.  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 11/2016 Echo: EF 60-65%, no rwma, mild LVH, Gr1 DD, triv MR, mildly dil LA, nl RV fxn.   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 Madelynn Done s/p RCHOP and methotrexate, c/b renal failure   Diverticulosis    Esophagitis    Family history of breast cancer  Family history of melanoma    Family history of prostate cancer    GERD (gastroesophageal reflux disease)    H/O stem cell transplant ([place]) 06/2011   a. in setting of lymphoma.   Hemorrhoids    History of chemotherapy 22-Oct-2010   RHCOP/methotrexate-intrathecal   History of stress test    a. 05/2010 Myoview: nl EF, no ischemia/infarct.   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, extra-mammary    Personal history of radiation therapy 2020-2021   right breast ca   TIA (transient ischemic attack)    X 2 -LAST ONE IN 2017    Past Surgical History:  Procedure Laterality Date   ABDOMINAL HYSTERECTOMY  1985   Hysterectomy-partial   BREAST BIOPSY Right 2002   Neg - AT [facility]   BREAST BIOPSY Right 2020   bx done at [facility]?, IDC and DCIS   BREAST LUMPECTOMY Right 10/16/2019   IDC and DCIS, negative LN   BURR HOLE W/ PLACEMENT OMMAYA RESERVOIR     BURR HOLE W/ PLACEMENT OMMAYA RESERVOIR  2012   COLONOSCOPY  04/2013   ESOPHAGOGASTRODUODENOSCOPY  04/2013   INJECTION KNEE  07/02/2018   INSERTION CENTRAL VENOUS ACCESS DEVICE W/ SUBCUTANEOUS PORT  2011   Port a Cath: Right chest Double Lumen, 04-Nov-2010   LIMBAL STEM CELL TRANSPLANT  2012   LIVER BIOPSY     stage 4B large Bcell lymphoma   PARTIAL MASTECTOMY WITH NEEDLE LOCALIZATION AND AXILLARY SENTINEL LYMPH NODE BX Right 10/16/2019   Procedure: PARTIAL MASTECTOMY WITH NEEDLE LOCALIZATION AND AXILLARY SENTINEL LYMPH NODE BX;  Surgeon: Benjamine Sprague, DO;  Location: [facility] ORS;  Service: General;  Laterality: Right;   ROTATOR CUFF REPAIR Right 2016   SHOULDER ARTHROSCOPY Right 06/22/2015   Procedure: ARTHROSCOPY SHOULDER, parital repair of rotator cuff, biceps tenodesis, decompression and debridement;  Surgeon: Corky Mull, MD;  Location: [facility] ORS;  Service: Orthopedics;  Laterality: Right;   [place] PARTIAL N/A 09/29/2015   Procedure: VULVECTOMY PARTIAL;  Surgeon: Gillis Ends, MD;  Location: [facility] ORS;  Service: Gynecology;  Laterality: N/A;    Family History  Problem Relation Age of Onset   Diabetes Mother        died @ 17 of MI.   Hypertension Mother    Heart attack Mother    Melanoma Father 39       died of complications r/t melanoma w/ lung mets.   Kidney disease Sister        Kidney removed    Breast cancer  Sister 4   Prostate cancer Brother        Prostate - dx in 32's   Heart disease Brother 25       reported MI @ age 25, ? treated w/ TPA->no recurrent CAD, now in 65's.   Hearing loss Maternal Aunt    Cancer Maternal Aunt        pancreatic vs colon cancer   Hearing loss Maternal Grandfather    Other Maternal Grandmother        flu pandemic   Kidney cancer Niece 98       partial nephrectomy    SOCIAL HX:  Non-smoker   Current Outpatient Medications:    anastrozole (ARIMIDEX) 1 MG tablet, TAKE 1 TABLET(1 MG) BY MOUTH DAILY, Disp: 90 tablet, Rfl: 0   aspirin EC 81 MG tablet, Take 81 mg by mouth daily., Disp: , Rfl:    atorvastatin (LIPITOR) 80 MG tablet, TAKE 1 TABLET(80 MG) BY MOUTH DAILY, Disp: 90 tablet, Rfl: 3   chlorthalidone (HYGROTON) 25 MG tablet, TAKE 1/2 TABLET(12.5 MG) BY MOUTH DAILY, Disp: 45 tablet, Rfl: 1   cholecalciferol (VITAMIN D3) 25 MCG (1000 UT) tablet, Take 1,000 Units by mouth daily., Disp: , Rfl:    ezetimibe (ZETIA) 10 MG tablet, TAKE 1 TABLET(10 MG) BY MOUTH DAILY, Disp: 90 tablet, Rfl: 3   ketoconazole (NIZORAL) 2 % cream, Apply twice daily as directed., Disp: 60 g, Rfl: 2   lansoprazole (PREVACID) 30 MG capsule, Take 1 Capsule (30MG ) by mouth daily., Disp: 90 capsule, Rfl: 1   levothyroxine (SYNTHROID) 88 MCG tablet, Take 1 tablet (88 mcg total) by mouth every morning. on an empty stomach, Disp: 90 tablet, Rfl: 3   lisinopril (ZESTRIL) 20 MG tablet, TAKE 1 TABLET(20 MG) BY MOUTH DAILY, Disp: 90 tablet, Rfl: 1   mometasone (ELOCON) 0.1 % cream, Apply to itchy spots on body 1-2 times until improved. Avoid face, groin, underarms., Disp: 50 g, Rfl: 3   Multiple Vitamin (MULTIVITAMIN) tablet, Take 1 tablet by mouth daily., Disp: , Rfl:    naproxen (NAPROSYN) 500 MG tablet, TAKE 1 TABLET(500 MG) BY MOUTH TWICE DAILY WITH FOOD AS NEEDED FOR MODERATE PAIN, Disp: 30 tablet, Rfl: 0   Specialty Vitamins Products (ICAPS LUTEIN & ZEAXANTHIN PO), Take by mouth every morning.  Zeaxanthin 4mg /Lutein 10mg , Disp: , Rfl:    tacrolimus (PROTOPIC) 0.1 % ointment, Apply 1-2 times daily as needed for itch to affected areas., Disp: 60 g, Rfl: 2   predniSONE (DELTASONE) 20 MG tablet, Take 2 tablets (40 mg total) by mouth daily with breakfast. (Patient not taking: Reported on 01/11/2022), Disp: 10 tablet, Rfl: 0  EXAM:  VITALS per patient if applicable:  GENERAL: alert, oriented, appears well and in no acute distress  HEENT: atraumatic, conjunttiva clear, no obvious abnormalities on inspection of external nose and ears  NECK: normal movements of the head and neck  LUNGS: on inspection no signs of respiratory distress, breathing rate appears normal, no obvious gross SOB, gasping or wheezing  CV: no obvious cyanosis  MS: moves all visible extremities without noticeable abnormality  PSYCH/NEURO: pleasant and cooperative, no obvious depression or anxiety, speech and thought processing grossly intact  ASSESSMENT AND PLAN:  Discussed the following assessment and plan:  Problem List Items Addressed This Visit     Abnormal urinalysis    Discussed that the urine results are likely related to this being a nonclean-catch.  Discussed in the absence of symptoms nothing else needed to be done for this finding.      Syncope - Primary    Discussed symptoms could be related to vasovagal syncope or related to heat from the shower.  It appears that syncope is a listed side effect with prednisone though it does not give a percentage chance that this could happen.  It is also possible she had some cardiac event.  Potentially she could have been somewhat dehydrated given that her BUN/creatinine ratio was elevated.  I discussed all of these things with the patient.  I advised a cardiology referral given that there is not a clear-cut cause for her syncope.  This referral was  placed.  Advised if she had recurrent syncope she would need to go back to the emergency room.      Relevant Orders    Ambulatory referral to Cardiology   Upper respiratory infection    I discussed that this was likely a viral illness.  Discussed that COVID is a possibility.  I encouraged her to take a home COVID test.  Discussed continued Flonase use and alternating Tylenol and ibuprofen.  If she is not progressively improving over the next 5 or so days she will let us know.  Advised to let me know if her home COVID test was positive.       No follow-ups on file.   I discussed the assessment and treatment plan with the patient. The patient was provided an opportunity to ask questions and all were answered. The patient agreed with the plan and demonstrated an understanding of the instructions.   The patient was advised to call back or seek an in-person evaluation if the symptoms worsen or if the condition fails to improve as anticipated.  I have spent 30 minutes in the care of this patient regarding history taking, documentation, review of recent ED visit, discussion of plan, placing a referral.    [name], MD

## 2022-01-11 NOTE — Assessment & Plan Note (Signed)
Discussed that the urine results are likely related to this being a nonclean-catch.  Discussed in the absence of symptoms nothing else needed to be done for this finding.

## 2022-01-11 NOTE — Assessment & Plan Note (Signed)
I discussed that this was likely a viral illness.  Discussed that COVID is a possibility.  I encouraged her to take a home COVID test.  Discussed continued Flonase use and alternating Tylenol and ibuprofen.  If she is not progressively improving over the next 5 or so days she will let us know.  Advised to let me know if her home COVID test was positive.

## 2022-01-12 ENCOUNTER — Other Ambulatory Visit: Payer: Self-pay | Admitting: Family Medicine

## 2022-01-12 DIAGNOSIS — M25551 Pain in right hip: Secondary | ICD-10-CM

## 2022-01-12 NOTE — Telephone Encounter (Signed)
Discuss during office visit. It is the same EKG that was just scanned in.

## 2022-01-13 ENCOUNTER — Encounter: Payer: Self-pay | Admitting: Radiation Oncology

## 2022-01-13 ENCOUNTER — Ambulatory Visit
Admission: RE | Admit: 2022-01-13 | Discharge: 2022-01-13 | Disposition: A | Discharge status: Home / Self Care | Payer: Medicare Other | Source: Ambulatory Visit | Attending: Radiation Oncology | Admitting: Radiation Oncology

## 2022-01-13 ENCOUNTER — Other Ambulatory Visit: Payer: Self-pay

## 2022-01-13 VITALS — BP 141/87 | HR 92 | Temp 98.2°F | Resp 20 | Wt 237.6 lb

## 2022-01-13 DIAGNOSIS — C50911 Malignant neoplasm of unspecified site of right female breast: Secondary | ICD-10-CM | POA: Insufficient documentation

## 2022-01-13 DIAGNOSIS — Z79811 Long term (current) use of aromatase inhibitors: Secondary | ICD-10-CM | POA: Diagnosis not present

## 2022-01-13 DIAGNOSIS — Z923 Personal history of irradiation: Secondary | ICD-10-CM | POA: Insufficient documentation

## 2022-01-13 DIAGNOSIS — Z08 Encounter for follow-up examination after completed treatment for malignant neoplasm: Secondary | ICD-10-CM | POA: Diagnosis not present

## 2022-01-13 DIAGNOSIS — Z170 Estrogen receptor positive status [ER+]: Secondary | ICD-10-CM | POA: Diagnosis not present

## 2022-01-13 DIAGNOSIS — C50411 Malignant neoplasm of upper-outer quadrant of right female breast: Secondary | ICD-10-CM | POA: Diagnosis not present

## 2022-01-13 NOTE — Progress Notes (Signed)
Radiation Oncology Follow up Note  Name: Bailey Brooks   Date:   01/13/2022 MRN:  [id number] DOB: [birth year]    This 71 y.o. 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  REFERRING PROVIDER: Leone Haven, MD  HPI: Patient is a 71 year old female now 2 years having completed whole breast radiation to her right breast for stage Ia ER positive base of mammary carcinoma.  Seen today in routine follow-up she is doing well.  She specifically denies breast tenderness cough or bone pain..  She had mammograms in November which I have reviewed were BI-RADS 2 benign.  She is currently on Arimidex tolerating that well without side effect.  COMPLICATIONS OF TREATMENT: none  FOLLOW UP COMPLIANCE: keeps appointments   PHYSICAL EXAM:  BP (!) 141/87 (BP Location: Left Wrist, Patient Position: Sitting, Cuff Size: Small)    Pulse 92    Temp 98.2 F (36.8 C) (Tympanic)    Resp 20    Wt 237 lb 9.6 oz (107.8 kg)    BMI 42.09 kg/m  Lungs are clear to A&P cardiac examination essentially unremarkable with regular rate and rhythm. No dominant mass or nodularity is noted in either breast in 2 positions examined. Incision is well-healed. No axillary or supraclavicular adenopathy is appreciated. Cosmetic result is excellent.  Well-developed well-nourished patient in NAD. HEENT reveals PERLA, EOMI, discs not visualized.  Oral cavity is clear. No oral mucosal lesions are identified. Neck is clear without evidence of cervical or supraclavicular adenopathy. Lungs are clear to A&P. Cardiac examination is essentially unremarkable with regular rate and rhythm without murmur rub or thrill. Abdomen is benign with no organomegaly or masses noted. Motor sensory and DTR levels are equal and symmetric in the upper and lower extremities. Cranial nerves II through XII are grossly intact. Proprioception is intact. No  peripheral adenopathy or edema is identified. No motor or sensory levels are noted. Crude visual fields are within normal range.  RADIOLOGY RESULTS: Mammograms reviewed compatible with above-stated findings  PLAN: Present time patient is now 2 years out with no evidence of disease.  And pleased with her overall progress.  I have asked to see her back in 1 year for follow-up.  She continues on Arimidex without side effect.  Patient is to call with any concerns.  I would like to take this opportunity to thank you for allowing me to participate in the care of your patient.Noreene Filbert, MD

## 2022-01-16 ENCOUNTER — Other Ambulatory Visit: Payer: Self-pay

## 2022-01-16 ENCOUNTER — Ambulatory Visit
Admission: RE | Admit: 2022-01-16 | Discharge: 2022-01-16 | Disposition: A | Discharge status: Home / Self Care | Payer: Medicare Other | Source: Ambulatory Visit | Attending: Internal Medicine | Admitting: Internal Medicine

## 2022-01-16 DIAGNOSIS — Z170 Estrogen receptor positive status [ER+]: Secondary | ICD-10-CM | POA: Insufficient documentation

## 2022-01-16 DIAGNOSIS — Z79811 Long term (current) use of aromatase inhibitors: Secondary | ICD-10-CM | POA: Diagnosis not present

## 2022-01-16 DIAGNOSIS — C50411 Malignant neoplasm of upper-outer quadrant of right female breast: Secondary | ICD-10-CM | POA: Diagnosis not present

## 2022-01-16 DIAGNOSIS — Z780 Asymptomatic menopausal state: Secondary | ICD-10-CM | POA: Diagnosis not present

## 2022-01-23 ENCOUNTER — Encounter: Payer: Self-pay | Admitting: Family Medicine

## 2022-01-24 ENCOUNTER — Encounter: Payer: Self-pay | Admitting: Family Medicine

## 2022-01-24 MED ORDER — AMOXICILLIN-POT CLAVULANATE 875-125 MG PO TABS: 1.0000 | ORAL_TABLET | Freq: Two times a day (BID) | ORAL | 0 refills | Status: DC

## 2022-01-24 NOTE — Telephone Encounter (Signed)
See the other mychart message.

## 2022-02-06 ENCOUNTER — Other Ambulatory Visit: Payer: Self-pay

## 2022-02-06 ENCOUNTER — Encounter: Payer: Self-pay | Admitting: Family Medicine

## 2022-02-06 ENCOUNTER — Ambulatory Visit (INDEPENDENT_AMBULATORY_CARE_PROVIDER_SITE_OTHER): Payer: Medicare Other | Admitting: Family Medicine

## 2022-02-06 VITALS — BP 118/70 | HR 88 | Temp 98.9°F | Ht 63.0 in | Wt 239.4 lb

## 2022-02-06 DIAGNOSIS — E876 Hypokalemia: Secondary | ICD-10-CM | POA: Diagnosis not present

## 2022-02-06 DIAGNOSIS — I10 Essential (primary) hypertension: Secondary | ICD-10-CM

## 2022-02-06 DIAGNOSIS — R5383 Other fatigue: Secondary | ICD-10-CM | POA: Diagnosis not present

## 2022-02-06 DIAGNOSIS — B3731 Acute candidiasis of vulva and vagina: Secondary | ICD-10-CM

## 2022-02-06 DIAGNOSIS — J069 Acute upper respiratory infection, unspecified: Secondary | ICD-10-CM

## 2022-02-06 DIAGNOSIS — M545 Low back pain, unspecified: Secondary | ICD-10-CM

## 2022-02-06 LAB — BASIC METABOLIC PANEL
BUN: 27 mg/dL — ABNORMAL HIGH (ref 6–23)
CO2: 29 mEq/L (ref 19–32)
Calcium: 9.5 mg/dL (ref 8.4–10.5)
Chloride: 102 mEq/L (ref 96–112)
Creatinine, Ser: 0.93 mg/dL (ref 0.40–1.20)
GFR: 61.61 mL/min (ref >60.00)
Glucose, Bld: 118 mg/dL — ABNORMAL HIGH (ref 70–99)
Potassium: 4 mEq/L (ref 3.5–5.1)
Sodium: 139 mEq/L (ref 135–145)

## 2022-02-06 MED ORDER — FLUCONAZOLE 150 MG PO TABS
150.0000 mg | ORAL_TABLET | ORAL | 0 refills | Status: AC
Start: 2022-02-06 — End: 2022-02-10

## 2022-02-06 NOTE — Patient Instructions (Signed)
Nice to see you. ?Please stop the chlorthalidone.  If you have rising blood pressures or swelling in your legs please let us know. ?Please take the Diflucan.  If this does not help resolve your yeast infection please let us know. ?Please monitor your blood pressure closely. ?Please contact your orthopedist for follow-up on your back. ?

## 2022-02-06 NOTE — Assessment & Plan Note (Signed)
Improving.  At this point I do not think there is any indication for additional antibiotics.  She will monitor to resolution. ?

## 2022-02-06 NOTE — Assessment & Plan Note (Signed)
Chronic low back pain.  I encouraged her to follow-up with her orthopedic specialist.  Discussed that she may need an MRI or to see a spine specialist. ?

## 2022-02-06 NOTE — Assessment & Plan Note (Signed)
Patient reports this is an ongoing issue.  This may be medication related.  Her anastrozole may be playing a role.  She will remain on that per her oncologist.  Her blood pressure being borderline low may also be playing a role.  We will stop her chlorthalidone and see if that helps.  We will also recheck her potassium. ?

## 2022-02-06 NOTE — Assessment & Plan Note (Signed)
Symptoms concerning for yeast vaginitis in the setting of recent antibiotic use.  Discussed treatment with Diflucan.  If its not improving she will let us know. ?

## 2022-02-06 NOTE — Progress Notes (Signed)
?Bailey Rumps, MD ?Phone: [phone] ? ?Bailey Brooks is a 71 y.o. female who presents today for follow-up. ? ?Congestion: Patient notes this has improved significantly.  No cough or fever.  She does feel fatigued and washed out though notes that was going on prior to her illness.  Notes that worsened with her respiratory illness.  She stopped her Augmentin after 4 days given that she developed a yeast infection. ? ?Yeast infection: Patient reports some vaginal discharge and itching.  This has been improving since stopping the Augmentin. ? ?Fatigue: This is a chronic ongoing issue.  She wonders if it is related to her anastrozole.  She also wonders if it could be related to her blood pressure running low.  Notes her BPs have ranged from the 100s-120s/50s-60s typically.  No lightheadedness.  She takes lisinopril and chlorthalidone. ? ?Chronic low back pain: This has been an ongoing issue.  Notes it is still bothering her.  Notes it is hard to get around at times.  She saw orthopedics previously and notes she had an x-ray that was unchanged from previously.  She notes she is going to schedule another appointment with orthopedics. ? ?Social History  ? ?Tobacco Use  ?Smoking Status Never  ?Smokeless Tobacco Never  ? ? ?Current Outpatient Medications on File Prior to Visit  ?Medication Sig Dispense Refill  ? amoxicillin-clavulanate (AUGMENTIN) 875-125 MG tablet Take 1 tablet by mouth 2 (two) times daily. 14 tablet 0  ? anastrozole (ARIMIDEX) 1 MG tablet TAKE 1 TABLET(1 MG) BY MOUTH DAILY 90 tablet 0  ? aspirin EC 81 MG tablet Take 81 mg by mouth daily.    ? atorvastatin (LIPITOR) 80 MG tablet TAKE 1 TABLET(80 MG) BY MOUTH DAILY 90 tablet 3  ? cholecalciferol (VITAMIN D3) 25 MCG (1000 UT) tablet Take 1,000 Units by mouth daily.    ? ezetimibe (ZETIA) 10 MG tablet TAKE 1 TABLET(10 MG) BY MOUTH DAILY 90 tablet 3  ? ketoconazole (NIZORAL) 2 % cream Apply twice daily as directed. 60 g 2  ? lansoprazole (PREVACID) 30  MG capsule Take 1 Capsule ('30MG'$ ) by mouth daily. 90 capsule 1  ? levothyroxine (SYNTHROID) 88 MCG tablet Take 1 tablet (88 mcg total) by mouth every morning. on an empty stomach 90 tablet 3  ? lisinopril (ZESTRIL) 20 MG tablet TAKE 1 TABLET(20 MG) BY MOUTH DAILY 90 tablet 1  ? mometasone (ELOCON) 0.1 % cream Apply to itchy spots on body 1-2 times until improved. Avoid face, groin, underarms. 50 g 3  ? Multiple Vitamin (MULTIVITAMIN) tablet Take 1 tablet by mouth daily.    ? naproxen (NAPROSYN) 500 MG tablet TAKE ONE TABLET BY MOUTH TWICE DAILY AS NEEDED FOR MODERATE PAIN. TAKE WITH FOOD. 30 tablet 0  ? predniSONE (DELTASONE) 20 MG tablet Take 2 tablets (40 mg total) by mouth daily with breakfast. 10 tablet 0  ? Specialty Vitamins Products (ICAPS LUTEIN & ZEAXANTHIN PO) Take by mouth every morning. Zeaxanthin '4mg'$ /Lutein '10mg'$     ? tacrolimus (PROTOPIC) 0.1 % ointment Apply 1-2 times daily as needed for itch to affected areas. 60 g 2  ? ?No current facility-administered medications on file prior to visit.  ? ? ? ?ROS see history of present illness ? ?Objective ? ?Physical Exam ?Vitals:  ? 02/06/22 0911  ?BP: 118/70  ?Pulse: 88  ?Temp: 98.9 ?F (37.2 ?C)  ?SpO2: 98%  ? ? ?BP Readings from Last 3 Encounters:  ?02/06/22 118/70  ?01/13/22 (!) 141/87  ?01/06/22 134/73  ? ?  Wt Readings from Last 3 Encounters:  ?02/06/22 239 lb 6.4 oz (108.6 kg)  ?01/13/22 237 lb 9.6 oz (107.8 kg)  ?01/11/22 240 lb (108.9 kg)  ? ? ?Physical Exam ?Constitutional:   ?   General: She is not in acute distress. ?   Appearance: She is not diaphoretic.  ?Cardiovascular:  ?   Rate and Rhythm: Normal rate and regular rhythm.  ?   Heart sounds: Normal heart sounds.  ?Pulmonary:  ?   Effort: Pulmonary effort is normal.  ?   Breath sounds: Normal breath sounds.  ?Skin: ?   General: Skin is warm and dry.  ?Neurological:  ?   Mental Status: She is alert.  ? ? ? ?Assessment/Plan: Please see individual problem list. ? ?Problem List Items Addressed This Visit    ? ? Essential hypertension (Chronic)  ?  Blood pressure has been running borderline low.  We will have her discontinue the chlorthalidone.  She will continue to monitor her blood pressure at home.  I will see her back in 2 months.  She will continue lisinopril 20 mg once daily. ?  ?  ? Fatigue (Chronic)  ?  Patient reports this is an ongoing issue.  This may be medication related.  Her anastrozole may be playing a role.  She will remain on that per her oncologist.  Her blood pressure being borderline low may also be playing a role.  We will stop her chlorthalidone and see if that helps.  We will also recheck her potassium. ?  ?  ? Low back pain (Chronic)  ?  Chronic low back pain.  I encouraged her to follow-up with her orthopedic specialist.  Discussed that she may need an MRI or to see a spine specialist. ?  ?  ? Hypokalemia  ? Relevant Orders  ? Basic Metabolic Panel (BMET)  ? Upper respiratory infection  ?  Improving.  At this point I do not think there is any indication for additional antibiotics.  She will monitor to resolution. ?  ?  ? Relevant Medications  ? fluconazole (DIFLUCAN) 150 MG tablet  ? Yeast vaginitis - Primary  ?  Symptoms concerning for yeast vaginitis in the setting of recent antibiotic use.  Discussed treatment with Diflucan.  If its not improving she will let us know. ?  ?  ? Relevant Medications  ? fluconazole (DIFLUCAN) 150 MG tablet  ? ? ? ?Return in about 2 months (around 04/08/2022) for Hypertension. ? ?This visit occurred during the SARS-CoV-2 public health emergency.  Safety protocols were in place, including screening questions prior to the visit, additional usage of staff PPE, and extensive cleaning of exam room while observing appropriate contact time as indicated for disinfecting solutions.  ? ? ?Bailey Rumps, MD ?[place] ? ?

## 2022-02-06 NOTE — Assessment & Plan Note (Signed)
Blood pressure has been running borderline low.  We will have her discontinue the chlorthalidone.  She will continue to monitor her blood pressure at home.  I will see her back in 2 months.  She will continue lisinopril 20 mg once daily. ?

## 2022-02-14 ENCOUNTER — Other Ambulatory Visit: Payer: Self-pay | Admitting: Family

## 2022-02-14 DIAGNOSIS — M25551 Pain in right hip: Secondary | ICD-10-CM

## 2022-02-15 NOTE — Telephone Encounter (Signed)
Refill sent to pharmacy.   

## 2022-02-17 ENCOUNTER — Other Ambulatory Visit: Payer: Self-pay

## 2022-02-17 ENCOUNTER — Encounter: Payer: Self-pay | Admitting: Nurse Practitioner

## 2022-02-17 ENCOUNTER — Ambulatory Visit: Payer: Medicare Other | Admitting: Nurse Practitioner

## 2022-02-17 VITALS — BP 110/60 | HR 69 | Ht 63.0 in | Wt 241.0 lb

## 2022-02-17 DIAGNOSIS — R55 Syncope and collapse: Secondary | ICD-10-CM

## 2022-02-17 DIAGNOSIS — I10 Essential (primary) hypertension: Secondary | ICD-10-CM | POA: Diagnosis not present

## 2022-02-17 DIAGNOSIS — E782 Mixed hyperlipidemia: Secondary | ICD-10-CM | POA: Diagnosis not present

## 2022-02-17 NOTE — Patient Instructions (Signed)
Medication Instructions:  ?No changes at this time.  ? ?*If you need a refill on your cardiac medications before your next appointment, please call your pharmacy* ? ? ?Lab Work: ?None ? ?If you have labs (blood work) drawn today and your tests are completely normal, you will receive your results only by: ?MyChart Message (if you have MyChart) OR ?A paper copy in the mail ?If you have any lab test that is abnormal or we need to change your treatment, we will call you to review the results. ? ? ?Testing/Procedures: ?Your physician has requested that you have an echocardiogram. Echocardiography is a painless test that uses sound waves to create images of your heart. It provides your doctor with information about the size and shape of your heart and how well your heart?s chambers and valves are working. This procedure takes approximately one hour. There are no restrictions for this procedure. ? ? ? ?Follow-Up: ?At [facility], you and your health needs are our priority.  As part of our continuing mission to provide you with exceptional heart care, we have created designated Provider Care Teams.  These Care Teams include your primary Cardiologist (physician) and Advanced Practice Providers (APPs -  Physician Assistants and Nurse Practitioners) who all work together to provide you with the care you need, when you need it. ? ? ?Your next appointment:   ?1 month(s) ? ?The format for your next appointment:   ?In Person ? ?Provider:   ?Kathlyn Sacramento, MD or Murray Hodgkins, NP ?

## 2022-02-17 NOTE — Progress Notes (Signed)
? ?Cardiology Clinic Note  ? ?Patient Name: Bailey Brooks ?Date of Encounter: 02/17/2022 ? ?Primary Care Provider:  Leone Haven, MD ?Primary Cardiologist:  Bailey Sacramento, MD ? ?Patient Profile  ?  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 ? ?Past Medical History  ?  ?Past Medical History:  ?Diagnosis Date  ? 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 Eosinophilic Granuloma of the Lung)  ? Allergic rhinitis   ? Anemia   ? WHILE GOING THRU CHEMO  ? Arthritis   ? Breast cancer ([place]) 10/2019  ? right breast ca  ? Diastolic dysfunction   ? a. 11/2016 Echo: EF 60-65%, no rwma, mild LVH, Gr1 DD, triv MR, mildly dil LA, nl RV fxn.  ? 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 Dr Bailey Brooks, Dr. Madelynn Brooks s/p RCHOP and methotrexate, c/b renal failure  ? Diverticulosis   ? Esophagitis   ? Family history of breast cancer   ? Family history of melanoma   ? Family history of prostate cancer   ? GERD (gastroesophageal reflux disease)   ? H/O stem cell transplant ([place]) 06/2011  ? a. in setting of lymphoma.  ? Hemorrhoids   ? History of chemotherapy 22-Oct-2010  ? RHCOP/methotrexate-intrathecal  ? History of stress test   ? a. 05/2010 Myoview: nl EF, no ischemia/infarct.  ? Hypercholesterolemia   ? Hypertension   ? Hypothyroidism   ? Morbid obesity ([place])   ? Ommaya reservoir present   ? IN SCALP FROM 2012  ? 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 a. 2014 s/p resection.  ? Personal history of radiation therapy 2020-2021  ? right breast ca  ? Syncope   ? TIA (transient ischemic attack)   ? a. X 2 - last in 2018; b. 02/2017 event monitor: No significant arrhythmias.  ? ?Past Surgical History:  ?Procedure Laterality Date  ?  ABDOMINAL HYSTERECTOMY  1985  ? Hysterectomy-partial  ? BREAST BIOPSY Right 2002  ? Neg - AT [facility]  ? BREAST BIOPSY Right 2020  ? bx [facility] at [facility]?, IDC and DCIS  ? BREAST LUMPECTOMY Right 10/16/2019  ? IDC and DCIS, negative LN  ? BURR HOLE W/ PLACEMENT OMMAYA RESERVOIR    ? BURR HOLE W/ PLACEMENT OMMAYA RESERVOIR  2012  ? COLONOSCOPY  04/2013  ? ESOPHAGOGASTRODUODENOSCOPY  04/2013  ? INJECTION KNEE  07/02/2018  ? INSERTION CENTRAL VENOUS ACCESS DEVICE W/ SUBCUTANEOUS PORT  2011  ? Port a Cath: Right chest Double Lumen, 04-Nov-2010  ? LIMBAL STEM CELL TRANSPLANT  2012  ? LIVER BIOPSY    ? stage 4B large Bcell lymphoma  ? PARTIAL MASTECTOMY WITH NEEDLE LOCALIZATION AND AXILLARY SENTINEL LYMPH NODE BX Right 10/16/2019  ? Procedure: PARTIAL MASTECTOMY WITH NEEDLE LOCALIZATION AND AXILLARY SENTINEL LYMPH NODE BX;  Surgeon: Bailey Sprague, DO;  Location: [facility] ORS;  Service: General;  Laterality: Right;  ? ROTATOR CUFF REPAIR Right 2016  ? SHOULDER ARTHROSCOPY Right 06/22/2015  ? Procedure: ARTHROSCOPY SHOULDER, parital repair of rotator cuff, biceps tenodesis, decompression and debridement;  Surgeon: Bailey Mull, MD;  Location: [facility] ORS;  Service: Orthopedics;  Laterality: Right;  ? TONSILLECTOMY  1954  ? VULVECTOMY    ? VULVECTOMY PARTIAL N/A 09/29/2015  ? Procedure: VULVECTOMY  PARTIAL;  Surgeon: Bailey Ends, MD;  Location: [facility] ORS;  Service: Gynecology;  Laterality: N/A;  ? ? ?Allergies ? ?Allergies  ?Allergen Reactions  ? Morphine And Related Nausea And Vomiting and Nausea Only  ?  Hallucinations  ? Sulfa Antibiotics Other (See Comments)  ?  Dizzy/Fainting  ? Iodine Swelling  ?  IV iodine (states now that this can be tolerated with Benadryl)  ? Adhesive [Tape] Rash and Other (See Comments)  ?  Including Bandaids  ? Erythromycin Nausea And Vomiting  ? Oxycodone Nausea And Vomiting  ? ? ?History of Present Illness  ?  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  She has a history of hypertension dating back to at least 2008.  In the setting of an abnormal ECG, she underwent stress testing in June 2011, which was normal.  Though she used to exercise regularly, in 2016, she fell while exercising and tore her right rotator cuff.  She underwent right rotator cuff repair in July 2016 and after that never really got back into her exercise routine.  She subsequently gained 50 pounds and noted reduced activity tolerance.  Echocardiogram in December 2017 showed an EF of 60 to 65% with mild LVH, grade 1 diastolic dysfunction, and trivial MR.  She established care with our office in January 2018, at which time she reported elevated blood pressures at home and mild lower extremity swelling since being started on amlodipine.  She was placed on low-dose chlorthalidone and sleep study was advised though she deferred.  In March 2018, she was admitted for TIA.  CT and MRI were negative for stroke.  She underwent outpatient event monitoring, which did not show any significant arrhythmias. ? ?Bailey Brooks has been followed closely by primary care over the past few years.  More recently, her blood pressure has been managed with lisinopril as amlodipine was discontinued secondary to lower extremity swelling, and chlorthalidone was discontinued secondary to fatigue.  On January 06, 2022, she noted generalized malaise in the morning.  She cannot describe any specific symptoms.  She got into the shower and began feeling lightheaded/presyncopal.  There were no associated symptoms.  She felt like she might lose consciousness and so she stepped out of the shower and leaned on her sink using her left arm.  She called for her husband but then suddenly lost consciousness, slumping to the floor.  Her  husband had told her that she was without consciousness for about 30 seconds.  Upon regaining consciousness, she had no symptoms and denies any chest pain, dyspnea, palpitations, nausea, vomiting, or incontinence.  She says that she felt completely back to normal within a few minutes but decided to go to the emergency department just to be sure everything was okay.  In the emergency department, she was hemodynamically stable with normal ECG.  Labs notable for leukocytosis (14.1).  She was discharged home and subsequently follow-up with primary care with recommendation to follow-up with cardiology.  She has not had any recurrent presyncope or syncope since her ED visit.  She denies any prior history of palpitations, chest pain, or dyspnea.  Her activity is overall limited secondary to chronic low back pain, which resulted in her being quite sedentary.  She does not experience PND, orthopnea, edema, or early satiety.  In discussing her  recent syncopal episode further, she notes that she has a long history of passing out, typically occurring during spells of nausea and vomiting.  She also had an episode in 2014, during a stretch of constipation, resulting in significant straining on the toilet and syncope.  Syncopal spells are generally rare with years in between events. ? ?Home Medications  ?  ?Current Outpatient Medications  ?Medication Sig Dispense Refill  ? anastrozole (ARIMIDEX) 1 MG tablet TAKE 1 TABLET(1 MG) BY MOUTH DAILY 90 tablet 0  ? aspirin EC 81 MG tablet Take 81 mg by mouth daily.    ? atorvastatin (LIPITOR) 80 MG tablet TAKE 1 TABLET(80 MG) BY MOUTH DAILY 90 tablet 3  ? cholecalciferol (VITAMIN D3) 25 MCG (1000 UT) tablet Take 1,000 Units by mouth daily.    ? ezetimibe (ZETIA) 10 MG tablet TAKE 1 TABLET(10 MG) BY MOUTH DAILY 90 tablet 3  ? ketoconazole (NIZORAL) 2 % cream Apply twice daily as directed. 60 g 2  ? lansoprazole (PREVACID) 30 MG capsule Take 1 Capsule ('30MG'$ ) by mouth daily. 90 capsule 1  ?  levothyroxine (SYNTHROID) 88 MCG tablet Take 1 tablet (88 mcg total) by mouth every morning. on an empty stomach 90 tablet 3  ? lisinopril (ZESTRIL) 20 MG tablet TAKE 1 TABLET(20 MG) BY MOUTH DAILY 90 tablet

## 2022-03-08 ENCOUNTER — Ambulatory Visit (INDEPENDENT_AMBULATORY_CARE_PROVIDER_SITE_OTHER): Payer: Medicare Other

## 2022-03-08 ENCOUNTER — Inpatient Hospital Stay: Payer: Medicare Other

## 2022-03-08 DIAGNOSIS — R55 Syncope and collapse: Secondary | ICD-10-CM | POA: Diagnosis not present

## 2022-03-09 LAB — ECHOCARDIOGRAM COMPLETE
AR max vel: 2.12 cm2
AV Area VTI: 2.21 cm2
AV Area mean vel: 2.1 cm2
AV Mean grad: 9.4 mmHg
AV Peak grad: 16 mmHg
Ao pk vel: 2 m/s
Area-P 1/2: 3.5 cm2
Calc EF: 71.4 %
S' Lateral: 2.6 cm
Single Plane A2C EF: 71.6 %
Single Plane A4C EF: 70.1 %

## 2022-03-13 ENCOUNTER — Ambulatory Visit (INDEPENDENT_AMBULATORY_CARE_PROVIDER_SITE_OTHER): Payer: Medicare Other

## 2022-03-13 VITALS — Ht 63.0 in | Wt 241.0 lb

## 2022-03-13 DIAGNOSIS — Z0000 Encounter for general adult medical examination without abnormal findings: Secondary | ICD-10-CM | POA: Diagnosis not present

## 2022-03-13 NOTE — Patient Instructions (Addendum)
?  Bailey Brooks , ?Thank you for taking time to come for your Medicare Wellness Visit. I appreciate your ongoing commitment to your health goals. Please review the following plan we discussed and let me know if I can assist you in the future.  ? ?These are the goals we discussed: ? Goals   ? ?  DIET - Low carb/reduced sugar intake   ?  Healthy diet ?  ?  Increase physical activity   ?  Stretching and chair exercises. ?Weight loss goal 50lb. ?  ? ?  ?  ?This is a list of the screening recommended for you and due dates:  ?Health Maintenance  ?Topic Date Due  ? Zoster (Shingles) Vaccine (1 of 2) Never done  ? Flu Shot  07/04/2022  ? Tetanus Vaccine  10/11/2022  ? Mammogram  10/20/2022  ? Colon Cancer Screening  01/14/2031  ? Pneumonia Vaccine  Completed  ? DEXA scan (bone density measurement)  Completed  ? COVID-19 Vaccine  Completed  ? Hepatitis C Screening: USPSTF Recommendation to screen - Ages 57-79 yo.  Completed  ? HPV Vaccine  Aged Out  ?  ?

## 2022-03-13 NOTE — Progress Notes (Signed)
Subjective:   Bailey Brooks is a 71 y.o. female who presents for Medicare Annual (Subsequent) preventive examination.  Review of Systems    No ROS.  Medicare Wellness Virtual Visit.  Visual/audio telehealth visit, UTA vital signs.   See social history for additional risk factors.   Cardiac Risk Factors include: advanced age (>28men, >6 women);hypertension     Objective:    Today's Vitals   03/13/22 1123  Weight: 241 lb (109.3 kg)  Height: 5\' 3"  (1.6 m)   Body mass index is 42.69 kg/m.     03/13/2022   11:53 AM 01/13/2022    9:04 AM 01/06/2022    3:28 PM 10/05/2021   10:30 AM 04/04/2021   10:41 AM 03/10/2021   11:15 AM 02/23/2021    2:44 PM  Advanced Directives  Does Patient Have a Medical Advance Directive? Yes Yes Yes Yes No Yes No  Type of Estate agent of [place];Living will Healthcare Power of [place];Living will Living will Living will;Healthcare Power of [employer] Power of [place];Living will   Does patient want to make changes to medical advance directive? No - Patient declined No - Patient declined No - Patient declined   No - Patient declined   Copy of Healthcare Power of Attorney in Chart? No - copy requested No - copy requested    No - copy requested   Would patient like information on creating a medical advance directive?  No - Patient declined No - Patient declined  No - Patient declined  No - Patient declined   Current Medications (verified) Outpatient Encounter Medications as of 03/13/2022  Medication Sig   anastrozole (ARIMIDEX) 1 MG tablet TAKE 1 TABLET(1 MG) BY MOUTH DAILY   aspirin EC 81 MG tablet Take 81 mg by mouth daily.   atorvastatin (LIPITOR) 80 MG tablet TAKE 1 TABLET(80 MG) BY MOUTH DAILY   cholecalciferol (VITAMIN D3) 25 MCG (1000 UT) tablet Take 1,000 Units by mouth daily.   ezetimibe (ZETIA) 10 MG tablet TAKE 1 TABLET(10 MG) BY MOUTH DAILY   ketoconazole (NIZORAL) 2 % cream Apply twice daily as directed.    lansoprazole (PREVACID) 30 MG capsule Take 1 Capsule (30MG ) by mouth daily.   levothyroxine (SYNTHROID) 88 MCG tablet Take 1 tablet (88 mcg total) by mouth every morning. on an empty stomach   lisinopril (ZESTRIL) 20 MG tablet TAKE 1 TABLET(20 MG) BY MOUTH DAILY   mometasone (ELOCON) 0.1 % cream Apply to itchy spots on body 1-2 times until improved. Avoid face, groin, underarms.   Multiple Vitamin (MULTIVITAMIN) tablet Take 1 tablet by mouth daily.   naproxen (NAPROSYN) 500 MG tablet TAKE ONE TABLET BY MOUTH TWICE DAILY AS NEEDED FOR MODERATE PAIN. TAKE WITH FOOD.   Specialty Vitamins Products (ICAPS LUTEIN & ZEAXANTHIN PO) Take by mouth every morning. Zeaxanthin 4mg /Lutein 10mg    tacrolimus (PROTOPIC) 0.1 % ointment Apply 1-2 times daily as needed for itch to affected areas.   No facility-administered encounter medications on file as of 03/13/2022.   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 11/2016 Echo: EF 60-65%,  no rwma, mild LVH, Gr1 DD, triv MR, mildly dil LA, nl RV fxn.   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 Seward Meth s/p RCHOP and methotrexate, c/b renal failure   Diverticulosis    Esophagitis    Family history of breast cancer    Family history of melanoma    Family history of prostate cancer    GERD (gastroesophageal reflux disease)    H/O stem cell transplant (HCC) 06/2011   a. in setting of lymphoma.   Hemorrhoids    History of chemotherapy 22-Oct-2010   RHCOP/methotrexate-intrathecal   History of stress test    a. 05/2010 Myoview: nl EF, no ischemia/infarct.   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 2014 s/p resection.   Personal history of radiation therapy 2020-2021   right breast ca   Syncope    TIA (transient ischemic attack)    a. X 2 - last in 2018; b. 02/2017 event monitor: No significant arrhythmias.   Past Surgical History:  Procedure Laterality Date   ABDOMINAL HYSTERECTOMY  1985   Hysterectomy-partial   BREAST BIOPSY Right 2002   Neg - AT [facility]   BREAST BIOPSY Right 2020   bx done [facility]?, IDC and DCIS   BREAST LUMPECTOMY Right 10/16/2019   IDC and DCIS, negative LN   BURR HOLE W/ PLACEMENT OMMAYA RESERVOIR     BURR HOLE W/ PLACEMENT OMMAYA RESERVOIR  2012   COLONOSCOPY  04/2013   ESOPHAGOGASTRODUODENOSCOPY  04/2013   INJECTION KNEE  07/02/2018   INSERTION CENTRAL VENOUS ACCESS DEVICE W/ SUBCUTANEOUS PORT  2011   Port a Cath: Right chest Double Lumen, 04-Nov-2010   LIMBAL STEM CELL TRANSPLANT  2012   LIVER BIOPSY     stage 4B large Bcell lymphoma   PARTIAL MASTECTOMY WITH NEEDLE LOCALIZATION AND AXILLARY SENTINEL LYMPH NODE BX Right 10/16/2019   Procedure: PARTIAL MASTECTOMY WITH NEEDLE LOCALIZATION AND AXILLARY SENTINEL LYMPH NODE BX;  Surgeon: Sung Amabile, DO;  Location: [facility] ORS;  Service: General;  Laterality: Right;   ROTATOR CUFF REPAIR Right 2016   SHOULDER ARTHROSCOPY Right 06/22/2015   Procedure: ARTHROSCOPY SHOULDER, parital repair of rotator cuff, biceps tenodesis, decompression and debridement;  Surgeon: Christena Flake, MD;  Location: [facility] ORS;  Service: Orthopedics;  Laterality: Right;   TONSILLECTOMY  1954   VULVECTOMY     VULVECTOMY PARTIAL N/A 09/29/2015   Procedure: VULVECTOMY PARTIAL;  Surgeon: Artelia Laroche, MD;  Location: [facility] ORS;  Service: Gynecology;  Laterality: N/A;   Family History  Problem Relation Age of Onset   Diabetes Mother        died @ 37 of MI.   Hypertension Mother     Heart attack Mother    Melanoma Father 67       died of complications r/t melanoma w/ lung mets.   Kidney disease Sister        Kidney removed    Breast cancer Sister 35   Prostate cancer Brother        Prostate - dx in 71's   Heart disease Brother 78       reported MI @ age 52, ? treated w/ TPA->no recurrent CAD, now in 26's.   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  No pets.  Retired from [employer].  Activity limited by chronic back pain-sedentary.   Right-handed   Caffeine: occasional caffeine free/diet soda or hot tea   Social Determinants of Health   Financial Resource Strain: Low Risk    Difficulty of Paying Living Expenses: Not hard at all  Food Insecurity: No Food Insecurity   Worried About Programme researcher, broadcasting/film/video in the Last Year: Never true   Ran Out of Food in the Last Year: Never true  Transportation Needs: No Transportation Needs   Lack of Transportation (Medical): No   Lack of Transportation (Non-Medical): No  Physical Activity: Unknown   Days of Exercise per Week: 0 days    Minutes of Exercise per Session: Not on file  Stress: No Stress Concern Present   Feeling of Stress : Only a little  Social Connections: Unknown   Frequency of Communication with Friends and Family: Not on file   Frequency of Social Gatherings with Friends and Family: Not on file   Attends Religious Services: Not on Scientist, clinical (histocompatibility and immunogenetics) or Organizations: Not on file   Attends Banker Meetings: Not on file   Marital Status: Married    Tobacco Counseling Counseling given: Not Answered   Clinical Intake: Pre-visit preparation completed: Yes        Diabetes: No  How often do you need to have someone help you when you read instructions, pamphlets, or other written materials from your doctor or pharmacy?: 1 - Never  Interpreter Needed?: No    Activities of Daily Living    03/13/2022   11:32 AM  In your present state of health, do you have any difficulty performing the following activities:  Hearing? 0  Vision? 0  Difficulty concentrating or making decisions? 0  Walking or climbing stairs? 1  Brooks Unsteady gait at times. Paces self when walking.  Dressing or bathing? 0  Doing errands, shopping? 0  Preparing Food and eating ? N  Brooks Husband assist as needed with meal prep. Self feeds.  Using the Toilet? N  In the past six months, have you accidently leaked urine? N  Do you have problems with loss of bowel control? N  Managing your Medications? N  Managing your Finances? N  Housekeeping or managing your Housekeeping? Y  Brooks Maid assist   Patient Care Team: Glori Luis, MD as PCP - General (Family  Medicine) Iran Ouch, MD as PCP - Cardiology (Cardiology) Benita Gutter, RN as Registered Nurse  Indicate any recent Medical Services you may have received from other than [facility] providers in the past year (date may be approximate).     Assessment:   This is a routine wellness examination for [place].  Virtual Visit via  Telephone Note  I connected with  Bailey Brooks on 03/13/22 at 11:15 AM EDT by telephone and verified that I am speaking with the correct person using two identifiers.  Persons participating in the virtual visit: patient/Nurse Health Advisor   I discussed the limitations of performing an evaluation and management service by telehealth. The patient expressed understanding and agreed to proceed. We continued and completed visit with audio only. Some vital signs may be absent or patient reported.   Hearing/Vision screen Hearing Screening - Comments:: Patient is able to hear conversational tones without difficulty. No issues reported. Vision Screening - Comments:: Followed by [facility]  Wears corrective lenses  Dry macular degeneration They have seen their ophthalmologist in the last 12 months.   Dietary issues and exercise activities discussed: Current Exercise Habits: The patient does not participate in regular exercise at present Healthy diet Good water intake   Goals Addressed             This Visit's Progress    Increase physical activity       Stretching and chair exercises. Weight loss goal 50lb.       Depression Screen    03/13/2022   11:50 AM 01/03/2022   10:11 AM 07/29/2021   10:09 AM 03/10/2021   11:22 AM 01/28/2021    8:08 AM 08/02/2020    8:23 AM 03/24/2020    8:30 AM  PHQ 2/9 Scores  PHQ - 2 Score 0 0 0 0 0 0 0    Fall Risk    03/13/2022   11:27 AM 01/03/2022   10:11 AM 07/29/2021   10:09 AM 03/10/2021   11:22 AM 01/28/2021    8:08 AM  Fall Risk   Falls in the past year? 1 1 0 0 0  Brooks Last fall 12/2021.      Number falls in past yr: 1 0 0 0 0  Brooks Tripped when walking. Sought medical attention. Followed by PCP, Orthopedic and Cardiology.      Injury with Fall?  1 0 0   Risk for fall due to : History of fall(s) No Fall Risks     Follow up Falls evaluation completed Falls evaluation completed Falls evaluation completed Falls evaluation  completed Falls evaluation completed   FALL RISK PREVENTION PERTAINING TO THE HOME: Home free of loose throw rugs in walkways, pet beds, electrical cords, etc? Yes  Adequate lighting in your home to reduce risk of falls? Yes   ASSISTIVE DEVICES UTILIZED TO PREVENT FALLS: Life alert? No  Use of a cane, walker or w/c? No  Grab bars in the bathroom? Yes Shower chair or bench in shower? Yes  Elevated toilet seat or a handicapped toilet? No   TIMED UP AND GO: Was the test performed? No .   Cognitive Function:  Patient is alert and oriented x3.  Enjoys playing brain health stimulating games like [place]. Denies difficulty focusing, concentrating.       Immunizations Immunization History  Administered Date(s) Administered   Hepatitis B 10/11/2012   HiB (PRP-OMP) 10/11/2012   IPV 10/11/2012   Influenza Split 09/04/2011, 08/14/2012, 09/23/2014   Influenza,  High Dose Seasonal PF 09/21/2021   Influenza,inj,Quad PF,6+ Mos 08/16/2016, 08/20/2019   Influenza-Unspecified 09/25/2017, 09/24/2018, 08/18/2020   Moderna Covid-19 Vaccine Bivalent Booster 78yrs & up 09/30/2021   PFIZER(Purple Top)SARS-COV-2 Vaccination 01/30/2020, 02/24/2020, 07/25/2020   Pneumococcal Conjugate-13 12/20/2018   Pneumococcal Polysaccharide-23 03/07/2015   Tdap 10/06/2012   Shingrix Completed?: No.    Education has been provided regarding the importance of this vaccine. Patient has been advised to call insurance company to determine out of pocket expense if they have not yet received this vaccine. Advised may also receive vaccine at local pharmacy or Health Dept. Verbalized acceptance and understanding.  Screening Tests Health Maintenance  Topic Date Due   Zoster Vaccines- Shingrix (1 of 2) Never done   INFLUENZA VACCINE  07/04/2022   TETANUS/TDAP  10/11/2022   MAMMOGRAM  10/20/2022   COLONOSCOPY (Pts 45-26yrs Insurance coverage will need to be confirmed)  01/14/2031   Pneumonia Vaccine 66+ Years old   Completed   DEXA SCAN  Completed   COVID-19 Vaccine  Completed   Hepatitis C Screening  Completed   HPV VACCINES  Aged Out   Health Maintenance Health Maintenance Due  Topic Date Due   Zoster Vaccines- Shingrix (1 of 2) Never done   Lung Cancer Screening: (Low Dose CT Chest recommended if Age 22-80 years, 30 pack-year currently smoking OR have quit w/in 15years.) does not qualify.   Vision Screening: Recommended annual ophthalmology exams for early detection of glaucoma and other disorders of the eye.  Dental Screening: Recommended annual dental exams for proper oral hygiene. Visits every 6 months.   Community Resource Referral / Chronic Care Management: CRR required this visit?  No   CCM required this visit?  No      Plan:   Keep all routine maintenance appointments.   I have personally reviewed and noted the following in the patient's chart:   Medical and social history Use of alcohol, tobacco or illicit drugs  Current medications and supplements including opioid prescriptions.  Functional ability and status Nutritional status Physical activity Advanced directives List of other physicians Hospitalizations, surgeries, and ER visits in previous 12 months Vitals Screenings to include cognitive, depression, and falls Referrals and appointments  In addition, I have reviewed and discussed with patient certain preventive protocols, quality metrics, and best practice recommendations. A written personalized care plan for preventive services as well as general preventive health recommendations were provided to patient.     Ashok Pall, LPN   [id number]

## 2022-03-23 ENCOUNTER — Ambulatory Visit: Payer: Medicare Other | Admitting: Nurse Practitioner

## 2022-03-23 ENCOUNTER — Encounter: Payer: Self-pay | Admitting: Nurse Practitioner

## 2022-03-23 VITALS — HR 81 | Ht 63.0 in | Wt 243.2 lb

## 2022-03-23 DIAGNOSIS — R55 Syncope and collapse: Secondary | ICD-10-CM | POA: Diagnosis not present

## 2022-03-23 DIAGNOSIS — I10 Essential (primary) hypertension: Secondary | ICD-10-CM

## 2022-03-23 DIAGNOSIS — M7989 Other specified soft tissue disorders: Secondary | ICD-10-CM | POA: Diagnosis not present

## 2022-03-23 DIAGNOSIS — E782 Mixed hyperlipidemia: Secondary | ICD-10-CM

## 2022-03-23 MED ORDER — HYDROCHLOROTHIAZIDE 25 MG PO TABS: 12.5000 mg | ORAL_TABLET | ORAL | 3 refills | Status: DC | PRN

## 2022-03-23 MED ORDER — POTASSIUM CHLORIDE CRYS ER 20 MEQ PO TBCR: 20.0000 meq | EXTENDED_RELEASE_TABLET | ORAL | 1 refills | Status: DC | PRN

## 2022-03-23 NOTE — Progress Notes (Signed)
? ? ?Office Visit  ?  ?Patient Name: Bailey Brooks ?Date of Encounter: 03/23/2022 ? ?Primary Care Provider:  Leone Haven, MD ?Primary Cardiologist:  Kathlyn Sacramento, MD ? ?Chief Complaint  ?  ?71 year old female with history of hypertension, obesity, TIA, lymphoma status post chemotherapy and stem cell transplant, osteoarthritis, GERD, Paget's disease of the vulva status post partial vulvectomy, hypothyroidism, chronic fatigue, low back pain, and dyspnea, who presents for follow-up related to syncope. ? ?Past Medical History  ?  ?Past Medical History:  ?Diagnosis Date  ? Adult pulmonary Langerhans cell histiocytosis ([place])   ? Eosinophilic Granuloma of the Lung)  ? Allergic rhinitis   ? Anemia   ? WHILE GOING THRU CHEMO  ? Arthritis   ? Breast cancer ([place]) 10/2019  ? right breast ca  ? Diastolic dysfunction   ? a. 11/2016 Echo: EF 60-65%, no rwma, mild LVH, Gr1 DD, triv MR, mildly dil LA, nl RV fxn; b. 03/2022 Echo: EF 60-65%, no rwma, nl RV fxn, mild MR.  ? Diffuse large B cell lymphoma ([place]) 10/2010  ? Dr Inez Pilgrim, Dr. Madelynn Done s/p RCHOP and methotrexate, c/b renal failure  ? Diverticulosis   ? Esophagitis   ? Family history of breast cancer   ? Family history of melanoma   ? Family history of prostate cancer   ? GERD (gastroesophageal reflux disease)   ? H/O stem cell transplant ([place]) 06/2011  ? a. in setting of lymphoma.  ? Hemorrhoids   ? History of chemotherapy 22-Oct-2010  ? RHCOP/methotrexate-intrathecal  ? History of stress test   ? a. 05/2010 Myoview: nl EF, no ischemia/infarct.  ? Hypercholesterolemia   ? Hypertension   ? Hypothyroidism   ? Morbid obesity ([place])   ? Ommaya reservoir present   ? IN SCALP FROM 2012  ? Paget's disease of vulva ([place])   ? a. 2014 s/p resection.  ? Personal history of radiation therapy 2020-2021  ? right breast ca  ? Syncope   ? TIA (transient ischemic attack)   ? a. X 2 - last in 2018; b. 02/2017 event monitor: No significant arrhythmias.  ? ?Past Surgical History:   ?Procedure Laterality Date  ? ABDOMINAL HYSTERECTOMY  1985  ? Hysterectomy-partial  ? BREAST BIOPSY Right 2002  ? Neg - AT [facility]  ? BREAST BIOPSY Right 2020  ? bx done at [facility]?, IDC and DCIS  ? BREAST LUMPECTOMY Right 10/16/2019  ? IDC and DCIS, negative LN  ? BURR HOLE W/ PLACEMENT OMMAYA RESERVOIR    ? BURR HOLE W/ PLACEMENT OMMAYA RESERVOIR  2012  ? COLONOSCOPY  04/2013  ? ESOPHAGOGASTRODUODENOSCOPY  04/2013  ? INJECTION KNEE  07/02/2018  ? INSERTION CENTRAL VENOUS ACCESS DEVICE W/ SUBCUTANEOUS PORT  2011  ? Port a Cath: Right chest Double Lumen, 04-Nov-2010  ? LIMBAL STEM CELL TRANSPLANT  2012  ? LIVER BIOPSY    ? stage 4B large Bcell lymphoma  ? PARTIAL MASTECTOMY WITH NEEDLE LOCALIZATION AND AXILLARY SENTINEL LYMPH NODE BX Right 10/16/2019  ? Procedure: PARTIAL MASTECTOMY WITH NEEDLE LOCALIZATION AND AXILLARY SENTINEL LYMPH NODE BX;  Surgeon: Benjamine Sprague, DO;  Location: [facility] ORS;  Service: General;  Laterality: Right;  ? ROTATOR CUFF REPAIR Right 2016  ? SHOULDER ARTHROSCOPY Right 06/22/2015  ? Procedure: ARTHROSCOPY SHOULDER, parital repair of rotator cuff, biceps tenodesis, decompression and debridement;  Surgeon: Corky Mull, MD;  Location: [facility] ORS;  Service: Orthopedics;  Laterality: Right;  ? TONSILLECTOMY  1954  ? VULVECTOMY    ?  VULVECTOMY PARTIAL N/A 09/29/2015  ? Procedure: VULVECTOMY PARTIAL;  Surgeon: Gillis Ends, MD;  Location: [facility] ORS;  Service: Gynecology;  Laterality: N/A;  ? ? ?Allergies ? ?Allergies  ?Allergen Reactions  ? Morphine And Related Nausea And Vomiting and Nausea Only  ?  Hallucinations  ? Sulfa Antibiotics Other (See Comments)  ?  Dizzy/Fainting  ? Iodine Swelling  ?  IV iodine (states now that this can be tolerated with Benadryl)  ? Adhesive [Tape] Rash and Other (See Comments)  ?  Including Bandaids  ? Erythromycin Nausea And Vomiting  ? Oxycodone Nausea And Vomiting  ? ? ?History of Present Illness  ?  ?71 year old female with above past medical history including  hypertension, obesity, TIA, lymphoma status post chemotherapy and stem cell transplant, osteoarthritis, GERD, Paget's disease of the vulva status post partial vulvectomy, hypothyroidism, chronic fatigue, low back pain, and dyspnea.  She has a history of hypertension dating back to at least 2008.  In the setting of an abnormal ECG, she underwent stress testing in June 2011, which was normal.  She had significant weight gain following rotator cuff repair in July 2016, with subsequent inactivity.  An echocardiogram in December 2017 showed an EF of 60 to 65% with mild LVH, grade 1 diastolic dysfunction, and trivial MR.  In March 2018, she was admitted for TIA.  CT and MRI were negative for stroke.  She underwent outpatient event monitoring, which did not show any significant arrhythmias. ? ?In February 2023, she was experiencing generalized malaise and developed presyncope while showering.  She then stepped out of the shower and syncopized, slumping to the floor.  This was witnessed by her husband, and she was without consciousness for approximately 30 seconds.  Upon regaining consciousness, she was asymptomatic.  She went to the emergency department where she was hemodynamically stable and ECG was without acute findings.  Labs were notable for leukocytosis (14.1).  She was discharged home and reestablished care here on March 17.  At last visit, she reported a long history of presyncope and syncope, typically occurring during spells of nausea and vomiting, with at least one episode occurring in the setting of constipation and straining.  She had no recurrent symptoms following her February episode.  Symptoms were felt to be vasovagal in origin.  Echo was performed earlier this month showing an EF of 60 to 65% without regional wall motion abnormalities.  Mild mitral regurgitation was noted.  Since her last visit, she has done well without any recurrent presyncope or syncope.  She is not particularly active in the setting  of chronic back pain.  She does have chronic, stable dyspnea on exertion, especially at increased pace.  She does not experience chest pain and denies palpitations, PND, orthopnea, dizziness, syncope, or early satiety.  She notes that she previously was taking HCTZ 12.5 mg daily but this was stopped earlier this year in the setting of fatigue.  The fatigue has improved however, she has noted some swelling in her hands and wonders if she can go back on low-dose HCTZ at least on an as-needed basis. ? ?Home Medications  ?  ?Current Outpatient Medications  ?Medication Sig Dispense Refill  ? anastrozole (ARIMIDEX) 1 MG tablet TAKE 1 TABLET(1 MG) BY MOUTH DAILY 90 tablet 0  ? aspirin EC 81 MG tablet Take 81 mg by mouth daily.    ? atorvastatin (LIPITOR) 80 MG tablet TAKE 1 TABLET(80 MG) BY MOUTH DAILY 90 tablet 3  ? cholecalciferol (VITAMIN D3)  25 MCG (1000 UT) tablet Take 1,000 Units by mouth daily.    ? ezetimibe (ZETIA) 10 MG tablet TAKE 1 TABLET(10 MG) BY MOUTH DAILY 90 tablet 3  ? hydrochlorothiazide (HYDRODIURIL) 25 MG tablet Take 0.5 tablets (12.5 mg total) by mouth as needed (As needed for swelling). 90 tablet 3  ? ketoconazole (NIZORAL) 2 % cream Apply twice daily as directed. 60 g 2  ? lansoprazole (PREVACID) 30 MG capsule Take 1 Capsule ('30MG'$ ) by mouth daily. 90 capsule 1  ? levothyroxine (SYNTHROID) 88 MCG tablet Take 1 tablet (88 mcg total) by mouth every morning. on an empty stomach 90 tablet 3  ? lisinopril (ZESTRIL) 20 MG tablet TAKE 1 TABLET(20 MG) BY MOUTH DAILY 90 tablet 1  ? mometasone (ELOCON) 0.1 % cream Apply to itchy spots on body 1-2 times until improved. Avoid face, groin, underarms. 50 g 3  ? Multiple Vitamin (MULTIVITAMIN) tablet Take 1 tablet by mouth daily.    ? naproxen (NAPROSYN) 500 MG tablet TAKE ONE TABLET BY MOUTH TWICE DAILY AS NEEDED FOR MODERATE PAIN. TAKE WITH FOOD. 30 tablet 2  ? potassium chloride SA (KLOR-CON M) 20 MEQ tablet Take 1 tablet (20 mEq total) by mouth as needed (As  needed when you take HCTZ (hydrochlorothiazide)). 30 tablet 1  ? Specialty Vitamins Products (ICAPS LUTEIN & ZEAXANTHIN PO) Take by mouth every morning. Zeaxanthin '4mg'$ /Lutein '10mg'$     ? tacrolimus (PROTOPIC) 0

## 2022-03-23 NOTE — Patient Instructions (Signed)
Medication Instructions:  ?Your physician has recommended you make the following change in your medication:  ? ?TAKE HCTZ (hydrochlorothiazide) 12.5 mg once daily as needed for swelling ?TAKE Potassium 20 mEq as needed when you take HCTZ ? ?*If you need a refill on your cardiac medications before your next appointment, please call your pharmacy* ? ? ?Lab Work: ?BMET in 1-2 weeks over at the [place] and check in at registration.  ? ? ?If you have labs (blood work) drawn today and your tests are completely normal, you will receive your results only by: ?MyChart Message (if you have MyChart) OR ?A paper copy in the mail ?If you have any lab test that is abnormal or we need to change your treatment, we will call you to review the results. ? ? ?Testing/Procedures: ?None ? ? ?Follow-Up: ?At [facility], you and your health needs are our priority.  As part of our continuing mission to provide you with exceptional heart care, we have created designated Provider Care Teams.  These Care Teams include your primary Cardiologist (physician) and Advanced Practice Providers (APPs -  Physician Assistants and Nurse Practitioners) who all work together to provide you with the care you need, when you need it. ? ? ? ?Your next appointment:   ?4 month(s) ? ?The format for your next appointment:   ?In Person ? ?Provider:   ?Kathlyn Sacramento, MD ONLY ? ? ? ?Important Information About Sugar ? ? ? ? ?  ?

## 2022-03-29 ENCOUNTER — Inpatient Hospital Stay: Payer: Medicare Other | Attending: Hematology and Oncology | Admitting: Obstetrics and Gynecology

## 2022-03-29 VITALS — BP 157/97 | HR 86 | Temp 98.7°F | Resp 20 | Wt 244.1 lb

## 2022-03-29 DIAGNOSIS — Z853 Personal history of malignant neoplasm of breast: Secondary | ICD-10-CM | POA: Diagnosis not present

## 2022-03-29 DIAGNOSIS — Z8051 Family history of malignant neoplasm of kidney: Secondary | ICD-10-CM | POA: Diagnosis not present

## 2022-03-29 DIAGNOSIS — D071 Carcinoma in situ of vulva: Secondary | ICD-10-CM | POA: Diagnosis present

## 2022-03-29 DIAGNOSIS — M549 Dorsalgia, unspecified: Secondary | ICD-10-CM

## 2022-03-29 DIAGNOSIS — G8929 Other chronic pain: Secondary | ICD-10-CM

## 2022-03-29 DIAGNOSIS — Z170 Estrogen receptor positive status [ER+]: Secondary | ICD-10-CM | POA: Diagnosis not present

## 2022-03-29 DIAGNOSIS — M889 Osteitis deformans of unspecified bone: Secondary | ICD-10-CM | POA: Diagnosis not present

## 2022-03-29 DIAGNOSIS — C50911 Malignant neoplasm of unspecified site of right female breast: Secondary | ICD-10-CM | POA: Insufficient documentation

## 2022-03-29 DIAGNOSIS — Z803 Family history of malignant neoplasm of breast: Secondary | ICD-10-CM

## 2022-03-29 DIAGNOSIS — D701 Agranulocytosis secondary to cancer chemotherapy: Secondary | ICD-10-CM | POA: Insufficient documentation

## 2022-03-29 DIAGNOSIS — Z8042 Family history of malignant neoplasm of prostate: Secondary | ICD-10-CM

## 2022-03-29 DIAGNOSIS — Z90710 Acquired absence of both cervix and uterus: Secondary | ICD-10-CM

## 2022-03-29 DIAGNOSIS — Z79811 Long term (current) use of aromatase inhibitors: Secondary | ICD-10-CM | POA: Diagnosis not present

## 2022-03-29 DIAGNOSIS — C4499 Other specified malignant neoplasm of skin, unspecified: Secondary | ICD-10-CM

## 2022-03-29 DIAGNOSIS — I10 Essential (primary) hypertension: Secondary | ICD-10-CM | POA: Diagnosis not present

## 2022-03-29 DIAGNOSIS — Z808 Family history of malignant neoplasm of other organs or systems: Secondary | ICD-10-CM

## 2022-03-29 DIAGNOSIS — Z923 Personal history of irradiation: Secondary | ICD-10-CM | POA: Diagnosis not present

## 2022-03-29 DIAGNOSIS — Z800 Family history of malignant neoplasm of digestive organs: Secondary | ICD-10-CM | POA: Diagnosis not present

## 2022-03-29 NOTE — Progress Notes (Addendum)
Gynecologic Oncology Interval Visit  ? ?Referring Provider: Dr. Kenton Kingfisher ? ?Chief Concern: Recurrent paget's disease of the vulva ? ?Subjective:  ?Bailey Brooks is a 71 y.o. female, diagnosed with Paget's of the vulva, s/p WLE and vulvar biopsy 09/29/2015, who returns to clinic to re-evaluation.  ? ?She had recurrent pagets in 2021 and elected for imiquimod. She completed imiquimod treatment on 08/14/20. Biopsies post imiquimod were negative. We discussed maintenance imiquimod vs monitoring and she elected for surveillance. She returns today for follow up. She continues surveillance for history of breast cancer, currently on anastrozole. She has low back pain that has been impairing her mobility. She is seeing ortho for this. She had episodes of syncope and has been seen by cardiology for this. Today, she is asymptomatic and denies itching and/or vulvar pain. She has now retired from [employer].  ? ? ?Gynecologic Oncology History: ?[name] has a history of localized vulvar Paget's disease.  ? ?04/2013- vulvar biopsy revealed Paget's disease ?              WLE, additional margins resected for positive disease on frozen evaluation.  ? ?Part A: VULVA, VAGINAL MARGIN:  ?- POSITIVE FOR EXTRAMAMMARY PAGET'S DISEASE ? ?Part B: VULVA, POSTERIOR MARGIN:  ?- POSITIVE FOR EXTRAMAMMARY PAGET'S DISEASE ? ?Part C: VULVA, RIGHT, PARTIAL VULVECTOMY:  ?- EXTRAMAMMARY PAGET'S DISEASE ? ?Part D: VULVA, NEW VAGINAL MARGIN:  ?- POSITIVE FOR EXTRAMAMMARY PAGET'S DISEASE ? ?07/28/2015 for routine surveillance an area on the right vulva seemed more suspicious for recurrence. This was biopsied and confirmed recurrent Paget's disease.  ? ?She underwent repeat excision 09/29/15 ?A. VULVA; EXCISION:  ?- RARE CYTOKERATIN 7 POSITIVE INTRAEPIDERMAL CELLS, INTERPRETED AS RESIDUAL PAGET DISEASE.  ?- THE SURGICAL MARGINS ARE CLEAR.  ? ?B. VULVA, 11:00; BIOPSY:  ?- RARE CYTOKERATIN 7 POSITIVE INTRAEPIDERMAL CELLS, INTERPRETED AS PAGET DISEASE.  ? ?No  additional issues since surgery.  ? ?She had vulvar biopsy on 05/09/17 which was negative:   ?DIAGNOSIS:  ?A. VULVA; BIOPSY:  ?- SKIN WITH FOCAL KERATOSIS AND HYPERGRANULOSIS ?- NEGATIVE FOR DYSPLASIA AND MALIGNANCY ? ?Of note, she has had a h/o recurrent vulvar candidiasis. Prior to her vulvar surgery 09/2015 she received 5 weekly doses of oral diflucan for vulvar candidiasis. Postop she was treated for yeast infection again in 12/17 and then took one Diflucan a week for six months with good results.  ? ?Seen in gyn-onc clinic on 01/16/18 by Dr. Theora Gianotti. NED at that time.  ? ?She has swelling of her lower extremities which was evaluated by her PCP and thought to be related to amlodipine. She started lasix which has improved her symptoms. She was seen by Dr. 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  ? ?She saw Dr. Theora Gianotti on 01/29/2019 with symptoms concerning for candidiasis at that time but no evidence of recurrent disease.  ? ?Her blood pressure medications were recently changes due to swelling secondary to amlodipine. No on lisinopril.  ? ?Previous biopsies were consistent with recurrent Paget's.  ? ?DIAGNOSIS:  ?A. VULVA, LEFT AT 3:00; PUNCH BIOPSY:  ?- BENIGN VULVAR TISSUE.  ?- NEGATIVE FOR DYSPLASIA AND MALIGNANCY. ?  ?DIAGNOSIS:  ?A. VULVA, RIGHT AT 7:00; PUNCH BIOPSY:  ?- EXTRAMAMMARY PAGET'S DISEASE OF THE VULVA.  ?- NEGATIVE FOR DYSPLASIA AND MALIGNANCY.  ? ?In the interim she has completed surgery and radiation for stage I right breast cancer, ER/PR positive HER-2/neu negative. She completed radiation on 12/29/2019.  Anastrozole was recommended and she plans to start in approximately late February.  ? ?At last visit we had recommended start Aldara but due to recent diagnosis of breast cancer at that time, patient requested to wait to start treatment and returns today to discuss options for management and consider starting Aldara. She is concerned about  reaching site to be treated and tolerability. Her daughter-in-law is going to help her with application. Previously she had expressed desire to avoid surgery/Mohs.  ? ?01/16/20 started Imiquimod  completed 5 weeks of treatment then held 02/18/2020 due to ulceration, erythema, and pain. Restarted on 03/17/2020. We recommended to continue imiquimod for 16 weeks and add clobetasol, 0.05% cream to vulva on alternating days that she does not use imiquimod to help with inflammation and reduce risk of recurrent ulcer/skin breakdown. ?Completed treatment on 08/14/2020.  ? ?She completed imiquimod treatment on 08/14/20. At last appointment, post imiquimod, she underwent biopsy x 2 which were negative for Paget's. We discussed maintenance imiquimod vs monitoring. She elected close surveillance.  ? ?DIAGNOSIS:  ?A. VULVA, RIGHT; BIOPSY:  ?- BENIGN SQUAMOUS MUCOSA WITH MILD CHRONIC INFLAMMATION.  ?- NEGATIVE FOR DYSPLASIA AND MALIGNANCY.  ?- NEGATIVE FOR ACTIVE INFLAMMATION AND FUNGAL ELEMENTS.  ? ?B. VULVA, LEFT; BIOPSY:  ?- BENIGN SQUAMOUS MUCOSA WITH MILD CHRONIC INFLAMMATION.  ?- NEGATIVE FOR DYSPLASIA AND MALIGNANCY.  ?- NEGATIVE FOR ACTIVE INFLAMMATION AND FUNGAL ELEMENTS.  ? ?Comment:  ?The history of extramammary Paget's disease is noted. Immunohistochemical stain for CK7 (blocks A1 and B1) is negative.  ? ?Patient had vasovagal episode secondary to straining to have BM in September 2021 and was seen in ER. Concerned lidocaine for biopsy could have contributed. She was seen in ER and had negative head ct. Left frontal ventriculoperitoneal shunt was stable on imaging.  ? ? ?Problem List: ?Patient Active Problem List  ? Diagnosis Date Noted  ? Yeast vaginitis 02/06/2022  ? Upper respiratory infection 01/11/2022  ? Abnormal urinalysis 01/11/2022  ? Fall 01/03/2022  ? Left shoulder pain 01/03/2022  ? Syncope 09/03/2020  ? Low serum vitamin B12 05/28/2020  ? Fatigue 03/24/2020  ? Genetic testing 03/23/2020  ? Family history of  breast cancer   ? Family history of prostate cancer   ? Family history of melanoma   ? Carcinoma of upper-outer quadrant of right breast in female, estrogen receptor positive ([place]) 10/28/2019  ? Stress 08/06/2019  ? Breast cancer screening 08/06/2019  ? Pain due to onychomycosis of toenails of both feet 06/09/2019  ? Prediabetes 12/20/2018  ? Macular degeneration 06/20/2018  ? GERD (gastroesophageal reflux disease) 06/20/2018  ? Hypersomnia 06/20/2018  ? Chronic pain of right knee 06/20/2018  ? Bruising 12/14/2017  ? History of lymphoma 06/11/2017  ? Vulvar lesion 05/16/2017  ? Low back pain 04/03/2017  ? History of transient ischemic attack (TIA) 02/10/2017  ? Hypokalemia 02/10/2017  ? Left hip pain 08/03/2016  ? Essential hypertension 07/06/2016  ? Left knee pain 06/28/2016  ? Hypomagnesemia 03/02/2016  ? Morbid obesity due to excess calories ([place]) 03/02/2016  ? 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  ? Adjustment disorder with mixed anxiety and depressed mood 05/25/2015  ? 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 Paget disease, 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  ? Eczema 02/27/2013  ? Muscle cramps 11/13/2012  ? Hypothyroidism 02/20/2012  ? Hyperlipidemia 02/20/2012  ? ? ?Past Medical History: ?Past Medical History:  ?Diagnosis Date  ? 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 Eosinophilic Granuloma of the Lung)  ? Allergic rhinitis   ?  Anemia   ? WHILE GOING THRU CHEMO  ? Arthritis   ? Breast cancer ([place]) 10/2019  ? right breast ca  ? Diastolic dysfunction   ? a. 11/2016 Echo: EF 60-65%, no rwma, mild LVH, Gr1 DD, triv MR, mildly dil LA, nl RV fxn; b. 03/2022 Echo: EF 60-65%, no rwma, nl RV fxn, mild MR.  ? 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 Dr Inez Pilgrim, Dr. Madelynn Done s/p RCHOP and methotrexate, c/b renal failure  ? Diverticulosis   ? Esophagitis   ? Family history of breast cancer   ? Family history of melanoma   ? Family history of prostate cancer   ? GERD (gastroesophageal reflux disease)   ? 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 a. in setting of lymphoma.  ? Hemorrhoids   ? History of chemotherapy 22-Oct-2010  ? RHCOP/methotrexate-intrathecal  ? History of stress test   ? a. 05/2010 Myoview: nl E

## 2022-04-02 ENCOUNTER — Other Ambulatory Visit: Payer: Self-pay

## 2022-04-02 ENCOUNTER — Observation Stay: Payer: Medicare Other

## 2022-04-02 ENCOUNTER — Observation Stay
Admission: EM | Admit: 2022-04-02 | Discharge: 2022-04-03 | Disposition: A | Discharge status: Home / Self Care | Payer: Medicare Other | Attending: Internal Medicine | Admitting: Internal Medicine

## 2022-04-02 DIAGNOSIS — R2689 Other abnormalities of gait and mobility: Secondary | ICD-10-CM | POA: Insufficient documentation

## 2022-04-02 DIAGNOSIS — Z9481 Bone marrow transplant status: Secondary | ICD-10-CM | POA: Diagnosis not present

## 2022-04-02 DIAGNOSIS — Z7982 Long term (current) use of aspirin: Secondary | ICD-10-CM | POA: Insufficient documentation

## 2022-04-02 DIAGNOSIS — C50911 Malignant neoplasm of unspecified site of right female breast: Secondary | ICD-10-CM | POA: Insufficient documentation

## 2022-04-02 DIAGNOSIS — Z8673 Personal history of transient ischemic attack (TIA), and cerebral infarction without residual deficits: Secondary | ICD-10-CM | POA: Diagnosis present

## 2022-04-02 DIAGNOSIS — Z8572 Personal history of non-Hodgkin lymphomas: Secondary | ICD-10-CM | POA: Diagnosis not present

## 2022-04-02 DIAGNOSIS — I10 Essential (primary) hypertension: Secondary | ICD-10-CM | POA: Diagnosis not present

## 2022-04-02 DIAGNOSIS — Z7902 Long term (current) use of antithrombotics/antiplatelets: Secondary | ICD-10-CM | POA: Insufficient documentation

## 2022-04-02 DIAGNOSIS — E039 Hypothyroidism, unspecified: Secondary | ICD-10-CM | POA: Insufficient documentation

## 2022-04-02 DIAGNOSIS — R55 Syncope and collapse: Secondary | ICD-10-CM

## 2022-04-02 DIAGNOSIS — G459 Transient cerebral ischemic attack, unspecified: Principal | ICD-10-CM | POA: Insufficient documentation

## 2022-04-02 DIAGNOSIS — Z79899 Other long term (current) drug therapy: Secondary | ICD-10-CM | POA: Insufficient documentation

## 2022-04-02 DIAGNOSIS — R410 Disorientation, unspecified: Secondary | ICD-10-CM | POA: Diagnosis not present

## 2022-04-02 DIAGNOSIS — R531 Weakness: Secondary | ICD-10-CM | POA: Diagnosis not present

## 2022-04-02 DIAGNOSIS — Z743 Need for continuous supervision: Secondary | ICD-10-CM | POA: Diagnosis not present

## 2022-04-02 DIAGNOSIS — Z7901 Long term (current) use of anticoagulants: Secondary | ICD-10-CM | POA: Insufficient documentation

## 2022-04-02 DIAGNOSIS — R4182 Altered mental status, unspecified: Secondary | ICD-10-CM | POA: Diagnosis present

## 2022-04-02 DIAGNOSIS — R29818 Other symptoms and signs involving the nervous system: Secondary | ICD-10-CM | POA: Diagnosis not present

## 2022-04-02 LAB — CBC
HCT: 44.6 % (ref 36.0–46.0)
Hemoglobin: 14.2 g/dL (ref 12.0–15.0)
MCH: 27.9 pg (ref 26.0–34.0)
MCHC: 31.8 g/dL (ref 30.0–36.0)
MCV: 87.6 fL (ref 80.0–100.0)
Platelets: 197 10*3/uL (ref 150–400)
RBC: 5.09 MIL/uL (ref 3.87–5.11)
RDW: 14.1 % (ref 11.5–15.5)
WBC: 6.9 10*3/uL (ref 4.0–10.5)
nRBC: 0 % (ref 0.0–0.2)

## 2022-04-02 LAB — CBG MONITORING, ED: Glucose-Capillary: 92 mg/dL (ref 70–99)

## 2022-04-02 LAB — URINALYSIS, ROUTINE W REFLEX MICROSCOPIC
Bilirubin Urine: NEGATIVE
Glucose, UA: NEGATIVE mg/dL
Hgb urine dipstick: NEGATIVE
Ketones, ur: NEGATIVE mg/dL
Nitrite: NEGATIVE
Protein, ur: 30 mg/dL — AB
Specific Gravity, Urine: 1.027 (ref 1.005–1.030)
WBC, UA: >50 WBC/hpf — ABNORMAL HIGH (ref 0–5)
pH: 6 (ref 5.0–8.0)

## 2022-04-02 LAB — COMPREHENSIVE METABOLIC PANEL
ALT: 20 U/L (ref 0–44)
AST: 25 U/L (ref 15–41)
Albumin: 3.9 g/dL (ref 3.5–5.0)
Alkaline Phosphatase: 96 U/L (ref 38–126)
Anion gap: 7 (ref 5–15)
BUN: 22 mg/dL (ref 8–23)
CO2: 30 mmol/L (ref 22–32)
Calcium: 10 mg/dL (ref 8.9–10.3)
Chloride: 103 mmol/L (ref 98–111)
Creatinine, Ser: 0.9 mg/dL (ref 0.44–1.00)
GFR, Estimated: >60 mL/min (ref >60)
Glucose, Bld: 105 mg/dL — ABNORMAL HIGH (ref 70–99)
Potassium: 4 mmol/L (ref 3.5–5.1)
Sodium: 140 mmol/L (ref 135–145)
Total Bilirubin: 0.6 mg/dL (ref 0.3–1.2)
Total Protein: 7.4 g/dL (ref 6.5–8.1)

## 2022-04-02 LAB — TSH: TSH: 1.462 u[IU]/mL (ref 0.350–4.500)

## 2022-04-02 LAB — HEMOGLOBIN A1C
Hgb A1c MFr Bld: 5.5 % (ref 4.8–5.6)
Mean Plasma Glucose: 111.15 mg/dL

## 2022-04-02 LAB — LDL CHOLESTEROL, DIRECT: Direct LDL: 54.4 mg/dL (ref 0–99)

## 2022-04-02 MED ORDER — SENNOSIDES-DOCUSATE SODIUM 8.6-50 MG PO TABS: 1.0000 | ORAL_TABLET | Freq: Every evening | ORAL | Status: DC | PRN

## 2022-04-02 MED ORDER — DIAZEPAM 5 MG/ML IJ SOLN
5.0000 mg | INTRAMUSCULAR | Status: AC
  Administered 2022-04-02: 5 mg via INTRAVENOUS
  Filled 2022-04-02: qty 2

## 2022-04-02 MED ORDER — EZETIMIBE 10 MG PO TABS
10.0000 mg | ORAL_TABLET | Freq: Every day | ORAL | Status: DC
  Administered 2022-04-03: 10 mg via ORAL
  Filled 2022-04-02: qty 1

## 2022-04-02 MED ORDER — LEVOTHYROXINE SODIUM 88 MCG PO TABS
88.0000 ug | ORAL_TABLET | Freq: Every morning | ORAL | Status: DC
  Administered 2022-04-03: 88 ug via ORAL
  Filled 2022-04-02: qty 1

## 2022-04-02 MED ORDER — ACETAMINOPHEN 325 MG PO TABS
650.0000 mg | ORAL_TABLET | ORAL | Status: DC | PRN
  Administered 2022-04-03: 650 mg via ORAL
  Filled 2022-04-02: qty 2

## 2022-04-02 MED ORDER — ENOXAPARIN SODIUM 40 MG/0.4ML IJ SOSY
40.0000 mg | PREFILLED_SYRINGE | INTRAMUSCULAR | Status: DC
  Administered 2022-04-02: 40 mg via SUBCUTANEOUS
  Filled 2022-04-02: qty 0.4

## 2022-04-02 MED ORDER — ACETAMINOPHEN 650 MG RE SUPP: 650.0000 mg | RECTAL | Status: DC | PRN

## 2022-04-02 MED ORDER — STROKE: EARLY STAGES OF RECOVERY BOOK: Freq: Once | Status: AC

## 2022-04-02 MED ORDER — ANASTROZOLE 1 MG PO TABS
1.0000 mg | ORAL_TABLET | Freq: Every day | ORAL | Status: DC
Start: 2022-04-03 — End: 2022-04-03
  Administered 2022-04-03: 1 mg via ORAL
  Filled 2022-04-02: qty 1

## 2022-04-02 MED ORDER — ATORVASTATIN CALCIUM 20 MG PO TABS
80.0000 mg | ORAL_TABLET | Freq: Every day | ORAL | Status: DC
  Administered 2022-04-03: 80 mg via ORAL
  Filled 2022-04-02: qty 4

## 2022-04-02 MED ORDER — GADOBUTROL 1 MMOL/ML IV SOLN
7.5000 mL | Freq: Once | INTRAVENOUS | Status: AC | PRN
  Administered 2022-04-02: 7.5 mL via INTRAVENOUS

## 2022-04-02 MED ORDER — ACETAMINOPHEN 160 MG/5ML PO SOLN
650.0000 mg | ORAL | Status: DC | PRN
  Filled 2022-04-02: qty 20.3

## 2022-04-02 MED ORDER — PANTOPRAZOLE SODIUM 20 MG PO TBEC
20.0000 mg | DELAYED_RELEASE_TABLET | Freq: Every day | ORAL | Status: DC
  Administered 2022-04-03: 20 mg via ORAL
  Filled 2022-04-02: qty 1

## 2022-04-02 MED ORDER — CLOPIDOGREL BISULFATE 75 MG PO TABS
75.0000 mg | ORAL_TABLET | Freq: Every day | ORAL | Status: DC
  Administered 2022-04-02 – 2022-04-03 (×2): 75 mg via ORAL
  Filled 2022-04-02 (×2): qty 1

## 2022-04-02 MED ORDER — ASPIRIN 81 MG PO CHEW
81.0000 mg | CHEWABLE_TABLET | Freq: Every day | ORAL | Status: DC
  Administered 2022-04-03: 81 mg via ORAL
  Filled 2022-04-02: qty 1

## 2022-04-02 NOTE — H&P (Signed)
? ? ?History and Physical:  ? ? ?Bailey Brooks  ? ?YQM:[id number] DOB: 03-08-1951 DOA: 04/02/2022 ? ?Referring MD/provider: Dr. Kerman Passey ?PCP: Leone Haven, MD  ? ?Patient coming from: Home ? ?Chief Complaint: Difficulty with receptive and expressive communication for 45 minutes earlier charge, now resolved ? ?History of Present Illness:  ? ?Bailey Brooks is an 71 y.o. 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  She also noted that she could not see out of both of her eyes and had to turn her head in order to see the preacher.  She tried to speak with her husband however her husband said that she was not speaking incomprehensible language.  Patient remembers feeling confused but denies any dizziness or palpitations.  Both she and her husband note that symptoms lasted about 45 minutes and resolve spontaneously.  By the time EMS arrived most of the symptoms had resolved. ? ?At present patient feels that she is back to baseline.  Denies any difficulty with comprehension or with speech.  Patient's husband who is at bedside however notes that he thinks she is speaking somewhat more hesitantly than usual although she is very close to baseline.  Patient thinks that she has had word finding difficulties for a long time and that her somewhat more hesitant speech is because of this. ? ?ED Course:  The patient was seen by neurology who started patient on Plavix in addition to her usual aspirin.  MRI with and without contrast is ordered by them. ? ?ROS:  ? ?ROS  ? ?Review of Systems: ?General: Denies fever, chills, malaise,  ?Respiratory: Denies cough, SOB at rest or  hemoptysis ?Cardiovascular: Denies chest pain or palpitations ?GI: Denies nausea, vomiting, diarrhea or constipation ? ? ?Past Medical History:  ? ?Past Medical History:  ?Diagnosis Date  ? 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 Eosinophilic Granuloma of the Lung)  ? Allergic rhinitis   ? Anemia   ? WHILE GOING THRU CHEMO  ? Arthritis   ? Breast cancer ([place]) 10/2019  ? right breast ca  ? Diastolic dysfunction   ? a. 11/2016 Echo: EF 60-65%, no rwma, mild LVH, Gr1 DD, triv MR, mildly dil LA, nl RV fxn; b. 03/2022 Echo: EF 60-65%, no rwma, nl RV fxn, mild MR.  ? 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 Dr Inez Pilgrim, Dr. Madelynn Done s/p RCHOP and methotrexate, c/b renal failure  ? Diverticulosis   ? Esophagitis   ? Family history of breast cancer   ? Family history of melanoma   ? Family history of prostate cancer   ? GERD (gastroesophageal reflux disease)   ? H/O stem cell transplant ([place]) 06/2011  ? a. in setting of lymphoma.  ? Hemorrhoids   ? History of chemotherapy 22-Oct-2010  ? RHCOP/methotrexate-intrathecal  ? History of stress test   ? a. 05/2010 Myoview: nl EF, no ischemia/infarct.  ? Hypercholesterolemia   ? Hypertension   ? Hypothyroidism   ? Morbid obesity ([place])   ? Ommaya reservoir present   ? IN SCALP FROM 2012  ? Paget's disease of vulva ([place])   ? a. 2014 s/p resection.  ? Personal history of radiation  therapy 2020-2021  ? right breast ca  ? Syncope   ? TIA (transient ischemic attack)   ? a. X 2 - last in 2018; b. 02/2017 event monitor: No significant arrhythmias.  ? ? ?Past Surgical History:  ? ?Past Surgical History:  ?Procedure Laterality Date  ? ABDOMINAL HYSTERECTOMY  1985  ? Hysterectomy-partial  ? BREAST BIOPSY Right 2002  ? Neg - AT [facility]  ? BREAST BIOPSY Right 2020  ? bx done at [facility]?, IDC and DCIS  ? BREAST LUMPECTOMY Right 10/16/2019  ? IDC and DCIS, negative LN  ? BURR HOLE W/ PLACEMENT OMMAYA RESERVOIR    ? BURR HOLE W/ PLACEMENT OMMAYA RESERVOIR  2012  ? COLONOSCOPY   04/2013  ? ESOPHAGOGASTRODUODENOSCOPY  04/2013  ? INJECTION KNEE  07/02/2018  ? INSERTION CENTRAL VENOUS ACCESS DEVICE W/ SUBCUTANEOUS PORT  2011  ? Port a Cath: Right chest Double Lumen, 04-Nov-2010  ? LIMBAL STEM CELL TRANSPLANT  2012  ? LIVER BIOPSY    ? stage 4B large Bcell lymphoma  ? PARTIAL MASTECTOMY WITH NEEDLE LOCALIZATION AND AXILLARY SENTINEL LYMPH NODE BX Right 10/16/2019  ? Procedure: PARTIAL MASTECTOMY WITH NEEDLE LOCALIZATION AND AXILLARY SENTINEL LYMPH NODE BX;  Surgeon: Benjamine Sprague, DO;  Location: [facility] ORS;  Service: General;  Laterality: Right;  ? ROTATOR CUFF REPAIR Right 2016  ? SHOULDER ARTHROSCOPY Right 06/22/2015  ? Procedure: ARTHROSCOPY SHOULDER, parital repair of rotator cuff, biceps tenodesis, decompression and debridement;  Surgeon: Corky Mull, MD;  Location: [facility] ORS;  Service: Orthopedics;  Laterality: Right;  ? TONSILLECTOMY  1954  ? VULVECTOMY    ? VULVECTOMY PARTIAL N/A 09/29/2015  ? Procedure: VULVECTOMY PARTIAL;  Surgeon: Gillis Ends, MD;  Location: [facility] ORS;  Service: Gynecology;  Laterality: N/A;  ? ? ?Social History:  ? ?Social History  ? ?Socioeconomic History  ? Marital status: Married  ?  Spouse name: Not on file  ? Number of children: 2  ? Years of education: Not on file  ? Highest education level: Not on file  ?Occupational History  ? Occupation: Software engineer Records, [place]  ?  Employer: [employer]  ?Tobacco Use  ? Smoking status: Never  ? Smokeless tobacco: Never  ?Vaping Use  ? Vaping Use: Never used  ?Substance and Sexual Activity  ? Alcohol use: No  ?  Alcohol/week: 0.0 standard drinks  ? Drug use: No  ? Sexual activity: Not Currently  ?  Partners: Male  ?  Birth control/protection: Post-menopausal  ?Other Topics Concern  ? Not on file  ?Social History Narrative  ? Lives in [place] with husband, has 2 grown sons.  No pets.  Retired from [employer].  Activity limited by chronic back pain-sedentary.  ? Right-handed  ? Caffeine: occasional  caffeine free/diet soda or hot tea  ? ?Social Determinants of Health  ? ?Financial Resource Strain: Low Risk   ? Difficulty of Paying Living Expenses: Not hard at all  ?Food Insecurity: No Food Insecurity  ? Worried About Charity fundraiser in the Last Year: Never true  ? Ran Out of Food in the Last Year: Never true  ?Transportation Needs: No Transportation Needs  ? Lack of Transportation (Medical): No  ? Lack of Transportation (Non-Medical): No  ?Physical Activity: Not on file  ?Stress: No Stress Concern Present  ? Feeling of Stress : Only a little  ?Social Connections: Unknown  ? Frequency of Communication with Friends and Family: Not on file  ? Frequency of  Social Gatherings with Friends and Family: Not on file  ? Attends Religious Services: Not on file  ? Active Member of Clubs or Organizations: Not on file  ? Attends Archivist Meetings: Not on file  ? Marital Status: Married  ?Intimate Partner Violence: Not At Risk  ? Fear of Current or Ex-Partner: No  ? Emotionally Abused: No  ? Physically Abused: No  ? Sexually Abused: No  ? ? ?Allergies  ? ?Morphine and related, Sulfa antibiotics, Iodine, Adhesive [tape], Erythromycin, and Oxycodone ? ?Family history:  ? ?Family History  ?Problem Relation Age of Onset  ? Diabetes Mother   ?     died @ 35 of MI.  ? Hypertension Mother   ? Heart attack Mother   ? Melanoma Father 43  ?     died of complications r/t melanoma w/ lung mets.  ? Kidney disease Sister   ?     Kidney removed   ? Breast cancer Sister 48  ? Prostate cancer Brother   ?     Prostate - dx in 60's  ? Heart disease Brother 73  ?     reported MI @ age 24, ? treated w/ TPA->no recurrent CAD, now in 41's.  ? Hearing loss Maternal Aunt   ? Cancer Maternal Aunt   ?     pancreatic vs colon cancer  ? Hearing loss Maternal Grandfather   ? Other Maternal Grandmother   ?     flu pandemic  ? Kidney cancer Niece 50  ?     partial nephrectomy  ? ? ?Current Medications:  ? ?Prior to Admission medications    ?Medication Sig Start Date End Date Taking? Authorizing Provider  ?anastrozole (ARIMIDEX) 1 MG tablet TAKE 1 TABLET(1 MG) BY MOUTH DAILY 01/10/22   Cammie Sickle, MD  ?aspirin EC 81 MG tablet Take 81 mg by mouth daily.

## 2022-04-02 NOTE — ED Notes (Signed)
Pt taken to MRI. Pt drowsy after IV valium but SPO2 continued to be above 95% on RA. MRI will put in transport to bring pt to ready bed after MRI. Pt will go to room 108. IP RN informed. ?

## 2022-04-02 NOTE — ED Notes (Signed)
CBG- 92 

## 2022-04-02 NOTE — ED Notes (Signed)
Pt on phone with MRI screener. 

## 2022-04-02 NOTE — ED Notes (Signed)
Called MRI to check on pt. Pt doing well, still being scanned. ?

## 2022-04-02 NOTE — ED Provider Notes (Signed)
? ?[facility] ?Provider Note ? ? ? Event Date/Time  ? First MD Initiated Contact with Patient 04/02/22 1325   ?  (approximate) ? ?History  ? ?Chief Complaint: Altered Mental Status ? ?HPI ? ?Bailey Brooks is a 71 y.o. female with a past medical history of multiple TIAs, history of lymphoma has an intrathecal catheter, presents to the emergency department for confusion and difficulty speaking.  According to the patient approximately 1 prior to arrival patient was at church listening to a sermon.  Patient states she realized she could not make sense of what was being said and seemed to be very confused.  Husband who is here with the patient states she tried to tell him something but was unable to articulate her words and her words sounded slurred and she was having a lot of trouble finding the correct words.  This lasted for approximately 30 minutes and has been slowly resolving.  Currently patient feels like she is back to normal although during my evaluation she does continue to have episodes of word finding difficulty.  Patient states a past history of lymphoma in 2012 had an intrathecal catheter placed for methotrexate administration.  Catheter still in place although she has recovered from her lymphoma per patient.  Patient also states a history of Paget's disease.  Denies any weakness or numbness of any arm or leg at any point.  No headache. ? ?Physical Exam  ? ?Triage Vital Signs: ?ED Triage Vitals  ?Enc Vitals Group  ?   BP 04/02/22 1303 (!) 182/101  ?   Pulse Rate 04/02/22 1303 83  ?   Resp 04/02/22 1303 16  ?   Temp 04/02/22 1303 98 ?F (36.7 ?C)  ?   Temp Source 04/02/22 1303 Oral  ?   SpO2 04/02/22 1303 97 %  ?   Weight 04/02/22 1304 175 lb (79.4 kg)  ?   Height 04/02/22 1304 '5\' 3"'$  (1.6 m)  ?   Head Circumference --   ?   Peak Flow --   ?   Pain Score 04/02/22 1303 2  ?   Pain Loc --   ?   Pain Edu? --   ?   Excl. in [place]? --   ? ? ?Most recent vital signs: ?Vitals:  ? 04/02/22 1303   ?BP: (!) 182/101  ?Pulse: 83  ?Resp: 16  ?Temp: 98 ?F (36.7 ?C)  ?SpO2: 97%  ? ? ?General: Awake, no distress.  ?CV:  Good peripheral perfusion.  Regular rate and rhythm  ?Resp:  Normal effort.  Equal breath sounds bilaterally.  ?Abd:  No distention.  Soft, nontender.  No rebound or guarding. ?Other:  Equal grip strength bilaterally no pronator drift.  5/5 motor in all extremities.  No cranial nerve deficit. ? ? ?ED Results / Procedures / Treatments  ? ?EKG ? ?EKG viewed and interpreted by myself shows a normal sinus rhythm at 79 bpm with a narrow QRS, normal axis, normal intervals, no concerning ST changes. ? ?RADIOLOGY ? ?MRI pending ? ? ?MEDICATIONS ORDERED IN ED: ?Medications - No data to display ? ? ?IMPRESSION / MDM / ASSESSMENT AND PLAN / ED COURSE  ?I reviewed the triage vital signs and the nursing notes. ? ?Patient presents to the emergency department for word finding difficulty and confusion approximate hour prior to arrival.  Currently the patient appears well, no distress.  States this is happened multiple times in the past and she has been diagnosed with TIAs  in the past but negative work-ups per patient.  No anticoagulation besides aspirin.  Patient has no weakness or numbness of any arm or leg.  She does still on occasion have word finding difficulty although the patient states that is normal for her the husband believes that is somewhat increased over normal but still nothing like it was prior to arrival.  Patient's lab work is reassuring normal CBC chemistry negative urinalysis. ? ?I reviewed the patient's chart looks like her last discharge after a TIA was 02/11/2017 at that time had a negative MRI as well.  I spoke to Dr. Quinn Axe of neurology who recommends proceeding with MR with and without contrast to further evaluate.  Does recommend admitting to the hospital given her significant symptoms concerning for CVA/TIA versus metastatic disease given her history of cancer previously.  Patient agreeable  to plan of care. ? ?NIH Stroke Scale ? ? ?Interval: Baseline ?Time: 2:45 PM ?Person Administering Scale: Harvest Dark ? ?Administer stroke scale items in the order listed. Record performance in each category after each subscale exam. Do not go back and change scores. Follow directions provided for each exam technique. Scores should reflect what the patient does, not what the clinician thinks the patient can do. The clinician should record answers while administering the exam and work quickly. Except where indicated, the patient should not be coached (i.e., repeated requests to patient to make a special effort). ? ? ?1a  Level of consciousness: 0=alert; keenly responsive  ?1b. LOC questions:  0=Performs both tasks correctly  ?1c. LOC commands: 0=Performs both tasks correctly  ?2.  Best Gaze: 0=normal  ?3.  Visual: 0=No visual loss  ?4. Facial Palsy: 0=Normal symmetric movement  ?5a.  Motor left arm: 0=No drift, limb holds 90 (or 45) degrees for full 10 seconds  ?5b.  Motor right arm: 0=No drift, limb holds 90 (or 45) degrees for full 10 seconds  ?6a. motor left leg: 0=No drift, limb holds 90 (or 45) degrees for full 10 seconds  ?6b  Motor right leg:  0=No drift, limb holds 90 (or 45) degrees for full 10 seconds  ?7. Limb Ataxia: 0=Absent  ?8.  Sensory: 0=Normal; no sensory loss  ?9. Best Language:  1=Mild to moderate aphasia; some obvious loss of fluency or facility of comprehension without significant limitation on ideas expressed or form of expression.  ?10. Dysarthria: 0=Normal  ?11. Extinction and Inattention: 0=No abnormality  ?12. Distal motor function: 0=Normal  ? Total:   1  ? ? ?FINAL CLINICAL IMPRESSION(S) / ED DIAGNOSES  ? ?Transient ischemic attack ? ? ?Note:  This document was prepared using Dragon voice recognition software and may include unintentional dictation errors. ?  ?Harvest Dark, MD ?04/02/22 1446 ? ?

## 2022-04-02 NOTE — ED Triage Notes (Signed)
Pt in warm room at church and experienced confusion. Per pt she has a hx of htn and breast CA. Pt alert, disoriented to time, (year), now answering correctly. complaining of pain with IV insertion and BP.  ?Pt is crying and moaning. States she does know that she became confused in church. ?

## 2022-04-03 ENCOUNTER — Other Ambulatory Visit: Payer: Self-pay | Admitting: Respiratory Therapy

## 2022-04-03 ENCOUNTER — Observation Stay (HOSPITAL_BASED_OUTPATIENT_CLINIC_OR_DEPARTMENT_OTHER)
Admit: 2022-04-03 | Discharge: 2022-04-03 | Disposition: A | Discharge status: Home / Self Care | Payer: Medicare Other | Attending: Physician Assistant | Admitting: Physician Assistant

## 2022-04-03 DIAGNOSIS — R55 Syncope and collapse: Secondary | ICD-10-CM

## 2022-04-03 DIAGNOSIS — E785 Hyperlipidemia, unspecified: Secondary | ICD-10-CM

## 2022-04-03 DIAGNOSIS — I10 Essential (primary) hypertension: Secondary | ICD-10-CM | POA: Diagnosis not present

## 2022-04-03 DIAGNOSIS — G459 Transient cerebral ischemic attack, unspecified: Secondary | ICD-10-CM | POA: Diagnosis not present

## 2022-04-03 LAB — LIPID PANEL
Cholesterol: 101 mg/dL (ref 0–200)
HDL: 37 mg/dL — ABNORMAL LOW (ref >40)
LDL Cholesterol: 40 mg/dL (ref 0–99)
Total CHOL/HDL Ratio: 2.7 RATIO
Triglycerides: 119 mg/dL (ref <150)
VLDL: 24 mg/dL (ref 0–40)

## 2022-04-03 MED ORDER — CLOPIDOGREL BISULFATE 75 MG PO TABS: 75.0000 mg | ORAL_TABLET | Freq: Every day | ORAL | 0 refills | Status: AC

## 2022-04-03 NOTE — Progress Notes (Signed)
? ? ?  Zio patch recommended per neurology. Order completed and device to be applied by respiratory therapy. Do not discharge the patient until this has been applied. IM and RN updated via [place].   ?

## 2022-04-03 NOTE — Consult Note (Addendum)
Neurology Consultation  Reason for Consult: speech disturbance Referring Physician: Dr Marylu Lund  CC: Speech difficulty  History is obtained from: Patient, chart  HPI: Bailey Brooks is a 71 y.o. 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  This lasted for less than an hour.  She had no weakness tingling numbness.  No headaches. She has had prior TIAs where in 1 of those she was unable to bring out her words-she knew what she wanted to say but could not get her words out.  Followed with [facility] at 1 point but does not have a neurologist anymore. No prior history of seizures.  LKW: 1100 hrs tpa given?: no, symptoms resolved Premorbid modified Rankin scale (mRS): 0  ROS: Full ROS was performed and is negative except as noted in the HPI.   Past Medical History:  Diagnosis Date   Adult pulmonary Langerhans cell histiocytosis (HCC)    Eosinophilic Granuloma of the Lung)   Allergic rhinitis    Anemia    WHILE GOING THRU CHEMO   Arthritis    Breast cancer (HCC) 10/2019   right breast ca   Diastolic dysfunction    a. 11/2016 Echo: EF 60-65%, no rwma, mild LVH, Gr1 DD, triv MR, mildly dil LA, nl RV fxn; b. 03/2022 Echo: EF 60-65%, no rwma, nl RV fxn, mild MR.   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 Seward Meth s/p RCHOP and methotrexate, c/b renal failure   Diverticulosis    Esophagitis    Family history of breast cancer    Family history of melanoma    Family history of prostate cancer    GERD  (gastroesophageal reflux disease)    H/O stem cell transplant (HCC) 06/2011   a. in setting of lymphoma.   Hemorrhoids    History of chemotherapy 22-Oct-2010   RHCOP/methotrexate-intrathecal   History of stress test    a. 05/2010 Myoview: nl EF, no ischemia/infarct.   Hypercholesterolemia    Hypertension    Hypothyroidism    Morbid obesity (HCC)    Ommaya reservoir present    IN SCALP FROM 2012   Paget's disease of vulva (HCC)    a. 2014 s/p resection.   Personal history of radiation therapy 2020-2021   right breast ca   Syncope    TIA (transient ischemic attack)    a. X 2 - last in 2018; b. 02/2017 event monitor: No significant arrhythmias.   Family History  Problem Relation Age of Onset   Diabetes Mother        died @ 57 of MI.   Hypertension Mother    Heart attack Mother    Melanoma Father 58       died of complications r/t melanoma w/ lung mets.   Kidney disease Sister        Kidney removed    Breast cancer Sister 58   Prostate cancer Brother        Prostate - dx in 27's   Heart disease Brother 37       reported MI @ age  38, ? treated w/ TPA->no recurrent CAD, now in 70's.   Hearing loss Maternal Aunt    Cancer Maternal Aunt        pancreatic vs colon cancer   Hearing loss Maternal Grandfather    Other Maternal Grandmother        flu pandemic   Kidney cancer Niece 65       partial nephrectomy     Social History:   reports that she has never smoked. She has never used smokeless tobacco. She reports that she does not drink alcohol and does not use drugs.  Medications  Current Facility-Administered Medications:    acetaminophen (TYLENOL) tablet 650 mg, 650 mg, Oral, Q4H PRN, 650 mg at 04/03/22 0131 **OR** acetaminophen (TYLENOL) 160 MG/5ML solution 650 mg, 650 mg, Per Tube, Q4H PRN **OR** acetaminophen (TYLENOL) suppository 650 mg, 650 mg, Rectal, Q4H PRN, Leandro Reasoner Tublu, MD   anastrozole (ARIMIDEX) tablet 1 mg, 1 mg, Oral, Daily, Leandro Reasoner  Tublu, MD, 1 mg at 04/03/22 7829   aspirin chewable tablet 81 mg, 81 mg, Oral, Daily, Leandro Reasoner Tublu, MD, 81 mg at 04/03/22 0924   atorvastatin (LIPITOR) tablet 80 mg, 80 mg, Oral, Daily, Leandro Reasoner Tublu, MD, 80 mg at 04/03/22 5621   clopidogrel (PLAVIX) tablet 75 mg, 75 mg, Oral, Daily, Leandro Reasoner Tublu, MD, 75 mg at 04/03/22 0924   enoxaparin (LOVENOX) injection 40 mg, 40 mg, Subcutaneous, Q24H, Leandro Reasoner Tublu, MD, 40 mg at 04/02/22 2107   ezetimibe (ZETIA) tablet 10 mg, 10 mg, Oral, Daily, Leandro Reasoner Tublu, MD, 10 mg at 04/03/22 3086   levothyroxine (SYNTHROID) tablet 88 mcg, 88 mcg, Oral, q morning, Leandro Reasoner Tublu, MD, 88 mcg at 04/03/22 0532   pantoprazole (PROTONIX) EC tablet 20 mg, 20 mg, Oral, Daily, Leandro Reasoner Tublu, MD, 20 mg at 04/03/22 5784   senna-docusate (Senokot-S) tablet 1 tablet, 1 tablet, Oral, QHS PRN, Pieter Partridge, MD   Exam: Current vital signs: BP 134/73 (BP Location: Left Arm)   Pulse 75   Temp 98.6 F (37 C) (Oral)   Resp 14   Ht 5\' 3"  (1.6 m)   Wt 79.4 kg   SpO2 97%   BMI 31.00 kg/m  Vital signs in last 24 hours: Temp:  [98 F (36.7 C)-98.6 F (37 C)] 98.6 F (37 C) (05/01 0720) Pulse Rate:  [70-92] 75 (05/01 0720) Resp:  [12-20] 14 (05/01 0720) BP: (134-182)/(68-101) 134/73 (05/01 0720) SpO2:  [90 %-97 %] 97 % (05/01 0720) Weight:  [79.4 kg] 79.4 kg (04/30 1304) General: Awake alert in no distress HEENT: Normocephalic atraumatic Lungs: Clear Cardiovascular: Regular rate rhythm Abdomen nondistended nontender Extremities warm well perfused with some bruising in all 4 extremities Neurological exam Awake alert and oriented x3 No evidence of aphasia or dysarthria. Cranial nerves II to XII intact Motor examination with no drift in any of the 4 extremities and antigravity strength that is symmetric. Sensation intact light touch without extinction Coordination with no  evidence of dysmetria.  Gait testing was deferred - Stroke scale-0   Labs I have reviewed labs in epic and the results pertinent to this consultation are:   CBC    Component Value Date/Time   WBC 6.9 04/02/2022 1301   RBC 5.09 04/02/2022 1301   HGB 14.2 04/02/2022 1301   HGB 14.0 11/02/2014 0929   HCT 44.6 04/02/2022 1301   HCT 43.0 11/02/2014 0929   PLT 197 04/02/2022 1301   PLT 156 11/02/2014 0929  MCV 87.6 04/02/2022 1301   MCV 93 11/02/2014 0929   MCH 27.9 04/02/2022 1301   MCHC 31.8 04/02/2022 1301   RDW 14.1 04/02/2022 1301   RDW 14.7 (H) 11/02/2014 0929   LYMPHSABS 1.7 10/05/2021 0955   LYMPHSABS 1.1 11/02/2014 0929   MONOABS 0.4 10/05/2021 0955   MONOABS 0.3 11/02/2014 0929   EOSABS 0.2 10/05/2021 0955   EOSABS 0.0 11/02/2014 0929   BASOSABS 0.0 10/05/2021 0955   BASOSABS 0.0 11/02/2014 0929    CMP     Component Value Date/Time   NA 140 04/02/2022 1301   NA 138 09/03/2013 0809   K 4.0 04/02/2022 1301   K 4.3 11/02/2014 0932   CL 103 04/02/2022 1301   CL 105 09/03/2013 0809   CO2 30 04/02/2022 1301   CO2 28 09/03/2013 0809   GLUCOSE 105 (H) 04/02/2022 1301   GLUCOSE 100 (H) 09/03/2013 0809   BUN 22 04/02/2022 1301   BUN 17 09/03/2013 0809   CREATININE 0.90 04/02/2022 1301   CREATININE 0.96 11/02/2014 0929   CALCIUM 10.0 04/02/2022 1301   CALCIUM 9.1 09/03/2013 0809   PROT 7.4 04/02/2022 1301   PROT 6.6 11/02/2014 0929   ALBUMIN 3.9 04/02/2022 1301   ALBUMIN 3.3 (L) 11/02/2014 0929   AST 25 04/02/2022 1301   AST 16 11/02/2014 0929   ALT 20 04/02/2022 1301   ALT 19 11/02/2014 0929   ALKPHOS 96 04/02/2022 1301   ALKPHOS 87 11/02/2014 0929   BILITOT 0.6 04/02/2022 1301   BILITOT 0.3 11/02/2014 0929   GFRNONAA >60 04/02/2022 1301   GFRNONAA >60 11/02/2014 0929   GFRNONAA >60 07/27/2014 0836   GFRAA >60 07/05/2020 0923   GFRAA >60 11/02/2014 0929   GFRAA >60 07/27/2014 0836    Lipid Panel     Component Value Date/Time   CHOL 101  04/03/2022 0542   TRIG 119 04/03/2022 0542   HDL 37 (L) 04/03/2022 0542   CHOLHDL 2.7 04/03/2022 0542   VLDL 24 04/03/2022 0542   LDLCALC 40 04/03/2022 0542   LDLDIRECT 54.4 04/02/2022 1301     Imaging I have reviewed the images obtained:  CT-head-no acute changes   MRI examination of the brain: No acute infarction or hemorrhage.  No mass.  Left frontal approach catheter terminates in the left frontal horn.  There is gliosis along the catheter tract as well as probable prior right frontal catheter tract.  Additional patchy and confluent areas of T2 hyperintensity in the supratentorial and pontine white matter-nonspecific but might reflect chronic microvascular changes MR angiography of the head and neck with no LVO.  No significant stenosis.  Assessment: 71 year old with above past medical history presenting for evaluation of a less than an hour episode of speech problems-could not understand what her pastor was saying in church.  Symptoms resolved spontaneously. Has had previous episodes of TIAs with word finding difficulty and aphasia. Brain imaging and head and neck imaging reassuring from a stroke standpoint but there is prior catheter placement for chemotherapy. Current episode could be a TIA but an underlying seizure could also present in a similar way.  Low suspicion but given DSS and encephalomalacia on her brain imaging, should be considered as a possibility as an outpatient. She has had no personal history of seizures.  Stroke work-up 2D echocardiogram from April 2023-reviewed.  No acute changes. A1c 5.5.  Goal less than 7. LDL 40-goal less than 70.  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  Recommendations: -Dual antiplatelets for 3 weeks-aspirin 81 and Plavix 75 followed by aspirin 81 only. -On atorvastatin 80 at home-continue same dose high intensity  statin for now.  She is at goal LDL less than 70 -Her A1c is at goal -She would need outpatient long-term cardiac monitoring given history of syncope and recurrent TIAs. -No PT OT follow-up needed. -Blood pressure goal-normotension. -Follow-up outpatient cardiology in 4 to 6 weeks -Follow-up outpatient neurology in 4 to 6 weeks for TIA follow-up as well as outpatient EEG. Plan discussed with Dr. Marylu Lund over secure chat Plan also discussed in detail with the patient.  -- Milon Dikes, MD Neurologist [facility] Pager: [phone]

## 2022-04-03 NOTE — Evaluation (Signed)
Occupational Therapy Evaluation ?Patient Details ?Name: Bailey Brooks ?MRN: [id number] ?DOB: 1951-02-21 ?Today's Date: 04/03/2022 ? ? ?History of Present Illness 71 y.o. 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  She also noted that she could not see out of both of her eyes and had to turn her head in order to see the preacher.  She tried to speak with her husband however her husband said that she was not speaking incomprehensible language.  ? ?Clinical Impression ?  ?Pt was seen for OT evaluation this date. Prior to hospital admission, pt was mod indep with ADL/IADL, doesn't use AD for mobility, and endorses 2 falls in past 9mo Pt lives with her spouse in a 1 story home with R/L rails for her 4 front steps (garage has 3 steps, no rails). Pt received in recliner, no socks on, endorsing getting to the recliner herself this morning. Pt required MAX A to don her socks - she typically does not wear socks and wears slip on shoes due to difficulty reaching her feet at baseline (hx LBP as well). Otherwise, pt able to complete ADL tasks and mobility in the room with supervision/modified independence. She has tendency to reach out for fingertip touch on footboard while ambulating but did not demonstrate any LOB. Currently pt demonstrates mild impairments in balance and LBP as described below (See OT problem list) which functionally limit her ability to perform ADL/self-care tasks. No sensory, FMC, cognitive, visual, or unilateral strength deficits appreciated upon assessment. She as some AE at home (LH sponge). Pt educated in home/routines modifications, AE/DME for LB ADL, and falls prevention  strategies to minimize LBP and falls risk. Pt verbalized understanding. Do not anticipate skilled OT needs at this time. Will sign off.   ? ?Recommendations for follow up therapy are one component of a multi-disciplinary discharge planning process, led by the attending physician.  Recommendations may be updated based on patient status, additional functional criteria and insurance authorization.  ? ?Follow Up Recommendations ? No OT follow up  ?  ?Assistance Recommended at Discharge PRN  ?Patient can return home with the following A little help with bathing/dressing/bathroom;Assistance with cooking/housework;Help with stairs or ramp for entrance ? ?  ?Functional Status Assessment ? Patient has had a recent decline in their functional status and demonstrates the ability to make significant improvements in function in a reasonable and predictable amount of time.  ?Equipment Recommendations ?    ?  ?Recommendations for Other Services   ? ? ?  ?Precautions / Restrictions Precautions ?Precautions: Fall ?Restrictions ?Weight Bearing Restrictions: No  ? ?  ? ?Mobility Bed Mobility ?  ?  ?  ?  ?  ?  ?  ?General bed mobility comments: NT, up in recliner ?  ? ?Transfers ?Overall transfer level: Needs assistance ?Equipment used: None ?Transfers: Sit to/from Stand ?Sit to Stand: Supervision ?  ?  ?  ?  ?  ?  ?  ? ?  ?Balance Overall balance assessment: Needs assistance, History of Falls ?Sitting-balance support: Feet supported, No upper extremity supported ?Sitting balance-Leahy Scale: Normal ?  ?  ?Standing balance support: No upper extremity supported ?Standing balance-Leahy Scale: Fair ?Standing balance comment: tendency to reach out  for fingertip touch on footboard as she walks around the bed ?  ?  ?  ?  ?  ?  ?  ?  ?  ?  ?  ?   ? ?ADL either performed or assessed with clinical judgement  ? ?ADL Overall ADL's : Modified independent ?  ?  ?  ?  ?  ?  ?  ?  ?  ?  ?  ?  ?  ?  ?  ?  ?  ?  ?  ?General ADL Comments: Pt required  MAX A for donning socks 2/2 difficulty with low back issues and difficulty reaching; supervision for LB ADL otherwise, SBA for ADL mobility.  ? ? ? ?Vision   ?   ?   ?Perception   ?  ?Praxis   ?  ? ?Pertinent Vitals/Pain Pain Assessment ?Pain Assessment: No/denies pain  ? ? ? ?Hand Dominance Right ?  ?Extremity/Trunk Assessment Upper Extremity Assessment ?Upper Extremity Assessment: Generalized weakness (grossly 4/5 bilat, hx R RTC injury with decr shoulder flexion ROM ~100*, denies sensory deficits, FMC intact) ?  ?Lower Extremity Assessment ?Lower Extremity Assessment: Generalized weakness (grossly 4/5 bilat) ?  ?  ?  ?Communication Communication ?Communication: Other (comment) (very slight delay in word finding noted) ?  ?Cognition Arousal/Alertness: Awake/alert ?Behavior During Therapy: [facility] for tasks assessed/performed ?Overall Cognitive Status: Within Functional Limits for tasks assessed ?  ?  ?  ?  ?  ?  ?  ?  ?  ?  ?  ?  ?  ?  ?  ?  ?  ?  ?  ?General Comments    ? ?  ?Exercises Other Exercises ?Other Exercises: Pt educated in home/routines modifications, AE/DME for LB ADL, and falls prevention strategies to minimize LBP and falls risk ?  ?Shoulder Instructions    ? ? ?Home Living Family/patient expects to be discharged to:: Private residence ?Living Arrangements: Spouse/significant other;Other (Comment) (son and grandchildren staying temporarily) ?Available Help at Discharge: Family;Available 24 hours/day ?Type of Home: House ?Home Access: Stairs to enter ?Entrance Stairs-Number of Steps: 4 ?Entrance Stairs-Rails: Right;Left ?Home Layout: One level ?  ?  ?Bathroom Shower/Tub: Gaffer;Tub/shower unit ?  ?Bathroom Toilet: Standard ?  ?  ?Home Equipment: Shower seat - built in;Cane - single point;Grab bars - tub/shower ?  ?  ?  ? ?  ?Prior Functioning/Environment Prior Level of Function : Driving;History of Falls (last six months);Independent/Modified Independent ?  ?  ?  ?  ?  ?  ?Mobility Comments:  indep ?ADLs Comments: difficulty with LB ADL but able to do, doesn't wear socks, has slip on shoes, able to stand for ADL/IADL for ~21mn before LBP, reports hx of "fainting spells" and endorses 2 falls in past 66mopassed out after hot shower and fell carrying a heavy toy for her gkid) ?  ? ?  ?  ?OT Problem List: Impaired balance (sitting and/or standing);Decreased activity tolerance ?  ?   ?OT Treatment/Interventions:    ?  ?OT Goals(Current goals can be found in the care plan section) Acute Rehab OT Goals ?Patient Stated Goal: go home ?OT Goal Formulation: All assessment and education complete, DC therapy  ?OT Frequency:   ?  ? ?Co-evaluation   ?  ?  ?  ?  ? ?  ?AM-PAC OT "6 Clicks" Daily Activity     ?Outcome Measure Help from another person eating meals?: None ?Help from another person taking care of personal grooming?: None ?  Help from another person toileting, which includes using toliet, bedpan, or urinal?: None ?Help from another person bathing (including washing, rinsing, drying)?: None ?Help from another person to put on and taking off regular upper body clothing?: None ?Help from another person to put on and taking off regular lower body clothing?: A Little ?6 Click Score: 23 ?  ?End of Session   ? ?Activity Tolerance: Patient tolerated treatment well ?Patient left: in chair;with call bell/phone within reach ? ?OT Visit Diagnosis: Other abnormalities of gait and mobility (R26.89)  ?              ?Time: 6433-2951 ?OT Time Calculation (min): 23 min ?Charges:  OT General Charges ?$OT Visit: 1 Visit ?OT Evaluation ?$OT Eval Low Complexity: 1 Low ?OT Treatments ?$Self Care/Home Management : 8-22 mins ? ?Ardeth Perfect., MPH, MS, OTR/L ?ascom [phone] ?04/03/22, 10:43 AM ?

## 2022-04-03 NOTE — Progress Notes (Signed)
SLP Cancellation Note ? ?Patient Details ?Name: MCKYNLEE LUSE ?MRN: [id number] ?DOB: July 01, 1951 ? ? ?Cancelled treatment:       Reason Eval/Treat Not Completed: SLP screened, no needs identified, will sign off (chart reviewed; consulted NSG and met w/ pt in room.) ?Pt denied any difficulty swallowing and is currently on a regular diet; tolerates swallowing pills w/ water per NSG. Pt conversed in conversation w/out expressive/receptive deficits noted; pt denied any speech-language deficits. Speech clear. ?No further skilled ST services indicated as pt appears at her baseline. Pt agreed. NSG to reconsult if any change in status while admitted.   ? ? ? ? ? ? ?Orinda Kenner, MS, CCC-SLP ?Speech Language Pathologist ?Re[facility]; [place] ?[phone] (ascom) ?Bolivar Koranda ?04/03/2022, 7:55 AM ?

## 2022-04-03 NOTE — Progress Notes (Signed)
PT Cancellation Note ? ?Patient Details ?Name: Bailey Brooks ?MRN: [id number] ?DOB: 06-01-1951 ? ? ?Cancelled Treatment:    Reason Eval/Treat Not Completed: PT screened, no needs identified, will sign off. Orders received and chart reviewed. Pt independent with all mobility with transfers and ambulating in room without AD. Pt encouraged to ambulate in hallway to assess stair mobility but pt firmly declining despite encouragement. Pt at baseline level of mobility with no acute needs. PT to sign off. ? ? ?Salem Caster. Fairly IV, PT, DPT ?Physical Therapist- [place]  ?[facility]  ?04/03/2022, 10:19 AM ?

## 2022-04-03 NOTE — Discharge Summary (Signed)
Bailey Brooks FVC:[id number] DOB: 1951/08/29 DOA: 04/02/2022 ? ?PCP: Leone Haven, MD ? ?Admit date: 04/02/2022 ?Discharge date: 04/04/2022 ? ?Admitted From: home ?Disposition:  home ? ?Recommendations for Outpatient Follow-up:  ?Follow up with PCP in 1 week ?Please obtain BMP/CBC in one week ?Please follow up with cardiology in 2 weeks ?Follow up with neurology in 1-2 weeks ? ? ? ? ?Discharge Condition:Stable ?CODE STATUS:full  ?Diet recommendation: Heart Healthy  ?Brief/Interim Summary: ?Per HPI: Bailey Brooks is an 71 y.o. 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  She also noted that she could not see out of both of her eyes and had to turn her head in order to see the preacher.  She tried to speak with her husband however her husband said that she was not speaking incomprehensible language.  Patient remembers feeling confused but denies any dizziness or palpitations.  Both she and her husband note that symptoms lasted about 45 minutes and resolve spontaneously.  By the time EMS arrived most of the symptoms had resolved. ?  ?At present patient feels that she is back to baseline.  Denies any difficulty with comprehension or with speech.  Patient's husband who is at bedside however notes that he thinks she is speaking somewhat more hesitantly than usual although she is very close to baseline.  Patient thinks that she has had word finding difficulties for a long time and that her somewhat more hesitant speech is because of this. ?  ?ED Course:  The patient was seen by neurology who started patient on Plavix in addition to her usual aspirin.  PT/OT/SPL were consulted. ? ?MRI/MRA   was negative. Neurology consulted, recommended EEG as outpatient. Cardiology placed Zio patch prior to dc. Her symptoms improved and was stable for discharged. ? ? ? ?TIA ?Symptoms resolved  ?Neurology consulted-Low suspicion but needs outpatient evaluation for possible seizures given encephalomalacia/Kalosis from the catheter placement in her brain ?-Dual antiplatelets for 3 weeks-aspirin 81 and Plavix 75 followed by aspirin 81 only. ?-On atorvastatin 80 at home-continue same dose high intensity statin for now.  She is at goal LDL less than 70 ?-Her A1c is at goal ?Zio patch placed and f/u with cardiology as outpatient ?PT/OT no f/u needed ?SPL good  ?-Blood pressure goal-normotension. ?-Follow-up outpatient neurology in 4 to 6 weeks for TIA follow-up as well as outpatient EEG. ? ?  ?HTN ?Continue outpatient meds for nomotensive goals ?F/u with pcp as outpatient ?  ?HLD ?Continue statin ?Goal LDL <70 ?  ?Hypothyroidism ?Continue home synthroid ?TSH nml ? ?  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  ?Breast CA ?Diagnosed 2020, s/p lumpectomy and radiation ?Continue Arimidex ? ? ?Discharge Diagnoses:  ?Active Problems: ?  TIA (transient ischemic attack) ? ? ? ?Discharge Instructions ? ?Discharge Instructions   ? ? Call MD for:  difficulty breathing, headache or visual disturbances   Complete by: As directed ?  ? Diet - low sodium heart healthy   Complete by: As directed ?  ? Discharge instructions   Complete by: As directed ?  ? Follow up with cardiology ?F/u with neurology  Dr. Manuella Ghazi in [place] for EEG.  ? Increase activity slowly   Complete by: As directed ?  ? ?  ? ?Allergies as of 04/03/2022   ? ?   Reactions  ? Morphine And Related Nausea And Vomiting, Nausea Only  ? Hallucinations  ? Sulfa Antibiotics Other (See Comments)  ? Dizzy/Fainting  ? Iodine Swelling  ? IV iodine (states now that this can be tolerated with  Benadryl)  ? Adhesive [tape] Rash, Other (See Comments)  ? Including Bandaids  ? Erythromycin Nausea And Vomiting  ? Oxycodone Nausea And Vomiting  ? ?  ? ?  ?Medication List  ?  ? ?STOP taking these medications   ? ?naproxen 500 MG tablet ?Commonly known as: NAPROSYN ?  ? ?  ? ?TAKE these medications   ? ?anastrozole 1 MG tablet ?Commonly known as: ARIMIDEX ?TAKE 1 TABLET(1 MG) BY MOUTH DAILY ?  ?aspirin EC 81 MG tablet ?Take 81 mg by mouth daily. ?  ?atorvastatin 80 MG tablet ?Commonly known as: LIPITOR ?TAKE 1 TABLET(80 MG) BY MOUTH DAILY ?  ?cholecalciferol 25 MCG (1000 UNIT) tablet ?Commonly known as: VITAMIN D3 ?Take 1,000 Units by mouth daily. ?  ?clopidogrel 75 MG tablet ?Commonly known as: PLAVIX ?Take 1 tablet (75 mg total) by mouth daily for 21 days. ?  ?ezetimibe 10 MG tablet ?Commonly known as: ZETIA ?TAKE 1 TABLET(10 MG) BY MOUTH DAILY ?  ?hydrochlorothiazide 25 MG tablet ?Commonly known as: HYDRODIURIL ?Take 0.5 tablets (12.5 mg total) by mouth as needed (As needed for swelling). ?  ?ICAPS LUTEIN & ZEAXANTHIN PO ?Take by mouth every morning. Zeaxanthin '4mg'$ /Lutein '10mg'$  ?  ?ketoconazole 2 % cream ?Commonly known as: NIZORAL ?Apply twice daily as directed. ?  ?lansoprazole 30 MG capsule ?Commonly known as: PREVACID ?Take 1 Capsule ('30MG'$ ) by mouth daily. ?  ?levothyroxine 88 MCG tablet ?Commonly known as: SYNTHROID ?Take 1 tablet (88 mcg total) by mouth every morning. on an empty stomach ?  ?lisinopril 20 MG tablet ?Commonly known as: ZESTRIL ?TAKE 1 TABLET(20 MG) BY MOUTH DAILY ?  ?mometasone 0.1 % cream ?Commonly known as: ELOCON ?Apply to itchy spots on body 1-2 times until improved. Avoid face, groin, underarms. ?  ?multivitamin tablet ?Take 1 tablet by mouth daily. ?  ?potassium chloride SA 20 MEQ tablet ?Commonly known as: KLOR-CON M ?Take 1 tablet (20 mEq total) by mouth as needed (As needed when you take HCTZ (hydrochlorothiazide)). ?  ?tacrolimus 0.1 % ointment ?Commonly known as: PROTOPIC ?Apply  1-2 times daily as needed for itch to affected areas. ?  ? ?  ? ? Follow-up Information   ? ? Vladimir Crofts, MD. Go on 04/19/2022.   ?Specialty: Neurology ?Why: '@12'$ :30 w/ Dr. Luella Cook ?Contact information: ?[address] [facility] ?[place] [address] ?[phone] ? ? ?  ?  ? ?  ?  ? ?  ? ?Allergies  ?Allergen Reactions  ? Morphine And Related Nausea And Vomiting and Nausea Only  ?  Hallucinations  ? Sulfa Antibiotics Other (See Comments)  ?  Dizzy/Fainting  ? Iodine Swelling  ?  IV iodine (states now that this can be tolerated with Benadryl)  ? Adhesive [Tape] Rash and Other (See Comments)  ?  Including Bandaids  ? Erythromycin Nausea And Vomiting  ? Oxycodone Nausea And Vomiting  ? ? ?Consultations: ?neurology ? ? ?Procedures/Studies: ?MR ANGIO HEAD WO CONTRAST ? ?Result Date: 04/02/2022 ?CLINICAL DATA:  Carotid artery stenosis screening, risk factors; Transient ischemic  attack (TIA) Stroke, follow up Neuro deficit, acute, stroke suspected; Neuro deficit, acute, stroke suspected EXAM: MRI HEAD WITHOUT AND WITH CONTRAST MRA HEAD WITHOUT CONTRAST MRA NECK WITHOUT AND WITH CONTRAST TECHNIQUE: Multiplanar, multiecho pulse sequences of the brain and surrounding structures were obtained without and with intravenous contrast. Angiographic images of the Circle of Willis were obtained using MRA technique without intravenous contrast. Angiographic images of the neck were obtained using MRA technique without and with intravenous contrast. Carotid stenosis measurements (when applicable) are obtained utilizing NASCET criteria, using the distal internal carotid diameter as the denominator. CONTRAST:  7.44m GADAVIST GADOBUTROL 1 MMOL/ML IV SOLN COMPARISON:  March 2018 FINDINGS: MRI HEAD Brain: There is no acute infarction or intracranial hemorrhage. There is no intracranial mass, mass effect, or edema. There is no hydrocephalus or extra-axial fluid collection. Left frontal approach catheter  terminates in the left frontal horn. There is gliosis along the catheter tract as well as probable prior right frontal catheter tract. Additional patchy and confluent areas of T2 hyperintensity in the suprate

## 2022-04-04 DIAGNOSIS — R55 Syncope and collapse: Secondary | ICD-10-CM | POA: Diagnosis not present

## 2022-04-05 ENCOUNTER — Inpatient Hospital Stay (HOSPITAL_BASED_OUTPATIENT_CLINIC_OR_DEPARTMENT_OTHER): Payer: Medicare Other | Admitting: Internal Medicine

## 2022-04-05 ENCOUNTER — Inpatient Hospital Stay: Payer: Medicare Other | Attending: Internal Medicine

## 2022-04-05 DIAGNOSIS — Z801 Family history of malignant neoplasm of trachea, bronchus and lung: Secondary | ICD-10-CM | POA: Diagnosis not present

## 2022-04-05 DIAGNOSIS — Z8673 Personal history of transient ischemic attack (TIA), and cerebral infarction without residual deficits: Secondary | ICD-10-CM | POA: Insufficient documentation

## 2022-04-05 DIAGNOSIS — Z170 Estrogen receptor positive status [ER+]: Secondary | ICD-10-CM | POA: Insufficient documentation

## 2022-04-05 DIAGNOSIS — Z7902 Long term (current) use of antithrombotics/antiplatelets: Secondary | ICD-10-CM | POA: Insufficient documentation

## 2022-04-05 DIAGNOSIS — Z803 Family history of malignant neoplasm of breast: Secondary | ICD-10-CM | POA: Diagnosis not present

## 2022-04-05 DIAGNOSIS — I10 Essential (primary) hypertension: Secondary | ICD-10-CM | POA: Diagnosis not present

## 2022-04-05 DIAGNOSIS — C50411 Malignant neoplasm of upper-outer quadrant of right female breast: Secondary | ICD-10-CM | POA: Diagnosis not present

## 2022-04-05 DIAGNOSIS — Z800 Family history of malignant neoplasm of digestive organs: Secondary | ICD-10-CM | POA: Insufficient documentation

## 2022-04-05 DIAGNOSIS — Z90710 Acquired absence of both cervix and uterus: Secondary | ICD-10-CM | POA: Diagnosis not present

## 2022-04-05 DIAGNOSIS — M2550 Pain in unspecified joint: Secondary | ICD-10-CM | POA: Insufficient documentation

## 2022-04-05 DIAGNOSIS — Z8042 Family history of malignant neoplasm of prostate: Secondary | ICD-10-CM | POA: Insufficient documentation

## 2022-04-05 DIAGNOSIS — Z8051 Family history of malignant neoplasm of kidney: Secondary | ICD-10-CM | POA: Insufficient documentation

## 2022-04-05 DIAGNOSIS — Z8572 Personal history of non-Hodgkin lymphomas: Secondary | ICD-10-CM | POA: Diagnosis not present

## 2022-04-05 DIAGNOSIS — Z79811 Long term (current) use of aromatase inhibitors: Secondary | ICD-10-CM

## 2022-04-05 LAB — CBC WITH DIFFERENTIAL/PLATELET
Abs Immature Granulocytes: 0.02 10*3/uL (ref 0.00–0.07)
Basophils Absolute: 0 10*3/uL (ref 0.0–0.1)
Basophils Relative: 1 %
Eosinophils Absolute: 0.1 10*3/uL (ref 0.0–0.5)
Eosinophils Relative: 3 %
HCT: 42.8 % (ref 36.0–46.0)
Hemoglobin: 13.7 g/dL (ref 12.0–15.0)
Immature Granulocytes: 0 %
Lymphocytes Relative: 35 %
Lymphs Abs: 1.9 10*3/uL (ref 0.7–4.0)
MCH: 28.1 pg (ref 26.0–34.0)
MCHC: 32 g/dL (ref 30.0–36.0)
MCV: 87.9 fL (ref 80.0–100.0)
Monocytes Absolute: 0.5 10*3/uL (ref 0.1–1.0)
Monocytes Relative: 9 %
Neutro Abs: 3 10*3/uL (ref 1.7–7.7)
Neutrophils Relative %: 52 %
Platelets: 198 10*3/uL (ref 150–400)
RBC: 4.87 MIL/uL (ref 3.87–5.11)
RDW: 14 % (ref 11.5–15.5)
WBC: 5.6 10*3/uL (ref 4.0–10.5)
nRBC: 0 % (ref 0.0–0.2)

## 2022-04-05 LAB — COMPREHENSIVE METABOLIC PANEL
ALT: 20 U/L (ref 0–44)
AST: 24 U/L (ref 15–41)
Albumin: 3.7 g/dL (ref 3.5–5.0)
Alkaline Phosphatase: 91 U/L (ref 38–126)
Anion gap: 5 (ref 5–15)
BUN: 19 mg/dL (ref 8–23)
CO2: 28 mmol/L (ref 22–32)
Calcium: 9.2 mg/dL (ref 8.9–10.3)
Chloride: 105 mmol/L (ref 98–111)
Creatinine, Ser: 0.75 mg/dL (ref 0.44–1.00)
GFR, Estimated: >60 mL/min (ref >60)
Glucose, Bld: 108 mg/dL — ABNORMAL HIGH (ref 70–99)
Potassium: 3.9 mmol/L (ref 3.5–5.1)
Sodium: 138 mmol/L (ref 135–145)
Total Bilirubin: 0.7 mg/dL (ref 0.3–1.2)
Total Protein: 7.1 g/dL (ref 6.5–8.1)

## 2022-04-05 NOTE — Patient Instructions (Signed)
#  Stop anastrozole until next visit ? ?#Hold lisinopril for now.  Recheck blood pressure at home if systolic blood pressure greater than 120-can restart lisinopril.  Also keep a log of blood pressures/send it to PCP ?

## 2022-04-05 NOTE — Assessment & Plan Note (Addendum)
#  Right breast cancer stage I ER/PRPos; HER-2 negative. Currently on anastrozole- ;  STABLE; mammo- Nov 2021- WNL; awaiting this yeay- NOV 2022 [PCP]; ? ?# STOP Anastrazole given worsening MSK effects. Could consider- alternative at next visit.  Also discussed the potential cardiac effects of AI. But-given the benefits of AI from the risk of CAD.  ? ?# Borderline Low BP- Systolic-today 759. Recommend checking BP at home; and keep a log;forward/ bring to PCP. HOLD off lisinopril for now; can re-start if BP at home > 163 systolic.  Written instructions given. ? ?# Easy bruising:  likely sec to Asa+ plavix. Normal platelets; HOLD off PT/PTT for now.];  Will repeat at next visit. ? ?# ? TIA-on monitor [April 2023] on asprin +plavix- [easy bruising]; awaiting [facility] -neurology.  ? ?# joint aches- G-2-3; worse.  On naproxen-holding anastrozole as above.  ? ?# Bone density test-February 2023--normal limits continue calcium plus vitamin D. Discuss.  ? ?mychart ?# DISPOSITION: ?# Follow up in 1  months- MD; labs- PT/PTT; cbc--  Dr.B ? ?

## 2022-04-05 NOTE — Progress Notes (Signed)
Patient admitted overnight in hospital due to cardiac issues and is now wearing a Holter monitor.  Wondering the the Anastrozole could be the cause of cardiac issues.   ? ?Worsening mobility issues with low back and knee pain.   ? ?Unable to obtain BP today by manual or machine. ?

## 2022-04-05 NOTE — Progress Notes (Signed)
[place] ?OFFICE PROGRESS NOTE ? ?Patient Care Team: ?Leone Haven, MD as PCP - General (Family Medicine) ?Wellington Hampshire, MD as PCP - Cardiology (Cardiology) ?Clent Jacks, RN as Registered Nurse ? ? Cancer Staging  ?No matching staging information was found for the patient. ? ? ?Oncology History Overview Note  ?# RIGHT BREAST CANCER stage I [pTapN0; G-1; ER-100%; PR-99%; Her-2-NEG; Dr.Sakai]. NO Oncotype;s/p RT [12/29/2019] ? ?# MARCH 1st 2021- Anastrzole ?------------------------------------------------------------------------  ?# 2011- DLBCL with mesenteric mass & liver involvement s/p chemo- s/p Autotransplant [JUNE 2012; [facility]] s/p IT ? 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 ?# Shingles prophylaxis secondary- Valtrex 500 mg twice a day ? ?# SURVIVORSHIP-pending.  ? ?DIAGNOSIS: Right breast ? ?STAGE:   I      ;  GOALS: cure ? ?CURRENT/MOST RECENT THERAPY : anastrazole ?  ?History of lymphoma  ?Carcinoma of upper-outer quadrant of right breast in female, estrogen receptor positive ([place])  ?10/28/2019 Initial Diagnosis  ? Carcinoma of upper-outer quadrant of right breast in female, estrogen receptor positive ([place]) ?  ?03/20/2020 Genetic Testing  ? Negative genetic testing on the multi-cancer panel.  The Multi-Gene Panel offered by [facility] includes sequencing and/or deletion duplication testing of the following 85 genes: AIP, ALK, APC, ATM, AXIN2,BAP1,  BARD1, BLM, BMPR1A, BRCA1, BRCA2, BRIP1, CASR, CDC73, CDH1, CDK4, CDKN1B, CDKN1C, CDKN2A (p14ARF), CDKN2A (p16INK4a), CEBPA, CHEK2, CTNNA1, DICER1, DIS3L2, EGFR (c.2369C>T, p.Thr790Met variant only), EPCAM (Deletion/duplication testing only), FH, FLCN, GATA2, GPC3, GREM1 (Promoter region deletion/duplication testing only), HOXB13 (c.251G>A, p.Gly84Glu), HRAS, KIT, MAX, MEN1, MET, MITF (c.952G>A, p.Glu318Lys variant only), MLH1, MSH2, MSH3, MSH6, MUTYH, NBN, NF1, NF2, NTHL1, PALB2,  PDGFRA, PHOX2B, PMS2, POLD1, POLE, POT1, PRKAR1A, PTCH1, PTEN, RAD50, RAD51C, RAD51D, RB1, RECQL4, RET, RNF43, RUNX1, SDHAF2, SDHA (sequence changes only), SDHB, SDHC, SDHD, SMAD4, SMARCA4, SMARCB1, SMARCE1, STK11, SUFU, TERC, TERT, TMEM127, TP53, TSC1, TSC2, VHL, WRN and WT1.  The report date is March 20, 2020. ?  ? ?  ? ?INTERVAL HISTORY: Ambulating independently.  Accompanied by husband. ? ?Bailey Brooks 71 y.o.  female pleasant patient stage I ER/PR positive HER-2 negative breast cancer on anastrazole [macrh 2021] is here for follow-up. ? ?Patient admitted overnight in hospital due to possible TIA and is now wearing a Holter monitor.  Patient started on Plavix along with aspirin.  She complains of easy bruising.  ? ?Complains of worsening joint pains back pain.   No hot flashes.   ? ?Wondering the the Anastrozole could be the cause of cardiac issues.   ? ?Review of Systems  ?Constitutional:  Positive for malaise/fatigue. Negative for chills, diaphoresis and fever.  ?HENT:  Negative for nosebleeds and sore throat.   ?Eyes:  Negative for double vision.  ?Respiratory:  Negative for cough, hemoptysis, sputum production, shortness of breath and wheezing.   ?Cardiovascular:  Negative for chest pain, palpitations, orthopnea and leg swelling.  ?Gastrointestinal:  Negative for abdominal pain, blood in stool, constipation, diarrhea, heartburn, melena, nausea and vomiting.  ?Genitourinary:  Negative for dysuria, frequency and urgency.  ?Musculoskeletal:  Positive for back pain and joint pain.  ?Skin: Negative.  Negative for itching and rash.  ?Neurological:  Negative for dizziness, tingling, focal weakness, weakness and headaches.  ?Endo/Heme/Allergies:  Does not bruise/bleed easily.  ?Psychiatric/Behavioral:  Negative for depression. The patient is not nervous/anxious and does not have insomnia.   ?  ? ?PAST MEDICAL HISTORY :  ?Past Medical History:  ?Diagnosis Date  ??  Adult pulmonary Langerhans cell histiocytosis  ([place])   ? Eosinophilic Granuloma of the Lung)  ?? Allergic rhinitis   ?? Anemia   ? WHILE GOING THRU CHEMO  ?? Arthritis   ?? Breast cancer ([place]) 10/2019  ? right breast ca  ?? Diastolic dysfunction   ? a. 11/2016 Echo: EF 60-65%, no rwma, mild LVH, Gr1 DD, triv MR, mildly dil LA, nl RV fxn; b. 03/2022 Echo: EF 60-65%, no rwma, nl RV fxn, mild MR.  ?? Diffuse large B cell lymphoma ([place]) 10/2010  ? Dr Inez Pilgrim, Dr. Madelynn Done s/p RCHOP and methotrexate, c/b renal failure  ?? Diverticulosis   ?? Esophagitis   ?? Family history of breast cancer   ?? Family history of melanoma   ?? Family history of prostate cancer   ?? GERD (gastroesophageal reflux disease)   ?? H/O stem cell transplant ([place]) 06/2011  ? a. in setting of lymphoma.  ?? Hemorrhoids   ?? History of chemotherapy 22-Oct-2010  ? RHCOP/methotrexate-intrathecal  ?? History of stress test   ? a. 05/2010 Myoview: nl EF, no ischemia/infarct.  ?? Hypercholesterolemia   ?? Hypertension   ?? Hypothyroidism   ?? Morbid obesity ([place])   ?? 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 Paget's disease of vulva ([place])   ? a. 2014 s/p resection.  ?? Personal history of radiation therapy 2020-2021  ? right breast ca  ?? Syncope   ?? TIA (transient ischemic attack)   ? a. X 2 - last in 2018; b. 02/2017 event monitor: No significant arrhythmias.  ? ? ?PAST SURGICAL HISTORY :   ?Past Surgical History:  ?Procedure Laterality Date  ?? ABDOMINAL HYSTERECTOMY  1985  ? Hysterectomy-partial  ?? BREAST BIOPSY Right 2002  ? Neg - AT [facility]  ?? BREAST BIOPSY Right 2020  ? bx done at [facility]?, IDC and DCIS  ?? BREAST LUMPECTOMY Right 10/16/2019  ? IDC and DCIS, negative LN  ?? BURR HOLE W/ PLACEMENT OMMAYA RESERVOIR    ?? BURR HOLE W/ PLACEMENT OMMAYA RESERVOIR  2012  ?? COLONOSCOPY  04/2013  ?? ESOPHAGOGASTRODUODENOSCOPY  04/2013  ?? INJECTION KNEE  07/02/2018  ?? INSERTION CENTRAL VENOUS ACCESS DEVICE W/ SUBCUTANEOUS PORT  2011  ? Port a Cath: Right chest Double Lumen, 04-Nov-2010  ??  LIMBAL STEM CELL TRANSPLANT  2012  ?? LIVER BIOPSY    ? stage 4B large Bcell lymphoma  ?? PARTIAL MASTECTOMY WITH NEEDLE LOCALIZATION AND AXILLARY SENTINEL LYMPH NODE BX Right 10/16/2019  ? Procedure: PARTIAL MASTECTOMY WITH NEEDLE LOCALIZATION AND AXILLARY SENTINEL LYMPH NODE BX;  Surgeon: Benjamine Sprague, DO;  Location: [facility] ORS;  Service: General;  Laterality: Right;  ?? ROTATOR CUFF REPAIR Right 2016  ?? SHOULDER ARTHROSCOPY Right 06/22/2015  ? Procedure: ARTHROSCOPY SHOULDER, parital repair of rotator cuff, biceps tenodesis, decompression and debridement;  Surgeon: Corky Mull, MD;  Location: [facility] ORS;  Service: Orthopedics;  Laterality: Right;  ?? TONSILLECTOMY  1954  ?? VULVECTOMY    ?? VULVECTOMY PARTIAL N/A 09/29/2015  ? Procedure: VULVECTOMY PARTIAL;  Surgeon: Gillis Ends, MD;  Location: [facility] ORS;  Service: Gynecology;  Laterality: N/A;  ? ? ?FAMILY HISTORY :   ?Family History  ?Problem Relation Age of Onset  ?? Diabetes Mother   ?     died @ 54 of MI.  ?? Hypertension Mother   ?? Heart attack Mother   ?? Melanoma Father 6  ?     died of complications r/t  melanoma w/ lung mets.  ?? Kidney disease Sister   ?     Kidney removed   ?? Breast cancer Sister 8  ?? Prostate cancer Brother   ?     Prostate - dx in 31's  ?? Heart disease Brother 21  ?     reported MI @ age 17, ? treated w/ TPA->no recurrent CAD, now in 81's.  ?? Hearing loss Maternal Aunt   ?? Cancer Maternal Aunt   ?     pancreatic vs colon cancer  ?? Hearing loss Maternal Grandfather   ?? Other Maternal Grandmother   ?     flu pandemic  ?? Kidney cancer Niece 27  ?     partial nephrectomy  ? ? ?SOCIAL HISTORY:   ?Social History  ? ?Tobacco Use  ?? Smoking status: Never  ?? Smokeless tobacco: Never  ?Vaping Use  ?? Vaping Use: Never used  ?Substance Use Topics  ?? Alcohol use: No  ?  Alcohol/week: 0.0 standard drinks  ?? Drug use: No  ? ? ?ALLERGIES:  is allergic to morphine and related, sulfa antibiotics, iodine, adhesive [tape],  erythromycin, and oxycodone. ? ?MEDICATIONS:  ?Current Outpatient Medications  ?Medication Sig Dispense Refill  ?? anastrozole (ARIMIDEX) 1 MG tablet TAKE 1 TABLET(1 MG) BY MOUTH DAILY 90 tablet 0  ?? aspirin EC 81

## 2022-04-07 ENCOUNTER — Encounter: Payer: Self-pay | Admitting: Family Medicine

## 2022-04-07 ENCOUNTER — Ambulatory Visit (INDEPENDENT_AMBULATORY_CARE_PROVIDER_SITE_OTHER): Payer: Medicare Other | Admitting: Family Medicine

## 2022-04-07 DIAGNOSIS — G459 Transient cerebral ischemic attack, unspecified: Secondary | ICD-10-CM | POA: Diagnosis not present

## 2022-04-07 DIAGNOSIS — M545 Low back pain, unspecified: Secondary | ICD-10-CM

## 2022-04-07 DIAGNOSIS — I10 Essential (primary) hypertension: Secondary | ICD-10-CM

## 2022-04-07 DIAGNOSIS — E785 Hyperlipidemia, unspecified: Secondary | ICD-10-CM | POA: Diagnosis not present

## 2022-04-07 MED ORDER — LISINOPRIL 40 MG PO TABS: 40.0000 mg | ORAL_TABLET | Freq: Every day | ORAL | 3 refills | Status: DC

## 2022-04-07 NOTE — Progress Notes (Signed)
?[name], MD ?Phone: [phone] ? ?Bailey Brooks is a 71 y.o. female who presents today for f/u. ? ?TIA: Patient had an episode at church where she was not getting out the appropriate words when she tried to speak.  She also trouble understanding the preacher.  This lasted a brief period of time.  She was evaluated in the hospital had a negative MRI and MRA head and neck.  Neurology recommended 3 weeks of aspirin and Plavix followed by aspirin ongoing.  They recommended outpatient follow-up for EEG.  Cardiology placed a ZIO monitor.  She has had no recurrence of symptoms.  LDL was well controlled in the hospital.  She has cardiology and neurology follow-up scheduled. ? ?Hypertension: Patient gets blood pressures at home that range from 135-156/67-76.  She has been taking lisinopril 20 mg once daily.  She has no chest pain or shortness of breath.  The patient notes it seems as though everyone has a hard time hearing her blood pressure when they take it manually. ? ?Chronic back pain: Patient has stopped anastrozole at the advice of her oncologist.  She notes she has already felt a difference in her pain decreasing.  She has follow-up with oncology to discuss other treatment options in June.  She also has follow-up with orthopedics later this month. ? ?Social History  ? ?Tobacco Use  ?Smoking Status Never  ?Smokeless Tobacco Never  ? ? ?Current Outpatient Medications on File Prior to Visit  ?Medication Sig Dispense Refill  ? anastrozole (ARIMIDEX) 1 MG tablet TAKE 1 TABLET(1 MG) BY MOUTH DAILY 90 tablet 0  ? aspirin EC 81 MG tablet Take 81 mg by mouth daily.    ? atorvastatin (LIPITOR) 80 MG tablet TAKE 1 TABLET(80 MG) BY MOUTH DAILY 90 tablet 3  ? cholecalciferol (VITAMIN D3) 25 MCG (1000 UT) tablet Take 1,000 Units by mouth daily.    ? clopidogrel (PLAVIX) 75 MG tablet Take 1 tablet (75 mg total) by mouth daily for 21 days. 21 tablet 0  ? ezetimibe (ZETIA) 10 MG tablet TAKE 1 TABLET(10 MG) BY MOUTH  DAILY 90 tablet 3  ? ketoconazole (NIZORAL) 2 % cream Apply twice daily as directed. 60 g 2  ? lansoprazole (PREVACID) 30 MG capsule Take 1 Capsule ('30MG'$ ) by mouth daily. 90 capsule 1  ? levothyroxine (SYNTHROID) 88 MCG tablet Take 1 tablet (88 mcg total) by mouth every morning. on an empty stomach 90 tablet 3  ? mometasone (ELOCON) 0.1 % cream Apply to itchy spots on body 1-2 times until improved. Avoid face, groin, underarms. 50 g 3  ? Multiple Vitamin (MULTIVITAMIN) tablet Take 1 tablet by mouth daily.    ? potassium chloride SA (KLOR-CON M) 20 MEQ tablet Take 1 tablet (20 mEq total) by mouth as needed (As needed when you take HCTZ (hydrochlorothiazide)). 30 tablet 1  ? Specialty Vitamins Products (ICAPS LUTEIN & ZEAXANTHIN PO) Take by mouth every morning. Zeaxanthin '4mg'$ /Lutein '10mg'$     ? tacrolimus (PROTOPIC) 0.1 % ointment Apply 1-2 times daily as needed for itch to affected areas. 60 g 2  ? ?No current facility-administered medications on file prior to visit.  ? ? ? ?ROS see history of present illness ? ?Objective ? ?Physical Exam ?Vitals:  ? 04/07/22 1005  ?BP: (!) 177/85  ?Pulse: 83  ?Temp: 98.5 ?F (36.9 ?C)  ?SpO2: 97%  ? ? ?BP Readings from Last 3 Encounters:  ?04/07/22 (!) 177/85  ?04/03/22 134/73  ?03/29/22 (!) 157/97  ? ?Wt Readings  from Last 3 Encounters:  ?04/07/22 240 lb 9.6 oz (109.1 kg)  ?04/05/22 239 lb 3.2 oz (108.5 kg)  ?04/02/22 175 lb (79.4 kg)  ? ? ?Physical Exam ?Constitutional:   ?   General: She is not in acute distress. ?   Appearance: She is not diaphoretic.  ?Cardiovascular:  ?   Rate and Rhythm: Normal rate and regular rhythm.  ?   Heart sounds: Normal heart sounds.  ?Pulmonary:  ?   Effort: Pulmonary effort is normal.  ?   Breath sounds: Normal breath sounds.  ?Skin: ?   General: Skin is warm and dry.  ?Neurological:  ?   Mental Status: She is alert.  ?   Comments: CN 3-12 intact, 5/5 strength in bilateral biceps, triceps, grip, quads, hamstrings, plantar and dorsiflexion, sensation to  light touch intact in bilateral UE and LE  ? ? ? ?Assessment/Plan: Please see individual problem list. ? ?Problem List Items Addressed This Visit   ? ? Essential hypertension (Chronic)  ?  Above goal.  We will increase her lisinopril to 40 mg once daily.  She will return in 1 week for labs and a nurse blood pressure check.  I did discuss the potential for adding an additional blood pressure medication.  She will continue to monitor this at home. ? ?  ?  ? Relevant Medications  ? lisinopril (ZESTRIL) 40 MG tablet  ? Other Relevant Orders  ? Basic Metabolic Panel (BMET)  ? Hyperlipidemia (Chronic)  ?  Well-controlled.  She will continue Lipitor 80 mg once daily and Zetia 10 mg daily. ? ?  ?  ? Relevant Medications  ? lisinopril (ZESTRIL) 40 MG tablet  ? Low back pain (Chronic)  ?  Possibly related to her anastrozole.  She will see how she does over the next several weeks and if her back pain is not progressively improving she will see orthopedics as planned.  She will follow-up with her oncologist as planned as well. ? ?  ?  ? TIA (transient ischemic attack)  ?  Patient with a likely TIA though she needs to see neurology for an EEG to rule out underlying seizure.  She will remain on aspirin and Plavix for total of 3 weeks.  After that she will take just aspirin 81 mg daily.  She will keep her neurology follow-up.  She will keep her cardiology follow-up.  She will seek medical attention for recurrence of symptoms. ? ?  ?  ? Relevant Medications  ? lisinopril (ZESTRIL) 40 MG tablet  ? ? ? ?Return in about 1 week (around 04/14/2022) for Labs, nurse BP check, 3 months PCP. ? ? ?[name], MD ?[place] ? ?

## 2022-04-07 NOTE — Assessment & Plan Note (Signed)
Possibly related to her anastrozole.  She will see how she does over the next several weeks and if her back pain is not progressively improving she will see orthopedics as planned.  She will follow-up with her oncologist as planned as well. ?

## 2022-04-07 NOTE — Assessment & Plan Note (Signed)
Well-controlled.  She will continue Lipitor 80 mg once daily and Zetia 10 mg daily. ?

## 2022-04-07 NOTE — Assessment & Plan Note (Signed)
Patient with a likely TIA though she needs to see neurology for an EEG to rule out underlying seizure.  She will remain on aspirin and Plavix for total of 3 weeks.  After that she will take just aspirin 81 mg daily.  She will keep her neurology follow-up.  She will keep her cardiology follow-up.  She will seek medical attention for recurrence of symptoms. ?

## 2022-04-07 NOTE — Assessment & Plan Note (Signed)
Above goal.  We will increase her lisinopril to 40 mg once daily.  She will return in 1 week for labs and a nurse blood pressure check.  I did discuss the potential for adding an additional blood pressure medication.  She will continue to monitor this at home. ?

## 2022-04-07 NOTE — Patient Instructions (Addendum)
Nice to see you. ?Please complete a 3-week course of aspirin and Plavix.  At the end of 3 weeks she will discontinue the Plavix and continue on aspirin only. ?We are increasing your lisinopril to 40 mg once daily.  You will have lab work and a blood pressure check in 1 week. ?Please make sure you keep your follow-up with neurology and cardiology. ?If your symptoms recur please be reevaluated. ?

## 2022-04-12 ENCOUNTER — Ambulatory Visit: Payer: Medicare Other | Admitting: Family Medicine

## 2022-04-13 DIAGNOSIS — R4781 Slurred speech: Secondary | ICD-10-CM | POA: Diagnosis not present

## 2022-04-13 DIAGNOSIS — Z7689 Persons encountering health services in other specified circumstances: Secondary | ICD-10-CM | POA: Diagnosis not present

## 2022-04-13 DIAGNOSIS — R410 Disorientation, unspecified: Secondary | ICD-10-CM | POA: Diagnosis not present

## 2022-04-13 DIAGNOSIS — H538 Other visual disturbances: Secondary | ICD-10-CM | POA: Diagnosis not present

## 2022-04-13 DIAGNOSIS — G459 Transient cerebral ischemic attack, unspecified: Secondary | ICD-10-CM | POA: Diagnosis not present

## 2022-04-14 ENCOUNTER — Encounter: Payer: Self-pay | Admitting: *Deleted

## 2022-04-14 ENCOUNTER — Ambulatory Visit (INDEPENDENT_AMBULATORY_CARE_PROVIDER_SITE_OTHER): Payer: Medicare Other | Admitting: *Deleted

## 2022-04-14 DIAGNOSIS — I10 Essential (primary) hypertension: Secondary | ICD-10-CM | POA: Diagnosis not present

## 2022-04-14 LAB — BASIC METABOLIC PANEL
BUN: 22 mg/dL (ref 6–23)
CO2: 30 mEq/L (ref 19–32)
Calcium: 9.9 mg/dL (ref 8.4–10.5)
Chloride: 104 mEq/L (ref 96–112)
Creatinine, Ser: 0.98 mg/dL (ref 0.40–1.20)
GFR: 57.78 mL/min — ABNORMAL LOW (ref >60.00)
Glucose, Bld: 114 mg/dL — ABNORMAL HIGH (ref 70–99)
Potassium: 3.8 mEq/L (ref 3.5–5.1)
Sodium: 141 mEq/L (ref 135–145)

## 2022-04-14 NOTE — Progress Notes (Signed)
Patient here for nurse visit BP check per order from 04/07/2022.  ? ?Patient reports compliance with prescribed BP medications: yes provider increased lisinopril to 40 mg daily. ? ?Last dose of BP medication: 10:00 ? ?BP Readings from Last 3 Encounters:  ?04/14/22 138/78  ?04/07/22 (!) 146/87  ?04/03/22 134/73  ? ?Pulse Readings from Last 3 Encounters:  ?04/14/22 72  ?04/07/22 83  ?04/05/22 65  ? ? ? ? ?Patient verbalized understanding of instructions.  ? ?Kerin Salen, RN  ?

## 2022-04-17 ENCOUNTER — Telehealth: Payer: Self-pay | Admitting: *Deleted

## 2022-04-17 ENCOUNTER — Encounter: Payer: Self-pay | Admitting: Cardiovascular Disease

## 2022-04-17 NOTE — Telephone Encounter (Signed)
Left Message to call office please advise patient of message below from recent nurse visit for blood pressure check. ? ?BP is acceptable for her age. She should continue with her current medications.  ?  ?[name], MD ?

## 2022-04-17 NOTE — Progress Notes (Signed)
Left message to call office

## 2022-04-18 ENCOUNTER — Encounter: Payer: Self-pay | Admitting: *Deleted

## 2022-04-18 NOTE — Telephone Encounter (Signed)
Left voicemail message to schedule appointment with patient and also sent My Chart message as well.  ?

## 2022-04-18 NOTE — Progress Notes (Signed)
Called and sent mychart message

## 2022-04-24 DIAGNOSIS — M9973 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: Secondary | ICD-10-CM | POA: Insufficient documentation

## 2022-04-24 DIAGNOSIS — M47816 Spondylosis without myelopathy or radiculopathy, lumbar region: Secondary | ICD-10-CM | POA: Insufficient documentation

## 2022-04-24 DIAGNOSIS — M7582 Other shoulder lesions, left shoulder: Secondary | ICD-10-CM | POA: Diagnosis not present

## 2022-05-05 ENCOUNTER — Encounter: Payer: Self-pay | Admitting: Internal Medicine

## 2022-05-05 ENCOUNTER — Inpatient Hospital Stay: Payer: Medicare Other | Attending: Hematology and Oncology

## 2022-05-05 ENCOUNTER — Inpatient Hospital Stay (HOSPITAL_BASED_OUTPATIENT_CLINIC_OR_DEPARTMENT_OTHER): Payer: Medicare Other | Admitting: Internal Medicine

## 2022-05-05 DIAGNOSIS — C50411 Malignant neoplasm of upper-outer quadrant of right female breast: Secondary | ICD-10-CM | POA: Insufficient documentation

## 2022-05-05 DIAGNOSIS — I10 Essential (primary) hypertension: Secondary | ICD-10-CM | POA: Insufficient documentation

## 2022-05-05 DIAGNOSIS — Z8673 Personal history of transient ischemic attack (TIA), and cerebral infarction without residual deficits: Secondary | ICD-10-CM | POA: Insufficient documentation

## 2022-05-05 DIAGNOSIS — Z8051 Family history of malignant neoplasm of kidney: Secondary | ICD-10-CM | POA: Diagnosis not present

## 2022-05-05 DIAGNOSIS — Z8042 Family history of malignant neoplasm of prostate: Secondary | ICD-10-CM | POA: Insufficient documentation

## 2022-05-05 DIAGNOSIS — Z90710 Acquired absence of both cervix and uterus: Secondary | ICD-10-CM | POA: Diagnosis not present

## 2022-05-05 DIAGNOSIS — Z170 Estrogen receptor positive status [ER+]: Secondary | ICD-10-CM | POA: Insufficient documentation

## 2022-05-05 DIAGNOSIS — Z803 Family history of malignant neoplasm of breast: Secondary | ICD-10-CM | POA: Insufficient documentation

## 2022-05-05 DIAGNOSIS — E782 Mixed hyperlipidemia: Secondary | ICD-10-CM

## 2022-05-05 DIAGNOSIS — R55 Syncope and collapse: Secondary | ICD-10-CM

## 2022-05-05 DIAGNOSIS — Z7901 Long term (current) use of anticoagulants: Secondary | ICD-10-CM | POA: Insufficient documentation

## 2022-05-05 DIAGNOSIS — Z808 Family history of malignant neoplasm of other organs or systems: Secondary | ICD-10-CM | POA: Diagnosis not present

## 2022-05-05 LAB — CBC WITH DIFFERENTIAL/PLATELET
Abs Immature Granulocytes: 0.04 10*3/uL (ref 0.00–0.07)
Basophils Absolute: 0 10*3/uL (ref 0.0–0.1)
Basophils Relative: 0 %
Eosinophils Absolute: 0.1 10*3/uL (ref 0.0–0.5)
Eosinophils Relative: 1 %
HCT: 44.1 % (ref 36.0–46.0)
Hemoglobin: 14 g/dL (ref 12.0–15.0)
Immature Granulocytes: 1 %
Lymphocytes Relative: 28 %
Lymphs Abs: 1.7 10*3/uL (ref 0.7–4.0)
MCH: 28.2 pg (ref 26.0–34.0)
MCHC: 31.7 g/dL (ref 30.0–36.0)
MCV: 88.9 fL (ref 80.0–100.0)
Monocytes Absolute: 0.3 10*3/uL (ref 0.1–1.0)
Monocytes Relative: 5 %
Neutro Abs: 3.8 10*3/uL (ref 1.7–7.7)
Neutrophils Relative %: 65 %
Platelets: 222 10*3/uL (ref 150–400)
RBC: 4.96 MIL/uL (ref 3.87–5.11)
RDW: 14.1 % (ref 11.5–15.5)
WBC: 5.9 10*3/uL (ref 4.0–10.5)
nRBC: 0 % (ref 0.0–0.2)

## 2022-05-05 LAB — BASIC METABOLIC PANEL
Anion gap: 10 (ref 5–15)
BUN: 25 mg/dL — ABNORMAL HIGH (ref 8–23)
CO2: 29 mmol/L (ref 22–32)
Calcium: 9.2 mg/dL (ref 8.9–10.3)
Chloride: 102 mmol/L (ref 98–111)
Creatinine, Ser: 1.03 mg/dL — ABNORMAL HIGH (ref 0.44–1.00)
GFR, Estimated: 58 mL/min — ABNORMAL LOW (ref >60)
Glucose, Bld: 106 mg/dL — ABNORMAL HIGH (ref 70–99)
Potassium: 4 mmol/L (ref 3.5–5.1)
Sodium: 141 mmol/L (ref 135–145)

## 2022-05-05 LAB — PROTIME-INR
INR: 1 (ref 0.8–1.2)
Prothrombin Time: 12.6 seconds (ref 11.4–15.2)

## 2022-05-05 LAB — APTT: aPTT: 27 seconds (ref 24–36)

## 2022-05-05 MED ORDER — EXEMESTANE 25 MG PO TABS: 25.0000 mg | ORAL_TABLET | Freq: Every day | ORAL | 3 refills | Status: DC

## 2022-05-05 NOTE — Progress Notes (Signed)
Pt has concerns regarding Anastrozole medication which is currently on hold.

## 2022-05-05 NOTE — Progress Notes (Signed)
[place] OFFICE PROGRESS NOTE  Patient Care Team: Leone Haven, MD as PCP - General (Family Medicine) Wellington Hampshire, MD as PCP - Cardiology (Cardiology) Clent Jacks, RN as Registered Nurse Cammie Sickle, MD as Consulting Physician (Oncology)   Cancer Staging  No matching staging information was found for the patient.   Oncology History Overview Note  # RIGHT BREAST CANCER stage I [pTapN0; G-1; ER-100%; PR-99%; Her-2-NEG; Dr.Sakai]. NO Oncotype;s/p RT [12/29/2019]  # MARCH 1st 2021- Anastrzole;  STOPPED anastrozole [stopped in April, 2023]; June 2nd, 2023- Examestane 25 mg/day.     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   DIAGNOSIS: Right breast  STAGE:   I      ;  GOALS: cure  CURRENT/MOST RECENT THERAPY : anastrazole   History of lymphoma  Carcinoma of upper-outer quadrant of right breast in female, estrogen receptor positive ([place])  10/28/2019 Initial Diagnosis   Carcinoma of upper-outer quadrant of right breast in female, estrogen receptor positive ([place])   03/20/2020 Genetic Testing   Negative genetic testing on the multi-cancer panel.  The Multi-Gene Panel offered by [facility] includes sequencing and/or deletion duplication testing of the following 85 genes: AIP, ALK, APC, ATM, AXIN2,BAP1,  BARD1, BLM, BMPR1A, BRCA1, BRCA2, BRIP1, CASR, CDC73, CDH1, CDK4, CDKN1B, CDKN1C, CDKN2A (p14ARF), CDKN2A (p16INK4a), CEBPA, CHEK2, CTNNA1, DICER1, DIS3L2, EGFR (c.2369C>T, p.Thr790Met variant only), EPCAM (Deletion/duplication testing only), FH, FLCN, GATA2, GPC3, GREM1 (Promoter region deletion/duplication testing only), HOXB13  (c.251G>A, p.Gly84Glu), HRAS, KIT, MAX, MEN1, MET, MITF (c.952G>A, p.Glu318Lys variant only), MLH1, MSH2, MSH3, MSH6, MUTYH, NBN, NF1, NF2, NTHL1, PALB2, PDGFRA, PHOX2B, PMS2, POLD1, POLE, POT1, PRKAR1A, PTCH1, PTEN, RAD50, RAD51C, RAD51D, RB1, RECQL4, RET, RNF43, RUNX1, SDHAF2, SDHA (sequence changes only), SDHB, SDHC, SDHD, SMAD4, SMARCA4, SMARCB1, SMARCE1, STK11, SUFU, TERC, TERT, TMEM127, TP53, TSC1, TSC2, VHL, WRN and WT1.  The report date is March 20, 2020.       INTERVAL HISTORY: Ambulating independently.  Alone.   Bailey Brooks 71 y.o.  female pleasant patient stage I ER/PR positive HER-2 negative breast cancer most recently on anastrazole [macrh 2021] is here for follow-up.  In the interim patient was taken off anastrozole about a month ago because of poor tolerance because of severe joint pains/myalgias.  Also she complained of nausea and extreme fatigue.  Since being off anastrozole for a month-she has noted significant improvement in her overall health including joint pains myalgias.  She feels that she is back to baseline.  Review of Systems  Constitutional:  Positive for malaise/fatigue. Negative for chills, diaphoresis and fever.  HENT:  Negative for nosebleeds and sore throat.   Eyes:  Negative for double vision.  Respiratory:  Negative for cough, hemoptysis, sputum production, shortness of breath and wheezing.   Cardiovascular:  Negative for chest pain, palpitations, orthopnea and leg swelling.  Gastrointestinal:  Negative for abdominal pain, blood in stool, constipation, diarrhea, heartburn, melena, nausea and vomiting.  Genitourinary:  Negative for dysuria, frequency and urgency.  Musculoskeletal:  Positive for back pain and joint pain.  Skin: Negative.  Negative for itching and rash.  Neurological:  Negative for dizziness, tingling, focal weakness, weakness and headaches.  Endo/Heme/Allergies:  Does not bruise/bleed easily.  Psychiatric/Behavioral:  Negative for  depression. The  patient is not nervous/anxious and does not have insomnia.      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 11/2016 Echo: EF 60-65%, no rwma, mild LVH, Gr1 DD, triv MR, mildly dil LA, nl RV fxn; b. 03/2022 Echo: EF 60-65%, no rwma, nl RV fxn, mild MR.   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 Madelynn Done s/p RCHOP and methotrexate, c/b renal failure   Diverticulosis    Esophagitis    Family history of breast cancer    Family history of melanoma    Family history of prostate cancer    GERD (gastroesophageal reflux disease)    H/O stem cell transplant ([place]) 06/2011   a. in setting of lymphoma.   Hemorrhoids    History of chemotherapy 22-Oct-2010   RHCOP/methotrexate-intrathecal   History of stress test    a. 05/2010 Myoview: nl EF, no ischemia/infarct.   Hypercholesterolemia    Hypertension    Hypothyroidism    Morbid obesity ([place])    [place] reservoir present    IN SCALP FROM 2012   Paget's disease of vulva ([place])    a. 2014 s/p resection.   Personal history of radiation therapy 2020-2021   right breast ca   Syncope    TIA (transient ischemic attack)    a. X 2 - last in 2018; b. 02/2017 event monitor: No significant arrhythmias.    PAST SURGICAL HISTORY :   Past Surgical History:  Procedure Laterality Date   ABDOMINAL HYSTERECTOMY  1985   Hysterectomy-partial   BREAST BIOPSY Right 2002   Neg - AT [facility]   BREAST BIOPSY Right 2020   bx done at [facility]?, IDC and DCIS   BREAST LUMPECTOMY Right 10/16/2019   IDC and DCIS, negative LN   BURR HOLE W/ PLACEMENT OMMAYA RESERVOIR     BURR HOLE W/ PLACEMENT OMMAYA RESERVOIR  2012   COLONOSCOPY  04/2013   ESOPHAGOGASTRODUODENOSCOPY  04/2013    INJECTION KNEE  07/02/2018   INSERTION CENTRAL VENOUS ACCESS DEVICE W/ SUBCUTANEOUS PORT  2011   Port a Cath: Right chest Double Lumen, 04-Nov-2010   LIMBAL STEM CELL TRANSPLANT  2012   LIVER BIOPSY     stage 4B large Bcell lymphoma   PARTIAL MASTECTOMY WITH NEEDLE LOCALIZATION AND AXILLARY SENTINEL LYMPH NODE BX Right 10/16/2019   Procedure: PARTIAL MASTECTOMY WITH NEEDLE LOCALIZATION AND AXILLARY SENTINEL LYMPH NODE BX;  Surgeon: Benjamine Sprague, DO;  Location: [facility] ORS;  Service: General;  Laterality: Right;   ROTATOR CUFF REPAIR Right 2016   SHOULDER ARTHROSCOPY Right 06/22/2015   Procedure: ARTHROSCOPY SHOULDER, parital repair of rotator cuff, biceps tenodesis, decompression and debridement;  Surgeon: Corky Mull, MD;  Location: [facility] OR[facility];  Service: Orthopedics;  Laterality: Right;   [place] PARTIAL N/A 09/29/2015   Procedure: VULVECTOMY PARTIAL;  Surgeon: Gillis Ends, MD;  Location: [facility] ORS;  Service: Gynecology;  Laterality: N/A;    FAMILY HISTORY :   Family History  Problem Relation Age of Onset   Diabetes Mother        died @ 3 of MI.  Hypertension Mother    Heart attack Mother    Melanoma Father 23       died of complications r/t melanoma w/ lung mets.   Kidney disease Sister        Kidney removed    Breast cancer Sister 65   Prostate cancer Brother        Prostate - dx in 48's   Heart disease Brother 10       reported MI @ age 53, ? treated w/ TPA->no recurrent CAD, now in 66's.   Hearing loss Maternal Aunt    Cancer Maternal Aunt        pancreatic vs colon cancer   Hearing loss Maternal Grandfather    Other Maternal Grandmother        flu pandemic   Kidney cancer Niece 44       partial nephrectomy    SOCIAL HISTORY:   Social History   Tobacco Use   Smoking status: Never   Smokeless tobacco: Never  Vaping Use   Vaping Use: Never used  Substance Use Topics   Alcohol use: No    Alcohol/week: 0.0  standard drinks   Drug use: No    ALLERGIES:  is allergic to morphine and related, sulfa antibiotics, iodine, adhesive [tape], erythromycin, and oxycodone.  MEDICATIONS:  Current Outpatient Medications  Medication Sig Dispense Refill   aspirin EC 81 MG tablet Take 81 mg by mouth daily.     atorvastatin (LIPITOR) 80 MG tablet TAKE 1 TABLET(80 MG) BY MOUTH DAILY 90 tablet 3   cholecalciferol (VITAMIN D3) 25 MCG (1000 UT) tablet Take 1,000 Units by mouth daily.     exemestane (AROMASIN) 25 MG tablet Take 1 tablet (25 mg total) by mouth daily after breakfast. 30 tablet 3   ezetimibe (ZETIA) 10 MG tablet TAKE 1 TABLET(10 MG) BY MOUTH DAILY 90 tablet 3   ketoconazole (NIZORAL) 2 % cream Apply twice daily as directed. 60 g 2   lansoprazole (PREVACID) 30 MG capsule Take 1 Capsule (30MG) by mouth daily. 90 capsule 1   levothyroxine (SYNTHROID) 88 MCG tablet Take 1 tablet (88 mcg total) by mouth every morning. on an empty stomach 90 tablet 3   lisinopril (ZESTRIL) 40 MG tablet Take 1 tablet (40 mg total) by mouth daily. 90 tablet 3   mometasone (ELOCON) 0.1 % cream Apply to itchy spots on body 1-2 times until improved. Avoid face, groin, underarms. 50 g 3   Multiple Vitamin (MULTIVITAMIN) tablet Take 1 tablet by mouth daily.     naproxen (NAPROSYN) 250 MG tablet Take by mouth 2 (two) times daily with a meal.     Specialty Vitamins Products (ICAPS LUTEIN & ZEAXANTHIN PO) Take by mouth every morning. Zeaxanthin 33m/Lutein 182m    tacrolimus (PROTOPIC) 0.1 % ointment Apply 1-2 times daily as needed for itch to affected areas. 60 g 2   No current facility-administered medications for this visit.   PHYSICAL EXAMINATION: ECOG PERFORMANCE STATUS: 0 - Asymptomatic  BP 112/65 (BP Location: Right Wrist)   Pulse 81   Temp 98.8 F (37.1 C) (Tympanic)   Resp 16   Wt 238 lb (108 kg)   SpO2 96%   BMI 42.16 kg/m   Filed Weights   05/05/22 1009  Weight: 238 lb (108 kg)    Physical Activity: Not on  file   Physical Exam HENT:     Head: Normocephalic and atraumatic.     Mouth/Throat:     Pharynx: No  oropharyngeal exudate.  Eyes:     Pupils: Pupils are equal, round, and reactive to light.  Cardiovascular:     Rate and Rhythm: Normal rate and regular rhythm.  Pulmonary:     Effort: No respiratory distress.     Breath sounds: No wheezing.  Abdominal:     General: Bowel sounds are normal. There is no distension.     Palpations: Abdomen is soft. There is no mass.     Tenderness: There is no abdominal tenderness. There is no guarding or rebound.  Musculoskeletal:        General: No tenderness. Normal range of motion.     Cervical back: Normal range of motion and neck supple.  Skin:    General: Skin is warm.  Neurological:     Mental Status: She is alert and oriented to person, place, and time.  Psychiatric:        Mood and Affect: Affect normal.   LABORATORY DATA:  I have reviewed the data as listed    Component Value Date/Time   NA 141 05/05/2022 0949   NA 138 09/03/2013 0809   K 4.0 05/05/2022 0949   K 4.3 11/02/2014 0932   CL 102 05/05/2022 0949   CL 105 09/03/2013 0809   CO2 29 05/05/2022 0949   CO2 28 09/03/2013 0809   GLUCOSE 106 (H) 05/05/2022 0949   GLUCOSE 100 (H) 09/03/2013 0809   BUN 25 (H) 05/05/2022 0949   BUN 17 09/03/2013 0809   CREATININE 1.03 (H) 05/05/2022 0949   CREATININE 0.96 11/02/2014 0929   CALCIUM 9.2 05/05/2022 0949   CALCIUM 9.1 09/03/2013 0809   PROT 7.1 04/05/2022 0959   PROT 6.6 11/02/2014 0929   ALBUMIN 3.7 04/05/2022 0959   ALBUMIN 3.3 (L) 11/02/2014 0929   AST 24 04/05/2022 0959   AST 16 11/02/2014 0929   ALT 20 04/05/2022 0959   ALT 19 11/02/2014 0929   ALKPHOS 91 04/05/2022 0959   ALKPHOS 87 11/02/2014 0929   BILITOT 0.7 04/05/2022 0959   BILITOT 0.3 11/02/2014 0929   GFRNONAA 58 (L) 05/05/2022 0949   GFRNONAA >60 11/02/2014 0929   GFRNONAA >60 07/27/2014 0836   GFRAA >60 07/05/2020 0923   GFRAA >60 11/02/2014 0929    GFRAA >60 07/27/2014 0836    No results found for: SPEP, UPEP  Lab Results  Component Value Date   WBC 5.9 05/05/2022   NEUTROABS 3.8 05/05/2022   HGB 14.0 05/05/2022   HCT 44.1 05/05/2022   MCV 88.9 05/05/2022   PLT 222 05/05/2022      Chemistry      Component Value Date/Time   NA 141 05/05/2022 0949   NA 138 09/03/2013 0809   K 4.0 05/05/2022 0949   K 4.3 11/02/2014 0932   CL 102 05/05/2022 0949   CL 105 09/03/2013 0809   CO2 29 05/05/2022 0949   CO2 28 09/03/2013 0809   BUN 25 (H) 05/05/2022 0949   BUN 17 09/03/2013 0809   CREATININE 1.03 (H) 05/05/2022 0949   CREATININE 0.96 11/02/2014 0929      Component Value Date/Time   CALCIUM 9.2 05/05/2022 0949   CALCIUM 9.1 09/03/2013 0809   ALKPHOS 91 04/05/2022 0959   ALKPHOS 87 11/02/2014 0929   AST 24 04/05/2022 0959   AST 16 11/02/2014 0929   ALT 20 04/05/2022 0959   ALT 19 11/02/2014 0929   BILITOT 0.7 04/05/2022 0959   BILITOT 0.3 11/02/2014 0929       RADIOGRAPHIC  STUDIES: I have personally reviewed the radiological images as listed and agreed with the findings in the report. No results found.   ASSESSMENT & PLAN:  Carcinoma of upper-outer quadrant of right breast in female, estrogen receptor positive ([place]) # Right breast cancer stage I ER/PRPos; HER-2 negative. Currently OFF anastrozole [stopped in April, 2023]- ;  STABLE; mammo- Nov 2021- WNL; awaiting this yeay- NOV 2022 [PCP];  # SWITCH to EXEMESTANE 25 /mg day [starting- June 2nd, 2023].  Again reviewed the potential side effects including but not limited to arthralgias [less common]; hot flashes etc.  # ? TIA-on monitor [April 2023] on asprin +plavix- STABLE.   # Joint aches- G-1-2 improved OFF anastrazole; monitor closely.   # Bone density test-February 2023--normal limits continue calcium plus vitamin D. Discuss.   mychart # DISPOSITION: # Follow up in week of July 10 th- MD;-  Dr.B    No orders of the defined types were placed in this  encounter.  All questions were answered. The patient knows to call the clinic with any problems, questions or concerns.      Cammie Sickle, MD 05/05/2022 12:52 PM

## 2022-05-05 NOTE — Assessment & Plan Note (Addendum)
#  Right breast cancer stage I ER/PRPos; HER-2 negative. Currently OFF anastrozole [stopped in April, 2023]- ;  STABLE; mammo- Nov 2021- WNL; awaiting this yeay- NOV 2022 [PCP];  # SWITCH to EXEMESTANE 25 /mg day [starting- June 2nd, 2023].  Again reviewed the potential side effects including but not limited to arthralgias [less common]; hot flashes etc.  # ? TIA-on monitor [April 2023] on asprin +plavix- STABLE.   # Joint aches- G-1-2 improved OFF anastrazole; monitor closely.   # Bone density test-February 2023--normal limits continue calcium plus vitamin D. Discuss.   mychart # DISPOSITION: # Follow up in week of July 10 th- MD;-  Dr.B

## 2022-05-11 DIAGNOSIS — G8929 Other chronic pain: Secondary | ICD-10-CM | POA: Diagnosis not present

## 2022-05-11 DIAGNOSIS — M545 Low back pain, unspecified: Secondary | ICD-10-CM | POA: Diagnosis not present

## 2022-05-11 DIAGNOSIS — M47816 Spondylosis without myelopathy or radiculopathy, lumbar region: Secondary | ICD-10-CM | POA: Diagnosis not present

## 2022-05-15 ENCOUNTER — Other Ambulatory Visit: Payer: Self-pay | Admitting: Family Medicine

## 2022-05-17 DIAGNOSIS — G8929 Other chronic pain: Secondary | ICD-10-CM | POA: Diagnosis not present

## 2022-05-17 DIAGNOSIS — M545 Low back pain, unspecified: Secondary | ICD-10-CM | POA: Diagnosis not present

## 2022-05-25 DIAGNOSIS — G8929 Other chronic pain: Secondary | ICD-10-CM | POA: Diagnosis not present

## 2022-05-25 DIAGNOSIS — M545 Low back pain, unspecified: Secondary | ICD-10-CM | POA: Diagnosis not present

## 2022-05-26 ENCOUNTER — Encounter: Payer: Self-pay | Admitting: Cardiovascular Disease

## 2022-05-26 ENCOUNTER — Ambulatory Visit: Payer: Medicare Other | Admitting: Cardiovascular Disease

## 2022-05-26 VITALS — BP 138/90 | HR 78 | Ht 63.0 in | Wt 239.5 lb

## 2022-05-26 DIAGNOSIS — E785 Hyperlipidemia, unspecified: Secondary | ICD-10-CM | POA: Diagnosis not present

## 2022-05-26 DIAGNOSIS — Z8673 Personal history of transient ischemic attack (TIA), and cerebral infarction without residual deficits: Secondary | ICD-10-CM | POA: Diagnosis not present

## 2022-05-26 DIAGNOSIS — I471 Supraventricular tachycardia: Secondary | ICD-10-CM | POA: Diagnosis not present

## 2022-05-26 DIAGNOSIS — I10 Essential (primary) hypertension: Secondary | ICD-10-CM | POA: Diagnosis not present

## 2022-06-02 DIAGNOSIS — G8929 Other chronic pain: Secondary | ICD-10-CM | POA: Diagnosis not present

## 2022-06-02 DIAGNOSIS — M545 Low back pain, unspecified: Secondary | ICD-10-CM | POA: Diagnosis not present

## 2022-06-03 ENCOUNTER — Encounter: Payer: Self-pay | Admitting: Family Medicine

## 2022-06-07 DIAGNOSIS — G8929 Other chronic pain: Secondary | ICD-10-CM | POA: Diagnosis not present

## 2022-06-07 DIAGNOSIS — M545 Low back pain, unspecified: Secondary | ICD-10-CM | POA: Diagnosis not present

## 2022-06-12 ENCOUNTER — Inpatient Hospital Stay: Payer: Medicare Other | Attending: Hematology and Oncology | Admitting: Internal Medicine

## 2022-06-12 DIAGNOSIS — Z8673 Personal history of transient ischemic attack (TIA), and cerebral infarction without residual deficits: Secondary | ICD-10-CM | POA: Diagnosis not present

## 2022-06-12 DIAGNOSIS — C50411 Malignant neoplasm of upper-outer quadrant of right female breast: Secondary | ICD-10-CM | POA: Insufficient documentation

## 2022-06-12 DIAGNOSIS — Z7902 Long term (current) use of antithrombotics/antiplatelets: Secondary | ICD-10-CM | POA: Diagnosis not present

## 2022-06-12 DIAGNOSIS — Z8544 Personal history of malignant neoplasm of other female genital organs: Secondary | ICD-10-CM | POA: Insufficient documentation

## 2022-06-12 DIAGNOSIS — Z8572 Personal history of non-Hodgkin lymphomas: Secondary | ICD-10-CM | POA: Diagnosis not present

## 2022-06-12 DIAGNOSIS — Z170 Estrogen receptor positive status [ER+]: Secondary | ICD-10-CM | POA: Diagnosis not present

## 2022-06-12 DIAGNOSIS — Z9221 Personal history of antineoplastic chemotherapy: Secondary | ICD-10-CM | POA: Insufficient documentation

## 2022-06-12 DIAGNOSIS — M2550 Pain in unspecified joint: Secondary | ICD-10-CM | POA: Diagnosis not present

## 2022-06-12 DIAGNOSIS — Z9484 Stem cells transplant status: Secondary | ICD-10-CM | POA: Insufficient documentation

## 2022-06-12 DIAGNOSIS — Z79899 Other long term (current) drug therapy: Secondary | ICD-10-CM | POA: Insufficient documentation

## 2022-06-12 DIAGNOSIS — Z7982 Long term (current) use of aspirin: Secondary | ICD-10-CM | POA: Insufficient documentation

## 2022-06-12 DIAGNOSIS — Z79811 Long term (current) use of aromatase inhibitors: Secondary | ICD-10-CM | POA: Diagnosis not present

## 2022-06-12 DIAGNOSIS — Z923 Personal history of irradiation: Secondary | ICD-10-CM | POA: Diagnosis not present

## 2022-06-12 NOTE — Progress Notes (Signed)
[facility] OFFICE PROGRESS NOTE  Patient Care Team: Leone Haven, MD as PCP - General (Family Medicine) Wellington Hampshire, MD as PCP - Cardiology (Cardiology) Clent Jacks, RN as Registered Nurse Cammie Sickle, MD as Consulting Physician (Oncology)   Cancer Staging  No matching staging information was found for the patient.   Oncology History Overview Note  # RIGHT BREAST CANCER stage I [pTapN0; G-1; ER-100%; PR-99%; Her-2-NEG; Dr.Sakai]. NO Oncotype;s/p RT [12/29/2019]  # MARCH 1st 2021- Anastrzole;  STOPPED anastrozole [stopped in April, 2023]; June 2nd, 2023- Examestane 25 mg/day.     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   DIAGNOSIS: Right breast  STAGE:   I      ;  GOALS: cure    History of lymphoma  Carcinoma of upper-outer quadrant of right breast in female, estrogen receptor positive ([place])  10/28/2019 Initial Diagnosis   Carcinoma of upper-outer quadrant of right breast in female, estrogen receptor positive ([place])   03/20/2020 Genetic Testing   Negative genetic testing on the multi-cancer panel.  The Multi-Gene Panel offered by [facility] includes sequencing and/or deletion duplication testing of the following 85 genes: AIP, ALK, APC, ATM, AXIN2,BAP1,  BARD1, BLM, BMPR1A, BRCA1, BRCA2, BRIP1, CASR, CDC73, CDH1, CDK4, CDKN1B, CDKN1C, CDKN2A (p14ARF), CDKN2A (p16INK4a), CEBPA, CHEK2, CTNNA1, DICER1, DIS3L2, EGFR (c.2369C>T, p.Thr790Met variant only), EPCAM (Deletion/duplication testing only), FH, FLCN, GATA2, GPC3, GREM1 (Promoter region deletion/duplication testing only), HOXB13 (c.251G>A, p.Gly84Glu), HRAS, KIT, MAX,  MEN1, MET, MITF (c.952G>A, p.Glu318Lys variant only), MLH1, MSH2, MSH3, MSH6, MUTYH, NBN, NF1, NF2, NTHL1, PALB2, PDGFRA, PHOX2B, PMS2, POLD1, POLE, POT1, PRKAR1A, PTCH1, PTEN, RAD50, RAD51C, RAD51D, RB1, RECQL4, RET, RNF43, RUNX1, SDHAF2, SDHA (sequence changes only), SDHB, SDHC, SDHD, SMAD4, SMARCA4, SMARCB1, SMARCE1, STK11, SUFU, TERC, TERT, TMEM127, TP53, TSC1, TSC2, VHL, WRN and WT1.  The report date is March 20, 2020.       INTERVAL HISTORY: Ambulating independently.  Alone.   Bailey Brooks 71 y.o.  female pleasant patient stage I ER/PR positive HER-2 negative breast cancer most r currently on adjuvant exemestane is here for follow-up.  Patient complains of mild to moderate worsening of her joint pains.  Also complains of myalgias. Denies any falls.  No nausea vomiting.  Admits to compliance with her exemestane.  Review of Systems  Constitutional:  Positive for malaise/fatigue. Negative for chills, diaphoresis and fever.  HENT:  Negative for nosebleeds and sore throat.   Eyes:  Negative for double vision.  Respiratory:  Negative for cough, hemoptysis, sputum production, shortness of breath and wheezing.   Cardiovascular:  Negative for chest pain, palpitations, orthopnea and leg swelling.  Gastrointestinal:  Negative for abdominal pain, blood in stool, constipation, diarrhea, heartburn, melena, nausea and vomiting.  Genitourinary:  Negative for dysuria, frequency and urgency.  Musculoskeletal:  Positive for back pain and joint pain.  Skin: Negative.  Negative for itching and rash.  Neurological:  Negative for dizziness, tingling, focal weakness, weakness and headaches.  Endo/Heme/Allergies:  Does not bruise/bleed easily.  Psychiatric/Behavioral:  Negative for depression. The patient is not nervous/anxious and does not have insomnia.       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 11/2016 Echo: EF 60-65%, no rwma, mild LVH, Gr1 DD, triv MR, mildly dil LA, nl RV fxn; b. 03/2022 Echo: EF 60-65%, no rwma, nl RV fxn, mild MR.   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 Madelynn Done s/p RCHOP and methotrexate, c/b renal failure   Diverticulosis    Esophagitis    Family history of breast cancer    Family history of melanoma    Family history of prostate cancer    GERD (gastroesophageal reflux disease)    H/O stem cell transplant ([place]) 06/2011   a. in setting of lymphoma.   Hemorrhoids    History of chemotherapy 22-Oct-2010   RHCOP/methotrexate-intrathecal   History of stress test    a. 05/2010 Myoview: nl EF, no ischemia/infarct.   Hypercholesterolemia    Hypertension    Hypothyroidism    Morbid obesity ([place])    [place] reservoir present    IN SCALP FROM 2012   Paget's disease of vulva ([place])    a. 2014 s/p resection.   Personal history of radiation therapy 2020-2021   right breast ca   Syncope    TIA (transient ischemic attack)    a. X 2 - last in 2018; b. 02/2017 event monitor: No significant arrhythmias.    PAST SURGICAL HISTORY :   Past Surgical History:  Procedure Laterality Date   ABDOMINAL HYSTERECTOMY  1985   Hysterectomy-partial   BREAST BIOPSY Right 2002   Neg - AT [facility]   BREAST BIOPSY Right 2020   bx done at [facility]?, IDC and DCIS   BREAST LUMPECTOMY Right 10/16/2019   IDC and DCIS, negative LN   BURR HOLE W/ PLACEMENT OMMAYA RESERVOIR     BURR HOLE W/ PLACEMENT OMMAYA RESERVOIR  2012   COLONOSCOPY  04/2013   ESOPHAGOGASTRODUODENOSCOPY  04/2013   INJECTION KNEE  07/02/2018   INSERTION CENTRAL VENOUS ACCESS DEVICE W/ SUBCUTANEOUS PORT  2011   Port a Cath: Right chest Double Lumen, 04-Nov-2010   LIMBAL STEM CELL TRANSPLANT  2012   LIVER BIOPSY     stage 4B  large Bcell lymphoma   PARTIAL MASTECTOMY WITH NEEDLE LOCALIZATION AND AXILLARY SENTINEL LYMPH NODE BX Right 10/16/2019   Procedure: PARTIAL MASTECTOMY WITH NEEDLE LOCALIZATION AND AXILLARY SENTINEL LYMPH NODE BX;  Surgeon: Benjamine Sprague, DO;  Location: [facility] ORS;  Service: General;  Laterality: Right;   ROTATOR CUFF REPAIR Right 2016   SHOULDER ARTHROSCOPY Right 06/22/2015   Procedure: ARTHROSCOPY SHOULDER, parital repair of rotator cuff, biceps tenodesis, decompression and debridement;  Surgeon: Corky Mull, MD;  Location: [facility] ORS;  Service: Orthopedics;  Laterality: Right;   [place] PARTIAL N/A 09/29/2015   Procedure: VULVECTOMY PARTIAL;  Surgeon: Gillis Ends, MD;  Location: [facility] ORS;  Service: Gynecology;  Laterality: N/A;    FAMILY HISTORY :   Family History  Problem Relation Age of Onset   Diabetes Mother        died @ 27 of MI.   Hypertension Mother    Heart attack Mother    Melanoma Father 91       died of complications r/t melanoma w/ lung mets.   Kidney disease Sister  Kidney removed    Breast cancer Sister 9   Prostate cancer Brother        Prostate - dx in 54's   Heart disease Brother 55       reported MI @ age 62, ? treated w/ TPA->no recurrent CAD, now in 70's.   Hearing loss Maternal Aunt    Cancer Maternal Aunt        pancreatic vs colon cancer   Hearing loss Maternal Grandfather    Other Maternal Grandmother        flu pandemic   Kidney cancer Niece 33       partial nephrectomy    SOCIAL HISTORY:   Social History   Tobacco Use   Smoking status: Never   Smokeless tobacco: Never  Vaping Use   Vaping Use: Never used  Substance Use Topics   Alcohol use: No    Alcohol/week: 0.0 standard drinks of alcohol   Drug use: No    ALLERGIES:  is allergic to morphine and related, sulfa antibiotics, iodine, adhesive [tape], erythromycin, and oxycodone.  MEDICATIONS:  Current Outpatient Medications   Medication Sig Dispense Refill   aspirin EC 81 MG tablet Take 81 mg by mouth daily.     atorvastatin (LIPITOR) 80 MG tablet TAKE 1 TABLET(80 MG) BY MOUTH DAILY 90 tablet 3   cholecalciferol (VITAMIN D3) 25 MCG (1000 UT) tablet Take 1,000 Units by mouth daily.     exemestane (AROMASIN) 25 MG tablet Take 1 tablet (25 mg total) by mouth daily after breakfast. 30 tablet 3   ezetimibe (ZETIA) 10 MG tablet TAKE 1 TABLET(10 MG) BY MOUTH DAILY 90 tablet 3   ketoconazole (NIZORAL) 2 % cream Apply twice daily as directed. 60 g 2   levothyroxine (SYNTHROID) 88 MCG tablet Take 1 tablet (88 mcg total) by mouth every morning. on an empty stomach 90 tablet 3   lisinopril (ZESTRIL) 40 MG tablet Take 1 tablet (40 mg total) by mouth daily. 90 tablet 3   mometasone (ELOCON) 0.1 % cream Apply to itchy spots on body 1-2 times until improved. Avoid face, groin, underarms. 50 g 3   Multiple Vitamin (MULTIVITAMIN) tablet Take 1 tablet by mouth daily.     Specialty Vitamins Products (ICAPS LUTEIN & ZEAXANTHIN PO) Take by mouth every morning. Zeaxanthin 89m/Lutein 135m    tacrolimus (PROTOPIC) 0.1 % ointment Apply 1-2 times daily as needed for itch to affected areas. 60 g 2   lansoprazole (PREVACID) 30 MG capsule TAKE 1 CAPSULE BY MOUTH EVERY DAY 90 capsule 3   naproxen (NAPROSYN) 500 MG tablet TAKE ONE TABLET BY MOUTH TWICE DAILY AS NEEDED FOR MODERATE PAIN. TAKE WITH FOOD. 30 tablet 0   No current facility-administered medications for this visit.   PHYSICAL EXAMINATION: ECOG PERFORMANCE STATUS: 0 - Asymptomatic  BP (!) 157/85   Pulse 88   Temp 98.7 F (37.1 C)   Resp 20   Wt 234 lb 3.2 oz (106.2 kg)   SpO2 96%   BMI 41.49 kg/m   Filed Weights   06/12/22 1456  Weight: 234 lb 3.2 oz (106.2 kg)    Physical Activity: Unknown (03/10/2021)   Exercise Vital Sign    Days of Exercise per Week: 0 days    Minutes of Exercise per Session: Not on file   Physical Exam HENT:     Head: Normocephalic and  atraumatic.     Mouth/Throat:     Pharynx: No oropharyngeal exudate.  Eyes:  Pupils: Pupils are equal, round, and reactive to light.  Cardiovascular:     Rate and Rhythm: Normal rate and regular rhythm.  Pulmonary:     Effort: No respiratory distress.     Breath sounds: No wheezing.  Abdominal:     General: Bowel sounds are normal. There is no distension.     Palpations: Abdomen is soft. There is no mass.     Tenderness: There is no abdominal tenderness. There is no guarding or rebound.  Musculoskeletal:        General: No tenderness. Normal range of motion.     Cervical back: Normal range of motion and neck supple.  Skin:    General: Skin is warm.  Neurological:     Mental Status: She is alert and oriented to person, place, and time.  Psychiatric:        Mood and Affect: Affect normal.    LABORATORY DATA:  I have reviewed the data as listed    Component Value Date/Time   NA 141 05/05/2022 0949   NA 138 09/03/2013 0809   K 4.0 05/05/2022 0949   K 4.3 11/02/2014 0932   CL 102 05/05/2022 0949   CL 105 09/03/2013 0809   CO2 29 05/05/2022 0949   CO2 28 09/03/2013 0809   GLUCOSE 106 (H) 05/05/2022 0949   GLUCOSE 100 (H) 09/03/2013 0809   BUN 25 (H) 05/05/2022 0949   BUN 17 09/03/2013 0809   CREATININE 1.03 (H) 05/05/2022 0949   CREATININE 0.96 11/02/2014 0929   CALCIUM 9.2 05/05/2022 0949   CALCIUM 9.1 09/03/2013 0809   PROT 7.1 04/05/2022 0959   PROT 6.6 11/02/2014 0929   ALBUMIN 3.7 04/05/2022 0959   ALBUMIN 3.3 (L) 11/02/2014 0929   AST 24 04/05/2022 0959   AST 16 11/02/2014 0929   ALT 20 04/05/2022 0959   ALT 19 11/02/2014 0929   ALKPHOS 91 04/05/2022 0959   ALKPHOS 87 11/02/2014 0929   BILITOT 0.7 04/05/2022 0959   BILITOT 0.3 11/02/2014 0929   GFRNONAA 58 (L) 05/05/2022 0949   GFRNONAA >60 11/02/2014 0929   GFRNONAA >60 07/27/2014 0836   GFRAA >60 07/05/2020 0923   GFRAA >60 11/02/2014 0929   GFRAA >60 07/27/2014 0836    No results found for:  "SPEP", "UPEP"  Lab Results  Component Value Date   WBC 5.9 05/05/2022   NEUTROABS 3.8 05/05/2022   HGB 14.0 05/05/2022   HCT 44.1 05/05/2022   MCV 88.9 05/05/2022   PLT 222 05/05/2022      Chemistry      Component Value Date/Time   NA 141 05/05/2022 0949   NA 138 09/03/2013 0809   K 4.0 05/05/2022 0949   K 4.3 11/02/2014 0932   CL 102 05/05/2022 0949   CL 105 09/03/2013 0809   CO2 29 05/05/2022 0949   CO2 28 09/03/2013 0809   BUN 25 (H) 05/05/2022 0949   BUN 17 09/03/2013 0809   CREATININE 1.03 (H) 05/05/2022 0949   CREATININE 0.96 11/02/2014 0929      Component Value Date/Time   CALCIUM 9.2 05/05/2022 0949   CALCIUM 9.1 09/03/2013 0809   ALKPHOS 91 04/05/2022 0959   ALKPHOS 87 11/02/2014 0929   AST 24 04/05/2022 0959   AST 16 11/02/2014 0929   ALT 20 04/05/2022 0959   ALT 19 11/02/2014 0929   BILITOT 0.7 04/05/2022 0959   BILITOT 0.3 11/02/2014 0929       RADIOGRAPHIC STUDIES: I have personally reviewed the radiological  images as listed and agreed with the findings in the report. No results found.   ASSESSMENT & PLAN:  Carcinoma of upper-outer quadrant of right breast in female, estrogen receptor positive ([place]) # Right breast cancer stage I ER/PRPos; HER-2 negative. Currently OFF anastrozole [stopped in April, 2023]-;NOV 2022 [PCP];  # currently on EXEMESTANE 25 /mg day [starting- June 2nd, 2023]; noted to have worsening MSK pain; fatigue.   #MSK grade 2-secondary to adjuvant AI; recommend Osteo Bi-Flex.  #Fatigue: Recommend referral to care program.  # Myalgias- likely Sec to AI; Also ? lipitor 80 mg + Zetia 10 mg- defer to PCP/Cards [Dr.Arida]  # ? TIA-on monitor [April 2023] on asprin +plavix- STABLE.   # Bone density test-February 2023--normal limits continue calcium plus vitamin D. Discuss.   mychart # DISPOSITION: # CARE program re: fatigue cancer treatmenet # Follow up in 6 weeks- MD; no labs-  Dr.B    Orders Placed This Encounter   Procedures   AMB REFERRAL TO [facility] CARE    Referral Priority:   Routine    Referral Type:   Consultation    Referred to Provider:   Irving Shows, RN    Number of Visits Requested:   1   All questions were answered. The patient knows to call the clinic with any problems, questions or concerns.      Cammie Sickle, MD 06/21/2022 10:51 PM

## 2022-06-12 NOTE — Patient Instructions (Signed)
#  Recommend Osteo Bi-Flex-for joint pains over-the-counter/

## 2022-06-12 NOTE — Assessment & Plan Note (Addendum)
#  Right breast cancer stage I ER/PRPos; HER-2 negative. currently on EXEMESTANE 25 /mg day [starting- June 2nd, 2023]; noted to have worsening MSK pain; fatigue; wants to go back to Anastrazole. [sec to price]   # MSK grade 2-secondary to adjuvant AI; continue Osteo Bi-Flex.  # Myalgias- likely Sec to AI; Also ? lipitor 80 mg + Zetia 10 mg- defer to PCP/Cards [Dr.Arida]  # ? TIA-on monitor [April 2023] on asprin +plavix- STABLE.   # Bone density test-February 2023--normal limits continue calcium plus vitamin D. Discuss.   mychart  # DISPOSITION: # Follow up in 6 months- MD; no labs-  Dr.B  

## 2022-06-13 ENCOUNTER — Other Ambulatory Visit: Payer: Self-pay | Admitting: Family Medicine

## 2022-06-20 ENCOUNTER — Other Ambulatory Visit: Payer: Self-pay | Admitting: Family Medicine

## 2022-06-20 DIAGNOSIS — K2100 Gastro-esophageal reflux disease with esophagitis, without bleeding: Secondary | ICD-10-CM

## 2022-06-21 ENCOUNTER — Other Ambulatory Visit: Payer: Self-pay | Admitting: Surgery

## 2022-06-21 DIAGNOSIS — M7582 Other shoulder lesions, left shoulder: Secondary | ICD-10-CM

## 2022-06-30 ENCOUNTER — Ambulatory Visit
Admission: RE | Admit: 2022-06-30 | Discharge: 2022-06-30 | Disposition: A | Discharge status: Home / Self Care | Payer: Medicare Other | Source: Ambulatory Visit | Attending: Surgery | Admitting: Surgery

## 2022-06-30 DIAGNOSIS — S46012A Strain of muscle(s) and tendon(s) of the rotator cuff of left shoulder, initial encounter: Secondary | ICD-10-CM | POA: Diagnosis not present

## 2022-06-30 DIAGNOSIS — R296 Repeated falls: Secondary | ICD-10-CM | POA: Diagnosis not present

## 2022-06-30 DIAGNOSIS — M25412 Effusion, left shoulder: Secondary | ICD-10-CM | POA: Diagnosis not present

## 2022-06-30 DIAGNOSIS — M19012 Primary osteoarthritis, left shoulder: Secondary | ICD-10-CM | POA: Diagnosis not present

## 2022-06-30 DIAGNOSIS — M7582 Other shoulder lesions, left shoulder: Secondary | ICD-10-CM | POA: Diagnosis not present

## 2022-07-10 ENCOUNTER — Encounter: Payer: Self-pay | Admitting: Family Medicine

## 2022-07-10 ENCOUNTER — Ambulatory Visit (INDEPENDENT_AMBULATORY_CARE_PROVIDER_SITE_OTHER): Payer: Medicare Other | Admitting: Family Medicine

## 2022-07-10 DIAGNOSIS — M545 Low back pain, unspecified: Secondary | ICD-10-CM | POA: Diagnosis not present

## 2022-07-10 DIAGNOSIS — C50411 Malignant neoplasm of upper-outer quadrant of right female breast: Secondary | ICD-10-CM

## 2022-07-10 DIAGNOSIS — G459 Transient cerebral ischemic attack, unspecified: Secondary | ICD-10-CM

## 2022-07-10 DIAGNOSIS — I10 Essential (primary) hypertension: Secondary | ICD-10-CM | POA: Diagnosis not present

## 2022-07-10 DIAGNOSIS — Z170 Estrogen receptor positive status [ER+]: Secondary | ICD-10-CM

## 2022-07-10 DIAGNOSIS — C4499 Other specified malignant neoplasm of skin, unspecified: Secondary | ICD-10-CM | POA: Diagnosis not present

## 2022-07-10 DIAGNOSIS — S46012D Strain of muscle(s) and tendon(s) of the rotator cuff of left shoulder, subsequent encounter: Secondary | ICD-10-CM

## 2022-07-10 NOTE — Assessment & Plan Note (Signed)
She will follow-up with her surgeon as planned.

## 2022-07-10 NOTE — Assessment & Plan Note (Signed)
Has improved some with physical therapy.  I encouraged her to continue with physical therapy exercises.  She can continue Naprosyn 1 tablet twice daily as needed

## 2022-07-10 NOTE — Assessment & Plan Note (Signed)
Discussed that her insurance would not .  pay for nutritionist unless she had diabetes or chronic kidney disease.  Discussed we could potentially send her to the residency program in [place] where she could have access to a nutritionist while seeing a physician there though she is unsure about doing that.  She will let me know if she changes her mind.

## 2022-07-10 NOTE — Assessment & Plan Note (Addendum)
I encouraged her to contact her surgeon's office to see what the next steps would be in getting her surgery set up.  Discussed that her surgeon and her orthopedist may be able to help her decide which of those 2 surgeries to have first.

## 2022-07-10 NOTE — Patient Instructions (Signed)
Nice to see you. Please let me know if you would like to see a nutritionist in the future. Please contact Dr. Theora Gianotti to see what the next step in management of your extramammary Paget's would be.

## 2022-07-10 NOTE — Assessment & Plan Note (Signed)
ZIO monitor did not reveal any A-fib or a flutter.  No cardiac cause found for this.  Continue risk factor management.

## 2022-07-10 NOTE — Progress Notes (Signed)
Bailey Rumps, MD Phone: [phone]  Bailey Brooks is a 71 y.o. female who presents today for follow-up.  Hypertension: Typically around 130/78.  Taking lisinopril.  No chest pain or shortness of breath.  Left rotator cuff tear: Notes pain in her left shoulder after a fall.  She saw orthopedics and had a MRI that revealed a rotator cuff tear.  She follows up with them later this month.  Extramammary Paget's: Patient notes her surgeon wanted to do another surgery with a skin graft.  The patient did not end up getting this scheduled.  Chronic back pain: Patient notes this has improved with physical therapy that was done through orthopedics.  She continues on naproxen.  Notes her back pain is better than it used to be.  Breast cancer: Patient started on Aromasin.  She denies it has not been causing as many aches.  TIA: Patient had a heart monitor placed.  They did not find any atrial fibrillation.  She had a couple runs of SVT that were very brief and were asymptomatic.  She saw cardiology for follow-up and they advised against placing her on any medication for those runs of SVT.  Obesity: Patient notes she tried to work on dietary changes on her own.  She was able to cut out soda.  Social History   Tobacco Use  Smoking Status Never  Smokeless Tobacco Never    Current Outpatient Medications on File Prior to Visit  Medication Sig Dispense Refill   aspirin EC 81 MG tablet Take 81 mg by mouth daily.     atorvastatin (LIPITOR) 80 MG tablet TAKE 1 TABLET(80 MG) BY MOUTH DAILY 90 tablet 3   cholecalciferol (VITAMIN D3) 25 MCG (1000 UT) tablet Take 1,000 Units by mouth daily.     diazepam (VALIUM) 2 MG tablet Take 1 tablet 60 minutes prior to MRI scan and one tablet at time of MRI if needed for anxiety.     exemestane (AROMASIN) 25 MG tablet Take 1 tablet (25 mg total) by mouth daily after breakfast. 30 tablet 3   ezetimibe (ZETIA) 10 MG tablet TAKE 1 TABLET(10 MG) BY MOUTH DAILY 90  tablet 3   ketoconazole (NIZORAL) 2 % cream Apply twice daily as directed. 60 g 2   lansoprazole (PREVACID) 30 MG capsule TAKE 1 CAPSULE BY MOUTH EVERY DAY 90 capsule 3   levothyroxine (SYNTHROID) 88 MCG tablet Take 1 tablet (88 mcg total) by mouth every morning. on an empty stomach 90 tablet 3   lisinopril (ZESTRIL) 40 MG tablet Take 1 tablet (40 mg total) by mouth daily. 90 tablet 3   mometasone (ELOCON) 0.1 % cream Apply to itchy spots on body 1-2 times until improved. Avoid face, groin, underarms. 50 g 3   Multiple Vitamin (MULTIVITAMIN) tablet Take 1 tablet by mouth daily.     naproxen (NAPROSYN) 500 MG tablet TAKE ONE TABLET BY MOUTH TWICE DAILY AS NEEDED FOR MODERATE PAIN. TAKE WITH FOOD. 30 tablet 0   Specialty Vitamins Products (ICAPS LUTEIN & ZEAXANTHIN PO) Take by mouth every morning. Zeaxanthin '4mg'$ /Lutein '10mg'$      tacrolimus (PROTOPIC) 0.1 % ointment Apply 1-2 times daily as needed for itch to affected areas. 60 g 2   No current facility-administered medications on file prior to visit.     ROS see history of present illness  Objective  Physical Exam Vitals:   07/10/22 1407  BP: 120/80  Pulse: 78  Temp: 98.7 F (37.1 C)  SpO2: 95%  BP Readings from Last 3 Encounters:  07/10/22 120/80  06/12/22 (!) 157/85  05/26/22 138/90   Wt Readings from Last 3 Encounters:  07/10/22 237 lb 6.4 oz (107.7 kg)  06/12/22 234 lb 3.2 oz (106.2 kg)  05/26/22 239 lb 8 oz (108.6 kg)    Physical Exam Constitutional:      General: She is not in acute distress.    Appearance: She is not diaphoretic.  Cardiovascular:     Rate and Rhythm: Normal rate and regular rhythm.     Heart sounds: Normal heart sounds.  Pulmonary:     Effort: Pulmonary effort is normal.     Breath sounds: Normal breath sounds.  Skin:    General: Skin is warm and dry.  Neurological:     Mental Status: She is alert.      Assessment/Plan: Please see individual problem list.  Problem List Items Addressed  This Visit     Essential hypertension (Chronic)    Well-controlled.  Continue lisinopril 40 mg daily.      Low back pain (Chronic)    Has improved some with physical therapy.  I encouraged her to continue with physical therapy exercises.  She can continue Naprosyn 1 tablet twice daily as needed      Morbid obesity due to excess calories (HCC) (Chronic)    Discussed that her insurance would not .  pay for nutritionist unless she had diabetes or chronic kidney disease.  Discussed we could potentially send her to the residency program in [place] where she could have access to a nutritionist while seeing a physician there though she is unsure about doing that.  She will let me know if she changes her mind.      Paget disease, extra mammary (Chronic)    I encouraged her to contact her surgeon's office to see what the next steps would be in getting her surgery set up.  Discussed that her surgeon and her orthopedist may be able to help her decide which of those 2 surgeries to have first.      Relevant Medications   diazepam (VALIUM) 2 MG tablet   TIA (transient ischemic attack) (Chronic)    ZIO monitor did not reveal any A-fib or a flutter.  No cardiac cause found for this.  Continue risk factor management.      Carcinoma of upper-outer quadrant of right breast in female, estrogen receptor positive ([place])    She will continue to follow with her oncologist.      Relevant Medications   diazepam (VALIUM) 2 MG tablet   Rotator cuff tear    She will follow-up with her surgeon as planned.       Return in about 3 months (around 10/10/2022) for Prediabetes.   Bailey Rumps, MD [place]

## 2022-07-10 NOTE — Assessment & Plan Note (Signed)
She will continue to follow with her oncologist.

## 2022-07-10 NOTE — Assessment & Plan Note (Signed)
Well-controlled.  Continue lisinopril 40 mg daily.

## 2022-07-19 ENCOUNTER — Telehealth: Payer: Self-pay | Admitting: Nurse Practitioner

## 2022-07-19 NOTE — Telephone Encounter (Signed)
Spoke to patient. Pagets surgery on hold until she has rotator cuff surgery which takes priority. Dr. Theora Gianotti agrees.

## 2022-07-19 NOTE — Telephone Encounter (Signed)
Called patient to f/u on concerns for possible pagets surgery vs rotator cuff surgery. No answer. Left vm.

## 2022-07-23 NOTE — Progress Notes (Signed)
[place] OFFICE PROGRESS NOTE  Patient Care Team: Leone Haven, MD as PCP - General (Family Medicine) Wellington Hampshire, MD as PCP - Cardiology (Cardiology) Clent Jacks, RN as Registered Nurse Cammie Sickle, MD as Consulting Physician (Oncology)   Cancer Staging  No matching staging information was found for the patient.   Oncology History Overview Note  # RIGHT BREAST CANCER stage I [pTapN0; G-1; ER-100%; PR-99%; Her-2-NEG; Dr.Sakai]. NO Oncotype;s/p RT [12/29/2019]  # MARCH 1st 2021- Anastrzole;  STOPPED anastrozole [stopped in April, 2023]; June 2nd, 2023- Examestane 25 mg/day.     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   DIAGNOSIS: Right breast  STAGE:   I      ;  GOALS: cure    History of lymphoma  Carcinoma of upper-outer quadrant of right breast in female, estrogen receptor positive  10/28/2019 Initial Diagnosis   Carcinoma of upper-outer quadrant of right breast in female, estrogen receptor positive ([place])   03/20/2020 Genetic Testing   Negative genetic testing on the multi-cancer panel.  The Multi-Gene Panel offered by [facility] includes sequencing and/or deletion duplication testing of the following 85 genes: AIP, ALK, APC, ATM, AXIN2,BAP1,  BARD1, BLM, BMPR1A, BRCA1, BRCA2, BRIP1, CASR, CDC73, CDH1, CDK4, CDKN1B, CDKN1C, CDKN2A (p14ARF), CDKN2A (p16INK4a), CEBPA, CHEK2, CTNNA1, DICER1, DIS3L2, EGFR (c.2369C>T, p.Thr790Met variant only), EPCAM (Deletion/duplication testing only), FH, FLCN, GATA2, GPC3, GREM1 (Promoter region deletion/duplication testing only), HOXB13 (c.251G>A, p.Gly84Glu), HRAS, KIT, MAX, MEN1, MET,  MITF (c.952G>A, p.Glu318Lys variant only), MLH1, MSH2, MSH3, MSH6, MUTYH, NBN, NF1, NF2, NTHL1, PALB2, PDGFRA, PHOX2B, PMS2, POLD1, POLE, POT1, PRKAR1A, PTCH1, PTEN, RAD50, RAD51C, RAD51D, RB1, RECQL4, RET, RNF43, RUNX1, SDHAF2, SDHA (sequence changes only), SDHB, SDHC, SDHD, SMAD4, SMARCA4, SMARCB1, SMARCE1, STK11, SUFU, TERC, TERT, TMEM127, TP53, TSC1, TSC2, VHL, WRN and WT1.  The report date is March 20, 2020.       INTERVAL HISTORY: Ambulating independently.  Alone.   Bailey Brooks 71 y.o.  female pleasant patient stage I ER/PR positive HER-2 negative breast cancer most r currently on adjuvant exemestane is here for follow-up.  Patient complains of mild to moderate worsening of her joint pains.  Also complains of myalgias. Denies any falls.  No nausea vomiting.  Admits to compliance with her exemestane.  Review of Systems  Constitutional:  Positive for malaise/fatigue. Negative for chills, diaphoresis and fever.  HENT:  Negative for nosebleeds and sore throat.   Eyes:  Negative for double vision.  Respiratory:  Negative for cough, hemoptysis, sputum production, shortness of breath and wheezing.   Cardiovascular:  Negative for chest pain, palpitations, orthopnea and leg swelling.  Gastrointestinal:  Negative for abdominal pain, blood in stool, constipation, diarrhea, heartburn, melena, nausea and vomiting.  Genitourinary:  Negative for dysuria, frequency and urgency.  Musculoskeletal:  Positive for back pain and joint pain.  Skin: Negative.  Negative for itching and rash.  Neurological:  Negative for dizziness, tingling, focal weakness, weakness and headaches.  Endo/Heme/Allergies:  Does not bruise/bleed easily.  Psychiatric/Behavioral:  Negative for depression. The patient is not nervous/anxious and does not have insomnia.       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   Hypercholesterolemia    Hypertension    Hypothyroidism    Long term current use of aromatase inhibitor    Morbid obesity ([place])    Ommaya reservoir present 2012   a.) placed for intrathecal chemotherapy; remains in place as of 11/2022   Paget's disease of vulva ([place]) 05/13/2013   a.) Bx (+) IPN of vulva consistent with Pagets; resected; b.) s/p re-excision 09/29/2015   Pre-diabetes    Syncope    TIA (transient ischemic attack)    a.) s/p TIA 01/02/2011; b.) s/p TIA 02/10/2017; c.) s/p TIA 04/02/2022; d.) 02/2017 event monitor: No significant arrhythmias.   Tubular adenoma of colon     PAST SURGICAL HISTORY :   Past Surgical History:  Procedure Laterality Date   ABDOMINAL HYSTERECTOMY  1985   Hysterectomy-partial   BREAST BIOPSY Right 2002   Neg - AT [facility]   BREAST BIOPSY  Right 2020   bx done at [facility]?, IDC and DCIS   BREAST LUMPECTOMY Right 10/16/2019   IDC and DCIS, negative LN   BURR HOLE W/ PLACEMENT OMMAYA RESERVOIR     BURR HOLE W/ PLACEMENT OMMAYA RESERVOIR  2012   COLONOSCOPY  04/2013   ESOPHAGOGASTRODUODENOSCOPY  04/2013   INJECTION KNEE  07/02/2018   INSERTION CENTRAL VENOUS ACCESS DEVICE W/ SUBCUTANEOUS PORT  2011   Port a Cath: Right chest Double Lumen, 04-Nov-2010   LIMBAL STEM CELL TRANSPLANT  2012   LIVER BIOPSY     stage 4B large Bcell lymphoma   PARTIAL MASTECTOMY WITH NEEDLE LOCALIZATION AND AXILLARY SENTINEL LYMPH NODE BX Right 10/16/2019   Procedure: PARTIAL MASTECTOMY WITH NEEDLE LOCALIZATION AND AXILLARY SENTINEL LYMPH NODE BX;  Surgeon: Benjamine Sprague, DO;  Location: [facility] ORS;  Service: General;  Laterality: Right;   REVERSE SHOULDER ARTHROPLASTY Left 11/14/2022   Procedure: REVERSE SHOULDER ARTHROPLASTY WITH BICEPS TENODESIS.;  Surgeon: Corky Mull, MD;  Location: [facility] ORS;  Service: Orthopedics;  Laterality: Left;   ROTATOR CUFF REPAIR Right 2016   SHOULDER ARTHROSCOPY Right 06/22/2015   Procedure: ARTHROSCOPY SHOULDER, parital repair of rotator cuff, biceps tenodesis, decompression and debridement;  Surgeon: Corky Mull, MD;  Location: [facility] ORS;  Service: Orthopedics;  Laterality: Right;   [place] PARTIAL N/A 09/29/2015   Procedure: VULVECTOMY PARTIAL;  Surgeon: Gillis Ends, MD;  Location: [facility] ORS;  Service: Gynecology;  Laterality: N/A;    FAMILY HISTORY :   Family History  Problem Relation Age of Onset   Diabetes Mother        died @ 46 of MI.   Hypertension Mother    Heart attack Mother    Melanoma Father 55       died of complications r/t melanoma w/ lung mets.   Kidney disease Sister        Kidney removed    Breast cancer Sister 47   Prostate cancer Brother        Prostate - dx in 76's   Heart disease Brother 60       reported MI @ age 53, ? treated w/ TPA->no  recurrent CAD, now in 67's.   Hearing loss Maternal Aunt    Cancer Maternal Aunt        pancreatic vs colon cancer   Hearing loss Maternal Grandfather    Other Maternal Grandmother        flu pandemic   Kidney cancer Niece 43       partial nephrectomy    SOCIAL HISTORY:   Social History   Tobacco Use   Smoking status: Never   Smokeless tobacco: Never  Vaping Use   Vaping Use: Never used  Substance Use Topics   Alcohol use: No    Alcohol/week: 0.0 standard drinks of alcohol   Drug use: No    ALLERGIES:  is allergic to morphine and related, sulfa antibiotics, iodine, adhesive [tape], erythromycin, and oxycodone.  MEDICATIONS:  Current Outpatient Medications  Medication Sig Dispense Refill   cholecalciferol (VITAMIN D3) 25 MCG (1000 UT) tablet Take 1,000 Units by mouth daily.     ketoconazole (NIZORAL) 2 % cream Apply twice daily as directed. (Patient taking differently: 1 Application daily as needed for irritation. Apply twice daily as directed.) 60 g 2   lansoprazole (PREVACID) 30 MG capsule TAKE 1 CAPSULE BY MOUTH EVERY DAY (Patient taking differently: Take 30 mg by mouth every morning. TAKE 1 CAPSULE BY MOUTH EVERY DAY) 90 capsule 3   lisinopril (ZESTRIL) 40 MG tablet Take 1 tablet (40 mg total) by mouth daily. (Patient taking differently: Take 40 mg by mouth every morning.) 90 tablet 3   mometasone (ELOCON) 0.1 % cream Apply to itchy spots on body 1-2 times until improved. Avoid face, groin, underarms. (Patient taking differently: Apply 1 Application topically as needed. Apply to itchy spots on body 1-2 times until improved. Avoid face, groin, underarms.) 50 g 3   Multiple Vitamin (MULTIVITAMIN) tablet Take 1 tablet by mouth daily.     Specialty Vitamins Products (ICAPS LUTEIN & ZEAXANTHIN PO) Take 1 tablet by mouth every morning. Zeaxanthin 4mg /Lutein 10mg      tacrolimus (PROTOPIC) 0.1 % ointment Apply 1-2 times daily as needed for itch to affected areas. (Patient taking  differently: 1 Application as needed. Apply 1-2 times daily as needed for itch to affected areas.) 60 g 2   acetaminophen (TYLENOL) 500 MG tablet Take 1,000 mg by mouth daily at 6 (six) AM.     anastrozole (ARIMIDEX) 1 MG tablet TAKE ONE TABLET BY MOUTH DAILY 90 tablet 1   aspirin EC 81 MG tablet Take 81 mg by mouth daily. Swallow whole.  atorvastatin (LIPITOR) 80 MG tablet TAKE 1 TABLET BY MOUTH DAILY (Patient taking differently: Take 80 mg by mouth every morning. TAKE 1 TABLET BY MOUTH DAILY) 90 tablet 3   ezetimibe (ZETIA) 10 MG tablet TAKE ONE TABLET BY MOUTH EVERY DAY (Patient taking differently: Take 10 mg by mouth every morning. TAKE ONE TABLET BY MOUTH EVERY DAY) 90 tablet 3   hydrochlorothiazide (HYDRODIURIL) 12.5 MG tablet Take 1 tablet (12.5 mg total) by mouth daily. 90 tablet 3   levothyroxine (SYNTHROID) 88 MCG tablet TAKE 1 TABLET EVERY DAY ON EMPTY STOMACHWITH A GLASS OF WATER AT LEAST 30-60 MINBEFORE BREAKFAST 90 tablet 3   naproxen (NAPROSYN) 500 MG tablet TAKE ONE TABLET BY MOUTH TWICE DAILY AS NEEDED FOR MODERATE PAIN.  TAKE WITH FOOD 90 tablet 1   No current facility-administered medications for this visit.   PHYSICAL EXAMINATION: ECOG PERFORMANCE STATUS: 0 - Asymptomatic  BP (!) 169/87 (BP Location: Right Arm, Patient Position: Sitting, Cuff Size: Normal)   Pulse 82   Temp 99.8 F (37.7 C) (Tympanic)   Ht 5\' 3"  (1.6 m)   Wt 240 lb 6.4 oz (109 kg)   SpO2 97%   BMI 42.58 kg/m   Filed Weights   07/24/22 1016  Weight: 240 lb 6.4 oz (109 kg)    Physical Activity: Unknown (03/10/2021)   Exercise Vital Sign    Days of Exercise per Week: 0 days    Minutes of Exercise per Session: Not on file   Physical Exam HENT:     Head: Normocephalic and atraumatic.     Mouth/Throat:     Pharynx: No oropharyngeal exudate.  Eyes:     Pupils: Pupils are equal, round, and reactive to light.  Cardiovascular:     Rate and Rhythm: Normal rate and regular rhythm.  Pulmonary:      Effort: No respiratory distress.     Breath sounds: No wheezing.  Abdominal:     General: Bowel sounds are normal. There is no distension.     Palpations: Abdomen is soft. There is no mass.     Tenderness: There is no abdominal tenderness. There is no guarding or rebound.  Musculoskeletal:        General: No tenderness. Normal range of motion.     Cervical back: Normal range of motion and neck supple.  Skin:    General: Skin is warm.  Neurological:     Mental Status: She is alert and oriented to person, place, and time.  Psychiatric:        Mood and Affect: Affect normal.    LABORATORY DATA:  I have reviewed the data as listed    Component Value Date/Time   NA 140 02/21/2023 1017   NA 138 09/03/2013 0809   K 4.2 02/21/2023 1017   K 4.3 11/02/2014 0932   CL 101 02/21/2023 1017   CL 105 09/03/2013 0809   CO2 31 02/21/2023 1017   CO2 28 09/03/2013 0809   GLUCOSE 123 (H) 02/21/2023 1017   GLUCOSE 100 (H) 09/03/2013 0809   BUN 28 (H) 02/21/2023 1017   BUN 17 09/03/2013 0809   CREATININE 0.89 02/21/2023 1017   CREATININE 0.96 11/02/2014 0929   CALCIUM 9.5 02/21/2023 1017   CALCIUM 9.1 09/03/2013 0809   PROT 7.3 11/06/2022 1546   PROT 6.6 11/02/2014 0929   ALBUMIN 3.8 11/06/2022 1546   ALBUMIN 3.3 (L) 11/02/2014 0929   AST 27 11/06/2022 1546   AST 16 11/02/2014 0929   ALT  22 11/06/2022 1546   ALT 19 11/02/2014 0929   ALKPHOS 94 11/06/2022 1546   ALKPHOS 87 11/02/2014 0929   BILITOT 0.8 11/06/2022 1546   BILITOT 0.3 11/02/2014 0929   GFRNONAA >60 11/15/2022 0144   GFRNONAA >60 11/02/2014 0929   GFRNONAA >60 07/27/2014 0836   GFRAA >60 07/05/2020 0923   GFRAA >60 11/02/2014 0929   GFRAA >60 07/27/2014 0836    No results found for: "SPEP", "UPEP"  Lab Results  Component Value Date   WBC 10.0 11/15/2022   NEUTROABS 3.5 11/06/2022   HGB 11.9 (L) 11/15/2022   HCT 36.5 11/15/2022   MCV 87.3 11/15/2022   PLT 188 11/15/2022      Chemistry      Component Value  Date/Time   NA 140 02/21/2023 1017   NA 138 09/03/2013 0809   K 4.2 02/21/2023 1017   K 4.3 11/02/2014 0932   CL 101 02/21/2023 1017   CL 105 09/03/2013 0809   CO2 31 02/21/2023 1017   CO2 28 09/03/2013 0809   BUN 28 (H) 02/21/2023 1017   BUN 17 09/03/2013 0809   CREATININE 0.89 02/21/2023 1017   CREATININE 0.96 11/02/2014 0929      Component Value Date/Time   CALCIUM 9.5 02/21/2023 1017   CALCIUM 9.1 09/03/2013 0809   ALKPHOS 94 11/06/2022 1546   ALKPHOS 87 11/02/2014 0929   AST 27 11/06/2022 1546   AST 16 11/02/2014 0929   ALT 22 11/06/2022 1546   ALT 19 11/02/2014 0929   BILITOT 0.8 11/06/2022 1546   BILITOT 0.3 11/02/2014 0929       RADIOGRAPHIC STUDIES: I have personally reviewed the radiological images as listed and agreed with the findings in the report. No results found.   ASSESSMENT & PLAN:  Carcinoma of upper-outer quadrant of right breast in female, estrogen receptor positive ([place]) # Right breast cancer stage I ER/PRPos; HER-2 negative. currently on EXEMESTANE 25 /mg day [starting- June 2nd, 2023]; noted to have worsening MSK pain; fatigue; wants to go back to Anastrazole. [sec to price]   # MSK grade 2-secondary to adjuvant AI; continue Osteo Bi-Flex.  # Myalgias- likely Sec to AI; Also ? lipitor 80 mg + Zetia 10 mg- defer to PCP/Cards [Dr.Arida]  # ? TIA-on monitor [April 2023] on asprin +plavix- STABLE.   # Bone density test-February 2023--normal limits continue calcium plus vitamin D. Discuss.   mychart  # DISPOSITION: # Follow up in 6 months- MD; no labs-  Dr.B    No orders of the defined types were placed in this encounter.  All questions were answered. The patient knows to call the clinic with any problems, questions or concerns.      Cammie Sickle, MD 03/06/2023 2:25 PM

## 2022-07-23 NOTE — Assessment & Plan Note (Signed)
#   Right breast cancer stage I ER/PRPos; HER-2 negative. currently on EXEMESTANE 25 /mg day [starting- June 2nd, 2023]; noted to have worsening MSK pain; fatigue; wants to go back to Anastrazole. [sec to price]   # MSK grade 2-secondary to adjuvant AI; continue Osteo Bi-Flex.  # Myalgias- likely Sec to AI; Also ? lipitor 80 mg + Zetia 10 mg- defer to PCP/Cards [Dr.Arida]  # ? TIA-on monitor [April 2023] on asprin +plavix- STABLE.   # Bone density test-February 2023--normal limits continue calcium plus vitamin D. Discuss.   mychart  # DISPOSITION: # Follow up in 6 months- MD; no labs-  Dr.B

## 2022-07-24 ENCOUNTER — Inpatient Hospital Stay: Payer: Medicare Other | Attending: Hematology and Oncology | Admitting: Internal Medicine

## 2022-07-24 ENCOUNTER — Encounter: Payer: Self-pay | Admitting: Internal Medicine

## 2022-07-24 DIAGNOSIS — Z8572 Personal history of non-Hodgkin lymphomas: Secondary | ICD-10-CM | POA: Diagnosis not present

## 2022-07-24 DIAGNOSIS — Z8051 Family history of malignant neoplasm of kidney: Secondary | ICD-10-CM | POA: Insufficient documentation

## 2022-07-24 DIAGNOSIS — M75122 Complete rotator cuff tear or rupture of left shoulder, not specified as traumatic: Secondary | ICD-10-CM | POA: Diagnosis not present

## 2022-07-24 DIAGNOSIS — Z90710 Acquired absence of both cervix and uterus: Secondary | ICD-10-CM | POA: Insufficient documentation

## 2022-07-24 DIAGNOSIS — M7582 Other shoulder lesions, left shoulder: Secondary | ICD-10-CM | POA: Diagnosis not present

## 2022-07-24 DIAGNOSIS — Z7982 Long term (current) use of aspirin: Secondary | ICD-10-CM | POA: Insufficient documentation

## 2022-07-24 DIAGNOSIS — Z79811 Long term (current) use of aromatase inhibitors: Secondary | ICD-10-CM | POA: Diagnosis not present

## 2022-07-24 DIAGNOSIS — C50411 Malignant neoplasm of upper-outer quadrant of right female breast: Secondary | ICD-10-CM | POA: Insufficient documentation

## 2022-07-24 DIAGNOSIS — Z8544 Personal history of malignant neoplasm of other female genital organs: Secondary | ICD-10-CM | POA: Diagnosis not present

## 2022-07-24 DIAGNOSIS — Z803 Family history of malignant neoplasm of breast: Secondary | ICD-10-CM | POA: Diagnosis not present

## 2022-07-24 DIAGNOSIS — I10 Essential (primary) hypertension: Secondary | ICD-10-CM | POA: Diagnosis not present

## 2022-07-24 DIAGNOSIS — Z8673 Personal history of transient ischemic attack (TIA), and cerebral infarction without residual deficits: Secondary | ICD-10-CM | POA: Insufficient documentation

## 2022-07-24 DIAGNOSIS — Z8042 Family history of malignant neoplasm of prostate: Secondary | ICD-10-CM | POA: Diagnosis not present

## 2022-07-24 DIAGNOSIS — Z170 Estrogen receptor positive status [ER+]: Secondary | ICD-10-CM | POA: Insufficient documentation

## 2022-07-24 MED ORDER — ANASTROZOLE 1 MG PO TABS: 1.0000 mg | ORAL_TABLET | Freq: Every day | ORAL | 1 refills | Status: DC

## 2022-08-07 ENCOUNTER — Other Ambulatory Visit: Payer: Self-pay | Admitting: Family Medicine

## 2022-08-07 DIAGNOSIS — E785 Hyperlipidemia, unspecified: Secondary | ICD-10-CM

## 2022-08-17 ENCOUNTER — Other Ambulatory Visit: Payer: Self-pay | Admitting: Family Medicine

## 2022-08-17 ENCOUNTER — Ambulatory Visit: Payer: Medicare Other | Admitting: Cardiovascular Disease

## 2022-08-17 DIAGNOSIS — E785 Hyperlipidemia, unspecified: Secondary | ICD-10-CM

## 2022-09-18 ENCOUNTER — Other Ambulatory Visit: Payer: Self-pay | Admitting: Family Medicine

## 2022-09-25 DIAGNOSIS — M7582 Other shoulder lesions, left shoulder: Secondary | ICD-10-CM | POA: Diagnosis not present

## 2022-09-25 DIAGNOSIS — M75122 Complete rotator cuff tear or rupture of left shoulder, not specified as traumatic: Secondary | ICD-10-CM | POA: Diagnosis not present

## 2022-09-27 ENCOUNTER — Ambulatory Visit: Payer: Medicare Other

## 2022-10-04 ENCOUNTER — Inpatient Hospital Stay: Payer: Medicare Other | Attending: Obstetrics and Gynecology | Admitting: Obstetrics and Gynecology

## 2022-10-04 VITALS — BP 181/85 | Temp 97.0°F | Ht 63.0 in | Wt 234.0 lb

## 2022-10-04 DIAGNOSIS — M549 Dorsalgia, unspecified: Secondary | ICD-10-CM | POA: Diagnosis not present

## 2022-10-04 DIAGNOSIS — Z8042 Family history of malignant neoplasm of prostate: Secondary | ICD-10-CM | POA: Insufficient documentation

## 2022-10-04 DIAGNOSIS — C4499 Other specified malignant neoplasm of skin, unspecified: Secondary | ICD-10-CM

## 2022-10-04 DIAGNOSIS — C519 Malignant neoplasm of vulva, unspecified: Secondary | ICD-10-CM | POA: Diagnosis present

## 2022-10-04 DIAGNOSIS — Z170 Estrogen receptor positive status [ER+]: Secondary | ICD-10-CM | POA: Diagnosis not present

## 2022-10-04 DIAGNOSIS — Z808 Family history of malignant neoplasm of other organs or systems: Secondary | ICD-10-CM | POA: Diagnosis not present

## 2022-10-04 DIAGNOSIS — Z79811 Long term (current) use of aromatase inhibitors: Secondary | ICD-10-CM | POA: Insufficient documentation

## 2022-10-04 DIAGNOSIS — Z90710 Acquired absence of both cervix and uterus: Secondary | ICD-10-CM | POA: Diagnosis not present

## 2022-10-04 DIAGNOSIS — I10 Essential (primary) hypertension: Secondary | ICD-10-CM | POA: Diagnosis not present

## 2022-10-04 DIAGNOSIS — Z8051 Family history of malignant neoplasm of kidney: Secondary | ICD-10-CM

## 2022-10-04 DIAGNOSIS — C50411 Malignant neoplasm of upper-outer quadrant of right female breast: Secondary | ICD-10-CM | POA: Diagnosis not present

## 2022-10-04 DIAGNOSIS — Z853 Personal history of malignant neoplasm of breast: Secondary | ICD-10-CM

## 2022-10-04 DIAGNOSIS — Z803 Family history of malignant neoplasm of breast: Secondary | ICD-10-CM | POA: Diagnosis not present

## 2022-10-04 NOTE — Progress Notes (Signed)
Gynecologic Oncology Interval Visit   Referring Provider: Dr. Kenton Kingfisher  Chief Concern: Recurrent paget's disease of the vulva  Subjective:  Bailey Brooks is a 71 y.o. female, diagnosed with Paget's of the vulva, s/p WLE and vulvar biopsy 09/29/2015, who returns to clinic to re-evaluation.   She had recurrent pagets in 2021 and elected for imiquimod. She completed imiquimod treatment on 08/14/20. Biopsies post imiquimod were negative. We discussed maintenance imiquimod vs monitoring as well as EUA, mapping, resection. She preferred to avoid invasive procedures when possible and elected for surveillance.   She continues surveillance for history of breast cancer, currently on anastrozole and followed by Dr. Rogue Bussing.   She has ongoing back pain and is followed by ortho. She denies any new gynecologic issues.  She specifically does not complain of vulvar irritation.  She is scheduled to have her shoulder surgery later this year.    Gynecologic Oncology History: Bailey Brooks has a history of localized vulvar Paget's disease.   04/2013- vulvar biopsy revealed Paget's disease               WLE, additional margins resected for positive disease on frozen evaluation.   Part A: VULVA, VAGINAL MARGIN:  - POSITIVE FOR EXTRAMAMMARY PAGET'S DISEASE  Part B: VULVA, POSTERIOR MARGIN:  - POSITIVE FOR EXTRAMAMMARY PAGET'S DISEASE  Part C: VULVA, RIGHT, PARTIAL VULVECTOMY:  - EXTRAMAMMARY PAGET'S DISEASE  Part D: VULVA, NEW VAGINAL MARGIN:  - POSITIVE FOR EXTRAMAMMARY PAGET'S DISEASE  07/28/2015 for routine surveillance an area on the right vulva seemed more suspicious for recurrence. This was biopsied and confirmed recurrent Paget's disease.   She underwent repeat excision 09/29/15 A. VULVA; EXCISION:  - RARE CYTOKERATIN 7 POSITIVE INTRAEPIDERMAL CELLS, INTERPRETED AS RESIDUAL PAGET DISEASE.  - THE SURGICAL MARGINS ARE CLEAR.   B. VULVA, 11:00; BIOPSY:  - RARE CYTOKERATIN 7 POSITIVE  INTRAEPIDERMAL CELLS, INTERPRETED AS PAGET DISEASE.   No additional issues since surgery.   She had vulvar biopsy on 05/09/17 which was negative:   DIAGNOSIS:  A. VULVA; BIOPSY:  - SKIN WITH FOCAL KERATOSIS AND HYPERGRANULOSIS - NEGATIVE FOR DYSPLASIA AND MALIGNANCY  Of note, she has had a h/o recurrent vulvar candidiasis. Prior to her vulvar surgery 09/2015 she received 5 weekly doses of oral diflucan for vulvar candidiasis. Postop she was treated for yeast infection again in 12/17 and then took one Diflucan a week for six months with good results.   Seen in gyn-onc clinic on 01/16/18 by Dr. Theora Gianotti. NED at that time.   She has swelling of her lower extremities which was evaluated by her PCP and thought to be related to amlodipine. She started lasix which has improved her symptoms. She was seen by Dr. 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   She saw Dr. Theora Gianotti on 01/29/2019 with symptoms concerning for candidiasis at that time but no evidence of recurrent disease.   Her blood pressure medications were recently changes due to swelling secondary to amlodipine. No on lisinopril.   Previous biopsies were consistent with recurrent Paget's.   DIAGNOSIS:  A. VULVA, LEFT AT 3:00; PUNCH BIOPSY:  - BENIGN VULVAR TISSUE.  - NEGATIVE FOR DYSPLASIA AND MALIGNANCY.   DIAGNOSIS:  A. VULVA, RIGHT AT 7:00; PUNCH BIOPSY:  - EXTRAMAMMARY PAGET'S DISEASE OF THE VULVA.  - NEGATIVE FOR DYSPLASIA AND MALIGNANCY.   In the interim she has completed surgery and radiation for stage I right breast cancer, ER/PR positive  HER-2/neu negative. She completed radiation on 12/29/2019. Anastrozole was recommended and she plans to start in approximately late February.   At last visit we had recommended start Aldara but due to recent diagnosis of breast cancer at that time, patient requested to wait to start treatment and returns today to discuss options for  management and consider starting Aldara. She is concerned about reaching site to be treated and tolerability. Her daughter-in-law is going to help her with application. Previously she had expressed desire to avoid surgery/Mohs.   01/16/20 started Imiquimod  completed 5 weeks of treatment then held 02/18/2020 due to ulceration, erythema, and pain. Restarted on 03/17/2020. We recommended to continue imiquimod for 16 weeks and add clobetasol, 0.05% cream to vulva on alternating days that she does not use imiquimod to help with inflammation and reduce risk of recurrent ulcer/skin breakdown. Completed treatment on 08/14/2020.   08/14/20 She completed imiquimod treatment. At last appointment, post imiquimod, she underwent biopsy x 2 which were negative for Paget's. We discussed maintenance imiquimod vs monitoring. She elected close surveillance.   08/25/20 DIAGNOSIS:  A. VULVA, RIGHT; BIOPSY:  - BENIGN SQUAMOUS MUCOSA WITH MILD CHRONIC INFLAMMATION.  - NEGATIVE FOR DYSPLASIA AND MALIGNANCY.  - NEGATIVE FOR ACTIVE INFLAMMATION AND FUNGAL ELEMENTS.   B. VULVA, LEFT; BIOPSY:  - BENIGN SQUAMOUS MUCOSA WITH MILD CHRONIC INFLAMMATION.  - NEGATIVE FOR DYSPLASIA AND MALIGNANCY.  - NEGATIVE FOR ACTIVE INFLAMMATION AND FUNGAL ELEMENTS.   Comment:  The history of extramammary Paget's disease is noted. Immunohistochemical stain for CK7 (blocks A1 and B1) is negative.   Patient had vasovagal episode secondary to straining to have BM in September 2021 and was seen in ER. Concerned lidocaine for biopsy could have contributed. She was seen in ER and had negative head ct. Left frontal ventriculoperitoneal shunt was stable on imaging.    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 04/24/2022   Lumbar spondylosis 04/24/2022   TIA (transient ischemic attack) 04/02/2022   Yeast vaginitis 02/06/2022   Abnormal  urinalysis 01/11/2022   Fall 01/03/2022   Left shoulder pain 01/03/2022   Contusion of left hip 12/09/2021   Rotator cuff tear 12/09/2021   Syncope 09/03/2020   Low serum vitamin B12 05/28/2020   Fatigue 03/24/2020   Genetic testing 03/23/2020   Family history of breast cancer    Family history of prostate cancer    Family history of melanoma    Carcinoma of upper-outer quadrant of right breast in female, estrogen receptor positive ([place]) 10/28/2019   Stress 08/06/2019   Breast cancer screening 08/06/2019   Pain due to onychomycosis of toenails of both feet 06/09/2019   Prediabetes 12/20/2018   Macular degeneration 06/20/2018   GERD (gastroesophageal reflux disease) 06/20/2018   Hypersomnia 06/20/2018   Bruising 12/14/2017   History of lymphoma 06/11/2017   Vulvar lesion 05/16/2017   Low back pain 04/03/2017   History of transient ischemic attack (TIA) 02/10/2017   Hypokalemia 02/10/2017   Left hip pain 08/03/2016   Essential hypertension 07/06/2016   Hypomagnesemia 03/02/2016   Morbid obesity due to excess calories ([place]) 03/02/2016   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   Adjustment disorder with mixed anxiety and depressed mood 05/25/2015   Paget disease, 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   Eczema 02/27/2013   Muscle cramps 11/13/2012   Hypothyroidism 02/20/2012    Past Medical History: Past Medical History:  Diagnosis Date   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 11/2016 Echo: EF 60-65%, no rwma, mild LVH, Gr1 DD, triv MR, mildly dil LA, nl RV fxn; b. 03/2022 Echo: EF 60-65%, no rwma, nl RV fxn, mild MR.   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 Madelynn Done s/p RCHOP and methotrexate, c/b renal failure   Diverticulosis    Esophagitis    Family history  of breast cancer    Family history of melanoma    Family history of prostate cancer    GERD (gastroesophageal reflux disease)    H/O stem cell transplant ([place]) 06/2011   a. in setting of lymphoma.   Hemorrhoids    History of chemotherapy 22-Oct-2010   RHCOP/methotrexate-intrathecal   History of stress test    a. 05/2010 Myoview: nl EF, no ischemia/infarct.   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 2014 s/p resection.   Personal history of radiation therapy 2020-2021   right breast ca   Syncope    TIA (transient ischemic attack)    a. X 2 - last in 2018; b. 02/2017 event monitor: No significant arrhythmias.    Past Surgical History: Past Surgical History:  Procedure Laterality Date   ABDOMINAL HYSTERECTOMY  1985   Hysterectomy-partial   BREAST BIOPSY Right 2002   Neg - AT [facility]   BREAST BIOPSY Right 2020   bx done at [facility]?, IDC and DCIS   BREAST LUMPECTOMY Right 10/16/2019   IDC and DCIS, negative LN   BURR HOLE W/ PLACEMENT OMMAYA RESERVOIR     BURR HOLE W/ PLACEMENT OMMAYA RESERVOIR  2012   COLONOSCOPY  04/2013   ESOPHAGOGASTRODUODENOSCOPY  04/2013   INJECTION KNEE  07/02/2018   INSERTION CENTRAL VENOUS ACCESS DEVICE W/ SUBCUTANEOUS PORT  2011   Port a Cath: Right chest Double Lumen, 04-Nov-2010   LIMBAL STEM CELL TRANSPLANT  2012   LIVER BIOPSY     stage 4B large Bcell lymphoma   PARTIAL MASTECTOMY WITH NEEDLE LOCALIZATION AND AXILLARY SENTINEL LYMPH NODE BX Right 10/16/2019   Procedure: PARTIAL MASTECTOMY WITH NEEDLE LOCALIZATION AND AXILLARY SENTINEL LYMPH NODE BX;  Surgeon: Benjamine Sprague, DO;  Location: [facility] ORS;  Service: General;  Laterality: Right;   ROTATOR CUFF REPAIR Right 2016   SHOULDER ARTHROSCOPY Right 06/22/2015   Procedure: ARTHROSCOPY SHOULDER, parital repair of rotator cuff, biceps tenodesis, decompression and debridement;   Surgeon: Corky Mull, MD;  Location: [facility] ORS;  Service: Orthopedics;  Laterality: Right;   [facility] PARTIAL N/A 09/29/2015   Procedure: VULVECTOMY PARTIAL;  Surgeon: Gillis Ends, MD;  Location: [facility] ORS;  Service: Gynecology;  Laterality: N/A;    Past Gynecologic History:  S/p hysterectomy  OB History:  OB History  No obstetric history on file.    Family History: Family History  Problem Relation Age of Onset   Diabetes Mother        died @ 23 of MI.   Hypertension Mother    Heart attack Mother  Melanoma Father 76       died of complications r/t melanoma w/ lung mets.   Kidney disease Sister        Kidney removed    Breast cancer Sister 29   Prostate cancer Brother        Prostate - dx in 21's   Heart disease Brother 66       reported MI @ age 41, ? treated w/ TPA->no recurrent CAD, now in 11's.   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  No pets.  Retired from [employer].  Activity limited by  chronic back pain-sedentary.   Right-handed   Caffeine: occasional caffeine free/diet soda or hot tea   Social Determinants of Health   Financial Resource Strain: Low Risk  (03/13/2022)   Overall Financial Resource Strain (CARDIA)    Difficulty of Paying Living Expenses: Not hard at all  Food Insecurity: No Food Insecurity (03/13/2022)   Hunger Vital Sign    Worried About Running Out of Food in the Last Year: Never true    Ran Out of Food in the Last Year: Never true  Transportation Needs: No Transportation Needs (03/13/2022)   PRAPARE - Hydrologist (Medical): No    Lack of Transportation (Non-Medical): No  Physical Activity: Unknown (03/10/2021)   Exercise Vital Sign    Days of Exercise per Week: 0 days    Minutes of Exercise per Session: Not on file  Stress: No Stress Concern Present (03/13/2022)   [place]    Feeling of Stress : Only a little  Social Connections: Unknown (03/13/2022)   Social Connection and Isolation Panel [NHANES]    Frequency of Communication with Friends and Family: Not on file    Frequency of Social Gatherings with Friends and Family: Not on file    Attends Religious Services: Not on file    Active Member of Clubs or Organizations: Not on file    Attends Archivist Meetings: Not on file    Marital Status: Married  Intimate Partner Violence: Not At Risk (03/13/2022)   Humiliation, Afraid, Rape, and Kick questionnaire    Fear of Current or Ex-Partner: No    Emotionally Abused: No    Physically Abused: No    Sexually Abused: No  Allergies: Allergies  Allergen Reactions   Morphine And Related Nausea And Vomiting and Nausea Only    Hallucinations   Sulfa Antibiotics Other (See Comments)    Dizzy/Fainting   Iodine Swelling    IV iodine (states now that this can be tolerated with Benadryl)   Adhesive [Tape] Rash and Other (See Comments)    Including  Bandaids   Erythromycin Nausea And Vomiting   Oxycodone Nausea And Vomiting    Current Medications: Current Outpatient Medications  Medication Sig Dispense Refill   anastrozole (ARIMIDEX) 1 MG tablet Take 1 tablet (1 mg total) by mouth daily. 90 tablet 1   aspirin EC 81 MG tablet Take 81 mg by mouth daily.     atorvastatin (LIPITOR) 80 MG tablet TAKE 1 TABLET BY MOUTH DAILY 90 tablet 3   cholecalciferol (VITAMIN D3) 25 MCG (1000 UT) tablet Take 1,000 Units by mouth daily.     ezetimibe (ZETIA) 10 MG tablet TAKE ONE TABLET BY MOUTH EVERY DAY 90 tablet 3   ketoconazole (NIZORAL) 2 % cream Apply twice daily as directed. 60 g 2   lansoprazole (PREVACID) 30 MG capsule TAKE 1 CAPSULE BY MOUTH EVERY DAY 90 capsule 3   levothyroxine (SYNTHROID) 88 MCG tablet Take 1 tablet (88 mcg total) by mouth every morning. on an empty stomach 90 tablet 3   lisinopril (ZESTRIL) 40 MG tablet Take 1 tablet (40 mg total) by mouth daily. 90 tablet 3   mometasone (ELOCON) 0.1 % cream Apply to itchy spots on body 1-2 times until improved. Avoid face, groin, underarms. 50 g 3   Multiple Vitamin (MULTIVITAMIN) tablet Take 1 tablet by mouth daily.     naproxen (NAPROSYN) 500 MG tablet TAKE ONE TABLET BY MOUTH TWICE DAILY AS NEEDED FOR MODERATE PAIN. TAKE WITH FOOD. 30 tablet 0   Specialty Vitamins Products (ICAPS LUTEIN & ZEAXANTHIN PO) Take by mouth every morning. Zeaxanthin 45m/Lutein 145m    tacrolimus (PROTOPIC) 0.1 % ointment Apply 1-2 times daily as needed for itch to affected areas. 60 g 2   diazepam (VALIUM) 2 MG tablet Take 1 tablet 60 minutes prior to MRI scan and one tablet at time of MRI if needed for anxiety.     No current facility-administered medications for this visit.   Review of Systems General:  no complaints Skin: no complaints Eyes: no complaints HEENT: no complaints Breasts: no complaints Pulmonary: no complaints Cardiac: no complaints Gastrointestinal: no complaints Genitourinary/Sexual:  no complaints Ob/Gyn: no complaints Musculoskeletal: no complaints Hematology: no complaints Neurologic/Psych: no complaints   Objective:  Physical Examination:  BP (!) 181/85 (BP Location: Right Arm, Patient Position: Sitting)   Temp (!) 97 F (36.1 C) (Tympanic)   Ht _0  (1.6 m)   Wt 234 lb (106.1 kg)   BMI 41.45 kg/m    Body mass index is 41.45 kg/m.   GENERAL: Patient is a well appearing female in no acute distress HEENT:  Atraumatic and normocephalic. PERRL, neck supple. CV: Regular rate and rhythm Lungs: Clear to auscultation NODES:  No cervical, supraclavicular, axillary, or inguinal lymphadenopathy palpated.  ABDOMEN:  Soft, nontender. Nondistended. No masses/ascites/hernia/or hepatomegaly.  EXTREMITIES:  No peripheral edema.   NEURO:  Nonfocal. Well oriented.  Appropriate affect.  Pelvic: exam chaperoned by NP.  EGBUS: Images below. multiple vulvar scars, well healed from previous resections. Left labia has stable areas of hypopigmentation and diffuse erythema.  There is an approximately 1.5 cm area of increased erythema by the left vaginal introitus.  This is located at about 5:00. On the right- very sharp demarcation between mucosa/vulvar tissue and epithelial tissue which appears stable. Vagina: no lesions, discharge or bleeding. Cervix: surgically absent. BME: negative for masses or nodularity. RV: deferred      Assessment:  Bailey Brooks is a 71 y.o. female diagnosed with recurrent Paget's disease of the vulva status post wide local excision with positive margins and subsequent recurrence in 07/2015.  Repeat wide local excision consistent with recurrent Paget's disease of the vulva status post wide local excision with negative margins.  Biopsy in 2018 was negative.  Biopsy in 2020 at 7:00 consistent with recurrent Paget's.  She deffered prior treatment due to breast cancer diagnosis and need for therapy. She has now completed treatment and she is ready to focus  on management of Paget's disease.  01/16/20 started Imiquimod  completed 5 weeks of treatment then held 02/18/2020 due to ulceration, erythema, and pain. Restarted on 03/17/2020 and completed  08/14/2020. Negative biopsies 9/21. Opted for surveillance.  Essentially stable findings of Pagets on exam today but clinically she is asymptomatic.   History of recurrent vulvar candidiasis.  Right breast cancer- stage Ia ER positive right breast cancer - S/p lumpectomy and radiation, now on aromatase inhibitor. She is followed by medical oncology/Dr. Rogue Bussing and Dr. Baruch Gouty.   Low back pain     Plan:   Problem List Items Addressed This Visit       Musculoskeletal and Integument   Extramammary Paget disease - Primary   We again offered her surgical evaluation with EUA and ICG for mapping biopsies Vs close surveillance. Surgical options include Mohs surgery vs maintenance imiquimod vs surveillance. She prefers surveillance at this time.   Patient does request female gyn providers only.  Return to clinic in 6 months for re-evaluation.   I personally had a face to face interaction and evaluated the patient jointly with the NP student, Kerney Elbe. Mrs. Beckey Rutter scribed the note.  I have reviewed her history and available records and have performed the key portions of the physical exam including lymph node survey, HEENT, General, abdominal exam, pelvic exam with my findings confirming those documented above.  I have discussed the case with the APP student and the patient.  I agree with the above documentation, assessment and plan which was fully formulated by me. Counseling was completed by me.   I personally saw the patient and performed a substantive portion of this encounter.  Quantez Schnyder Gaetana Michaelis, MD

## 2022-10-09 ENCOUNTER — Ambulatory Visit (INDEPENDENT_AMBULATORY_CARE_PROVIDER_SITE_OTHER): Payer: Medicare Other | Admitting: Dermatology

## 2022-10-09 DIAGNOSIS — Z1283 Encounter for screening for malignant neoplasm of skin: Secondary | ICD-10-CM

## 2022-10-09 DIAGNOSIS — L304 Erythema intertrigo: Secondary | ICD-10-CM

## 2022-10-09 DIAGNOSIS — I781 Nevus, non-neoplastic: Secondary | ICD-10-CM

## 2022-10-09 DIAGNOSIS — D2262 Melanocytic nevi of left upper limb, including shoulder: Secondary | ICD-10-CM | POA: Diagnosis not present

## 2022-10-09 DIAGNOSIS — L578 Other skin changes due to chronic exposure to nonionizing radiation: Secondary | ICD-10-CM | POA: Diagnosis not present

## 2022-10-09 DIAGNOSIS — L814 Other melanin hyperpigmentation: Secondary | ICD-10-CM

## 2022-10-09 DIAGNOSIS — D692 Other nonthrombocytopenic purpura: Secondary | ICD-10-CM | POA: Diagnosis not present

## 2022-10-09 DIAGNOSIS — D2239 Melanocytic nevi of other parts of face: Secondary | ICD-10-CM

## 2022-10-09 DIAGNOSIS — L300 Nummular dermatitis: Secondary | ICD-10-CM

## 2022-10-09 DIAGNOSIS — D225 Melanocytic nevi of trunk: Secondary | ICD-10-CM | POA: Diagnosis not present

## 2022-10-09 DIAGNOSIS — D229 Melanocytic nevi, unspecified: Secondary | ICD-10-CM

## 2022-10-09 DIAGNOSIS — L853 Xerosis cutis: Secondary | ICD-10-CM

## 2022-10-09 DIAGNOSIS — L821 Other seborrheic keratosis: Secondary | ICD-10-CM | POA: Diagnosis not present

## 2022-10-09 NOTE — Progress Notes (Signed)
Follow-Up Visit   Subjective  Bailey Brooks is a 71 y.o. female who presents for the following: Annual Exam (Tbse. Hx of nevus, hx of erythema intertrigo, hx of isk, patient reports husband wants mole at back checked. ).  The patient presents for Total-Body Skin Exam (TBSE) for skin cancer screening and mole check.  The patient has spots, moles and lesions to be evaluated, some may be new or changing and the patient has concerns that these could be cancer.   The following portions of the chart were reviewed this encounter and updated as appropriate:      Review of Systems: No other skin or systemic complaints except as noted in HPI or Assessment and Plan.   Objective  Well appearing patient in no apparent distress; mood and affect are within normal limits.  A full examination was performed including scalp, head, eyes, ears, nose, lips, neck, chest, axillae, abdomen, back, buttocks, bilateral upper extremities, bilateral lower extremities, hands, feet, fingers, toes, fingernails, and toenails. All findings within normal limits unless otherwise noted below.  bilateral inguinal crease Clear at exam  left nasal tip 2.5 mm firm flesh papule   Left Upper Back 5.0 x 4.0 mm speckled brown macule   Right Lower Back 6 x 4 mm regular brown macule   left palm 5 mm flesh papule   right anterior wrist Pink scaly area right anterior wrist   right upper inner arm 6 mm blanching pink macule.    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 Currently well-controlled.    Intertrigo is a chronic recurrent rash that occurs in skin fold areas that may be associated with friction; heat; moisture; yeast; fungus; and bacteria.  It is exacerbated by increased movement / activity; sweating; and higher atmospheric temperature.   Continue Aquaphor daily as skin protectant Ketoconazole 2% cream Apply  BID prn pink bumps/patches. Pt has. Tacrolimus 0.1% ointment Apply BID prn itchy rash. Pt has.  Related Medications ketoconazole (NIZORAL) 2 % cream Apply twice daily as directed.  Fibrous papule of nose left nasal tip  Benign, observe.    Nevus (3) Left Upper Back; Right Lower Back; left palm  At left upper back Benign-appearing. Stable compared to previous visit. Observation.  Call clinic for new or changing moles.  Recommend daily use of broad spectrum spf 30+ sunscreen to sun-exposed areas.   At left palm and right lower back Benign-appearing.  Observation.  Call clinic for new or changing lesions.  Recommend daily use of broad spectrum spf 30+ sunscreen to sun-exposed areas.    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 Condition is bothersome/symptomatic for patient. Not to goal.  For itchy pink spot at right anterior wrist area  Restart  clobetasol cream apply daily to affected area for 2 weeks. If clears discontinue, if still itchy and not clear start mometasone cream until resolves.  Avoid applying to face, groin, and axilla. Use as directed. Long-term use can cause thinning of the skin.   Topical steroids (such as triamcinolone, fluocinolone, fluocinonide, mometasone, clobetasol, halobetasol, betamethasone, hydrocortisone) can cause thinning and lightening of the skin if they are used for too long in the same area. Your physician has selected the right strength medicine for your problem and area affected on the body. Please use your medication only as directed by your physician to prevent side effects.  Recommend mild soap and moisturizing cream 1-2 times daily.  Gentle skin care handout provided.      Telangiectasia right upper inner arm  Benign, Stable. Observe.    Lentigines - Scattered tan macules - Due to sun exposure - Benign-appearing, observe - Recommend daily broad  spectrum sunscreen SPF 30+ to sun-exposed areas, reapply every 2 hours as needed. - Call for any changes  Seborrheic Keratoses At left mid back - Stuck-on, waxy, tan-brown papules and/or plaques  - Benign-appearing - Discussed benign etiology and prognosis. - Observe - Call for any changes  Purpura - Chronic; persistent and recurrent.  Treatable, but not curable. - Violaceous macules and patches - Benign - Related to trauma, age, sun damage and/or use of blood thinners, chronic use of topical and/or oral steroids - Observe - Can use OTC arnica containing moisturizer such as Dermend Bruise Formula if desired - Call for worsening or other concerns  Xerosis with pruritus  - diffuse xerotic patches - recommend gentle, hydrating skin care - gentle skin care handout given  Dermatofibroma vs sk  Right medial lower thigh 6 mm pink bright firm papule with darker edge  - Firm pink/brown papulenodule with dimple sign - Benign appearing - Call for any changes   Melanocytic Nevi - Tan-brown and/or pink-flesh-colored symmetric macules and papules - Benign appearing on exam today - Observation - Call clinic for new or changing moles - Recommend daily use of broad spectrum spf 30+ sunscreen to sun-exposed areas.   Hemangiomas - Red papules - Discussed benign nature - Observe - Call for any changes  Actinic Damage - Chronic condition, secondary to cumulative UV/sun exposure - diffuse scaly erythematous macules with underlying dyspigmentation - Recommend daily broad spectrum sunscreen SPF 30+ to sun-exposed areas, reapply every 2 hours as needed.  - Staying in the shade or wearing long sleeves, sun glasses (UVA+UVB protection) and wide brim hats (4-inch brim around the entire circumference of the hat) are also recommended for sun protection.  - Call for new or changing lesions.  Skin cancer screening performed today. Return in about 1 year (around 10/10/2023) for TBSE. I, Ruthell Rummage, CMA, am acting as scribe for Brendolyn Patty, MD.  Documentation: I have reviewed the above documentation for accuracy and completeness, and I agree with the above.  Brendolyn Patty MD

## 2022-10-09 NOTE — Patient Instructions (Addendum)
For itchy pink spot at right anterior wrist area  Restart  clobetasol cream apply daily to affected area for 2 weeks. If clears discontinue if still itchy and not clear start mometasone cream .  Avoid applying to face, groin, and axilla. Use as directed. Long-term use can cause thinning of the skin.   Topical steroids (such as triamcinolone, fluocinolone, fluocinonide, mometasone, clobetasol, halobetasol, betamethasone, hydrocortisone) can cause thinning and lightening of the skin if they are used for too long in the same area. Your physician has selected the right strength medicine for your problem and area affected on the body. Please use your medication only as directed by your physician to prevent side effects.     Seborrheic Keratosis  What causes seborrheic keratoses? Seborrheic keratoses are harmless, common skin growths that first appear during adult life.  As time goes by, more growths appear.  Some people may develop a large number of them.  Seborrheic keratoses appear on both covered and uncovered body parts.  They are not caused by sunlight.  The tendency to develop seborrheic keratoses can be inherited.  They vary in color from skin-colored to gray, brown, or even black.  They can be either smooth or have a rough, warty surface.   Seborrheic keratoses are superficial and look as if they were stuck on the skin.  Under the microscope this type of keratosis looks like layers upon layers of skin.  That is why at times the top layer may seem to fall off, but the rest of the growth remains and re-grows.    Treatment Seborrheic keratoses do not need to be treated, but can easily be removed in the office.  Seborrheic keratoses often cause symptoms when they rub on clothing or jewelry.  Lesions can be in the way of shaving.  If they become inflamed, they can cause itching, soreness, or burning.  Removal of a seborrheic keratosis can be accomplished by freezing, burning, or surgery. If any spot  bleeds, scabs, or grows rapidly, please return to have it checked, as these can be an indication of a skin cancer.   Gentle Skin Care Guide  1. Bathe no more than once a day.  2. Avoid bathing in hot water  3. Use a mild soap like Dove, Vanicream, Cetaphil, CeraVe. Can use Lever 2000 or Cetaphil antibacterial soap  4. Use soap only where you need it. On most days, use it under your arms, between your legs, and on your feet. Let the water rinse other areas unless visibly dirty.  5. When you get out of the bath/shower, use a towel to gently blot your skin dry, don't rub it.  6. While your skin is still a little damp, apply a moisturizing cream such as Vanicream, CeraVe, Cetaphil, Eucerin, Sarna lotion or plain Vaseline Jelly. For hands apply Neutrogena [place] Hand Cream or Excipial Hand Cream.  7. Reapply moisturizer any time you start to itch or feel dry.  8. Sometimes using free and clear laundry detergents can be helpful. Fabric softener sheets should be avoided. Downy Free & Gentle liquid, or any liquid fabric softener that is free of dyes and perfumes, it acceptable to use  9. If your doctor has given you prescription creams you may apply moisturizers over them         Melanoma ABCDEs  Melanoma is the most dangerous type of skin cancer, and is the leading cause of death from skin disease.  You are more likely to develop melanoma if you:  Have light-colored skin, light-colored eyes, or red or blond hair Spend a lot of time in the sun Tan regularly, either outdoors or in a tanning bed Have had blistering sunburns, especially during childhood Have a close family member who has had a melanoma Have atypical moles or large birthmarks  Early detection of melanoma is key since treatment is typically straightforward and cure rates are extremely high if we catch it early.   The first sign of melanoma is often a change in a mole or a new dark spot.  The ABCDE system is a way of  remembering the signs of melanoma.  A for asymmetry:  The two halves do not match. B for border:  The edges of the growth are irregular. C for color:  A mixture of colors are present instead of an even brown color. D for diameter:  Melanomas are usually (but not always) greater than 17m - the size of a pencil eraser. E for evolution:  The spot keeps changing in size, shape, and color.  Please check your skin once per month between visits. You can use a small mirror in front and a large mirror behind you to keep an eye on the back side or your body.   If you see any new or changing lesions before your next follow-up, please call to schedule a visit.  Please continue daily skin protection including broad spectrum sunscreen SPF 30+ to sun-exposed areas, reapplying every 2 hours as needed when you're outdoors.   Staying in the shade or wearing long sleeves, sun glasses (UVA+UVB protection) and wide brim hats (4-inch brim around the entire circumference of the hat) are also recommended for sun protection.    Due to recent changes in healthcare laws, you may see results of your pathology and/or laboratory studies on MyChart before the doctors have had a chance to review them. We understand that in some cases there may be results that are confusing or concerning to you. Please understand that not all results are received at the same time and often the doctors may need to interpret multiple results in order to provide you with the best plan of care or course of treatment. Therefore, we ask that you please give u[place]2 business days to thoroughly review all your results before contacting the office for clarification. Should we see a critical lab result, you will be contacted sooner.   If You Need Anything After Your Visit  If you have any questions or concerns for your doctor, please call our main line at [phone] press option 4 to reach your doctor's medical assistant. If no one answers, please  leave a voicemail as directed and we will return your call as soon as possible. Messages left after 4 pm will be answered the following business day.   You may also send [place] message via [facility] We typically respond to MyChart messages within 1-2 business days.  For prescription refills, please ask your pharmacy to contact our office. Our fax number is [phone]  If you have an urgent issue when the clinic is closed that cannot wait until the next business day, you can page your doctor at the number below.    Please note that while we do our best to be available for urgent issues outside of office hours, we are not available 24/7.   If you have an urgent issue and are unable to reach u[place] you may choose to seek medical care at your doctor's office, retail clinic,  urgent care center, or emergency room.  If you have a medical emergency, please immediately call 911 or go to the emergency department.  Pager Numbers  - Dr. Nehemiah Massed: [phone]  - Dr. Laurence Ferrari: [phone]  - Dr. Nicole Kindred: [phone]  In the event of inclement weather, please call our main line at [phone] for an update on the status of any delays or closures.  Dermatology Medication Tips: Please keep the boxes that topical medications come in in order to help keep track of the instructions about where and how to use these. Pharmacies typically print the medication instructions only on the boxes and not directly on the medication tubes.   If your medication is too expensive, please contact our office at [phone] option 4 or send [place] a message through [place].   We are unable to tell what your co-pay for medications will be in advance as this is different depending on your insurance coverage. However, we may be able to find a substitute medication at lower cost or fill out paperwork to get insurance to cover a needed medication.   If a prior authorization is required to get your medication covered by your insurance  company, please allow [place] 1-2 business days to complete this process.  Drug prices often vary depending on where the prescription is filled and some pharmacies may offer cheaper prices.  The website www.goodrx.com contains coupons for medications through different pharmacies. The prices here do not account for what the cost may be with help from insurance (it may be cheaper with your insurance), but the website can give you the price if you did not use any insurance.  - You can print the associated coupon and take it with your prescription to the pharmacy.  - You may also stop by our office during regular business hours and pick up a GoodRx coupon card.  - If you need your prescription sent electronically to a different pharmacy, notify our office through [facility] or by phone at [phone] option 4.     Si Usted Necesita Algo Despus de Su Visita  Tambin puede enviarnos un mensaje a travs de Pharmacist, community. Por lo general respondemos a los mensajes de MyChart en el transcurso de 1 a 2 das hbiles.  Para renovar recetas, por favor pida a su farmacia que se ponga en contacto con nuestra oficina. Harland Dingwall de fax es [place] [phone].  Si tiene un asunto urgente cuando la clnica est cerrada y que no puede esperar hasta el siguiente da hbil, puede llamar/localizar a su doctor(a) al nmero que aparece a continuacin.   Por favor, tenga en cuenta que aunque hacemos todo lo posible para estar disponibles para asuntos urgentes fuera del horario de [place], no estamos disponibles las 24 horas del da, los 7 das de la [place].   Si tiene un problema urgente y no puede comunicarse con nosotros, puede optar por buscar atencin mdica  en el consultorio de su doctor(a), en una clnica privada, en un centro de atencin urgente o en una sala de emergencias.  Si tiene Engineering geologist, por favor llame inmediatamente al 911 o vaya a la sala de emergencias.  Nmeros de bper  - Dr.  Nehemiah Massed: [phone]  - Dra. Moye: [phone]  - Dra. Nicole Kindred: [phone]  En caso de inclemencias del [place], por favor llame a Johnsie Kindred principal al [phone] para una actualizacin sobre el [place] de cualquier retraso o cierre.  Consejos para la medicacin en dermatologa: Por favor, guarde las cajas  en las que vienen los medicamentos de uso tpico para ayudarle a seguir las instrucciones sobre dnde y cmo usarlos. Las farmacias generalmente imprimen las instrucciones del medicamento slo en las cajas y no directamente en los tubos del [place].   Si su medicamento es muy caro, por favor, pngase en contacto con Zigmund Daniel llamando al [phone] y presione la opcin 4 o envenos un mensaje a travs de Pharmacist, community.   No podemos decirle cul ser su copago por los medicamentos por adelantado ya que esto es diferente dependiendo de la cobertura de su seguro. Sin embargo, es posible que podamos encontrar un medicamento sustituto a Electrical engineer un formulario para que el seguro cubra el medicamento que se considera necesario.   Si se requiere una autorizacin previa para que su compaa de seguros [place] su medicamento, por favor permtanos de 1 a 2 das hbiles para completar este proceso.  Los precios de los medicamentos varan con frecuencia dependiendo del Environmental consultant de dnde se surte la receta y alguna farmacias pueden ofrecer precios ms baratos.  El sitio web www.goodrx.com tiene cupones para medicamentos de Airline pilot. Los precios aqu no tienen en cuenta lo que podra costar con la ayuda del seguro (puede ser ms barato con su seguro), pero el sitio web puede darle el precio si no utiliz Research scientist (physical sciences).  - Puede imprimir el cupn correspondiente y llevarlo con su receta a la farmacia.  - Tambin puede pasar por nuestra oficina durante el horario de atencin regular y Charity fundraiser una tarjeta de cupones de GoodRx.  - Si necesita que su receta se enve  electrnicamente a una farmacia diferente, informe a nuestra oficina a travs de MyChart de [place] o por telfono llamando al [phone] y presione la opcin 4.

## 2022-10-10 ENCOUNTER — Ambulatory Visit: Payer: Medicare Other | Admitting: Family Medicine

## 2022-10-24 ENCOUNTER — Other Ambulatory Visit: Payer: Self-pay | Admitting: Family Medicine

## 2022-10-25 ENCOUNTER — Ambulatory Visit (INDEPENDENT_AMBULATORY_CARE_PROVIDER_SITE_OTHER): Payer: Medicare Other | Admitting: Family Medicine

## 2022-10-25 ENCOUNTER — Encounter: Payer: Self-pay | Admitting: Family Medicine

## 2022-10-25 VITALS — BP 122/82 | HR 76 | Temp 98.3°F | Ht 63.0 in | Wt 241.2 lb

## 2022-10-25 DIAGNOSIS — C50411 Malignant neoplasm of upper-outer quadrant of right female breast: Secondary | ICD-10-CM

## 2022-10-25 DIAGNOSIS — R7303 Prediabetes: Secondary | ICD-10-CM

## 2022-10-25 DIAGNOSIS — I10 Essential (primary) hypertension: Secondary | ICD-10-CM | POA: Diagnosis not present

## 2022-10-25 DIAGNOSIS — Q159 Congenital malformation of eye, unspecified: Secondary | ICD-10-CM

## 2022-10-25 DIAGNOSIS — Z170 Estrogen receptor positive status [ER+]: Secondary | ICD-10-CM

## 2022-10-28 ENCOUNTER — Encounter: Payer: Self-pay | Admitting: Family Medicine

## 2022-10-30 ENCOUNTER — Other Ambulatory Visit: Payer: Self-pay | Admitting: Family Medicine

## 2022-10-30 DIAGNOSIS — Q159 Congenital malformation of eye, unspecified: Secondary | ICD-10-CM | POA: Insufficient documentation

## 2022-10-30 MED ORDER — NAPROXEN 500 MG PO TABS: ORAL_TABLET | ORAL | 0 refills | Status: DC

## 2022-10-30 NOTE — Assessment & Plan Note (Signed)
Generally well-controlled.  She will return for nurse BP check and lab work.  She will continue lisinopril 40 mg daily.

## 2022-10-30 NOTE — Progress Notes (Signed)
Bailey Rumps, MD Phone: [phone]  TAYNA SMETHURST is a 71 y.o. female who presents today for follow-up.  Prediabetes: Last A1c was 5.5.  She is been drinking more water.  She is trying not to eat any sweets.  Her exercise is limited by her back.  Hypertension: 135/73 average.  She is on lisinopril.  No chest pain, shortness of breath, or persistent edema.  She was on a trip and had some lower extremity swelling that resolved when she got home.  Eye issues: Patient notes she is in the process of switching to a new ophthalmologist.  She reports possibly a history of macular degeneration.  Social History   Tobacco Use  Smoking Status Never  Smokeless Tobacco Never    Current Outpatient Medications on File Prior to Visit  Medication Sig Dispense Refill   anastrozole (ARIMIDEX) 1 MG tablet Take 1 tablet (1 mg total) by mouth daily. 90 tablet 1   aspirin EC 81 MG tablet Take 81 mg by mouth daily.     atorvastatin (LIPITOR) 80 MG tablet TAKE 1 TABLET BY MOUTH DAILY 90 tablet 3   cholecalciferol (VITAMIN D3) 25 MCG (1000 UT) tablet Take 1,000 Units by mouth daily.     ezetimibe (ZETIA) 10 MG tablet TAKE ONE TABLET BY MOUTH EVERY DAY 90 tablet 3   ketoconazole (NIZORAL) 2 % cream Apply twice daily as directed. 60 g 2   lansoprazole (PREVACID) 30 MG capsule TAKE 1 CAPSULE BY MOUTH EVERY DAY 90 capsule 3   levothyroxine (SYNTHROID) 88 MCG tablet Take 1 tablet (88 mcg total) by mouth every morning. on an empty stomach 90 tablet 3   lisinopril (ZESTRIL) 40 MG tablet Take 1 tablet (40 mg total) by mouth daily. 90 tablet 3   mometasone (ELOCON) 0.1 % cream Apply to itchy spots on body 1-2 times until improved. Avoid face, groin, underarms. 50 g 3   Multiple Vitamin (MULTIVITAMIN) tablet Take 1 tablet by mouth daily.     naproxen (NAPROSYN) 500 MG tablet TAKE ONE TABLET BY MOUTH TWICE DAILY AS NEEDED FOR MODERATE PAIN. TAKE WITH FOOD. 30 tablet 0   Specialty Vitamins Products (ICAPS LUTEIN  & ZEAXANTHIN PO) Take by mouth every morning. Zeaxanthin 86m/Lutein 118m    tacrolimus (PROTOPIC) 0.1 % ointment Apply 1-2 times daily as needed for itch to affected areas. 60 g 2   No current facility-administered medications on file prior to visit.     ROS see history of present illness  Objective  Physical Exam Vitals:   10/25/22 1626  BP: 122/82  Pulse: 76  Temp: 98.3 F (36.8 C)  SpO2: 98%    BP Readings from Last 3 Encounters:  10/25/22 122/82  10/04/22 (!) 181/85  07/24/22 (!) 169/87   Wt Readings from Last 3 Encounters:  10/25/22 241 lb 3.2 oz (109.4 kg)  10/04/22 234 lb (106.1 kg)  07/24/22 240 lb 6.4 oz (109 kg)    Physical Exam Constitutional:      General: She is not in acute distress.    Appearance: She is not diaphoretic.  Cardiovascular:     Rate and Rhythm: Normal rate and regular rhythm.     Heart sounds: Normal heart sounds.  Pulmonary:     Effort: Pulmonary effort is normal.     Breath sounds: Normal breath sounds.  Skin:    General: Skin is warm and dry.  Neurological:     Mental Status: She is alert.      Assessment/Plan:  Please see individual problem list.  Problem List Items Addressed This Visit     Essential hypertension (Chronic)    Generally well-controlled.  She will return for nurse BP check and lab work.  She will continue lisinopril 40 mg daily.      Prediabetes - Primary (Chronic)    Encouraged healthy diet.  She will remain as active as she is able to.      Relevant Orders   Comp Met (CMET)   HgB A1c   Carcinoma of upper-outer quadrant of right breast in female, estrogen receptor positive ([place])    Diagnostic mammogram ordered.      Relevant Orders   MM DIAG BREAST TOMO BILATERAL   Eye abnormality    She will see the new ophthalmologist to determine if she does have macular degeneration.        Return in about 2 weeks (around 11/08/2022) for nurse BP check and labs, 3 months PCP.   Bailey Rumps,  MD [place]

## 2022-10-30 NOTE — Assessment & Plan Note (Signed)
Diagnostic mammogram ordered.  

## 2022-10-30 NOTE — Assessment & Plan Note (Signed)
Encouraged healthy diet.  She will remain as active as she is able to.

## 2022-10-30 NOTE — Assessment & Plan Note (Signed)
She will see the new ophthalmologist to determine if she does have macular degeneration.

## 2022-10-31 ENCOUNTER — Encounter: Payer: Self-pay | Admitting: Internal Medicine

## 2022-11-02 ENCOUNTER — Other Ambulatory Visit: Payer: Self-pay | Admitting: Surgery

## 2022-11-06 ENCOUNTER — Encounter
Admission: RE | Admit: 2022-11-06 | Discharge: 2022-11-06 | Disposition: A | Discharge status: Home / Self Care | Payer: Medicare Other | Source: Ambulatory Visit | Attending: Surgery | Admitting: Surgery

## 2022-11-06 ENCOUNTER — Encounter: Payer: Self-pay | Admitting: Surgery

## 2022-11-06 ENCOUNTER — Encounter: Payer: Self-pay | Admitting: Family Medicine

## 2022-11-06 VITALS — BP 174/88 | HR 62 | Resp 16 | Ht 63.0 in | Wt 239.2 lb

## 2022-11-06 DIAGNOSIS — R9431 Abnormal electrocardiogram [ECG] [EKG]: Secondary | ICD-10-CM | POA: Insufficient documentation

## 2022-11-06 DIAGNOSIS — Z01818 Encounter for other preprocedural examination: Secondary | ICD-10-CM

## 2022-11-06 DIAGNOSIS — Z01810 Encounter for preprocedural cardiovascular examination: Secondary | ICD-10-CM | POA: Diagnosis not present

## 2022-11-06 DIAGNOSIS — R8290 Unspecified abnormal findings in urine: Secondary | ICD-10-CM | POA: Diagnosis not present

## 2022-11-06 HISTORY — DX: Prediabetes: R73.03

## 2022-11-06 HISTORY — DX: Dermatitis, unspecified: L30.9

## 2022-11-06 HISTORY — DX: Other complications of anesthesia, initial encounter: T88.59XA

## 2022-11-06 LAB — COMPREHENSIVE METABOLIC PANEL
ALT: 22 U/L (ref 0–44)
AST: 27 U/L (ref 15–41)
Albumin: 3.8 g/dL (ref 3.5–5.0)
Alkaline Phosphatase: 94 U/L (ref 38–126)
Anion gap: 6 (ref 5–15)
BUN: 20 mg/dL (ref 8–23)
CO2: 31 mmol/L (ref 22–32)
Calcium: 9.5 mg/dL (ref 8.9–10.3)
Chloride: 104 mmol/L (ref 98–111)
Creatinine, Ser: 0.88 mg/dL (ref 0.44–1.00)
GFR, Estimated: >60 mL/min (ref >60)
Glucose, Bld: 105 mg/dL — ABNORMAL HIGH (ref 70–99)
Potassium: 3.5 mmol/L (ref 3.5–5.1)
Sodium: 141 mmol/L (ref 135–145)
Total Bilirubin: 0.8 mg/dL (ref 0.3–1.2)
Total Protein: 7.3 g/dL (ref 6.5–8.1)

## 2022-11-06 LAB — SURGICAL PCR SCREEN
MRSA, PCR: NEGATIVE
Staphylococcus aureus: NEGATIVE

## 2022-11-06 LAB — URINALYSIS, ROUTINE W REFLEX MICROSCOPIC
Bacteria, UA: NONE SEEN
Bilirubin Urine: NEGATIVE
Glucose, UA: NEGATIVE mg/dL
Hgb urine dipstick: NEGATIVE
Ketones, ur: NEGATIVE mg/dL
Nitrite: NEGATIVE
Protein, ur: NEGATIVE mg/dL
Specific Gravity, Urine: 1.028 (ref 1.005–1.030)
pH: 5 (ref 5.0–8.0)

## 2022-11-06 LAB — CBC WITH DIFFERENTIAL/PLATELET
Abs Immature Granulocytes: 0.03 10*3/uL (ref 0.00–0.07)
Basophils Absolute: 0 10*3/uL (ref 0.0–0.1)
Basophils Relative: 0 %
Eosinophils Absolute: 0.1 10*3/uL (ref 0.0–0.5)
Eosinophils Relative: 1 %
HCT: 41.4 % (ref 36.0–46.0)
Hemoglobin: 13.3 g/dL (ref 12.0–15.0)
Immature Granulocytes: 1 %
Lymphocytes Relative: 35 %
Lymphs Abs: 2.2 10*3/uL (ref 0.7–4.0)
MCH: 28.6 pg (ref 26.0–34.0)
MCHC: 32.1 g/dL (ref 30.0–36.0)
MCV: 89 fL (ref 80.0–100.0)
Monocytes Absolute: 0.5 10*3/uL (ref 0.1–1.0)
Monocytes Relative: 8 %
Neutro Abs: 3.5 10*3/uL (ref 1.7–7.7)
Neutrophils Relative %: 55 %
Platelets: 236 10*3/uL (ref 150–400)
RBC: 4.65 MIL/uL (ref 3.87–5.11)
RDW: 14.8 % (ref 11.5–15.5)
WBC: 6.3 10*3/uL (ref 4.0–10.5)
nRBC: 0 % (ref 0.0–0.2)

## 2022-11-06 NOTE — Patient Instructions (Addendum)
Your procedure is scheduled on:11-14-22 Tuesday Report to the Registration Desk on the 1st floor of the [facility].Then proceed to the 2nd floor Surgery Desk To find out your arrival time, please call [phone] between 1PM - 3PM on:11-13-22 Monday If your arrival time is 6:00 am, do not arrive prior to that time as the [facility] entrance doors do not open until 6:00 am.  REMEMBER: Instructions that are not followed completely may result in serious medical risk, up to and including death; or upon the discretion of your surgeon and anesthesiologist your surgery may need to be rescheduled.  Do not eat food after midnight the night before surgery.  No gum chewing, lozengers or hard candies.  You may however, drink CLEAR liquids up to 2 hours before you are scheduled to arrive for your surgery. Do not drink anything within 2 hours of your scheduled arrival time.  Clear liquids include: - water  - apple juice without pulp - gatorade (not RED colors) - black coffee or tea (Do NOT add milk or creamers to the coffee or tea) Do NOT drink anything that is not on this list  In addition, your doctor has ordered for you to drink the provided Gatorade G2 Drinking this carbohydrate drink up to two hours before surgery helps to reduce insulin resistance and improve patient outcomes. Please complete drinking 2 hours prior to scheduled arrival time.  TAKE THESE MEDICATIONS THE MORNING OF SURGERY WITH A SIP OF WATER: -levothyroxine (SYNTHROID)  -ezetimibe (ZETIA)  -atorvastatin (LIPITOR) -lansoprazole (PREVACID) -take one the night before surgery and one the morning of surgery  Ask Dr Knox Saliva office tomorrow (11-07-22) to find out when you need to stop your 81 mg Aspirin  One week prior to surgery: Stop Anti-inflammatories (NSAIDS) such as Advil, Aleve, Ibuprofen, Motrin, Naproxen, Naprosyn and Aspirin based products such as Excedrin, Goodys Powder, BC Powder.You may however, continue to take  Tylenol if needed for pain up until the day of surgery.  Stop ANY OVER THE COUNTER supplements/vitamins NOW (11-06-22) until after surgery (Vitamin D3, multivitamin, Lutein & Zeaxanthin)  No Alcohol for 24 hours before or after surgery.  No Smoking including e-cigarettes for 24 hours prior to surgery.  No chewable tobacco products for at least 6 hours prior to surgery.  No nicotine patches on the day of surgery.  Do not use any "recreational" drugs for at least a week prior to your surgery.  Please be advised that the combination of cocaine and anesthesia may have negative outcomes, up to and including death. If you test positive for cocaine, your surgery will be cancelled.  On the morning of surgery brush your teeth with toothpaste and water, you may rinse your mouth with mouthwash if you wish. Do not swallow any toothpaste or mouthwash.  Use CHG Soap as directed on instruction sheet.  Do not wear jewelry, make-up, hairpins, clips or nail polish.  Do not wear lotions, powders, or perfumes.   Do not shave body from the neck down 48 hours prior to surgery just in case you cut yourself which could leave a site for infection.  Also, freshly shaved skin may become irritated if using the CHG soap.  Contact lenses, hearing aids and dentures may not be worn into surgery.  Do not bring valuables to the hospital. [facility] is not responsible for any missing/lost belongings or valuables.   Notify your doctor if there is any change in your medical condition (cold, fever, infection).  Wear comfortable clothing (  specific to your surgery type) to the hospital.  After surgery, you can help prevent lung complications by doing breathing exercises.  Take deep breaths and cough every 1-2 hours. Your doctor may order a device called an Incentive Spirometer to help you take deep breaths. When coughing or sneezing, hold a pillow firmly against your incision with both hands. This is called "splinting."  Doing this helps protect your incision. It also decreases belly discomfort.  If you are being admitted to the hospital overnight, leave your suitcase in the car. After surgery it may be brought to your room.  If you are being discharged the day of surgery, you will not be allowed to drive home. You will need a responsible adult (18 years or older) to drive you home and stay with you that night.   If you are taking public transportation, you will need to have a responsible adult (18 years or older) with you. Please confirm with your physician that it is acceptable to use public transportation.   Please call the [facility]. at [phone] if you have any questions about these instructions.  Surgery Visitation Policy:  Patients undergoing a surgery or procedure may have two family members or support persons with them as long as the person is not COVID-19 positive or experiencing its symptoms.   Inpatient Visitation:    Visiting hours are 7 a.m. to 8 p.m. Up to four visitors are allowed at one time in a patient room. The visitors may rotate out with other people during the day. One designated support person (adult) may remain overnight.  MASKING: Due to an increase in RSV rates and hospitalizations, in-patient care areas in which we serve newborns, infants and children, masks will be required for teammates and visitors.  Children ages 82 and under may not visit. This policy affects the following departments only:  [place] Postpartum area Mother Baby Unit Newborn nursery/Special care nursery  Other areas: Masks continue to be strongly recommended for [facility] teammates, visitors and patients in all other areas. Visitation is not restricted outside of the units listed above.   How to Use an Incentive Spirometer An incentive spirometer is a tool that measures how well you are filling your lungs with each breath. Learning to take long,  deep breaths using this tool can help you keep your lungs clear and active. This may help to reverse or lessen your chance of developing breathing (pulmonary) problems, especially infection. You may be asked to use a spirometer: After a surgery. If you have a lung problem or a history of smoking. After a long period of time when you have been unable to move or be active. If the spirometer includes an indicator to show the highest number that you have reached, your health care provider or respiratory therapist will help you set a goal. Keep a log of your progress as told by your health care provider. What are the risks? Breathing too quickly may cause dizziness or cause you to pass out. Take your time so you do not get dizzy or light-headed. If you are in pain, you may need to take pain medicine before doing incentive spirometry. It is harder to take a deep breath if you are having pain. How to use your incentive spirometer  Sit up on the edge of your bed or on a chair. Hold the incentive spirometer so that it is in an upright position. Before you use the spirometer, breathe out normally. Place  the mouthpiece in your mouth. Make sure your lips are closed tightly around it. Breathe in slowly and as deeply as you can through your mouth, causing the piston or the ball to rise toward the top of the chamber. Hold your breath for 3-5 seconds, or for as long as possible. If the spirometer includes a coach indicator, use this to guide you in breathing. Slow down your breathing if the indicator goes above the marked areas. Remove the mouthpiece from your mouth and breathe out normally. The piston or ball will return to the bottom of the chamber. Rest for a few seconds, then repeat the steps 10 or more times. Take your time and take a few normal breaths between deep breaths so that you do not get dizzy or light-headed. Do this every 1-2 hours when you are awake. If the spirometer includes a goal marker to  show the highest number you have reached (best effort), use this as a goal to work toward during each repetition. After each set of 10 deep breaths, cough a few times. This will help to make sure that your lungs are clear. If you have an incision on your chest or abdomen from surgery, place a pillow or a rolled-up towel firmly against the incision when you cough. This can help to reduce pain while taking deep breaths and coughing. General tips When you are able to get out of bed: Walk around often. Continue to take deep breaths and cough in order to clear your lungs. Keep using the incentive spirometer until your health care provider says it is okay to stop using it. If you have been in the hospital, you may be told to keep using the spirometer at home. Contact a health care provider if: You are having difficulty using the spirometer. You have trouble using the spirometer as often as instructed. Your pain medicine is not giving enough relief for you to use the spirometer as told. You have a fever. Get help right away if: You develop shortness of breath. You develop a cough with bloody mucus from the lungs. You have fluid or blood coming from an incision site after you cough. Summary An incentive spirometer is a tool that can help you learn to take long, deep breaths to keep your lungs clear and active. You may be asked to use a spirometer after a surgery, if you have a lung problem or a history of smoking, or if you have been inactive for a long period of time. Use your incentive spirometer as instructed every 1-2 hours while you are awake. If you have an incision on your chest or abdomen, place a pillow or a rolled-up towel firmly against your incision when you cough. This will help to reduce pain. Get help right away if you have shortness of breath, you cough up bloody mucus, or blood comes from your incision when you cough. This information is not intended to replace advice given to you by  your health care provider. Make sure you discuss any questions you have with your health care provider. Document Revised: 02/09/2020 Document Reviewed: 02/09/2020 Elsevier Patient Education  [place].

## 2022-11-07 DIAGNOSIS — M19012 Primary osteoarthritis, left shoulder: Secondary | ICD-10-CM | POA: Diagnosis not present

## 2022-11-07 LAB — TYPE AND SCREEN
ABO/RH(D): O POS
Antibody Screen: NEGATIVE

## 2022-11-08 ENCOUNTER — Encounter: Payer: Self-pay | Admitting: Surgery

## 2022-11-09 ENCOUNTER — Ambulatory Visit (INDEPENDENT_AMBULATORY_CARE_PROVIDER_SITE_OTHER): Payer: Medicare Other

## 2022-11-09 DIAGNOSIS — R7303 Prediabetes: Secondary | ICD-10-CM | POA: Diagnosis not present

## 2022-11-09 LAB — URINE CULTURE: Culture: <10000 — AB

## 2022-11-09 LAB — POCT GLYCOSYLATED HEMOGLOBIN (HGB A1C): Hemoglobin A1C: 5.7 % — AB (ref 4.0–5.6)

## 2022-11-09 NOTE — Progress Notes (Signed)
noted 

## 2022-11-09 NOTE — Progress Notes (Signed)
Pt came in for BP check and AIC check..blood pressure was 136/82 and  POC A1C was 5.7

## 2022-11-09 NOTE — Progress Notes (Deleted)
Patient presented for BP recheck and A1C patient voiced no concerns nor showed any signs of distress

## 2022-11-14 ENCOUNTER — Encounter: Payer: Self-pay | Admitting: Surgery

## 2022-11-14 ENCOUNTER — Observation Stay
Admission: RE | Admit: 2022-11-14 | Discharge: 2022-11-15 | Disposition: A | Discharge status: Home Health | Payer: Medicare Other | Attending: Surgery | Admitting: Surgery

## 2022-11-14 ENCOUNTER — Observation Stay: Payer: Medicare Other

## 2022-11-14 ENCOUNTER — Ambulatory Visit: Payer: Medicare Other | Admitting: Urgent Care

## 2022-11-14 ENCOUNTER — Other Ambulatory Visit: Payer: Self-pay

## 2022-11-14 ENCOUNTER — Encounter
Admission: RE | Disposition: A | Discharge status: Home Health | Payer: Self-pay | Source: Home / Self Care | Attending: Surgery

## 2022-11-14 ENCOUNTER — Ambulatory Visit: Payer: Medicare Other

## 2022-11-14 DIAGNOSIS — I10 Essential (primary) hypertension: Secondary | ICD-10-CM | POA: Insufficient documentation

## 2022-11-14 DIAGNOSIS — Z96612 Presence of left artificial shoulder joint: Secondary | ICD-10-CM | POA: Diagnosis not present

## 2022-11-14 DIAGNOSIS — Z853 Personal history of malignant neoplasm of breast: Secondary | ICD-10-CM | POA: Insufficient documentation

## 2022-11-14 DIAGNOSIS — M7582 Other shoulder lesions, left shoulder: Secondary | ICD-10-CM | POA: Insufficient documentation

## 2022-11-14 DIAGNOSIS — M75122 Complete rotator cuff tear or rupture of left shoulder, not specified as traumatic: Principal | ICD-10-CM | POA: Insufficient documentation

## 2022-11-14 DIAGNOSIS — Z79899 Other long term (current) drug therapy: Secondary | ICD-10-CM | POA: Insufficient documentation

## 2022-11-14 DIAGNOSIS — Z7902 Long term (current) use of antithrombotics/antiplatelets: Secondary | ICD-10-CM | POA: Insufficient documentation

## 2022-11-14 DIAGNOSIS — G8918 Other acute postprocedural pain: Secondary | ICD-10-CM | POA: Diagnosis not present

## 2022-11-14 DIAGNOSIS — E039 Hypothyroidism, unspecified: Secondary | ICD-10-CM | POA: Diagnosis not present

## 2022-11-14 DIAGNOSIS — M7522 Bicipital tendinitis, left shoulder: Secondary | ICD-10-CM | POA: Diagnosis not present

## 2022-11-14 DIAGNOSIS — Z8673 Personal history of transient ischemic attack (TIA), and cerebral infarction without residual deficits: Secondary | ICD-10-CM | POA: Diagnosis not present

## 2022-11-14 DIAGNOSIS — Z471 Aftercare following joint replacement surgery: Secondary | ICD-10-CM | POA: Diagnosis not present

## 2022-11-14 DIAGNOSIS — Z7982 Long term (current) use of aspirin: Secondary | ICD-10-CM | POA: Diagnosis not present

## 2022-11-14 DIAGNOSIS — Z01812 Encounter for preprocedural laboratory examination: Secondary | ICD-10-CM

## 2022-11-14 DIAGNOSIS — R8290 Unspecified abnormal findings in urine: Secondary | ICD-10-CM

## 2022-11-14 DIAGNOSIS — M19012 Primary osteoarthritis, left shoulder: Secondary | ICD-10-CM | POA: Insufficient documentation

## 2022-11-14 DIAGNOSIS — M12812 Other specific arthropathies, not elsewhere classified, left shoulder: Secondary | ICD-10-CM | POA: Diagnosis not present

## 2022-11-14 HISTORY — DX: Anemia due to antineoplastic chemotherapy: D64.81

## 2022-11-14 HISTORY — DX: Long term (current) use of aromatase inhibitors: Z79.811

## 2022-11-14 HISTORY — DX: Benign neoplasm of colon, unspecified: D12.6

## 2022-11-14 HISTORY — PX: REVERSE SHOULDER ARTHROPLASTY: SHX5054

## 2022-11-14 HISTORY — DX: Adverse effect of antineoplastic and immunosuppressive drugs, initial encounter: T45.1X5A

## 2022-11-14 SURGERY — ARTHROPLASTY, SHOULDER, TOTAL, REVERSE
Anesthesia: General | Site: Shoulder | Laterality: Left

## 2022-11-14 MED ORDER — ROCURONIUM BROMIDE 10 MG/ML (PF) SYRINGE
PREFILLED_SYRINGE | INTRAVENOUS | Status: AC
  Filled 2022-11-14: qty 10

## 2022-11-14 MED ORDER — CHLORHEXIDINE GLUCONATE 0.12 % MT SOLN: 15.0000 mL | Freq: Once | OROMUCOSAL | Status: AC

## 2022-11-14 MED ORDER — GLYCOPYRROLATE 0.2 MG/ML IJ SOLN
INTRAMUSCULAR | Status: AC
  Filled 2022-11-14: qty 1

## 2022-11-14 MED ORDER — CEFAZOLIN SODIUM-DEXTROSE 2-4 GM/100ML-% IV SOLN
2.0000 g | Freq: Four times a day (QID) | INTRAVENOUS | Status: AC
  Administered 2022-11-14 – 2022-11-15 (×3): 2 g via INTRAVENOUS
  Filled 2022-11-14 (×3): qty 100

## 2022-11-14 MED ORDER — MIDAZOLAM HCL 2 MG/2ML IJ SOLN: 1.0000 mg | INTRAMUSCULAR | Status: DC | PRN

## 2022-11-14 MED ORDER — FLEET ENEMA 7-19 GM/118ML RE ENEM: 1.0000 | ENEMA | Freq: Once | RECTAL | Status: DC | PRN

## 2022-11-14 MED ORDER — ATORVASTATIN CALCIUM 20 MG PO TABS
80.0000 mg | ORAL_TABLET | Freq: Every day | ORAL | Status: DC
  Administered 2022-11-15: 80 mg via ORAL
  Filled 2022-11-14: qty 4

## 2022-11-14 MED ORDER — CEFAZOLIN SODIUM-DEXTROSE 2-4 GM/100ML-% IV SOLN
2.0000 g | INTRAVENOUS | Status: AC
  Administered 2022-11-14: 2 g via INTRAVENOUS

## 2022-11-14 MED ORDER — SUGAMMADEX SODIUM 500 MG/5ML IV SOLN
INTRAVENOUS | Status: DC | PRN
  Administered 2022-11-14: 250 mg via INTRAVENOUS

## 2022-11-14 MED ORDER — BUPIVACAINE-EPINEPHRINE (PF) 0.5% -1:200000 IJ SOLN
INTRAMUSCULAR | Status: DC | PRN
  Administered 2022-11-14: 30 mL via PERINEURAL

## 2022-11-14 MED ORDER — PANTOPRAZOLE SODIUM 20 MG PO TBEC
20.0000 mg | DELAYED_RELEASE_TABLET | Freq: Every day | ORAL | Status: DC
  Administered 2022-11-15: 20 mg via ORAL
  Filled 2022-11-14: qty 1

## 2022-11-14 MED ORDER — FENTANYL CITRATE PF 50 MCG/ML IJ SOSY
PREFILLED_SYRINGE | INTRAMUSCULAR | Status: AC
  Administered 2022-11-14: 50 ug via INTRAVENOUS
  Filled 2022-11-14: qty 1

## 2022-11-14 MED ORDER — LEVOTHYROXINE SODIUM 88 MCG PO TABS
88.0000 ug | ORAL_TABLET | Freq: Every morning | ORAL | Status: DC
  Administered 2022-11-15: 88 ug via ORAL
  Filled 2022-11-14: qty 1

## 2022-11-14 MED ORDER — BISACODYL 10 MG RE SUPP: 10.0000 mg | Freq: Every day | RECTAL | Status: DC | PRN

## 2022-11-14 MED ORDER — HYDROCODONE-ACETAMINOPHEN 5-325 MG PO TABS
1.0000 | ORAL_TABLET | ORAL | Status: DC | PRN
  Filled 2022-11-14: qty 2

## 2022-11-14 MED ORDER — KETOCONAZOLE 2 % EX CREA: 1.0000 | TOPICAL_CREAM | Freq: Every day | CUTANEOUS | Status: DC | PRN

## 2022-11-14 MED ORDER — ADULT MULTIVITAMIN W/MINERALS CH
1.0000 | ORAL_TABLET | Freq: Every day | ORAL | Status: DC
  Administered 2022-11-15: 1 via ORAL
  Filled 2022-11-14: qty 1

## 2022-11-14 MED ORDER — TACROLIMUS 0.1 % EX OINT: 1.0000 | TOPICAL_OINTMENT | CUTANEOUS | Status: DC | PRN

## 2022-11-14 MED ORDER — EPHEDRINE SULFATE (PRESSORS) 50 MG/ML IJ SOLN
INTRAMUSCULAR | Status: DC | PRN
  Administered 2022-11-14: 7.5 mg via INTRAVENOUS
  Administered 2022-11-14: 5 mg via INTRAVENOUS
  Administered 2022-11-14: 7.5 mg via INTRAVENOUS

## 2022-11-14 MED ORDER — DEXAMETHASONE SODIUM PHOSPHATE 10 MG/ML IJ SOLN
INTRAMUSCULAR | Status: DC | PRN
  Administered 2022-11-14: 4 mg via INTRAVENOUS

## 2022-11-14 MED ORDER — EPHEDRINE 5 MG/ML INJ
INTRAVENOUS | Status: AC
  Filled 2022-11-14: qty 5

## 2022-11-14 MED ORDER — BUPIVACAINE-EPINEPHRINE (PF) 0.5% -1:200000 IJ SOLN
INTRAMUSCULAR | Status: AC
  Filled 2022-11-14: qty 30

## 2022-11-14 MED ORDER — EZETIMIBE 10 MG PO TABS
10.0000 mg | ORAL_TABLET | Freq: Every day | ORAL | Status: DC
  Administered 2022-11-15: 10 mg via ORAL
  Filled 2022-11-14: qty 1

## 2022-11-14 MED ORDER — ENOXAPARIN SODIUM 40 MG/0.4ML IJ SOSY
40.0000 mg | PREFILLED_SYRINGE | INTRAMUSCULAR | Status: DC
  Administered 2022-11-15: 40 mg via SUBCUTANEOUS
  Filled 2022-11-14: qty 0.4

## 2022-11-14 MED ORDER — BUPIVACAINE LIPOSOME 1.3 % IJ SUSP
INTRAMUSCULAR | Status: DC | PRN
  Administered 2022-11-14: 20 mL

## 2022-11-14 MED ORDER — METOCLOPRAMIDE HCL 5 MG PO TABS: 5.0000 mg | ORAL_TABLET | Freq: Three times a day (TID) | ORAL | Status: DC | PRN

## 2022-11-14 MED ORDER — PROPOFOL 10 MG/ML IV BOLUS
INTRAVENOUS | Status: AC
  Filled 2022-11-14: qty 20

## 2022-11-14 MED ORDER — PROPOFOL 10 MG/ML IV BOLUS
INTRAVENOUS | Status: DC | PRN
  Administered 2022-11-14: 60 mg via INTRAVENOUS
  Administered 2022-11-14: 140 mg via INTRAVENOUS

## 2022-11-14 MED ORDER — GLYCOPYRROLATE 0.2 MG/ML IJ SOLN
INTRAMUSCULAR | Status: DC | PRN
  Administered 2022-11-14: .2 mg via INTRAVENOUS

## 2022-11-14 MED ORDER — MAGNESIUM HYDROXIDE 400 MG/5ML PO SUSP: 30.0000 mL | Freq: Every day | ORAL | Status: DC | PRN

## 2022-11-14 MED ORDER — LIDOCAINE HCL (PF) 2 % IJ SOLN
INTRAMUSCULAR | Status: AC
  Filled 2022-11-14: qty 5

## 2022-11-14 MED ORDER — ACETAMINOPHEN 500 MG PO TABS
500.0000 mg | ORAL_TABLET | Freq: Four times a day (QID) | ORAL | Status: AC
  Administered 2022-11-14 – 2022-11-15 (×4): 500 mg via ORAL
  Filled 2022-11-14 (×5): qty 1

## 2022-11-14 MED ORDER — KETOROLAC TROMETHAMINE 15 MG/ML IJ SOLN
INTRAMUSCULAR | Status: AC
  Administered 2022-11-14: 15 mg via INTRAVENOUS
  Filled 2022-11-14: qty 1

## 2022-11-14 MED ORDER — 0.9 % SODIUM CHLORIDE (POUR BTL) OPTIME
TOPICAL | Status: DC | PRN
  Administered 2022-11-14: 200 mL

## 2022-11-14 MED ORDER — PHENYLEPHRINE HCL-NACL 20-0.9 MG/250ML-% IV SOLN
INTRAVENOUS | Status: DC | PRN
  Administered 2022-11-14: 25 ug/min via INTRAVENOUS

## 2022-11-14 MED ORDER — SODIUM CHLORIDE 0.9 % IR SOLN
Status: DC | PRN
  Administered 2022-11-14: 3000 mL

## 2022-11-14 MED ORDER — DIPHENHYDRAMINE HCL 12.5 MG/5ML PO ELIX: 12.5000 mg | ORAL_SOLUTION | ORAL | Status: DC | PRN

## 2022-11-14 MED ORDER — CHLORHEXIDINE GLUCONATE 0.12 % MT SOLN
OROMUCOSAL | Status: AC
  Administered 2022-11-14: 15 mL via OROMUCOSAL
  Filled 2022-11-14: qty 15

## 2022-11-14 MED ORDER — MIDAZOLAM HCL 2 MG/2ML IJ SOLN
INTRAMUSCULAR | Status: AC
  Filled 2022-11-14: qty 2

## 2022-11-14 MED ORDER — DEXAMETHASONE SODIUM PHOSPHATE 10 MG/ML IJ SOLN
INTRAMUSCULAR | Status: AC
  Filled 2022-11-14: qty 1

## 2022-11-14 MED ORDER — BUPIVACAINE HCL (PF) 0.5 % IJ SOLN
INTRAMUSCULAR | Status: DC | PRN
  Administered 2022-11-14: 10 mL

## 2022-11-14 MED ORDER — VITAMIN D 25 MCG (1000 UNIT) PO TABS
1000.0000 [IU] | ORAL_TABLET | Freq: Every day | ORAL | Status: DC
  Administered 2022-11-14 – 2022-11-15 (×2): 1000 [IU] via ORAL
  Filled 2022-11-14 (×2): qty 1

## 2022-11-14 MED ORDER — KETAMINE HCL 10 MG/ML IJ SOLN
INTRAMUSCULAR | Status: DC | PRN
  Administered 2022-11-14: 10 mg via INTRAVENOUS
  Administered 2022-11-14: 20 mg via INTRAVENOUS

## 2022-11-14 MED ORDER — ANASTROZOLE 1 MG PO TABS
1.0000 mg | ORAL_TABLET | Freq: Every day | ORAL | Status: DC
  Administered 2022-11-15: 1 mg via ORAL
  Filled 2022-11-14 (×2): qty 1

## 2022-11-14 MED ORDER — FENTANYL CITRATE (PF) 100 MCG/2ML IJ SOLN
INTRAMUSCULAR | Status: AC
  Filled 2022-11-14: qty 2

## 2022-11-14 MED ORDER — KETOROLAC TROMETHAMINE 15 MG/ML IJ SOLN: 15.0000 mg | Freq: Once | INTRAMUSCULAR | Status: AC

## 2022-11-14 MED ORDER — KETAMINE HCL 50 MG/5ML IJ SOSY
PREFILLED_SYRINGE | INTRAMUSCULAR | Status: AC
  Filled 2022-11-14: qty 5

## 2022-11-14 MED ORDER — FENTANYL CITRATE (PF) 100 MCG/2ML IJ SOLN
INTRAMUSCULAR | Status: DC | PRN
  Administered 2022-11-14 (×2): 50 ug via INTRAVENOUS

## 2022-11-14 MED ORDER — MIDAZOLAM HCL 2 MG/2ML IJ SOLN
INTRAMUSCULAR | Status: AC
  Administered 2022-11-14: 1 mg via INTRAVENOUS
  Filled 2022-11-14: qty 2

## 2022-11-14 MED ORDER — OCUVITE-LUTEIN PO CAPS
ORAL_CAPSULE | Freq: Every morning | ORAL | Status: DC
  Filled 2022-11-14 (×2): qty 1

## 2022-11-14 MED ORDER — ONDANSETRON HCL 4 MG/2ML IJ SOLN: 4.0000 mg | Freq: Four times a day (QID) | INTRAMUSCULAR | Status: DC | PRN

## 2022-11-14 MED ORDER — FENTANYL CITRATE PF 50 MCG/ML IJ SOSY: 50.0000 ug | PREFILLED_SYRINGE | INTRAMUSCULAR | Status: DC | PRN

## 2022-11-14 MED ORDER — TRANEXAMIC ACID 1000 MG/10ML IV SOLN
INTRAVENOUS | Status: AC
  Filled 2022-11-14: qty 10

## 2022-11-14 MED ORDER — FENTANYL CITRATE (PF) 100 MCG/2ML IJ SOLN: 25.0000 ug | INTRAMUSCULAR | Status: DC | PRN

## 2022-11-14 MED ORDER — LISINOPRIL 20 MG PO TABS
40.0000 mg | ORAL_TABLET | Freq: Every day | ORAL | Status: DC
  Administered 2022-11-15: 40 mg via ORAL
  Filled 2022-11-14: qty 2

## 2022-11-14 MED ORDER — DOCUSATE SODIUM 100 MG PO CAPS
100.0000 mg | ORAL_CAPSULE | Freq: Two times a day (BID) | ORAL | Status: DC
  Administered 2022-11-14 – 2022-11-15 (×2): 100 mg via ORAL
  Filled 2022-11-14 (×2): qty 1

## 2022-11-14 MED ORDER — TRANEXAMIC ACID 1000 MG/10ML IV SOLN
INTRAVENOUS | Status: DC | PRN
  Administered 2022-11-14: 1000 mg via TOPICAL

## 2022-11-14 MED ORDER — ACETAMINOPHEN 10 MG/ML IV SOLN
INTRAVENOUS | Status: DC | PRN
  Administered 2022-11-14: 1000 mg via INTRAVENOUS

## 2022-11-14 MED ORDER — ONDANSETRON HCL 4 MG/2ML IJ SOLN
INTRAMUSCULAR | Status: AC
  Filled 2022-11-14: qty 2

## 2022-11-14 MED ORDER — ORAL CARE MOUTH RINSE: 15.0000 mL | Freq: Once | OROMUCOSAL | Status: AC

## 2022-11-14 MED ORDER — LACTATED RINGERS IV SOLN: INTRAVENOUS | Status: DC

## 2022-11-14 MED ORDER — METOCLOPRAMIDE HCL 5 MG/ML IJ SOLN: 5.0000 mg | Freq: Three times a day (TID) | INTRAMUSCULAR | Status: DC | PRN

## 2022-11-14 MED ORDER — SODIUM CHLORIDE 0.9 % IV SOLN: INTRAVENOUS | Status: DC

## 2022-11-14 MED ORDER — ACETAMINOPHEN 10 MG/ML IV SOLN
INTRAVENOUS | Status: AC
  Filled 2022-11-14: qty 100

## 2022-11-14 MED ORDER — ONDANSETRON HCL 4 MG PO TABS: 4.0000 mg | ORAL_TABLET | Freq: Four times a day (QID) | ORAL | Status: DC | PRN

## 2022-11-14 MED ORDER — ROCURONIUM BROMIDE 100 MG/10ML IV SOLN
INTRAVENOUS | Status: DC | PRN
  Administered 2022-11-14: 70 mg via INTRAVENOUS

## 2022-11-14 MED ORDER — BUPIVACAINE LIPOSOME 1.3 % IJ SUSP
INTRAMUSCULAR | Status: AC
  Filled 2022-11-14: qty 10

## 2022-11-14 MED ORDER — PHENYLEPHRINE HCL-NACL 20-0.9 MG/250ML-% IV SOLN
INTRAVENOUS | Status: AC
  Filled 2022-11-14: qty 250

## 2022-11-14 MED ORDER — CEFAZOLIN SODIUM-DEXTROSE 2-4 GM/100ML-% IV SOLN
INTRAVENOUS | Status: AC
  Filled 2022-11-14: qty 100

## 2022-11-14 MED ORDER — KETOROLAC TROMETHAMINE 15 MG/ML IJ SOLN
7.5000 mg | Freq: Four times a day (QID) | INTRAMUSCULAR | Status: AC
  Administered 2022-11-14 – 2022-11-15 (×4): 7.5 mg via INTRAVENOUS
  Filled 2022-11-14 (×4): qty 1

## 2022-11-14 MED ORDER — MOMETASONE FUROATE 0.1 % EX CREA: 1.0000 | TOPICAL_CREAM | CUTANEOUS | Status: DC | PRN

## 2022-11-14 SURGICAL SUPPLY — 71 items
BASEPLATE BOSS DRILL (MISCELLANEOUS) IMPLANT
BIT DRILL 2.5 (BIT) ×1
BIT DRILL 2.5X4.5XSCR (BIT) IMPLANT
BIT DRILL F/BASEPLATE CENTRAL (BIT) IMPLANT
BIT DRL 2.5X4.5XSCR (BIT) ×1
BLADE SAW SAG 25X90X1.19 (BLADE) ×1 IMPLANT
CHLORAPREP W/TINT 26 (MISCELLANEOUS) ×1 IMPLANT
COOLER POLAR GLACIER W/PUMP (MISCELLANEOUS) ×1 IMPLANT
COVER BACK TABLE REUSABLE LG (DRAPES) ×1 IMPLANT
DRAPE 3/4 80X56 (DRAPES) ×1 IMPLANT
DRAPE INCISE IOBAN 66X45 STRL (DRAPES) ×1 IMPLANT
DRILL BASEPLATE CENTRAL  S (BIT) ×1
DRILL BASEPLATE CENTRAL S (BIT) ×1
DRSG OPSITE POSTOP 4X8 (GAUZE/BANDAGES/DRESSINGS) ×1 IMPLANT
ELECT BLADE 6.5 EXT (BLADE) IMPLANT
ELECT CAUTERY BLADE 6.4 (BLADE) ×1 IMPLANT
ELECT REM PT RETURN 9FT ADLT (ELECTROSURGICAL) ×1
ELECTRODE REM PT RTRN 9FT ADLT (ELECTROSURGICAL) ×1 IMPLANT
GAUZE XEROFORM 1X8 LF (GAUZE/BANDAGES/DRESSINGS) ×1 IMPLANT
GLENOSPHERE RSS 2 CONCENTRIC (Shoulder) IMPLANT
GLOVE BIO SURGEON STRL SZ7.5 (GLOVE) ×4 IMPLANT
GLOVE BIO SURGEON STRL SZ8 (GLOVE) ×4 IMPLANT
GLOVE BIOGEL PI IND STRL 8 (GLOVE) ×2 IMPLANT
GLOVE SURG UNDER LTX SZ8 (GLOVE) ×1 IMPLANT
GOWN STRL REUS W/ TWL LRG LVL3 (GOWN DISPOSABLE) ×1 IMPLANT
GOWN STRL REUS W/ TWL XL LVL3 (GOWN DISPOSABLE) ×1 IMPLANT
GOWN STRL REUS W/TWL LRG LVL3 (GOWN DISPOSABLE) ×1
GOWN STRL REUS W/TWL XL LVL3 (GOWN DISPOSABLE) ×1
GUIDE PIN 2.0 X 150 (WIRE) IMPLANT
HOOD PEEL AWAY T7 (MISCELLANEOUS) ×3 IMPLANT
IV NS IRRIG 3000ML ARTHROMATIC (IV SOLUTION) ×1 IMPLANT
KIT STABILIZATION SHOULDER (MISCELLANEOUS) ×1 IMPLANT
KIT TURNOVER KIT A (KITS) ×1 IMPLANT
LINER STD +3S RSS HXL (Liner) IMPLANT
MANIFOLD NEPTUNE II (INSTRUMENTS) ×1 IMPLANT
MASK FACE SPIDER DISP (MASK) ×1 IMPLANT
MAT ABSORB  FLUID 56X50 GRAY (MISCELLANEOUS) ×1
MAT ABSORB FLUID 56X50 GRAY (MISCELLANEOUS) ×1 IMPLANT
NDL MAYO CATGUT SZ1 (NEEDLE) IMPLANT
NDL SAFETY ECLIP 18X1.5 (MISCELLANEOUS) ×1 IMPLANT
NDL SPNL 20GX3.5 QUINCKE YW (NEEDLE) ×1 IMPLANT
NEEDLE MAYO CATGUT SZ1 (NEEDLE) IMPLANT
NEEDLE SPNL 20GX3.5 QUINCKE YW (NEEDLE) ×1 IMPLANT
NS IRRIG 500ML POUR BTL (IV SOLUTION) ×1 IMPLANT
PACK ARTHROSCOPY SHOULDER (MISCELLANEOUS) ×1 IMPLANT
PAD ARMBOARD 7.5X6 YLW CONV (MISCELLANEOUS) ×1 IMPLANT
PAD WRAPON POLAR SHDR UNIV (MISCELLANEOUS) ×1 IMPLANT
PAD WRAPON POLAR SHDR XLG (MISCELLANEOUS) IMPLANT
PLATE BASE REVERSE RSS S (Plate) IMPLANT
PULSAVAC PLUS IRRIG FAN TIP (DISPOSABLE) ×1
SCREW 4.5X15 RSS W CAP (Screw) IMPLANT
SCREW 4.5X30 RSS W CAP (Screw) IMPLANT
SCREW BODY REVERSE STD (Screw) IMPLANT
SLING ULTRA II M (MISCELLANEOUS) IMPLANT
SPONGE T-LAP 18X18 ~~LOC~~+RFID (SPONGE) ×2 IMPLANT
STAPLER SKIN PROX 35W (STAPLE) ×1 IMPLANT
STEM HUM 11 (Stem) IMPLANT
SUT ETHIBOND 0 MO6 C/R (SUTURE) ×1 IMPLANT
SUT FIBERWIRE #2 38 BLUE 1/2 (SUTURE) ×4
SUT VIC AB 0 CT1 36 (SUTURE) ×1 IMPLANT
SUT VIC AB 2-0 CT1 27 (SUTURE) ×2
SUT VIC AB 2-0 CT1 TAPERPNT 27 (SUTURE) ×2 IMPLANT
SUTURE FIBERWR #2 38 BLUE 1/2 (SUTURE) ×4 IMPLANT
SYR 10ML LL (SYRINGE) ×1 IMPLANT
SYR 30ML LL (SYRINGE) ×1 IMPLANT
SYR TOOMEY 50ML (SYRINGE) ×1 IMPLANT
TIP FAN IRRIG PULSAVAC PLUS (DISPOSABLE) ×1 IMPLANT
TRAP FLUID SMOKE EVACUATOR (MISCELLANEOUS) ×1 IMPLANT
WATER STERILE IRR 500ML POUR (IV SOLUTION) ×1 IMPLANT
WRAPON POLAR PAD SHDR UNIV (MISCELLANEOUS) ×1
WRAPON POLAR PAD SHDR XLG (MISCELLANEOUS) ×1

## 2022-11-14 NOTE — Transfer of Care (Signed)
Immediate Anesthesia Transfer of Care Note  Patient: Bailey Brooks  Procedure(s) Performed: REVERSE SHOULDER ARTHROPLASTY WITH BICEPS TENODESIS. (Left: Shoulder)  Patient Location: PACU  Anesthesia Type:General  Level of Consciousness: drowsy  Airway & Oxygen Therapy: Patient Spontanous Breathing and Patient connected to face mask oxygen  Post-op Assessment: Report given to RN and Post -op Vital signs reviewed and stable  Post vital signs: Reviewed and stable  Last Vitals:  Vitals Value Taken Time  BP 149/65 11/14/22 1345  Temp    Pulse 79 11/14/22 1349  Resp 17 11/14/22 1349  SpO2 100 % 11/14/22 1349  Vitals shown include unvalidated device data.  Last Pain:  Vitals:   11/14/22 0908  TempSrc: Temporal  PainSc: 2          Complications: No notable events documented.

## 2022-11-14 NOTE — Anesthesia Procedure Notes (Signed)
Procedure Name: Intubation Date/Time: 11/14/2022 11:19 AM  Performed by: Lia Foyer, CRNAPre-anesthesia Checklist: Patient identified, Emergency Drugs available, Suction available and Patient being monitored Patient Re-evaluated:Patient Re-evaluated prior to induction Oxygen Delivery Method: Circle system utilized Preoxygenation: Pre-oxygenation with 100% oxygen Induction Type: IV induction Ventilation: Mask ventilation without difficulty Laryngoscope Size: McGraph and 3 Grade View: Grade I Tube type: Oral Tube size: 6.5 mm Number of attempts: 1 Airway Equipment and Method: Stylet, Oral airway and Video-laryngoscopy Placement Confirmation: ETT inserted through vocal cords under direct vision, positive ETCO2 and breath sounds checked- equal and bilateral Secured at: 18 cm Tube secured with: Tape Dental Injury: Teeth and Oropharynx as per pre-operative assessment

## 2022-11-14 NOTE — Plan of Care (Signed)
  Problem: Education: Goal: Knowledge of General Education information will improve Description Including pain rating scale, medication(s)/side effects and non-pharmacologic comfort measures Outcome: Progressing   

## 2022-11-14 NOTE — Anesthesia Procedure Notes (Signed)
Anesthesia Regional Block: Interscalene brachial plexus block   Pre-Anesthetic Checklist: , timeout performed,  Correct Patient, Correct Site, Correct Laterality,  Correct Procedure, Correct Position, site marked,  Risks and benefits discussed,  Surgical consent,  Pre-op evaluation,  At surgeon's request and post-op pain management  Laterality: Left  Prep: chloraprep       Needles:  Injection technique: Single-shot  Needle Type: Echogenic Needle     Needle Length: 4cm  Needle Gauge: 25     Additional Needles:   Procedures:,,,, ultrasound used (permanent image in chart),,    Narrative:  Start time: 11/14/2022 10:04 AM End time: 11/14/2022 10:06 AM Injection made incrementally with aspirations every 5 mL.  Performed by: Personally  Anesthesiologist: Dimas Millin, MD  Additional Notes: Patient's chart reviewed and they were deemed appropriate candidate for procedure, at surgeon's request. Patient educated about risks, benefits, and alternatives of the block including but not limited to: temporary or permanent nerve damage, bleeding, infection, damage to surround tissues, pneumothorax, hemidiaphragmatic paralysis, unilateral Horner's syndrome, block failure, local anesthetic toxicity. Patient expressed understanding. A formal time-out was conducted consistent with institution rules.  Monitors were applied, and minimal sedation used (see nursing record). The site was prepped with skin prep and allowed to dry, and sterile gloves were used. A high frequency linear ultrasound probe with probe cover was utilized throughout. C5-7 nerve roots located and appeared anatomically normal, local anesthetic injected around them, and echogenic block needle trajectory was monitored throughout. Aspiration performed every 60m. Lung and blood vessels were avoided. All injections were performed without resistance and free of blood and paresthesias. The patient tolerated the procedure well.  Injectate:  247mexparel + 1039m.5% bupivacaine

## 2022-11-14 NOTE — Anesthesia Preprocedure Evaluation (Signed)
Anesthesia Evaluation  Patient identified by MRN, date of birth, ID band Patient awake    Reviewed: Allergy & Precautions, NPO status , Patient's Chart, lab work & pertinent test results  History of Anesthesia Complications (+) AWARENESS UNDER ANESTHESIA and history of anesthetic complications  Airway Mallampati: IV  TM Distance: >3 FB Neck ROM: full    Dental  (+) Dental Advidsory Given, Poor Dentition   Pulmonary neg pulmonary ROS, neg shortness of breath, neg COPD   Pulmonary exam normal        Cardiovascular hypertension, (-) angina (-) Past MI and (-) CABG negative cardio ROS Normal cardiovascular exam     Neuro/Psych  PSYCHIATRIC DISORDERS      TIA   GI/Hepatic Neg liver ROS,GERD  ,,  Endo/Other  Hypothyroidism    Renal/GU      Musculoskeletal   Abdominal   Peds  Hematology negative hematology ROS (+)   Anesthesia Other Findings Past Medical History: No date: 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 TTE 03/08/2022: EF               60-65%, no rwma, nl RVSF, mild MR, AoV sclerosis with no               stenosis 10/2010: Diffuse large B cell lymphoma (HCC)     Comment:  a.) (+) mesenteric mass and hepatic involvement; b.) s/p              RCHOP + MTX (x6 systemic + x4 intrathecal), c/b renal               failure; c.) s/p BEAM  chemotherapy prior to autologuous               SCT No date: Diverticulosis No date: Eczema No date: Esophagitis No date: Family history of breast cancer No date: Family history of melanoma No date: Family history of prostate cancer No date: GERD (gastroesophageal reflux disease) 06/2011: H/O stem cell transplant ([place])     Comment:  a.) autogolous SCT as adjuvant treatment of DLBCL No date: Hemorrhoids 05/19/2010: History of stress test     Comment:  a.) Myoview 05/19/2010: nl EF, no ischemia/infarct. 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 No date: Tubular adenoma of colon  Past Surgical History: 1985: ABDOMINAL HYSTERECTOMY     Comment:  Hysterectomy-partial 2002: BREAST BIOPSY; Right     Comment:  Neg - AT [facility] 2020: BREAST BIOPSY; Right     Comment:  bx [facility]?, IDC and DCIS 10/16/2019: BREAST LUMPECTOMY; Right     Comment:  IDC and DCIS, negative LN No date: BURR HOLE W/ PLACEMENT OMMAYA RESERVOIR 2012: BURR HOLE W/ PLACEMENT OMMAYA RESERVOIR 04/2013: COLONOSCOPY 04/2013: ESOPHAGOGASTRODUODENOSCOPY 07/02/2018: INJECTION KNEE 2011: INSERTION CENTRAL VENOUS ACCESS DEVICE W/ SUBCUTANEOUS PORT     Comment:  Port a Cath: Right chest Double Lumen, 04-Nov-2010 2012: LIMBAL STEM CELL TRANSPLANT No date: LIVER BIOPSY      Comment:  stage 4B large Bcell lymphoma 10/16/2019: PARTIAL MASTECTOMY WITH NEEDLE LOCALIZATION AND AXILLARY  SENTINEL LYMPH NODE BX; Right     Comment:  Procedure: PARTIAL MASTECTOMY WITH NEEDLE LOCALIZATION               AND AXILLARY SENTINEL LYMPH NODE BX;  Surgeon: Benjamine Sprague, DO;  Location: [facility] ORS;  Service: General;                Laterality: Right; 2016: ROTATOR CUFF REPAIR; Right 06/22/2015: SHOULDER ARTHROSCOPY; Right     Comment:  Procedure: ARTHROSCOPY SHOULDER, parital repair of               rotator cuff, biceps tenodesis, decompression and               debridement;  Surgeon: Corky Mull, MD;  Location: [facility]               ORS;  Service: Orthopedics;  Laterality: Right; 1954: TONSILLECTOMY No date: VULVECTOMY 09/29/2015: VULVECTOMY PARTIAL; N/A     Comment:  Procedure: VULVECTOMY PARTIAL;  Surgeon: Gillis Ends, MD;  Location: [facility] ORS;  Service: Gynecology;                Laterality: N/A;  BMI    Body Mass Index: 42.37 kg/m      Reproductive/Obstetrics negative OB ROS                             Anesthesia Physical Anesthesia Plan  ASA: 3  Anesthesia Plan: General ETT   Post-op Pain Management: Regional block*   Induction: Intravenous  PONV Risk Score and Plan: 3 and Ondansetron, Dexamethasone, Midazolam and Treatment may vary due to age or medical condition  Airway Management Planned: Oral ETT  Additional Equipment:   Intra-op Plan:   Post-operative Plan: Extubation in OR  Informed Consent: I have reviewed the patients History and Physical, chart, labs and discussed the procedure including the risks, benefits and alternatives for the proposed anesthesia with the patient or authorized representative who has indicated his/her understanding and acceptance.     Dental Advisory Given  Plan Discussed with: Anesthesiologist, CRNA  and Surgeon  Anesthesia Plan Comments: (Patient consented  for risks of anesthesia including but not limited to:  - adverse reactions to medications - damage to eyes, teeth, lips or other oral mucosa - nerve damage due to positioning  - sore throat or hoarseness - Damage to heart, brain, nerves, lungs, other parts of body or loss of life  Patient voiced understanding.)       Anesthesia Quick Evaluation

## 2022-11-14 NOTE — H&P (Signed)
History of Present Illness: Bailey Brooks is a 71 y.o. who presents today for a history and physical. She is to undergo a reverse left total shoulder arthroplasty on 11/14/2022. Since her last visit at the clinic she has had no improvement in her condition and desires to continue with the process of having surgery to the left shoulder.  Patient has had left shoulder pain for quite some time. Her left shoulder pain is secondary to a chronic massive irreparable rotator cuff tear with underlying moderate degenerative changes consistent with rotator cuff arthropathy as confirmed by MRI scan.. The patient was last seen for these symptoms 2 months ago. At this visit, she received a steroid injection into the left shoulder which she states provided moderate temporary relief of her symptoms, lasting over a month before her symptoms began to recur. She again notes moderate pain in her left shoulder which she rates at 5/10 on today's visit. She has been taking Tylenol or naproxen as necessary with limited benefit. She notes that her symptoms are worse with any repetitive activities, as well as with activities at or above shoulder level. She also has pain at night, especially if she sleeps on her left side. She also denies any numbness or paresthesias down her arm to her hand. She is quite frustrated by her symptoms and function limitations, and is ready to consider more aggressive treatment options.   Past Medical History: Colon polyp 2014 (adenomatous)  Duodenitis 05/21/2014  Esophagitis  Hiatal hernia 05/21/2014  History of chemotherapy  Hx of adenomatous colonic polyps 01/22/2019  Lymphoma (CMS-HCC)   Past Surgical History: COLONOSCOPY 12/22/2008 (Adenomatous Polyp)  UPPER GASTROINTESTINAL ENDOSCOPY 2014  COLONOSCOPY 03/18/2013 (PH Adenomatous Polyp: CBF 03/2018)  COLONOSCOPY 01/14/2021 (Tubular adenoma/PHx CP/Repeat 29yr/TKT)  EGD 01/14/2021 (Normal EGD/Repeat as needed/TKT)  ABDOMINAL HYSTERECTOMY   BREAST BIOPSY  omayan resevior  stem cell transplant  TONSILLECTOMY   Past Family History: Gallbladder disease Mother  Melanoma Father  Prostate cancer Brother  Gallbladder disease Brother  Breast cancer Sister  Ovarian cancer Neg Hx   Medications: acetaminophen (TYLENOL) 650 MG ER tablet Take 650 mg by mouth 2 (two) times daily as needed  ANASTROZOLE ORAL Take by mouth once daily  aspirin 81 MG EC tablet Take 1 tablet by mouth once daily  atorvastatin (LIPITOR) 40 MG tablet Take 1 tablet by mouth once daily  clobetasoL (TEMOVATE) 0.05 % cream Apply topically continuously as needed  ezetimibe (ZETIA) 10 mg tablet  lansoprazole (PREVACID) 30 MG DR capsule Take 1 capsule (30 mg total) by mouth once daily. 30 capsule 9  levothyroxine (SYNTHROID) 88 MCG tablet Take 88 mcg by mouth once daily Take on an empty stomach with a glass of water at least 30-60 minutes before breakfast.  lisinopriL (ZESTRIL) 40 MG tablet Take 1 tablet by mouth once daily  lutein 10 mg Tab Take 1 tablet by mouth once daily  mometasone (ELOCON) 0.1 % cream 1 Application 2 (two) times daily as needed  multivitamin tablet Take 1 tablet by mouth once daily  naproxen (NAPROSYN) 500 MG tablet Take 500 mg by mouth once as needed  tacrolimus (PROTOPIC) 0.1 % ointment APPLY TOPICALLY TO THE AFFECTED AREA 1 TO 2 TIMES DAILY AS NEEDED FOR ITCHING  clopidogreL (PLAVIX) 75 mg tablet Take 75 mg by mouth once daily (Patient not taking: Reported on 11/07/2022)   Allergies: Iodine Swelling  IV iodine  Morphine Nausea and Hallucination  Adhesive Rash  Including band-aide  Erythromycin Abdominal Pain  Sulfa (Sulfonamide Antibiotics)  Unknown   Review of Systems: A comprehensive 14 point ROS was performed, reviewed, and the pertinent orthopaedic findings are documented in the HPI.  Physical Exam: BP 134/88  Ht 160 cm ('5\' 3"'$ )  Wt (!) 109 kg (240 lb 6.4 oz)  BMI 42.58 kg/m   General: Well-developed well-nourished  female seen in no acute distress.   HEENT: Atraumatic,normocephalic. Pupils are equal and reactive to light. Oropharynx is clear with moist mucosa  Lungs: Clear to auscultation bilaterally   Cardiovascular: Regular rate and rhythm. Normal S1, S2. No murmurs. No appreciable gallops or rubs. Peripheral pulses are palpable.  Abdomen: Soft, non-tender, nondistended. Bowel sounds present  Left shoulder exam: Skin inspection of the left shoulder again is unremarkable. No swelling, erythema, ecchymosis, abrasions, or other skin abnormalities are identified. She describes mild tenderness to palpation over the anterolateral aspect of the shoulder. Actively, she is able to forward flex to 115 degrees, abduct to 100 degrees, and internally rotate to L2. Passively, she can tolerate tolerate forward flexion to 145 degrees and abduction to 135 degrees. At 90 degrees of abduction, she is able to tolerate external rotation to 85 degrees and internal rotation to 60 degrees. She experiences mild pain at the extremes of all motions. She exhibits 4/5 strength with resisted abduction, and 4-4+/5 strength with resisted forward flexion, internal rotation, and external rotation. She describes s mild-moderate pain with resisted abduction more so than with resisted forward flexion. She is neurovascularly intact to the left upper extremity and hand.  Neurological: The patient is alert and oriented Sensation to light touch appears to be intact and within normal limits Gross motor strength appeared to be equal to 5/5  Vascular : Peripheral pulses felt to be palpable. Capillary refill appears to be intact and within normal limits  X-ray: A MRI of the left shoulder demonstrates evidence of a massive rotator cuff tear involving the supraspinatus, infraspinatus, and subscapularis tendons, all of which have torn and retracted from 2.7 to 3.5 cm medially. In addition, there is "associated fatty atrophy of the supraspinatus  and infraspinatus muscles". Moderate degenerative changes of the glenohumeral joint also are noted. The biceps tendon and labrum appear to be intact. Both the films and report were reviewed by myself and discussed with the patient.   Impression: 1. Nontraumatic complete tear of left rotator cuff 2. Degenerative arthrosis left shoulder  Plan:  The treatment options were discussed with the patient. In addition, patient educational materials were provided regarding the diagnosis and treatment options. The patient is frustrated by her symptoms and function limitations, and is now ready to consider more aggressive treatment options. Therefore, I have recommended a surgical procedure, specifically a reverse left total shoulder arthroplasty. The procedure was discussed with the patient, as were the potential risks (including bleeding, infection, nerve and/or blood vessel injury, persistent or recurrent pain, loosening and/or failure of the components, dislocation, need for further surgery, blood clots, strokes, heart attacks and/or arhythmias, pneumonia, etc.) and benefits. The patient states his/her understanding and wishes to proceed. All of the patient's questions and concerns were answered. She can call any time with further concerns. She will follow up post-surgery, routine.    H&P reviewed and patient re-examined. No changes.

## 2022-11-14 NOTE — Evaluation (Signed)
Physical Therapy Evaluation Patient Details Name: Bailey Brooks MRN: [id number] DOB: 24-Jun-1951 Today's Date: 11/14/2022  History of Present Illness  Pt is a 71 y.o. 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   Clinical Impression  Pt was pleasant and motivated to participate during the session and put forth good effort throughout.  Pt required physical assistance to manage her LE's during bed mobility training but did not need physical assist during transfer training from various height surfaces or with ambulation with a SPC.  Pt given frequent verbal cues for proper sequencing with functional tasks to protect her LUE and demonstrated good carryover.  Pt was steady with ambulation with a slow, cautious reciprocal pattern with no overt LOB including during 180 deg turns and while navigating tight spaces.  Pt is expected to make good progress while in acute care and will benefit from HHPT upon discharge to safely address deficits listed in patient problem list for decreased caregiver assistance and eventual return to PLOF.         Recommendations for follow up therapy are one component of a multi-disciplinary discharge planning process, led by the attending physician.  Recommendations may be updated based on patient status, additional functional criteria and insurance authorization.  Follow Up Recommendations Home health PT      Assistance Recommended at Discharge Frequent or constant Supervision/Assistance  Patient can return home with the following  A little help with walking and/or transfers;A little help with bathing/dressing/bathroom;Assistance with cooking/housework;Assist  for transportation;Help with stairs or ramp for entrance    Equipment Recommendations None recommended by PT  Recommendations for Other Services       Functional Status Assessment Patient has had a recent decline in their functional status and demonstrates the ability to make significant improvements in function in a reasonable and predictable amount of time.     Precautions / Restrictions Precautions Precautions: Shoulder;Fall Shoulder Interventions: Shoulder sling/immobilizer;At all times Restrictions Weight Bearing Restrictions: Yes LUE Weight Bearing: Non weight bearing      Mobility  Bed Mobility Overal bed mobility: Needs Assistance Bed Mobility: Sit to Supine       Sit to supine: Mod assist   General bed mobility comments: Mod A for BLE management    Transfers Overall transfer level: Needs assistance Equipment used: Straight cane Transfers: Sit to/from Stand Sit to Stand: Min guard           General transfer comment: Min verbal cues for sequencing with [facility]    Ambulation/Gait Ambulation/Gait assistance: Min guard Gait Distance (Feet): 15 Feet Assistive device: Straight cane Gait Pattern/deviations: Decreased step length - right, Step-through pattern, Decreased step length - left Gait velocity: decreased     General Gait Details: Slow cadence but steady without LOB including during 180 deg turns and navigating tight spaces; mod verbal cues for scanning environment to prevent bumping LUE  Stairs            Wheelchair Mobility    Modified Rankin (Stroke Patients Only)       Balance Overall balance assessment: Needs assistance Sitting-balance support: Feet supported Sitting balance-Leahy Scale: Good     Standing balance support: Single extremity supported Standing balance-Leahy Scale: Good  Pertinent Vitals/Pain Pain Assessment Pain Assessment: No/denies pain    Home Living Family/patient  expects to be discharged to:: Private residence Living Arrangements: Spouse/significant other Available Help at Discharge: Family;Available 24 hours/day Type of Home: House Home Access: Stairs to enter Entrance Stairs-Rails: Psychiatric nurse of Steps: 4 steps with wide bilateral rails to front door, 3 steps without rails in garage which is the more convenient entry   Home Layout: One level Home Equipment: Shower seat - built in;Cane - single point;Grab bars - tub/shower      Prior Function Prior Level of Function : Driving;Independent/Modified Independent             Mobility Comments: Ind amb community distances without an AD, no falls in the last 6 months ADLs Comments: Ind with ADLs but with some difficulty     Hand Dominance   Dominant Hand: Right    Extremity/Trunk Assessment   Upper Extremity Assessment Upper Extremity Assessment: Defer to OT evaluation LUE Deficits / Details: Pt reports entire LUE is still numb from nerve block LUE: Unable to fully assess due to immobilization LUE Sensation: decreased light touch;decreased proprioception LUE Coordination: decreased gross motor    Lower Extremity Assessment Lower Extremity Assessment: Generalized weakness       Communication   Communication: No difficulties  Cognition Arousal/Alertness: Awake/alert Behavior During Therapy: WFL for tasks assessed/performed Overall Cognitive Status: Within Functional Limits for tasks assessed                                          General Comments      Exercises Total Joint Exercises Ankle Circles/Pumps: AROM, Strengthening, Both, 10 reps Quad Sets: Strengthening, Both, 10 reps Gluteal Sets: Strengthening, Both, 10 reps Straight Leg Raises: Strengthening, Both, 5 reps Other Exercises Other Exercises: Sit to/from stand transfer training from multiple height surfaces   Assessment/Plan    PT Assessment Patient needs continued PT  services  PT Problem List Decreased strength;Decreased activity tolerance;Decreased balance;Decreased mobility;Decreased knowledge of use of DME;Decreased knowledge of precautions       PT Treatment Interventions DME instruction;Gait training;Stair training;Functional mobility training;Therapeutic activities;Therapeutic exercise;Balance training;Patient/family education    PT Goals (Current goals can be found in the Care Plan section)  Acute Rehab PT Goals Patient Stated Goal: To be able to reach items better PT Goal Formulation: With patient Time For Goal Achievement: 11/27/22 Potential to Achieve Goals: Good    Frequency BID     Co-evaluation               AM-PAC PT "6 Clicks" Mobility  Outcome Measure Help needed turning from your back to your side while in a flat bed without using bedrails?: A Little Help needed moving from lying on your back to sitting on the side of a flat bed without using bedrails?: A Lot Help needed moving to and from a bed to a chair (including a wheelchair)?: A Lot Help needed standing up from a chair using your arms (e.g., wheelchair or bedside chair)?: A Little Help needed to walk in hospital room?: A Little Help needed climbing 3-5 steps with a railing? : A Lot 6 Click Score: 15    End of Session Equipment Utilized During Treatment: Gait belt Activity Tolerance: Patient tolerated treatment well Patient left: in bed;with call bell/phone within reach;with bed alarm set;with nursing/sitter in room Nurse Communication: Mobility status;Weight bearing status PT  Visit Diagnosis: Difficulty in walking, not elsewhere classified (R26.2);Muscle weakness (generalized) (M62.81)    Time: 1950-9326 PT Time Calculation (min) (ACUTE ONLY): 27 min   Charges:   PT Evaluation $PT Eval Moderate Complexity: 1 Mod PT Treatments $Therapeutic Exercise: 8-22 mins       D. Royetta Asal PT, DPT 11/14/22, 5:20 PM

## 2022-11-14 NOTE — Evaluation (Addendum)
Occupational Therapy Evaluation Patient Details Name: Bailey Brooks MRN: [id number] DOB: [birth year] Today's Date: 11/14/2022   History of Present Illness Pt is a 71 y/o female s/p L TSA on 11/14/22. 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   Clinical Impression   Patient presenting with decreased independence in self care, balance, functional mobility/transfers, and endurance. Prior to admission pt lived with husband, was Mod I for ADLs/IADLs, and independent for functional mobility without an AD. Pt was instructed in polar care mgt, compression stockings mgt, sling/immobilizer mgt, LUE precautions, adaptive strategies for bathing/dressing/toileting/grooming, and positioning for sleep. Pt was also educated on home/routines modifications to maximize falls prevention, safety, and independence. Handout provided. Shoulder sling/immobilizer and polar care adjusted to improve comfort, optimize positioning, and to maximize skin integrity/safety. Pt verbalized understanding of all education provided. Pt will benefit from acute OT to increase overall independence in the areas of ADLs and functional mobility in order to safely discharge home. Pt could benefit from [facility] following D/C to decrease falls risk, improve balance, and maximize independence in self-care within own home environment.   Recommendations for follow up therapy are one component of a multi-disciplinary discharge planning process, led by the attending physician.  Recommendations may be updated based on patient status, additional functional criteria and insurance authorization.   Follow Up Recommendations  Home health OT     Assistance Recommended at Discharge Frequent or constant Supervision/Assistance   Patient can return home with the following A little help with walking  and/or transfers;A lot of help with bathing/dressing/bathroom;Assistance with cooking/housework;Assist for transportation;Help with stairs or ramp for entrance    Functional Status Assessment  Patient has had a recent decline in their functional status and demonstrates the ability to make significant improvements in function in a reasonable and predictable amount of time.  Equipment Recommendations  Other (comment);(reacher)    Recommendations for Other Services       Precautions / Restrictions Precautions Precautions: Shoulder;Fall Shoulder Interventions: Shoulder sling/immobilizer;Off for dressing/bathing/exercises;At all times Restrictions Weight Bearing Restrictions: Yes LUE Weight Bearing: Non weight bearing      Mobility Bed Mobility Overal bed mobility: Needs Assistance Bed Mobility: Supine to Sit     Supine to sit: Mod assist          Transfers Overall transfer level: Needs assistance Equipment used: 1 person hand held assist Transfers: Sit to/from Stand Sit to Stand: Min assist, From elevated surface                  Balance Overall balance assessment: Needs assistance Sitting-balance support: Feet supported Sitting balance-Leahy Scale: Good     Standing balance support: Single extremity supported Standing balance-Leahy Scale: Fair                             ADL either performed or assessed with clinical judgement   ADL Overall ADL's : Needs assistance/impaired Eating/Feeding: Set up;Sitting               Upper Body Dressing : Moderate assistance;Maximal assistance;Sitting Upper Body Dressing Details (indicate cue type and reason): for gown/polar care/sling Lower Body Dressing: Maximal assistance;Sitting/lateral leans Lower Body Dressing Details (indicate cue type and reason): socks Toilet Transfer: Minimal assistance Toilet Transfer Details (indicate cue type and reason): simulated with STS from EOB  Vision Baseline Vision/History: 1 Wears glasses Patient Visual Report: No change from baseline       Perception     Praxis      Pertinent Vitals/Pain Pain Assessment Pain Assessment: Faces Faces Pain Scale: Hurts a little bit Pain Location: LBP (chronic) Pain Descriptors / Indicators: Discomfort, Grimacing Pain Intervention(s): Monitored during session, Repositioned     Hand Dominance Right   Extremity/Trunk Assessment Upper Extremity Assessment Upper Extremity Assessment: Generalized weakness;LUE deficits/detail LUE Deficits / Details: pt reports entire LUE is still numb from nerve block LUE: Unable to fully assess due to immobilization LUE Sensation: decreased light touch;decreased proprioception LUE Coordination: decreased gross motor   Lower Extremity Assessment Lower Extremity Assessment: Generalized weakness       Communication Communication Communication: No difficulties   Cognition Arousal/Alertness: Awake/alert Behavior During Therapy: WFL for tasks assessed/performed Overall Cognitive Status: Within Functional Limits for tasks assessed                                       General Comments       Exercises Other Exercises Other Exercises: OT provided education re: role of OT, OT POC, post acute recs, sitting up for all meals, EOB/OOB mobility with assistance, home/fall safety, LUE NWB, avoid pushing/pulling/lifting activities with L arm, AROM exercises for digits/wrist/elbow, polar care mgt, sling/immobilizer mgt   Shoulder Instructions      Home Living Family/patient expects to be discharged to:: Private residence Living Arrangements: Spouse/significant other Available Help at Discharge: Family;Available 24 hours/day Type of Home: House Home Access: Stairs to enter [employer] of Steps: 4 Entrance Stairs-Rails: Right;Left Home Layout: One level     Bathroom Shower/Tub: Walk-in shower (stand alone tub)    Bathroom Toilet: Standard     Home Equipment: Shower seat - built in;Cane - single point;Grab bars - tub/shower          Prior Functioning/Environment Prior Level of Function : Driving;Independent/Modified Independent             Mobility Comments: Independent no AD, last fall was Feb 2023. ADLs Comments: Mod I ADLs. Reports difficulty with LB ADL but able to do, doesn't wear socks, has slip on shoes, able to stand for ADL/IADL for ~22mn before LBP, reports hx of "fainting spells"        OT Problem List: Decreased strength;Decreased range of motion;Decreased activity tolerance;Impaired balance (sitting and/or standing);Decreased knowledge of use of DME or AE;Decreased knowledge of precautions;Impaired UE functional use;Pain;Impaired sensation      OT Treatment/Interventions: Self-care/ADL training;Therapeutic exercise;Therapeutic activities;Energy conservation;DME and/or AE instruction;Patient/family education;Balance training    OT Goals(Current goals can be found in the care plan section) Acute Rehab OT Goals Patient Stated Goal: return home OT Goal Formulation: With patient Time For Goal Achievement: 11/28/22 Potential to Achieve Goals: Good   OT Frequency: Min 2X/week    Co-evaluation              AM-PAC OT "6 Clicks" Daily Activity     Outcome Measure Help from another person eating meals?: A Little Help from another person taking care of personal grooming?: A Little Help from another person toileting, which includes using toliet, bedpan, or urinal?: A Lot Help from another person bathing (including washing, rinsing, drying)?: A Lot Help from another person to put on and taking off regular upper body clothing?: A Lot Help from another person to put on and taking  off regular lower body clothing?: A Lot 6 Click Score: 14   End of Session Equipment Utilized During Treatment: Other (comment);Gait belt (sling/immobilizer) Nurse Communication: Mobility  status  Activity Tolerance: Patient tolerated treatment well Patient left: in bed;Other (comment) (left sitting EOB with physical therapy)  OT Visit Diagnosis: Unsteadiness on feet (R26.81);Muscle weakness (generalized) (M62.81);Pain Pain - Right/Left: Left Pain - part of body: Shoulder (and back)                Time: 2122-4825 OT Time Calculation (min): 21 min Charges:  OT General Charges $OT Visit: 1 Visit OT Evaluation $OT Eval Low Complexity: 1 Low  [facility] MS, OTR/L ascom [phone]  11/14/22, 5:09 PM

## 2022-11-14 NOTE — Anesthesia Postprocedure Evaluation (Signed)
Anesthesia Post Note  Patient: Bailey Brooks  Procedure(s) Performed: REVERSE SHOULDER ARTHROPLASTY WITH BICEPS TENODESIS. (Left: Shoulder)  Patient location during evaluation: PACU Anesthesia Type: General Level of consciousness: awake and alert Pain management: pain level controlled Vital Signs Assessment: post-procedure vital signs reviewed and stable Respiratory status: spontaneous breathing, nonlabored ventilation, respiratory function stable and patient connected to nasal cannula oxygen Cardiovascular status: blood pressure returned to baseline and stable Postop Assessment: no apparent nausea or vomiting Anesthetic complications: no  No notable events documented.   Last Vitals:  Vitals:   11/14/22 1430 11/14/22 1453  BP: 138/64 (!) 152/69  Pulse: 67 64  Resp: 10 18  Temp:  36.5 C  SpO2: 96% 97%    Last Pain:  Vitals:   11/14/22 1518  TempSrc:   PainSc: 0-No pain                 Dimas Millin

## 2022-11-14 NOTE — Op Note (Signed)
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  Post-Op Diagnosis:   Same.  Procedure:   Reverse left total shoulder arthroplasty with biceps tenodesis.  Surgeon:   Pascal Lux, MD  Assistant:   Cameron Proud, PA-C  Anesthesia:   General endotracheal with an interscalene block using Exparel placed preoperatively by the anesthesiologist.  Findings:   As above.  Complications:   None  EBL:   75 cc  Fluids:   500 cc crystalloid  UOP:   None  TT:   None  Drains:   None  Closure:   Staples  Implants:   All press-fit Integra system with a 11 mm stem, a standard metaphyseal body, a +3 mm humeral platform, a mini baseplate, and a 38 mm concentric +2 mm laterally offset glenosphere.  Brief Clinical Note:   The patient is a 71 year old female with a history of progressively worsening left shoulder pain and weakness. Her symptoms have progressed despite medications, activity modification, etc. Her history and examination consistent with a massive rotator cuff tear with cuff arthropathy and biceps tendinopathy, all of which were confirmed by preoperative MRI scanning. The patient presents at this time for a reverse left total shoulder arthroplasty.  Procedure:   The patient underwent placement of an interscalene block using Exparel by the anesthesiologist in the preoperative holding area before being brought into the operating room and lain in the supine position. The patient then underwent general endotracheal intubation and anesthesia before the patient was repositioned in the beach chair position using the beach chair positioner. The left shoulder and upper extremity were prepped with ChloraPrep solution before being draped sterilely. Preoperative antibiotics were administered.   A timeout was performed to verify the appropriate surgical site before a standard  anterior approach to the shoulder was made through an approximately 4-5 inch incision. The incision was carried down through the subcutaneous tissues to expose the deltopectoral fascia. The interval between the deltoid and pectoralis muscles was identified and this plane developed, retracting the cephalic vein laterally with the deltoid muscle. The conjoined tendon was identified. Its lateral margin was dissected and the Kolbel self-retraining retractor inserted. The "three sisters" were identified and cauterized. Bursal tissues were removed to improve visualization.   The biceps tendon was identified near the inferior aspect of the bicipital groove. A soft tissue tenodesis was performed by attaching the biceps tendon to the adjacent pectoralis major tendon using two #0 Ethibond interrupted sutures. The biceps tendon was then transected just proximal to the tenodesis site. The subscapularis tendon was released from its attachment to the lesser tuberosity 1 cm proximal to its insertion and several tagging sutures placed. The inferior capsule was released with care after identifying and protecting the axillary nerve. The proximal humeral cut was made at approximately 20 of retroversion using the extra-medullary guide.   Attention was redirected to the glenoid. The labrum was debrided circumferentially before the center of the glenoid was identified. The guidewire was drilled into the glenoid neck using the appropriate guide. After verifying its position, it was overreamed with the mini-baseplate reamer to create a flat surface before the stem reamer was utilized. The superior and inferior peg sites were reamed using the appropriate guide to complete the glenoid preparation. The permanent mini-baseplate was impacted into place. It was stabilized with a 15 x 4.5 mm central screw and four peripheral screws. Locking  caps were placed over the superior and inferior screws. The permanent 38 mm concentric glenosphere  with +2 mm of lateral offset was then impacted into place and its Morse taper locking mechanism verified using manual distraction.  Attention was directed to the humeral side. The humeral canal was prepared utilizing the tapered stem reamers sequentially beginning with the 7 mm stem and progressing to an 11 mm stem. This demonstrated a good tight fit. The metaphyseal region was then prepared using the appropriate planar device. The trial stem and standard metaphyseal body were put together on the back table and a trial reduction performed using the +0 mm and +3 mm inserts. With the +3 mm insert, the arm demonstrated excellent range of motion as the hand could be brought across the chest to the opposite shoulder and brought to the top of the patient's head and to the patient's ear. The shoulder appeared stable throughout this range of motion. The joint was dislocated and the trial components removed. The permanent 11 mm stem with the standard body was impacted into place with care taken to maintain the appropriate version. A repeat trial reduction with the +3 mm insert again demonstrated excellent stability with the findings as described above. Therefore, the shoulder was re-dislocated and, after inserting the locking screw to secure the body to the stem, the permanent +3 mm insert impacted into place. After verifying its locking mechanism, the shoulder was relocated using two finger pressure and again placed through a range of motion with the findings as described above.  The wound was copiously irrigated with sterile saline solution using the jet lavage system before a total of 20 cc of Exparel diluted out to 60 cc with normal saline and 30 cc of 0.5% Sensorcaine with epinephrine was injected into the pericapsular and peri-incisional tissues to help with postoperative analgesia. The subscapularis tendon was reapproximated using #2 FiberWire interrupted sutures. The deltopectoral interval was closed using #0  Vicryl interrupted sutures before the subcutaneous tissues were closed using 2-0 Vicryl interrupted sutures. The skin was closed using staples. Prior to closing the skin, 1 g of transexemic acid in 10 cc of normal saline was injected intra-articularly to help with postoperative bleeding. A sterile occlusive dressing was applied to the wound before the arm was placed into a shoulder immobilizer with an abduction pillow. A Polar Care system also was applied to the shoulder. The patient was then transferred back to a hospital bed before being awakened, extubated, and returned to the recovery room in satisfactory condition after tolerating the procedure well.

## 2022-11-15 ENCOUNTER — Encounter: Payer: Self-pay | Admitting: Surgery

## 2022-11-15 DIAGNOSIS — M7522 Bicipital tendinitis, left shoulder: Secondary | ICD-10-CM | POA: Diagnosis not present

## 2022-11-15 DIAGNOSIS — M7582 Other shoulder lesions, left shoulder: Secondary | ICD-10-CM | POA: Diagnosis not present

## 2022-11-15 DIAGNOSIS — M19012 Primary osteoarthritis, left shoulder: Secondary | ICD-10-CM | POA: Diagnosis not present

## 2022-11-15 DIAGNOSIS — Z853 Personal history of malignant neoplasm of breast: Secondary | ICD-10-CM | POA: Diagnosis not present

## 2022-11-15 DIAGNOSIS — Z7982 Long term (current) use of aspirin: Secondary | ICD-10-CM | POA: Diagnosis not present

## 2022-11-15 DIAGNOSIS — Z8673 Personal history of transient ischemic attack (TIA), and cerebral infarction without residual deficits: Secondary | ICD-10-CM | POA: Diagnosis not present

## 2022-11-15 DIAGNOSIS — Z7902 Long term (current) use of antithrombotics/antiplatelets: Secondary | ICD-10-CM | POA: Diagnosis not present

## 2022-11-15 DIAGNOSIS — I10 Essential (primary) hypertension: Secondary | ICD-10-CM | POA: Diagnosis not present

## 2022-11-15 DIAGNOSIS — E039 Hypothyroidism, unspecified: Secondary | ICD-10-CM | POA: Diagnosis not present

## 2022-11-15 DIAGNOSIS — Z79899 Other long term (current) drug therapy: Secondary | ICD-10-CM | POA: Diagnosis not present

## 2022-11-15 DIAGNOSIS — M75122 Complete rotator cuff tear or rupture of left shoulder, not specified as traumatic: Secondary | ICD-10-CM | POA: Diagnosis not present

## 2022-11-15 LAB — CBC
HCT: 36.5 % (ref 36.0–46.0)
Hemoglobin: 11.9 g/dL — ABNORMAL LOW (ref 12.0–15.0)
MCH: 28.5 pg (ref 26.0–34.0)
MCHC: 32.6 g/dL (ref 30.0–36.0)
MCV: 87.3 fL (ref 80.0–100.0)
Platelets: 188 10*3/uL (ref 150–400)
RBC: 4.18 MIL/uL (ref 3.87–5.11)
RDW: 14.3 % (ref 11.5–15.5)
WBC: 10 10*3/uL (ref 4.0–10.5)
nRBC: 0 % (ref 0.0–0.2)

## 2022-11-15 LAB — BASIC METABOLIC PANEL
Anion gap: 6 (ref 5–15)
BUN: 15 mg/dL (ref 8–23)
CO2: 28 mmol/L (ref 22–32)
Calcium: 8.9 mg/dL (ref 8.9–10.3)
Chloride: 106 mmol/L (ref 98–111)
Creatinine, Ser: 0.9 mg/dL (ref 0.44–1.00)
GFR, Estimated: >60 mL/min (ref >60)
Glucose, Bld: 163 mg/dL — ABNORMAL HIGH (ref 70–99)
Potassium: 3.7 mmol/L (ref 3.5–5.1)
Sodium: 140 mmol/L (ref 135–145)

## 2022-11-15 MED ORDER — ONDANSETRON HCL 4 MG PO TABS: 4.0000 mg | ORAL_TABLET | Freq: Four times a day (QID) | ORAL | 0 refills | Status: DC | PRN

## 2022-11-15 MED ORDER — ORAL CARE MOUTH RINSE: 15.0000 mL | OROMUCOSAL | Status: DC | PRN

## 2022-11-15 MED ORDER — ASPIRIN 325 MG PO TBEC: 325.0000 mg | DELAYED_RELEASE_TABLET | Freq: Two times a day (BID) | ORAL | 0 refills | Status: DC

## 2022-11-15 MED ORDER — HYDROCODONE-ACETAMINOPHEN 5-325 MG PO TABS: 1.0000 | ORAL_TABLET | Freq: Four times a day (QID) | ORAL | 0 refills | Status: DC | PRN

## 2022-11-15 MED ORDER — HYDROCODONE-ACETAMINOPHEN 5-325 MG PO TABS: 1.0000 | ORAL_TABLET | ORAL | 0 refills | Status: DC | PRN

## 2022-11-15 MED ORDER — OCUVITE-LUTEIN PO CAPS: 1.0000 | ORAL_CAPSULE | Freq: Every morning | ORAL | Status: DC

## 2022-11-15 MED ORDER — PROSIGHT PO TABS: 1.0000 | ORAL_TABLET | Freq: Every day | ORAL | Status: DC

## 2022-11-15 NOTE — TOC Transition Note (Signed)
Transition of Care [facility]) - CM/SW Discharge Note   Patient Details  Name: Bailey Brooks MRN: [id number] Date of Birth: Apr 07, 1951  [facility] CM/SW Contact:  Quin Hoop, LCSW Phone Number: 11/15/2022, 4:13 PM   Clinical Narrative:    Patient discharged home with home health services provided by [facility].  Services were set up prior to patient receiving surgery.  Information confirmed with patient prior to discharge.   Final next level of care: [place] Barriers to Discharge: No Barriers Identified   Patient Goals and CMS Choice Patient states their goals for this hospitalization and ongoing recovery are:: home      Discharge Placement                       Discharge Plan and Services                  DME Agency:  ([place])       HH Arranged: PT, OT HH Agency: Other - See comment ([place])        Social Determinants of Health (SDOH) Interventions     Readmission Risk Interventions     No data to display

## 2022-11-15 NOTE — Progress Notes (Signed)
Physical Therapy Treatment Patient Details Name: Bailey Brooks MRN: [id number] DOB: Jul 16, 1951 Today's Date: 11/15/2022   History of Present Illness Pt is a 71 y.o. 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    PT Comments    Pt was pleasant and motivated to participate during the session and put forth good effort throughout. Pt required mod A with bed mobility tasks but once sitting at the EOB demonstrated good balance with no adverse symptoms.  Pt required no physical assist with transfers, gait, or stair training but did require occasional cuing for general sequencing per below.  Pt was steady with amb with generally good sequencing with the [facility] and demonstrated good eccentric and concentric control during stair training.  Pt lacked confidence with the stairs, however, and requested additional practice with her husband present in the afternoon.  Pt will benefit from HHPT upon discharge to safely address deficits listed in patient problem list for decreased caregiver assistance and eventual return to PLOF.     Recommendations for follow up therapy are one component of a multi-disciplinary discharge planning process, led by the attending physician.  Recommendations may be updated based on patient status, additional functional criteria and insurance authorization.  Follow Up Recommendations  Home health PT     Assistance Recommended at Discharge Frequent or constant Supervision/Assistance  Patient can return home with the following A little help with walking and/or transfers;A little help with bathing/dressing/bathroom;Assistance with cooking/housework;Assist for  transportation;Help with stairs or ramp for entrance   Equipment Recommendations  None recommended by PT    Recommendations for Other Services       Precautions / Restrictions Precautions Precautions: Shoulder;Fall Shoulder Interventions: Shoulder sling/immobilizer;At all times Restrictions Weight Bearing Restrictions: Yes LUE Weight Bearing: Non weight bearing     Mobility  Bed Mobility Overal bed mobility: Needs Assistance Bed Mobility: Supine to Sit     Supine to sit: Mod assist     General bed mobility comments: Mod A for BLE and trunk management    Transfers Overall transfer level: Needs assistance Equipment used: Straight cane Transfers: Sit to/from Stand Sit to Stand: Min guard           General transfer comment: Min verbal cues for sequencing with [facility]    Ambulation/Gait Ambulation/Gait assistance: Min guard Gait Distance (Feet): 125 Feet Assistive device: Straight cane Gait Pattern/deviations: Decreased step length - right, Step-through pattern, Decreased step length - left Gait velocity: decreased     General Gait Details: Minimally reduced cadence with min verbal cues for sequencing with the SPC and to scan environment to prevent bumping into obstacles with LUE   Stairs Stairs: Yes Stairs assistance: Min guard Stair Management: One rail Right, Alternating pattern, Forwards Number of Stairs: 4 General stair comments: Pt steady ascending and descending steps with min verbal cues for general sequencing   Wheelchair Mobility    Modified Rankin (Stroke Patients Only)       Balance Overall balance assessment: Needs assistance Sitting-balance support: Feet supported Sitting balance-Leahy Scale: Good     Standing balance support: Single extremity supported, During functional activity Standing balance-Leahy Scale: Good  Cognition Arousal/Alertness: Awake/alert Behavior During Therapy: WFL for  tasks assessed/performed Overall Cognitive Status: Within Functional Limits for tasks assessed                                          Exercises Other Exercises Other Exercises: Sit to/from stand transfer training from multiple height surfaces Other Exercises: Car transfer sequencing education    General Comments        Pertinent Vitals/Pain Pain Assessment Pain Assessment: No/denies pain    Home Living                          Prior Function            PT Goals (current goals can now be found in the care plan section) Progress towards PT goals: Progressing toward goals    Frequency    BID      PT Plan Current plan remains appropriate    Co-evaluation              AM-PAC PT "6 Clicks" Mobility   Outcome Measure  Help needed turning from your back to your side while in a flat bed without using bedrails?: A Little Help needed moving from lying on your back to sitting on the side of a flat bed without using bedrails?: A Lot Help needed moving to and from a bed to a chair (including a wheelchair)?: A Lot Help needed standing up from a chair using your arms (e.g., wheelchair or bedside chair)?: A Little Help needed to walk in hospital room?: A Little Help needed climbing 3-5 steps with a railing? : A Little 6 Click Score: 16    End of Session Equipment Utilized During Treatment: Gait belt Activity Tolerance: Patient tolerated treatment well Patient left: in chair;with call bell/phone within reach;with SCD's reapplied;Other (comment) (polar care donned to LUE) Nurse Communication: Mobility status;Weight bearing status PT Visit Diagnosis: Difficulty in walking, not elsewhere classified (R26.2);Muscle weakness (generalized) (M62.81)     Time: 9476-5465 PT Time Calculation (min) (ACUTE ONLY): 30 min  Charges:  $Gait Training: 23-37 mins                    D. Scott Lorry Furber PT, DPT 11/15/22, 10:31 AM

## 2022-11-15 NOTE — Progress Notes (Signed)
OT Cancellation Note  Patient Details Name: Bailey Brooks MRN: [id number] DOB: [birth year]   Cancelled Treatment:    Reason Eval/Treat Not Completed: Other (comment). Upon entering session, pt currently working with physical therapy. Will re-attempt as able.   Doneta Public 11/15/2022, 9:24 AM

## 2022-11-15 NOTE — Discharge Instructions (Signed)
Diet: As you were doing prior to hospitalization  ° °Shower:  May shower but keep the wounds dry, use an occlusive plastic wrap, NO SOAKING IN TUB.  If the bandage gets wet, change with a clean dry gauze. ° °Dressing:  You may change your dressing as needed. Change the dressing with sterile gauze dressing.   ° °Activity:  Increase activity slowly as tolerated, but follow the weight bearing instructions below.  No lifting or driving for 6 weeks. ° °Weight Bearing:   Non-weightbearing to the left arm. ° °To prevent constipation: you may use a stool softener such as - ° °Colace (over the counter) 100 mg by mouth twice a day  °Drink plenty of fluids (prune juice may be helpful) and high fiber foods °Miralax (over the counter) for constipation as needed.   ° °Itching:  If you experience itching with your medications, try taking only a single pain pill, or even half a pain pill at a time.  You may take up to 10 pain pills per day, and you can also use benadryl over the counter for itching or also to help with sleep.  ° °Precautions:  If you experience chest pain or shortness of breath - call 911 immediately for transfer to the hospital emergency department!! ° °If you develop a fever greater that 101 F, purulent drainage from wound, increased redness or drainage from wound, or calf pain-Call [facility]                                              °Follow- Up Appointment:  Please call for an appointment to be seen in 2 weeks at [facility] °

## 2022-11-15 NOTE — TOC Transition Note (Signed)
Transition of Care [facility]) - CM/SW Discharge Note   Patient Details  Name: Bailey Brooks MRN: [id number] Date of Birth: [birth year]  [facility] CM/SW Contact:  Quin Hoop, LCSW Phone Number: 11/15/2022, 1:01 PM   Clinical Narrative:    Discharge summary in for pt.  Patient set up with [place] for [facility] services.  TOC signing off.    Final next level of care: [place] Barriers to Discharge: No Barriers Identified   Patient Goals and CMS Choice Patient states their goals for this hospitalization and ongoing recovery are:: home      Discharge Placement                       Discharge Plan and Services                          HH Arranged: PT, OT [place] Agency: Other - See comment ([place])        Social Determinants of Health (SDOH) Interventions     Readmission Risk Interventions     No data to display

## 2022-11-15 NOTE — Discharge Summary (Signed)
Physician Discharge Summary  Patient ID: Bailey Brooks MRN: [id number] DOB/AGE: Mar 10, 1951 71 y.o.  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 04/24/2022   Lumbar spondylosis 04/24/2022   TIA (transient ischemic attack) 04/02/2022   Yeast vaginitis 02/06/2022   Rotator cuff tear 12/09/2021   Syncope 09/03/2020   Low serum vitamin B12 05/28/2020   Fatigue 03/24/2020   Genetic testing 03/23/2020   Family history of breast cancer    Family history of prostate cancer    Family history of melanoma    Carcinoma of upper-outer quadrant of right breast in female, estrogen receptor positive ([place]) 10/28/2019   Pain due to onychomycosis of toenails of both feet 06/09/2019   Prediabetes 12/20/2018   Macular degeneration 06/20/2018   GERD (gastroesophageal reflux disease) 06/20/2018   Hypersomnia 06/20/2018   Bruising 12/14/2017   History of lymphoma 06/11/2017   Vulvar lesion 05/16/2017   Low back pain 04/03/2017   History of transient ischemic attack (TIA) 02/10/2017   Hypokalemia 02/10/2017   Left hip pain 08/03/2016   Essential hypertension 07/06/2016   Hypomagnesemia 03/02/2016   Morbid obesity due to excess calories ([place]) 03/02/2016   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   Adjustment disorder with mixed anxiety and depressed mood 05/25/2015   Paget disease, 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   Eczema 02/27/2013   Muscle cramps 11/13/2012   Hypothyroidism 02/20/2012    Past Medical History:  Diagnosis Date   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   Tubular adenoma of colon      Transfusion: None.   Consultants (if any):   Discharged Condition: Improved  Hospital Course: Bailey Brooks is an 71 y.o. female who  was admitted 11/14/2022 with a diagnosis of a massive irreparable rotator cuff tear with cuff arthropathy and biceps tendinopathy of the left shoulder and went to the operating room on 11/14/2022 and underwent the above named procedures.    Surgeries: Procedure(s): REVERSE SHOULDER ARTHROPLASTY WITH BICEPS TENODESIS. on 11/14/2022 Patient tolerated the surgery well. Taken to PACU where she was stabilized and then transferred to the orthopedic floor.  Started on Lovenox 40,g q 24 hrs. Heels elevated on bed with rolled towels. No evidence of DVT. Negative Homan. Physical therapy started on day #1 for gait training and transfer. OT started day #1 for ADL and assisted devices.  Patient's IV was removed on POD1.  Implants: All press-fit Integra system with a 11 mm stem, a standard metaphyseal body, a +3 mm humeral platform, a mini baseplate, and a 38 mm concentric +2 mm laterally offset glenosphere.    She was given perioperative antibiotics:  Anti-infectives (From admission, onward)    Start     Dose/Rate Route Frequency Ordered Stop   11/14/22 1800  ceFAZolin (ANCEF) IVPB 2g/100 mL premix        2 g 200 mL/hr over 30 Minutes Intravenous Every 6 hours 11/14/22 1445 11/15/22 0717   11/14/22 0852  ceFAZolin (ANCEF) 2-4 GM/100ML-% IVPB       Note to Pharmacy: Herby Abraham W: cabinet override      11/14/22 0852 11/14/22 1136   11/14/22 0600  ceFAZolin (ANCEF) IVPB 2g/100 mL premix        2 g 200 mL/hr over 30 Minutes Intravenous On call to O.R. 11/14/22 5643 11/14/22 1129     .  She was given sequential compression devices, early ambulation, and Lovenox for DVT prophylaxis.  She benefited maximally from the hospital stay and there were no complications.    Recent vital signs:  Vitals:   11/14/22 2328 11/15/22 0721  BP: (!) 155/70 (!) 156/74  Pulse: 71 69  Resp: 18 18  Temp: 98.3 F (36.8 C) 98.4 F (36.9 C)  SpO2: 97% 98%    Recent laboratory studies:  Lab Results   Component Value Date   HGB 11.9 (L) 11/15/2022   HGB 13.3 11/06/2022   HGB 14.0 05/05/2022   Lab Results  Component Value Date   WBC 10.0 11/15/2022   PLT 188 11/15/2022   Lab Results  Component Value Date   INR 1.0 05/05/2022   Lab Results  Component Value Date   NA 140 11/15/2022   K 3.7 11/15/2022   CL 106 11/15/2022   CO2 28 11/15/2022   BUN 15 11/15/2022   CREATININE 0.90 11/15/2022   GLUCOSE 163 (H) 11/15/2022    Discharge Medications:   Allergies as of 11/15/2022       Reactions   Morphine And Related Nausea And Vomiting, Nausea Only   Hallucinations   Sulfa Antibiotics Other (See Comments)   Dizzy/Fainting   Iodine Swelling   IV iodine (states now that this can be tolerated with Benadryl)  Adhesive [tape] Rash, Other (See Comments)   Including Bandaids-paper tape ok to use   Erythromycin Nausea And Vomiting   Oxycodone Nausea And Vomiting        Medication List     TAKE these medications    acetaminophen 500 MG tablet Commonly known as: TYLENOL Take 1,000 mg by mouth daily at 6 (six) AM.   anastrozole 1 MG tablet Commonly known as: ARIMIDEX Take 1 tablet (1 mg total) by mouth daily.   aspirin EC 325 MG tablet Take 1 tablet (325 mg total) by mouth 2 (two) times daily. What changed:  medication strength how much to take when to take this   atorvastatin 80 MG tablet Commonly known as: LIPITOR TAKE 1 TABLET BY MOUTH DAILY What changed:  how much to take how to take this when to take this   cholecalciferol 25 MCG (1000 UNIT) tablet Commonly known as: VITAMIN D3 Take 1,000 Units by mouth daily.   ezetimibe 10 MG tablet Commonly known as: ZETIA TAKE ONE TABLET BY MOUTH EVERY DAY What changed:  how much to take how to take this when to take this   HYDROcodone-acetaminophen 5-325 MG tablet Commonly known as: NORCO/VICODIN Take 1-2 tablets by mouth every 6 (six) hours as needed for severe pain.   ICAPS LUTEIN & ZEAXANTHIN  PO Take 1 tablet by mouth every morning. Zeaxanthin 28m/Lutein 116m  ketoconazole 2 % cream Commonly known as: NIZORAL Apply twice daily as directed. What changed:  how much to take when to take this reasons to take this   lansoprazole 30 MG capsule Commonly known as: PREVACID TAKE 1 CAPSULE BY MOUTH EVERY DAY What changed:  how much to take how to take this when to take this   levothyroxine 88 MCG tablet Commonly known as: SYNTHROID Take 1 tablet (88 mcg total) by mouth every morning. on an empty stomach   lisinopril 40 MG tablet Commonly known as: ZESTRIL Take 1 tablet (40 mg total) by mouth daily. What changed: when to take this   mometasone 0.1 % cream Commonly known as: ELOCON Apply to itchy spots on body 1-2 times until improved. Avoid face, groin, underarms. What changed:  how much to take how to take this when to take this reasons to take this   multivitamin tablet Take 1 tablet by mouth daily.   naproxen 500 MG tablet Commonly known as: NAPROSYN TAKE ONE TABLET BY MOUTH TWICE DAILY AS NEEDED FOR MODERATE PAIN. TAKE WITH FOOD. What changed:  how much to take how to take this when to take this   ondansetron 4 MG tablet Commonly known as: ZOFRAN Take 1 tablet (4 mg total) by mouth every 6 (six) hours as needed for nausea.   tacrolimus 0.1 % ointment Commonly known as: PROTOPIC Apply 1-2 times daily as needed for itch to affected areas. What changed:  how much to take when to take this reasons to take this       Diagnostic Studies: DG Shoulder Left Port  Result Date: 11/14/2022 CLINICAL DATA:  Post reverse shoulder arthroplasty LEFT EXAM: LEFT SHOULDER COMPARISON:  None Available. FINDINGS: Components of reverse shoulder arthroplasty are identified. Osseous demineralization. No fracture or dislocation. IMPRESSION: Post reverse LEFT shoulder arthroplasty. No acute osseous abnormalities. Electronically Signed   By: MaLavonia Dana.D.   On:  11/14/2022 14:11   [place] [facility] ONLY [facility] Result Date: 11/14/2022 There is no interpretation for this exam.  This order is for images obtained  during a surgical procedure.  Please See "Surgeries" Tab for more information regarding the procedure.    Disposition: Plan for possible d/c home today pending progress with PT.   Follow-up Information     Lattie Corns, PA-C Follow up in 14 day(s).   Specialty: Physician Assistant Why: Levert Feinstein removal. Contact information: [place] [address] [phone]                Signed: Judson Roch 11/15/2022, 12:27 PM

## 2022-11-15 NOTE — Progress Notes (Signed)
Occupational Therapy Treatment Patient Details Name: Bailey Brooks MRN: [id number] DOB: May 21, 1951 Today's Date: 11/15/2022   History of present illness Pt is a 71 y.o. 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   OT comments  Upon entering session, pt sitting in recliner and agreeable to OT. Significant other present to receive all education and was involved in ADL session. Education was provided regarding use of reacher for LB dressing, UB dressing compensatory techniques (seesaw method, first in last out), and HEP for AROM of uninvolved joints of LUE (digits, wrist, elbow) with handouts provided. Pt/significant other verbalized understanding. She required Mod-Max A for UB dressing to doff hospital gown, don t-shirt, don/doff polar care, and don/doff sling. Pt left as received with all needs in reach. Pt is making progress toward goal completion. D/C recommendation remains appropriate. OT will continue to follow acutely.    Recommendations for follow up therapy are one component of a multi-disciplinary discharge planning process, led by the attending physician.  Recommendations may be updated based on patient status, additional functional criteria and insurance authorization.    Follow Up Recommendations  Home health OT     Assistance Recommended at Discharge Frequent or constant Supervision/Assistance  Patient can return home with the following  A little help with walking and/or transfers;A lot of help with bathing/dressing/bathroom;Assistance with cooking/housework;Assist for transportation;Help with stairs or ramp for entrance   Equipment Recommendations  Other (comment)  (reacher)    Recommendations for Other Services      Precautions / Restrictions Precautions Precautions: Shoulder;Fall Shoulder Interventions: Shoulder sling/immobilizer;At all times Restrictions Weight Bearing Restrictions: Yes LUE Weight Bearing: Non weight bearing       Mobility Bed Mobility Overal bed mobility: Needs Assistance             General bed mobility comments: NT, received/left in recliner    Transfers Overall transfer level: Needs assistance Equipment used: 1 person hand held assist Transfers: Sit to/from Stand Sit to Stand: Supervision                 Balance Overall balance assessment: Needs assistance Sitting-balance support: Feet supported Sitting balance-Leahy Scale: Good                                     ADL either performed or assessed with clinical judgement   ADL Overall ADL's : Needs assistance/impaired                 Upper Body Dressing : Maximal assistance;Sitting;Cueing for compensatory techniques;Standing;Moderate assistance Upper Body Dressing Details (indicate cue type and reason): use of "seesaw method" and "first in, last out" compensatory techniques for doffing hospital gown & donning t-shirt, assistance provided for threading t-shirt over LUE, pt able to pull shirt collar over head. caregiver assisting with donning polar care & sling/immoblizer Lower Body Dressing: Maximal assistance;Sitting/lateral leans;Sit to/from stand;Moderate assistance Lower Body Dressing Details (indicate cue type and reason): pt unable to thread underwear/pants over feet in sitting, discussed use of reacher. caregiver assisting with pulling up underwear/pants in standing                    Extremity/Trunk  Assessment Upper Extremity Assessment Upper Extremity Assessment: LUE deficits/detail LUE Deficits / Details: Pt reports entire LUE is still numb from nerve block LUE Sensation: decreased light touch;decreased  proprioception LUE Coordination: decreased gross motor   Lower Extremity Assessment Lower Extremity Assessment: Generalized weakness        Vision Baseline Vision/History: 1 Wears glasses Patient Visual Report: No change from baseline     Perception     Praxis      Cognition Arousal/Alertness: Awake/alert Behavior During Therapy: WFL for tasks assessed/performed Overall Cognitive Status: Within Functional Limits for tasks assessed                                 General Comments: pt guarding LUE during dressing tasks, seemingly anxious about moving arm        Exercises Other Exercises Other Exercises: Education provided re: use of reacher for LB dressing, UB dressing compensatory techniques (seesaw method, first in last out), HEP for AROM of uninvolved joints of LUE (digits, wrist, elbow), handouts provided    Shoulder Instructions       General Comments      Pertinent Vitals/ Pain       Pain Assessment Pain Assessment: No/denies pain  Home Living                                          Prior Functioning/Environment              Frequency  Min 2X/week        Progress Toward Goals  OT Goals(current goals can now be found in the care plan section)  Progress towards OT goals: Progressing toward goals  Acute Rehab OT Goals Patient Stated Goal: return home OT Goal Formulation: With patient Time For Goal Achievement: 11/28/22 Potential to Achieve Goals: Good  Plan Discharge plan remains appropriate;Frequency remains appropriate    Co-evaluation                 AM-PAC OT "6 Clicks" Daily Activity     Outcome Measure   Help from another person eating meals?: A Little Help from another person taking care of personal grooming?: A Little Help from another person toileting, which includes using toliet, bedpan, or urinal?: A Lot Help from another person bathing (including washing, rinsing, drying)?: A Lot Help  from another person to put on and taking off regular upper body clothing?: A Lot Help from another person to put on and taking off regular lower body clothing?: A Lot 6 Click Score: 14    End of Session Equipment Utilized During Treatment: Other (comment) (polar care, sling/immobilizer)  OT Visit Diagnosis: Unsteadiness on feet (R26.81);Muscle weakness (generalized) (M62.81);Pain Pain - Right/Left: Left Pain - part of body: Shoulder   Activity Tolerance Patient tolerated treatment well   Patient Left in chair;with call bell/phone within reach;with family/visitor present   Nurse Communication Mobility status        Time: 8242-3536 OT Time Calculation (min): 46 min  Charges: OT General Charges $OT Visit: 1 Visit OT Treatments $Self Care/Home Management : 38-52 mins  [facility] MS, OTR/L ascom [phone]  11/15/22, 5:53 PM

## 2022-11-15 NOTE — Progress Notes (Signed)
Subjective: 1 Day Post-Op Procedure(s) (LRB): REVERSE SHOULDER ARTHROPLASTY WITH BICEPS TENODESIS. (Left) Patient reports pain as 0 on 0-10 scale.   Patient is  well,decreased sensation to the left arm from recent interscalene block  Plan is to go Home after hospital stay. Negative for chest pain and shortness of breath Fever: no Gastrointestinal:Negative for nausea and vomiting Reports that she is passing gas this morning.  Objective: Vital signs in last 24 hours: Temp:  [97.1 F (36.2 C)-98.5 F (36.9 C)] 98.4 F (36.9 C) (12/13 0721) Pulse Rate:  [58-76] 69 (12/13 0721) Resp:  [10-20] 18 (12/13 0721) BP: (138-221)/(64-106) 156/74 (12/13 0721) SpO2:  [96 %-100 %] 98 % (12/13 0721) Weight:  [108.5 kg] 108.5 kg (12/12 0908)  Intake/Output from previous day:  Intake/Output Summary (Last 24 hours) at 11/15/2022 0746 Last data filed at 11/15/2022 1610 Gross per 24 hour  Intake 1914.31 ml  Output 75 ml  Net 1839.31 ml    Intake/Output this shift: Total I/O In: 129.7 [I.V.:29.7; IV Piggyback:100] Out: -   Labs: Recent Labs    11/15/22 0144  HGB 11.9*   Recent Labs    11/15/22 0144  WBC 10.0  RBC 4.18  HCT 36.5  PLT 188   Recent Labs    11/15/22 0144  NA 140  K 3.7  CL 106  CO2 28  BUN 15  CREATININE 0.90  GLUCOSE 163*  CALCIUM 8.9   No results for input(s): "LABPT", "INR" in the last 72 hours.   EXAM General - Patient is Alert, Appropriate, and Oriented Extremity - ABD soft Dorsiflexion/Plantar flexion intact Incision: dressing C/D/I No cellulitis present Decreased sensation to light touch to the left arm. Is able to flex and extend her fingers without pain this morning.  Intact cap refill. Dressing/Incision - clean, dry, no drainage to the left arm honeycomb dressing. Motor Function - intact, moving foot and toes well on exam.  Abdomen soft with intact bowel sounds.  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   Tubular adenoma of colon     Assessment/Plan: 1 Day Post-Op Procedure(s) (LRB): REVERSE SHOULDER ARTHROPLASTY WITH BICEPS TENODESIS. (Left) Principal Problem:   Status  post reverse arthroplasty of shoulder, left  Estimated body mass index is 42.37 kg/m as calculated from the following:   Height as of this encounter: _0  (1.6 m).   Weight as of this encounter: 108.5 kg. Advance diet Up with therapy D/C IV fluids when tolerating po intake.  Labs and vitals reviewed this AM, WBC 10.0 this morning. Hg 11.9. Patient is passing gas without pain this morning. Up with therapy today. Plan for possible d/c home today pending progress with PT.  DVT Prophylaxis - Lovenox Non-weightbearing to the left arm.  Raquel Gabor Lusk, PA-C [facility] 11/15/2022, 7:46 AM

## 2022-11-15 NOTE — Progress Notes (Signed)
Physical Therapy Treatment Patient Details Name: Bailey Brooks MRN: [id number] DOB: [birth year] Today's Date: 11/15/2022   History of Present Illness Pt is a 71 y.o. 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    PT Comments    Pt was pleasant and motivated to participate during the session and put forth good effort throughout. Pt continued to require physical assistance with bed mobility tasks with good compliance with LUE WB status.  Pt was steady with transfers, gait, and stair training per below with spouse present for training.  Pt reported no adverse symptoms during the session. Pt will benefit from HHPT upon discharge to safely address deficits listed in patient problem list for decreased caregiver assistance and eventual return to PLOF.     Recommendations for follow up therapy are one component of a multi-disciplinary discharge planning process, led by the attending physician.  Recommendations may be updated based on patient status, additional functional criteria and insurance authorization.  Follow Up Recommendations  Home health PT     Assistance Recommended at Discharge Frequent or constant Supervision/Assistance  Patient can return home with the following A little help with walking and/or transfers;A little help with bathing/dressing/bathroom;Assistance with cooking/housework;Assist for transportation;Help with stairs or ramp for entrance   Equipment Recommendations  None recommended by PT    Recommendations for Other Services       Precautions / Restrictions Precautions Precautions: Shoulder;Fall Shoulder Interventions: Shoulder sling/immobilizer;At all  times Restrictions Weight Bearing Restrictions: Yes LUE Weight Bearing: Non weight bearing     Mobility  Bed Mobility Overal bed mobility: Needs Assistance Bed Mobility: Supine to Sit     Supine to sit: Mod assist     General bed mobility comments: Mod A for BLE and trunk management    Transfers Overall transfer level: Needs assistance Equipment used: Straight cane Transfers: Sit to/from Stand Sit to Stand: Supervision           General transfer comment: Good eccentric and concentric control and stability    Ambulation/Gait Ambulation/Gait assistance: Min guard Gait Distance (Feet): 125 Feet x 2 Assistive device: Straight cane Gait Pattern/deviations: Decreased step length - right, Step-through pattern, Decreased step length - left Gait velocity: decreased     General Gait Details: Minimally reduced cadence with good stability and sequencing with the SPC; min verbal cues to scan environment to prevent bumping into obstacles with LUE   Stairs Stairs: Yes Stairs assistance: Min guard Stair Management: One rail Right, Alternating pattern, Forwards, With cane Number of Stairs: 4 x 3 General stair comments: Pt steady ascending and descending 4 steps x 3 with min verbal cues for general sequencing with practice using rail as well as SPC, spouse present for training   Wheelchair Mobility    Modified Rankin (Stroke Patients Only)       Balance Overall balance assessment: Needs assistance Sitting-balance support: Feet supported Sitting balance-Leahy Scale: Good     Standing balance support: Single extremity supported, During functional activity Standing balance-Leahy Scale: Good                              Cognition Arousal/Alertness: Awake/alert Behavior During Therapy: WFL for tasks assessed/performed Overall Cognitive Status:  Within Functional Limits for tasks assessed                                          Exercises  Other Exercises Other Exercises: Car transfer sequencing education/review with pt and spouse with emphasis on avoiding bumping LUE while entering/exiting vehicle Other Exercises: Car transfer sequencing education    General Comments        Pertinent Vitals/Pain Pain Assessment Pain Assessment: No/denies pain    Home Living                          Prior Function            PT Goals (current goals can now be found in the care plan section) Progress towards PT goals: Progressing toward goals    Frequency    BID      PT Plan Current plan remains appropriate    Co-evaluation              AM-PAC PT "6 Clicks" Mobility   Outcome Measure  Help needed turning from your back to your side while in a flat bed without using bedrails?: A Little Help needed moving from lying on your back to sitting on the side of a flat bed without using bedrails?: A Lot Help needed moving to and from a bed to a chair (including a wheelchair)?: A Lot Help needed standing up from a chair using your arms (e.g., wheelchair or bedside chair)?: A Little Help needed to walk in hospital room?: A Little Help needed climbing 3-5 steps with a railing? : A Little 6 Click Score: 16    End of Session Equipment Utilized During Treatment: Gait belt Activity Tolerance: Patient tolerated treatment well Patient left: in bed;with family/visitor present;with call bell/phone within reach Nurse Communication: Mobility status;Weight bearing status PT Visit Diagnosis: Difficulty in walking, not elsewhere classified (R26.2);Muscle weakness (generalized) (M62.81)     Time: 3614-4315 PT Time Calculation (min) (ACUTE ONLY): 26 min  Charges:  $Gait Training: 23-37 mins                    D. Scott Kizzy Olafson PT, DPT 11/15/22, 3:27 PM

## 2022-11-15 NOTE — Plan of Care (Signed)
  Problem: Clinical Measurements: Goal: Ability to maintain clinical measurements within normal limits will improve Outcome: Progressing   Problem: Elimination: Goal: Will not experience complications related to bowel motility Outcome: Progressing   Problem: Pain Managment: Goal: General experience of comfort will improve Outcome: Progressing   Problem: Safety: Goal: Ability to remain free from injury will improve Outcome: Progressing   Problem: Elimination: Goal: Will not experience complications related to bowel motility Outcome: Progressing   Problem: Coping: Goal: Level of anxiety will decrease Outcome: Progressing

## 2022-11-16 DIAGNOSIS — Z79899 Other long term (current) drug therapy: Secondary | ICD-10-CM | POA: Diagnosis not present

## 2022-11-16 DIAGNOSIS — E039 Hypothyroidism, unspecified: Secondary | ICD-10-CM | POA: Diagnosis not present

## 2022-11-16 DIAGNOSIS — Z7982 Long term (current) use of aspirin: Secondary | ICD-10-CM | POA: Diagnosis not present

## 2022-11-16 DIAGNOSIS — I10 Essential (primary) hypertension: Secondary | ICD-10-CM | POA: Diagnosis not present

## 2022-11-16 DIAGNOSIS — D6481 Anemia due to antineoplastic chemotherapy: Secondary | ICD-10-CM | POA: Diagnosis not present

## 2022-11-16 DIAGNOSIS — Z471 Aftercare following joint replacement surgery: Secondary | ICD-10-CM | POA: Diagnosis not present

## 2022-11-16 DIAGNOSIS — J309 Allergic rhinitis, unspecified: Secondary | ICD-10-CM | POA: Diagnosis not present

## 2022-11-16 DIAGNOSIS — K649 Unspecified hemorrhoids: Secondary | ICD-10-CM | POA: Diagnosis not present

## 2022-11-16 DIAGNOSIS — K449 Diaphragmatic hernia without obstruction or gangrene: Secondary | ICD-10-CM | POA: Diagnosis not present

## 2022-11-16 DIAGNOSIS — G4710 Hypersomnia, unspecified: Secondary | ICD-10-CM | POA: Diagnosis not present

## 2022-11-16 DIAGNOSIS — H3530 Unspecified macular degeneration: Secondary | ICD-10-CM | POA: Diagnosis not present

## 2022-11-16 DIAGNOSIS — M48061 Spinal stenosis, lumbar region without neurogenic claudication: Secondary | ICD-10-CM | POA: Diagnosis not present

## 2022-11-16 DIAGNOSIS — E7800 Pure hypercholesterolemia, unspecified: Secondary | ICD-10-CM | POA: Diagnosis not present

## 2022-11-16 DIAGNOSIS — C50411 Malignant neoplasm of upper-outer quadrant of right female breast: Secondary | ICD-10-CM | POA: Diagnosis not present

## 2022-11-16 DIAGNOSIS — M47816 Spondylosis without myelopathy or radiculopathy, lumbar region: Secondary | ICD-10-CM | POA: Diagnosis not present

## 2022-11-16 DIAGNOSIS — K219 Gastro-esophageal reflux disease without esophagitis: Secondary | ICD-10-CM | POA: Diagnosis not present

## 2022-11-16 DIAGNOSIS — Z791 Long term (current) use of non-steroidal anti-inflammatories (NSAID): Secondary | ICD-10-CM | POA: Diagnosis not present

## 2022-11-16 DIAGNOSIS — R7303 Prediabetes: Secondary | ICD-10-CM | POA: Diagnosis not present

## 2022-11-16 DIAGNOSIS — K5790 Diverticulosis of intestine, part unspecified, without perforation or abscess without bleeding: Secondary | ICD-10-CM | POA: Diagnosis not present

## 2022-11-16 DIAGNOSIS — Z86010 Personal history of colonic polyps: Secondary | ICD-10-CM | POA: Diagnosis not present

## 2022-11-16 DIAGNOSIS — Z96612 Presence of left artificial shoulder joint: Secondary | ICD-10-CM | POA: Diagnosis not present

## 2022-11-16 LAB — SURGICAL PATHOLOGY

## 2022-11-17 DIAGNOSIS — M12812 Other specific arthropathies, not elsewhere classified, left shoulder: Secondary | ICD-10-CM | POA: Insufficient documentation

## 2022-11-18 DIAGNOSIS — D6481 Anemia due to antineoplastic chemotherapy: Secondary | ICD-10-CM | POA: Diagnosis not present

## 2022-11-18 DIAGNOSIS — K5790 Diverticulosis of intestine, part unspecified, without perforation or abscess without bleeding: Secondary | ICD-10-CM | POA: Diagnosis not present

## 2022-11-18 DIAGNOSIS — Z7982 Long term (current) use of aspirin: Secondary | ICD-10-CM | POA: Diagnosis not present

## 2022-11-18 DIAGNOSIS — Z96612 Presence of left artificial shoulder joint: Secondary | ICD-10-CM | POA: Diagnosis not present

## 2022-11-18 DIAGNOSIS — M47816 Spondylosis without myelopathy or radiculopathy, lumbar region: Secondary | ICD-10-CM | POA: Diagnosis not present

## 2022-11-18 DIAGNOSIS — E7800 Pure hypercholesterolemia, unspecified: Secondary | ICD-10-CM | POA: Diagnosis not present

## 2022-11-18 DIAGNOSIS — R7303 Prediabetes: Secondary | ICD-10-CM | POA: Diagnosis not present

## 2022-11-18 DIAGNOSIS — Z791 Long term (current) use of non-steroidal anti-inflammatories (NSAID): Secondary | ICD-10-CM | POA: Diagnosis not present

## 2022-11-18 DIAGNOSIS — M48061 Spinal stenosis, lumbar region without neurogenic claudication: Secondary | ICD-10-CM | POA: Diagnosis not present

## 2022-11-18 DIAGNOSIS — I10 Essential (primary) hypertension: Secondary | ICD-10-CM | POA: Diagnosis not present

## 2022-11-18 DIAGNOSIS — K449 Diaphragmatic hernia without obstruction or gangrene: Secondary | ICD-10-CM | POA: Diagnosis not present

## 2022-11-18 DIAGNOSIS — Z471 Aftercare following joint replacement surgery: Secondary | ICD-10-CM | POA: Diagnosis not present

## 2022-11-18 DIAGNOSIS — J309 Allergic rhinitis, unspecified: Secondary | ICD-10-CM | POA: Diagnosis not present

## 2022-11-18 DIAGNOSIS — Z86010 Personal history of colonic polyps: Secondary | ICD-10-CM | POA: Diagnosis not present

## 2022-11-18 DIAGNOSIS — K649 Unspecified hemorrhoids: Secondary | ICD-10-CM | POA: Diagnosis not present

## 2022-11-18 DIAGNOSIS — H3530 Unspecified macular degeneration: Secondary | ICD-10-CM | POA: Diagnosis not present

## 2022-11-18 DIAGNOSIS — G4710 Hypersomnia, unspecified: Secondary | ICD-10-CM | POA: Diagnosis not present

## 2022-11-18 DIAGNOSIS — K219 Gastro-esophageal reflux disease without esophagitis: Secondary | ICD-10-CM | POA: Diagnosis not present

## 2022-11-18 DIAGNOSIS — E039 Hypothyroidism, unspecified: Secondary | ICD-10-CM | POA: Diagnosis not present

## 2022-11-18 DIAGNOSIS — C50411 Malignant neoplasm of upper-outer quadrant of right female breast: Secondary | ICD-10-CM | POA: Diagnosis not present

## 2022-11-18 DIAGNOSIS — Z79899 Other long term (current) drug therapy: Secondary | ICD-10-CM | POA: Diagnosis not present

## 2022-11-20 DIAGNOSIS — K219 Gastro-esophageal reflux disease without esophagitis: Secondary | ICD-10-CM | POA: Diagnosis not present

## 2022-11-20 DIAGNOSIS — K5790 Diverticulosis of intestine, part unspecified, without perforation or abscess without bleeding: Secondary | ICD-10-CM | POA: Diagnosis not present

## 2022-11-20 DIAGNOSIS — G4710 Hypersomnia, unspecified: Secondary | ICD-10-CM | POA: Diagnosis not present

## 2022-11-20 DIAGNOSIS — E039 Hypothyroidism, unspecified: Secondary | ICD-10-CM | POA: Diagnosis not present

## 2022-11-20 DIAGNOSIS — D6481 Anemia due to antineoplastic chemotherapy: Secondary | ICD-10-CM | POA: Diagnosis not present

## 2022-11-20 DIAGNOSIS — C50411 Malignant neoplasm of upper-outer quadrant of right female breast: Secondary | ICD-10-CM | POA: Diagnosis not present

## 2022-11-20 DIAGNOSIS — Z471 Aftercare following joint replacement surgery: Secondary | ICD-10-CM | POA: Diagnosis not present

## 2022-11-20 DIAGNOSIS — I10 Essential (primary) hypertension: Secondary | ICD-10-CM | POA: Diagnosis not present

## 2022-11-20 DIAGNOSIS — J309 Allergic rhinitis, unspecified: Secondary | ICD-10-CM | POA: Diagnosis not present

## 2022-11-20 DIAGNOSIS — Z96612 Presence of left artificial shoulder joint: Secondary | ICD-10-CM | POA: Diagnosis not present

## 2022-11-20 DIAGNOSIS — Z86010 Personal history of colonic polyps: Secondary | ICD-10-CM | POA: Diagnosis not present

## 2022-11-20 DIAGNOSIS — R7303 Prediabetes: Secondary | ICD-10-CM | POA: Diagnosis not present

## 2022-11-20 DIAGNOSIS — M47816 Spondylosis without myelopathy or radiculopathy, lumbar region: Secondary | ICD-10-CM | POA: Diagnosis not present

## 2022-11-20 DIAGNOSIS — H3530 Unspecified macular degeneration: Secondary | ICD-10-CM | POA: Diagnosis not present

## 2022-11-20 DIAGNOSIS — Z79899 Other long term (current) drug therapy: Secondary | ICD-10-CM | POA: Diagnosis not present

## 2022-11-20 DIAGNOSIS — M48061 Spinal stenosis, lumbar region without neurogenic claudication: Secondary | ICD-10-CM | POA: Diagnosis not present

## 2022-11-20 DIAGNOSIS — E7800 Pure hypercholesterolemia, unspecified: Secondary | ICD-10-CM | POA: Diagnosis not present

## 2022-11-20 DIAGNOSIS — Z7982 Long term (current) use of aspirin: Secondary | ICD-10-CM | POA: Diagnosis not present

## 2022-11-20 DIAGNOSIS — K449 Diaphragmatic hernia without obstruction or gangrene: Secondary | ICD-10-CM | POA: Diagnosis not present

## 2022-11-20 DIAGNOSIS — Z791 Long term (current) use of non-steroidal anti-inflammatories (NSAID): Secondary | ICD-10-CM | POA: Diagnosis not present

## 2022-11-20 DIAGNOSIS — K649 Unspecified hemorrhoids: Secondary | ICD-10-CM | POA: Diagnosis not present

## 2022-11-22 DIAGNOSIS — E039 Hypothyroidism, unspecified: Secondary | ICD-10-CM | POA: Diagnosis not present

## 2022-11-22 DIAGNOSIS — K219 Gastro-esophageal reflux disease without esophagitis: Secondary | ICD-10-CM | POA: Diagnosis not present

## 2022-11-22 DIAGNOSIS — K649 Unspecified hemorrhoids: Secondary | ICD-10-CM | POA: Diagnosis not present

## 2022-11-22 DIAGNOSIS — Z86010 Personal history of colonic polyps: Secondary | ICD-10-CM | POA: Diagnosis not present

## 2022-11-22 DIAGNOSIS — H3530 Unspecified macular degeneration: Secondary | ICD-10-CM | POA: Diagnosis not present

## 2022-11-22 DIAGNOSIS — Z96612 Presence of left artificial shoulder joint: Secondary | ICD-10-CM | POA: Diagnosis not present

## 2022-11-22 DIAGNOSIS — K449 Diaphragmatic hernia without obstruction or gangrene: Secondary | ICD-10-CM | POA: Diagnosis not present

## 2022-11-22 DIAGNOSIS — Z79899 Other long term (current) drug therapy: Secondary | ICD-10-CM | POA: Diagnosis not present

## 2022-11-22 DIAGNOSIS — I10 Essential (primary) hypertension: Secondary | ICD-10-CM | POA: Diagnosis not present

## 2022-11-22 DIAGNOSIS — E7800 Pure hypercholesterolemia, unspecified: Secondary | ICD-10-CM | POA: Diagnosis not present

## 2022-11-22 DIAGNOSIS — Z7982 Long term (current) use of aspirin: Secondary | ICD-10-CM | POA: Diagnosis not present

## 2022-11-22 DIAGNOSIS — Z471 Aftercare following joint replacement surgery: Secondary | ICD-10-CM | POA: Diagnosis not present

## 2022-11-22 DIAGNOSIS — J309 Allergic rhinitis, unspecified: Secondary | ICD-10-CM | POA: Diagnosis not present

## 2022-11-22 DIAGNOSIS — M47816 Spondylosis without myelopathy or radiculopathy, lumbar region: Secondary | ICD-10-CM | POA: Diagnosis not present

## 2022-11-22 DIAGNOSIS — Z791 Long term (current) use of non-steroidal anti-inflammatories (NSAID): Secondary | ICD-10-CM | POA: Diagnosis not present

## 2022-11-22 DIAGNOSIS — R7303 Prediabetes: Secondary | ICD-10-CM | POA: Diagnosis not present

## 2022-11-22 DIAGNOSIS — G4710 Hypersomnia, unspecified: Secondary | ICD-10-CM | POA: Diagnosis not present

## 2022-11-22 DIAGNOSIS — D6481 Anemia due to antineoplastic chemotherapy: Secondary | ICD-10-CM | POA: Diagnosis not present

## 2022-11-22 DIAGNOSIS — C50411 Malignant neoplasm of upper-outer quadrant of right female breast: Secondary | ICD-10-CM | POA: Diagnosis not present

## 2022-11-22 DIAGNOSIS — M48061 Spinal stenosis, lumbar region without neurogenic claudication: Secondary | ICD-10-CM | POA: Diagnosis not present

## 2022-11-22 DIAGNOSIS — K5790 Diverticulosis of intestine, part unspecified, without perforation or abscess without bleeding: Secondary | ICD-10-CM | POA: Diagnosis not present

## 2022-11-24 DIAGNOSIS — M48061 Spinal stenosis, lumbar region without neurogenic claudication: Secondary | ICD-10-CM | POA: Diagnosis not present

## 2022-11-24 DIAGNOSIS — Z471 Aftercare following joint replacement surgery: Secondary | ICD-10-CM | POA: Diagnosis not present

## 2022-11-24 DIAGNOSIS — C50411 Malignant neoplasm of upper-outer quadrant of right female breast: Secondary | ICD-10-CM | POA: Diagnosis not present

## 2022-11-24 DIAGNOSIS — Z79899 Other long term (current) drug therapy: Secondary | ICD-10-CM | POA: Diagnosis not present

## 2022-11-24 DIAGNOSIS — E039 Hypothyroidism, unspecified: Secondary | ICD-10-CM | POA: Diagnosis not present

## 2022-11-24 DIAGNOSIS — Z791 Long term (current) use of non-steroidal anti-inflammatories (NSAID): Secondary | ICD-10-CM | POA: Diagnosis not present

## 2022-11-24 DIAGNOSIS — K649 Unspecified hemorrhoids: Secondary | ICD-10-CM | POA: Diagnosis not present

## 2022-11-24 DIAGNOSIS — Z96612 Presence of left artificial shoulder joint: Secondary | ICD-10-CM | POA: Diagnosis not present

## 2022-11-24 DIAGNOSIS — H3530 Unspecified macular degeneration: Secondary | ICD-10-CM | POA: Diagnosis not present

## 2022-11-24 DIAGNOSIS — G4710 Hypersomnia, unspecified: Secondary | ICD-10-CM | POA: Diagnosis not present

## 2022-11-24 DIAGNOSIS — E7800 Pure hypercholesterolemia, unspecified: Secondary | ICD-10-CM | POA: Diagnosis not present

## 2022-11-24 DIAGNOSIS — R7303 Prediabetes: Secondary | ICD-10-CM | POA: Diagnosis not present

## 2022-11-24 DIAGNOSIS — K219 Gastro-esophageal reflux disease without esophagitis: Secondary | ICD-10-CM | POA: Diagnosis not present

## 2022-11-24 DIAGNOSIS — D6481 Anemia due to antineoplastic chemotherapy: Secondary | ICD-10-CM | POA: Diagnosis not present

## 2022-11-24 DIAGNOSIS — J309 Allergic rhinitis, unspecified: Secondary | ICD-10-CM | POA: Diagnosis not present

## 2022-11-24 DIAGNOSIS — K5790 Diverticulosis of intestine, part unspecified, without perforation or abscess without bleeding: Secondary | ICD-10-CM | POA: Diagnosis not present

## 2022-11-24 DIAGNOSIS — Z7982 Long term (current) use of aspirin: Secondary | ICD-10-CM | POA: Diagnosis not present

## 2022-11-24 DIAGNOSIS — K449 Diaphragmatic hernia without obstruction or gangrene: Secondary | ICD-10-CM | POA: Diagnosis not present

## 2022-11-24 DIAGNOSIS — M47816 Spondylosis without myelopathy or radiculopathy, lumbar region: Secondary | ICD-10-CM | POA: Diagnosis not present

## 2022-11-24 DIAGNOSIS — I10 Essential (primary) hypertension: Secondary | ICD-10-CM | POA: Diagnosis not present

## 2022-11-24 DIAGNOSIS — Z86010 Personal history of colonic polyps: Secondary | ICD-10-CM | POA: Diagnosis not present

## 2022-11-26 DIAGNOSIS — Z79899 Other long term (current) drug therapy: Secondary | ICD-10-CM | POA: Diagnosis not present

## 2022-11-26 DIAGNOSIS — M47816 Spondylosis without myelopathy or radiculopathy, lumbar region: Secondary | ICD-10-CM | POA: Diagnosis not present

## 2022-11-26 DIAGNOSIS — Z471 Aftercare following joint replacement surgery: Secondary | ICD-10-CM | POA: Diagnosis not present

## 2022-11-26 DIAGNOSIS — Z86010 Personal history of colonic polyps: Secondary | ICD-10-CM | POA: Diagnosis not present

## 2022-11-26 DIAGNOSIS — K5790 Diverticulosis of intestine, part unspecified, without perforation or abscess without bleeding: Secondary | ICD-10-CM | POA: Diagnosis not present

## 2022-11-26 DIAGNOSIS — I10 Essential (primary) hypertension: Secondary | ICD-10-CM | POA: Diagnosis not present

## 2022-11-26 DIAGNOSIS — Z7982 Long term (current) use of aspirin: Secondary | ICD-10-CM | POA: Diagnosis not present

## 2022-11-26 DIAGNOSIS — D6481 Anemia due to antineoplastic chemotherapy: Secondary | ICD-10-CM | POA: Diagnosis not present

## 2022-11-26 DIAGNOSIS — J309 Allergic rhinitis, unspecified: Secondary | ICD-10-CM | POA: Diagnosis not present

## 2022-11-26 DIAGNOSIS — E7800 Pure hypercholesterolemia, unspecified: Secondary | ICD-10-CM | POA: Diagnosis not present

## 2022-11-26 DIAGNOSIS — E039 Hypothyroidism, unspecified: Secondary | ICD-10-CM | POA: Diagnosis not present

## 2022-11-26 DIAGNOSIS — K219 Gastro-esophageal reflux disease without esophagitis: Secondary | ICD-10-CM | POA: Diagnosis not present

## 2022-11-26 DIAGNOSIS — G4710 Hypersomnia, unspecified: Secondary | ICD-10-CM | POA: Diagnosis not present

## 2022-11-26 DIAGNOSIS — Z96612 Presence of left artificial shoulder joint: Secondary | ICD-10-CM | POA: Diagnosis not present

## 2022-11-26 DIAGNOSIS — M48061 Spinal stenosis, lumbar region without neurogenic claudication: Secondary | ICD-10-CM | POA: Diagnosis not present

## 2022-11-26 DIAGNOSIS — H3530 Unspecified macular degeneration: Secondary | ICD-10-CM | POA: Diagnosis not present

## 2022-11-26 DIAGNOSIS — K649 Unspecified hemorrhoids: Secondary | ICD-10-CM | POA: Diagnosis not present

## 2022-11-26 DIAGNOSIS — K449 Diaphragmatic hernia without obstruction or gangrene: Secondary | ICD-10-CM | POA: Diagnosis not present

## 2022-11-26 DIAGNOSIS — C50411 Malignant neoplasm of upper-outer quadrant of right female breast: Secondary | ICD-10-CM | POA: Diagnosis not present

## 2022-11-26 DIAGNOSIS — Z791 Long term (current) use of non-steroidal anti-inflammatories (NSAID): Secondary | ICD-10-CM | POA: Diagnosis not present

## 2022-11-26 DIAGNOSIS — R7303 Prediabetes: Secondary | ICD-10-CM | POA: Diagnosis not present

## 2022-11-28 DIAGNOSIS — Z96612 Presence of left artificial shoulder joint: Secondary | ICD-10-CM | POA: Diagnosis not present

## 2022-12-06 ENCOUNTER — Encounter: Payer: Self-pay | Admitting: Cardiology

## 2022-12-06 ENCOUNTER — Ambulatory Visit: Payer: Medicare Other | Attending: Cardiology | Admitting: Cardiology

## 2022-12-06 VITALS — BP 138/85 | HR 68 | Ht 63.0 in | Wt 238.2 lb

## 2022-12-06 DIAGNOSIS — Z471 Aftercare following joint replacement surgery: Secondary | ICD-10-CM | POA: Diagnosis not present

## 2022-12-06 DIAGNOSIS — E785 Hyperlipidemia, unspecified: Secondary | ICD-10-CM

## 2022-12-06 DIAGNOSIS — I10 Essential (primary) hypertension: Secondary | ICD-10-CM | POA: Diagnosis not present

## 2022-12-06 DIAGNOSIS — G459 Transient cerebral ischemic attack, unspecified: Secondary | ICD-10-CM

## 2022-12-06 DIAGNOSIS — I471 Supraventricular tachycardia, unspecified: Secondary | ICD-10-CM | POA: Diagnosis not present

## 2022-12-06 NOTE — Progress Notes (Signed)
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   Hypercholesterolemia    Hypertension    Hypothyroidism    Long term current use of aromatase inhibitor     Morbid obesity ([place])    [place] reservoir present 2012   a.) placed for intrathecal chemotherapy; remains in place as of 11/2022   Paget's disease of vulva ([place]) 05/13/2013   a.) Bx (+) IPN of vulva consistent with Pagets; resected; b.) s/p re-excision 09/29/2015   Pre-diabetes    Syncope    TIA (transient ischemic attack)    a.) s/p TIA 01/02/2011; b.) s/p TIA 02/10/2017; c.) s/p TIA 04/02/2022; d.) 02/2017 event monitor: No significant arrhythmias.   Tubular adenoma of colon    Past Surgical History:  Procedure Laterality Date   ABDOMINAL HYSTERECTOMY  1985   Hysterectomy-partial   BREAST BIOPSY Right 2002   Neg - AT [facility]   BREAST BIOPSY Right 2020   bx done at [facility]?, IDC and DCIS   BREAST LUMPECTOMY Right 10/16/2019   IDC and DCIS, negative LN   BURR HOLE W/ PLACEMENT OMMAYA RESERVOIR     BURR HOLE W/ PLACEMENT OMMAYA RESERVOIR  2012   COLONOSCOPY  04/2013   ESOPHAGOGASTRODUODENOSCOPY  04/2013   INJECTION KNEE  07/02/2018   INSERTION CENTRAL VENOUS ACCESS DEVICE W/ SUBCUTANEOUS PORT  2011   Port a Cath: Right chest Double Lumen, 04-Nov-2010   LIMBAL STEM CELL TRANSPLANT  2012   LIVER BIOPSY     stage 4B large Bcell lymphoma   PARTIAL MASTECTOMY WITH NEEDLE LOCALIZATION AND AXILLARY SENTINEL LYMPH NODE BX Right 10/16/2019   Procedure: PARTIAL MASTECTOMY  WITH NEEDLE LOCALIZATION AND AXILLARY SENTINEL LYMPH NODE BX;  Surgeon: Benjamine Sprague, DO;  Location: [facility] ORS;  Service: General;  Laterality: Right;   REVERSE SHOULDER ARTHROPLASTY Left 11/14/2022   Procedure: REVERSE SHOULDER ARTHROPLASTY WITH BICEPS TENODESIS.;  Surgeon: Corky Mull, MD;  Location: [facility] ORS;  Service: Orthopedics;  Laterality: Left;   ROTATOR CUFF REPAIR Right 2016   SHOULDER ARTHROSCOPY Right 06/22/2015   Procedure: ARTHROSCOPY SHOULDER, parital repair of rotator cuff, biceps tenodesis, decompression and debridement;  Surgeon: Corky Mull, MD;  Location: [facility] ORS;  Service: Orthopedics;  Laterality: Right;    [place] PARTIAL N/A 09/29/2015   Procedure: VULVECTOMY PARTIAL;  Surgeon: Gillis Ends, MD;  Location: [facility] ORS;  Service: Gynecology;  Laterality: N/A;    Allergies  Allergies  Allergen Reactions   Morphine And Related Nausea And Vomiting and Nausea Only    Hallucinations   Sulfa Antibiotics Other (See Comments)    Dizzy/Fainting   Iodine Swelling    IV iodine (states now that this can be tolerated with Benadryl)   Adhesive [Tape] Rash and Other (See Comments)    Including Bandaids-paper tape ok to use   Erythromycin Nausea And Vomiting   Oxycodone Nausea And Vomiting    History of Present Illness    Bailey Brooks 72 year old female with a previously mentioned past medical history including hypertension, obesity, TIA, lymphoma status post chemotherapy and stem cell transplant, osteoarthritis, gastroesophageal reflux disease, Paget's disease, hypothyroidism, chronic fatigue, low back pain, dyspnea.  She has a history of hypertension dating back to at least 2008.  In the setting of an abnormal EKG she underwent stress testing in June 2011, which resulted as normal.  She had significant weight gain following rotator cuff repair in July 2016 with subsequent inactivity.  An echocardiogram in December 2017 showed an EF of 60-65% with mild LVH, G1 DD, trivial MR.  March 2018 she was admitted for TIA.  CT and MRI were negative for stroke.  She underwent outpatient event monitoring that did not show any significant arrhythmia.  Echocardiogram 03/12/2022 revealed LVEF of 60-65%, no regional wall motion abnormalities, mild mitral valve regurgitation.  Outpatient cardiac monitor showed predominant rhythm of sinus with an average rate of 71 bpm, 2 rounds of NSVT with the fastest interval lasting 4 beats with a maximum rate of 197 bpm longest interval lasting 6 beats with an average rate of 153 bpm, 27 episodes of SVT with the fastest episode lasting  5 beats with a maximal rate of 218 beats.  Longest episode lasting 19.3 seconds with an average rate of 112 bpm, rare PACs and PVCs.  There was no evidence of atrial fibrillation or atrial flutter.  At that time she was recommended to start Lopressor 25 mg twice daily.  She was last seen in clinic 05/26/2022 by Dr. Fletcher Anon.  At that time she had no complaints or symptoms related to the recent outpatient heart monitor that she had worn. She was recommended to start Lopressor 25 mg twice daily which she did not start as she stated she did not have any symptoms.  No changes were made to her medication and no further testing was ordered.  She returns to clinic today stating that overall she has been doing well.  She has recently undergone shoulder surgery remains in a sling today.  She denies any chest pain, shortness of breath, dyspnea on exertion, palpitations, or peripheral edema.  She  is continued to remain compliant with her medications.  She did well from surgery and is now starting to undergo physical therapy.  She will remain in the sling until the end of January.  Home Medications    Current Outpatient Medications  Medication Sig Dispense Refill   acetaminophen (TYLENOL) 500 MG tablet Take 1,000 mg by mouth daily at 6 (six) AM.     anastrozole (ARIMIDEX) 1 MG tablet Take 1 tablet (1 mg total) by mouth daily. 90 tablet 1   aspirin EC 81 MG tablet Take 81 mg by mouth daily. Swallow whole.     atorvastatin (LIPITOR) 80 MG tablet TAKE 1 TABLET BY MOUTH DAILY (Patient taking differently: Take 80 mg by mouth every morning. TAKE 1 TABLET BY MOUTH DAILY) 90 tablet 3   cholecalciferol (VITAMIN D3) 25 MCG (1000 UT) tablet Take 1,000 Units by mouth daily.     ezetimibe (ZETIA) 10 MG tablet TAKE ONE TABLET BY MOUTH EVERY DAY (Patient taking differently: Take 10 mg by mouth every morning. TAKE ONE TABLET BY MOUTH EVERY DAY) 90 tablet 3   ketoconazole (NIZORAL) 2 % cream Apply twice daily as directed. (Patient  taking differently: 1 Application daily as needed for irritation. Apply twice daily as directed.) 60 g 2   lansoprazole (PREVACID) 30 MG capsule TAKE 1 CAPSULE BY MOUTH EVERY DAY (Patient taking differently: Take 30 mg by mouth every morning. TAKE 1 CAPSULE BY MOUTH EVERY DAY) 90 capsule 3   levothyroxine (SYNTHROID) 88 MCG tablet Take 1 tablet (88 mcg total) by mouth every morning. on an empty stomach 90 tablet 3   lisinopril (ZESTRIL) 40 MG tablet Take 1 tablet (40 mg total) by mouth daily. (Patient taking differently: Take 40 mg by mouth every morning.) 90 tablet 3   mometasone (ELOCON) 0.1 % cream Apply to itchy spots on body 1-2 times until improved. Avoid face, groin, underarms. (Patient taking differently: Apply 1 Application topically as needed. Apply to itchy spots on body 1-2 times until improved. Avoid face, groin, underarms.) 50 g 3   Multiple Vitamin (MULTIVITAMIN) tablet Take 1 tablet by mouth daily.     naproxen (NAPROSYN) 500 MG tablet TAKE ONE TABLET BY MOUTH TWICE DAILY AS NEEDED FOR MODERATE PAIN. TAKE WITH FOOD. (Patient taking differently: Take 500 mg by mouth every morning. TAKE ONE TABLET BY MOUTH TWICE DAILY AS NEEDED FOR MODERATE PAIN. TAKE WITH FOOD.) 30 tablet 0   Specialty Vitamins Products (ICAPS LUTEIN & ZEAXANTHIN PO) Take 1 tablet by mouth every morning. Zeaxanthin 50m/Lutein 162m    tacrolimus (PROTOPIC) 0.1 % ointment Apply 1-2 times daily as needed for itch to affected areas. (Patient taking differently: 1 Application as needed. Apply 1-2 times daily as needed for itch to affected areas.) 60 g 2   aspirin EC 325 MG tablet Take 1 tablet (325 mg total) by mouth 2 (two) times daily. (Patient not taking: Reported on 12/06/2022) 30 tablet 0   HYDROcodone-acetaminophen (NORCO/VICODIN) 5-325 MG tablet Take 1-2 tablets by mouth every 6 (six) hours as needed for severe pain. (Patient not taking: Reported on 12/06/2022) 40 tablet 0   ondansetron (ZOFRAN) 4 MG tablet Take 1 tablet (4  mg total) by mouth every 6 (six) hours as needed for nausea. (Patient not taking: Reported on 12/06/2022) 30 tablet 0   No current facility-administered medications for this visit.     Family History    Family History  Problem Relation Age of Onset   Diabetes Mother  died @ 38 of MI.   Hypertension Mother    Heart attack Mother    Melanoma Father 110       died of complications r/t melanoma w/ lung mets.   Kidney disease Sister        Kidney removed    Breast cancer Sister 42   Prostate cancer Brother        Prostate - dx in 86's   Heart disease Brother 63       reported MI @ age 55, ? treated w/ TPA->no recurrent CAD, now in 65's.   Hearing loss Maternal Aunt    Cancer Maternal Aunt        pancreatic vs colon cancer   Hearing loss Maternal Grandfather    Other Maternal Grandmother        flu pandemic   Kidney cancer Niece 64       partial nephrectomy   She indicated that her mother is deceased. She indicated that her father is deceased. She indicated that her sister is alive. She indicated that both of her brothers are alive. She indicated that her maternal grandmother is deceased. She indicated that her maternal grandfather is deceased. She indicated that both of her sons are alive. She indicated that her maternal aunt is deceased. She indicated that her niece is alive.  Social History    Social History   Socioeconomic History   Marital status: Married    Spouse name: Not on file   Number of children: 2   Years of education: Not on file   Highest education level: Not on file  Occupational History   Occupation: Warehouse manager of [employer]    Employer: [employer]  Tobacco Use   Smoking status: Never   Smokeless tobacco: Never  Vaping Use   Vaping Use: Never used  Substance and Sexual Activity   Alcohol use: No    Alcohol/week: 0.0 standard drinks of alcohol   Drug use: No   Sexual activity: Not Currently    Partners: Male    Birth  control/protection: Post-menopausal  Other Topics Concern   Not on file  Social History Narrative   Lives in [place] with husband, has 2 grown sons.  No pets.  Retired from [employer].  Activity limited by chronic back pain-sedentary.   Right-handed   Caffeine: occasional caffeine free/diet soda or hot tea   Social Determinants of Health   Financial Resource Strain: Low Risk  (03/13/2022)   Overall Financial Resource Strain (CARDIA)    Difficulty of Paying Living Expenses: Not hard at all  Food Insecurity: No Food Insecurity (11/14/2022)   Hunger Vital Sign    Worried About Running Out of Food in the Last Year: Never true    Ran Out of Food in the Last Year: Never true  Transportation Needs: No Transportation Needs (11/14/2022)   PRAPARE - Hydrologist (Medical): No    Lack of Transportation (Non-Medical): No  Physical Activity: Unknown (03/10/2021)   Exercise Vital Sign    Days of Exercise per Week: 0 days    Minutes of Exercise per Session: Not on file  Stress: No Stress Concern Present (03/13/2022)   [place]    Feeling of Stress : Only a little  Social Connections: Unknown (03/13/2022)   Social Connection and Isolation Panel [NHANES]    Frequency of Communication with Friends and Family: Not on file    Frequency  of Social Gatherings with Friends and Family: Not on file    Attends Religious Services: Not on file    Active Member of Clubs or Organizations: Not on file    Attends Archivist Meetings: Not on file    Marital Status: Married  Intimate Partner Violence: Not At Risk (11/14/2022)   Humiliation, Afraid, Rape, and Kick questionnaire    Fear of Current or Ex-Partner: No    Emotionally Abused: No    Physically Abused: No    Sexually Abused: No     Review of Systems    General:  No chills, fever, night sweats or weight changes.  Endorses  fatigue Cardiovascular:  No chest pain, dyspnea on exertion, edema, orthopnea, palpitations, paroxysmal nocturnal dyspnea. Dermatological: No rash, lesions/masses, endorses dry flaky skin Respiratory: No cough, dyspnea Urologic: No hematuria, dysuria Musculoskeletal: Endorses occasional shoulder discomfort from surgery Abdominal:   No nausea, vomiting, diarrhea, bright red blood per rectum, melena, or hematemesis Neurologic:  No visual changes, wkns, changes in mental status. All other systems reviewed and are otherwise negative except as noted above.   Physical Exam    VS:  BP 138/85 (BP Location: Right Wrist, Patient Position: Sitting, Cuff Size: Normal)   Pulse 68   Ht _0  (1.6 m)   Wt 238 lb 3.2 oz (108 kg)   SpO2 98%   BMI 42.20 kg/m  , BMI Body mass index is 42.2 kg/m.     Vitals:   12/06/22 1507 12/06/22 1520  BP: (!) 168/85 138/85    GEN: Well nourished, well developed, in no acute distress. HEENT: normal.  Glasses on. Neck: Supple, no JVD, carotid bruits, or masses. Cardiac: RRR, II/VI systolic murmur, without rubs, or gallops. No clubbing, cyanosis, edema.  Radials 2+/PT 2+ and equal bilaterally.  Respiratory:  Respirations regular and unlabored, clear to auscultation bilaterally. GI: Soft, nontender, nondistended, BS + x 4. MS: no deformity or atrophy.  Left arm remains in orthopedic sling Skin: warm and dry, no rash. Neuro:  Strength and sensation are intact. Psych: Normal affect.  Accessory Clinical Findings    ECG personally reviewed by me today-no new tracings completed today  Lab Results  Component Value Date   WBC 10.0 11/15/2022   HGB 11.9 (L) 11/15/2022   HCT 36.5 11/15/2022   MCV 87.3 11/15/2022   PLT 188 11/15/2022   Lab Results  Component Value Date   CREATININE 0.90 11/15/2022   BUN 15 11/15/2022   NA 140 11/15/2022   K 3.7 11/15/2022   CL 106 11/15/2022   CO2 28 11/15/2022   Lab Results  Component Value Date   ALT 22 11/06/2022    AST 27 11/06/2022   ALKPHOS 94 11/06/2022   BILITOT 0.8 11/06/2022   Lab Results  Component Value Date   CHOL 101 04/03/2022   HDL 37 (L) 04/03/2022   LDLCALC 40 04/03/2022   LDLDIRECT 54.4 04/02/2022   TRIG 119 04/03/2022   CHOLHDL 2.7 04/03/2022    Lab Results  Component Value Date   HGBA1C 5.7 (A) 11/09/2022    Assessment & Plan   1.  Essential hypertension with blood pressure today of 168/85 with a recheck of 138/85.  Patient states that when she evaluates her blood pressure at home it runs in the 130s as her repeat blood pressure.  She is continued on her lisinopril 40 mg daily.  She requires no refills today.  She has been encouraged to continue to monitor her pressure at  home.  2.  Hyperlipidemia with last LDL of 40.  Unfortunately she has experienced recurrent TIAs.  So she is continued on atorvastatin 80 mg daily  3.  Recurrent TIA with no evidence of cardiac source of embolism.  Echocardiogram was unremarkable.  Heart monitor did not show any evidence of atrial fibrillation did show short runs of SVT.  She continues to remain asymptomatic from the SVT and is continued to decline beta-blocker therapy at this time as well as a loop recorder.  She is continued on aspirin 81 mg daily as well as atorvastatin 80 mg daily and ezetimibe 10 mg daily.  4.  Disposition patient return to clinic to see MD/APP in 6 months or sooner if needed.  Cory Rama, NP 12/06/2022, 5:04 PM

## 2022-12-06 NOTE — Patient Instructions (Signed)

## 2022-12-07 DIAGNOSIS — M25412 Effusion, left shoulder: Secondary | ICD-10-CM | POA: Diagnosis not present

## 2022-12-07 DIAGNOSIS — M25512 Pain in left shoulder: Secondary | ICD-10-CM | POA: Diagnosis not present

## 2022-12-08 DIAGNOSIS — H353121 Nonexudative age-related macular degeneration, left eye, early dry stage: Secondary | ICD-10-CM | POA: Diagnosis not present

## 2022-12-08 DIAGNOSIS — H2513 Age-related nuclear cataract, bilateral: Secondary | ICD-10-CM | POA: Diagnosis not present

## 2022-12-08 DIAGNOSIS — H353111 Nonexudative age-related macular degeneration, right eye, early dry stage: Secondary | ICD-10-CM | POA: Diagnosis not present

## 2022-12-12 DIAGNOSIS — M25512 Pain in left shoulder: Secondary | ICD-10-CM | POA: Diagnosis not present

## 2022-12-12 DIAGNOSIS — M25412 Effusion, left shoulder: Secondary | ICD-10-CM | POA: Diagnosis not present

## 2022-12-18 ENCOUNTER — Other Ambulatory Visit: Payer: Self-pay | Admitting: Family Medicine

## 2022-12-19 DIAGNOSIS — M25512 Pain in left shoulder: Secondary | ICD-10-CM | POA: Diagnosis not present

## 2022-12-19 DIAGNOSIS — M25412 Effusion, left shoulder: Secondary | ICD-10-CM | POA: Diagnosis not present

## 2022-12-26 DIAGNOSIS — M25512 Pain in left shoulder: Secondary | ICD-10-CM | POA: Diagnosis not present

## 2022-12-26 DIAGNOSIS — M25412 Effusion, left shoulder: Secondary | ICD-10-CM | POA: Diagnosis not present

## 2022-12-29 DIAGNOSIS — M19012 Primary osteoarthritis, left shoulder: Secondary | ICD-10-CM | POA: Diagnosis not present

## 2022-12-30 ENCOUNTER — Other Ambulatory Visit: Payer: Self-pay | Admitting: Internal Medicine

## 2023-01-02 DIAGNOSIS — Z96612 Presence of left artificial shoulder joint: Secondary | ICD-10-CM | POA: Diagnosis not present

## 2023-01-03 DIAGNOSIS — M5136 Other intervertebral disc degeneration, lumbar region: Secondary | ICD-10-CM | POA: Diagnosis not present

## 2023-01-03 DIAGNOSIS — M47816 Spondylosis without myelopathy or radiculopathy, lumbar region: Secondary | ICD-10-CM | POA: Diagnosis not present

## 2023-01-04 ENCOUNTER — Other Ambulatory Visit: Payer: Self-pay | Admitting: Family Medicine

## 2023-01-04 DIAGNOSIS — Z96612 Presence of left artificial shoulder joint: Secondary | ICD-10-CM | POA: Diagnosis not present

## 2023-01-04 DIAGNOSIS — M5416 Radiculopathy, lumbar region: Secondary | ICD-10-CM

## 2023-01-10 DIAGNOSIS — Z96612 Presence of left artificial shoulder joint: Secondary | ICD-10-CM | POA: Diagnosis not present

## 2023-01-10 DIAGNOSIS — M25512 Pain in left shoulder: Secondary | ICD-10-CM | POA: Diagnosis not present

## 2023-01-12 DIAGNOSIS — Z96612 Presence of left artificial shoulder joint: Secondary | ICD-10-CM | POA: Diagnosis not present

## 2023-01-15 ENCOUNTER — Other Ambulatory Visit: Payer: Self-pay | Admitting: Family Medicine

## 2023-01-15 ENCOUNTER — Encounter: Payer: Self-pay | Admitting: Radiation Oncology

## 2023-01-15 ENCOUNTER — Ambulatory Visit
Admission: RE | Admit: 2023-01-15 | Discharge: 2023-01-15 | Disposition: A | Discharge status: Home / Self Care | Payer: Medicare Other | Source: Ambulatory Visit | Attending: Radiation Oncology | Admitting: Radiation Oncology

## 2023-01-15 VITALS — BP 135/73 | HR 72 | Temp 98.0°F | Resp 16 | Ht 63.0 in | Wt 246.0 lb

## 2023-01-15 DIAGNOSIS — C50911 Malignant neoplasm of unspecified site of right female breast: Secondary | ICD-10-CM | POA: Diagnosis not present

## 2023-01-15 DIAGNOSIS — E039 Hypothyroidism, unspecified: Secondary | ICD-10-CM

## 2023-01-15 DIAGNOSIS — Z170 Estrogen receptor positive status [ER+]: Secondary | ICD-10-CM | POA: Diagnosis not present

## 2023-01-15 NOTE — Progress Notes (Signed)
Radiation Oncology Follow up Note  Name: Bailey Brooks   Date:   01/15/2023 MRN:  [id number] DOB: 1951/10/16    This 72 y.o. female presents to the clinic today for over 3-year follow-up.  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  Seen today in routine follow-up she is doing well.  She specifically denies breast tenderness cough or bone pain.  She has been having some back pain..  Some the back pain may be related to her use of Arimidex.  Her last mammogram was over a year ago was a BI-RADS 2 benign.  COMPLICATIONS OF TREATMENT: none  FOLLOW UP COMPLIANCE: keeps appointments   PHYSICAL EXAM:  BP 135/73   Pulse 72   Temp 98 F (36.7 C)   Resp 16   Ht 5' 3"$  (1.6 m)   Wt 246 lb (111.6 kg)   BMI 43.58 kg/m  Lungs are clear to A&P cardiac examination essentially unremarkable with regular rate and rhythm. No dominant mass or nodularity is noted in either breast in 2 positions examined. Incision is well-healed. No axillary or supraclavicular adenopathy is appreciated. Cosmetic result is excellent.  Well-developed well-nourished patient in NAD. HEENT reveals PERLA, EOMI, discs not visualized.  Oral cavity is clear. No oral mucosal lesions are identified. Neck is clear without evidence of cervical or supraclavicular adenopathy. Lungs are clear to A&P. Cardiac examination is essentially unremarkable with regular rate and rhythm without murmur rub or thrill. Abdomen is benign with no organomegaly or masses noted. Motor sensory and DTR levels are equal and symmetric in the upper and lower extremities. Cranial nerves II through XII are grossly intact. Proprioception is intact. No peripheral adenopathy or edema is  identified. No motor or sensory levels are noted. Crude visual fields are within normal range.  RADIOLOGY RESULTS: Prior mammogram reviewed compatible with above-stated findings  PLAN: Time patient is doing well with no evidence of disease now out over 3 years.  I reviewed her most remote mammogram and we will review her new mammogram when it becomes available.  She continues on Arimidex without side effect.  I am going to discontinue follow-up care on the patient be happy to reevaluate her anytime should that be indicated.  I would like to take this opportunity to thank you for allowing me to participate in the care of your patient.Noreene Filbert, MD

## 2023-01-17 DIAGNOSIS — Z96612 Presence of left artificial shoulder joint: Secondary | ICD-10-CM | POA: Diagnosis not present

## 2023-01-19 DIAGNOSIS — M25512 Pain in left shoulder: Secondary | ICD-10-CM | POA: Diagnosis not present

## 2023-01-19 DIAGNOSIS — M25412 Effusion, left shoulder: Secondary | ICD-10-CM | POA: Diagnosis not present

## 2023-01-19 DIAGNOSIS — Z96612 Presence of left artificial shoulder joint: Secondary | ICD-10-CM | POA: Diagnosis not present

## 2023-01-22 ENCOUNTER — Other Ambulatory Visit: Payer: Medicare Other

## 2023-01-22 ENCOUNTER — Other Ambulatory Visit: Payer: Self-pay | Admitting: Family Medicine

## 2023-01-24 DIAGNOSIS — Z96612 Presence of left artificial shoulder joint: Secondary | ICD-10-CM | POA: Diagnosis not present

## 2023-01-24 DIAGNOSIS — M25512 Pain in left shoulder: Secondary | ICD-10-CM | POA: Diagnosis not present

## 2023-01-25 ENCOUNTER — Encounter: Payer: Self-pay | Admitting: Internal Medicine

## 2023-01-25 ENCOUNTER — Inpatient Hospital Stay: Payer: Medicare Other | Attending: Obstetrics and Gynecology | Admitting: Internal Medicine

## 2023-01-25 VITALS — BP 157/76 | HR 75 | Temp 99.5°F | Resp 18 | Wt 238.0 lb

## 2023-01-25 DIAGNOSIS — C50411 Malignant neoplasm of upper-outer quadrant of right female breast: Secondary | ICD-10-CM | POA: Diagnosis not present

## 2023-01-25 DIAGNOSIS — Z170 Estrogen receptor positive status [ER+]: Secondary | ICD-10-CM | POA: Diagnosis not present

## 2023-01-25 DIAGNOSIS — Z8673 Personal history of transient ischemic attack (TIA), and cerebral infarction without residual deficits: Secondary | ICD-10-CM | POA: Diagnosis not present

## 2023-01-25 DIAGNOSIS — C519 Malignant neoplasm of vulva, unspecified: Secondary | ICD-10-CM | POA: Insufficient documentation

## 2023-01-25 DIAGNOSIS — Z8042 Family history of malignant neoplasm of prostate: Secondary | ICD-10-CM | POA: Diagnosis not present

## 2023-01-25 DIAGNOSIS — Z90710 Acquired absence of both cervix and uterus: Secondary | ICD-10-CM | POA: Insufficient documentation

## 2023-01-25 DIAGNOSIS — Z8572 Personal history of non-Hodgkin lymphomas: Secondary | ICD-10-CM | POA: Insufficient documentation

## 2023-01-25 DIAGNOSIS — Z803 Family history of malignant neoplasm of breast: Secondary | ICD-10-CM | POA: Insufficient documentation

## 2023-01-25 DIAGNOSIS — Z79811 Long term (current) use of aromatase inhibitors: Secondary | ICD-10-CM | POA: Insufficient documentation

## 2023-01-25 DIAGNOSIS — I10 Essential (primary) hypertension: Secondary | ICD-10-CM | POA: Insufficient documentation

## 2023-01-25 DIAGNOSIS — Z808 Family history of malignant neoplasm of other organs or systems: Secondary | ICD-10-CM | POA: Insufficient documentation

## 2023-01-25 NOTE — Assessment & Plan Note (Addendum)
#   Right breast cancer stage I ER/PRPos; HER-2 negative. Anastrozole [until spring, 2026].; NOV, 2022-bilateral mammogram negative. Awaiting mammogram nov 2023- diagnostic/PCP.   # MSK grade 2-secondary to adjuvant AI; continue Osteo Bi-Flex.stable.   # ? TIA-on monitor [April 2023] on asprin +plavix-  stable.   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 Theora Gianotti.  # Bone density test-February 2023-T-score 0.1-normal limits continue calcium plus vitamin D. Discussed/ and reviewed.   mychart  # DISPOSITION: # Follow up in 6 months- MD; no labs-  Dr.B

## 2023-01-25 NOTE — Progress Notes (Signed)
[facility] OFFICE PROGRESS NOTE  Patient Care Team: Leone Haven, MD as PCP - General (Family Medicine) Wellington Hampshire, MD as PCP - Cardiology (Cardiology) Clent Jacks, RN as Registered Nurse Cammie Sickle, MD as Consulting Physician (Oncology)   Cancer Staging  No matching staging information was found for the patient.   Oncology History Overview Note  # RIGHT BREAST CANCER stage I [pTapN0; G-1; ER-100%; PR-99%; Her-2-NEG; Dr.Sakai]. NO Oncotype;s/p RT [12/29/2019]  # MARCH 1st 2021- Anastrzole;  STOPPED anastrozole [stopped in April, 2023]; June 2nd, 2023- Examestane 25 mg/day.     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   DIAGNOSIS: Right breast  STAGE:   I      ;  GOALS: cure    History of lymphoma  Carcinoma of upper-outer quadrant of right breast in female, estrogen receptor positive ([place])  10/28/2019 Initial Diagnosis   Carcinoma of upper-outer quadrant of right breast in female, estrogen receptor positive ([place])   03/20/2020 Genetic Testing   Negative genetic testing on the multi-cancer panel.  The Multi-Gene Panel offered by [facility] includes sequencing and/or deletion duplication testing of the following 85 genes: AIP, ALK, APC, ATM, AXIN2,BAP1,  BARD1, BLM, BMPR1A, BRCA1, BRCA2, BRIP1, CASR, CDC73, CDH1, CDK4, CDKN1B, CDKN1C, CDKN2A (p14ARF), CDKN2A (p16INK4a), CEBPA, CHEK2, CTNNA1, DICER1, DIS3L2, EGFR (c.2369C>T, p.Thr790Met variant only), EPCAM (Deletion/duplication testing only), FH, FLCN, GATA2, GPC3, GREM1 (Promoter region deletion/duplication testing only), HOXB13 (c.251G>A, p.Gly84Glu), HRAS, KIT, MAX,  MEN1, MET, MITF (c.952G>A, p.Glu318Lys variant only), MLH1, MSH2, MSH3, MSH6, MUTYH, NBN, NF1, NF2, NTHL1, PALB2, PDGFRA, PHOX2B, PMS2, POLD1, POLE, POT1, PRKAR1A, PTCH1, PTEN, RAD50, RAD51C, RAD51D, RB1, RECQL4, RET, RNF43, RUNX1, SDHAF2, SDHA (sequence changes only), SDHB, SDHC, SDHD, SMAD4, SMARCA4, SMARCB1, SMARCE1, STK11, SUFU, TERC, TERT, TMEM127, TP53, TSC1, TSC2, VHL, WRN and WT1.  The report date is March 20, 2020.      INTERVAL HISTORY: Ambulating independently.  Alone.   Bailey Brooks 72 y.o.  female pleasant patient stage I ER/PR positive HER-2 negative breast cancer most r currently on adjuvant anatraszole is here for follow-up.  Patient s/p shoulder surgery- currently on PT twice a week.   Pt taking her anastrozole. Has aching in her joints and fatigue from this medication. Takes naproxen as needed for the pain.   Appetite is good. Denies any breast pain. No new lumps in breast. Last mammogram was Nov 2022.     Review of Systems  Constitutional:  Positive for malaise/fatigue. Negative for chills, diaphoresis and fever.  HENT:  Negative for nosebleeds and sore throat.   Eyes:  Negative for double vision.  Respiratory:  Negative for cough, hemoptysis, sputum production, shortness of breath and wheezing.   Cardiovascular:  Negative for chest pain, palpitations, orthopnea and leg swelling.  Gastrointestinal:  Negative for abdominal pain, blood in stool, constipation, diarrhea, heartburn, melena, nausea and vomiting.  Genitourinary:  Negative for dysuria, frequency and urgency.  Musculoskeletal:  Positive for back pain and joint pain.  Skin: Negative.  Negative for itching and rash.  Neurological:  Negative for dizziness, tingling, focal weakness, weakness and headaches.  Endo/Heme/Allergies:  Does not bruise/bleed easily.  Psychiatric/Behavioral:  Negative for depression. The patient is not nervous/anxious and does not have insomnia.  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   Hypercholesterolemia    Hypertension    Hypothyroidism    Long term current use of aromatase inhibitor    Morbid obesity ([place])    [place] reservoir present 2012   a.) placed for intrathecal chemotherapy; remains in place as of 11/2022   Paget's disease of vulva ([place]) 05/13/2013   a.) Bx (+) IPN of vulva consistent with Pagets; resected; b.) s/p re-excision 09/29/2015   Pre-diabetes    Syncope    TIA (transient ischemic attack)    a.) s/p TIA 01/02/2011; b.) s/p TIA 02/10/2017; c.) s/p TIA 04/02/2022; d.) 02/2017 event monitor: No significant arrhythmias.   Tubular adenoma of colon     PAST SURGICAL HISTORY :   Past Surgical History:  Procedure Laterality  Date   ABDOMINAL HYSTERECTOMY  1985   Hysterectomy-partial   BREAST BIOPSY Right 2002   Neg - AT [facility]   BREAST BIOPSY Right 2020   bx done at [facility]?, IDC and DCIS   BREAST LUMPECTOMY Right 10/16/2019   IDC and DCIS, negative LN   BURR HOLE W/ PLACEMENT OMMAYA RESERVOIR     BURR HOLE W/ PLACEMENT OMMAYA RESERVOIR  2012   COLONOSCOPY  04/2013   ESOPHAGOGASTRODUODENOSCOPY  04/2013   INJECTION KNEE  07/02/2018   INSERTION CENTRAL VENOUS ACCESS DEVICE W/ SUBCUTANEOUS PORT  2011   Port a Cath: Right chest Double Lumen, 04-Nov-2010   LIMBAL STEM CELL TRANSPLANT  2012   LIVER BIOPSY     stage 4B large Bcell lymphoma   PARTIAL MASTECTOMY WITH NEEDLE LOCALIZATION AND AXILLARY SENTINEL LYMPH NODE BX Right 10/16/2019   Procedure: PARTIAL MASTECTOMY WITH NEEDLE LOCALIZATION AND AXILLARY SENTINEL LYMPH NODE BX;  Surgeon: Benjamine Sprague, DO;  Location: [facility] ORS;  Service: General;  Laterality: Right;   REVERSE SHOULDER ARTHROPLASTY Left 11/14/2022   Procedure: REVERSE SHOULDER ARTHROPLASTY WITH BICEPS TENODESIS.;  Surgeon: Corky Mull, MD;  Location: [facility] ORS;  Service: Orthopedics;  Laterality: Left;   ROTATOR CUFF REPAIR Right 2016   SHOULDER ARTHROSCOPY Right 06/22/2015   Procedure: ARTHROSCOPY  SHOULDER, parital repair of rotator cuff, biceps tenodesis, decompression and debridement;  Surgeon: Corky Mull, MD;  Location: [facility] ORS;  Service: Orthopedics;  Laterality: Right;   [place] PARTIAL N/A 09/29/2015   Procedure: VULVECTOMY PARTIAL;  Surgeon: Gillis Ends, MD;  Location: [facility] ORS;  Service: Gynecology;  Laterality: N/A;    FAMILY HISTORY :   Family History  Problem Relation Age of Onset   Diabetes Mother        died @ 7 of MI.   Hypertension Mother    Heart attack Mother    Melanoma Father 64       died of complications r/t melanoma w/ lung mets.   Kidney disease Sister        Kidney removed    Breast cancer Sister 38   Prostate  cancer Brother        Prostate - dx in 21's   Heart disease Brother 11       reported MI @ age 46, ? treated w/ TPA->no recurrent CAD, now in 20's.   Hearing loss Maternal Aunt    Cancer Maternal Aunt        pancreatic vs colon cancer   Hearing loss Maternal Grandfather    Other Maternal Grandmother        flu pandemic   Kidney cancer Niece 78       partial nephrectomy    SOCIAL HISTORY:   Social History   Tobacco Use   Smoking status: Never   Smokeless tobacco: Never  Vaping Use   Vaping Use: Never used  Substance Use Topics   Alcohol use: No    Alcohol/week: 0.0 standard drinks of alcohol   Drug use: No    ALLERGIES:  is allergic to morphine and related, sulfa antibiotics, iodine, adhesive [tape], erythromycin, and oxycodone.  MEDICATIONS:  Current Outpatient Medications  Medication Sig Dispense Refill   acetaminophen (TYLENOL) 500 MG tablet Take 1,000 mg by mouth daily at 6 (six) AM.     anastrozole (ARIMIDEX) 1 MG tablet TAKE ONE TABLET BY MOUTH DAILY 90 tablet 1   aspirin EC 81 MG tablet Take 81 mg by mouth daily. Swallow whole.     atorvastatin (LIPITOR) 80 MG tablet TAKE 1 TABLET BY MOUTH DAILY (Patient taking differently: Take 80 mg by mouth every morning. TAKE 1 TABLET BY MOUTH DAILY) 90 tablet 3   cholecalciferol (VITAMIN D3) 25 MCG (1000 UT) tablet Take 1,000 Units by mouth daily.     ezetimibe (ZETIA) 10 MG tablet TAKE ONE TABLET BY MOUTH EVERY DAY (Patient taking differently: Take 10 mg by mouth every morning. TAKE ONE TABLET BY MOUTH EVERY DAY) 90 tablet 3   ketoconazole (NIZORAL) 2 % cream Apply twice daily as directed. (Patient taking differently: 1 Application daily as needed for irritation. Apply twice daily as directed.) 60 g 2   lansoprazole (PREVACID) 30 MG capsule TAKE 1 CAPSULE BY MOUTH EVERY DAY (Patient taking differently: Take 30 mg by mouth every morning. TAKE 1 CAPSULE BY MOUTH EVERY DAY) 90 capsule 3   levothyroxine (SYNTHROID) 88 MCG tablet TAKE  1 TABLET EVERY DAY ON EMPTY STOMACHWITH A GLASS OF WATER AT LEAST 30-60 MINBEFORE BREAKFAST 90 tablet 3   lisinopril (ZESTRIL) 40 MG tablet Take 1 tablet (40 mg total) by mouth daily. (Patient taking differently: Take 40 mg by mouth every morning.) 90 tablet 3   Multiple Vitamin (MULTIVITAMIN) tablet Take 1 tablet  by mouth daily.     naproxen (NAPROSYN) 500 MG tablet TAKE ONE TABLET BY MOUTH TWICE DAILY AS NEEDED FOR MODERATE PAIN.  TAKE WITH FOOD 30 tablet 0   Specialty Vitamins Products (ICAPS LUTEIN & ZEAXANTHIN PO) Take 1 tablet by mouth every morning. Zeaxanthin 55m/Lutein 149m    tacrolimus (PROTOPIC) 0.1 % ointment Apply 1-2 times daily as needed for itch to affected areas. (Patient taking differently: 1 Application as needed. Apply 1-2 times daily as needed for itch to affected areas.) 60 g 2   mometasone (ELOCON) 0.1 % cream Apply to itchy spots on body 1-2 times until improved. Avoid face, groin, underarms. (Patient taking differently: Apply 1 Application topically as needed. Apply to itchy spots on body 1-2 times until improved. Avoid face, groin, underarms.) 50 g 3   No current facility-administered medications for this visit.   PHYSICAL EXAMINATION: ECOG PERFORMANCE STATUS: 0 - Asymptomatic  BP (!) 157/76 (Patient Position: Sitting)   Pulse 75   Temp 99.5 F (37.5 C) (Tympanic)   Resp 18   Wt 238 lb (108 kg)   SpO2 97%   BMI 42.16 kg/m   Filed Weights   01/25/23 1030  Weight: 238 lb (108 kg)    Physical Activity: Unknown (03/10/2021)   Exercise Vital Sign    Days of Exercise per Week: 0 days    Minutes of Exercise per Session: Not on file   Physical Exam HENT:     Head: Normocephalic and atraumatic.     Mouth/Throat:     Pharynx: No oropharyngeal exudate.  Eyes:     Pupils: Pupils are equal, round, and reactive to light.  Cardiovascular:     Rate and Rhythm: Normal rate and regular rhythm.  Pulmonary:     Effort: No respiratory distress.     Breath sounds: No  wheezing.  Abdominal:     General: Bowel sounds are normal. There is no distension.     Palpations: Abdomen is soft. There is no mass.     Tenderness: There is no abdominal tenderness. There is no guarding or rebound.  Musculoskeletal:        General: No tenderness. Normal range of motion.     Cervical back: Normal range of motion and neck supple.  Skin:    General: Skin is warm.  Neurological:     Mental Status: She is alert and oriented to person, place, and time.  Psychiatric:        Mood and Affect: Affect normal.    LABORATORY DATA:  I have reviewed the data as listed    Component Value Date/Time   NA 140 11/15/2022 0144   NA 138 09/03/2013 0809   K 3.7 11/15/2022 0144   K 4.3 11/02/2014 0932   CL 106 11/15/2022 0144   CL 105 09/03/2013 0809   CO2 28 11/15/2022 0144   CO2 28 09/03/2013 0809   GLUCOSE 163 (H) 11/15/2022 0144   GLUCOSE 100 (H) 09/03/2013 0809   BUN 15 11/15/2022 0144   BUN 17 09/03/2013 0809   CREATININE 0.90 11/15/2022 0144   CREATININE 0.96 11/02/2014 0929   CALCIUM 8.9 11/15/2022 0144   CALCIUM 9.1 09/03/2013 0809   PROT 7.3 11/06/2022 1546   PROT 6.6 11/02/2014 0929   ALBUMIN 3.8 11/06/2022 1546   ALBUMIN 3.3 (L) 11/02/2014 0929   AST 27 11/06/2022 1546   AST 16 11/02/2014 0929   ALT 22 11/06/2022 1546   ALT 19 11/02/2014 0929   ALKPHOS  94 11/06/2022 1546   ALKPHOS 87 11/02/2014 0929   BILITOT 0.8 11/06/2022 1546   BILITOT 0.3 11/02/2014 0929   GFRNONAA >60 11/15/2022 0144   GFRNONAA >60 11/02/2014 0929   GFRNONAA >60 07/27/2014 0836   GFRAA >60 07/05/2020 0923   GFRAA >60 11/02/2014 0929   GFRAA >60 07/27/2014 0836    No results found for: "SPEP", "UPEP"  Lab Results  Component Value Date   WBC 10.0 11/15/2022   NEUTROABS 3.5 11/06/2022   HGB 11.9 (L) 11/15/2022   HCT 36.5 11/15/2022   MCV 87.3 11/15/2022   PLT 188 11/15/2022      Chemistry      Component Value Date/Time   NA 140 11/15/2022 0144   NA 138 09/03/2013 0809    K 3.7 11/15/2022 0144   K 4.3 11/02/2014 0932   CL 106 11/15/2022 0144   CL 105 09/03/2013 0809   CO2 28 11/15/2022 0144   CO2 28 09/03/2013 0809   BUN 15 11/15/2022 0144   BUN 17 09/03/2013 0809   CREATININE 0.90 11/15/2022 0144   CREATININE 0.96 11/02/2014 0929      Component Value Date/Time   CALCIUM 8.9 11/15/2022 0144   CALCIUM 9.1 09/03/2013 0809   ALKPHOS 94 11/06/2022 1546   ALKPHOS 87 11/02/2014 0929   AST 27 11/06/2022 1546   AST 16 11/02/2014 0929   ALT 22 11/06/2022 1546   ALT 19 11/02/2014 0929   BILITOT 0.8 11/06/2022 1546   BILITOT 0.3 11/02/2014 0929       RADIOGRAPHIC STUDIES: I have personally reviewed the radiological images as listed and agreed with the findings in the report. No results found.   ASSESSMENT & PLAN:  Carcinoma of upper-outer quadrant of right breast in female, estrogen receptor positive ([place]) # Right breast cancer stage I ER/PRPos; HER-2 negative. Anastrozole [until spring, 2026].; NOV, 2022-bilateral mammogram negative. Awaiting mammogram nov 2023- diagnostic/PCP.   # MSK grade 2-secondary to adjuvant AI; continue Osteo Bi-Flex.stable.   # ? TIA-on monitor [April 2023] on asprin +plavix-  stable.   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 Theora Gianotti.  # Bone density test-February 2023-T-score 0.1-normal limits continue calcium plus vitamin D. Discussed/ and reviewed.   mychart  # DISPOSITION: # Follow up in 6 months- MD; no labs-  Dr.B   No orders of the defined types were placed in this encounter.  All questions were answered. The patient knows to call the clinic with any problems, questions or concerns.      Cammie Sickle, MD 01/25/2023 11:38 AM

## 2023-01-25 NOTE — Progress Notes (Signed)
Pt taking her anastrozole. Has aching in her joints and fatigue from this medication. Takes naproxen as needed for the pain. Appetite is good. Denies any breast pain. No new lumps in breast. Last mammogram was Nov 2022.

## 2023-01-26 ENCOUNTER — Encounter: Payer: Self-pay | Admitting: Family Medicine

## 2023-01-26 ENCOUNTER — Ambulatory Visit: Payer: Medicare Other | Admitting: Family Medicine

## 2023-01-26 VITALS — BP 136/80 | HR 77 | Temp 98.3°F | Ht 63.0 in | Wt 241.6 lb

## 2023-01-26 DIAGNOSIS — I10 Essential (primary) hypertension: Secondary | ICD-10-CM | POA: Diagnosis not present

## 2023-01-26 DIAGNOSIS — R7303 Prediabetes: Secondary | ICD-10-CM

## 2023-01-26 DIAGNOSIS — Z96612 Presence of left artificial shoulder joint: Secondary | ICD-10-CM | POA: Diagnosis not present

## 2023-01-26 DIAGNOSIS — M545 Low back pain, unspecified: Secondary | ICD-10-CM | POA: Diagnosis not present

## 2023-01-26 DIAGNOSIS — M25512 Pain in left shoulder: Secondary | ICD-10-CM | POA: Diagnosis not present

## 2023-01-26 LAB — POCT GLYCOSYLATED HEMOGLOBIN (HGB A1C): Hemoglobin A1C: 5.8 % — AB (ref 4.0–5.6)

## 2023-01-26 NOTE — Progress Notes (Signed)
[name], MD Phone: [phone]  Bailey Brooks is a 72 y.o. female who presents today for f/u.  HYPERTENSION Disease Monitoring Home BP Monitoring not checking Chest pain- no    Dyspnea- no Medications Compliance-  taking lisinopril. Patient reports she was previously on amlodipine though had significant swelling in her legs.  Notes she also may have been on HCTZ in the past though is unsure why that was stopped. Edema- no BMET    Component Value Date/Time   NA 140 11/15/2022 0144   NA 138 09/03/2013 0809   K 3.7 11/15/2022 0144   K 4.3 11/02/2014 0932   CL 106 11/15/2022 0144   CL 105 09/03/2013 0809   CO2 28 11/15/2022 0144   CO2 28 09/03/2013 0809   GLUCOSE 163 (H) 11/15/2022 0144   GLUCOSE 100 (H) 09/03/2013 0809   BUN 15 11/15/2022 0144   BUN 17 09/03/2013 0809   CREATININE 0.90 11/15/2022 0144   CREATININE 0.96 11/02/2014 0929   CALCIUM 8.9 11/15/2022 0144   CALCIUM 9.1 09/03/2013 0809   GFRNONAA >60 11/15/2022 0144   GFRNONAA >60 11/02/2014 0929   GFRNONAA >60 07/27/2014 0836   GFRAA >60 07/05/2020 0923   GFRAA >60 11/02/2014 0929   GFRAA >60 07/27/2014 0836   Prediabetes: Patient notes she is eating what ever she wants.  She not exercising much though she is doing physical therapy 2 times a week.  She is been doing back exercises for her back as well.  Chronic back pain: Patient is seeing physiatry.  They wanted to do shots though were hesitant given her prior reaction to numbing medication.  Social History   Tobacco Use  Smoking Status Never  Smokeless Tobacco Never    Current Outpatient Medications on File Prior to Visit  Medication Sig Dispense Refill   acetaminophen (TYLENOL) 500 MG tablet Take 1,000 mg by mouth daily at 6 (six) AM.     anastrozole (ARIMIDEX) 1 MG tablet TAKE ONE TABLET BY MOUTH DAILY 90 tablet 1   aspirin EC 81 MG tablet Take 81 mg by mouth daily. Swallow whole.     atorvastatin (LIPITOR) 80 MG tablet TAKE 1 TABLET BY  MOUTH DAILY (Patient taking differently: Take 80 mg by mouth every morning. TAKE 1 TABLET BY MOUTH DAILY) 90 tablet 3   cholecalciferol (VITAMIN D3) 25 MCG (1000 UT) tablet Take 1,000 Units by mouth daily.     ezetimibe (ZETIA) 10 MG tablet TAKE ONE TABLET BY MOUTH EVERY DAY (Patient taking differently: Take 10 mg by mouth every morning. TAKE ONE TABLET BY MOUTH EVERY DAY) 90 tablet 3   ketoconazole (NIZORAL) 2 % cream Apply twice daily as directed. (Patient taking differently: 1 Application daily as needed for irritation. Apply twice daily as directed.) 60 g 2   lansoprazole (PREVACID) 30 MG capsule TAKE 1 CAPSULE BY MOUTH EVERY DAY (Patient taking differently: Take 30 mg by mouth every morning. TAKE 1 CAPSULE BY MOUTH EVERY DAY) 90 capsule 3   levothyroxine (SYNTHROID) 88 MCG tablet TAKE 1 TABLET EVERY DAY ON EMPTY STOMACHWITH A GLASS OF WATER AT LEAST 30-60 MINBEFORE BREAKFAST 90 tablet 3   lisinopril (ZESTRIL) 40 MG tablet Take 1 tablet (40 mg total) by mouth daily. (Patient taking differently: Take 40 mg by mouth every morning.) 90 tablet 3   mometasone (ELOCON) 0.1 % cream Apply to itchy spots on body 1-2 times until improved. Avoid face, groin, underarms. (Patient taking differently: Apply 1 Application topically as needed. Apply to  itchy spots on body 1-2 times until improved. Avoid face, groin, underarms.) 50 g 3   Multiple Vitamin (MULTIVITAMIN) tablet Take 1 tablet by mouth daily.     naproxen (NAPROSYN) 500 MG tablet TAKE ONE TABLET BY MOUTH TWICE DAILY AS NEEDED FOR MODERATE PAIN.  TAKE WITH FOOD 30 tablet 0   Specialty Vitamins Products (ICAPS LUTEIN & ZEAXANTHIN PO) Take 1 tablet by mouth every morning. Zeaxanthin '4mg'$ /Lutein '10mg'$      tacrolimus (PROTOPIC) 0.1 % ointment Apply 1-2 times daily as needed for itch to affected areas. (Patient taking differently: 1 Application as needed. Apply 1-2 times daily as needed for itch to affected areas.) 60 g 2   No current facility-administered  medications on file prior to visit.     ROS see history of present illness  Objective  Physical Exam Vitals:   01/26/23 1540 01/26/23 1609  BP: (!) 140/85 136/80  Pulse: 77   Temp: 98.3 F (36.8 C)   SpO2: 98%     BP Readings from Last 3 Encounters:  01/26/23 136/80  01/25/23 (!) 157/76  01/15/23 135/73   Wt Readings from Last 3 Encounters:  01/26/23 241 lb 9.6 oz (109.6 kg)  01/25/23 238 lb (108 kg)  01/15/23 246 lb (111.6 kg)    Physical Exam Constitutional:      General: She is not in acute distress.    Appearance: She is not diaphoretic.  Cardiovascular:     Rate and Rhythm: Normal rate and regular rhythm.     Heart sounds: Normal heart sounds.  Pulmonary:     Effort: Pulmonary effort is normal.     Breath sounds: Normal breath sounds.  Skin:    General: Skin is warm and dry.  Neurological:     Mental Status: She is alert.      Assessment/Plan: Please see individual problem list.  Essential hypertension Assessment & Plan: Above goal.  Patient notes when physical therapy was coming to the house after her shoulder surgery her blood pressure was well-controlled.  We discussed starting additional medication or having her check it at home.  Patient opted to check her blood pressure at home.  I will see her back in a month.  She will continue lisinopril 40 mg daily.   Prediabetes Assessment & Plan: Chronic issue.  Check A1c.  She will remain as active as possible.  Given dietary guidelines that could be helpful.  Orders: -     POCT glycosylated hemoglobin (Hb A1C)  Acute right-sided low back pain without sciatica Assessment & Plan: Chronic issue.  She will see physiatry for this.     Return in about 1 month (around 02/24/2023) for HTN.   [name], MD [place]

## 2023-01-26 NOTE — Assessment & Plan Note (Signed)
Chronic issue.  She will see physiatry for this.

## 2023-01-26 NOTE — Assessment & Plan Note (Signed)
Chronic issue.  Check A1c.  She will remain as active as possible.  Given dietary guidelines that could be helpful.

## 2023-01-26 NOTE — Assessment & Plan Note (Signed)
Above goal.  Patient notes when physical therapy was coming to the house after her shoulder surgery her blood pressure was well-controlled.  We discussed starting additional medication or having her check it at home.  Patient opted to check her blood pressure at home.  I will see her back in a month.  She will continue lisinopril 40 mg daily.

## 2023-01-26 NOTE — Patient Instructions (Signed)
Nice to see you. Please start checking your blood pressure at home.  Your goal blood pressure is less than 130/80. Please try to work on some dietary changes to help with your blood pressure and prediabetes.  Diet Recommendations  Starchy (carb) foods: Bread, rice, pasta, potatoes, corn, cereal, grits, crackers, bagels, muffins, all baked goods.  (Fruits, milk, and yogurt also have carbohydrate, but most of these foods will not spike your blood sugar as the starchy foods will.)  A few fruits do cause high blood sugars; use small portions of bananas (limit to 1/2 at a time), grapes, watermelon, oranges, and most tropical fruits.    Protein foods: Meat, fish, poultry, eggs, dairy foods, and beans such as pinto and kidney beans (beans also provide carbohydrate).   1. Eat at least 3 meals and 1-2 snacks per day. Never go more than 4-5 hours while awake without eating. Eat breakfast within the first hour of getting up.   2. Limit starchy foods to TWO per meal and ONE per snack. ONE portion of a starchy  food is equal to the following:   - ONE slice of bread (or its equivalent, such as half of a hamburger bun).   - 1/2 cup of a "scoopable" starchy food such as potatoes or rice.   - 15 grams of carbohydrate as shown on food label.  3. Include at every meal: a protein food, a carb food, and vegetables and/or fruit.   - Obtain twice the volume of veg's as protein or carbohydrate foods for both lunch and dinner.   - Fresh or frozen veg's are best.   - Keep frozen veg's on hand for a quick vegetable serving.

## 2023-01-29 DIAGNOSIS — Z96612 Presence of left artificial shoulder joint: Secondary | ICD-10-CM | POA: Diagnosis not present

## 2023-02-01 ENCOUNTER — Encounter: Payer: Self-pay | Admitting: Family Medicine

## 2023-02-01 DIAGNOSIS — M25512 Pain in left shoulder: Secondary | ICD-10-CM | POA: Diagnosis not present

## 2023-02-01 DIAGNOSIS — Z96612 Presence of left artificial shoulder joint: Secondary | ICD-10-CM | POA: Diagnosis not present

## 2023-02-02 NOTE — Telephone Encounter (Signed)
Nurse visit or pcp f/u

## 2023-02-06 ENCOUNTER — Ambulatory Visit: Payer: Medicare Other

## 2023-02-06 DIAGNOSIS — Z96612 Presence of left artificial shoulder joint: Secondary | ICD-10-CM | POA: Diagnosis not present

## 2023-02-06 DIAGNOSIS — M25512 Pain in left shoulder: Secondary | ICD-10-CM | POA: Diagnosis not present

## 2023-02-06 DIAGNOSIS — I10 Essential (primary) hypertension: Secondary | ICD-10-CM

## 2023-02-06 NOTE — Progress Notes (Signed)
Patient came into office for a BP recheck and pt wanted to check her at home BP machine for accuracy. Pt has wrist cuff bp machine.Pt only wanted to take BP at her wrist and not in her arm. Pt stated that her left arm hurts and she doesn't want to take in right arm . BP taken  manually  was 156/78 ,O2 was 97 And Hr was 61. BP taken with pt wrist cuff was 148/80 and O2 was 96 Hr was 71. I had pt to relax for about 10 mins and I took it again manually in right wrist and BP was 150/78 O2 was 96 Hr was 70. Pt cuff  readings were 148/82 Os was 96 Hr was 70. I explained to pt that cuff may not be accurate and that I would put it in the notes as well.

## 2023-02-07 ENCOUNTER — Telehealth: Payer: Self-pay | Admitting: Internal Medicine

## 2023-02-07 DIAGNOSIS — I10 Essential (primary) hypertension: Secondary | ICD-10-CM

## 2023-02-07 MED ORDER — HYDROCHLOROTHIAZIDE 12.5 MG PO TABS: 12.5000 mg | ORAL_TABLET | Freq: Every day | ORAL | 1 refills | Status: DC

## 2023-02-07 NOTE — Telephone Encounter (Signed)
See below - I copied the nurse visit note to you.  Ms Hammell came in for a blood pressure check.  Blood pressure remaining elevated.   "Patient came into office for a BP recheck and pt wanted to check her at home BP machine for accuracy. Pt has wrist cuff bp machine.Pt only wanted to take BP at her wrist and not in her arm. Pt stated that her left arm hurts and she doesn't want to take in right arm . BP taken manually was 156/78 ,O2 was 97 And Hr was 61. BP taken with pt wrist cuff was 148/80 and O2 was 96 Hr was 71. I had pt to relax for about 10 mins and I took it again manually in right wrist and BP was 150/78 O2 was 96 Hr was 70. Pt cuff readings were 148/82 Os was 96 Hr was 70. I explained to pt that cuff may not be accurate and that I would put it in the notes as well. " I did not add anything.  Forwarding to you to review.  Let me know if you need me to do anything.   Arlo Buffone

## 2023-02-07 NOTE — Telephone Encounter (Signed)
Left message to call the office back.

## 2023-02-07 NOTE — Telephone Encounter (Signed)
Medication sent to pharmacy per provider.  Barlow Harrison,cma

## 2023-02-07 NOTE — Addendum Note (Signed)
Addended by: Fulton Mole D on: 02/07/2023 10:14 AM   Modules accepted: Orders

## 2023-02-07 NOTE — Telephone Encounter (Signed)
BP is above goal. I would like to add on HCTZ 12.5 mg daily and have the patient follow-up with me for her BP and lab work in 1 month.

## 2023-02-07 NOTE — Telephone Encounter (Addendum)
Patient called back and is fine with trying the HCTZ and asked for it to be sent in to [facility]. I asked about scheduling a follow up in a month but she said she already has one scheduled for 02/21/23

## 2023-02-08 DIAGNOSIS — Z96612 Presence of left artificial shoulder joint: Secondary | ICD-10-CM | POA: Diagnosis not present

## 2023-02-08 DIAGNOSIS — M25512 Pain in left shoulder: Secondary | ICD-10-CM | POA: Diagnosis not present

## 2023-02-09 ENCOUNTER — Ambulatory Visit
Admission: RE | Admit: 2023-02-09 | Discharge: 2023-02-09 | Disposition: A | Discharge status: Home / Self Care | Payer: Medicare Other | Source: Ambulatory Visit | Attending: Family Medicine | Admitting: Family Medicine

## 2023-02-09 DIAGNOSIS — C50411 Malignant neoplasm of upper-outer quadrant of right female breast: Secondary | ICD-10-CM | POA: Diagnosis not present

## 2023-02-09 DIAGNOSIS — Z170 Estrogen receptor positive status [ER+]: Secondary | ICD-10-CM | POA: Diagnosis not present

## 2023-02-09 DIAGNOSIS — Z853 Personal history of malignant neoplasm of breast: Secondary | ICD-10-CM | POA: Diagnosis not present

## 2023-02-12 DIAGNOSIS — Z96612 Presence of left artificial shoulder joint: Secondary | ICD-10-CM | POA: Diagnosis not present

## 2023-02-15 DIAGNOSIS — Z96612 Presence of left artificial shoulder joint: Secondary | ICD-10-CM | POA: Diagnosis not present

## 2023-02-19 ENCOUNTER — Other Ambulatory Visit: Payer: Self-pay | Admitting: Family Medicine

## 2023-02-20 DIAGNOSIS — Z96612 Presence of left artificial shoulder joint: Secondary | ICD-10-CM | POA: Diagnosis not present

## 2023-02-21 ENCOUNTER — Encounter: Payer: Self-pay | Admitting: Family Medicine

## 2023-02-21 ENCOUNTER — Ambulatory Visit (INDEPENDENT_AMBULATORY_CARE_PROVIDER_SITE_OTHER): Payer: Medicare Other | Admitting: Family Medicine

## 2023-02-21 VITALS — BP 126/78 | HR 73 | Temp 98.1°F | Ht 63.0 in | Wt 236.8 lb

## 2023-02-21 DIAGNOSIS — E785 Hyperlipidemia, unspecified: Secondary | ICD-10-CM

## 2023-02-21 DIAGNOSIS — I10 Essential (primary) hypertension: Secondary | ICD-10-CM | POA: Diagnosis not present

## 2023-02-21 LAB — BASIC METABOLIC PANEL
BUN: 28 mg/dL — ABNORMAL HIGH (ref 6–23)
CO2: 31 mEq/L (ref 19–32)
Calcium: 9.5 mg/dL (ref 8.4–10.5)
Chloride: 101 mEq/L (ref 96–112)
Creatinine, Ser: 0.89 mg/dL (ref 0.40–1.20)
GFR: 64.48 mL/min (ref >60.00)
Glucose, Bld: 123 mg/dL — ABNORMAL HIGH (ref 70–99)
Potassium: 4.2 mEq/L (ref 3.5–5.1)
Sodium: 140 mEq/L (ref 135–145)

## 2023-02-21 MED ORDER — NAPROXEN 500 MG PO TABS: ORAL_TABLET | ORAL | 1 refills | Status: DC

## 2023-02-21 MED ORDER — HYDROCHLOROTHIAZIDE 12.5 MG PO TABS: 12.5000 mg | ORAL_TABLET | Freq: Every day | ORAL | 3 refills | Status: DC

## 2023-02-21 NOTE — Patient Instructions (Signed)
Nice to see you. Please continue to monitor your blood pressure. We will contact you with your lab results. 

## 2023-02-21 NOTE — Progress Notes (Signed)
[name], MD Phone: [phone]  Bailey Brooks is a 72 y.o. female who presents today for f/u.  HYPERTENSION Disease Monitoring Home BP Monitoring <130/80 Chest pain- no    Dyspnea- no Medications Compliance-  taking lisinopril, HCTZ.  Edema- no BMET    Component Value Date/Time   NA 140 11/15/2022 0144   NA 138 09/03/2013 0809   K 3.7 11/15/2022 0144   K 4.3 11/02/2014 0932   CL 106 11/15/2022 0144   CL 105 09/03/2013 0809   CO2 28 11/15/2022 0144   CO2 28 09/03/2013 0809   GLUCOSE 163 (H) 11/15/2022 0144   GLUCOSE 100 (H) 09/03/2013 0809   BUN 15 11/15/2022 0144   BUN 17 09/03/2013 0809   CREATININE 0.90 11/15/2022 0144   CREATININE 0.96 11/02/2014 0929   CALCIUM 8.9 11/15/2022 0144   CALCIUM 9.1 09/03/2013 0809   GFRNONAA >60 11/15/2022 0144   GFRNONAA >60 11/02/2014 0929   GFRNONAA >60 07/27/2014 0836   GFRAA >60 07/05/2020 0923   GFRAA >60 11/02/2014 0929   GFRAA >60 07/27/2014 0836   HYPERLIPIDEMIA Symptoms Chest pain on exertion:  no  Medications: Compliance- taking lipitor Right upper quadrant pain- no  Muscle aches- no Lipid Panel     Component Value Date/Time   CHOL 101 04/03/2022 0542   TRIG 119 04/03/2022 0542   HDL 37 (L) 04/03/2022 0542   CHOLHDL 2.7 04/03/2022 0542   VLDL 24 04/03/2022 0542   LDLCALC 40 04/03/2022 0542   LDLDIRECT 54.4 04/02/2022 1301     Social History   Tobacco Use  Smoking Status Never  Smokeless Tobacco Never    Current Outpatient Medications on File Prior to Visit  Medication Sig Dispense Refill   acetaminophen (TYLENOL) 500 MG tablet Take 1,000 mg by mouth daily at 6 (six) AM.     anastrozole (ARIMIDEX) 1 MG tablet TAKE ONE TABLET BY MOUTH DAILY 90 tablet 1   aspirin EC 81 MG tablet Take 81 mg by mouth daily. Swallow whole.     atorvastatin (LIPITOR) 80 MG tablet TAKE 1 TABLET BY MOUTH DAILY (Patient taking differently: Take 80 mg by mouth every morning. TAKE 1 TABLET BY MOUTH DAILY) 90 tablet 3    cholecalciferol (VITAMIN D3) 25 MCG (1000 UT) tablet Take 1,000 Units by mouth daily.     ezetimibe (ZETIA) 10 MG tablet TAKE ONE TABLET BY MOUTH EVERY DAY (Patient taking differently: Take 10 mg by mouth every morning. TAKE ONE TABLET BY MOUTH EVERY DAY) 90 tablet 3   ketoconazole (NIZORAL) 2 % cream Apply twice daily as directed. (Patient taking differently: 1 Application daily as needed for irritation. Apply twice daily as directed.) 60 g 2   lansoprazole (PREVACID) 30 MG capsule TAKE 1 CAPSULE BY MOUTH EVERY DAY (Patient taking differently: Take 30 mg by mouth every morning. TAKE 1 CAPSULE BY MOUTH EVERY DAY) 90 capsule 3   levothyroxine (SYNTHROID) 88 MCG tablet TAKE 1 TABLET EVERY DAY ON EMPTY STOMACHWITH A GLASS OF WATER AT LEAST 30-60 MINBEFORE BREAKFAST 90 tablet 3   lisinopril (ZESTRIL) 40 MG tablet Take 1 tablet (40 mg total) by mouth daily. (Patient taking differently: Take 40 mg by mouth every morning.) 90 tablet 3   mometasone (ELOCON) 0.1 % cream Apply to itchy spots on body 1-2 times until improved. Avoid face, groin, underarms. (Patient taking differently: Apply 1 Application topically as needed. Apply to itchy spots on body 1-2 times until improved. Avoid face, groin, underarms.) 50 g 3  Multiple Vitamin (MULTIVITAMIN) tablet Take 1 tablet by mouth daily.     Specialty Vitamins Products (ICAPS LUTEIN & ZEAXANTHIN PO) Take 1 tablet by mouth every morning. Zeaxanthin 4mg /Lutein 10mg      tacrolimus (PROTOPIC) 0.1 % ointment Apply 1-2 times daily as needed for itch to affected areas. (Patient taking differently: 1 Application as needed. Apply 1-2 times daily as needed for itch to affected areas.) 60 g 2   No current facility-administered medications on file prior to visit.     ROS see history of present illness  Objective  Physical Exam Vitals:   02/21/23 0954 02/21/23 1003  BP: 132/84 126/78  Pulse: 73   Temp: 98.1 F (36.7 C)   SpO2: 97%     BP Readings from Last 3  Encounters:  02/21/23 126/78  01/26/23 136/80  01/25/23 (!) 157/76   Wt Readings from Last 3 Encounters:  02/21/23 236 lb 12.8 oz (107.4 kg)  01/26/23 241 lb 9.6 oz (109.6 kg)  01/25/23 238 lb (108 kg)    Physical Exam Constitutional:      General: She is not in acute distress.    Appearance: She is not diaphoretic.  Cardiovascular:     Rate and Rhythm: Normal rate and regular rhythm.     Heart sounds: Normal heart sounds.  Pulmonary:     Effort: Pulmonary effort is normal.     Breath sounds: Normal breath sounds.  Skin:    General: Skin is warm and dry.  Neurological:     Mental Status: She is alert.      Assessment/Plan: Please see individual problem list.  Essential hypertension Assessment & Plan: Chronic issue.  Adequately controlled at home.  She will continue lisinopril 40 mg daily and hydrochlorothiazide 12.5 mg daily.  Check BMP today.  Orders: -     hydroCHLOROthiazide; Take 1 tablet (12.5 mg total) by mouth daily.  Dispense: 90 tablet; Refill: 3 -     Basic metabolic panel  Hyperlipidemia, unspecified hyperlipidemia type Assessment & Plan: Chronic issue.  Most recent lipid panel well-controlled.  She will continue Lipitor 80 mg daily and Zetia 10 mg daily.   Other orders -     Naproxen; TAKE ONE TABLET BY MOUTH TWICE DAILY AS NEEDED FOR MODERATE PAIN.  TAKE WITH FOOD  Dispense: 90 tablet; Refill: 1     Health Maintenance: Given the patient's age she was encouraged to get the updated COVID-vaccine for a second dose.  Return in about 6 months (around 08/24/2023).   [name], MD [place]

## 2023-02-21 NOTE — Assessment & Plan Note (Signed)
Chronic issue.  Adequately controlled at home.  She will continue lisinopril 40 mg daily and hydrochlorothiazide 12.5 mg daily.  Check BMP today.

## 2023-02-21 NOTE — Assessment & Plan Note (Signed)
Chronic issue.  Most recent lipid panel well-controlled.  She will continue Lipitor 80 mg daily and Zetia 10 mg daily.

## 2023-02-23 DIAGNOSIS — Z96612 Presence of left artificial shoulder joint: Secondary | ICD-10-CM | POA: Diagnosis not present

## 2023-03-01 DIAGNOSIS — Z96612 Presence of left artificial shoulder joint: Secondary | ICD-10-CM | POA: Diagnosis not present

## 2023-03-01 DIAGNOSIS — M25512 Pain in left shoulder: Secondary | ICD-10-CM | POA: Diagnosis not present

## 2023-03-06 DIAGNOSIS — M25512 Pain in left shoulder: Secondary | ICD-10-CM | POA: Diagnosis not present

## 2023-03-06 DIAGNOSIS — Z96612 Presence of left artificial shoulder joint: Secondary | ICD-10-CM | POA: Diagnosis not present

## 2023-03-08 ENCOUNTER — Other Ambulatory Visit: Payer: Self-pay | Admitting: Dermatology

## 2023-03-08 DIAGNOSIS — Z96612 Presence of left artificial shoulder joint: Secondary | ICD-10-CM | POA: Diagnosis not present

## 2023-03-08 DIAGNOSIS — L309 Dermatitis, unspecified: Secondary | ICD-10-CM

## 2023-03-15 DIAGNOSIS — Z96612 Presence of left artificial shoulder joint: Secondary | ICD-10-CM | POA: Diagnosis not present

## 2023-03-20 DIAGNOSIS — Z96612 Presence of left artificial shoulder joint: Secondary | ICD-10-CM | POA: Diagnosis not present

## 2023-03-22 DIAGNOSIS — Z96612 Presence of left artificial shoulder joint: Secondary | ICD-10-CM | POA: Diagnosis not present

## 2023-03-22 DIAGNOSIS — M25612 Stiffness of left shoulder, not elsewhere classified: Secondary | ICD-10-CM | POA: Diagnosis not present

## 2023-03-28 DIAGNOSIS — Z96612 Presence of left artificial shoulder joint: Secondary | ICD-10-CM | POA: Diagnosis not present

## 2023-03-28 DIAGNOSIS — M25612 Stiffness of left shoulder, not elsewhere classified: Secondary | ICD-10-CM | POA: Diagnosis not present

## 2023-03-30 DIAGNOSIS — Z96612 Presence of left artificial shoulder joint: Secondary | ICD-10-CM | POA: Diagnosis not present

## 2023-03-30 DIAGNOSIS — M25612 Stiffness of left shoulder, not elsewhere classified: Secondary | ICD-10-CM | POA: Diagnosis not present

## 2023-04-03 DIAGNOSIS — Z96612 Presence of left artificial shoulder joint: Secondary | ICD-10-CM | POA: Diagnosis not present

## 2023-04-05 DIAGNOSIS — Z96612 Presence of left artificial shoulder joint: Secondary | ICD-10-CM | POA: Diagnosis not present

## 2023-04-11 ENCOUNTER — Inpatient Hospital Stay: Payer: Medicare Other | Attending: Obstetrics and Gynecology | Admitting: Nurse Practitioner

## 2023-04-11 VITALS — BP 102/31 | HR 78 | Temp 96.7°F | Resp 20 | Wt 239.8 lb

## 2023-04-11 DIAGNOSIS — Z79811 Long term (current) use of aromatase inhibitors: Secondary | ICD-10-CM | POA: Insufficient documentation

## 2023-04-11 DIAGNOSIS — C50919 Malignant neoplasm of unspecified site of unspecified female breast: Secondary | ICD-10-CM | POA: Insufficient documentation

## 2023-04-11 DIAGNOSIS — Z8544 Personal history of malignant neoplasm of other female genital organs: Secondary | ICD-10-CM | POA: Insufficient documentation

## 2023-04-11 DIAGNOSIS — Z96612 Presence of left artificial shoulder joint: Secondary | ICD-10-CM | POA: Diagnosis not present

## 2023-04-11 DIAGNOSIS — C4499 Other specified malignant neoplasm of skin, unspecified: Secondary | ICD-10-CM

## 2023-04-11 NOTE — Progress Notes (Signed)
Gynecologic Oncology Interval Visit   Referring Provider: Dr. Tiburcio Pea  Chief Concern: Recurrent paget's disease of the vulva  Subjective:  Bailey Brooks is a 72 y.o. female, diagnosed with Paget's of the vulva, s/p WLE and vulvar biopsy 09/29/2015, who returns to clinic to re-evaluation.   She had recurrent pagets in 2021 and elected for imiquimod. She completed imiquimod treatment on 08/14/20. Biopsies post imiquimod were negative. We discussed maintenance imiquimod vs monitoring as well as EUA, mapping, resection. She preferred to avoid invasive procedures when possible and elected for surveillance.   She continues surveillance for history of breast cancer, currently on anastrozole and followed by Dr. Donneta Romberg. She has aches and pains with medication but overall stable. She has chronic back pain which is unchanged. She underwent left shoulder surgery in December 2023.   She denies vulvar irritation or other gynecologic issues today.     Gynecologic Oncology History: Bailey Brooks has a history of localized vulvar Paget's disease.   04/2013- vulvar biopsy revealed Paget's disease               WLE, additional margins resected for positive disease on frozen evaluation.   Part A: VULVA, VAGINAL MARGIN:  - POSITIVE FOR EXTRAMAMMARY PAGET'S DISEASE  Part B: VULVA, POSTERIOR MARGIN:  - POSITIVE FOR EXTRAMAMMARY PAGET'S DISEASE  Part C: VULVA, RIGHT, PARTIAL VULVECTOMY:  - EXTRAMAMMARY PAGET'S DISEASE  Part D: VULVA, NEW VAGINAL MARGIN:  - POSITIVE FOR EXTRAMAMMARY PAGET'S DISEASE  07/28/2015 for routine surveillance an area on the right vulva seemed more suspicious for recurrence. This was biopsied and confirmed recurrent Paget's disease.   She underwent repeat excision 09/29/15 A. VULVA; EXCISION:  - RARE CYTOKERATIN 7 POSITIVE INTRAEPIDERMAL CELLS, INTERPRETED AS RESIDUAL PAGET DISEASE.  - THE SURGICAL MARGINS ARE CLEAR.   B. VULVA, 11:00; BIOPSY:  - RARE CYTOKERATIN 7  POSITIVE INTRAEPIDERMAL CELLS, INTERPRETED AS PAGET DISEASE.   No additional issues since surgery.   She had vulvar biopsy on 05/09/17 which was negative:   DIAGNOSIS:  A. VULVA; BIOPSY:  - SKIN WITH FOCAL KERATOSIS AND HYPERGRANULOSIS - NEGATIVE FOR DYSPLASIA AND MALIGNANCY  Of note, she has had a h/o recurrent vulvar candidiasis. Prior to her vulvar surgery 09/2015 she received 5 weekly doses of oral diflucan for vulvar candidiasis. Postop she was treated for yeast infection again in 12/17 and then took one Diflucan a week for six months with good results.   Seen in gyn-onc clinic on 01/16/18 by Dr. Sonia Side. NED at that time.   She has swelling of her lower extremities which was evaluated by her PCP and thought to be related to amlodipine. She started lasix which has improved her symptoms. She was seen by Dr. 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   She saw Dr. Sonia Side on 01/29/2019 with symptoms concerning for candidiasis at that time but no evidence of recurrent disease.   Her blood pressure medications were recently changes due to swelling secondary to amlodipine. No on lisinopril.   Previous biopsies were consistent with recurrent Paget's.   DIAGNOSIS:  A. VULVA, LEFT AT 3:00; PUNCH BIOPSY:  - BENIGN VULVAR TISSUE.  - NEGATIVE FOR DYSPLASIA AND MALIGNANCY.   DIAGNOSIS:  A. VULVA, RIGHT AT 7:00; PUNCH BIOPSY:  - EXTRAMAMMARY PAGET'S DISEASE OF THE VULVA.  - NEGATIVE FOR DYSPLASIA AND MALIGNANCY.   In the interim she has completed surgery and radiation for stage I right breast cancer, ER/PR positive HER-2/neu  negative. She completed radiation on 12/29/2019. Anastrozole was recommended and she plans to start in approximately late February.   At last visit we had recommended start Aldara but due to recent diagnosis of breast cancer at that time, patient requested to wait to start treatment and returns today to discuss options  for management and consider starting Aldara. She is concerned about reaching site to be treated and tolerability. Her daughter-in-law is going to help her with application. Previously she had expressed desire to avoid surgery/Mohs.   01/16/20 started Imiquimod  completed 5 weeks of treatment then held 02/18/2020 due to ulceration, erythema, and pain. Restarted on 03/17/2020. We recommended to continue imiquimod for 16 weeks and add clobetasol, 0.05% cream to vulva on alternating days that she does not use imiquimod to help with inflammation and reduce risk of recurrent ulcer/skin breakdown. Completed treatment on 08/14/2020.   08/14/20 She completed imiquimod treatment. At last appointment, post imiquimod, she underwent biopsy x 2 which were negative for Paget's. We discussed maintenance imiquimod vs monitoring. She elected close surveillance.   08/25/20 DIAGNOSIS:  A. VULVA, RIGHT; BIOPSY:  - BENIGN SQUAMOUS MUCOSA WITH MILD CHRONIC INFLAMMATION.  - NEGATIVE FOR DYSPLASIA AND MALIGNANCY.  - NEGATIVE FOR ACTIVE INFLAMMATION AND FUNGAL ELEMENTS.   B. VULVA, LEFT; BIOPSY:  - BENIGN SQUAMOUS MUCOSA WITH MILD CHRONIC INFLAMMATION.  - NEGATIVE FOR DYSPLASIA AND MALIGNANCY.  - NEGATIVE FOR ACTIVE INFLAMMATION AND FUNGAL ELEMENTS.   Comment:  The history of extramammary Paget's disease is noted. Immunohistochemical stain for CK7 (blocks A1 and B1) is negative.   Patient had vasovagal episode secondary to straining to have BM in September 2021 and was seen in ER. Concerned lidocaine for biopsy could have contributed. She was seen in ER and had negative head ct. Left frontal ventriculoperitoneal shunt was stable on imaging.    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 04/24/2022   Lumbar spondylosis  04/24/2022   TIA (transient ischemic attack) 04/02/2022   Yeast vaginitis 02/06/2022   Rotator cuff tear 12/09/2021   Syncope 09/03/2020   Low serum vitamin B12 05/28/2020   Fatigue 03/24/2020   Genetic testing 03/23/2020   Family history of breast cancer    Family history of prostate cancer    Family history of melanoma    Carcinoma of upper-outer quadrant of right breast in female, estrogen receptor positive (HCC) 10/28/2019   Pain due to onychomycosis of toenails of both feet 06/09/2019   Prediabetes 12/20/2018   Macular degeneration 06/20/2018   GERD (gastroesophageal reflux disease) 06/20/2018   Hypersomnia 06/20/2018   Bruising 12/14/2017   History of lymphoma 06/11/2017   Vulvar lesion 05/16/2017   Low back pain 04/03/2017   History of transient ischemic attack (TIA) 02/10/2017   Hypokalemia 02/10/2017   Left hip pain 08/03/2016   Essential hypertension 07/06/2016   Hypomagnesemia 03/02/2016   Morbid obesity due to excess calories (HCC) 03/02/2016   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   Adjustment disorder with mixed anxiety and depressed mood 05/25/2015   Paget disease, 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   Eczema 02/27/2013   Muscle cramps 11/13/2012   Hypothyroidism 02/20/2012   HLD (hyperlipidemia) 02/20/2012    Past Medical History: Past Medical History:  Diagnosis Date   Adult pulmonary Langerhans cell histiocytosis (HCC)    Eosinophilic Granuloma of the Lung)  Allergic rhinitis    Antineoplastic chemotherapy induced anemia    Arthritis    Breast cancer, right (HCC) 09/17/2019   a.) Bx (+) stage 1 IMC (G1, ER/PR +, Her2/neu -); pT1a N0 M0; Tx'd with adjuvant XRT + endocrine therapy   Complication of anesthesia    pt was awake during intubation for colonoscopy   Diastolic dysfunction    a.) TTE 11/14/2016: EF 60-65%, no rwma, mild LVH, triv MR, mildly dil LA, nl RVSF, G1DD; b. 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   Tubular adenoma of colon     Past Surgical History: Past Surgical History:  Procedure Laterality Date   ABDOMINAL HYSTERECTOMY  1985   Hysterectomy-partial   BREAST BIOPSY Right 2002   Neg - AT [facility]   BREAST BIOPSY Right 2020   bx done at [facility]?, IDC and DCIS   BREAST LUMPECTOMY Right 10/16/2019   IDC and DCIS, negative LN   BURR HOLE W/ PLACEMENT OMMAYA RESERVOIR     BURR HOLE W/ PLACEMENT OMMAYA RESERVOIR  2012   COLONOSCOPY  04/2013   ESOPHAGOGASTRODUODENOSCOPY  04/2013   INJECTION KNEE  07/02/2018   INSERTION CENTRAL VENOUS ACCESS DEVICE W/ SUBCUTANEOUS PORT  2011   Port a Cath: Right chest Double Lumen, 04-Nov-2010   LIMBAL STEM CELL TRANSPLANT  2012   LIVER BIOPSY     stage 4B large Bcell lymphoma   PARTIAL MASTECTOMY  WITH NEEDLE LOCALIZATION AND AXILLARY SENTINEL LYMPH NODE BX Right 10/16/2019   Procedure: PARTIAL MASTECTOMY WITH NEEDLE LOCALIZATION AND AXILLARY SENTINEL LYMPH NODE BX;  Surgeon: Sung Amabile, DO;  Location: [facility] ORS;  Service: General;  Laterality: Right;   REVERSE SHOULDER ARTHROPLASTY Left 11/14/2022   Procedure: REVERSE SHOULDER ARTHROPLASTY WITH BICEPS TENODESIS.;  Surgeon: Christena Flake, MD;  Location: [facility];  Service: Orthopedics;  Laterality: Left;   ROTATOR CUFF REPAIR Right 2016   SHOULDER ARTHROSCOPY Right 06/22/2015   Procedure: ARTHROSCOPY SHOULDER, parital repair of rotator cuff, biceps tenodesis, decompression and debridement;  Surgeon: Christena Flake, MD;  Location: [facility] OR[facility];  Service: Orthopedics;  Laterality: Right;   TONSILLECTOMY  1954  VULVECTOMY     VULVECTOMY PARTIAL N/A 09/29/2015   Procedure: VULVECTOMY PARTIAL;  Surgeon: Artelia Laroche, MD;  Location: [facility] OR[facility];  Service: Gynecology;  Laterality: N/A;    Past Gynecologic History:  S/p hysterectomy  OB History:  OB History  No obstetric history on file.    Family History: Family History  Problem Relation Age of Onset   Diabetes Mother        died @ 79 of MI.   Hypertension Mother    Heart attack Mother    Melanoma Father 53       died of complications r/t melanoma w/ lung mets.   Kidney disease Sister        Kidney removed    Breast cancer Sister 54   Prostate cancer Brother        Prostate - dx in 31's   Heart disease Brother 65       reported MI @ age 61, ? treated w/ TPA->no recurrent CAD, now in 55's.   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  No pets.  Retired from [employer].  Activity limited by chronic back pain-sedentary.   Right-handed   Caffeine: occasional caffeine free/diet soda or hot tea   Social Determinants of Brooks   Financial Resource Strain: Low Risk  (03/13/2022)   Overall Financial Resource Strain (CARDIA)    Difficulty of Paying Living Expenses: Not hard at all  Food Insecurity: No Food Insecurity (11/14/2022)   Hunger Vital Sign    Worried About Running Out of Food in the Last Year: Never true    Ran Out of Food in the Last Year: Never true  Transportation Needs: No Transportation Needs (11/14/2022)   PRAPARE - Administrator, Civil Service (Medical): No    Lack of Transportation (Non-Medical): No  Physical Activity: Unknown (03/10/2021)   Exercise Vital Sign    Days of Exercise per Week: 0 days    Minutes of Exercise per Session: Not on file  Stress: No Stress Concern Present (03/13/2022)   Bailey Brooks - Occupational Stress Questionnaire    Feeling of Stress : Only a little  Social Connections: Unknown (03/13/2022)   Social Connection and Isolation Panel [NHANES]    Frequency of Communication with Friends and Family: Not on file  Frequency of Social Gatherings with Friends and Family: Not on file    Attends Religious Services: Not on file    Active Member of Clubs or  Organizations: Not on file    Attends Banker Meetings: Not on file    Marital Status: Married  Intimate Partner Violence: Not At Risk (11/14/2022)   Humiliation, Afraid, Rape, and Kick questionnaire    Fear of Current or Ex-Partner: No    Emotionally Abused: No    Physically Abused: No    Sexually Abused: No    Allergies: Allergies  Allergen Reactions   Morphine And Related Nausea And Vomiting and Nausea Only    Hallucinations   Sulfa Antibiotics Other (See Comments)    Dizzy/Fainting   Iodine Swelling    IV iodine (states now that this can be tolerated with Benadryl)   Adhesive [Tape] Rash and Other (See Comments)    Including Bandaids-paper tape ok to use   Erythromycin Nausea And Vomiting   Oxycodone Nausea And Vomiting    Current Medications: Current Outpatient Medications  Medication Sig Dispense Refill   acetaminophen (TYLENOL) 500 MG tablet Take 1,000 mg by mouth daily at 6 (six) AM.     anastrozole (ARIMIDEX) 1 MG tablet TAKE ONE TABLET BY MOUTH DAILY 90 tablet 1   aspirin EC 81 MG tablet Take 81 mg by mouth daily. Swallow whole.     atorvastatin (LIPITOR) 80 MG tablet TAKE 1 TABLET BY MOUTH DAILY (Patient taking differently: Take 80 mg by mouth every morning. TAKE 1 TABLET BY MOUTH DAILY) 90 tablet 3   cholecalciferol (VITAMIN D3) 25 MCG (1000 UT) tablet Take 1,000 Units by mouth daily.     ezetimibe (ZETIA) 10 MG tablet TAKE ONE TABLET BY MOUTH EVERY DAY (Patient taking differently: Take 10 mg by mouth every morning. TAKE ONE TABLET BY MOUTH EVERY DAY) 90 tablet 3   hydrochlorothiazide (HYDRODIURIL) 12.5 MG tablet Take 1 tablet (12.5 mg total) by mouth daily. 90 tablet 3   ketoconazole (NIZORAL) 2 % cream Apply twice daily as directed. (Patient taking differently: 1 Application daily as needed for irritation. Apply twice daily as directed.) 60 g 2   lansoprazole (PREVACID) 30 MG capsule TAKE 1 CAPSULE BY MOUTH EVERY DAY (Patient taking differently: Take 30  mg by mouth every morning. TAKE 1 CAPSULE BY MOUTH EVERY DAY) 90 capsule 3   levothyroxine (SYNTHROID) 88 MCG tablet TAKE 1 TABLET EVERY DAY ON EMPTY STOMACHWITH A GLASS OF WATER AT LEAST 30-60 MINBEFORE BREAKFAST 90 tablet 3   lisinopril (ZESTRIL) 40 MG tablet Take 1 tablet (40 mg total) by mouth daily. (Patient taking differently: Take 40 mg by mouth every morning.) 90 tablet 3   mometasone (ELOCON) 0.1 % cream APPLY TO ITCHY SPOTS ON BODY 1-2 TIMES DAILY UNTIL IMPROVED; AVOID FACE, GROIN,AND UNDERARMS 45 g 1   Multiple Vitamin (MULTIVITAMIN) tablet Take 1 tablet by mouth daily.     naproxen (NAPROSYN) 500 MG tablet TAKE ONE TABLET BY MOUTH TWICE DAILY AS NEEDED FOR MODERATE PAIN.  TAKE WITH FOOD 90 tablet 1   Specialty Vitamins Products (ICAPS LUTEIN & ZEAXANTHIN PO) Take 1 tablet by mouth every morning. Zeaxanthin 4mg /Lutein 10mg      tacrolimus (PROTOPIC) 0.1 % ointment Apply 1-2 times daily as needed for itch to affected areas. (Patient taking differently: 1 Application as needed. Apply 1-2 times daily as needed for itch to affected areas.) 60 g 2   No current facility-administered medications for this visit.  Review of Systems General:  no complaints Skin: no complaints Eyes: no complaints HEENT: no complaints Breasts: no complaints Pulmonary: no complaints Cardiac: no complaints Gastrointestinal: no complaints Genitourinary/Sexual: no complaints Ob/Gyn: no complaints Musculoskeletal: per hpi Hematology: no complaints Neurologic/Psych: no complaints   Objective:  Physical Examination:  BP (!) 102/31   Pulse 78   Temp (!) 96.7 F (35.9 C)   Resp 20   Wt 239 lb 12.8 oz (108.8 kg)   SpO2 100%   BMI 42.48 kg/m    Body mass index is 42.48 kg/m.   GENERAL: Patient is a well appearing female in no acute distress HEENT:  Atraumatic and normocephalic. PERRL, neck supple. CV: Regular rate and rhythm Lungs: Clear to auscultation NODES:  No cervical, supraclavicular,  axillary, or inguinal lymphadenopathy palpated.  ABDOMEN:  Soft, nontender. Nondistended. No masses/ascites/hernia/or hepatomegaly.  EXTREMITIES:  No peripheral edema.   MSK: limited ROM of shoulders NEURO:  Nonfocal. Well oriented.  Appropriate affect.  Pelvic: exam chaperoned by CMA.  EGBUS: Multiple vulvar scars and post surgical changes to anatomy. Well healed. Scattered areas of hypopigmentation and diffuse erythema of left labia and vaginal introitus. Appear similar to previous exam. Right vulva- increased erythema of vaginal introitus, remains similar in size and well demarcated. No hypopigmented lesions seen.  Vagina: no lesions, discharge, or bleeding. Cervix: surgically absent. BME: negative for masses or nodularity. RV: deferred.      Previous exam:      Assessment:  BURDETTE BUETI is a 72 y.o. female diagnosed with recurrent Paget's disease of the vulva status post wide local excision with positive margins and subsequent recurrence in 07/2015.  Repeat wide local excision consistent with recurrent Paget's disease of the vulva status post wide local excision with negative margins.  Biopsy in 2018 was negative.  Biopsy in 2020 at 7:00 consistent with recurrent Paget's.  She deffered prior treatment due to breast cancer diagnosis and need for therapy. She has now completed treatment and she is ready to focus on management of Paget's disease.  01/16/20 started Imiquimod  completed 5 weeks of treatment then held 02/18/2020 due to ulceration, erythema, and pain. Restarted on 03/17/2020 and completed  08/14/2020. Negative biopsies 9/21. Opted for surveillance.  Overall essentially stable findings of biopsy proven pagets on exam. Clinically she is asymptomatic.    History of recurrent vulvar candidiasis.  Right breast cancer- stage Ia ER positive right breast cancer - S/p lumpectomy and radiation, now on aromatase inhibitor. She is followed by medical oncology/Dr. Donneta Romberg and Dr. Rushie Chestnut.    Low back pain  Shoulder pain- s/p arthroplasty     Plan:   Problem List Items Addressed This Visit       Musculoskeletal and Integument   Extramammary Paget disease - Primary   I again offered her surgical evaluation with Dr Sonia Side including EUA, ICG for mapping and biopsies vs Mohs surgery vs maintenance imiquimod vs surveillance. She continues to elect for surveillance given that she is asymptomatic and has relatively stable disease.   She will continue breast surveillance with Dr.Brahmanday.   Patient does request female gyn providers only. She will rotate and see Dr Sonia Side at next visit and plan for photos to track her disease.   Return to clinic in 6 months for re-evaluation.   Consuello Masse, DNP, AGNP-C, AOCNP [facility] [phone] (clinic)

## 2023-04-12 ENCOUNTER — Telehealth: Payer: Self-pay | Admitting: Family Medicine

## 2023-04-12 NOTE — Telephone Encounter (Signed)
Contacted [name] to schedule their annual wellness visit. Appointment made for 04/26/2023.  Verlee Rossetti; Care Guide Ambulatory Clinical Support [place] [facility] Direct Dial: [phone]

## 2023-04-18 DIAGNOSIS — Z96612 Presence of left artificial shoulder joint: Secondary | ICD-10-CM | POA: Diagnosis not present

## 2023-04-26 ENCOUNTER — Ambulatory Visit (INDEPENDENT_AMBULATORY_CARE_PROVIDER_SITE_OTHER): Payer: Medicare Other | Admitting: *Deleted

## 2023-04-26 DIAGNOSIS — Z0000 Encounter for general adult medical examination without abnormal findings: Secondary | ICD-10-CM

## 2023-04-26 NOTE — Patient Instructions (Signed)
Bailey Brooks , Thank you for taking time to come for your Medicare Wellness Visit. I appreciate your ongoing commitment to your health goals. Please review the following plan we discussed and let me know if I can assist you in the future.   Screening recommendations/referrals: Colonoscopy: up to date Mammogram: up to date Bone Density: up to date Recommended yearly ophthalmology/optometry visit for glaucoma screening and checkup Recommended yearly dental visit for hygiene and checkup  Vaccinations: Influenza vaccine: up to date Pneumococcal vaccine: rup to date Tdap vaccine: Education provided Shingles vaccine: up to date    Advanced directives: on file     Preventive Care 65 Years and Older, Female Preventive care refers to lifestyle choices and visits with your health care provider that can promote health and wellness. What does preventive care include? A yearly physical exam. This is also called an annual well check. Dental exams once or twice a year. Routine eye exams. Ask your health care provider how often you should have your eyes checked. Personal lifestyle choices, including: Daily care of your teeth and gums. Regular physical activity. Eating a healthy diet. Avoiding tobacco and drug use. Limiting alcohol use. Practicing safe sex. Taking low-dose aspirin every day. Taking vitamin and mineral supplements as recommended by your health care provider. What happens during an annual well check? The services and screenings done by your health care provider during your annual well check will depend on your age, overall health, lifestyle risk factors, and family history of disease. Counseling  Your health care provider may ask you questions about your: Alcohol use. Tobacco use. Drug use. Emotional well-being. Home and relationship well-being. Sexual activity. Eating habits. History of falls. Memory and ability to understand (cognition). Work and work  Astronomer. Reproductive health. Screening  You may have the following tests or measurements: Height, weight, and BMI. Blood pressure. Lipid and cholesterol levels. These may be checked every 5 years, or more frequently if you are over 37 years old. Skin check. Lung cancer screening. You may have this screening every year starting at age 67 if you have a 30-pack-year history of smoking and currently smoke or have quit within the past 15 years. Fecal occult blood test (FOBT) of the stool. You may have this test every year starting at age 109. Flexible sigmoidoscopy or colonoscopy. You may have a sigmoidoscopy every 5 years or a colonoscopy every 10 years starting at age 40. Hepatitis C blood test. Hepatitis B blood test. Sexually transmitted disease (STD) testing. Diabetes screening. This is done by checking your blood sugar (glucose) after you have not eaten for a while (fasting). You may have this done every 1-3 years. Bone density scan. This is done to screen for osteoporosis. You may have this done starting at age 51. Mammogram. This may be done every 1-2 years. Talk to your health care provider about how often you should have regular mammograms. Talk with your health care provider about your test results, treatment options, and if necessary, the need for more tests. Vaccines  Your health care provider may recommend certain vaccines, such as: Influenza vaccine. This is recommended every year. Tetanus, diphtheria, and acellular pertussis (Tdap, Td) vaccine. You may need a Td booster every 10 years. Zoster vaccine. You may need this after age 49. Pneumococcal 13-valent conjugate (PCV13) vaccine. One dose is recommended after age 75. Pneumococcal polysaccharide (PPSV23) vaccine. One dose is recommended after age 43. Talk to your health care provider about which screenings and vaccines you need and how  often you need them. This information is not intended to replace advice given to you by  your health care provider. Make sure you discuss any questions you have with your health care provider. Document Released: 12/17/2015 Document Revised: 08/09/2016 Document Reviewed: 09/21/2015 Elsevier Interactive Patient Education  2017 [employer].  Fall Prevention in the Home Falls can cause injuries. They can happen to people of all ages. There are many things you can do to make your home safe and to help prevent falls. What can I do on the outside of my home? Regularly fix the edges of walkways and driveways and fix any cracks. Remove anything that might make you trip as you walk through a door, such as a raised step or threshold. Trim any bushes or trees on the path to your home. Use bright outdoor lighting. Clear any walking paths of anything that might make someone trip, such as rocks or tools. Regularly check to see if handrails are loose or broken. Make sure that both sides of any steps have handrails. Any raised decks and porches should have guardrails on the edges. Have any leaves, snow, or ice cleared regularly. Use sand or salt on walking paths during winter. Clean up any spills in your garage right away. This includes oil or grease spills. What can I do in the bathroom? Use night lights. Install grab bars by the toilet and in the tub and shower. Do not use towel bars as grab bars. Use non-skid mats or decals in the tub or shower. If you need to sit down in the shower, use a plastic, non-slip stool. Keep the floor dry. Clean up any water that spills on the floor as soon as it happens. Remove soap buildup in the tub or shower regularly. Attach bath mats securely with double-sided non-slip rug tape. Do not have throw rugs and other things on the floor that can make you trip. What can I do in the bedroom? Use night lights. Make sure that you have a light by your bed that is easy to reach. Do not use any sheets or blankets that are too big for your bed. They should not hang  down onto the floor. Have a firm chair that has side arms. You can use this for support while you get dressed. Do not have throw rugs and other things on the floor that can make you trip. What can I do in the kitchen? Clean up any spills right away. Avoid walking on wet floors. Keep items that you use a lot in easy-to-reach places. If you need to reach something above you, use a strong step stool that has a grab bar. Keep electrical cords out of the way. Do not use floor polish or wax that makes floors slippery. If you must use wax, use non-skid floor wax. Do not have throw rugs and other things on the floor that can make you trip. What can I do with my stairs? Do not leave any items on the stairs. Make sure that there are handrails on both sides of the stairs and use them. Fix handrails that are broken or loose. Make sure that handrails are as long as the stairways. Check any carpeting to make sure that it is firmly attached to the stairs. Fix any carpet that is loose or worn. Avoid having throw rugs at the top or bottom of the stairs. If you do have throw rugs, attach them to the floor with carpet tape. Make sure that you have a  light switch at the top of the stairs and the bottom of the stairs. If you do not have them, ask someone to add them for you. What else can I do to help prevent falls? Wear shoes that: Do not have high heels. Have rubber bottoms. Are comfortable and fit you well. Are closed at the toe. Do not wear sandals. If you use a stepladder: Make sure that it is fully opened. Do not climb a closed stepladder. Make sure that both sides of the stepladder are locked into place. Ask someone to hold it for you, if possible. Clearly mark and make sure that you can see: Any grab bars or handrails. First and last steps. Where the edge of each step is. Use tools that help you move around (mobility aids) if they are needed. These  include: Canes. Walkers. Scooters. Crutches. Turn on the lights when you go into a dark area. Replace any light bulbs as soon as they burn out. Set up your furniture so you have a clear path. Avoid moving your furniture around. If any of your floors are uneven, fix them. If there are any pets around you, be aware of where they are. Review your medicines with your doctor. Some medicines can make you feel dizzy. This can increase your chance of falling. Ask your doctor what other things that you can do to help prevent falls. This information is not intended to replace advice given to you by your health care provider. Make sure you discuss any questions you have with your health care provider. Document Released: 09/16/2009 Document Revised: 04/27/2016 Document Reviewed: 12/25/2014 Elsevier Interactive Patient Education  2017 [employer].

## 2023-04-26 NOTE — Progress Notes (Signed)
Subjective:   Bailey Brooks is a 72 y.o. female who presents for Medicare Annual (Subsequent) preventive examination.  I connected with  Bailey Brooks on 04/26/23 by a telephone enabled telemedicine application and verified that I am speaking with the correct person using two identifiers.   I discussed the limitations of evaluation and management by telemedicine. The patient expressed understanding and agreed to proceed.  Patient location: home  Provider location: telephone/home   Review of Systems     Cardiac Risk Factors include: hypertension;advanced age (>85men, >22 women);sedentary lifestyle;obesity (BMI >30kg/m2)     Objective:    Today's Vitals   04/26/23 0905  PainSc: 3    There is no height or weight on file to calculate BMI.     04/26/2023    9:09 AM 04/11/2023    2:29 PM 01/25/2023   10:23 AM 01/15/2023   11:24 AM 11/14/2022    3:00 PM 10/04/2022    2:41 PM 07/24/2022   10:15 AM  Advanced Directives  Does Patient Have a Medical Advance Directive? Yes No Yes No Yes Yes Yes  Type of Forensic scientist of [place];Living will  Healthcare Power of [employer] of [place];Living will  Does patient want to make changes to medical advance directive?  No - Patient declined No - Patient declined  No - Patient declined    Copy of Healthcare Power of Attorney in Chart? No - copy requested  No - copy requested  No - copy requested    Would patient like information on creating a medical advance directive?  No - Patient declined  No - Patient declined       Current Medications (verified) Outpatient Encounter Medications as of 04/26/2023  Medication Sig   acetaminophen (TYLENOL) 500 MG tablet Take 1,000 mg by mouth daily at 6 (six) AM.   anastrozole (ARIMIDEX) 1 MG tablet TAKE ONE TABLET BY MOUTH DAILY   aspirin EC 81 MG tablet Take 81 mg by mouth daily. Swallow whole.   atorvastatin (LIPITOR) 80 MG tablet  TAKE 1 TABLET BY MOUTH DAILY (Patient taking differently: Take 80 mg by mouth every morning. TAKE 1 TABLET BY MOUTH DAILY)   cholecalciferol (VITAMIN D3) 25 MCG (1000 UT) tablet Take 1,000 Units by mouth daily.   ezetimibe (ZETIA) 10 MG tablet TAKE ONE TABLET BY MOUTH EVERY DAY (Patient taking differently: Take 10 mg by mouth every morning. TAKE ONE TABLET BY MOUTH EVERY DAY)   hydrochlorothiazide (HYDRODIURIL) 12.5 MG tablet Take 1 tablet (12.5 mg total) by mouth daily.   ketoconazole (NIZORAL) 2 % cream Apply twice daily as directed. (Patient taking differently: 1 Application daily as needed for irritation. Apply twice daily as directed.)   lansoprazole (PREVACID) 30 MG capsule TAKE 1 CAPSULE BY MOUTH EVERY DAY (Patient taking differently: Take 30 mg by mouth every morning. TAKE 1 CAPSULE BY MOUTH EVERY DAY)   levothyroxine (SYNTHROID) 88 MCG tablet TAKE 1 TABLET EVERY DAY ON EMPTY STOMACHWITH A GLASS OF WATER AT LEAST 30-60 MINBEFORE BREAKFAST   lisinopril (ZESTRIL) 40 MG tablet Take 1 tablet (40 mg total) by mouth daily. (Patient taking differently: Take 40 mg by mouth every morning.)   mometasone (ELOCON) 0.1 % cream APPLY TO ITCHY SPOTS ON BODY 1-2 TIMES DAILY UNTIL IMPROVED; AVOID FACE, GROIN,AND UNDERARMS   Multiple Vitamin (MULTIVITAMIN) tablet Take 1 tablet by mouth daily.   naproxen (NAPROSYN) 500 MG tablet TAKE ONE TABLET BY MOUTH TWICE  DAILY AS NEEDED FOR MODERATE PAIN.  TAKE WITH FOOD   Specialty Vitamins Products (ICAPS LUTEIN & ZEAXANTHIN PO) Take 1 tablet by mouth every morning. Zeaxanthin 4mg /Lutein 10mg    tacrolimus (PROTOPIC) 0.1 % ointment Apply 1-2 times daily as needed for itch to affected areas. (Patient taking differently: 1 Application as needed. Apply 1-2 times daily as needed for itch to affected areas.)   No facility-administered encounter medications on file as of 04/26/2023.    Allergies (verified) Morphine and codeine, Sulfa antibiotics, Iodine, Adhesive [tape],  Erythromycin, and Oxycodone   History: Past Medical History:  Diagnosis Date   Adult pulmonary Langerhans cell histiocytosis (HCC)    Eosinophilic Granuloma of the Lung)   Allergic rhinitis    Antineoplastic chemotherapy induced anemia    Arthritis    Breast cancer, right (HCC) 09/17/2019   a.) Bx (+) stage 1 IMC (G1, ER/PR +, Her2/neu -); pT1a N0 M0; Tx'd with adjuvant XRT + endocrine therapy   Complication of anesthesia    pt was awake during intubation for colonoscopy   Diastolic dysfunction    a.) TTE 11/14/2016: EF 60-65%, no rwma, mild LVH, triv MR, mildly dil LA, nl RVSF, G1DD; b. 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   Tubular adenoma of colon    Past Surgical History:   Procedure Laterality Date   ABDOMINAL HYSTERECTOMY  1985   Hysterectomy-partial   BREAST BIOPSY Right 2002   Neg - AT [facility]   BREAST BIOPSY Right 2020   bx done at [facility]?, IDC and DCIS   BREAST LUMPECTOMY Right 10/16/2019   IDC and DCIS, negative LN   BURR HOLE W/ PLACEMENT OMMAYA RESERVOIR     BURR HOLE W/ PLACEMENT OMMAYA RESERVOIR  2012   COLONOSCOPY  04/2013   ESOPHAGOGASTRODUODENOSCOPY  04/2013   INJECTION KNEE  07/02/2018   INSERTION CENTRAL VENOUS ACCESS DEVICE W/ SUBCUTANEOUS PORT  2011   Port a Cath: Right chest Double Lumen, 04-Nov-2010   LIMBAL STEM CELL TRANSPLANT  2012   LIVER BIOPSY     stage 4B large Bcell lymphoma   PARTIAL MASTECTOMY WITH NEEDLE LOCALIZATION AND AXILLARY  SENTINEL LYMPH NODE BX Right 10/16/2019   Procedure: PARTIAL MASTECTOMY WITH NEEDLE LOCALIZATION AND AXILLARY SENTINEL LYMPH NODE BX;  Surgeon: Sung Amabile, DO;  Location: [facility] ORS;  Service: General;  Laterality: Right;   REVERSE SHOULDER ARTHROPLASTY Left 11/14/2022   Procedure: REVERSE SHOULDER ARTHROPLASTY WITH BICEPS TENODESIS.;  Surgeon: Christena Flake, MD;  Location: [facility] ORS;  Service: Orthopedics;  Laterality: Left;   ROTATOR CUFF REPAIR Right 2016   SHOULDER ARTHROSCOPY Right 06/22/2015   Procedure: ARTHROSCOPY SHOULDER, parital repair of rotator cuff, biceps tenodesis, decompression and debridement;  Surgeon: Christena Flake, MD;  Location: [facility] ORS;  Service: Orthopedics;  Laterality: Right;   TONSILLECTOMY  1954   VULVECTOMY     VULVECTOMY PARTIAL N/A 09/29/2015   Procedure: VULVECTOMY PARTIAL;  Surgeon: Artelia Laroche, MD;  Location: [facility] OR[facility];  Service: Gynecology;  Laterality: N/A;   Family History  Problem Relation Age of Onset   Diabetes Mother        died @ 66 of MI.   Hypertension Mother    Heart attack Mother    Melanoma Father 42       died of complications r/t melanoma w/ lung mets.   Kidney disease Sister        Kidney removed    Breast cancer Sister 13   Prostate  cancer Brother        Prostate - dx in 8's   Heart disease Brother 42       reported MI @ age 42, ? treated w/ TPA->no recurrent CAD, now in 67's.   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  No pets.  Retired from [employer].  Activity limited by chronic back pain-sedentary.   Right-handed   Caffeine: occasional caffeine free/diet soda or hot tea   Social Determinants of Health   Financial Resource Strain: Low Risk  (04/26/2023)   Overall Financial Resource Strain (CARDIA)    Difficulty of Paying Living Expenses: Not hard at all  Food Insecurity: No Food Insecurity (04/26/2023)   Hunger Vital Sign    Worried About Running Out of Food in the Last Year: Never true    Ran Out of Food in the Last Year: Never true  Transportation Needs: No Transportation Needs (04/26/2023)   PRAPARE - Transportation    Lack of Transportation (Medical): No    Lack  of Transportation (Non-Medical): No  Physical Activity: Inactive (04/26/2023)   Exercise Vital Sign    Days of Exercise per Week: 0 days     Minutes of Exercise per Session: 0 min  Stress: No Stress Concern Present (04/26/2023)   [employer] of Occupational Health - Occupational Stress Questionnaire    Feeling of Stress : Not at all  Social Connections: Moderately Integrated (04/26/2023)   Social Connection and Isolation Panel [NHANES]    Frequency of Communication with Friends and Family: Three times a week    Frequency of Social Gatherings with Friends and Family: Once a week    Attends Religious Services: More than 4 times per year    Active Member of [employer] or Organizations: No    Attends Engineer, structural: Never    Marital Status: Married    Tobacco Counseling Counseling given: Not Answered   Clinical Intake:  Pre-visit preparation completed: Yes  Pain : 0-10 Pain Score: 3  Pain Type: Chronic pain (joint pain) Pain Descriptors / Indicators: Burning, Aching, Dull, Constant Pain Onset: More than a month ago Pain Frequency: Intermittent Pain Relieving Factors: Naproxen,tylenol  Pain Relieving Factors: Naproxen,tylenol  Nutritional Risks: None Diabetes: No  How often do you need to have someone help you when you read instructions, pamphlets, or other written materials from your doctor or pharmacy?: 1 - Never  Diabetic?  no  Interpreter Needed?: No  Information entered by :: Remi Haggard LPN   Activities of Daily Living    04/26/2023    9:10 AM 04/25/2023    9:39 AM  In your present state of health, do you have any difficulty performing the following activities:  Hearing? 0 0  Vision? 0 0  Difficulty concentrating or making decisions? 0 0  Walking or climbing stairs? 0 0  Dressing or bathing? 0 0  Doing errands, shopping? 0   Preparing Food and eating ? N N  Using the Toilet? N N  In the past six months, have you accidently leaked urine? N N  Do you have problems with loss of bowel control? N N  Managing your Medications? N N  Managing your Finances? N N  Housekeeping or managing  your Housekeeping? N N    Patient Care Team: Glori Luis, MD as PCP - General (Family Medicine) Iran Ouch, MD as PCP - Cardiology (Cardiology) Benita Gutter, RN as Registered Nurse Earna Coder, MD as Consulting Physician (Oncology)  Indicate any recent Medical Services you may have received from other than [facility] providers in the past year (date may be approximate).     Assessment:   This is a routine wellness examination for [place].  Hearing/Vision screen Hearing Screening - Comments:: No trouble hearing  Vision Screening - Comments:: [facility] Up to date  Dietary issues and exercise activities discussed: Current Exercise Habits: The patient does not participate in regular exercise at present   Goals Addressed             This Visit's Progress    Weight (lb) < 200 lb (90.7 kg)         Depression Screen    04/26/2023    9:16 AM 02/21/2023    9:57 AM 01/26/2023    3:45 PM 10/25/2022    4:29 PM 07/10/2022    2:09 PM 03/13/2022   11:50 AM 01/03/2022   10:11 AM  PHQ 2/9 Scores  PHQ - 2 Score 2 0 0 0 0 0 0  PHQ- 9 Score 3 0 0        Fall Risk    04/26/2023    9:07 AM 04/25/2023    9:39 AM 02/21/2023    9:56 AM 01/26/2023    3:44 PM 10/25/2022    4:28 PM  Fall Risk   Falls in the past year? 0 0 0 0 1  Number falls in past yr: 0  0 0 0  Injury with Fall? 0  0 0 1  Risk for fall due to :   No Fall Risks No Fall Risks History of fall(s)  Follow up Falls evaluation completed;Education provided;Falls prevention discussed  Falls evaluation completed Falls evaluation completed Falls evaluation completed    FALL RISK PREVENTION PERTAINING TO THE HOME:  Any stairs in or around the home? No  If so, are there any without handrails? No  Home free of loose throw rugs in walkways, pet beds, electrical cords, etc? Yes  Adequate lighting in your home to reduce risk of falls? Yes   ASSISTIVE DEVICES UTILIZED TO PREVENT FALLS:  Life alert? No   Use of a cane, walker or w/c? No  Grab bars in the bathroom? Yes  Shower chair or bench in shower? Yes  Elevated toilet seat or a handicapped toilet? No   TIMED UP AND GO:  Was the test performed? No .    Cognitive Function:        04/26/2023    9:11 AM  6CIT Screen  What Year? 0 points  What month? 0 points  What time? 0 points  Count back from 20 0 points  Months in reverse 0 points  Repeat phrase 0 points  Total Score 0 points    Immunizations Immunization History  Administered Date(s) Administered   HIB (PRP-OMP) 10/11/2012   Hepatitis B 10/11/2012   IPV 10/11/2012   Influenza Split 09/04/2011, 08/14/2012, 09/23/2014   Influenza, High Dose Seasonal PF 09/21/2021, 09/07/2022   Influenza,inj,Quad PF,6+ Mos 08/16/2016, 08/20/2019   Influenza-Unspecified 09/25/2017, 09/24/2018, 08/18/2020   Moderna Covid-19 Vaccine Bivalent Booster 29yrs & up 09/30/2021   PFIZER(Purple Top)SARS-COV-2 Vaccination 01/30/2020, 02/24/2020, 07/25/2020   Pneumococcal Conjugate-13 12/20/2018   Pneumococcal Polysaccharide-23 03/07/2015   Tdap 10/06/2012   Unspecified SARS-COV-2 Vaccination 09/07/2022   Zoster Recombinat (Shingrix) 07/10/2022, 10/03/2022    TDAP status: Due, Education has been provided regarding the importance of this vaccine. Advised may receive this vaccine at local pharmacy or Health Dept. Aware to provide a copy of the vaccination record if obtained from local pharmacy or Health Dept. Verbalized acceptance and understanding.  Flu Vaccine status: Up to date  Pneumococcal vaccine status: Up to date  Covid-19 vaccine status: Information provided on how to obtain vaccines.   Qualifies for Shingles Vaccine? No   Zostavax completed Yes   Shingrix Completed?: Yes  Screening Tests Health Maintenance  Topic Date Due   DTaP/Tdap/Td (2 - Td or Tdap) 10/06/2022   COVID-19 Vaccine (6 - 2023-24 season) 05/12/2023 (Originally 11/02/2022)   INFLUENZA VACCINE  07/05/2023    MAMMOGRAM  02/09/2024   COLONOSCOPY (Pts 45-29yrs Insurance coverage will need to be confirmed)  01/14/2031   Pneumonia Vaccine 18+ Years old  Completed   DEXA SCAN  Completed   Hepatitis C Screening  Completed   Zoster Vaccines- Shingrix  Completed   HPV VACCINES  Aged Out    Health Maintenance  Health Maintenance Due  Topic Date Due   DTaP/Tdap/Td (2 - Td or Tdap) 10/06/2022    Colorectal cancer  screening: Type of screening: Colonoscopy. Completed 2022. Repeat every 10 years  Mammogram status: Completed  . Repeat every year  Bone Density status: Completed 2023. Results reflect: Bone density results: NORMAL. Repeat every 5 years.  Lung Cancer Screening: (Low Dose CT Chest recommended if Age 12-80 years, 30 pack-year currently smoking OR have quit w/in 15years.) does not qualify.   Lung Cancer Screening Referral:   Additional Screening:  Hepatitis C Screening: does not qualify; Completed 2020  Vision Screening: Recommended annual ophthalmology exams for early detection of glaucoma and other disorders of the eye. Is the patient up to date with their annual eye exam?  Yes  Who is the provider or what is the name of the office in which the patient attends annual eye exams? [facility] If pt is not established with a provider, would they like to be referred to a provider to establish care? No .   Dental Screening: Recommended annual dental exams for proper oral hygiene  Community Resource Referral / Chronic Care Management: CRR required this visit?  No   CCM required this visit?  No      Plan:     I have personally reviewed and noted the following in the patient's chart:   Medical and social history Use of alcohol, tobacco or illicit drugs  Current medications and supplements including opioid prescriptions. Patient is not currently taking opioid prescriptions. Functional ability and status Nutritional status Physical activity Advanced directives List of other  physicians Hospitalizations, surgeries, and ER visits in previous 12 months Vitals Screenings to include cognitive, depression, and falls Referrals and appointments  In addition, I have reviewed and discussed with patient certain preventive protocols, quality metrics, and best practice recommendations. A written personalized care plan for preventive services as well as general preventive health recommendations were provided to patient.     Remi Haggard, LPN   1/61/0960   Nurse Notes:

## 2023-04-27 ENCOUNTER — Other Ambulatory Visit: Payer: Self-pay | Admitting: Family Medicine

## 2023-04-27 DIAGNOSIS — I10 Essential (primary) hypertension: Secondary | ICD-10-CM

## 2023-05-01 DIAGNOSIS — Z96612 Presence of left artificial shoulder joint: Secondary | ICD-10-CM | POA: Diagnosis not present

## 2023-05-01 DIAGNOSIS — M25612 Stiffness of left shoulder, not elsewhere classified: Secondary | ICD-10-CM | POA: Diagnosis not present

## 2023-05-14 DIAGNOSIS — Z96612 Presence of left artificial shoulder joint: Secondary | ICD-10-CM | POA: Diagnosis not present

## 2023-05-14 DIAGNOSIS — M12812 Other specific arthropathies, not elsewhere classified, left shoulder: Secondary | ICD-10-CM | POA: Diagnosis not present

## 2023-05-15 DIAGNOSIS — Z96612 Presence of left artificial shoulder joint: Secondary | ICD-10-CM | POA: Diagnosis not present

## 2023-05-23 DIAGNOSIS — M25612 Stiffness of left shoulder, not elsewhere classified: Secondary | ICD-10-CM | POA: Diagnosis not present

## 2023-05-23 DIAGNOSIS — Z96612 Presence of left artificial shoulder joint: Secondary | ICD-10-CM | POA: Diagnosis not present

## 2023-05-30 DIAGNOSIS — Z96612 Presence of left artificial shoulder joint: Secondary | ICD-10-CM | POA: Diagnosis not present

## 2023-06-05 ENCOUNTER — Encounter: Payer: Self-pay | Admitting: Emergency Medicine

## 2023-06-05 ENCOUNTER — Other Ambulatory Visit: Payer: Self-pay

## 2023-06-05 ENCOUNTER — Other Ambulatory Visit: Payer: Self-pay | Admitting: Family Medicine

## 2023-06-05 ENCOUNTER — Emergency Department
Admission: EM | Admit: 2023-06-05 | Discharge: 2023-06-05 | Disposition: A | Discharge status: Home / Self Care | Payer: Medicare Other | Attending: Emergency Medicine | Admitting: Emergency Medicine

## 2023-06-05 DIAGNOSIS — N289 Disorder of kidney and ureter, unspecified: Secondary | ICD-10-CM | POA: Diagnosis not present

## 2023-06-05 DIAGNOSIS — R55 Syncope and collapse: Secondary | ICD-10-CM | POA: Diagnosis not present

## 2023-06-05 DIAGNOSIS — I110 Hypertensive heart disease with heart failure: Secondary | ICD-10-CM | POA: Diagnosis not present

## 2023-06-05 DIAGNOSIS — I509 Heart failure, unspecified: Secondary | ICD-10-CM | POA: Insufficient documentation

## 2023-06-05 DIAGNOSIS — N390 Urinary tract infection, site not specified: Secondary | ICD-10-CM

## 2023-06-05 DIAGNOSIS — I10 Essential (primary) hypertension: Secondary | ICD-10-CM

## 2023-06-05 LAB — URINALYSIS, ROUTINE W REFLEX MICROSCOPIC
Bilirubin Urine: NEGATIVE
Glucose, UA: NEGATIVE mg/dL
Hgb urine dipstick: NEGATIVE
Ketones, ur: NEGATIVE mg/dL
Nitrite: NEGATIVE
Protein, ur: 30 mg/dL — AB
Specific Gravity, Urine: 1.026 (ref 1.005–1.030)
WBC, UA: >50 WBC/hpf (ref 0–5)
pH: 5 (ref 5.0–8.0)

## 2023-06-05 LAB — BASIC METABOLIC PANEL
Anion gap: 13 (ref 5–15)
BUN: 31 mg/dL — ABNORMAL HIGH (ref 8–23)
CO2: 23 mmol/L (ref 22–32)
Calcium: 9.5 mg/dL (ref 8.9–10.3)
Chloride: 101 mmol/L (ref 98–111)
Creatinine, Ser: 1.29 mg/dL — ABNORMAL HIGH (ref 0.44–1.00)
GFR, Estimated: 44 mL/min — ABNORMAL LOW (ref >60)
Glucose, Bld: 138 mg/dL — ABNORMAL HIGH (ref 70–99)
Potassium: 3.8 mmol/L (ref 3.5–5.1)
Sodium: 137 mmol/L (ref 135–145)

## 2023-06-05 LAB — CBC
HCT: 40.2 % (ref 36.0–46.0)
Hemoglobin: 12.8 g/dL (ref 12.0–15.0)
MCH: 28.7 pg (ref 26.0–34.0)
MCHC: 31.8 g/dL (ref 30.0–36.0)
MCV: 90.1 fL (ref 80.0–100.0)
Platelets: 214 10*3/uL (ref 150–400)
RBC: 4.46 MIL/uL (ref 3.87–5.11)
RDW: 15 % (ref 11.5–15.5)
WBC: 8.1 10*3/uL (ref 4.0–10.5)
nRBC: 0 % (ref 0.0–0.2)

## 2023-06-05 LAB — TROPONIN I (HIGH SENSITIVITY): Troponin I (High Sensitivity): 12 ng/L (ref <18)

## 2023-06-05 MED ORDER — CEPHALEXIN 500 MG PO CAPS
500.0000 mg | ORAL_CAPSULE | Freq: Once | ORAL | Status: AC
  Administered 2023-06-05: 500 mg via ORAL
  Filled 2023-06-05: qty 1

## 2023-06-05 MED ORDER — SODIUM CHLORIDE 0.9 % IV BOLUS
1000.0000 mL | Freq: Once | INTRAVENOUS | Status: AC
  Administered 2023-06-05: 1000 mL via INTRAVENOUS

## 2023-06-05 MED ORDER — CEPHALEXIN 500 MG PO CAPS: 500.0000 mg | ORAL_CAPSULE | Freq: Two times a day (BID) | ORAL | 0 refills | Status: DC

## 2023-06-05 NOTE — ED Notes (Signed)
Pt assisted up to toilet at this time to provide urine sample.  Pt noted to have steady gait with ambulation.

## 2023-06-05 NOTE — ED Triage Notes (Signed)
Pt arrived via wheelchair from room 7 where she was visiting a family member and reports she passed out while sitting down, pt states she feels weak and concerned she might be dehydrated. Pt states she hasn't been drinking much fluid.  Pt states she passed out about an hour ago but initially refused to be seen.

## 2023-06-05 NOTE — ED Provider Notes (Signed)
[facility] Provider Note    Event Date/Time   First MD Initiated Contact with Patient 06/05/23 0134     (approximate)  History   Chief Complaint: Loss of Consciousness  HPI  Bailey Brooks is a 72 y.o. female with a past medical history of CHF, gastric reflux, hypertension, presents to the emergency department after syncopal episode.  According to the patient she has a long history of syncopal episodes, she was actually in the emergency department visiting her husband who is a patient when she became lightheaded flushed and had a syncopal episode.  Patient initially decided not to be seen but states she was concerned she could be dehydrated so she decided to check in to the emergency department approximate hour after the event.  States event occurred around midnight tonight.  Patient denies any chest pain or shortness of breath at any point.  States he was in a chair did not fall to the ground did not hit her head.  Physical Exam   Triage Vital Signs: ED Triage Vitals  Enc Vitals Group     BP 06/05/23 0108 (!) 123/52     Pulse Rate 06/05/23 0108 (!) 56     Resp 06/05/23 0108 16     Temp 06/05/23 0108 98 F (36.7 C)     Temp Source 06/05/23 0108 Oral     SpO2 06/05/23 0108 99 %     Weight 06/05/23 0105 239 lb (108.4 kg)     Height 06/05/23 0105 5\' 3"  (1.6 m)     Head Circumference --      Peak Flow --      Pain Score 06/05/23 0105 0     Pain Loc --      Pain Edu? --      Excl. in GC? --     Most recent vital signs: Vitals:   06/05/23 0108  BP: (!) 123/52  Pulse: (!) 56  Resp: 16  Temp: 98 F (36.7 C)  SpO2: 99%    General: Awake, no distress.  CV:  Good peripheral perfusion.  Regular rate and rhythm  Resp:  Normal effort.  Equal breath sounds bilaterally.  Abd:  No distention.  Soft, nontender.  No rebound or guarding.  ED Results / Procedures / Treatments   EKG  EKG viewed and interpreted by myself shows a sinus rhythm at 56 bpm  with a narrow QRS, normal axis, normal intervals, no concerning ST changes.  MEDICATIONS ORDERED IN ED: Medications  sodium chloride 0.9 % bolus 1,000 mL (has no administration in time range)     IMPRESSION / MDM / ASSESSMENT AND PLAN / ED COURSE  I reviewed the triage vital signs and the nursing notes.  Patient's presentation is most consistent with acute presentation with potential threat to life or bodily function.  Patient presents to the emergency department for a syncopal episode while visiting a patient in the emergency department.  Patient states that history of multiple syncopal episodes previously.  Patient states she has not been eating or drinking very much recently and thinks she could be dehydrated.  Patient denies any chest pain or shortness of breath.  Patient's lab work shows overall reassuring chemistry mild renal insufficiency with anion gap of 13 possibly indicating mild dehydration.  CBC shows no concerning findings and troponin is negative.  EKG reassuring.  We will IV hydrate and continue to closely monitor.  As long as the patient is feeling well after IV hydration  anticipate likely discharge home with PCP follow-up.  Patient agreeable to plan of care.  Patient is feeling much better after fluids.  Patient's urinalysis does show greater than 50 white cells with rare bacteria we will cover with Keflex as a precaution.  Will have the patient follow-up with her doctor.  Discussed increasing oral hydration over the next 2 to 3 days.  Provided typical return precautions.  FINAL CLINICAL IMPRESSION(S) / ED DIAGNOSES   Syncope  Note:  This document was prepared using Dragon voice recognition software and may include unintentional dictation errors.   Minna Antis, MD 06/05/23 [phone]

## 2023-06-06 DIAGNOSIS — Z96612 Presence of left artificial shoulder joint: Secondary | ICD-10-CM | POA: Diagnosis not present

## 2023-06-12 ENCOUNTER — Telehealth: Payer: Self-pay

## 2023-06-12 ENCOUNTER — Other Ambulatory Visit: Payer: Self-pay | Admitting: Internal Medicine

## 2023-06-12 DIAGNOSIS — K2100 Gastro-esophageal reflux disease with esophagitis, without bleeding: Secondary | ICD-10-CM

## 2023-06-12 NOTE — Telephone Encounter (Signed)
Transition Care Management Unsuccessful Follow-up Telephone Call  Date of discharge and from where:  [facility] 7/2  Attempts:  1st Attempt  Reason for unsuccessful TCM follow-up call:  Left voice message   [name] [facility], [place] [phone] [address], [place], [place] [address] Phone: [phone] Email: [email or web address]       

## 2023-06-29 ENCOUNTER — Encounter: Payer: Self-pay | Admitting: Family Medicine

## 2023-07-19 ENCOUNTER — Encounter (INDEPENDENT_AMBULATORY_CARE_PROVIDER_SITE_OTHER): Payer: Self-pay

## 2023-07-26 ENCOUNTER — Inpatient Hospital Stay: Payer: Medicare Other | Attending: Internal Medicine | Admitting: Internal Medicine

## 2023-07-26 ENCOUNTER — Encounter: Payer: Self-pay | Admitting: Internal Medicine

## 2023-07-26 VITALS — BP 152/84 | HR 84 | Temp 99.0°F | Ht 63.0 in | Wt 242.4 lb

## 2023-07-26 DIAGNOSIS — Z8042 Family history of malignant neoplasm of prostate: Secondary | ICD-10-CM | POA: Insufficient documentation

## 2023-07-26 DIAGNOSIS — Z8051 Family history of malignant neoplasm of kidney: Secondary | ICD-10-CM | POA: Insufficient documentation

## 2023-07-26 DIAGNOSIS — C50411 Malignant neoplasm of upper-outer quadrant of right female breast: Secondary | ICD-10-CM | POA: Diagnosis not present

## 2023-07-26 DIAGNOSIS — Z8673 Personal history of transient ischemic attack (TIA), and cerebral infarction without residual deficits: Secondary | ICD-10-CM | POA: Diagnosis not present

## 2023-07-26 DIAGNOSIS — Z170 Estrogen receptor positive status [ER+]: Secondary | ICD-10-CM | POA: Diagnosis not present

## 2023-07-26 DIAGNOSIS — Z803 Family history of malignant neoplasm of breast: Secondary | ICD-10-CM | POA: Diagnosis not present

## 2023-07-26 DIAGNOSIS — Z808 Family history of malignant neoplasm of other organs or systems: Secondary | ICD-10-CM | POA: Diagnosis not present

## 2023-07-26 DIAGNOSIS — Z90710 Acquired absence of both cervix and uterus: Secondary | ICD-10-CM | POA: Insufficient documentation

## 2023-07-26 DIAGNOSIS — G4733 Obstructive sleep apnea (adult) (pediatric): Secondary | ICD-10-CM | POA: Diagnosis not present

## 2023-07-26 DIAGNOSIS — R5383 Other fatigue: Secondary | ICD-10-CM | POA: Insufficient documentation

## 2023-07-26 DIAGNOSIS — Z79811 Long term (current) use of aromatase inhibitors: Secondary | ICD-10-CM | POA: Insufficient documentation

## 2023-07-26 DIAGNOSIS — Z8572 Personal history of non-Hodgkin lymphomas: Secondary | ICD-10-CM | POA: Insufficient documentation

## 2023-07-26 NOTE — Progress Notes (Signed)
[facility] OFFICE PROGRESS NOTE  Patient Care Team: Glori Luis, MD as PCP - General (Family Medicine) Iran Ouch, MD as PCP - Cardiology (Cardiology) Benita Gutter, RN as Registered Nurse Earna Coder, MD as Consulting Physician (Oncology)   Cancer Staging  No matching staging information was found for the patient.    Oncology History Overview Note  # RIGHT BREAST CANCER stage I [pTapN0; G-1; ER-100%; PR-99%; Her-2-NEG; Dr.Sakai]. NO Oncotype;s/p RT [12/29/2019]  # MARCH 1st 2021- Anastrzole;  STOPPED anastrozole [stopped in April, 2023]; June 2nd, 2023- Examestane 25 mg/day.     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   DIAGNOSIS: Right breast  STAGE:   I      ;  GOALS: cure    History of lymphoma  Carcinoma of upper-outer quadrant of right breast in female, estrogen receptor positive (HCC)  10/28/2019 Initial Diagnosis   Carcinoma of upper-outer quadrant of right breast in female, estrogen receptor positive (HCC)   03/20/2020 Genetic Testing   Negative genetic testing on the multi-cancer panel.  The Multi-Gene Panel offered by [facility] includes sequencing and/or deletion duplication testing of the following 85 genes: AIP, ALK, APC, ATM, AXIN2,BAP1,  BARD1, BLM, BMPR1A, BRCA1, BRCA2, BRIP1, CASR, CDC73, CDH1, CDK4, CDKN1B, CDKN1C, CDKN2A (p14ARF), CDKN2A (p16INK4a), CEBPA, CHEK2, CTNNA1, DICER1, DIS3L2, EGFR (c.2369C>T, p.Thr790Met variant only), EPCAM (Deletion/duplication testing only), FH, FLCN, GATA2, GPC3, GREM1 (Promoter region deletion/duplication testing only), HOXB13 (c.251G>A, p.Gly84Glu), HRAS, KIT, MAX,  MEN1, MET, MITF (c.952G>A, p.Glu318Lys variant only), MLH1, MSH2, MSH3, MSH6, MUTYH, NBN, NF1, NF2, NTHL1, PALB2, PDGFRA, PHOX2B, PMS2, POLD1, POLE, POT1, PRKAR1A, PTCH1, PTEN, RAD50, RAD51C, RAD51D, RB1, RECQL4, RET, RNF43, RUNX1, SDHAF2, SDHA (sequence changes only), SDHB, SDHC, SDHD, SMAD4, SMARCA4, SMARCB1, SMARCE1, STK11, SUFU, TERC, TERT, TMEM127, TP53, TSC1, TSC2, VHL, WRN and WT1.  The report date is March 20, 2020.    INTERVAL HISTORY: Ambulating independently.  Alone.   [name] 72 y.o.  female pleasant patient stage I ER/PR positive HER-2 negative breast cancer most r currently on adjuvant anatraszole is here for follow-up.  Pt taking her anastrozole. Has aching in her joints and fatigue from this medication. Takes naproxen as needed for the pain.   Appetite is good. Denies any breast pain. No new lumps in breast.     Review of Systems  Constitutional:  Positive for malaise/fatigue. Negative for chills, diaphoresis and fever.  HENT:  Negative for nosebleeds and sore throat.   Eyes:  Negative for double vision.  Respiratory:  Negative for cough, hemoptysis, sputum production, shortness of breath and wheezing.   Cardiovascular:  Negative for chest pain, palpitations, orthopnea and leg swelling.  Gastrointestinal:  Negative for abdominal pain, blood in stool, constipation, diarrhea, heartburn, melena, nausea and vomiting.  Genitourinary:  Negative for dysuria, frequency and urgency.  Musculoskeletal:  Positive for back pain and joint pain.  Skin: Negative.  Negative for itching and rash.  Neurological:  Negative for dizziness, tingling, focal weakness, weakness and headaches.  Endo/Heme/Allergies:  Does not bruise/bleed easily.  Psychiatric/Behavioral:  Negative for depression. The patient is not nervous/anxious and does not have insomnia.       PAST MEDICAL HISTORY :  Past Medical History:  Diagnosis Date   Adult  pulmonary Langerhans cell histiocytosis (HCC)     Eosinophilic Granuloma of the Lung)   Allergic rhinitis    Antineoplastic chemotherapy induced anemia    Arthritis    Breast cancer, right (HCC) 09/17/2019   a.) Bx (+) stage 1 IMC (G1, ER/PR +, Her2/neu -); pT1a N0 M0; Tx'd with adjuvant XRT + endocrine therapy   Complication of anesthesia    pt was awake during intubation for colonoscopy   Diastolic dysfunction    a.) TTE 11/14/2016: EF 60-65%, no rwma, mild LVH, triv MR, mildly dil LA, nl RVSF, G1DD; b. 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   Tubular adenoma of colon     PAST SURGICAL HISTORY :   Past Surgical History:  Procedure Laterality Date   ABDOMINAL HYSTERECTOMY  1985   Hysterectomy-partial   BREAST BIOPSY Right 2002    Neg - AT [facility]   BREAST BIOPSY Right 2020   bx done at [facility]?, IDC and DCIS   BREAST LUMPECTOMY Right 10/16/2019   IDC and DCIS, negative LN   BURR HOLE W/ PLACEMENT OMMAYA RESERVOIR     BURR HOLE W/ PLACEMENT OMMAYA RESERVOIR  2012   COLONOSCOPY  04/2013   ESOPHAGOGASTRODUODENOSCOPY  04/2013   INJECTION KNEE  07/02/2018   INSERTION CENTRAL VENOUS ACCESS DEVICE W/ SUBCUTANEOUS PORT  2011   Port a Cath: Right chest Double Lumen, 04-Nov-2010   LIMBAL STEM CELL TRANSPLANT  2012   LIVER BIOPSY     stage 4B large Bcell lymphoma   PARTIAL MASTECTOMY WITH NEEDLE LOCALIZATION AND AXILLARY SENTINEL LYMPH NODE BX Right 10/16/2019   Procedure: PARTIAL MASTECTOMY WITH NEEDLE LOCALIZATION AND AXILLARY SENTINEL LYMPH NODE BX;  Surgeon: Sung Amabile, DO;  Location: [facility] ORS;  Service: General;  Laterality: Right;   REVERSE SHOULDER ARTHROPLASTY Left 11/14/2022   Procedure: REVERSE SHOULDER ARTHROPLASTY WITH BICEPS TENODESIS.;  Surgeon: Christena Flake, MD;  Location: [facility] OR[facility];  Service: Orthopedics;  Laterality: Left;   ROTATOR CUFF REPAIR Right 2016   SHOULDER ARTHROSCOPY Right 06/22/2015   Procedure: ARTHROSCOPY SHOULDER, parital repair of rotator cuff, biceps tenodesis, decompression and debridement;  Surgeon: Jonny Ruiz  Fidel Levy, MD;  Location: [facility] ORS;  Service: Orthopedics;  Laterality: Right;   TONSILLECTOMY  1954   VULVECTOMY     VULVECTOMY PARTIAL N/A 09/29/2015   Procedure: VULVECTOMY PARTIAL;  Surgeon: Artelia Laroche, MD;  Location: [facility] OR[facility];  Service: Gynecology;  Laterality: N/A;    FAMILY HISTORY :   Family History  Problem Relation Age of Onset   Diabetes Mother        died @ 26 of MI.   Hypertension Mother    Heart attack Mother    Melanoma Father 70       died of complications r/t melanoma w/ lung mets.   Kidney disease Sister        Kidney removed    Breast cancer Sister 13   Prostate cancer Brother        Prostate - dx in 93's   Heart disease Brother 67       reported MI @  age 36, ? treated w/ TPA->no recurrent CAD, now in 55's.   Hearing loss Maternal Aunt    Cancer Maternal Aunt        pancreatic vs colon cancer   Hearing loss Maternal Grandfather    Other Maternal Grandmother        flu pandemic   Kidney cancer Niece 20       partial nephrectomy    SOCIAL HISTORY:   Social History   Tobacco Use   Smoking status: Never   Smokeless tobacco: Never  Vaping Use   Vaping status: Never Used  Substance Use Topics   Alcohol use: No    Alcohol/week: 0.0 standard drinks of alcohol   Drug use: No    ALLERGIES:  is allergic to morphine and codeine, sulfa antibiotics, iodine, adhesive [tape], erythromycin, and oxycodone.  MEDICATIONS:  Current Outpatient Medications  Medication Sig Dispense Refill   acetaminophen (TYLENOL) 500 MG tablet Take 1,000 mg by mouth daily at 6 (six) AM.     anastrozole (ARIMIDEX) 1 MG tablet TAKE ONE TABLET BY MOUTH DAILY 90 tablet 1   aspirin EC 81 MG tablet Take 81 mg by mouth daily. Swallow whole.     atorvastatin (LIPITOR) 80 MG tablet TAKE 1 TABLET BY MOUTH DAILY (Patient taking differently: Take 80 mg by mouth every morning. TAKE 1 TABLET BY MOUTH DAILY) 90 tablet 3   cholecalciferol (VITAMIN D3) 25 MCG (1000 UT) tablet Take 1,000 Units by mouth daily.     ezetimibe (ZETIA) 10 MG tablet TAKE ONE TABLET BY MOUTH EVERY DAY (Patient taking differently: Take 10 mg by mouth every morning. TAKE ONE TABLET BY MOUTH EVERY DAY) 90 tablet 3   hydrochlorothiazide (HYDRODIURIL) 12.5 MG tablet TAKE ONE TABLET BY MOUTH EVERY DAY 90 tablet 3   ketoconazole (NIZORAL) 2 % cream Apply twice daily as directed. (Patient taking differently: 1 Application daily as needed for irritation. Apply twice daily as directed.) 60 g 2   lansoprazole (PREVACID) 30 MG capsule TAKE 1 CAPSULE BY MOUTH ONCE DAILY 90 capsule 1   levothyroxine (SYNTHROID) 88 MCG tablet TAKE 1 TABLET EVERY DAY ON EMPTY STOMACHWITH A GLASS OF WATER AT LEAST 30-60 MINBEFORE  BREAKFAST 90 tablet 3   lisinopril (ZESTRIL) 40 MG tablet TAKE 1 TABLET BY MOUTH DAILY 90 tablet 3   mometasone (ELOCON) 0.1 % cream APPLY TO ITCHY SPOTS ON BODY 1-2 TIMES DAILY UNTIL IMPROVED; AVOID FACE, GROIN,AND UNDERARMS 45 g 1   Multiple Vitamin (MULTIVITAMIN) tablet Take 1 tablet by mouth  daily.     naproxen (NAPROSYN) 500 MG tablet TAKE ONE TABLET BY MOUTH TWICE DAILY AS NEEDED FOR MODERATE PAIN.  TAKE WITH FOOD 90 tablet 1   Specialty Vitamins Products (ICAPS LUTEIN & ZEAXANTHIN PO) Take 1 tablet by mouth every morning. Zeaxanthin 4mg /Lutein 10mg      tacrolimus (PROTOPIC) 0.1 % ointment Apply 1-2 times daily as needed for itch to affected areas. (Patient taking differently: 1 Application as needed. Apply 1-2 times daily as needed for itch to affected areas.) 60 g 2   No current facility-administered medications for this visit.   PHYSICAL EXAMINATION: ECOG PERFORMANCE STATUS: 0 - Asymptomatic  BP (!) 152/84 (BP Location: Left Arm, Patient Position: Sitting, Cuff Size: Normal)   Pulse 84   Temp 99 F (37.2 C) (Tympanic)   Ht 5\' 3"  (1.6 m)   Wt 242 lb 6.4 oz (110 kg)   SpO2 96%   BMI 42.94 kg/m   Filed Weights   07/26/23 1040  Weight: 242 lb 6.4 oz (110 kg)     Physical Activity: Inactive (04/26/2023)   Exercise Vital Sign    Days of Exercise per Week: 0 days    Minutes of Exercise per Session: 0 min   Physical Exam HENT:     Head: Normocephalic and atraumatic.     Mouth/Throat:     Pharynx: No oropharyngeal exudate.  Eyes:     Pupils: Pupils are equal, round, and reactive to light.  Cardiovascular:     Rate and Rhythm: Normal rate and regular rhythm.  Pulmonary:     Effort: No respiratory distress.     Breath sounds: No wheezing.  Abdominal:     General: Bowel sounds are normal. There is no distension.     Palpations: Abdomen is soft. There is no mass.     Tenderness: There is no abdominal tenderness. There is no guarding or rebound.  Musculoskeletal:         General: No tenderness. Normal range of motion.     Cervical back: Normal range of motion and neck supple.  Skin:    General: Skin is warm.  Neurological:     Mental Status: She is alert and oriented to person, place, and time.  Psychiatric:        Mood and Affect: Affect normal.    LABORATORY DATA:  I have reviewed the data as listed    Component Value Date/Time   NA 137 06/05/2023 0118   NA 138 09/03/2013 0809   K 3.8 06/05/2023 0118   K 4.3 11/02/2014 0932   CL 101 06/05/2023 0118   CL 105 09/03/2013 0809   CO2 23 06/05/2023 0118   CO2 28 09/03/2013 0809   GLUCOSE 138 (H) 06/05/2023 0118   GLUCOSE 100 (H) 09/03/2013 0809   BUN 31 (H) 06/05/2023 0118   BUN 17 09/03/2013 0809   CREATININE 1.29 (H) 06/05/2023 0118   CREATININE 0.96 11/02/2014 0929   CALCIUM 9.5 06/05/2023 0118   CALCIUM 9.1 09/03/2013 0809   PROT 7.3 11/06/2022 1546   PROT 6.6 11/02/2014 0929   ALBUMIN 3.8 11/06/2022 1546   ALBUMIN 3.3 (L) 11/02/2014 0929   AST 27 11/06/2022 1546   AST 16 11/02/2014 0929   ALT 22 11/06/2022 1546   ALT 19 11/02/2014 0929   ALKPHOS 94 11/06/2022 1546   ALKPHOS 87 11/02/2014 0929   BILITOT 0.8 11/06/2022 1546   BILITOT 0.3 11/02/2014 0929   GFRNONAA 44 (L) 06/05/2023 0118   GFRNONAA >60 11/02/2014  0929   GFRNONAA >60 07/27/2014 0836   GFRAA >60 07/05/2020 0923   GFRAA >60 11/02/2014 0929   GFRAA >60 07/27/2014 0836    No results found for: "SPEP", "UPEP"  Lab Results  Component Value Date   WBC 8.1 06/05/2023   NEUTROABS 3.5 11/06/2022   HGB 12.8 06/05/2023   HCT 40.2 06/05/2023   MCV 90.1 06/05/2023   PLT 214 06/05/2023      Chemistry      Component Value Date/Time   NA 137 06/05/2023 0118   NA 138 09/03/2013 0809   K 3.8 06/05/2023 0118   K 4.3 11/02/2014 0932   CL 101 06/05/2023 0118   CL 105 09/03/2013 0809   CO2 23 06/05/2023 0118   CO2 28 09/03/2013 0809   BUN 31 (H) 06/05/2023 0118   BUN 17 09/03/2013 0809   CREATININE 1.29 (H) 06/05/2023  0118   CREATININE 0.96 11/02/2014 0929      Component Value Date/Time   CALCIUM 9.5 06/05/2023 0118   CALCIUM 9.1 09/03/2013 0809   ALKPHOS 94 11/06/2022 1546   ALKPHOS 87 11/02/2014 0929   AST 27 11/06/2022 1546   AST 16 11/02/2014 0929   ALT 22 11/06/2022 1546   ALT 19 11/02/2014 0929   BILITOT 0.8 11/06/2022 1546   BILITOT 0.3 11/02/2014 0929       RADIOGRAPHIC STUDIES: I have personally reviewed the radiological images as listed and agreed with the findings in the report. No results found.   ASSESSMENT & PLAN:  Carcinoma of upper-outer quadrant of right breast in female, estrogen receptor positive (HCC) # Right breast cancer stage I ER/PRPos; HER-2 negative. Anastrozole [until spring, 2026].; march 2024-NEG.   # MSK grade 2-secondary to adjuvant AI; continue Osteo Bi-Flex.- stable.  # ? TIA-on monitor [April 2023] on asprin [off plavix]  stable.  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 Sonia Side.  # Bone density test-February 2023-T-score 0.1-normal limits continue calcium plus vitamin D. Discussed/ and reviewed. Will repeat in 2-3 years.   # Fatigue:reluctant with OSA/CPAP; considering CARE program.   mychart  # DISPOSITION: # Follow up in 7 months- MD; labs- cbc/cmp; Vit D 25-OH;  BIL diagnostic mammogram in march-  Dr.B   No orders of the defined types were placed in this encounter.  All questions were answered. The patient knows to call the clinic with any problems, questions or concerns.      Earna Coder, MD 07/26/2023 11:12 AM

## 2023-07-26 NOTE — Progress Notes (Signed)
No questions or concerns today 

## 2023-07-26 NOTE — Assessment & Plan Note (Addendum)
#   Right breast cancer stage I ER/PRPos; HER-2 negative. Anastrozole [until spring, 2026].; march 2024-NEG.   # MSK grade 2-secondary to adjuvant AI; continue Osteo Bi-Flex.- stable.  # ? TIA-on monitor [April 2023] on asprin [off plavix]  stable.  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 Sonia Side.  # Bone density test-February 2023-T-score 0.1-normal limits continue calcium plus vitamin D. Discussed/ and reviewed. Will repeat in 2-3 years.   # Fatigue:reluctant with OSA/CPAP; considering CARE program.   mychart  # DISPOSITION: # Follow up in 7 months- MD; labs- cbc/cmp; Vit D 25-OH;  BIL diagnostic mammogram in march-  Dr.B

## 2023-07-27 ENCOUNTER — Encounter: Payer: Self-pay | Admitting: Family Medicine

## 2023-07-27 ENCOUNTER — Other Ambulatory Visit: Payer: Self-pay | Admitting: Family Medicine

## 2023-08-24 ENCOUNTER — Encounter: Payer: Self-pay | Admitting: Family Medicine

## 2023-08-24 ENCOUNTER — Ambulatory Visit (INDEPENDENT_AMBULATORY_CARE_PROVIDER_SITE_OTHER): Payer: Medicare Other | Admitting: Family Medicine

## 2023-08-24 VITALS — BP 136/76 | HR 79 | Temp 97.9°F | Ht 63.0 in | Wt 201.6 lb

## 2023-08-24 DIAGNOSIS — E039 Hypothyroidism, unspecified: Secondary | ICD-10-CM

## 2023-08-24 DIAGNOSIS — R809 Proteinuria, unspecified: Secondary | ICD-10-CM | POA: Diagnosis not present

## 2023-08-24 DIAGNOSIS — I10 Essential (primary) hypertension: Secondary | ICD-10-CM | POA: Diagnosis not present

## 2023-08-24 DIAGNOSIS — L603 Nail dystrophy: Secondary | ICD-10-CM | POA: Diagnosis not present

## 2023-08-24 DIAGNOSIS — Z23 Encounter for immunization: Secondary | ICD-10-CM | POA: Diagnosis not present

## 2023-08-24 DIAGNOSIS — G8929 Other chronic pain: Secondary | ICD-10-CM | POA: Diagnosis not present

## 2023-08-24 DIAGNOSIS — M545 Low back pain, unspecified: Secondary | ICD-10-CM

## 2023-08-24 LAB — MICROALBUMIN / CREATININE URINE RATIO
Creatinine,U: 186.5 mg/dL
Microalb Creat Ratio: 2 mg/g (ref 0.0–30.0)
Microalb, Ur: 3.7 mg/dL — ABNORMAL HIGH (ref 0.0–1.9)

## 2023-08-24 LAB — BASIC METABOLIC PANEL
BUN: 24 mg/dL — ABNORMAL HIGH (ref 6–23)
CO2: 25 mEq/L (ref 19–32)
Calcium: 9.6 mg/dL (ref 8.4–10.5)
Chloride: 104 mEq/L (ref 96–112)
Creatinine, Ser: 0.89 mg/dL (ref 0.40–1.20)
GFR: 64.25 mL/min (ref >60.00)
Glucose, Bld: 114 mg/dL — ABNORMAL HIGH (ref 70–99)
Potassium: 4.2 mEq/L (ref 3.5–5.1)
Sodium: 139 mEq/L (ref 135–145)

## 2023-08-24 LAB — TSH: TSH: 1.11 u[IU]/mL (ref 0.35–5.50)

## 2023-08-24 NOTE — Assessment & Plan Note (Signed)
Noted on testing through insurance company.  Recheck today.

## 2023-08-24 NOTE — Assessment & Plan Note (Signed)
Chronic issue.  Adequately controlled.  She will continue lisinopril 40 mg daily and HCTZ 12.5 mg daily.  Check labs today.

## 2023-08-24 NOTE — Progress Notes (Signed)
Marikay Alar, MD Phone: [phone]  Bailey Brooks is a 72 y.o. female who presents today for f/u.  HYPERTENSION Disease Monitoring Home BP Monitoring not checking Chest pain- no    Dyspnea- no Medications Compliance-  taking hydrochlorothiazide, lisinopril. Edema- no BMET    Component Value Date/Time   NA 137 06/05/2023 0118   NA 138 09/03/2013 0809   K 3.8 06/05/2023 0118   K 4.3 11/02/2014 0932   CL 101 06/05/2023 0118   CL 105 09/03/2013 0809   CO2 23 06/05/2023 0118   CO2 28 09/03/2013 0809   GLUCOSE 138 (H) 06/05/2023 0118   GLUCOSE 100 (H) 09/03/2013 0809   BUN 31 (H) 06/05/2023 0118   BUN 17 09/03/2013 0809   CREATININE 1.29 (H) 06/05/2023 0118   CREATININE 0.96 11/02/2014 0929   CALCIUM 9.5 06/05/2023 0118   CALCIUM 9.1 09/03/2013 0809   GFRNONAA 44 (L) 06/05/2023 0118   GFRNONAA >60 11/02/2014 0929   GFRNONAA >60 07/27/2014 0836   GFRAA >60 07/05/2020 0923   GFRAA >60 11/02/2014 0929   GFRAA >60 07/27/2014 0836   HYPOTHYROIDISM Disease Monitoring Weight changes: no  Skin Changes: no Heat/Cold intolerance: heat  Medication Monitoring Compliance:  taking synthroid   Last TSH:   Lab Results  Component Value Date   TSH 1.462 04/02/2022   Chronic low back pain: This continues to be an issue.  She is going to taking Naprosyn twice daily along with Tylenol twice daily.  Still has back pain.  Reports she saw physical medicine and rehab previously and was advised to have an MRI though she did not proceed with that given outside factors.  Toenail dystrophy: Patient notes she saw podiatry in the past and was diagnosed with toenail dystrophy.  Notes she has trouble getting this clipped and does see a manicurist to have it shaved down at times.  Abnormal albumin creatinine ratio: This was done through her insurance company.  She is on lisinopril already.  Social History   Tobacco Use  Smoking Status Never  Smokeless Tobacco Never    Current  Outpatient Medications on File Prior to Visit  Medication Sig Dispense Refill   acetaminophen (TYLENOL) 500 MG tablet Take 1,000 mg by mouth daily at 6 (six) AM.     anastrozole (ARIMIDEX) 1 MG tablet TAKE ONE TABLET BY MOUTH DAILY 90 tablet 1   aspirin EC 81 MG tablet Take 81 mg by mouth daily. Swallow whole.     atorvastatin (LIPITOR) 80 MG tablet TAKE 1 TABLET BY MOUTH DAILY (Patient taking differently: Take 80 mg by mouth every morning. TAKE 1 TABLET BY MOUTH DAILY) 90 tablet 3   cholecalciferol (VITAMIN D3) 25 MCG (1000 UT) tablet Take 1,000 Units by mouth daily.     ezetimibe (ZETIA) 10 MG tablet TAKE ONE TABLET BY MOUTH EVERY DAY (Patient taking differently: Take 10 mg by mouth every morning. TAKE ONE TABLET BY MOUTH EVERY DAY) 90 tablet 3   hydrochlorothiazide (HYDRODIURIL) 12.5 MG tablet TAKE ONE TABLET BY MOUTH EVERY DAY 90 tablet 3   ketoconazole (NIZORAL) 2 % cream Apply twice daily as directed. (Patient taking differently: 1 Application daily as needed for irritation. Apply twice daily as directed.) 60 g 2   lansoprazole (PREVACID) 30 MG capsule TAKE 1 CAPSULE BY MOUTH ONCE DAILY 90 capsule 1   levothyroxine (SYNTHROID) 88 MCG tablet TAKE 1 TABLET EVERY DAY ON EMPTY STOMACHWITH A GLASS OF WATER AT LEAST 30-60 MINBEFORE BREAKFAST 90 tablet 3  lisinopril (ZESTRIL) 40 MG tablet TAKE 1 TABLET BY MOUTH DAILY 90 tablet 3   mometasone (ELOCON) 0.1 % cream APPLY TO ITCHY SPOTS ON BODY 1-2 TIMES DAILY UNTIL IMPROVED; AVOID FACE, GROIN,AND UNDERARMS 45 g 1   Multiple Vitamin (MULTIVITAMIN) tablet Take 1 tablet by mouth daily.     naproxen (NAPROSYN) 500 MG tablet TAKE 1 TABLET BY MOUTH TWICE DAILY AS NEEDED FOR MODERATE PAIN (TAKE WITH FOOD) 90 tablet 1   Specialty Vitamins Products (ICAPS LUTEIN & ZEAXANTHIN PO) Take 1 tablet by mouth every morning. Zeaxanthin 4mg /Lutein 10mg      tacrolimus (PROTOPIC) 0.1 % ointment Apply 1-2 times daily as needed for itch to affected areas. (Patient taking  differently: 1 Application as needed. Apply 1-2 times daily as needed for itch to affected areas.) 60 g 2   No current facility-administered medications on file prior to visit.     ROS see history of present illness  Objective  Physical Exam There were no vitals filed for this visit.  BP Readings from Last 3 Encounters:  07/26/23 (!) 152/84  06/05/23 (!) 136/55  04/11/23 (!) 102/31   Wt Readings from Last 3 Encounters:  07/26/23 242 lb 6.4 oz (110 kg)  06/05/23 239 lb (108.4 kg)  04/11/23 239 lb 12.8 oz (108.8 kg)    Physical Exam Constitutional:      General: She is not in acute distress.    Appearance: She is not diaphoretic.  Cardiovascular:     Rate and Rhythm: Normal rate and regular rhythm.     Heart sounds: Normal heart sounds.  Pulmonary:     Effort: Pulmonary effort is normal.     Breath sounds: Normal breath sounds.  Musculoskeletal:     Right lower leg: No edema.     Left lower leg: No edema.     Comments: No midline spine tenderness, no midline spine step-off, there is lumbar muscular back tenderness bilaterally  Skin:    General: Skin is warm and dry.     Comments: Thickened toenail right great and second toenails  Neurological:     Mental Status: She is alert.      Assessment/Plan: Please see individual problem list.  Essential hypertension Assessment & Plan: Chronic issue.  Adequately controlled.  She will continue lisinopril 40 mg daily and HCTZ 12.5 mg daily.  Check labs today.  Orders: -     Basic metabolic panel -     Microalbumin / creatinine urine ratio  Hypothyroidism, unspecified type Assessment & Plan: Chronic issue.  Continue Synthroid 88 mcg once daily.  Check TSH.  Orders: -     TSH  Chronic bilateral low back pain without sciatica Assessment & Plan: Chronic issue.  Refer to pain management to get their input.  Orders: -     Ambulatory referral to Pain Clinic  Nail dystrophy Assessment & Plan: Chronic issue.  Refer to  podiatry.  Orders: -     Ambulatory referral to Podiatry  Albuminuria Assessment & Plan: Noted on testing through insurance company.  Recheck today.     Return in about 6 months (around 02/21/2024).   Marikay Alar, MD [facility]

## 2023-08-24 NOTE — Assessment & Plan Note (Signed)
Chronic issue.  Refer to pain management to get their input.

## 2023-08-24 NOTE — Assessment & Plan Note (Signed)
Chronic issue.  Continue Synthroid 88 mcg once daily.  Check TSH.

## 2023-08-24 NOTE — Assessment & Plan Note (Signed)
Chronic issue.  Refer to podiatry.

## 2023-09-03 ENCOUNTER — Encounter: Payer: Self-pay | Admitting: Family Medicine

## 2023-09-06 ENCOUNTER — Ambulatory Visit: Payer: Medicare Other | Admitting: Podiatry

## 2023-09-06 DIAGNOSIS — M79674 Pain in right toe(s): Secondary | ICD-10-CM | POA: Diagnosis not present

## 2023-09-06 DIAGNOSIS — B351 Tinea unguium: Secondary | ICD-10-CM

## 2023-09-06 DIAGNOSIS — M79675 Pain in left toe(s): Secondary | ICD-10-CM | POA: Diagnosis not present

## 2023-09-06 NOTE — Progress Notes (Signed)
Complaint:  Visit Type: Patient presents  to my office for  preventative foot care services. Complaint: Patient states" my big  nails have grown long and thick and become painful to walk and wear shoes" The patient presents for preventative foot care services. No changes to ROS  Podiatric Exam: Vascular: dorsalis pedis and posterior tibial pulses are palpable bilateral. Capillary return is immediate. Temperature gradient is WNL. Skin turgor WNL  Sensorium: Normal Semmes Weinstein monofilament test. Normal tactile sensation bilaterally. Nail Exam: Pt has thick disfigured discolored nails with subungual debris noted bilateral hallux nails and second right . Ulcer Exam: There is no evidence of ulcer or pre-ulcerative changes or infection. Orthopedic Exam: Muscle tone and strength are WNL. No limitations in general ROM. No crepitus or effusions noted. Foot type and digits show no abnormalities. Bony prominences are unremarkable. Skin: No Porokeratosis. No infection or ulcers  Diagnosis:  Onychomycosis, , Pain in right toe, pain in left toes  Treatment & Plan Procedures and Treatment: Consent by patient was obtained for treatment procedures.   Debridement of mycotic and hypertrophic toenails, 1 through 5 bilateral and clearing of subungual debris. No ulceration, no infection noted.  Return Visit-Office Procedure: Patient instructed to return to the office for a follow up visit prn for continued evaluation and treatment.    Gardiner Barefoot DPM

## 2023-09-11 ENCOUNTER — Telehealth: Payer: Self-pay | Admitting: Family

## 2023-09-11 ENCOUNTER — Other Ambulatory Visit: Payer: Self-pay | Admitting: Family Medicine

## 2023-09-11 DIAGNOSIS — E785 Hyperlipidemia, unspecified: Secondary | ICD-10-CM

## 2023-09-14 ENCOUNTER — Other Ambulatory Visit: Payer: Self-pay

## 2023-09-14 DIAGNOSIS — E785 Hyperlipidemia, unspecified: Secondary | ICD-10-CM

## 2023-09-14 MED ORDER — ATORVASTATIN CALCIUM 80 MG PO TABS
ORAL_TABLET | ORAL | 3 refills | Status: DC
Start: 2023-09-14 — End: 2024-07-01

## 2023-09-14 NOTE — Telephone Encounter (Signed)
Prescription Request  09/14/2023  LOV: Visit date not found  What is the name of the medication or equipment? atorvastatin (LIPITOR) 80 MG tablet   Have you contacted your pharmacy to request a refill? Yes   Which pharmacy would you like this sent to?  [facility] - [place], [place] - [address] [place] [address] Phone: [phone] Fax: [phone]    Patient notified that their request is being sent to the clinical staff for review and that they should receive a response within 2 business days.   Please advise at Mobile [phone] (mobile)

## 2023-09-27 NOTE — Patient Instructions (Signed)

## 2023-09-27 NOTE — Progress Notes (Unsigned)
Patient: Bailey Brooks  Service Category: E/M  Provider: Oswaldo Done, MD  DOB: September 29, 1951  DOS: 10/01/2023  Referring Provider: Glori Luis, MD  MRN: [id number]  Setting: Ambulatory outpatient  PCP: Glori Luis, MD  Type: New Patient  Specialty: Interventional Pain Management    Location: Office  Delivery: Face-to-face     Primary Reason(s) for Visit: Encounter for initial evaluation of one or more chronic problems (new to examiner) potentially causing chronic pain, and posing a threat to normal musculoskeletal function. (Level of risk: High) CC: Back Pain (low)  HPI  Bailey Brooks is a 72 y.o. year old, female patient, who comes for the first time to our practice referred by Glori Luis, MD for our initial evaluation of her chronic pain. She has Hypothyroidism; HLD (hyperlipidemia); Muscle cramps; Eczema; Paget disease, extra mammary; Adjustment disorder with mixed anxiety and depressed mood; Extramammary Paget disease; Hypomagnesemia; Morbid obesity due to excess calories (HCC); Essential hypertension; Chronic hip pain (3ry area of Pain) (Right); History of transient ischemic attack (TIA); Hypokalemia; Chronic low back pain (1ry area of Pain) (Bilateral) (R>L) w/o sciatica; Vulvar lesion; History of lymphoma; Bruising; Macular degeneration; GERD (gastroesophageal reflux disease); Hypersomnia; Chronic knee pain (4th area of Pain) (Bilateral) (R>L); Prediabetes; Pain due to onychomycosis of toenails of both feet; Carcinoma of upper-outer quadrant of right breast in female, estrogen receptor positive (HCC); Family history of breast cancer; Family history of prostate cancer; Family history of melanoma; Genetic testing; Fatigue; Low serum vitamin B12; Syncope; Yeast vaginitis; TIA (transient ischemic attack); 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 Today she comes in for evaluation of her Back Pain (low)  Pain Assessment: Location: Lower Back Radiating: radiates into both groins but right is worse Onset: More than a month ago Duration: Chronic pain Quality: Aching, Constant Severity: 4 /10 (subjective, self-reported pain score)  Effect on ADL: Has difficulty standing or walking for periods of time Timing: Constant Modifying factors: sitting BP: (!) 149/79  HR: 67  Onset and Duration: Gradual Cause of pain: Unknown Severity: Getting worse, NAS-11 at its worse: 8/10, NAS-11 at its best: 2/10, NAS-11 now: 5/10, and NAS-11 on the average: 5/10 Timing: Not influenced by the time of the day, During activity or exercise, and After activity or exercise Aggravating Factors: Bending, Climbing, Lifiting, Motion, Prolonged standing, Squatting, Stooping , and Walking uphill Alleviating Factors: Resting and Sitting Associated Problems: Fatigue, Sweating, Pain that wakes patient up, and Pain that does not allow patient to sleep Quality of Pain: Aching, Exhausting, Nagging, Throbbing, and Uncomfortable Previous Examinations or Tests: X-rays and Orthopedic evaluation Previous Treatments: The patient denies treatments.  Bailey Brooks is being evaluated for possible interventional pain management therapies for the treatment of her chronic pain.   According to the patient the primary area of pain is that of the lower back (Bilateral) (R>L).  The patient denies any prior surgeries but does indicate having had physical therapy last  year which lasted for approximately 6 months and it seemed to help to a certain degree.  The  patient denies any nerve blocks but does indicate having had some x-rays at Dr. Marisa Sprinkles practice and 2023.  This particular pain is described by the patient has been constant and worsened with prolonged standing.  Although the patient denies having lower extremity pain, she does complain of groin pain, hip pain, knee pain, difficulty walking, and intermittent numbness and weakness of the lower extremities.  The patient also describes being unable to walk for any significant distance without having to stop and sit down.  She refers all of her symptoms to improve when she sits down.  This is highly suspicious for lumbar spinal stenosis with neurogenic claudication.  The patient's secondary area pain is that of her groin (Bilateral) (R>L).  This pain is described to be intermittent and primarily triggered by standing up and walking.  The patient denies any surgeries in the area, physical therapy, nerve blocks, or imaging studies of the area.  The patient's third area pain is described to be that of the hips (Bilateral) (R>L).  She describes this pain has also been intermittent and more than a pain she describes it as a difficulty in moving the hip joints.  She was provided some prednisone which did seem to help with the lower extremity pain which resolved and currently she is not experiencing any leg pain or hip pain.  The patient's fourth area pain is that of the knees (Bilateral).  She refers this pain to be intermittent and currently is not active.  She refers not having any pain today.  The patient indicates that this pain has been treated by Dr. Joice Lofts who has done 2 knee injections on the right knee and 1 intra-articular knee injection on the left.  These injections do seem to provide her with some benefit.  The fifth area pain is that of the shoulders (Bilateral).  She also describes this pain as intermittent and  currently not active.  She refers that this has also been treated by Dr. Joice Lofts where he did a left total shoulder replacement.  Medical comorbidities: The patient has a history of breast cancer and lymphoma.  She currently takes anastrozole (Arimidex) for her cancer, which she has to continue taking until March 2026.  She refers that she is convinced that some of the back pain is coming from this medication since she had to stop it for a while and her low back pain significantly went down.  Research on the side effects and adverse reactions of this medications do confirm that 36% of patients will experience side effects such as morning stiffness and low back pain.  In addition, she indicates that she also takes Lipitor which has also been associated with muscle pain and low back pain.  Physical exam: Today the patient had significant difficulty in standing up straight and hyperextension of the lumbar spine was impossible due to increased pain.  The patient also has an antalgic gait where she is walking with her back flexed.  She had difficulty with toe walking and heel walking suggestive of lower extremity weakness.  Physical exam today was highly suggestive of lumbar spinal stenosis.  Imaging: The record indicates that prior to coming to our practice the patient had been sent to the [facility] PMR group where Dr. Filomena Jungling had apparently ordered an MRI of the lumbar spine, but the patient indicates that she did not go to have it done secondary to concerns regarding the co-pay and her current financial status where she feels she would not be  able to pay for it.  Today I have talked to the patient about this and I do agree that she needs to have an MRI of the lumbar spine since her evaluation today was highly suspicious for that of lumbar spinal stenosis with neurogenic claudication.  Bailey Brooks has been informed that this initial visit was an evaluation only.  On the follow up appointment I will  go over the results, including ordered tests and available interventional therapies. At that time she will have the opportunity to decide whether to proceed with offered therapies or not. In the event that Bailey Brooks prefers avoiding interventional options, this will conclude our involvement in the case.  Medication management recommendations may be provided upon request.  Patient informed that diagnostic tests may be ordered to assist in identifying underlying causes, narrow the list of differential diagnoses and aid in determining candidacy for (or contraindications to) planned therapeutic interventions.  Historic Controlled Substance Pharmacotherapy Review  PMP and historical list of controlled substances: Diazepam 2 mg tablet (# 2) (last filled on 01/03/2023); hydrocodone/APAP 5/325 (# 40), 2 tabs p.o. 4 times daily (last filled on 11/15/2022) Most recently prescribed opioid analgesics:   None MME/day: 0 mg/day  Historical Monitoring: The patient  reports no history of drug use. List of prior UDS Testing: Lab Results  Component Value Date   COCAINSCRNUR NONE DETECTED 02/10/2017   THCU NONE DETECTED 02/10/2017   ETH <5 02/10/2017   Historical Background Evaluation: [place] PMP: PDMP reviewed during this encounter. Review of the past 44-months conducted.             PMP NARX Score Report:  Narcotic: 100 Sedative: 060 Stimulant: 000 [place] Department of public safety, offender search: Engineer, mining Information) Non-contributory Risk Assessment Profile: Aberrant behavior: None observed or detected today Risk factors for fatal opioid overdose: None identified today PMP NARX Overdose Risk Score: 000 Fatal overdose hazard ratio (HR): Calculation deferred Non-fatal overdose hazard ratio (HR): Calculation deferred Risk of opioid abuse or dependence: 0.7-3.0% with doses <= 36 MME/day and 6.1-26% with doses >= 120 MME/day. Substance use disorder (SUD) risk level: See below Personal History of Substance  Abuse (SUD-Substance use disorder):  Alcohol: Negative  Illegal Drugs: Negative  Rx Drugs: Negative  ORT Risk Level calculation: Low Risk  Opioid Risk Tool - 10/01/23 1126       Family History of Substance Abuse   Alcohol Negative    Illegal Drugs Negative    Rx Drugs Negative      Personal History of Substance Abuse   Alcohol Negative    Illegal Drugs Negative    Rx Drugs Negative      Age   Age between 16-45 years  No      History of Preadolescent Sexual Abuse   History of Preadolescent Sexual Abuse Negative or Female      Psychological Disease   Psychological Disease Negative    Depression Negative      Total Score   Opioid Risk Tool Scoring 0    Opioid Risk Interpretation Low Risk            ORT Scoring interpretation table:  Score <3 = Low Risk for SUD  Score between 4-7 = Moderate Risk for SUD  Score >8 = High Risk for Opioid Abuse   PHQ-2 Depression Scale:  Total score: 0  PHQ-2 Scoring interpretation table: (Score and probability of major depressive disorder)  Score 0 = No depression  Score 1 = 15.4% Probability  Score  2 = 21.1% Probability  Score 3 = 38.4% Probability  Score 4 = 45.5% Probability  Score 5 = 56.4% Probability  Score 6 = 78.6% Probability   PHQ-9 Depression Scale:  Total score: 0  PHQ-9 Scoring interpretation table:  Score 0-4 = No depression  Score 5-9 = Mild depression  Score 10-14 = Moderate depression  Score 15-19 = Moderately severe depression  Score 20-27 = Severe depression (2.4 times higher risk of SUD and 2.89 times higher risk of overuse)   Pharmacologic Plan: As per protocol, I have not taken over any controlled substance management, pending the results of ordered tests and/or consults.            Initial impression: Pending review of available data and ordered tests.  Meds   Current Outpatient Medications:    acetaminophen (TYLENOL) 500 MG tablet, Take 1,000 mg by mouth in the morning and at bedtime., Disp: , Rfl:     anastrozole (ARIMIDEX) 1 MG tablet, TAKE ONE TABLET BY MOUTH DAILY, Disp: 90 tablet, Rfl: 1   aspirin EC 81 MG tablet, Take 81 mg by mouth daily. Swallow whole., Disp: , Rfl:    atorvastatin (LIPITOR) 80 MG tablet, TAKE 1 TABLET BY MOUTH DAILY, Disp: 90 tablet, Rfl: 3   cholecalciferol (VITAMIN D3) 25 MCG (1000 UT) tablet, Take 1,000 Units by mouth daily., Disp: , Rfl:    ezetimibe (ZETIA) 10 MG tablet, TAKE ONE TABLET BY MOUTH EVERY DAY (Patient taking differently: Take 10 mg by mouth every morning. TAKE ONE TABLET BY MOUTH EVERY DAY), Disp: 90 tablet, Rfl: 3   hydrochlorothiazide (HYDRODIURIL) 12.5 MG tablet, TAKE ONE TABLET BY MOUTH EVERY DAY, Disp: 90 tablet, Rfl: 3   ketoconazole (NIZORAL) 2 % cream, Apply twice daily as directed. (Patient taking differently: 1 Application daily as needed for irritation. Apply twice daily as directed.), Disp: 60 g, Rfl: 2   lansoprazole (PREVACID) 30 MG capsule, TAKE 1 CAPSULE BY MOUTH ONCE DAILY, Disp: 90 capsule, Rfl: 1   levothyroxine (SYNTHROID) 88 MCG tablet, TAKE 1 TABLET EVERY DAY ON EMPTY STOMACHWITH A GLASS OF WATER AT LEAST 30-60 MINBEFORE BREAKFAST, Disp: 90 tablet, Rfl: 3   lisinopril (ZESTRIL) 40 MG tablet, TAKE 1 TABLET BY MOUTH DAILY, Disp: 90 tablet, Rfl: 3   mometasone (ELOCON) 0.1 % cream, APPLY TO ITCHY SPOTS ON BODY 1-2 TIMES DAILY UNTIL IMPROVED; AVOID FACE, GROIN,AND UNDERARMS, Disp: 45 g, Rfl: 1   Multiple Vitamin (MULTIVITAMIN) tablet, Take 1 tablet by mouth daily., Disp: , Rfl:    naproxen (NAPROSYN) 500 MG tablet, TAKE 1 TABLET BY MOUTH TWICE DAILY AS NEEDED FOR MODERATE PAIN (TAKE WITH FOOD), Disp: 90 tablet, Rfl: 1   Specialty Vitamins Products (ICAPS LUTEIN & ZEAXANTHIN PO), Take 1 tablet by mouth every morning. Zeaxanthin 4mg /Lutein 10mg , Disp: , Rfl:    tacrolimus (PROTOPIC) 0.1 % ointment, Apply 1-2 times daily as needed for itch to affected areas. (Patient taking differently: 1 Application as needed. Apply 1-2 times daily as  needed for itch to affected areas.), Disp: 60 g, Rfl: 2   acetaminophen (TYLENOL) 500 MG tablet, Take 1,000 mg by mouth daily at 6 (six) AM. (Patient not taking: Reported on 10/01/2023), Disp: , Rfl:   Imaging Review  Cervical Imaging: Cervical CT wo contrast: Results for orders placed during the hospital encounter of 08/30/20 CT Cervical Spine Wo Contrast  Narrative CLINICAL DATA:  71 year old female status post fall 2 days ago.  EXAM: CT CERVICAL SPINE WITHOUT CONTRAST  TECHNIQUE:  Multidetector CT imaging of the cervical spine was performed without intravenous contrast. Multiplanar CT image reconstructions were also generated.  COMPARISON:  Head CT today reported separately.  FINDINGS: Alignment: Preserved cervical lordosis. Cervicothoracic junction alignment is within normal limits. Bilateral posterior element alignment is within normal limits.  Skull base and vertebrae: Visualized skull base is intact. No atlanto-occipital dissociation. No acute osseous abnormality identified.  Soft tissues and spinal canal: No prevertebral fluid or swelling. No visible canal hematoma. Partially retropharyngeal course of the right carotid. Calcified carotid atherosclerosis greater on the left. Otherwise negative visible noncontrast neck soft tissues.  Disc levels: Intermittent cervical disc and endplate degeneration but capacious spinal canal.  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 Otherwise negative.  IMPRESSION: 1. No acute traumatic injury identified in the cervical spine. 2. Aberrant origin of the right subclavian artery and retropharyngeal course of the right carotid, normal variants.   Electronically Signed By: Odessa Fleming M.D. On: 08/30/2020 14:11  Shoulder Imaging: Shoulder-R MR wo contrast: Results for orders placed during the hospital encounter of 06/11/15 MR Shoulder Right Wo Contrast  Narrative CLINICAL DATA:  Right shoulder pain  and weakness.  Recent fall.  EXAM: MRI OF THE RIGHT SHOULDER WITHOUT CONTRAST  TECHNIQUE: Multiplanar, multisequence MR imaging of the shoulder was performed. No intravenous contrast was administered.  COMPARISON:  05/27/2015  FINDINGS: Rotator cuff: There is a large full-thickness retracted tear of the supraspinous and infraspinatus tendons. The defect is approximately 4 x 5 cm. The tendons are retracted with a prominent intrasubstance tear at the musculotendinous junction of the infraspinatus. Teres minor tendon is intact. Shallow partial-thickness articular surface tear of the distal subscapularis tendon.  Muscles: Slight atrophy of the supraspinous and infraspinatus muscles.  Biceps long head:  Properly located and intact.  Acromioclavicular Joint: Hypertrophy of the acromioclavicular joint with a prominent anterior osteophyte on the acromion with a type 3 configuration which could predispose to impingement.  Glenohumeral Joint: Prominent glenohumeral joint effusion.  Labrum:  Intact.  Bones:  Normal.  IMPRESSION: 1. Large full-thickness retracted tears of the infraspinatus and supraspinous tendons with slight atrophy of those muscles. Partial-thickness articular surface tear of the distal subscapularis tendon. 2. Prominent glenohumeral joint effusion. 3. Prominent anterior osteophyte on the type 3 acromion which could predispose to impingement.   Electronically Signed By: Francene Boyers M.D. On: 06/11/2015 14:08  Shoulder-L MR wo contrast: Results for orders placed during the hospital encounter of 06/30/22 MR SHOULDER LEFT WO CONTRAST  Narrative CLINICAL DATA:  Two recent falls. Persistent left shoulder pain and limited range of motion.  EXAM: MRI OF THE LEFT SHOULDER WITHOUT CONTRAST  TECHNIQUE: Multiplanar, multisequence MR imaging of the shoulder was performed. No intravenous contrast was administered.  COMPARISON:  None  Available.  FINDINGS: Rotator cuff: Large full-thickness retracted rotator cuff tear. The supraspinatus, infraspinatus and subscapularis tendons are torn and retracted. The supraspinatus tendon is retracted approximately 2.7 cm and the infraspinatus is retracted approximately 3.5 cm.  Muscles: Associated fatty atrophy of the supraspinatus and infraspinatus muscles. There is also moderate fluid and edema surrounding the muscles.  Biceps long head:  Intact  Acromioclavicular Joint: Mild degenerative changes. Type 2 acromion. Mild lateral downsloping and moderate subacromial spurring. Marked narrowing of the humeroacromial space due to the full-thickness rotator cuff tear.  Glenohumeral Joint: Moderate degenerative changes and moderate-sized joint effusion with synovitis.  Labrum:  Labral degenerative changes. No obvious labral tears.  Bones:  No acute bony findings.  Other: Expected fluid in the subacromial/subdeltoid bursa.  IMPRESSION: 1. Large full-thickness retracted rotator cuff tear as described above. 2. Intact long head biceps tendon and degenerated glenoid labrum. 3. Moderate lateral downsloping the acromion and moderate subacromial spurring. 4. Moderate glenohumeral joint degenerative changes with moderate-sized joint effusion and synovitis.   Electronically Signed By: Rudie Meyer M.D. On: 06/30/2022 13:13  Shoulder-R DG: Results for orders placed during the hospital encounter of 05/27/15 DG Shoulder Right  Narrative CLINICAL DATA:  Pain following fall  EXAM: RIGHT SHOULDER - 2+ VIEW  COMPARISON:  None.  FINDINGS: Frontal, Y scapular, and axillary images obtained. There is no fracture or dislocation. There is osteoarthritic change in the acromioclavicular and glenohumeral joint regions. There is bony overgrowth along the inferior acromion. No erosive change.  IMPRESSION: Bony overgrowth along the inferior acromion. Underlying  generalized osteoarthritic change. No fracture or dislocation.  Bony overgrowth along the inferior acromion may place patient at risk for so-called impingement syndrome.   Electronically Signed By: Bretta Bang III M.D. On: 05/27/2015 09:08  Hip Imaging: Hip-R DG 2-3 views: Results for orders placed in visit on 01/28/21 DG HIP UNILAT W OR W/O PELVIS 2-3 VIEWS RIGHT  Narrative CLINICAL DATA:  Right hip pain x2 weeks  EXAM: DG HIP (WITH OR WITHOUT PELVIS) 2-3V RIGHT  COMPARISON:  None.  FINDINGS: No fracture or dislocation is seen.  Mild degenerative changes of the bilateral hips.  Visualized bony pelvis appears intact.  Degenerative changes of the lower lumbar spine.  IMPRESSION: Mild degenerative changes of the bilateral hips.   Electronically Signed By: Charline Bills M.D. On: 01/29/2021 10:39  Knee Imaging: Knee-R DG 4 views: Results for orders placed in visit on 06/18/18 DG Knee Complete 4 Views Right  Narrative CLINICAL DATA:  Medial sided right knee pain.  No known injury.  EXAM: RIGHT KNEE - COMPLETE 4+ VIEW  COMPARISON:  None.  FINDINGS: No fracture or dislocation. Moderate tricompartmental degenerative change of the knee, worse with the medial compartment with joint space loss, subchondral sclerosis and osteophytosis. There is minimal spurring the tibial spines. No evidence of chondrocalcinosis. Small knee joint effusion. Enthesopathic change involving the superior and inferior poles of the patella.  IMPRESSION: 1. Small knee joint effusion.  Otherwise, no acute findings. 2. Moderate tricompartmental degenerative change of the knee, worse within the medial compartment.   Electronically Signed By: Simonne Come M.D. On: 06/18/2018 09:01  Foot Imaging: Foot-L DG Complete: Results for orders placed in visit on 06/15/14 DG Foot Complete Left  Narrative 3 views of the left foot demonstrates an osseously mature foot large posterior  and plantar calcaneal heel spurs. Some soft tissue increase in density at the plantar fascial calcaneal insertion site. Dorsal spurring and loss of joint space Lisfranc's joints   Complexity Note: Imaging results reviewed.                         ROS  Cardiovascular: Daily Aspirin intake, High blood pressure, and Blood thinners:  Antiplatelet Pulmonary or Respiratory: No reported pulmonary signs or symptoms such as wheezing and difficulty taking a deep full breath (Asthma), difficulty blowing air out (Emphysema), coughing up mucus (Bronchitis), persistent dry cough, or temporary stoppage of breathing during sleep Neurological: Stroke (Residual deficits or weakness:  ) Psychological-Psychiatric: No reported psychological or psychiatric signs or symptoms such as difficulty sleeping, anxiety, depression, delusions or hallucinations (schizophrenial), mood swings (bipolar disorders) or suicidal ideations or attempts Gastrointestinal: Reflux  or heatburn Genitourinary: No reported renal or genitourinary signs or symptoms such as difficulty voiding or producing urine, peeing blood, non-functioning kidney, kidney stones, difficulty emptying the bladder, difficulty controlling the flow of urine, or chronic kidney disease Hematological: No reported hematological signs or symptoms such as prolonged bleeding, low or poor functioning platelets, bruising or bleeding easily, hereditary bleeding problems, low energy levels due to low hemoglobin or being anemic Endocrine: Slow thyroid Rheumatologic: Joint aches and or swelling due to excess weight (Osteoarthritis) Musculoskeletal: Negative for myasthenia gravis, muscular dystrophy, multiple sclerosis or malignant hyperthermia Work History: Retired  Allergies  Bailey Brooks is allergic to morphine and codeine, sulfa antibiotics, iodine, adhesive [tape], erythromycin, and oxycodone.  Laboratory Chemistry Profile   Renal Lab Results  Component Value Date   BUN 24  (H) 08/24/2023   CREATININE 0.89 08/24/2023   GFR 64.25 08/24/2023   GFRAA >60 07/05/2020   GFRNONAA 44 (L) 06/05/2023   SPECGRAV >=1.030 03/20/2013   PHUR 5.5 03/20/2013   PROTEINUR 30 (A) 06/05/2023     Electrolytes Lab Results  Component Value Date   NA 139 08/24/2023   K 4.2 08/24/2023   CL 104 08/24/2023   CALCIUM 9.6 08/24/2023   MG 1.9 06/12/2018     Hepatic Lab Results  Component Value Date   AST 27 11/06/2022   ALT 22 11/06/2022   ALBUMIN 3.8 11/06/2022   ALKPHOS 94 11/06/2022   LIPASE 93 09/03/2013     ID Lab Results  Component Value Date   SARSCOV2NAA NEGATIVE 10/13/2019   STAPHAUREUS NEGATIVE 11/06/2022   MRSAPCR NEGATIVE 11/06/2022     Bone Lab Results  Component Value Date   VD25OH 59.67 11/23/2014     Endocrine Lab Results  Component Value Date   GLUCOSE 114 (H) 08/24/2023   GLUCOSEU NEGATIVE 06/05/2023   HGBA1C 5.8 (A) 01/26/2023   TSH 1.11 08/24/2023   FREET4 0.94 05/25/2015     Neuropathy Lab Results  Component Value Date   VITAMINB12 589 08/02/2020   HGBA1C 5.8 (A) 01/26/2023     CNS No results found for: "COLORCSF", "APPEARCSF", "RBCCOUNTCSF", "WBCCSF", "POLYSCSF", "LYMPHSCSF", "EOSCSF", "PROTEINCSF", "GLUCCSF", "JCVIRUS", "CSFOLI", "IGGCSF", "LABACHR", "ACETBL"   Inflammation (CRP: Acute  ESR: Chronic) No results found for: "CRP", "ESRSEDRATE", "LATICACIDVEN"   Rheumatology No results found for: "RF", "ANA", "LABURIC", "URICUR", "LYMEIGGIGMAB", "LYMEABIGMQN", "HLAB27"   Coagulation Lab Results  Component Value Date   INR 1.0 05/05/2022   LABPROT 12.6 05/05/2022   APTT 27 05/05/2022   PLT 214 06/05/2023     Cardiovascular Lab Results  Component Value Date   BNP 54.6 01/01/2017   HGB 12.8 06/05/2023   HCT 40.2 06/05/2023     Screening Lab Results  Component Value Date   SARSCOV2NAA NEGATIVE 10/13/2019   STAPHAUREUS NEGATIVE 11/06/2022   MRSAPCR NEGATIVE 11/06/2022     Cancer Lab Results  Component Value Date    CEA 0.7 10/14/2010     Allergens No results found for: "ALMOND", "APPLE", "ASPARAGUS", "AVOCADO", "BANANA", "BARLEY", "BASIL", "BAYLEAF", "GREENBEAN", "LIMABEAN", "WHITEBEAN", "BEEFIGE", "REDBEET", "BLUEBERRY", "BROCCOLI", "CABBAGE", "MELON", "CARROT", "CASEIN", "CASHEWNUT", "CAULIFLOWER", "CELERY"     Note: Lab results reviewed.  PFSH  Drug: Bailey Brooks  reports no history of drug use. Alcohol:  reports no history of alcohol use. Tobacco:  reports that she has never smoked. She has never used smokeless tobacco. 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 Family: family history includes Breast cancer (age of onset: 24) in her sister; Cancer in her maternal aunt; Diabetes in her mother; Hearing loss in her maternal aunt and maternal grandfather; Heart attack in her mother; Heart disease (age of onset: 54) in her brother; Hypertension in her mother; Kidney cancer (age of onset: 43) in her niece; Kidney disease in her sister; Melanoma (age of onset: 62) in her father; Other in her maternal grandmother; Prostate cancer in her brother.  Past Surgical History:  Procedure Laterality Date   ABDOMINAL  HYSTERECTOMY  1985   Hysterectomy-partial   BREAST BIOPSY Right 2002   Neg - AT [facility]   BREAST BIOPSY Right 2020   bx done at [facility]?, IDC and DCIS   BREAST LUMPECTOMY Right 10/16/2019   IDC and DCIS, negative LN   BURR HOLE W/ PLACEMENT OMMAYA RESERVOIR     BURR HOLE W/ PLACEMENT OMMAYA RESERVOIR  2012   COLONOSCOPY  04/2013   ESOPHAGOGASTRODUODENOSCOPY  04/2013   INJECTION KNEE  07/02/2018   INSERTION CENTRAL VENOUS ACCESS DEVICE W/ SUBCUTANEOUS PORT  2011   Port a Cath: Right chest Double Lumen, 04-Nov-2010   LIMBAL STEM CELL TRANSPLANT  2012   LIVER BIOPSY     stage 4B large Bcell lymphoma   PARTIAL MASTECTOMY WITH NEEDLE LOCALIZATION AND AXILLARY SENTINEL LYMPH NODE BX Right 10/16/2019   Procedure: PARTIAL MASTECTOMY WITH NEEDLE LOCALIZATION AND AXILLARY SENTINEL LYMPH NODE BX;  Surgeon: Sung Amabile, DO;  Location: [facility];  Service: General;  Laterality: Right;   REVERSE SHOULDER ARTHROPLASTY Left 11/14/2022   Procedure: REVERSE SHOULDER ARTHROPLASTY WITH BICEPS TENODESIS.;  Surgeon: Christena Flake, MD;  Location: [facility];  Service: Orthopedics;  Laterality: Left;   ROTATOR CUFF REPAIR Right 2016   SHOULDER ARTHROSCOPY Right 06/22/2015   Procedure: ARTHROSCOPY SHOULDER, parital repair of rotator cuff, biceps tenodesis, decompression and debridement;  Surgeon: Christena Flake, MD;  Location: [facility];  Service: Orthopedics;  Laterality: Right;   TONSILLECTOMY  1954   VULVECTOMY     VULVECTOMY PARTIAL N/A 09/29/2015   Procedure: VULVECTOMY PARTIAL;  Surgeon: Artelia Laroche, MD;  Location: [facility];  Service: Gynecology;  Laterality: N/A;   Active Ambulatory Problems    Diagnosis Date Noted   Hypothyroidism 02/20/2012   HLD (hyperlipidemia) 02/20/2012   Muscle cramps 11/13/2012   Eczema 02/27/2013   Paget disease, 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   Adjustment disorder with mixed anxiety and depressed mood 05/25/2015   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   Hypomagnesemia  03/02/2016   Morbid obesity due to excess calories (HCC) 03/02/2016   Essential hypertension 07/06/2016   Chronic hip pain (3ry area of Pain) (Right) 08/03/2016   History of transient ischemic attack (TIA) 02/10/2017   Hypokalemia 02/10/2017   Chronic low back pain (1ry area of Pain) (Bilateral) (R>L) w/o sciatica 04/03/2017   Vulvar lesion 05/16/2017   History of lymphoma 06/11/2017   Bruising 12/14/2017   Macular degeneration 06/20/2018   GERD (gastroesophageal reflux disease) 06/20/2018   Hypersomnia 06/20/2018   Chronic  knee pain (4th area of Pain) (Bilateral) (R>L) 06/20/2018   Prediabetes 12/20/2018   Pain due to onychomycosis of toenails of both feet 06/09/2019   Carcinoma of upper-outer quadrant of right breast in female, estrogen receptor positive (HCC) 10/28/2019   Family history of breast cancer    Family history of prostate cancer    Family history of melanoma    Genetic testing 03/23/2020   Fatigue 03/24/2020   Low serum vitamin B12 05/28/2020   Syncope 09/03/2020   Yeast vaginitis 02/06/2022   TIA (transient ischemic attack) 04/02/2022   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 04/24/2022   Lumbar spondylosis 04/24/2022   Rotator cuff tear 12/09/2021   Eye abnormality 10/30/2022   Status post reverse arthroplasty of shoulder, left 11/14/2022   Albuminuria 08/24/2023   Nail dystrophy 08/24/2023   Rotator cuff arthropathy, left 11/17/2022   Chronic pain syndrome 10/01/2023   Pharmacologic therapy 10/01/2023   Disorder of skeletal system 10/01/2023   Problems influencing health status 10/01/2023   Chronic groin pain  (2ry area of Pain) (Right) 10/01/2023   History of breast cancer 10/01/2023   Chronic groin pain (Left) 10/01/2023   Spinal stenosis, lumbar region, with neurogenic claudication 10/01/2023   Chronic shoulder pain (5th area of Pain) (Bilateral) 10/01/2023   Resolved Ambulatory Problems    Diagnosis Date Noted    Personal history of lymphoma 02/20/2012   Obesity 02/20/2012   Acute maxillary sinusitis 05/21/2012   Acute frontal sinusitis 10/14/2012   Screening for breast cancer 11/13/2012   Routine general medical examination at a health care facility 02/27/2013   Vaginal candidiasis 02/27/2013   Vaginal lesion 03/20/2013   Left foot pain 05/19/2014   Toenail deformity 05/19/2014   Elevated BP 05/25/2015   Knee pain, right 05/25/2015   Candidiasis of skin 09/07/2015   Maxillary sinusitis 09/07/2015   Paget's disease of vulva (HCC)    Complete rotator cuff rupture of left shoulder 06/21/2015   Injury of tendon of upper extremity 06/24/2015   Duodenitis 05/21/2014   Esophagitis 05/21/2014   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   Recurrent candidiasis of vagina 03/02/2016   Left knee pain 06/28/2016   Exertional shortness of breath 11/06/2016   TIA (transient ischemic attack) 05/15/2017   Diffuse large B cell lymphoma (HCC) 06/11/2017   History of TIA (transient ischemic attack) 09/13/2017   Stress 08/06/2019   Breast cancer screening 08/06/2019   Head injury 09/03/2020   Bruit of left carotid artery 01/28/2021   Nausea 07/29/2021   Fall 01/03/2022   Left shoulder pain 01/03/2022   Upper respiratory infection 01/11/2022   Abnormal urinalysis 01/11/2022   Contusion of left hip 12/09/2021   Past Medical History:  Diagnosis Date   Adult pulmonary Langerhans cell histiocytosis (HCC)    Allergic rhinitis    Antineoplastic chemotherapy induced anemia    Arthritis    Breast cancer, right (HCC) 09/17/2019   Complication of anesthesia    Diastolic dysfunction    Diverticulosis    H/O stem cell transplant (HCC) 06/2011   Hemorrhoids    History of stress test 05/19/2010   Hypercholesterolemia    Hypertension    Long term current use of aromatase inhibitor    Morbid obesity (HCC)    Ommaya reservoir present 2012   Pre-diabetes    Tubular adenoma of colon    Constitutional Exam  General  appearance: Well nourished, well developed, and well hydrated. In no apparent acute distress Vitals:   10/01/23  1117 10/01/23 1121  BP:  (!) 149/79  Pulse: 89 67  Resp: 18   Temp: (!) 97.2 F (36.2 C) (!) 97.3 F (36.3 C)  SpO2: 97%   Weight:  240 lb (108.9 kg)  Height:  5\' 3"  (1.6 m)   BMI Assessment: Estimated body mass index is 42.51 kg/m as calculated from the following:   Height as of this encounter: 5\' 3"  (1.6 m).   Weight as of this encounter: 240 lb (108.9 kg).  BMI interpretation table: BMI level Category Range association with higher incidence of chronic pain  <18 kg/m2 Underweight   18.5-24.9 kg/m2 Ideal body weight   25-29.9 kg/m2 Overweight Increased incidence by 20%  30-34.9 kg/m2 Obese (Class I) Increased incidence by 68%  35-39.9 kg/m2 Severe obesity (Class II) Increased incidence by 136%  >40 kg/m2 Extreme obesity (Class III) Increased incidence by 254%   Patient's current BMI Ideal Body weight  Body mass index is 42.51 kg/m. Ideal body weight: 52.4 kg (115 lb 8.3 oz) Adjusted ideal body weight: 75 kg (165 lb 5 oz)   BMI Readings from Last 4 Encounters:  10/01/23 42.51 kg/m  08/24/23 35.71 kg/m  07/26/23 42.94 kg/m  06/05/23 42.34 kg/m   Wt Readings from Last 4 Encounters:  10/01/23 240 lb (108.9 kg)  08/24/23 201 lb 9.6 oz (91.4 kg)  07/26/23 242 lb 6.4 oz (110 kg)  06/05/23 239 lb (108.4 kg)    Psych/Mental status: Alert, oriented x 3 (person, place, & time)       Eyes: PERLA Respiratory: No evidence of acute respiratory distress  Assessment  Primary Diagnosis & Pertinent Problem List: The primary encounter diagnosis was Chronic pain syndrome. Diagnoses of Chronic low back pain (1ry area of Pain) (Bilateral) (R>L) w/o sciatica, Spinal stenosis, lumbar region, with neurogenic claudication, Chronic groin pain  (2ry area of Pain) (Right), Chronic groin pain (Left), Chronic hip pain (3ry area of Pain) (Right), Chronic knee pain (4th area of Pain)  (Bilateral) (R>L), Chronic shoulder pain (5th area of Pain) (Bilateral), History of breast cancer, History of lymphoma, Pharmacologic therapy, Disorder of skeletal system, and Problems influencing health status were also pertinent to this visit.  Visit Diagnosis (New problems to examiner): 1. Chronic pain syndrome   2. Chronic low back pain (1ry area of Pain) (Bilateral) (R>L) w/o sciatica   3. Spinal stenosis, lumbar region, with neurogenic claudication   4. Chronic groin pain  (2ry area of Pain) (Right)   5. Chronic groin pain (Left)   6. Chronic hip pain (3ry area of Pain) (Right)   7. Chronic knee pain (4th area of Pain) (Bilateral) (R>L)   8. Chronic shoulder pain (5th area of Pain) (Bilateral)   9. History of breast cancer   10. History of lymphoma   11. Pharmacologic therapy   12. Disorder of skeletal system   13. Problems influencing health status    Plan of Care (Initial workup plan)  Note: Bailey Brooks was reminded that as per protocol, today's visit has been an evaluation only. We have not taken over the patient's controlled substance management.  Problem-specific plan: No problem-specific Assessment & Plan notes found for this encounter.  Lab Orders  No laboratory test(s) ordered today   Imaging Orders         DG Lumbar Spine Complete W/Bend     Referral Orders  No referral(s) requested today   Procedure Orders    No procedure(s) ordered today   Pharmacotherapy (current): Medications ordered:  No  orders of the defined types were placed in this encounter.  Medications administered during this visit: Bailey Brooks had no medications administered during this visit.   Analgesic Pharmacotherapy:  Opioid Analgesics: For patients currently taking or requesting to take opioid analgesics, in accordance with [facility] Guidelines, we will assess their risks and indications for the use of these substances. After completing our evaluation, we may offer  recommendations, but we no longer take patients for medication management. The prescribing physician will ultimately decide, based on his/her training and level of comfort whether to adopt any of the recommendations, including whether or not to prescribe such medicines.  Membrane stabilizer: To be determined at a later time  Muscle relaxant: To be determined at a later time  NSAID: To be determined at a later time  Other analgesic(s): To be determined at a later time   Interventional management options: Bailey Brooks was informed that there is no guarantee that she would be a candidate for interventional therapies. The decision will be based on the results of diagnostic studies, as well as Bailey Brooks's risk profile.  Procedure(s) under consideration:  Pending results of ordered studies      Interventional Therapies  Risk Factors  Considerations  Medical Comorbidities:  History of breast cancer and lymphoma     Planned  Pending:   Diagnostic Lumbar spine MRI    Under consideration:   Diagnostic/therapeutic midline L4-5 LESI #1  Diagnostic/therapeutic bilateral IA hip joint injection #1    Completed:   None at this time   Therapeutic  Palliative (PRN) options:   None established   Completed by other providers:   None reported       Provider-requested follow-up: Return in about 2 weeks (around 10/15/2023) for ( ), Eval-day (M,W), (F2F), 2nd Visit, for review of ordered tests.  Future Appointments  Date Time Provider Department Center  10/09/2023  2:30 PM Willeen Niece, MD [facility] None  10/10/2023  9:00 AM [facility] None  10/24/2023  8:20 AM Delano Metz, MD [facility] None  12/07/2023 10:45 AM Freddie Breech, DPM [facility]  02/11/2024  9:00 AM [facility]  02/22/2024  9:00 AM Dana Allan, MD [facility]  02/22/2024 10:30 AM [facility] None  02/22/2024 10:45 AM Earna Coder, MD [facility] None     Duration of encounter: 50 minutes.  Total time on encounter, as per AMA guidelines included both the face-to-face and non-face-to-face time personally spent by the physician and/or other qualified health care professional(s) on the day of the encounter (includes time in activities that require the physician or other qualified health care professional and does not include time in activities normally performed by clinical staff). Physician's time may include the following activities when performed: Preparing to see the patient (e.g., pre-charting review of records, searching for previously ordered imaging, lab work, and nerve conduction tests) Review of prior analgesic pharmacotherapies. Reviewing PMP Interpreting ordered tests (e.g., lab work, imaging, nerve conduction tests) Performing post-procedure evaluations, including interpretation of diagnostic procedures Obtaining and/or reviewing separately obtained history Performing a medically appropriate examination and/or evaluation Counseling and educating the patient/family/caregiver Ordering medications, tests, or procedures Referring and communicating with other health care professionals (when not separately reported) Documenting clinical information in the electronic or other health record Independently interpreting results (not separately reported) and communicating results to the patient/ family/caregiver Care coordination (not separately reported)  Note by: Oswaldo Done, MD (TTS technology used. I apologize for  any typographical errors that were not detected and corrected.) Date: 10/01/2023; Time: 7:29 AM

## 2023-09-29 ENCOUNTER — Other Ambulatory Visit: Payer: Self-pay | Admitting: Family Medicine

## 2023-09-29 DIAGNOSIS — E785 Hyperlipidemia, unspecified: Secondary | ICD-10-CM

## 2023-10-01 ENCOUNTER — Ambulatory Visit
Admission: RE | Admit: 2023-10-01 | Discharge: 2023-10-01 | Disposition: A | Discharge status: Home / Self Care | Payer: Medicare Other | Source: Ambulatory Visit | Attending: Pain Medicine | Admitting: Pain Medicine

## 2023-10-01 ENCOUNTER — Encounter: Payer: Self-pay | Admitting: Pain Medicine

## 2023-10-01 ENCOUNTER — Ambulatory Visit: Payer: Medicare Other | Admitting: Pain Medicine

## 2023-10-01 VITALS — BP 149/79 | HR 67 | Temp 97.3°F | Resp 18 | Ht 63.0 in | Wt 240.0 lb

## 2023-10-01 DIAGNOSIS — M899 Disorder of bone, unspecified: Secondary | ICD-10-CM

## 2023-10-01 DIAGNOSIS — M48062 Spinal stenosis, lumbar region with neurogenic claudication: Secondary | ICD-10-CM | POA: Insufficient documentation

## 2023-10-01 DIAGNOSIS — G894 Chronic pain syndrome: Secondary | ICD-10-CM | POA: Insufficient documentation

## 2023-10-01 DIAGNOSIS — Z853 Personal history of malignant neoplasm of breast: Secondary | ICD-10-CM | POA: Insufficient documentation

## 2023-10-01 DIAGNOSIS — Z789 Other specified health status: Secondary | ICD-10-CM | POA: Insufficient documentation

## 2023-10-01 DIAGNOSIS — Z79899 Other long term (current) drug therapy: Secondary | ICD-10-CM | POA: Insufficient documentation

## 2023-10-01 DIAGNOSIS — M25551 Pain in right hip: Secondary | ICD-10-CM

## 2023-10-01 DIAGNOSIS — M25562 Pain in left knee: Secondary | ICD-10-CM | POA: Insufficient documentation

## 2023-10-01 DIAGNOSIS — Z8572 Personal history of non-Hodgkin lymphomas: Secondary | ICD-10-CM | POA: Insufficient documentation

## 2023-10-01 DIAGNOSIS — M25561 Pain in right knee: Secondary | ICD-10-CM | POA: Insufficient documentation

## 2023-10-01 DIAGNOSIS — M25512 Pain in left shoulder: Secondary | ICD-10-CM

## 2023-10-01 DIAGNOSIS — G8929 Other chronic pain: Secondary | ICD-10-CM | POA: Insufficient documentation

## 2023-10-01 DIAGNOSIS — M47816 Spondylosis without myelopathy or radiculopathy, lumbar region: Secondary | ICD-10-CM | POA: Diagnosis not present

## 2023-10-01 DIAGNOSIS — R1032 Left lower quadrant pain: Secondary | ICD-10-CM | POA: Insufficient documentation

## 2023-10-01 DIAGNOSIS — M25511 Pain in right shoulder: Secondary | ICD-10-CM

## 2023-10-01 DIAGNOSIS — R1031 Right lower quadrant pain: Secondary | ICD-10-CM | POA: Insufficient documentation

## 2023-10-01 DIAGNOSIS — M545 Low back pain, unspecified: Secondary | ICD-10-CM | POA: Insufficient documentation

## 2023-10-01 HISTORY — DX: Personal history of malignant neoplasm of breast: Z85.3

## 2023-10-01 NOTE — Progress Notes (Unsigned)
Safety precautions to be maintained throughout the outpatient stay will include: orient to surroundings, keep bed in low position, maintain call bell within reach at all times, provide assistance with transfer out of bed and ambulation.  

## 2023-10-08 ENCOUNTER — Encounter: Payer: Self-pay | Admitting: Nurse Practitioner

## 2023-10-09 ENCOUNTER — Ambulatory Visit: Payer: Medicare Other | Admitting: Dermatology

## 2023-10-09 DIAGNOSIS — L304 Erythema intertrigo: Secondary | ICD-10-CM

## 2023-10-09 DIAGNOSIS — L300 Nummular dermatitis: Secondary | ICD-10-CM

## 2023-10-09 DIAGNOSIS — I781 Nevus, non-neoplastic: Secondary | ICD-10-CM

## 2023-10-09 DIAGNOSIS — D225 Melanocytic nevi of trunk: Secondary | ICD-10-CM | POA: Diagnosis not present

## 2023-10-09 DIAGNOSIS — L821 Other seborrheic keratosis: Secondary | ICD-10-CM

## 2023-10-09 DIAGNOSIS — L820 Inflamed seborrheic keratosis: Secondary | ICD-10-CM | POA: Diagnosis not present

## 2023-10-09 DIAGNOSIS — L817 Pigmented purpuric dermatosis: Secondary | ICD-10-CM

## 2023-10-09 DIAGNOSIS — D239 Other benign neoplasm of skin, unspecified: Secondary | ICD-10-CM

## 2023-10-09 DIAGNOSIS — Z1283 Encounter for screening for malignant neoplasm of skin: Secondary | ICD-10-CM | POA: Diagnosis not present

## 2023-10-09 DIAGNOSIS — L814 Other melanin hyperpigmentation: Secondary | ICD-10-CM

## 2023-10-09 DIAGNOSIS — D1801 Hemangioma of skin and subcutaneous tissue: Secondary | ICD-10-CM

## 2023-10-09 DIAGNOSIS — D2239 Melanocytic nevi of other parts of face: Secondary | ICD-10-CM | POA: Diagnosis not present

## 2023-10-09 DIAGNOSIS — D692 Other nonthrombocytopenic purpura: Secondary | ICD-10-CM

## 2023-10-09 DIAGNOSIS — D2371 Other benign neoplasm of skin of right lower limb, including hip: Secondary | ICD-10-CM

## 2023-10-09 DIAGNOSIS — W908XXA Exposure to other nonionizing radiation, initial encounter: Secondary | ICD-10-CM

## 2023-10-09 DIAGNOSIS — D2262 Melanocytic nevi of left upper limb, including shoulder: Secondary | ICD-10-CM

## 2023-10-09 DIAGNOSIS — L578 Other skin changes due to chronic exposure to nonionizing radiation: Secondary | ICD-10-CM | POA: Diagnosis not present

## 2023-10-09 DIAGNOSIS — L918 Other hypertrophic disorders of the skin: Secondary | ICD-10-CM | POA: Diagnosis not present

## 2023-10-09 DIAGNOSIS — D229 Melanocytic nevi, unspecified: Secondary | ICD-10-CM

## 2023-10-09 NOTE — Patient Instructions (Addendum)

## 2023-10-09 NOTE — Progress Notes (Signed)
Follow-Up Visit   Subjective  Bailey Brooks is a 72 y.o. female who presents for the following: Skin Cancer Screening and Full Body Skin Exam  The patient presents for Total-Body Skin Exam (TBSE) for skin cancer screening and mole check. The patient has spots, moles and lesions to be evaluated, some may be new or changing. She has a spot on her right forearm that she scratches, noticed 2 weeks ago. Also a spot on her right frontal hairline. No history of skin cancer.     The following portions of the chart were reviewed this encounter and updated as appropriate: medications, allergies, medical history  Review of Systems:  No other skin or systemic complaints except as noted in HPI or Assessment and Plan.  Objective  Well appearing patient in no apparent distress; mood and affect are within normal limits.  A full examination was performed including scalp, head, eyes, ears, nose, lips, neck, chest, axillae, abdomen, back, buttocks, bilateral upper extremities, bilateral lower extremities, hands, feet, fingers, toes, fingernails, and toenails. All findings within normal limits unless otherwise noted below.   Relevant physical exam findings are noted in the Assessment and Plan.  Right Forearm x 1 Erythematous stuck-on, waxy papule  lower legs at medial ankles Cayenne-pepper macules and yellow orange hyperpigmentation.    Assessment & Plan   SKIN CANCER SCREENING PERFORMED TODAY.  ACTINIC DAMAGE - Chronic condition, secondary to cumulative UV/sun exposure - diffuse scaly erythematous macules with underlying dyspigmentation - Recommend daily broad spectrum sunscreen SPF 30+ to sun-exposed areas, reapply every 2 hours as needed.  - Staying in the shade or wearing long sleeves, sun glasses (UVA+UVB protection) and wide brim hats (4-inch brim around the entire circumference of the hat) are also recommended for sun protection.  - Call for new or changing lesions.  LENTIGINES,  HEMANGIOMAS - Benign normal skin lesions - Benign-appearing - Call for any changes  SEBORRHEIC KERATOSIS - Stuck-on, waxy, tan-brown papules, including R frontal hairline - Benign-appearing - Discussed benign etiology and prognosis. - Observe - Call for any changes  MELANOCYTIC NEVI - Tan-brown and/or pink-flesh-colored symmetric macules and papules - Left Upper Back 5.0 x 3.0 mm speckled brown macule   - Right Lower Back 6 x 3 mm regular brown macule   - Left palm 5 mm flesh papule  - Benign appearing on exam today - Observation - Call clinic for new or changing moles - Recommend daily use of broad spectrum spf 30+ sunscreen to sun-exposed areas.   Purpura - Chronic; persistent and recurrent.  Treatable, but not curable. - Violaceous macules and patches - Benign - Related to trauma, age, sun damage and/or use of blood thinners, chronic use of topical and/or oral steroids - Observe - Can use OTC arnica containing moisturizer such as Dermend Bruise Formula if desired - Call for worsening or other concerns  Acrochordons (Skin Tags) - Fleshy, skin-colored pedunculated papules - Benign appearing.  - Observe. - If desired, they can be removed with an in office procedure that is not covered by insurance. - Please call the clinic if you notice any new or changing lesions.  Inflamed seborrheic keratosis Right Forearm x 1  Symptomatic, irritating, patient would like treated.  Destruction of lesion - Right Forearm x 1  Destruction method: cryotherapy   Informed consent: discussed and consent obtained   Lesion destroyed using liquid nitrogen: Yes   Region frozen until ice ball extended beyond lesion: Yes   Outcome: patient tolerated procedure well with  no complications   Post-procedure details: wound care instructions given   Additional details:  Prior to procedure, discussed risks of blister formation, small wound, skin dyspigmentation, or rare scar following cryotherapy.  Recommend Vaseline ointment to treated areas while healing.   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 Observation.  Recommend daily compression hose  Mometasone cream every day/bid prn flares  Intertrigo  Nummular dermatitis  Fibrous Papule - left nasal tip 2.5 mm firm flesh papule  - Benign-appearing.  Observation.  Call clinic for new or changing lesions.   Dermatofibroma  - Right medial lower thigh 6 mm pink brown firm papule with darker edge  - Right lower knee firm pink/brown papulenodule with dimple sign.  - Benign appearing - Call for any changes  TELANGIECTASIA Exam: dilated blood vessels on the cheeks  Treatment Plan: Benign appearing on exam Call for changes  NUMMULAR DERMATITIS  Exam: Lichenified pink patch of the right wrist.  Chronic and persistent condition with duration or expected duration over one year. Condition is improving with treatment but not currently at goal.   Treatment Plan: Continue Tacrolimus ointment once to twice daily as needed. Pt has at home.  Continue mometasone cream once to twice daily to aa as needed for flares. Avoid applying to face, groin, and axilla. Use as directed. Long-term use can cause thinning of the skin. Pt has at home.   Erythema intertrigo bilateral inguinal crease   Chronic condition with duration or expected duration over one year. Currently well-controlled.     Intertrigo is a chronic recurrent rash that occurs in skin fold areas that may be associated with friction; heat; moisture; yeast; fungus; and bacteria.  It is exacerbated by increased movement / activity; sweating; and higher atmospheric temperature.   Continue Aquaphor daily as skin protectant Ketoconazole 2% cream Apply BID prn pink bumps/patches. Pt has. Tacrolimus 0.1% ointment Apply BID prn itchy rash. Pt has.  Return in about 1 year (around 10/08/2024) for TBSE.  ICherlyn Labella, CMA, am acting as scribe for Willeen Niece,  MD .   Documentation: I have reviewed the above documentation for accuracy and completeness, and I agree with the above.  Willeen Niece, MD

## 2023-10-10 ENCOUNTER — Inpatient Hospital Stay: Payer: Medicare Other

## 2023-10-10 ENCOUNTER — Ambulatory Visit: Payer: Medicare Other

## 2023-10-16 ENCOUNTER — Other Ambulatory Visit: Payer: Self-pay | Admitting: Internal Medicine

## 2023-10-17 ENCOUNTER — Inpatient Hospital Stay: Payer: Medicare Other | Attending: Internal Medicine | Admitting: Obstetrics and Gynecology

## 2023-10-17 VITALS — BP 156/76 | HR 76 | Temp 98.7°F | Resp 18 | Wt 244.0 lb

## 2023-10-17 DIAGNOSIS — M549 Dorsalgia, unspecified: Secondary | ICD-10-CM | POA: Insufficient documentation

## 2023-10-17 DIAGNOSIS — Z9221 Personal history of antineoplastic chemotherapy: Secondary | ICD-10-CM | POA: Insufficient documentation

## 2023-10-17 DIAGNOSIS — Z923 Personal history of irradiation: Secondary | ICD-10-CM | POA: Diagnosis not present

## 2023-10-17 DIAGNOSIS — Z8544 Personal history of malignant neoplasm of other female genital organs: Secondary | ICD-10-CM | POA: Diagnosis not present

## 2023-10-17 DIAGNOSIS — R1030 Lower abdominal pain, unspecified: Secondary | ICD-10-CM | POA: Insufficient documentation

## 2023-10-17 DIAGNOSIS — Z9079 Acquired absence of other genital organ(s): Secondary | ICD-10-CM | POA: Diagnosis not present

## 2023-10-17 DIAGNOSIS — Z170 Estrogen receptor positive status [ER+]: Secondary | ICD-10-CM | POA: Insufficient documentation

## 2023-10-17 DIAGNOSIS — C4499 Other specified malignant neoplasm of skin, unspecified: Secondary | ICD-10-CM

## 2023-10-17 DIAGNOSIS — C50411 Malignant neoplasm of upper-outer quadrant of right female breast: Secondary | ICD-10-CM | POA: Insufficient documentation

## 2023-10-17 NOTE — Addendum Note (Signed)
Addended by: Ashley Royalty A on: 10/17/2023 02:16 PM   Modules accepted: Orders

## 2023-10-17 NOTE — Progress Notes (Signed)
Gynecologic Oncology Interval Visit   Referring Provider: Dr. Tiburcio Pea  Chief Concern: Recurrent paget's disease of the vulva  Subjective:  Bailey Brooks is a 72 y.o. female, diagnosed with Paget's of the vulva, s/p WLE and vulvar biopsy 09/29/2015, who returns to clinic to re-evaluation.    She is having worsening vulvar irritation on the left.  She is interested in having repeat biopsies today and considering surgical options.    Gynecologic Oncology History: Bailey Brooks has a history of localized vulvar Paget's disease.   04/2013- vulvar biopsy revealed Paget's disease               WLE, additional margins resected for positive disease on frozen evaluation.   Part A: VULVA, VAGINAL MARGIN:  - POSITIVE FOR EXTRAMAMMARY PAGET'S DISEASE  Part B: VULVA, POSTERIOR MARGIN:  - POSITIVE FOR EXTRAMAMMARY PAGET'S DISEASE  Part C: VULVA, RIGHT, PARTIAL VULVECTOMY:  - EXTRAMAMMARY PAGET'S DISEASE  Part D: VULVA, NEW VAGINAL MARGIN:  - POSITIVE FOR EXTRAMAMMARY PAGET'S DISEASE  07/28/2015 for routine surveillance an area on the right vulva seemed more suspicious for recurrence. This was biopsied and confirmed recurrent Paget's disease.   She underwent repeat excision 09/29/15 A. VULVA; EXCISION:  - RARE CYTOKERATIN 7 POSITIVE INTRAEPIDERMAL CELLS, INTERPRETED AS RESIDUAL PAGET DISEASE.  - THE SURGICAL MARGINS ARE CLEAR.   B. VULVA, 11:00; BIOPSY:  - RARE CYTOKERATIN 7 POSITIVE INTRAEPIDERMAL CELLS, INTERPRETED AS PAGET DISEASE.   No additional issues since surgery.   She had vulvar biopsy on 05/09/17 which was negative:   DIAGNOSIS:  A. VULVA; BIOPSY:  - SKIN WITH FOCAL KERATOSIS AND HYPERGRANULOSIS - NEGATIVE FOR DYSPLASIA AND MALIGNANCY  Of note, she has had a h/o recurrent vulvar candidiasis. Prior to her vulvar surgery 09/2015 she received 5 weekly doses of oral diflucan for vulvar candidiasis. Postop she was treated for yeast infection again in 12/17 and then took one  Diflucan a week for six months with good results.   Seen in gyn-onc clinic on 01/16/18 by Dr. Sonia Side. NED at that time.   She has swelling of her lower extremities which was evaluated by her PCP and thought to be related to amlodipine. She started lasix which has improved her symptoms. She was seen by Dr. 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   She saw Dr. Sonia Side on 01/29/2019 with symptoms concerning for candidiasis at that time but no evidence of recurrent disease.   Her blood pressure medications were recently changes due to swelling secondary to amlodipine. No on lisinopril.   Previous biopsies were consistent with recurrent Paget's.   DIAGNOSIS:  A. VULVA, LEFT AT 3:00; PUNCH BIOPSY:  - BENIGN VULVAR TISSUE.  - NEGATIVE FOR DYSPLASIA AND MALIGNANCY.   DIAGNOSIS:  A. VULVA, RIGHT AT 7:00; PUNCH BIOPSY:  - EXTRAMAMMARY PAGET'S DISEASE OF THE VULVA.  - NEGATIVE FOR DYSPLASIA AND MALIGNANCY.   In the interim she has completed surgery and radiation for stage I right breast cancer, ER/PR positive HER-2/neu negative. She completed radiation on 12/29/2019. Anastrozole was recommended and she plans to start in approximately late February.   At last visit we had recommended start Aldara but due to recent diagnosis of breast cancer at that time, patient requested to wait to start treatment and returns today to discuss options for management and consider starting Aldara. She is concerned about reaching site to be treated and tolerability. Her daughter-in-law is going to help her with application.  Previously she had expressed desire to avoid surgery/Mohs.   01/16/20 started Imiquimod  completed 5 weeks of treatment then held 02/18/2020 due to ulceration, erythema, and pain. Restarted on 03/17/2020. We recommended to continue imiquimod for 16 weeks and add clobetasol, 0.05% cream to vulva on alternating days that she does not use imiquimod to  help with inflammation and reduce risk of recurrent ulcer/skin breakdown. Completed treatment on 08/14/2020.   08/14/20 She completed imiquimod treatment. At last appointment, post imiquimod, she underwent biopsy x 2 which were negative for Paget's. We discussed maintenance imiquimod vs monitoring. She elected close surveillance.   08/25/20 DIAGNOSIS:  A. VULVA, RIGHT; BIOPSY:  - BENIGN SQUAMOUS MUCOSA WITH MILD CHRONIC INFLAMMATION.  - NEGATIVE FOR DYSPLASIA AND MALIGNANCY.  - NEGATIVE FOR ACTIVE INFLAMMATION AND FUNGAL ELEMENTS.   B. VULVA, LEFT; BIOPSY:  - BENIGN SQUAMOUS MUCOSA WITH MILD CHRONIC INFLAMMATION.  - NEGATIVE FOR DYSPLASIA AND MALIGNANCY.  - NEGATIVE FOR ACTIVE INFLAMMATION AND FUNGAL ELEMENTS.   Comment:  The history of extramammary Paget's disease is noted. Immunohistochemical stain for CK7 (blocks A1 and B1) is negative.   Patient had vasovagal episode secondary to straining to have BM in September 2021 and was seen in ER. Concerned lidocaine for biopsy could have contributed. She was seen in ER and had negative head ct. Left frontal ventriculoperitoneal shunt was stable on imaging.    Problem List: Patient Active Problem List   Diagnosis Date Noted   Chronic pain syndrome 10/01/2023   Pharmacologic therapy 10/01/2023   Disorder of skeletal system 10/01/2023   Problems influencing health status 10/01/2023   Chronic groin pain  (2ry area of Pain) (Right) 10/01/2023   History of breast cancer 10/01/2023   Chronic groin pain (Left) 10/01/2023   Spinal stenosis, lumbar region, with neurogenic claudication 10/01/2023   Chronic shoulder pain (5th area of Pain) (Bilateral) 10/01/2023   Albuminuria 08/24/2023   Nail dystrophy 08/24/2023   Rotator cuff arthropathy, left 11/17/2022   Status post reverse arthroplasty of shoulder, left 11/14/2022   Eye abnormality 10/30/2022   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 04/24/2022    Lumbar spondylosis 04/24/2022   TIA (transient ischemic attack) 04/02/2022   Yeast vaginitis 02/06/2022   Rotator cuff tear 12/09/2021   Syncope 09/03/2020   Low serum vitamin B12 05/28/2020   Fatigue 03/24/2020   Genetic testing 03/23/2020   Family history of breast cancer    Family history of prostate cancer    Family history of melanoma    Carcinoma of upper-outer quadrant of right breast in female, estrogen receptor positive (HCC) 10/28/2019   Pain due to onychomycosis of toenails of both feet 06/09/2019   Prediabetes 12/20/2018   Macular degeneration 06/20/2018   GERD (gastroesophageal reflux disease) 06/20/2018   Hypersomnia 06/20/2018   Chronic knee pain (4th area of Pain) (Bilateral) (R>L) 06/20/2018   Bruising 12/14/2017   History of lymphoma 06/11/2017   Vulvar lesion 05/16/2017   Chronic low back pain (1ry area of Pain) (Bilateral) (R>L) w/o sciatica 04/03/2017   History of transient ischemic attack (TIA) 02/10/2017   Hypokalemia 02/10/2017   Chronic hip pain (3ry area of Pain) (Right) 08/03/2016   Essential hypertension 07/06/2016   Hypomagnesemia 03/02/2016   Morbid obesity due to excess calories (HCC) 03/02/2016   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   Adjustment disorder with mixed anxiety and depressed mood 05/25/2015   Paget disease, 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   Eczema 02/27/2013   Muscle cramps 11/13/2012  Hypothyroidism 02/20/2012   HLD (hyperlipidemia) 02/20/2012    Past Medical History: Past Medical History:  Diagnosis Date   Adult pulmonary Langerhans cell histiocytosis (HCC)    Eosinophilic Granuloma of the Lung)   Allergic rhinitis    Antineoplastic chemotherapy induced anemia    Arthritis    Breast cancer, right (HCC) 09/17/2019   a.) Bx (+) stage 1 IMC (G1, ER/PR +, Her2/neu -); pT1a N0 M0; Tx'd with adjuvant XRT + endocrine therapy   Complication of anesthesia    pt was awake during intubation for colonoscopy   Diastolic dysfunction    a.)  TTE 11/14/2016: EF 60-65%, no rwma, mild LVH, triv MR, mildly dil LA, nl RVSF, G1DD; b. 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   Tubular adenoma of colon     Past Surgical History: Past Surgical History:  Procedure Laterality Date   ABDOMINAL HYSTERECTOMY  1985   Hysterectomy-partial   BREAST BIOPSY Right 2002   Neg - AT [facility]   BREAST BIOPSY Right 2020   bx done at [facility]?, IDC and DCIS   BREAST LUMPECTOMY Right 10/16/2019   IDC and DCIS, negative LN   BURR HOLE W/ PLACEMENT OMMAYA RESERVOIR     BURR HOLE W/ PLACEMENT OMMAYA RESERVOIR  2012   COLONOSCOPY  04/2013   ESOPHAGOGASTRODUODENOSCOPY  04/2013   INJECTION KNEE  07/02/2018   INSERTION CENTRAL VENOUS ACCESS DEVICE W/ SUBCUTANEOUS  PORT  2011   Port a Cath: Right chest Double Lumen, 04-Nov-2010   LIMBAL STEM CELL TRANSPLANT  2012   LIVER BIOPSY     stage 4B large Bcell lymphoma   PARTIAL MASTECTOMY WITH NEEDLE LOCALIZATION AND AXILLARY SENTINEL LYMPH NODE BX Right 10/16/2019   Procedure: PARTIAL MASTECTOMY WITH NEEDLE LOCALIZATION AND AXILLARY SENTINEL LYMPH NODE BX;  Surgeon: Sung Amabile, DO;  Location: [facility] OR[facility];  Service: General;  Laterality: Right;   REVERSE SHOULDER ARTHROPLASTY Left 11/14/2022   Procedure: REVERSE SHOULDER ARTHROPLASTY WITH BICEPS TENODESIS.;  Surgeon: Christena Flake, MD;  Location: [facility];  Service: Orthopedics;  Laterality: Left;   ROTATOR CUFF REPAIR Right 2016   SHOULDER ARTHROSCOPY Right  06/22/2015   Procedure: ARTHROSCOPY SHOULDER, parital repair of rotator cuff, biceps tenodesis, decompression and debridement;  Surgeon: Christena Flake, MD;  Location: [facility];  Service: Orthopedics;  Laterality: Right;   TONSILLECTOMY  1954   VULVECTOMY     VULVECTOMY PARTIAL N/A 09/29/2015   Procedure: VULVECTOMY PARTIAL;  Surgeon: Artelia Laroche, MD;  Location: [facility];  Service: Gynecology;  Laterality: N/A;    Past Gynecologic History:  S/p hysterectomy  OB History:  OB History  No obstetric history on file.    Family History: Family History  Problem Relation Age of Onset   Diabetes Mother        died @ 42 of MI.   Hypertension Mother    Heart attack Mother    Melanoma Father 3       died of complications r/t melanoma w/ lung mets.   Kidney disease Sister        Kidney removed    Breast cancer Sister 17   Prostate cancer Brother        Prostate - dx in 10's   Heart disease Brother 68       reported MI @ age 19, ? treated w/ TPA->no recurrent CAD, now in 67's.   Hearing loss Maternal Aunt    Cancer Maternal Aunt        pancreatic vs colon cancer   Hearing loss Maternal Grandfather    Other Maternal Grandmother        flu pandemic   Kidney cancer Niece 15       partial  nephrectomy    Social History: Social History   Socioeconomic History   Marital status: Married    Spouse name: Not on file   Number of children: 2   Years of education: Not on file   Highest education level: Bachelor's degree (e.g., BA, AB, BS)  Occupational History   Occupation: Pharmacologist Records, [employer]    Employer: [employer]  Tobacco Use   Smoking status: Never   Smokeless tobacco: Never  Vaping Use   Vaping status: Never Used  Substance and Sexual Activity   Alcohol use: No    Alcohol/week: 0.0 standard drinks of alcohol   Drug use: No   Sexual activity: Not Currently    Partners: Male    Birth control/protection: Post-menopausal  Other Topics Concern   Not on file  Social History Narrative   Lives in [place] with husband, has 2 grown sons.  No pets.  Retired from [employer].  Activity limited by chronic back pain-sedentary.   Right-handed   Caffeine: occasional caffeine free/diet soda or hot tea   Social Determinants of Health   Financial Resource Strain: Low Risk  (08/22/2023)   Overall Financial Resource Strain (CARDIA)    Difficulty of Paying Living Expenses: Not very hard  Food Insecurity: No Food Insecurity (08/22/2023)   Hunger Vital Sign    Worried About Running Out of Food in the Last Year: Never true    Ran Out of Food in the Last Year: Never true  Transportation Needs: No Transportation Needs (08/22/2023)   PRAPARE - Administrator, Civil Service (Medical): No    Lack of Transportation (Non-Medical): No  Physical Activity: Insufficiently Active (08/22/2023)   Exercise Vital Sign    Days of Exercise per Week: 1 day    Minutes of Exercise per Session: 10 min  Stress: No Stress Concern Present (08/22/2023)   [employer] of Occupational Health -  Occupational Stress Questionnaire    Feeling of Stress : Only a little  Social Connections: Socially Integrated (08/22/2023)   Social Connection and Isolation Panel [NHANES]     Frequency of Communication with Friends and Family: Twice a week    Frequency of Social Gatherings with Friends and Family: Once a week    Attends Religious Services: More than 4 times per year    Active Member of [employer] or Organizations: Yes    Attends Banker Meetings: More than 4 times per year    Marital Status: Married  Catering manager Violence: Not At Risk (04/26/2023)   Humiliation, Afraid, Rape, and Kick questionnaire    Fear of Current or Ex-Partner: No    Emotionally Abused: No    Physically Abused: No    Sexually Abused: No    Allergies: Allergies  Allergen Reactions   Morphine And Codeine Nausea And Vomiting and Nausea Only    Hallucinations   Sulfa Antibiotics Other (See Comments)    Dizzy/Fainting   Iodine Swelling    IV iodine (states now that this can be tolerated with Benadryl)   Adhesive [Tape] Rash and Other (See Comments)    Including Bandaids-paper tape ok to use   Erythromycin Nausea And Vomiting   Oxycodone Nausea And Vomiting    Current Medications: Current Outpatient Medications  Medication Sig Dispense Refill   acetaminophen (TYLENOL) 500 MG tablet Take 1,000 mg by mouth in the morning and at bedtime.     anastrozole (ARIMIDEX) 1 MG tablet TAKE 1 TABLET BY MOUTH DAILY 90 tablet 1   aspirin EC 81 MG tablet Take 81 mg by mouth daily. Swallow whole.     atorvastatin (LIPITOR) 80 MG tablet TAKE 1 TABLET BY MOUTH DAILY 90 tablet 3   cholecalciferol (VITAMIN D3) 25 MCG (1000 UT) tablet Take 1,000 Units by mouth daily.     ezetimibe (ZETIA) 10 MG tablet TAKE 1 TABLET BY MOUTH DAILY 90 tablet 3   hydrochlorothiazide (HYDRODIURIL) 12.5 MG tablet TAKE ONE TABLET BY MOUTH EVERY DAY 90 tablet 3   ketoconazole (NIZORAL) 2 % cream Apply twice daily as directed. (Patient taking differently: 1 Application daily as needed for irritation. Apply twice daily as directed.) 60 g 2   lansoprazole (PREVACID) 30 MG capsule TAKE 1 CAPSULE BY MOUTH ONCE DAILY 90  capsule 1   levothyroxine (SYNTHROID) 88 MCG tablet TAKE 1 TABLET EVERY DAY ON EMPTY STOMACHWITH A GLASS OF WATER AT LEAST 30-60 MINBEFORE BREAKFAST 90 tablet 3   lisinopril (ZESTRIL) 40 MG tablet TAKE 1 TABLET BY MOUTH DAILY 90 tablet 3   mometasone (ELOCON) 0.1 % cream APPLY TO ITCHY SPOTS ON BODY 1-2 TIMES DAILY UNTIL IMPROVED; AVOID FACE, GROIN,AND UNDERARMS 45 g 1   Multiple Vitamin (MULTIVITAMIN) tablet Take 1 tablet by mouth daily.     naproxen (NAPROSYN) 500 MG tablet TAKE 1 TABLET BY MOUTH TWICE DAILY AS NEEDED FOR MODERATE PAIN (TAKE WITH FOOD) 90 tablet 1   Specialty Vitamins Products (ICAPS LUTEIN & ZEAXANTHIN PO) Take 1 tablet by mouth every morning. Zeaxanthin 4mg /Lutein 10mg      tacrolimus (PROTOPIC) 0.1 % ointment Apply 1-2 times daily as needed for itch to affected areas. (Patient taking differently: 1 Application as needed. Apply 1-2 times daily as needed for itch to affected areas.) 60 g 2   No current facility-administered medications for this visit.   Review of Systems General:  fatigue and weakness Skin: no complaints Eyes: no complaints HEENT: no complaints Breasts:  no complaints Pulmonary: no complaints Cardiac: no complaints Gastrointestinal: no complaints Genitourinary/Sexual: no complaints Ob/Gyn: increased vulvar irritation Musculoskeletal: back pain Hematology: no complaints Neurologic/Psych: no complaints   Objective:  Physical Examination:  BP (!) 156/76   Pulse 76   Temp 98.7 F (37.1 C)   Resp 18   Wt 244 lb (110.7 kg)   SpO2 96%   BMI 43.22 kg/m    Body mass index is 43.22 kg/m.   GENERAL: Patient is a well appearing female in no acute distress HEENT:  Atraumatic and normocephalic.  NODES:  No cervical, supraclavicular, axillary, or inguinal lymphadenopathy palpated.  LUNGS: Normal respiratory effort ABDOMEN:  Soft, nontender. Nondistended. No masses/ascites/or hepatomegaly. Possible small umbilical hernia. EXTREMITIES:  Mild  symmetrical peripheral edema.   NEURO:  Nonfocal. Well oriented.  Appropriate affect.  Pelvic: exam chaperoned by CMA.  EGBUS: Multiple vulvar scars and post surgical changes to anatomy. Well healed. Scattered areas of hypopigmentation and diffuse erythema of left labia and vaginal introitus. Increased erythema on the left vulvar and tenderness with palpation Cervix: surgically absent Vagina: no lesions, no discharge or bleeding Uterus: surgically absent BME: no palpable masses Rectovaginal: deferred  The risks and benefits of the procedure were reviewed and informed consent obtained. Time out was performed. The patient received pre-procedure teaching and expressed understanding. The post-procedure instructions were reviewed with the patient and she expressed understanding. The patient does not have any barriers to learning. A representative lesion of the left vulva was biopsied. The area was numbed with Xylocaine jelly and injected with 2% lidocaine. A biopsy forceps was used to obtained a biopsy.  Hemostasis was obtained with silver nitrate. She tolerated the procedure well.      Previous exam:      Assessment:  Bailey Brooks is a 72 y.o. female diagnosed with recurrent Paget's disease of the vulva status post wide local excision with positive margins and subsequent recurrence in 07/2015.  Repeat wide local excision consistent with recurrent Paget's disease of the vulva status post wide local excision with negative margins.  Biopsy in 2018 was negative.  Biopsy in 2020 at 7:00 consistent with recurrent Paget's.  She deffered prior treatment due to breast cancer diagnosis and need for therapy. She has now completed treatment and she is ready to focus on management of Paget's disease.  01/16/20 started Imiquimod  completed 5 weeks of treatment then held 02/18/2020 due to ulceration, erythema, and pain. Restarted on 03/17/2020 and completed  08/14/2020. Negative biopsies 9/21. Opted for  surveillance.  However, she is now clinically symptomatic.  Plan to proceed with vulvar biopsy today and follow up results.    Right breast cancer- stage Ia ER positive right breast cancer - S/p lumpectomy and radiation, now on aromatase inhibitor. She is followed by medical oncology/Dr. Donneta Romberg and Dr. Rushie Chestnut.   Low back pain and hip pain  Shoulder pain- s/p right arthroplasty and left shoulder replacement, pain improved     Plan:   Problem List Items Addressed This Visit       Musculoskeletal and Integument   Extramammary Paget disease - Primary    We discussed different options if she has a positive biopsy for Paget's then we can consider the following options EUA Mohs surgery vs simple vulvectomy. She will follow up on December 4th, 2024.   I recommended that she discuss obtaining a MRI L/S spine to assess worsening back pain and MRI hip to assess her groin pain. Groin pain can in some cases  be due hip issues.   She will continue breast surveillance with Dr.Brahmanday.   Patient does request female gyn providers only.    The patient's diagnosis, an outline of the further diagnostic and laboratory studies which will be required, the recommendation for surgery, and alternatives were discussed with her and her accompanying family members.  All questions were answered to their satisfaction.   Masayuki Sakai Leta Jungling, MD

## 2023-10-23 LAB — SURGICAL PATHOLOGY

## 2023-10-23 NOTE — Progress Notes (Unsigned)
PROVIDER NOTE: Information contained herein reflects review and annotations entered in association with encounter. Interpretation of such information and data should be left to medically-trained personnel. Information provided to patient can be located elsewhere in the medical record under "Patient Instructions". Document created using STT-dictation technology, any transcriptional errors that may result from process are unintentional.    Patient: Bailey Brooks  Service Category: E/M  Provider: Oswaldo Done, MD  DOB: 06-04-1951  DOS: 10/24/2023  Referring Provider: Glori Luis, MD  MRN: [id number]  Specialty: Interventional Pain Management  PCP: Glori Luis, MD  Type: Established Patient  Setting: Ambulatory outpatient    Location: Office  Delivery: Face-to-face     Primary Reason(s) for Visit: Encounter for evaluation before starting new chronic pain management plan of care (Level of risk: moderate) CC: No chief complaint on file.  HPI  Bailey Brooks is a 72 y.o. year old, female patient, who comes today for a follow-up evaluation to review the test results and decide on a treatment plan. She has Hypothyroidism; HLD (hyperlipidemia); Muscle cramps; Eczema; Paget disease, extra mammary; Adjustment disorder with mixed anxiety and depressed mood; Extramammary Paget disease; Hypomagnesemia; Morbid obesity due to excess calories (HCC); Essential hypertension; Chronic hip pain (3ry area of Pain) (Right); History of transient ischemic attack (TIA); Hypokalemia; Chronic low back pain (1ry area of Pain) (Bilateral) (R>L) w/o sciatica; Vulvar lesion; History of lymphoma; Bruising; Macular degeneration; GERD (gastroesophageal reflux disease); Hypersomnia; Chronic knee pain (4th area of Pain) (Bilateral) (R>L); Prediabetes; Pain due to onychomycosis of toenails of both feet; Carcinoma of upper-outer quadrant of right breast in female, estrogen receptor positive (HCC); Family history of breast  cancer; Family history of prostate cancer; Family history of melanoma; Genetic testing; Fatigue; Low serum vitamin B12; Syncope; Yeast vaginitis; TIA (transient ischemic attack); 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 Her primarily concern today is the No chief complaint on file.  Pain Assessment: Location:     Radiating:   Onset:   Duration:   Quality:   Severity:  /10 (subjective, self-reported pain score)  Effect on ADL:   Timing:   Modifying factors:   BP:    HR:    Bailey Brooks comes in today for a follow-up visit after her initial evaluation on 10/01/2023. Today we went over the results of her tests. These were explained in "Layman's terms". During today's appointment we went over my diagnostic impression, as well as the proposed treatment plan.  Review of initial evaluation (10/01/2023): "According to the patient the primary area of pain is that of the lower back (Bilateral) (R>L).  The patient denies any prior surgeries but does indicate having had physical therapy last year which lasted for approximately 6 months and it seemed to help to a certain degree.  The patient denies any nerve blocks but does indicate having had some x-rays at Dr. Marisa Sprinkles practice and 2023.  This particular pain is described by the patient has been constant and worsened with prolonged standing.  Although the patient denies having lower extremity pain, she does  complain of groin pain, hip pain, knee pain, difficulty walking, and intermittent numbness and weakness of the lower extremities.  The patient also describes being unable to walk for any significant distance without having to stop and sit down.  She refers all of her symptoms to improve when she sits down.  This is highly suspicious for lumbar spinal stenosis with neurogenic claudication.   The patient's secondary area pain is that of her groin (Bilateral) (R>L).  This pain is described to be intermittent and primarily triggered by standing up and walking.  The patient denies any surgeries in the area, physical therapy, nerve blocks, or imaging studies of the area.   The patient's third area pain is described to be that of the hips (Bilateral) (R>L).  She describes this pain has also been intermittent and more than a pain she describes it as a difficulty in moving the hip joints.  She was provided some prednisone which did seem to help with the lower extremity pain which resolved and currently she is not experiencing any leg pain or hip pain.   The patient's fourth area pain is that of the knees (Bilateral).  She refers this pain to be intermittent and currently is not active.  She refers not having any pain today.  The patient indicates that this pain has been treated by Dr. Joice Lofts who has done 2 knee injections on the right knee and 1 intra-articular knee injection on the left.  These injections do seem to provide her with some benefit.   The fifth area pain is that of the shoulders (Bilateral).  She also describes this pain as intermittent and currently not active.  She refers that this has also been treated by Dr. Joice Lofts where he did a left total shoulder replacement.   Medical comorbidities: The patient has a history of breast cancer and lymphoma.  She currently takes anastrozole (Arimidex) for her cancer, which she has to continue taking until March 2026.  She refers that she is convinced that some of  the back pain is coming from this medication since she had to stop it for a while and her low back pain significantly went down.  Research on the side effects and adverse reactions of this medications do confirm that 36% of patients will experience side effects such as morning stiffness and low back pain.  In addition, she indicates that she also takes Lipitor which has also been associated with muscle pain and low back pain.   Physical exam: Today the patient had significant difficulty in standing up straight and hyperextension of the lumbar spine was impossible due to increased pain.  The patient also has an antalgic gait where she is walking with her back flexed.  She had difficulty with toe walking and heel walking suggestive of lower extremity weakness.  Physical exam today was highly suggestive of lumbar spinal stenosis.   Imaging: The record indicates that prior to coming to our practice the patient had been sent to the [facility]R group where Dr. Filomena Jungling had apparently ordered an MRI of the lumbar spine, but the patient indicates that she did not go to have it done secondary to concerns regarding the co-pay and her current financial status where she feels she would not be able to pay for it.  Today I have talked to the patient about this and I do agree that she needs to have an MRI of the lumbar spine since her evaluation today was highly suspicious for that  of lumbar spinal stenosis with neurogenic claudication."  Review of diagnostic tests ordered on 10/01/2023: Diagnostic x-rays of the lumbar spine with bending views  ***  Patient presented with interventional treatment options. Ms. Immel was informed that I will not be providing medication management. Pharmacotherapy evaluation including recommendations may be offered, if specifically requested.   Controlled Substance Pharmacotherapy Assessment REMS (Risk Evaluation and Mitigation Strategy)  Opioid Analgesic:  None MME/day: 0 mg/day  Pill Count: None expected due to no prior prescriptions written by our practice. No notes on file Pharmacokinetics: Liberation and absorption (onset of action): WNL Distribution (time to peak effect): WNL Metabolism and excretion (duration of action): WNL         Pharmacodynamics: Desired effects: Analgesia: Ms. Dia reports >50% benefit. Functional ability: Patient reports that medication allows her to accomplish basic ADLs Clinically meaningful improvement in function (CMIF): Sustained CMIF goals met Perceived effectiveness: Described as relatively effective, allowing for increase in activities of daily living (ADL) Undesirable effects: Side-effects or Adverse reactions: None reported Monitoring: [place] PMP: PDMP reviewed during this encounter. Online review of the past 30-month period previously conducted. Not applicable at this point since we have not taken over the patient's medication management yet. List of other Serum/Urine Drug Screening Test(s):  Lab Results  Component Value Date   COCAINSCRNUR NONE DETECTED 02/10/2017   THCU NONE DETECTED 02/10/2017   ETH <5 02/10/2017   List of all UDS test(s) done:  No results found for: "TOXASSSELUR", "SUMMARY" Last UDS on record: No results found for: "TOXASSSELUR", "SUMMARY" UDS interpretation: No unexpected findings.          Medication Assessment Form: Not applicable. No opioids. Treatment compliance: Not applicable Risk Assessment Profile: Aberrant behavior: See initial evaluations. None observed or detected today Comorbid factors increasing risk of overdose: See initial evaluation. No additional risks detected today Opioid risk tool (ORT):     10/01/2023   11:26 AM  Opioid Risk   Alcohol 0  Illegal Drugs 0  Rx Drugs 0  Alcohol 0  Illegal Drugs 0  Rx Drugs 0  Age between 16-45 years  0  History of Preadolescent Sexual Abuse 0  Psychological Disease 0  Depression 0  Opioid Risk Tool Scoring 0   Opioid Risk Interpretation Low Risk    ORT Scoring interpretation table:  Score <3 = Low Risk for SUD  Score between 4-7 = Moderate Risk for SUD  Score >8 = High Risk for Opioid Abuse   Risk of substance use disorder (SUD): Low  Risk Mitigation Strategies:  Patient opioid safety counseling: No controlled substances prescribed. Patient-Prescriber Agreement (PPA): No agreement signed.  Controlled substance notification to other providers: None required. No opioid therapy.  Pharmacologic Plan: Non-opioid analgesic therapy offered. Interventional alternatives discussed.             Laboratory Chemistry Profile   Renal Lab Results  Component Value Date   BUN 24 (H) 08/24/2023   CREATININE 0.89 08/24/2023   GFR 64.25 08/24/2023   GFRAA >60 07/05/2020   GFRNONAA 44 (L) 06/05/2023   SPECGRAV >=1.030 03/20/2013   PHUR 5.5 03/20/2013   PROTEINUR 30 (A) 06/05/2023     Electrolytes Lab Results  Component Value Date   NA 139 08/24/2023   K 4.2 08/24/2023   CL 104 08/24/2023   CALCIUM 9.6 08/24/2023   MG 1.9 06/12/2018     Hepatic Lab Results  Component Value Date   AST 27 11/06/2022   ALT 22 11/06/2022   ALBUMIN  3.8 11/06/2022   ALKPHOS 94 11/06/2022   LIPASE 93 09/03/2013     ID Lab Results  Component Value Date   SARSCOV2NAA NEGATIVE 10/13/2019   STAPHAUREUS NEGATIVE 11/06/2022   MRSAPCR NEGATIVE 11/06/2022     Bone Lab Results  Component Value Date   VD25OH 59.67 11/23/2014     Endocrine Lab Results  Component Value Date   GLUCOSE 114 (H) 08/24/2023   GLUCOSEU NEGATIVE 06/05/2023   HGBA1C 5.8 (A) 01/26/2023   TSH 1.11 08/24/2023   FREET4 0.94 05/25/2015     Neuropathy Lab Results  Component Value Date   VITAMINB12 589 08/02/2020   HGBA1C 5.8 (A) 01/26/2023     CNS No results found for: "COLORCSF", "APPEARCSF", "RBCCOUNTCSF", "WBCCSF", "POLYSCSF", "LYMPHSCSF", "EOSCSF", "PROTEINCSF", "GLUCCSF", "JCVIRUS", "CSFOLI", "IGGCSF", "LABACHR", "ACETBL"    Inflammation (CRP: Acute  ESR: Chronic) No results found for: "CRP", "ESRSEDRATE", "LATICACIDVEN"   Rheumatology No results found for: "RF", "ANA", "LABURIC", "URICUR", "LYMEIGGIGMAB", "LYMEABIGMQN", "HLAB27"   Coagulation Lab Results  Component Value Date   INR 1.0 05/05/2022   LABPROT 12.6 05/05/2022   APTT 27 05/05/2022   PLT 214 06/05/2023     Cardiovascular Lab Results  Component Value Date   BNP 54.6 01/01/2017   HGB 12.8 06/05/2023   HCT 40.2 06/05/2023     Screening Lab Results  Component Value Date   SARSCOV2NAA NEGATIVE 10/13/2019   STAPHAUREUS NEGATIVE 11/06/2022   MRSAPCR NEGATIVE 11/06/2022     Cancer Lab Results  Component Value Date   CEA 0.7 10/14/2010     Allergens No results found for: "ALMOND", "APPLE", "ASPARAGUS", "AVOCADO", "BANANA", "BARLEY", "BASIL", "BAYLEAF", "GREENBEAN", "LIMABEAN", "WHITEBEAN", "BEEFIGE", "REDBEET", "BLUEBERRY", "BROCCOLI", "CABBAGE", "MELON", "CARROT", "CASEIN", "CASHEWNUT", "CAULIFLOWER", "CELERY"     Note: Lab results reviewed.  Recent Diagnostic Imaging Review  Cervical Imaging: Cervical CT wo contrast: Results for orders placed during the hospital encounter of 08/30/20 CT Cervical Spine Wo Contrast  Narrative CLINICAL DATA:  72 year old female status post fall 2 days ago.  EXAM: CT CERVICAL SPINE WITHOUT CONTRAST  TECHNIQUE: Multidetector CT imaging of the cervical spine was performed without intravenous contrast. Multiplanar CT image reconstructions were also generated.  COMPARISON:  Head CT today reported separately.  FINDINGS: Alignment: Preserved cervical lordosis. Cervicothoracic junction alignment is within normal limits. Bilateral posterior element alignment is within normal limits.  Skull base and vertebrae: Visualized skull base is intact. No atlanto-occipital dissociation. No acute osseous abnormality identified.  Soft tissues and spinal canal: No prevertebral fluid or swelling.  No visible canal hematoma. Partially retropharyngeal course of the right carotid. Calcified carotid atherosclerosis greater on the left. Otherwise negative visible noncontrast neck soft tissues.  Disc levels: Intermittent cervical disc and endplate degeneration but capacious spinal canal.  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 Otherwise negative.  IMPRESSION: 1. No acute traumatic injury identified in the cervical spine. 2. Aberrant origin of the right subclavian artery and retropharyngeal course of the right carotid, normal variants.   Electronically Signed By: Odessa Fleming M.D. On: 08/30/2020 14:11  Shoulder Imaging: Shoulder-R MR wo contrast: Results for orders placed during the hospital encounter of 06/11/15 MR Shoulder Right Wo Contrast  Narrative CLINICAL DATA:  Right shoulder pain and weakness.  Recent fall.  EXAM: MRI OF THE RIGHT SHOULDER WITHOUT CONTRAST  TECHNIQUE: Multiplanar, multisequence MR imaging of the shoulder was performed. No intravenous contrast was administered.  COMPARISON:  05/27/2015  FINDINGS: Rotator cuff: There is a large full-thickness retracted tear of the  supraspinous and infraspinatus tendons. The defect is approximately 4 x 5 cm. The tendons are retracted with a prominent intrasubstance tear at the musculotendinous junction of the infraspinatus. Teres minor tendon is intact. Shallow partial-thickness articular surface tear of the distal subscapularis tendon.  Muscles: Slight atrophy of the supraspinous and infraspinatus muscles.  Biceps long head:  Properly located and intact.  Acromioclavicular Joint: Hypertrophy of the acromioclavicular joint with a prominent anterior osteophyte on the acromion with a type 3 configuration which could predispose to impingement.  Glenohumeral Joint: Prominent glenohumeral joint effusion.  Labrum:  Intact.  Bones:  Normal.  IMPRESSION: 1. Large full-thickness  retracted tears of the infraspinatus and supraspinous tendons with slight atrophy of those muscles. Partial-thickness articular surface tear of the distal subscapularis tendon. 2. Prominent glenohumeral joint effusion. 3. Prominent anterior osteophyte on the type 3 acromion which could predispose to impingement.   Electronically Signed By: Francene Boyers M.D. On: 06/11/2015 14:08  Shoulder-L MR wo contrast: Results for orders placed during the hospital encounter of 06/30/22 MR SHOULDER LEFT WO CONTRAST  Narrative CLINICAL DATA:  Two recent falls. Persistent left shoulder pain and limited range of motion.  EXAM: MRI OF THE LEFT SHOULDER WITHOUT CONTRAST  TECHNIQUE: Multiplanar, multisequence MR imaging of the shoulder was performed. No intravenous contrast was administered.  COMPARISON:  None Available.  FINDINGS: Rotator cuff: Large full-thickness retracted rotator cuff tear. The supraspinatus, infraspinatus and subscapularis tendons are torn and retracted. The supraspinatus tendon is retracted approximately 2.7 cm and the infraspinatus is retracted approximately 3.5 cm.  Muscles: Associated fatty atrophy of the supraspinatus and infraspinatus muscles. There is also moderate fluid and edema surrounding the muscles.  Biceps long head:  Intact  Acromioclavicular Joint: Mild degenerative changes. Type 2 acromion. Mild lateral downsloping and moderate subacromial spurring. Marked narrowing of the humeroacromial space due to the full-thickness rotator cuff tear.  Glenohumeral Joint: Moderate degenerative changes and moderate-sized joint effusion with synovitis.  Labrum:  Labral degenerative changes. No obvious labral tears.  Bones:  No acute bony findings.  Other: Expected fluid in the subacromial/subdeltoid bursa.  IMPRESSION: 1. Large full-thickness retracted rotator cuff tear as described above. 2. Intact long head biceps tendon and degenerated glenoid  labrum. 3. Moderate lateral downsloping the acromion and moderate subacromial spurring. 4. Moderate glenohumeral joint degenerative changes with moderate-sized joint effusion and synovitis.   Electronically Signed By: Rudie Meyer M.D. On: 06/30/2022 13:13  Shoulder-R DG: Results for orders placed during the hospital encounter of 05/27/15 DG Shoulder Right  Narrative CLINICAL DATA:  Pain following fall  EXAM: RIGHT SHOULDER - 2+ VIEW  COMPARISON:  None.  FINDINGS: Frontal, Y scapular, and axillary images obtained. There is no fracture or dislocation. There is osteoarthritic change in the acromioclavicular and glenohumeral joint regions. There is bony overgrowth along the inferior acromion. No erosive change.  IMPRESSION: Bony overgrowth along the inferior acromion. Underlying generalized osteoarthritic change. No fracture or dislocation.  Bony overgrowth along the inferior acromion may place patient at risk for so-called impingement syndrome.   Electronically Signed By: Bretta Bang III M.D. On: 05/27/2015 09:08  Hip Imaging: Hip-R DG 2-3 views: Results for orders placed in visit on 01/28/21 DG HIP UNILAT W OR W/O PELVIS 2-3 VIEWS RIGHT  Narrative CLINICAL DATA:  Right hip pain x2 weeks  EXAM: DG HIP (WITH OR WITHOUT PELVIS) 2-3V RIGHT  COMPARISON:  None.  FINDINGS: No fracture or dislocation is seen.  Mild degenerative changes of the bilateral hips.  Visualized  bony pelvis appears intact.  Degenerative changes of the lower lumbar spine.  IMPRESSION: Mild degenerative changes of the bilateral hips.   Electronically Signed By: Charline Bills M.D. On: 01/29/2021 10:39  Knee Imaging: Knee-R DG 4 views: Results for orders placed in visit on 06/18/18 DG Knee Complete 4 Views Right  Narrative CLINICAL DATA:  Medial sided right knee pain.  No known injury.  EXAM: RIGHT KNEE - COMPLETE 4+ VIEW  COMPARISON:  None.  FINDINGS: No  fracture or dislocation. Moderate tricompartmental degenerative change of the knee, worse with the medial compartment with joint space loss, subchondral sclerosis and osteophytosis. There is minimal spurring the tibial spines. No evidence of chondrocalcinosis. Small knee joint effusion. Enthesopathic change involving the superior and inferior poles of the patella.  IMPRESSION: 1. Small knee joint effusion.  Otherwise, no acute findings. 2. Moderate tricompartmental degenerative change of the knee, worse within the medial compartment.   Electronically Signed By: Simonne Come M.D. On: 06/18/2018 09:01  Foot Imaging: Foot-L DG Complete: Results for orders placed in visit on 06/15/14 DG Foot Complete Left  Narrative 3 views of the left foot demonstrates an osseously mature foot large posterior and plantar calcaneal heel spurs. Some soft tissue increase in density at the plantar fascial calcaneal insertion site. Dorsal spurring and loss of joint space Lisfranc's joints left. No fractures noted.  Complexity Note: Imaging results reviewed.                         Meds   Current Outpatient Medications:    acetaminophen (TYLENOL) 500 MG tablet, Take 1,000 mg by mouth in the morning and at bedtime., Disp: , Rfl:    anastrozole (ARIMIDEX) 1 MG tablet, TAKE 1 TABLET BY MOUTH DAILY, Disp: 90 tablet, Rfl: 1   aspirin EC 81 MG tablet, Take 81 mg by mouth daily. Swallow whole., Disp: , Rfl:    atorvastatin (LIPITOR) 80 MG tablet, TAKE 1 TABLET BY MOUTH DAILY, Disp: 90 tablet, Rfl: 3   cholecalciferol (VITAMIN D3) 25 MCG (1000 UT) tablet, Take 1,000 Units by mouth daily., Disp: , Rfl:    ezetimibe (ZETIA) 10 MG tablet, TAKE 1 TABLET BY MOUTH DAILY, Disp: 90 tablet, Rfl: 3   hydrochlorothiazide (HYDRODIURIL) 12.5 MG tablet, TAKE ONE TABLET BY MOUTH EVERY DAY, Disp: 90 tablet, Rfl: 3   ketoconazole (NIZORAL) 2 % cream, Apply twice daily as directed. (Patient taking differently: 1 Application daily  as needed for irritation. Apply twice daily as directed.), Disp: 60 g, Rfl: 2   lansoprazole (PREVACID) 30 MG capsule, TAKE 1 CAPSULE BY MOUTH ONCE DAILY, Disp: 90 capsule, Rfl: 1   levothyroxine (SYNTHROID) 88 MCG tablet, TAKE 1 TABLET EVERY DAY ON EMPTY STOMACHWITH A GLASS OF WATER AT LEAST 30-60 MINBEFORE BREAKFAST, Disp: 90 tablet, Rfl: 3   lisinopril (ZESTRIL) 40 MG tablet, TAKE 1 TABLET BY MOUTH DAILY, Disp: 90 tablet, Rfl: 3   mometasone (ELOCON) 0.1 % cream, APPLY TO ITCHY SPOTS ON BODY 1-2 TIMES DAILY UNTIL IMPROVED; AVOID FACE, GROIN,AND UNDERARMS, Disp: 45 g, Rfl: 1   Multiple Vitamin (MULTIVITAMIN) tablet, Take 1 tablet by mouth daily., Disp: , Rfl:    naproxen (NAPROSYN) 500 MG tablet, TAKE 1 TABLET BY MOUTH TWICE DAILY AS NEEDED FOR MODERATE PAIN (TAKE WITH FOOD), Disp: 90 tablet, Rfl: 1   Specialty Vitamins Products (ICAPS LUTEIN & ZEAXANTHIN PO), Take 1 tablet by mouth every morning. Zeaxanthin 4mg /Lutein 10mg , Disp: , Rfl:  tacrolimus (PROTOPIC) 0.1 % ointment, Apply 1-2 times daily as needed for itch to affected areas. (Patient taking differently: 1 Application as needed. Apply 1-2 times daily as needed for itch to affected areas.), Disp: 60 g, Rfl: 2  ROS  Constitutional: Denies any fever or chills Gastrointestinal: No reported hemesis, hematochezia, vomiting, or acute GI distress Musculoskeletal: Denies any acute onset joint swelling, redness, loss of ROM, or weakness Neurological: No reported episodes of acute onset apraxia, aphasia, dysarthria, agnosia, amnesia, paralysis, loss of coordination, or loss of consciousness  Allergies  Ms. Durivage is allergic to morphine and codeine, sulfa antibiotics, iodine, adhesive [tape], erythromycin, and oxycodone.  PFSH  Drug: Ms. Mulato  reports no history of drug use. Alcohol:  reports no history of alcohol use. Tobacco:  reports that she has never smoked. She has never used smokeless tobacco. 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 Surgical: Ms. Vondrak  has a past surgical history that includes Tonsillectomy (1954); Limbal stem cell transplant (2012); Liver biopsy; Ines Bloomer hole w/ placement ommaya reservoir; Abdominal hysterectomy (1985); Vulvectomy; Shoulder arthroscopy (Right, 06/22/2015); Insertion central venous access device w/ subcutaneous port (2011); Vulvectomy partial (N/A, 09/29/2015); Colonoscopy (04/2013); Esophagogastroduodenoscopy (04/2013); Burr hole w/ placement ommaya reservoir (2012); Rotator cuff repair (Right, 2016); Injection knee (07/02/2018); Partial mastectomy with needle localization and axillary sentinel lymph node bx (Right, 10/16/2019); Breast biopsy (Right, 2002); Breast biopsy (Right, 2020); Breast lumpectomy (Right, 10/16/2019); and Reverse shoulder arthroplasty (Left, 11/14/2022). Family: family history includes Breast cancer (age of onset: 28) in her sister; Cancer in her maternal aunt; Diabetes in her mother; Hearing loss in her maternal aunt and maternal grandfather; Heart attack in her mother; Heart disease (age of onset: 87) in her brother; Hypertension in her mother; Kidney cancer (age of onset: 90) in her  niece; Kidney disease in her sister; Melanoma (age of onset: 61) in her father; Other in her maternal grandmother; Prostate cancer in her brother.  Constitutional Exam  General appearance: Well nourished, well developed, and well hydrated. In no apparent acute distress There were no vitals filed for this visit. BMI Assessment: Estimated body mass index is 43.22 kg/m as calculated from the following:   Height as of 10/01/23: 5\' 3"  (1.6 m).   Weight as of 10/17/23: 244 lb (110.7 kg).  BMI interpretation table: BMI level Category Range association with higher incidence of chronic pain  <18 kg/m2 Underweight   18.5-24.9 kg/m2 Ideal body weight   25-29.9 kg/m2 Overweight Increased incidence by 20%  30-34.9 kg/m2 Obese (Class I) Increased incidence by 68%  35-39.9 kg/m2 Severe obesity (Class II) Increased incidence by 136%  >40 kg/m2 Extreme obesity (Class III) Increased incidence by 254%   Patient's current BMI Ideal Body weight  There is no height or weight on file to calculate BMI. Ideal body weight: 52.4 kg (115 lb 8.3 oz) Adjusted ideal body weight: 75.7 kg (166 lb 14.6 oz)   BMI Readings  from Last 4 Encounters:  10/17/23 43.22 kg/m  10/01/23 42.51 kg/m  08/24/23 35.71 kg/m  07/26/23 42.94 kg/m   Wt Readings from Last 4 Encounters:  10/17/23 244 lb (110.7 kg)  10/01/23 240 lb (108.9 kg)  08/24/23 201 lb 9.6 oz (91.4 kg)  07/26/23 242 lb 6.4 oz (110 kg)    Psych/Mental status: Alert, oriented x 3 (person, place, & time)       Eyes: PERLA Respiratory: No evidence of acute respiratory distress  Assessment & Plan  Primary Diagnosis & Pertinent Problem List: The primary encounter diagnosis was Chronic low back pain (1ry area of Pain) (Bilateral) (R>L) w/o sciatica. 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  Visit Diagnosis: 1. Chronic low back pain (1ry area of Pain) (Bilateral) (R>L) w/o sciatica   2. Chronic groin pain  (2ry area of Pain) (Right)   3. Chronic hip pain (3ry area of Pain) (Right)   4. Chronic knee pain (4th area of Pain) (Bilateral) (R>L)   5. Chronic shoulder pain (5th area of Pain) (Bilateral)   6. 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 Lumbar spondylosis   8. Spinal stenosis, lumbar region, with neurogenic claudication    Problems updated and reviewed during this visit: No problems updated.  Plan of Care  Pharmacotherapy (Medications Ordered): No orders of the defined types were placed in this encounter.  Procedure Orders    No procedure(s) ordered today   Lab Orders  No laboratory test(s) ordered today   Imaging Orders  No imaging studies ordered today   Referral Orders  No referral(s) requested today    Pharmacological management:  Opioid Analgesics: I will not be prescribing any opioids at this time Membrane stabilizer: I will not be prescribing any at this time Muscle relaxant: I will not be prescribing any at this time NSAID: I will not be prescribing any at this time Other analgesic(s): I will not be prescribing any at this time      Interventional Therapies  Risk Factors  Considerations  Medical Comorbidities:  History of breast cancer and lymphoma     Planned  Pending:  Diagnosis/therapeutic midline L3-4 LESI #1 Diagnostic Lumbar spine MRI    Under consideration:   Diagnostic/therapeutic midline L3-4 LESI #1  Diagnostic/therapeutic bilateral IA hip joint injection #1    Completed:   None at this time   Therapeutic  Palliative (PRN) options:   None established   Completed by other  providers:   (07/24/2022) left shoulder injection by Poggi, Thalia Bloodgood, MD         Provider-requested follow-up: No follow-ups on file. Recent Visits Date Type Provider Dept  10/01/23 Office Visit Delano Metz, MD [facility]  Showing recent visits within past 90 days and meeting all other requirements Future Appointments Date Type Provider Dept  10/24/23 Appointment Delano Metz, MD [facility]  Showing future appointments within next 90 days and meeting all other requirements   Primary Care Physician: Glori Luis, MD  Duration of encounter: *** minutes.  Total time on encounter, as per AMA guidelines included both the face-to-face and non-face-to-face time personally  spent by the physician and/or other qualified health care professional(s) on the day of the encounter (includes time in activities that require the physician or other qualified health care professional and does not include time in activities normally performed by clinical staff). Physician's time may include the following activities when performed: Preparing to see the patient (e.g., pre-charting review of records, searching for previously ordered imaging, lab work, and nerve conduction tests) Review of prior analgesic pharmacotherapies. Reviewing PMP Interpreting ordered tests (e.g., lab work, imaging, nerve conduction tests) Performing post-procedure evaluations, including interpretation of diagnostic procedures Obtaining and/or reviewing separately obtained history Performing a medically appropriate examination and/or evaluation Counseling and educating the patient/family/caregiver Ordering medications, tests, or procedures Referring and communicating with other health care professionals (when not separately reported) Documenting clinical information in the electronic or other health record Independently interpreting results (not separately reported) and communicating results  to the patient/ family/caregiver Care coordination (not separately reported)  Note by: Oswaldo Done, MD (TTS technology used. I apologize for any typographical errors that were not detected and corrected.) Date: 10/24/2023; Time: 1:02 PM

## 2023-10-24 ENCOUNTER — Other Ambulatory Visit: Payer: Self-pay | Admitting: *Deleted

## 2023-10-24 ENCOUNTER — Telehealth: Payer: Self-pay | Admitting: Pain Medicine

## 2023-10-24 ENCOUNTER — Encounter: Payer: Self-pay | Admitting: Pain Medicine

## 2023-10-24 ENCOUNTER — Ambulatory Visit: Payer: Medicare Other | Attending: Pain Medicine | Admitting: Pain Medicine

## 2023-10-24 VITALS — BP 137/74 | HR 88 | Temp 97.2°F | Ht 63.0 in | Wt 244.0 lb

## 2023-10-24 DIAGNOSIS — M9973 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: Secondary | ICD-10-CM | POA: Insufficient documentation

## 2023-10-24 DIAGNOSIS — R4589 Other symptoms and signs involving emotional state: Secondary | ICD-10-CM | POA: Diagnosis present

## 2023-10-24 DIAGNOSIS — M25561 Pain in right knee: Secondary | ICD-10-CM | POA: Diagnosis not present

## 2023-10-24 DIAGNOSIS — M47816 Spondylosis without myelopathy or radiculopathy, lumbar region: Secondary | ICD-10-CM | POA: Diagnosis not present

## 2023-10-24 DIAGNOSIS — M25511 Pain in right shoulder: Secondary | ICD-10-CM | POA: Insufficient documentation

## 2023-10-24 DIAGNOSIS — M25562 Pain in left knee: Secondary | ICD-10-CM | POA: Diagnosis not present

## 2023-10-24 DIAGNOSIS — G8929 Other chronic pain: Secondary | ICD-10-CM | POA: Diagnosis not present

## 2023-10-24 DIAGNOSIS — M48062 Spinal stenosis, lumbar region with neurogenic claudication: Secondary | ICD-10-CM | POA: Diagnosis not present

## 2023-10-24 DIAGNOSIS — M25551 Pain in right hip: Secondary | ICD-10-CM | POA: Diagnosis not present

## 2023-10-24 DIAGNOSIS — R1031 Right lower quadrant pain: Secondary | ICD-10-CM | POA: Diagnosis not present

## 2023-10-24 DIAGNOSIS — M25512 Pain in left shoulder: Secondary | ICD-10-CM | POA: Insufficient documentation

## 2023-10-24 DIAGNOSIS — M545 Low back pain, unspecified: Secondary | ICD-10-CM | POA: Diagnosis not present

## 2023-10-24 MED ORDER — DIAZEPAM 5 MG PO TABS
5.0000 mg | ORAL_TABLET | ORAL | 0 refills | Status: DC
Start: 2023-10-24 — End: 2024-02-22

## 2023-10-24 NOTE — Telephone Encounter (Signed)
Rx request send to Dr. Laban Emperor.

## 2023-10-24 NOTE — Progress Notes (Signed)
Safety precautions to be maintained throughout the outpatient stay will include: orient to surroundings, keep bed in low position, maintain call bell within reach at all times, provide assistance with transfer out of bed and ambulation.  

## 2023-10-24 NOTE — Telephone Encounter (Signed)
ERROR

## 2023-10-24 NOTE — Patient Instructions (Signed)

## 2023-10-24 NOTE — Telephone Encounter (Signed)
PT is schedule for MRI on tomorrow at 8am, patient is claustrophobic and will need medication to be send to pharmacy. TY

## 2023-10-25 ENCOUNTER — Ambulatory Visit
Admission: RE | Admit: 2023-10-25 | Discharge: 2023-10-25 | Disposition: A | Discharge status: Home / Self Care | Payer: Medicare Other | Source: Ambulatory Visit | Attending: Pain Medicine | Admitting: Pain Medicine

## 2023-10-25 DIAGNOSIS — M25561 Pain in right knee: Secondary | ICD-10-CM | POA: Diagnosis not present

## 2023-10-25 DIAGNOSIS — M25562 Pain in left knee: Secondary | ICD-10-CM | POA: Insufficient documentation

## 2023-10-25 DIAGNOSIS — M48062 Spinal stenosis, lumbar region with neurogenic claudication: Secondary | ICD-10-CM | POA: Insufficient documentation

## 2023-10-25 DIAGNOSIS — M545 Low back pain, unspecified: Secondary | ICD-10-CM | POA: Insufficient documentation

## 2023-10-25 DIAGNOSIS — M25511 Pain in right shoulder: Secondary | ICD-10-CM | POA: Diagnosis not present

## 2023-10-25 DIAGNOSIS — M25512 Pain in left shoulder: Secondary | ICD-10-CM | POA: Insufficient documentation

## 2023-10-25 DIAGNOSIS — M25551 Pain in right hip: Secondary | ICD-10-CM | POA: Insufficient documentation

## 2023-10-25 DIAGNOSIS — R1031 Right lower quadrant pain: Secondary | ICD-10-CM | POA: Diagnosis not present

## 2023-10-25 DIAGNOSIS — G8929 Other chronic pain: Secondary | ICD-10-CM | POA: Diagnosis not present

## 2023-10-25 DIAGNOSIS — M48061 Spinal stenosis, lumbar region without neurogenic claudication: Secondary | ICD-10-CM | POA: Diagnosis not present

## 2023-10-25 DIAGNOSIS — M5137 Other intervertebral disc degeneration, lumbosacral region with discogenic back pain only: Secondary | ICD-10-CM | POA: Diagnosis not present

## 2023-10-25 DIAGNOSIS — G96191 Perineural cyst: Secondary | ICD-10-CM | POA: Diagnosis not present

## 2023-10-25 DIAGNOSIS — M9973 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: Secondary | ICD-10-CM | POA: Insufficient documentation

## 2023-10-25 DIAGNOSIS — M47816 Spondylosis without myelopathy or radiculopathy, lumbar region: Secondary | ICD-10-CM | POA: Insufficient documentation

## 2023-10-25 DIAGNOSIS — N281 Cyst of kidney, acquired: Secondary | ICD-10-CM | POA: Diagnosis not present

## 2023-11-05 ENCOUNTER — Other Ambulatory Visit: Payer: Self-pay | Admitting: Family Medicine

## 2023-11-05 DIAGNOSIS — K2100 Gastro-esophageal reflux disease with esophagitis, without bleeding: Secondary | ICD-10-CM

## 2023-11-07 ENCOUNTER — Inpatient Hospital Stay: Payer: Medicare Other | Attending: Internal Medicine | Admitting: Obstetrics and Gynecology

## 2023-11-07 ENCOUNTER — Encounter: Payer: Self-pay | Admitting: Obstetrics and Gynecology

## 2023-11-07 VITALS — BP 143/95 | HR 82 | Temp 97.8°F | Resp 19 | Wt 243.2 lb

## 2023-11-07 DIAGNOSIS — C4499 Other specified malignant neoplasm of skin, unspecified: Secondary | ICD-10-CM

## 2023-11-07 DIAGNOSIS — Z9079 Acquired absence of other genital organ(s): Secondary | ICD-10-CM | POA: Diagnosis not present

## 2023-11-07 DIAGNOSIS — Z8544 Personal history of malignant neoplasm of other female genital organs: Secondary | ICD-10-CM | POA: Diagnosis present

## 2023-11-07 NOTE — Progress Notes (Signed)
Gynecologic Oncology Interval Visit   Referring Provider: Dr. Tiburcio Pea  Chief Concern: Recurrent paget's disease of the vulva  Subjective:  Bailey Brooks is a 72 y.o. female, diagnosed with Paget's of the vulva, s/p WLE and vulvar biopsy 09/29/2015, who returns to clinic to re-evaluation.   On 10/17/23 she underwent biopsy with Dr. Sonia Side due to increased vulvar irritation.   1. Vulva, biopsy, Left :       -  SKIN WITH HYPERKERATOSIS. NEGATIVE FOR DYSPLASIA OR MALIGNANCY.   Her vulvar irritation as nearly resolved since the biopsy. She's been applying antibiotic cream which helps.   She had MRI L/S due to worsening back and hip pain, thought to be radiating from groin vs radiating from back to groin.   10/25/23- MR Lumbar Spine WO Contrast is pending.     Gynecologic Oncology History: Bailey Brooks has a history of localized vulvar Paget's disease.   04/2013- vulvar biopsy revealed Paget's disease               WLE, additional margins resected for positive disease on frozen evaluation.   Part A: VULVA, VAGINAL MARGIN:  - POSITIVE FOR EXTRAMAMMARY PAGET'S DISEASE  Part B: VULVA, POSTERIOR MARGIN:  - POSITIVE FOR EXTRAMAMMARY PAGET'S DISEASE  Part C: VULVA, RIGHT, PARTIAL VULVECTOMY:  - EXTRAMAMMARY PAGET'S DISEASE  Part D: VULVA, NEW VAGINAL MARGIN:  - POSITIVE FOR EXTRAMAMMARY PAGET'S DISEASE  07/28/2015 for routine surveillance an area on the right vulva seemed more suspicious for recurrence. This was biopsied and confirmed recurrent Paget's disease.   She underwent repeat excision 09/29/15 A. VULVA; EXCISION:  - RARE CYTOKERATIN 7 POSITIVE INTRAEPIDERMAL CELLS, INTERPRETED AS RESIDUAL PAGET DISEASE.  - THE SURGICAL MARGINS ARE CLEAR.   B. VULVA, 11:00; BIOPSY:  - RARE CYTOKERATIN 7 POSITIVE INTRAEPIDERMAL CELLS, INTERPRETED AS PAGET DISEASE.   No additional issues since surgery.   She had vulvar biopsy on 05/09/17 which was negative:   DIAGNOSIS:  A. VULVA;  BIOPSY:  - SKIN WITH FOCAL KERATOSIS AND HYPERGRANULOSIS - NEGATIVE FOR DYSPLASIA AND MALIGNANCY  Of note, she has had a h/o recurrent vulvar candidiasis. Prior to her vulvar surgery 09/2015 she received 5 weekly doses of oral diflucan for vulvar candidiasis. Postop she was treated for yeast infection again in 12/17 and then took one Diflucan a week for six months with good results.   Seen in gyn-onc clinic on 01/16/18 by Dr. Sonia Side. NED at that time.   She has swelling of her lower extremities which was evaluated by her PCP and thought to be related to amlodipine. She started lasix which has improved her symptoms. She was seen by Dr. 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   She saw Dr. Sonia Side on 01/29/2019 with symptoms concerning for candidiasis at that time but no evidence of recurrent disease.   Her blood pressure medications were recently changes due to swelling secondary to amlodipine. No on lisinopril.   Previous biopsies were consistent with recurrent Paget's.   DIAGNOSIS:  A. VULVA, LEFT AT 3:00; PUNCH BIOPSY:  - BENIGN VULVAR TISSUE.  - NEGATIVE FOR DYSPLASIA AND MALIGNANCY.   DIAGNOSIS:  A. VULVA, RIGHT AT 7:00; PUNCH BIOPSY:  - EXTRAMAMMARY PAGET'S DISEASE OF THE VULVA.  - NEGATIVE FOR DYSPLASIA AND MALIGNANCY.   In the interim she has completed surgery and radiation for stage I right breast cancer, ER/PR positive HER-2/neu negative. She completed radiation on 12/29/2019. Anastrozole was recommended and she  plans to start in approximately late February.   At last visit we had recommended start Aldara but due to recent diagnosis of breast cancer at that time, patient requested to wait to start treatment and returns today to discuss options for management and consider starting Aldara. She is concerned about reaching site to be treated and tolerability. Her daughter-in-law is going to help her with application. Previously  she had expressed desire to avoid surgery/Mohs.   01/16/20 started Imiquimod  completed 5 weeks of treatment then held 02/18/2020 due to ulceration, erythema, and pain. Restarted on 03/17/2020. We recommended to continue imiquimod for 16 weeks and add clobetasol, 0.05% cream to vulva on alternating days that she does not use imiquimod to help with inflammation and reduce risk of recurrent ulcer/skin breakdown. Completed treatment on 08/14/2020.   08/14/20 She completed imiquimod treatment. At last appointment, post imiquimod, she underwent biopsy x 2 which were negative for Paget's. We discussed maintenance imiquimod vs monitoring. She elected close surveillance.   08/25/20 DIAGNOSIS:  A. VULVA, RIGHT; BIOPSY:  - BENIGN SQUAMOUS MUCOSA WITH MILD CHRONIC INFLAMMATION.  - NEGATIVE FOR DYSPLASIA AND MALIGNANCY.  - NEGATIVE FOR ACTIVE INFLAMMATION AND FUNGAL ELEMENTS.   B. VULVA, LEFT; BIOPSY:  - BENIGN SQUAMOUS MUCOSA WITH MILD CHRONIC INFLAMMATION.  - NEGATIVE FOR DYSPLASIA AND MALIGNANCY.  - NEGATIVE FOR ACTIVE INFLAMMATION AND FUNGAL ELEMENTS.   Comment:  The history of extramammary Paget's disease is noted. Immunohistochemical stain for CK7 (blocks A1 and B1) is negative.   Patient had vasovagal episode secondary to straining to have BM in September 2021 and was seen in ER. Concerned lidocaine for biopsy could have contributed. She was seen in ER and had negative head ct. Left frontal ventriculoperitoneal shunt was stable on imaging.   10/17/23 she underwent biopsy due to increased vulvar irritation.   1. Vulva, biopsy, Left : SKIN WITH HYPERKERATOSIS. NEGATIVE FOR DYSPLASIA OR MALIGNANCY.   Problem List: Patient Active Problem List   Diagnosis Date Noted   Anxiety about treatment 10/24/2023   Chronic pain syndrome 10/01/2023   Pharmacologic therapy 10/01/2023   Disorder of skeletal system 10/01/2023   Problems influencing health status 10/01/2023   Chronic groin pain  (2ry area of  Pain) (Right) 10/01/2023   History of breast cancer 10/01/2023   Chronic groin pain (Left) 10/01/2023   Spinal stenosis, lumbar region, with neurogenic claudication 10/01/2023   Chronic shoulder pain (5th area of Pain) (Bilateral) 10/01/2023   Albuminuria 08/24/2023   Nail dystrophy 08/24/2023   Rotator cuff arthropathy, left 11/17/2022   Status post reverse arthroplasty of shoulder, left 11/14/2022   Eye abnormality 10/30/2022   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 04/24/2022   Lumbar spondylosis 04/24/2022   TIA (transient ischemic attack) 04/02/2022   Yeast vaginitis 02/06/2022   Rotator cuff tear 12/09/2021   Syncope 09/03/2020   Low serum vitamin B12 05/28/2020   Fatigue 03/24/2020   Genetic testing 03/23/2020   Family history of breast cancer    Family history of prostate cancer    Family history of melanoma    Carcinoma of upper-outer quadrant of right breast in female, estrogen receptor positive (HCC) 10/28/2019   Pain due to onychomycosis of toenails of both feet 06/09/2019   Prediabetes 12/20/2018   Macular degeneration 06/20/2018   GERD (gastroesophageal reflux disease) 06/20/2018   Hypersomnia 06/20/2018   Chronic knee pain (4th area of Pain) (Bilateral) (R>L) 06/20/2018   Bruising 12/14/2017   History of lymphoma  06/11/2017   Vulvar lesion 05/16/2017   Chronic low back pain (1ry area of Pain) (Bilateral) (R>L) w/o sciatica 04/03/2017   History of transient ischemic attack (TIA) 02/10/2017   Hypokalemia 02/10/2017   Chronic hip pain (3ry area of Pain) (Right) 08/03/2016   Essential hypertension 07/06/2016   Hypomagnesemia 03/02/2016   Morbid obesity due to excess calories (HCC) 03/02/2016   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   Adjustment disorder with mixed anxiety and depressed mood 05/25/2015   Paget disease, 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   Eczema 02/27/2013   Muscle cramps 11/13/2012   Hypothyroidism 02/20/2012   HLD  (hyperlipidemia) 02/20/2012    Past Medical History: Past Medical History:  Diagnosis Date   Adult pulmonary Langerhans cell histiocytosis (HCC)    Eosinophilic Granuloma of the Lung)   Allergic rhinitis    Antineoplastic chemotherapy induced anemia    Arthritis    Breast cancer, right (HCC) 09/17/2019   a.) Bx (+) stage 1 IMC (G1, ER/PR +, Her2/neu -); pT1a N0 M0; Tx'd with adjuvant XRT + endocrine therapy   Complication of anesthesia    pt was awake during intubation for colonoscopy   Diastolic dysfunction    a.) TTE 11/14/2016: EF 60-65%, no rwma, mild LVH, triv MR, mildly dil LA, nl RVSF, G1DD; b. 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   Tubular adenoma of colon     Past  Surgical History: Past Surgical History:  Procedure Laterality Date   ABDOMINAL HYSTERECTOMY  1985   Hysterectomy-partial   BREAST BIOPSY Right 2002   Neg - AT [facility]   BREAST BIOPSY Right 2020   bx done at [facility]?, IDC and DCIS   BREAST LUMPECTOMY Right 10/16/2019   IDC and DCIS, negative LN   BURR HOLE W/ PLACEMENT OMMAYA RESERVOIR     BURR HOLE W/ PLACEMENT OMMAYA RESERVOIR  2012   COLONOSCOPY  04/2013   ESOPHAGOGASTRODUODENOSCOPY  04/2013   INJECTION KNEE  07/02/2018   INSERTION CENTRAL VENOUS ACCESS DEVICE W/ SUBCUTANEOUS PORT  2011   Port a Cath: Right chest Double Lumen, 04-Nov-2010   LIMBAL STEM CELL TRANSPLANT  2012  LIVER BIOPSY     stage 4B large Bcell lymphoma   PARTIAL MASTECTOMY WITH NEEDLE LOCALIZATION AND AXILLARY SENTINEL LYMPH NODE BX Right 10/16/2019   Procedure: PARTIAL MASTECTOMY WITH NEEDLE LOCALIZATION AND AXILLARY SENTINEL LYMPH NODE BX;  Surgeon: Sung Amabile, DO;  Location: [facility] ORS;  Service: General;  Laterality: Right;   REVERSE SHOULDER ARTHROPLASTY Left 11/14/2022   Procedure: REVERSE SHOULDER ARTHROPLASTY WITH BICEPS TENODESIS.;  Surgeon: Christena Flake, MD;  Location: [facility];  Service: Orthopedics;  Laterality: Left;   ROTATOR CUFF REPAIR Right 2016   SHOULDER ARTHROSCOPY Right 06/22/2015   Procedure: ARTHROSCOPY SHOULDER, parital repair of rotator cuff, biceps tenodesis, decompression and debridement;  Surgeon: Christena Flake, MD;  Location: [facility];  Service: Orthopedics;  Laterality: Right;   TONSILLECTOMY  1954   VULVECTOMY     VULVECTOMY PARTIAL N/A 09/29/2015   Procedure: VULVECTOMY PARTIAL;  Surgeon: Artelia Laroche, MD;  Location: [facility];  Service: Gynecology;  Laterality: N/A;    Past Gynecologic History:  S/p hysterectomy  OB History:  OB History  No obstetric history on file.    Family History: Family History  Problem Relation Age of Onset   Diabetes Mother        died @ 74 of MI.   Hypertension Mother    Heart attack  Mother    Melanoma Father 65       died of complications r/t melanoma w/ lung mets.   Kidney disease Sister        Kidney removed    Breast cancer Sister 76   Prostate cancer Brother        Prostate - dx in 83's   Heart disease Brother 15       reported MI @ age 66, ? treated w/ TPA->no recurrent CAD, now in 20's.   Hearing loss Maternal Aunt    Cancer Maternal Aunt        pancreatic vs colon cancer   Hearing loss Maternal Grandfather    Other Maternal Grandmother        flu pandemic   Kidney cancer Niece 41       partial nephrectomy    Social History: Social History   Socioeconomic History   Marital status: Married    Spouse name: Not on file   Number of children: 2   Years of education: Not on file   Highest education level: Bachelor's degree (e.g., BA, AB, BS)  Occupational History   Occupation: Pharmacologist Records, [employer]    Employer: [employer]  Tobacco Use   Smoking status: Never   Smokeless tobacco: Never  Vaping Use   Vaping status: Never Used  Substance and Sexual Activity   Alcohol use: No    Alcohol/week: 0.0 standard drinks of alcohol   Drug use: No   Sexual activity: Not Currently    Partners: Male    Birth control/protection: Post-menopausal  Other Topics Concern   Not on file  Social History Narrative   Lives in [place] with husband, has 2 grown sons.  No pets.  Retired from [employer].  Activity limited by chronic back pain-sedentary.   Right-handed   Caffeine: occasional caffeine free/diet soda or hot tea   Social Determinants of Health   Financial Resource Strain: Low Risk  (08/22/2023)   Overall Financial Resource Strain (CARDIA)    Difficulty of Paying Living Expenses: Not very hard  Food Insecurity: No Food Insecurity (08/22/2023)   Hunger Vital Sign  Worried About Programme researcher, broadcasting/film/video in the Last Year: Never true    Ran Out of Food in the Last Year: Never true  Transportation Needs: No Transportation Needs (08/22/2023)    PRAPARE - Administrator, Civil Service (Medical): No    Lack of Transportation (Non-Medical): No  Physical Activity: Insufficiently Active (08/22/2023)   Exercise Vital Sign    Days of Exercise per Week: 1 day    Minutes of Exercise per Session: 10 min  Stress: No Stress Concern Present (08/22/2023)   [employer] of Occupational Health - Occupational Stress Questionnaire    Feeling of Stress : Only a little  Social Connections: Socially Integrated (08/22/2023)   Social Connection and Isolation Panel [NHANES]    Frequency of Communication with Friends and Family: Twice a week    Frequency of Social Gatherings with Friends and Family: Once a week    Attends Religious Services: More than 4 times per year    Active Member of [employer] or Organizations: Yes    Attends Banker Meetings: More than 4 times per year    Marital Status: Married  Catering manager Violence: Not At Risk (04/26/2023)   Humiliation, Afraid, Rape, and Kick questionnaire    Fear of Current or Ex-Partner: No    Emotionally Abused: No    Physically Abused: No    Sexually Abused: No    Allergies: Allergies  Allergen Reactions   Morphine And Codeine Nausea And Vomiting and Nausea Only    Hallucinations   Sulfa Antibiotics Other (See Comments)    Dizzy/Fainting   Iodine Swelling    IV iodine (states now that this can be tolerated with Benadryl)   Adhesive [Tape] Rash and Other (See Comments)    Including Bandaids-paper tape ok to use   Erythromycin Nausea And Vomiting   Oxycodone Nausea And Vomiting    Current Medications: Current Outpatient Medications  Medication Sig Dispense Refill   acetaminophen (TYLENOL) 500 MG tablet Take 1,000 mg by mouth in the morning and at bedtime.     anastrozole (ARIMIDEX) 1 MG tablet TAKE 1 TABLET BY MOUTH DAILY 90 tablet 1   aspirin EC 81 MG tablet Take 81 mg by mouth daily. Swallow whole.     atorvastatin (LIPITOR) 80 MG tablet TAKE 1 TABLET BY MOUTH  DAILY 90 tablet 3   cholecalciferol (VITAMIN D3) 25 MCG (1000 UT) tablet Take 1,000 Units by mouth daily.     diazepam (VALIUM) 5 MG tablet Take 1 tablet (5 mg total) by mouth 60 (sixty) minutes before procedure for 1 dose. 1 tab PO 60 min pre-MRI. If still anxious, take 2nd tab 15 min just before MRI. Max: 2 tbs (10 mg). Avoid taking opioid pain medications within 4 hours of taking valium. Must have a driver. Do not drive or operate machinery x 24 hours after taking this medication. 2 tablet 0   ezetimibe (ZETIA) 10 MG tablet TAKE 1 TABLET BY MOUTH DAILY 90 tablet 3   hydrochlorothiazide (HYDRODIURIL) 12.5 MG tablet TAKE ONE TABLET BY MOUTH EVERY DAY 90 tablet 3   ketoconazole (NIZORAL) 2 % cream Apply twice daily as directed. (Patient taking differently: 1 Application daily as needed for irritation. Apply twice daily as directed.) 60 g 2   lansoprazole (PREVACID) 30 MG capsule TAKE 1 CAPSULE BY MOUTH ONCE DAILY 90 capsule 1   levothyroxine (SYNTHROID) 88 MCG tablet TAKE 1 TABLET EVERY DAY ON EMPTY STOMACHWITH A GLASS OF WATER AT LEAST 30-60  MINBEFORE BREAKFAST 90 tablet 3   lisinopril (ZESTRIL) 40 MG tablet TAKE 1 TABLET BY MOUTH DAILY 90 tablet 3   mometasone (ELOCON) 0.1 % cream APPLY TO ITCHY SPOTS ON BODY 1-2 TIMES DAILY UNTIL IMPROVED; AVOID FACE, GROIN,AND UNDERARMS 45 g 1   Multiple Vitamin (MULTIVITAMIN) tablet Take 1 tablet by mouth daily.     naproxen (NAPROSYN) 500 MG tablet TAKE 1 TABLET BY MOUTH TWICE DAILY AS NEEDED FOR MODERATE PAIN (TAKE WITH FOOD) 90 tablet 1   Specialty Vitamins Products (ICAPS LUTEIN & ZEAXANTHIN PO) Take 1 tablet by mouth every morning. Zeaxanthin 4mg /Lutein 10mg      tacrolimus (PROTOPIC) 0.1 % ointment Apply 1-2 times daily as needed for itch to affected areas. (Patient taking differently: 1 Application as needed. Apply 1-2 times daily as needed for itch to affected areas.) 60 g 2   No current facility-administered medications for this visit.   Review of  Systems General:  no complaints Skin: no complaints Eyes: no complaints HEENT: no complaints Breasts: no complaints Pulmonary: no complaints Cardiac: no complaints Gastrointestinal: no complaints Genitourinary/Sexual: no complaints Ob/Gyn: no complaints Musculoskeletal: chronic back pain Hematology: no complaints Neurologic/Psych: no complaints    Objective:  Physical Examination:  BP (!) 143/95   Pulse 82   Temp 97.8 F (36.6 C)   Resp 19   Wt 243 lb 3.2 oz (110.3 kg)   SpO2 97%   BMI 43.08 kg/m    Body mass index is 43.08 kg/m.   GENERAL: Patient is a well appearing female in no acute distress HEENT:  Atraumatic and normocephalic.  LUNGS: Normal respiratory effort ABDOMEN:  Soft, nontender. Nondistended.  EXTREMITIES:  Mild symmetrical peripheral edema.   NEURO:  Nonfocal. Well oriented.  Appropriate affect.  Pelvic: exam chaperoned by NP  EGBUS: Multiple vulvar scars and post surgical changes to anatomy. Well healed. Scattered areas of hypopigmentation and diffuse erythema of left labia and vaginal introitus. Increased erythema on the left vulvar and tenderness with palpation. Biopsy site has healed well. We had not previously captured the medial left vulva on photographs before. Overall stable.   BME & Speculum exam deferred         Previous exam:      Assessment:  Bailey Brooks is a 72 y.o. female diagnosed with recurrent Paget's disease of the vulva status post wide local excision with positive margins and subsequent recurrence in 07/2015.  Repeat wide local excision consistent with recurrent Paget's disease of the vulva status post wide local excision with negative margins.  Biopsy in 2018 was negative.  Biopsy in 2020 at 7:00 consistent with recurrent Paget's.  She deffered prior treatment due to breast cancer diagnosis and need for therapy. She has now completed treatment and she is ready to focus on management of Paget's disease.  01/16/20 started  Imiquimod  completed 5 weeks of treatment then held 02/18/2020 due to ulceration, erythema, and pain. Restarted on 03/17/2020 and completed  08/14/2020. Negative biopsies 9/21. Opted for surveillance.  However, she is now clinically symptomatic. Vulvar biopsy was negative and her symptoms have resolved. Biopsy healed well.    Right breast cancer- stage Ia ER positive right breast cancer - S/p lumpectomy and radiation, now on aromatase inhibitor. She is followed by medical oncology/Dr. Donneta Romberg and Dr. Rushie Chestnut.   Low back pain and hip pain. MRI with pain management pending.   Shoulder pain- s/p right arthroplasty and left shoulder replacement, pain improved     Plan:   Problem List  Items Addressed This Visit       Musculoskeletal and Integument   Extramammary Paget disease - Primary    We discussed different options if she has a positive biopsy for Paget's then we can consider the following options EUA Mohs surgery vs simple vulvectomy. However the biopsy was negative. So continue surveillance at this time. We will follow up with pathology to check if any additional staining needs to be conducted.   I recommended that she discuss obtaining a MRI L/S spine to assess worsening back pain and MRI hip to assess her groin pain. Groin pain can in some cases be due hip issues. Imaging results are still pending.   She will continue breast surveillance with Dr.Brahmanday.   We will plan to see her back in 6 months for follow up or sooner if she develops worsening symptoms. Patient does request female gyn providers only.    The patient's diagnosis, an outline of the further diagnostic and laboratory studies which will be required, the recommendation for surgery, and alternatives were discussed with her and her accompanying family members.  All questions were answered to their satisfaction.   I personally had a face to face interaction and evaluated the patient jointly with the NP, Ms. Consuello Masse.   I have reviewed her history and available records and have performed the key portions of the physical exam including pelvic exam with my findings confirming those documented above by the APP.  I have discussed the case with the APP and the patient.  I agree with the above documentation, assessment and plan which was fully formulated by me.  Counseling was completed by me.   I personally saw the patient and performed a substantive portion of this encounter in conjunction with the listed APP as documented above.  Allice Garro Leta Jungling, MD

## 2023-11-15 ENCOUNTER — Encounter: Payer: Self-pay | Admitting: Pain Medicine

## 2023-11-15 ENCOUNTER — Ambulatory Visit
Admission: RE | Admit: 2023-11-15 | Discharge: 2023-11-15 | Disposition: A | Discharge status: Home / Self Care | Payer: Medicare Other | Source: Ambulatory Visit | Attending: Pain Medicine | Admitting: Pain Medicine

## 2023-11-15 ENCOUNTER — Telehealth: Payer: Self-pay

## 2023-11-15 ENCOUNTER — Ambulatory Visit: Payer: Medicare Other | Attending: Pain Medicine | Admitting: Pain Medicine

## 2023-11-15 VITALS — BP 131/79 | HR 69 | Temp 97.5°F | Resp 15 | Ht 64.0 in | Wt 240.0 lb

## 2023-11-15 DIAGNOSIS — R937 Abnormal findings on diagnostic imaging of other parts of musculoskeletal system: Secondary | ICD-10-CM | POA: Insufficient documentation

## 2023-11-15 DIAGNOSIS — M5137 Other intervertebral disc degeneration, lumbosacral region with discogenic back pain only: Secondary | ICD-10-CM | POA: Diagnosis not present

## 2023-11-15 DIAGNOSIS — M5416 Radiculopathy, lumbar region: Secondary | ICD-10-CM | POA: Insufficient documentation

## 2023-11-15 DIAGNOSIS — G8929 Other chronic pain: Secondary | ICD-10-CM | POA: Insufficient documentation

## 2023-11-15 DIAGNOSIS — M545 Low back pain, unspecified: Secondary | ICD-10-CM | POA: Diagnosis not present

## 2023-11-15 DIAGNOSIS — Z888 Allergy status to other drugs, medicaments and biological substances status: Secondary | ICD-10-CM | POA: Insufficient documentation

## 2023-11-15 DIAGNOSIS — M51379 Other intervertebral disc degeneration, lumbosacral region without mention of lumbar back pain or lower extremity pain: Secondary | ICD-10-CM | POA: Insufficient documentation

## 2023-11-15 MED ORDER — ROPIVACAINE HCL 2 MG/ML IJ SOLN
INTRAMUSCULAR | Status: AC
  Filled 2023-11-15: qty 20

## 2023-11-15 MED ORDER — FENTANYL CITRATE (PF) 100 MCG/2ML IJ SOLN
INTRAMUSCULAR | Status: AC
  Filled 2023-11-15: qty 2

## 2023-11-15 MED ORDER — TRIAMCINOLONE ACETONIDE 40 MG/ML IJ SUSP
INTRAMUSCULAR | Status: AC
  Filled 2023-11-15: qty 1

## 2023-11-15 MED ORDER — ROPIVACAINE HCL 2 MG/ML IJ SOLN
2.0000 mL | Freq: Once | INTRAMUSCULAR | Status: AC
Start: 2023-11-15 — End: 2023-11-15
  Administered 2023-11-15: 2 mL via EPIDURAL

## 2023-11-15 MED ORDER — MIDAZOLAM HCL 5 MG/5ML IJ SOLN
INTRAMUSCULAR | Status: AC
  Filled 2023-11-15: qty 5

## 2023-11-15 MED ORDER — LACTATED RINGERS IV SOLN
Freq: Once | INTRAVENOUS | Status: AC
Start: 2023-11-15 — End: 2023-11-15

## 2023-11-15 MED ORDER — LIDOCAINE HCL 2 % IJ SOLN
20.0000 mL | Freq: Once | INTRAMUSCULAR | Status: AC
  Administered 2023-11-15: 100 mg

## 2023-11-15 MED ORDER — IOHEXOL 180 MG/ML  SOLN: 10.0000 mL | Freq: Once | INTRAMUSCULAR | Status: DC

## 2023-11-15 MED ORDER — SODIUM CHLORIDE 0.9% FLUSH
2.0000 mL | Freq: Once | INTRAVENOUS | Status: AC
  Administered 2023-11-15: 2 mL

## 2023-11-15 MED ORDER — FENTANYL CITRATE (PF) 100 MCG/2ML IJ SOLN
25.0000 ug | INTRAMUSCULAR | Status: DC | PRN
  Administered 2023-11-15: 25 ug via INTRAVENOUS

## 2023-11-15 MED ORDER — SODIUM CHLORIDE (PF) 0.9 % IJ SOLN
INTRAMUSCULAR | Status: AC
Start: 2023-11-15 — End: ?
  Filled 2023-11-15: qty 10

## 2023-11-15 MED ORDER — MIDAZOLAM HCL 5 MG/5ML IJ SOLN
0.5000 mg | Freq: Once | INTRAMUSCULAR | Status: AC
  Administered 2023-11-15: 2 mg via INTRAVENOUS

## 2023-11-15 MED ORDER — LIDOCAINE HCL (PF) 2 % IJ SOLN
INTRAMUSCULAR | Status: AC
Start: 2023-11-15 — End: ?
  Filled 2023-11-15: qty 10

## 2023-11-15 MED ORDER — TRIAMCINOLONE ACETONIDE 40 MG/ML IJ SUSP
40.0000 mg | Freq: Once | INTRAMUSCULAR | Status: AC
  Administered 2023-11-15: 40 mg

## 2023-11-15 MED ORDER — PENTAFLUOROPROP-TETRAFLUOROETH EX AERO
INHALATION_SPRAY | Freq: Once | CUTANEOUS | Status: AC
Start: 2023-11-15 — End: 2023-11-15
  Administered 2023-11-15: 30 via TOPICAL

## 2023-11-15 NOTE — Patient Instructions (Signed)

## 2023-11-15 NOTE — Progress Notes (Signed)
PROVIDER NOTE: Interpretation of information contained herein should be left to medically-trained personnel. Specific patient instructions are provided elsewhere under "Patient Instructions" section of medical record. This document was created in part using STT-dictation technology, any transcriptional errors that may result from this process are unintentional.  Patient: Bailey Brooks Type: Established DOB: 1951-10-08 MRN: [id number] PCP: Glori Luis, MD  Service: Procedure DOS: 11/15/2023 Setting: Ambulatory Location: Ambulatory outpatient facility Delivery: Face-to-face Provider: Oswaldo Done, MD Specialty: Interventional Pain Management Specialty designation: 09 Location: Outpatient facility Ref. Prov.: Glori Luis, MD       Interventional Therapy   Type: Lumbar epidural steroid injection (LESI) (interlaminar) #1   (w/o epidural local anesthetics) Laterality: Right   Level:  L2-3 Level.  Imaging: Fluoroscopic guidance Spinal ([id number]) Anesthesia: Local anesthesia (1-2% Lidocaine) Anxiolysis: IV Versed 2.0 mg Sedation: Moderate Sedation Fentanyl 0.5 mL (25 mcg) DOS: 11/15/2023  Performed by: Oswaldo Done, MD  Purpose: Diagnostic/Therapeutic Indications: Lumbar radicular pain of intraspinal etiology of more than 4 weeks that has failed to respond to conservative therapy and is severe enough to impact quality of life or function. 1. Chronic low back pain (1ry area of Pain) (Bilateral) (R>L) w/o sciatica   2. Lumbar radicular pain   3. Abnormal MRI, lumbar spine (11/08/2023)   4. Degeneration of intervertebral disc of lumbosacral region with discogenic back pain    NAS-11 Pain score:   Pre-procedure: 7 /10   Post-procedure: 0-No pain/10      Position / Prep / Materials:  Position: Prone w/ head of the table raised (slight reverse trendelenburg) to facilitate breathing.  Prep solution: ChloraPrep (2% chlorhexidine gluconate and 70% isopropyl  alcohol) Prep Area: Entire Posterior Lumbar Region from lower scapular tip down to mid buttocks area and from flank to flank. Materials:  Tray: Epidural tray Needle(s):  Type: Epidural needle (Tuohy) Gauge (G):  17 Length: Regular (3.5-in) Qty: 1   H&P (Pre-op Assessment):  Bailey Brooks is a 72 y.o. (year old), female patient, seen today for interventional treatment. She  has a past surgical history that includes Tonsillectomy (1954); Limbal stem cell transplant (2012); Liver biopsy; Ines Bloomer hole w/ placement ommaya reservoir; Abdominal hysterectomy (1985); Vulvectomy; Shoulder arthroscopy (Right, 06/22/2015); Insertion central venous access device w/ subcutaneous port (2011); Vulvectomy partial (N/A, 09/29/2015); Colonoscopy (04/2013); Esophagogastroduodenoscopy (04/2013); Burr hole w/ placement ommaya reservoir (2012); Rotator cuff repair (Right, 2016); Injection knee (07/02/2018); Partial mastectomy with needle localization and axillary sentinel lymph node bx (Right, 10/16/2019); Breast biopsy (Right, 2002); Breast biopsy (Right, 2020); Breast lumpectomy (Right, 10/16/2019); and Reverse shoulder arthroplasty (Left, 11/14/2022). Bailey Brooks has a current medication list which includes the following prescription(s): acetaminophen, anastrozole, aspirin ec, atorvastatin, cholecalciferol, diazepam, ezetimibe, hydrochlorothiazide, ketoconazole, lansoprazole, levothyroxine, lisinopril, mometasone, multivitamin, naproxen, specialty vitamins products, and tacrolimus, and the following Facility-Administered Medications: fentanyl. Her primarily concern today is the Back Pain (lower)  Initial Vital Signs:  Pulse/HCG Rate: 69ECG Heart Rate: 68 (nsr) Temp: (!) 97.5 F (36.4 C) Resp: 18 BP: (!) 156/69 SpO2: 98 %  BMI: Estimated body mass index is 41.2 kg/m as calculated from the following:   Height as of this encounter: 5\' 4"  (1.626 m).   Weight as of this encounter: 240 lb (108.9 kg).  Risk  Assessment: Allergies: Reviewed. She is allergic to morphine and codeine, sulfa antibiotics, iodine, adhesive [tape], erythromycin, and oxycodone.  Allergy Precautions: None required Coagulopathies: Reviewed. None identified.  Blood-thinner therapy: None at this time Active Infection(s): Reviewed. None identified. Bailey Brooks is  afebrile  Site Confirmation: Bailey Brooks was asked to confirm the procedure and laterality before marking the site Procedure checklist: Completed Consent: Before the procedure and under the influence of no sedative(s), amnesic(s), or anxiolytics, the patient was informed of the treatment options, risks and possible complications. To fulfill our ethical and legal obligations, as recommended by the American Medical Association's Code of Ethics, I have informed the patient of my clinical impression; the nature and purpose of the treatment or procedure; the risks, benefits, and possible complications of the intervention; the alternatives, including doing nothing; the risk(s) and benefit(s) of the alternative treatment(s) or procedure(s); and the risk(s) and benefit(s) of doing nothing. The patient was provided information about the general risks and possible complications associated with the procedure. These may include, but are not limited to: failure to achieve desired goals, infection, bleeding, organ or nerve damage, allergic reactions, paralysis, and death. In addition, the patient was informed of those risks and complications associated to Spine-related procedures, such as failure to decrease pain; infection (i.e.: Meningitis, epidural or intraspinal abscess); bleeding (i.e.: epidural hematoma, subarachnoid hemorrhage, or any other type of intraspinal or peri-dural bleeding); organ or nerve damage (i.e.: Any type of peripheral nerve, nerve root, or spinal cord injury) with subsequent damage to sensory, motor, and/or autonomic systems, resulting in permanent pain, numbness,  and/or weakness of one or several areas of the body; allergic reactions; (i.e.: anaphylactic reaction); and/or death. Furthermore, the patient was informed of those risks and complications associated with the medications. These include, but are not limited to: allergic reactions (i.e.: anaphylactic or anaphylactoid reaction(s)); adrenal axis suppression; blood sugar elevation that in diabetics may result in ketoacidosis or comma; water retention that in patients with history of congestive heart failure may result in shortness of breath, pulmonary edema, and decompensation with resultant heart failure; weight gain; swelling or edema; medication-induced neural toxicity; particulate matter embolism and blood vessel occlusion with resultant organ, and/or nervous system infarction; and/or aseptic necrosis of one or more joints. Finally, the patient was informed that Medicine is not an exact science; therefore, there is also the possibility of unforeseen or unpredictable risks and/or possible complications that may result in a catastrophic outcome. The patient indicated having understood very clearly. We have given the patient no guarantees and we have made no promises. Enough time was given to the patient to ask questions, all of which were answered to the patient's satisfaction. Ms. Bartch has indicated that she wanted to continue with the procedure. Attestation: I, the ordering provider, attest that I have discussed with the patient the benefits, risks, side-effects, alternatives, likelihood of achieving goals, and potential problems during recovery for the procedure that I have provided informed consent. Date  Time: 11/15/2023  9:33 AM   Pre-Procedure Preparation:  Monitoring: As per clinic protocol. Respiration, ETCO2, SpO2, BP, heart rate and rhythm monitor placed and checked for adequate function Safety Precautions: Patient was assessed for positional comfort and pressure points before starting the  procedure. Time-out: I initiated and conducted the "Time-out" before starting the procedure, as per protocol. The patient was asked to participate by confirming the accuracy of the "Time Out" information. Verification of the correct person, site, and procedure were performed and confirmed by me, the nursing staff, and the patient. "Time-out" conducted as per Joint Commission's Universal Protocol (UP.01.01.01). Time: 1113 Start Time: 1113 hrs.  Description/Narrative of Procedure:          Target: Epidural space via interlaminar opening, initially targeting the lower laminar border  of the superior vertebral body. Region: Lumbar Approach: Percutaneous paravertebral  Rationale (medical necessity): procedure needed and proper for the diagnosis and/or treatment of the patient's medical symptoms and needs. Procedural Technique Safety Precautions: Aspiration looking for blood return was conducted prior to all injections. At no point did we inject any substances, as a needle was being advanced. No attempts were made at seeking any paresthesias. Safe injection practices and needle disposal techniques used. Medications properly checked for expiration dates. SDV (single dose vial) medications used. Description of the Procedure: Protocol guidelines were followed. The procedure needle was introduced through the skin, ipsilateral to the reported pain, and advanced to the target area. Bone was contacted and the needle walked caudad, until the lamina was cleared. The epidural space was identified using "loss-of-resistance technique" with 2-3 ml of PF-NaCl (0.9% NSS), in a 5cc LOR glass syringe.  Vitals:   11/15/23 1122 11/15/23 1126 11/15/23 1136 11/15/23 1146  BP: 138/76 130/81 127/81 131/79  Pulse:      Resp: 15 17 16 15   Temp:      TempSrc:      SpO2: 95% 97% 98% 99%  Weight:      Height:        Start Time: 1113 hrs. End Time: 1125 hrs.  Imaging Guidance (Spinal):          Type of Imaging Technique:  Fluoroscopy Guidance (Spinal) Indication(s): Fluoroscopy guidance for needle placement to enhance accuracy in procedures requiring precise needle localization for targeted delivery of medication in or near specific anatomical locations not easily accessible without such real-time imaging assistance. Exposure Time: Please see nurses notes. Contrast: None used. Fluoroscopic Guidance: I was personally present during the use of fluoroscopy. "Tunnel Vision Technique" used to obtain the best possible view of the target area. Parallax error corrected before commencing the procedure. "Direction-depth-direction" technique used to introduce the needle under continuous pulsed fluoroscopy. Once target was reached, antero-posterior, oblique, and lateral fluoroscopic projection used confirm needle placement in all planes. Images permanently stored in EMR. Interpretation: No contrast injected. I personally interpreted the imaging intraoperatively. Adequate needle placement confirmed in multiple planes. Permanent images saved into the patient's record.  Antibiotic Prophylaxis:   Anti-infectives (From admission, onward)    None      Indication(s): None identified  Post-operative Assessment:  Post-procedure Vital Signs:  Pulse/HCG Rate: 6962 Temp: (!) 97.5 F (36.4 C) Resp: 15 BP: 131/79 SpO2: 99 %  EBL: None  Complications: No immediate post-treatment complications observed by team, or reported by patient.  Note: The patient tolerated the entire procedure well. A repeat set of vitals were taken after the procedure and the patient was kept under observation following institutional policy, for this type of procedure. Post-procedural neurological assessment was performed, showing return to baseline, prior to discharge. The patient was provided with post-procedure discharge instructions, including a section on how to identify potential problems. Should any problems arise concerning this procedure, the  patient was given instructions to immediately contact us, at any time, without hesitation. In any case, we plan to contact the patient by telephone for a follow-up status report regarding this interventional procedure.  Comments:  No additional relevant information.  Plan of Care (POC)  Orders:  Orders Placed This Encounter  Procedures   Lumbar Epidural Injection    Scheduling Instructions:     Procedure: Interlaminar LESI L2-3     Laterality: Right     Sedation: Patient's choice     Timeframe: Today    Where  will this procedure be performed?:   [facility]   [facility] C-ARM 1-60 MIN NO REPORT    Intraoperative interpretation by procedural physician at [facility].    Standing Status:   Standing    Number of Occurrences:   1    Reason for exam::   Assistance in needle guidance and placement for procedures requiring needle placement in or near specific anatomical locations not easily accessible without such assistance.   Informed Consent Details: Physician/Practitioner Attestation; Transcribe to consent form and obtain patient signature    Note: Always confirm laterality of pain with Ms. Jenne Campus, before procedure. Transcribe to consent form and obtain patient signature.    Physician/Practitioner attestation of informed consent for procedure/surgical case:   I, the physician/practitioner, attest that I have discussed with the patient the benefits, risks, side effects, alternatives, likelihood of achieving goals and potential problems during recovery for the procedure that I have provided informed consent.    Procedure:   Lumbar epidural steroid injection under fluoroscopic guidance    Physician/Practitioner performing the procedure:   Ed Rayson A. Laban Emperor, MD    Indication/Reason:   Low back and/or lower extremity pain secondary to lumbar radiculitis   Provide equipment / supplies at bedside    Procedural tray: Epidural Tray (Disposable  single use) Skin  infiltration needle: Regular 1.5-in, 25-G, (x1) Block needle size: Regular standard Catheter: No catheter required    Standing Status:   Standing    Number of Occurrences:   1    Specify:   Epidural Tray   Chronic Opioid Analgesic:   None MME/day: 0 mg/day   Medications ordered for procedure: Meds ordered this encounter  Medications   DISCONTD: iohexol (OMNIPAQUE) 180 MG/ML injection 10 mL    Must be Myelogram-compatible. If not available, you may substitute with a water-soluble, non-ionic, hypoallergenic, myelogram-compatible radiological contrast medium.   lidocaine (XYLOCAINE) 2 % (with pres) injection 400 mg   pentafluoroprop-tetrafluoroeth (GEBAUERS) aerosol   lactated ringers infusion   midazolam (VERSED) 5 MG/5ML injection 0.5-2 mg    Make sure Flumazenil is available in the pyxis when using this medication. If oversedation occurs, administer 0.2 mg IV over 15 sec. If after 45 sec no response, administer 0.2 mg again over 1 min; may repeat at 1 min intervals; not to exceed 4 doses (1 mg)   fentaNYL (SUBLIMAZE) injection 25-50 mcg    Make sure Narcan is available in the pyxis when using this medication. In the event of respiratory depression (RR< 8/min): Titrate NARCAN (naloxone) in increments of 0.1 to 0.2 mg IV at 2-3 minute intervals, until desired degree of reversal.   sodium chloride flush (NS) 0.9 % injection 2 mL   ropivacaine (PF) 2 mg/mL (0.2%) (NAROPIN) injection 2 mL   triamcinolone acetonide (KENALOG-40) injection 40 mg   Medications administered: We administered lidocaine, pentafluoroprop-tetrafluoroeth, lactated ringers, midazolam, fentaNYL, sodium chloride flush, ropivacaine (PF) 2 mg/mL (0.2%), and triamcinolone acetonide.  See the medical record for exact dosing, route, and time of administration.  Follow-up plan:   Return in about 2 weeks (around 11/29/2023) for (Face2F), (PPE).       Interventional Therapies  Risk Factors  Considerations  Medical  Comorbidities:  History of breast cancer and lymphoma     Planned  Pending:  Diagnosis/therapeutic midline L3-4 LESI #1 Diagnostic Lumbar spine MRI    Under consideration:   Diagnostic/therapeutic midline L3-4 LESI #1  Diagnostic/therapeutic bilateral IA hip joint injection #1    Completed:  None at this time   Therapeutic  Palliative (PRN) options:   None established   Completed by other providers:   (07/24/2022) left shoulder injection by Poggi, Thalia Bloodgood, MD         Recent Visits Date Type Provider Dept  10/24/23 Office Visit Delano Metz, MD [facility]  10/01/23 Office Visit Delano Metz, MD [facility]  Showing recent visits within past 90 days and meeting all other requirements Today's Visits Date Type Provider Dept  11/15/23 Procedure visit Delano Metz, MD [facility]  Showing today's visits and meeting all other requirements Future Appointments Date Type Provider Dept  12/04/23 Appointment Delano Metz, MD [facility]  Showing future appointments within next 90 days and meeting all other requirements  Disposition: Discharge home  Discharge (Date  Time): 11/15/2023; 1152 hrs.   Primary Care Physician: Glori Luis, MD Location: [facility] Note by: Oswaldo Done, MD (TTS technology used. I apologize for any typographical errors that were not detected and corrected.) Date: 11/15/2023; Time: 12:21 PM  Disclaimer:  Medicine is not an Visual merchandiser. The only guarantee in medicine is that nothing is guaranteed. It is important to note that the decision to proceed with this intervention was based on the information collected from the patient. The Data and conclusions were drawn from the patient's questionnaire, the interview, and the physical examination. Because the information was provided in large part by the patient, it cannot be guaranteed that it has  not been purposely or unconsciously manipulated. Every effort has been made to obtain as much relevant data as possible for this evaluation. It is important to note that the conclusions that lead to this procedure are derived in large part from the available data. Always take into account that the treatment will also be dependent on availability of resources and existing treatment guidelines, considered by other Pain Management Practitioners as being common knowledge and practice, at the time of the intervention. For Medico-Legal purposes, it is also important to point out that variation in procedural techniques and pharmacological choices are the acceptable norm. The indications, contraindications, technique, and results of the above procedure should only be interpreted and judged by a Board-Certified Interventional Pain Specialist with extensive familiarity and expertise in the same exact procedure and technique.

## 2023-11-16 ENCOUNTER — Telehealth: Payer: Self-pay

## 2023-11-16 NOTE — Telephone Encounter (Signed)
Post procedure follow up.  PAtient states she is doing fine

## 2023-12-04 ENCOUNTER — Encounter: Payer: Self-pay | Admitting: Pain Medicine

## 2023-12-04 ENCOUNTER — Ambulatory Visit
Admission: RE | Admit: 2023-12-04 | Discharge: 2023-12-04 | Disposition: A | Discharge status: Home / Self Care | Payer: Medicare Other | Source: Ambulatory Visit | Attending: Pain Medicine | Admitting: Pain Medicine

## 2023-12-04 ENCOUNTER — Ambulatory Visit: Payer: Medicare Other | Admitting: Pain Medicine

## 2023-12-04 VITALS — BP 123/80 | HR 88 | Temp 97.2°F | Resp 16 | Ht 63.0 in | Wt 240.0 lb

## 2023-12-04 DIAGNOSIS — M25652 Stiffness of left hip, not elsewhere classified: Secondary | ICD-10-CM

## 2023-12-04 DIAGNOSIS — R1031 Right lower quadrant pain: Secondary | ICD-10-CM | POA: Insufficient documentation

## 2023-12-04 DIAGNOSIS — M25562 Pain in left knee: Secondary | ICD-10-CM | POA: Insufficient documentation

## 2023-12-04 DIAGNOSIS — M25651 Stiffness of right hip, not elsewhere classified: Secondary | ICD-10-CM | POA: Insufficient documentation

## 2023-12-04 DIAGNOSIS — M545 Low back pain, unspecified: Secondary | ICD-10-CM

## 2023-12-04 DIAGNOSIS — M160 Bilateral primary osteoarthritis of hip: Secondary | ICD-10-CM | POA: Diagnosis not present

## 2023-12-04 DIAGNOSIS — M25551 Pain in right hip: Secondary | ICD-10-CM

## 2023-12-04 DIAGNOSIS — M5459 Other low back pain: Secondary | ICD-10-CM

## 2023-12-04 DIAGNOSIS — M47816 Spondylosis without myelopathy or radiculopathy, lumbar region: Secondary | ICD-10-CM | POA: Insufficient documentation

## 2023-12-04 DIAGNOSIS — Z09 Encounter for follow-up examination after completed treatment for conditions other than malignant neoplasm: Secondary | ICD-10-CM

## 2023-12-04 DIAGNOSIS — M25552 Pain in left hip: Secondary | ICD-10-CM | POA: Diagnosis not present

## 2023-12-04 DIAGNOSIS — M47817 Spondylosis without myelopathy or radiculopathy, lumbosacral region: Secondary | ICD-10-CM | POA: Insufficient documentation

## 2023-12-04 DIAGNOSIS — M4726 Other spondylosis with radiculopathy, lumbar region: Secondary | ICD-10-CM

## 2023-12-04 DIAGNOSIS — G8929 Other chronic pain: Secondary | ICD-10-CM

## 2023-12-04 DIAGNOSIS — M5416 Radiculopathy, lumbar region: Secondary | ICD-10-CM | POA: Insufficient documentation

## 2023-12-04 DIAGNOSIS — M25561 Pain in right knee: Secondary | ICD-10-CM | POA: Insufficient documentation

## 2023-12-04 NOTE — Progress Notes (Signed)
Safety precautions to be maintained throughout the outpatient stay will include: orient to surroundings, keep bed in low position, maintain call bell within reach at all times, provide assistance with transfer out of bed and ambulation.  

## 2023-12-04 NOTE — Progress Notes (Addendum)
 PROVIDER NOTE: Information contained herein reflects review and annotations entered in association with encounter. Interpretation of such information and data should be left to medically-trained personnel. Information provided to patient can be located elsewhere in the medical record under Patient Instructions. Document created using STT-dictation technology, any transcriptional errors that may result from process are unintentional.    Patient: Bailey Brooks  Service Category: E/M  Provider: Eric DELENA Como, MD  DOB: [birth year]  DOS: 12/04/2023  Referring Provider: Maribeth Camellia MATSU, MD  MRN: [id number]  Specialty: Interventional Pain Management  PCP: Maribeth Camellia MATSU, MD  Type: Established Patient  Setting: Ambulatory outpatient    Location: Office  Delivery: Face-to-face     HPI  Bailey Brooks, a 72 y.o. year old female, is here today because of her Chronic bilateral low back pain without sciatica [M54.50, G89.29]. 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; Lumbar spondylosis; Rotator cuff tear; Status post reverse arthroplasty of shoulder, left; Rotator cuff arthropathy, left; Chronic pain syndrome; Chronic groin pain  (2ry area of Pain) (Right); History of breast cancer; Chronic groin pain (Left); Spinal stenosis, lumbar region, with neurogenic claudication; Chronic shoulder pain (5th area of Pain) (Bilateral); Lumbar radicular pain; Abnormal MRI, lumbar spine (11/08/2023); DDD (degenerative disc disease), lumbosacral; Lumbar facet  joint pain; Lumbar facet joint syndrome (Bilateral) (R>L); Lumbar facet arthropathy (Multilevel) (Bilateral); and Spondylosis without myelopathy or radiculopathy, lumbosacral region on their pertinent problem list. Pain Assessment: Severity of Chronic pain is reported as a 8 /10. Location: Back Lower, Right, Left/Into right groin into top of right thigh into right knee. Onset: More than a month ago. Quality: Constant, Aching, Throbbing. Timing: Constant. Modifying factor(s): Sitting, Tylenol , and Naproxen . Vitals:  height is 5' 3 (1.6 m) and weight is 240 lb (108.9 kg). Her temporal temperature is 97.2 F (36.2 C) (abnormal). Her blood pressure is 123/80 and her pulse is 88. Her respiration is 16 and oxygen saturation is 98%.  BMI: Estimated body mass index is 42.51 kg/m as calculated from the following:   Height as of this encounter: 5' 3 (1.6 m).   Weight as of this encounter: 240 lb (108.9 kg). Last encounter: 10/24/2023. Last procedure: 11/15/2023.  Reason for encounter: post-procedure evaluation and assessment.  Discussed the use of AI scribe software for clinical note transcription with the patient, who gave verbal consent to proceed.  History of Present Illness   The patient presents with persistent lower back pain and right leg pain. The back pain is bilateral and does not occur when the patient is seated. Upon standing or walking, the pain intensifies and is described as being across the lower back. The patient also experiences pain in the right leg, specifically in the groin and anterior thigh area, which extends to the knee. This leg pain is most noticeable when walking or standing. The patient denies any numbness or weakness in the leg.  The patient has significant difficulty with certain movements, such as crossing one leg over the other or swiveling the hips, due to pain. The left leg does not present with similar symptoms. The  patient also reports shoulder problems, which limit her  ability to reach.  The patient's sleep is affected by these symptoms, with the right leg and groin pain sometimes causing her to wake up. Upon waking, the back pain is reportedly less severe initially but worsens as the day progresses. The patient has not undergone any hip surgeries in the past.  An MRI of the lumbar spine revealed multilevel lumbar disc and facet degeneration, most notable at the L4, 5 level, with moderate spinal and left neural foraminal stenosis. However, the patient's primary leg pain is on the right side. The patient has previously undergone an epidural treatment for the back pain, which did not provide significant relief.       Post-procedure evaluation   Type: Lumbar epidural steroid injection (LESI) (interlaminar) #1   (w/o epidural local anesthetics) Laterality: Right   Level:  L2-3 Level.  Imaging: Fluoroscopic guidance Spinal ([id number]) Anesthesia: Local anesthesia (1-2% Lidocaine ) Anxiolysis: IV Versed  2.0 mg Sedation: Moderate Sedation Fentanyl  0.5 mL (25 mcg) DOS: 11/15/2023  Performed by: Eric DELENA Como, MD  Purpose: Diagnostic/Therapeutic Indications: Lumbar radicular pain of intraspinal etiology of more than 4 weeks that has failed to respond to conservative therapy and is severe enough to impact quality of life or function. 1. Chronic low back pain (1ry area of Pain) (Bilateral) (R>L) w/o sciatica   2. Lumbar radicular pain   3. Abnormal MRI, lumbar spine (11/08/2023)   4. Degeneration of intervertebral disc of lumbosacral region with discogenic back pain    NAS-11 Pain score:   Pre-procedure: 7 /10   Post-procedure: 0-No pain/10     Effectiveness:  Initial hour after procedure: 100 %. Subsequent 4-6 hours post-procedure: 90 %. Analgesia past initial 6 hours: 0 %. Ongoing improvement:  Analgesic: The patient indicates no long-term benefit from diagnostic right sided L2-3 LESI. Function: No benefit ROM: No benefit  Pharmacotherapy  Assessment  Analgesic: None MME/day: 0 mg/day   Monitoring: [place] PMP: PDMP reviewed during this encounter.       Pharmacotherapy: No side-effects or adverse reactions reported. Compliance: No problems identified. Effectiveness: Clinically acceptable.  Bonner Norris, RN  12/04/2023  2:15 PM  Signed Safety precautions to be maintained throughout the outpatient stay will include: orient to surroundings, keep bed in low position, maintain call bell within reach at all times, provide assistance with transfer out of bed and ambulation.     No results found for: CBDTHCR No results found for: D8THCCBX No results found for: D9THCCBX  UDS:  No results found for: SUMMARY    ROS  Constitutional: Denies any fever or chills Gastrointestinal: No reported hemesis, hematochezia, vomiting, or acute GI distress Musculoskeletal: Denies any acute onset joint swelling, redness, loss of ROM, or weakness Neurological: No reported episodes of acute onset apraxia, aphasia, dysarthria, agnosia, amnesia, paralysis, loss of coordination, or loss of consciousness  Medication Review  Specialty Vitamins Products, acetaminophen , anastrozole , aspirin  EC, atorvastatin , cholecalciferol , diazepam , ezetimibe , hydrochlorothiazide , ketoconazole , lansoprazole , levothyroxine , lisinopril , mometasone , multivitamin, naproxen , and tacrolimus   History Review  Allergy: Bailey Brooks is allergic to morphine and codeine, sulfa antibiotics, iodine, adhesive [tape], erythromycin, and oxycodone . Drug: Bailey Brooks  reports no history of drug use. Alcohol:  reports no history of alcohol use. Tobacco:  reports that she has never smoked. She has never used smokeless tobacco. Social: Bailey Brooks  reports that she has never smoked. She has never used smokeless tobacco. She reports that she does not drink alcohol and does not use drugs. 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 Surgical: Bailey Brooks  has a past surgical history that includes Tonsillectomy (1954); Limbal stem cell transplant (2012); Liver biopsy; Solmon hole w/ placement ommaya reservoir; Abdominal hysterectomy (1985); Vulvectomy; Shoulder arthroscopy (Right, 06/22/2015); Insertion central venous access device w/ subcutaneous port (2011); Vulvectomy partial (N/A, 09/29/2015); Colonoscopy (04/2013); Esophagogastroduodenoscopy (04/2013); Burr hole w/ placement ommaya reservoir (2012); Rotator cuff repair (Right, 2016); Injection knee (07/02/2018); Partial mastectomy with needle localization and axillary sentinel lymph node bx (Right, 10/16/2019); Breast biopsy (Right, 2002); Breast biopsy (Right, 2020); Breast lumpectomy (Right, 10/16/2019); and Reverse shoulder arthroplasty (Left, 11/14/2022). Family: family history includes Breast cancer (age of onset: 33) in her sister; Cancer in her maternal aunt; Diabetes in her mother; Hearing loss in her maternal aunt and maternal grandfather; Heart attack in her mother; Heart disease (age of onset: 38) in her brother; Hypertension in her mother; Kidney cancer (age of onset: 68) in her niece; Kidney disease in her sister; Melanoma (age of onset: 62)  in her father; Other in her maternal grandmother; Prostate cancer in her brother.  Laboratory Chemistry Profile   Renal Lab Results  Component Value Date   BUN 24 (H) 08/24/2023   CREATININE 0.89 08/24/2023   GFR 64.25 08/24/2023   GFRAA >60 07/05/2020   GFRNONAA 44 (L) 06/05/2023    Hepatic Lab Results  Component Value Date   AST 27 11/06/2022   ALT 22 11/06/2022   ALBUMIN 3.8 11/06/2022   ALKPHOS 94 11/06/2022   LIPASE 93 09/03/2013    Electrolytes Lab Results  Component Value Date   NA 139 08/24/2023   K 4.2 08/24/2023   CL 104 08/24/2023   CALCIUM  9.6 08/24/2023   MG 1.9 06/12/2018    Bone Lab Results  Component Value Date   VD25OH 59.67 11/23/2014    Inflammation (CRP: Acute Phase) (ESR: Chronic Phase) No results found for: CRP, ESRSEDRATE, LATICACIDVEN       Note: Above Lab results reviewed.  Recent Imaging Review  DG PAIN CLINIC C-ARM 1-60 MIN NO REPORT Fluoro was used, but no Radiologist interpretation will be provided.  Please refer to NOTES tab for provider progress note. Note: Reviewed        Physical Exam  General appearance: Well nourished, well developed, and well hydrated. In no apparent acute distress Mental status: Alert, oriented x 3 (person, place, & time)       Respiratory: No evidence of acute respiratory distress Eyes: PERLA Vitals: BP 123/80 (Cuff Size: Normal)   Pulse 88   Temp (!) 97.2 F (36.2 C) (Temporal)   Resp 16   Ht 5' 3 (1.6 m)   Wt 240 lb (108.9 kg)   SpO2 98%   BMI 42.51 kg/m  BMI: Estimated body mass index is 42.51 kg/m as calculated from the following:   Height as of this encounter: 5' 3 (1.6 m).   Weight as of this encounter: 240 lb (108.9  kg). Ideal: Ideal body weight: 52.4 kg (115 lb 8.3 oz) Adjusted ideal body weight: 75 kg (165 lb 5 oz)  Physical Exam   MUSCULOSKELETAL: Significant decreased range of motion in the right hip with pain upon attempted movement. Pain elicited upon crossing the right  leg over the left leg, with reported pain in the groin area and pain over the right hip. Slightly better range of motion in the left leg but still limited.       Assessment   Diagnosis Status  1. Chronic low back pain (1ry area of Pain) (Bilateral) (R>L) w/o sciatica   2. Lumbar radicular pain   3. Chronic groin pain  (2ry area of Pain) (Right)   4. Chronic hip pain (3ry area of Pain) (Right)   5. Chronic knee pain (4th area of Pain) (Bilateral) (R>L)   6. Postop check   7. Lumbar facet joint pain   8. Lumbar facet joint syndrome (Bilateral) (R>L)   9. Lumbar facet arthropathy (Multilevel) (Bilateral)   10. Spondylosis without myelopathy or radiculopathy, lumbosacral region   11. Impaired range of motion of right hip   12. Decreased range of motion of both hips    Persistent Resolved Persistent   Updated Problems: Problem  Lumbar Facet Joint Pain  Lumbar facet joint syndrome (Bilateral) (R>L)  Lumbar facet arthropathy (Multilevel) (Bilateral)  Spondylosis Without Myelopathy Or Radiculopathy, Lumbosacral Region  Impaired Range of Motion of Right Hip  Decreased Range of Motion of Both Hips    Plan of Care  Problem-specific:  Assessment and Plan    Right Hip Osteoarthritis   She exhibits significant decreased range of motion and pain in the right hip, especially during the Patrick maneuver, with pain localized to the groin and anterior thigh, not extending below the knee, suggesting osteoarthritis. We will order x-rays of the right hip and schedule a corticosteroid injection for the right hip to alleviate pain and help differentiate between hip and back pain. The procedure, explained to her, involves x-ray guidance, similar to her previous knee injections, with an anticipated outcome of pain relief confirming the hip as the pain source. Risks include temporary discomfort and a rare potential for infection.  Lumbar Facet Joint Osteoarthritis   She reports chronic lower back pain,  bilateral, worsened by standing and walking but relieved by sitting. An MRI from 10/25/2023 shows multilevel lumbar disc and facet degeneration, notably at L4-5 with moderate spinal and left neural foraminal stenosis. The lack of pain relief from a previous epidural injection suggests a facet joint origin. We discussed proceeding with radiofrequency ablation if facet joint pain is confirmed, a non-surgical procedure targeting nerves outside the spinal canal, expected to significantly relieve pain. Risks include temporary discomfort and potential nerve damage, though uncommon. We will reassess her back pain after addressing the hip pain and plan for radiofrequency ablation for lumbar facet joint pain if necessary.  Follow-up   We will follow up after the hip x-ray and injection to assess pain relief and differentiate between hip and back pain, planning for radiofrequency ablation for lumbar facet joint pain if necessary.       Bailey Brooks has a current medication list which includes the following long-term medication(s): atorvastatin , ezetimibe , hydrochlorothiazide , lansoprazole , levothyroxine , and lisinopril .  Pharmacotherapy (Medications Ordered): No orders of the defined types were placed in this encounter.  Orders:  Orders Placed This Encounter  Procedures   HIP INJECTION    Standing Status:   Future    Expiration  Date:   03/03/2024    Scheduling Instructions:     Procedure: Intra-articular Hip joint injection     Side: Right-sided     Approach: Anterior     Sedation: Patient's choice.     Timeframe: As soon as schedule allows   DG HIPS BILAT W OR W/O PELVIS MIN 5 VIEWS    Standing Status:   Future    Number of Occurrences:   1    Expiration Date:   03/03/2024    Scheduling Instructions:     Please make sure that the patient understands that this needs to be done as soon as possible. Never have the patient do the imaging just before the next appointment. Inform patient that  having the imaging done within the [facility] will expedite the availability of the results and will provide      imaging availability to the requesting physician. In addition inform the patient that the imaging order has an expiration date and will not be renewed if not done within the active period.    Reason for Exam (SYMPTOM  OR DIAGNOSIS REQUIRED):   Chronic bilateral hip pain (M25.551, M25.552, G89.29)    Preferred imaging location?:   [facility]    Release to patient:   Immediate    Call Results- Best Contact Number?:   [phone] [facility]   Nursing Instructions:    Please complete this patient's postprocedure evaluation.    Scheduling Instructions:     Please complete this patient's postprocedure evaluation.   Follow-up plan:   Return for [facility]): (R) IA Hip inj. #1.      Interventional Therapies  Risk Factors  Considerations  Medical Comorbidities:  History of breast cancer and lymphoma     Planned  Pending:  Diagnosis/therapeutic midline L3-4 LESI #1 Diagnostic Lumbar spine MRI    Under consideration:   Diagnostic/therapeutic midline L3-4 LESI #1  Diagnostic/therapeutic bilateral IA hip joint injection #1    Completed:   None at this time   Therapeutic  Palliative (PRN) options:   None established   Completed by other providers:   (07/24/2022) left shoulder injection by Poggi, Norleen Purchase, MD         Recent Visits Date Type Provider Dept  11/15/23 Procedure visit Tanya Glisson, MD [facility]  10/24/23 Office Visit Tanya Glisson, MD [facility]  10/01/23 Office Visit Tanya Glisson, MD [facility]  Showing recent visits within past 90 days and meeting all other requirements Today's Visits Date Type Provider Dept  12/04/23 Office Visit Tanya Glisson, MD [facility]  Showing today's visits and meeting all other  requirements Future Appointments Date Type Provider Dept  12/13/23 Appointment Tanya Glisson, MD [facility]  Showing future appointments within next 90 days and meeting all other requirements  I discussed the assessment and treatment plan with the patient. The patient was provided an opportunity to ask questions and all were answered. The patient agreed with the plan and demonstrated an understanding of the instructions.  Patient advised to call back or seek an in-person evaluation if the symptoms or condition worsens.  Duration of encounter: 51 minutes.  Total time on encounter, as per AMA guidelines included both the face-to-face and non-face-to-face time personally spent by the physician and/or other qualified health care professional(s) on the day of the encounter (includes time in activities that require the physician or other qualified health care professional and does not include  time in activities normally performed by clinical staff). Physician's time may include the following activities when performed: Preparing to see the patient (e.g., pre-charting review of records, searching for previously ordered imaging, lab work, and nerve conduction tests) Review of prior analgesic pharmacotherapies. Reviewing PMP Interpreting ordered tests (e.g., lab work, imaging, nerve conduction tests) Performing post-procedure evaluations, including interpretation of diagnostic procedures Obtaining and/or reviewing separately obtained history Performing a medically appropriate examination and/or evaluation Counseling and educating the patient/family/caregiver Ordering medications, tests, or procedures Referring and communicating with other health care professionals (when not separately reported) Documenting clinical information in the electronic or other health record Independently interpreting results (not separately reported) and communicating results to the patient/  family/caregiver Care coordination (not separately reported)  Note by: Eric DELENA Como, MD Date: 12/04/2023; Time: 2:17 PM

## 2023-12-04 NOTE — Patient Instructions (Signed)

## 2023-12-07 ENCOUNTER — Encounter: Payer: Self-pay | Admitting: Podiatry

## 2023-12-07 ENCOUNTER — Ambulatory Visit: Payer: Medicare Other | Admitting: Podiatry

## 2023-12-07 VITALS — Ht 63.0 in | Wt 240.0 lb

## 2023-12-07 DIAGNOSIS — M79674 Pain in right toe(s): Secondary | ICD-10-CM | POA: Diagnosis not present

## 2023-12-07 DIAGNOSIS — B351 Tinea unguium: Secondary | ICD-10-CM

## 2023-12-07 DIAGNOSIS — M79675 Pain in left toe(s): Secondary | ICD-10-CM | POA: Diagnosis not present

## 2023-12-07 NOTE — Progress Notes (Signed)
  Subjective:  Patient ID: Bailey Brooks, female    DOB: Apr 09, 1951,  MRN: [id number]  73 y.o. female presents to clinic with  painful thick toenails that are difficult to trim. Pain interferes with ambulation. Aggravating factors include wearing enclosed shoe gear. Pain is relieved with periodic professional debridement. Most symptomatic is the right 2nd toe which is tender with shoe pressure. She has avoided wearing any shoes which cause pressure on this digit. Also painful is the right great toe medial border. Chief Complaint  Patient presents with   Nail Problem    Patient is here for RFC   PCP is Maribeth, Camellia MATSU, MD.  Allergies  Allergen Reactions   Morphine And Codeine Nausea And Vomiting and Nausea Only    Hallucinations   Sulfa Antibiotics Other (See Comments)    Dizzy/Fainting   Iodine Swelling    IV iodine (states now that this can be tolerated with Benadryl )   Adhesive [Tape] Rash and Other (See Comments)    Including Bandaids-paper tape ok to use   Erythromycin Nausea And Vomiting   Oxycodone  Nausea And Vomiting    Review of Systems: Negative except as noted in the HPI.   Objective:  Bailey Brooks is a pleasant 73 y.o. female obese in NAD.SABRA AAO x 3.  Vascular Examination: Vascular status intact b/l with palpable pedal pulses. CFT immediate b/l. No edema. No pain with calf compression b/l. Skin temperature gradient WNL b/l. No cyanosis or clubbing noted b/l LE.  Neurological Examination: Sensation grossly intact b/l with 10 gram monofilament. Vibratory sensation intact b/l.   Dermatological Examination: Pedal skin with normal turgor, texture and tone b/l. Toenails 1-5 b/l thick, discolored, elongated with subungual debris and pain on dorsal palpation. No hyperkeratotic lesions noted b/l.   Musculoskeletal Examination: Muscle strength 5/5 to b/l LE. No pain, crepitus or joint limitation noted with ROM bilateral LE. No gross bony deformities  bilaterally.  Radiographs: None  Last A1c:      Latest Ref Rng & Units 01/26/2023    4:11 PM  Hemoglobin A1C  Hemoglobin-A1c 4.0 - 5.6 % 5.8      Assessment:   1. Pain due to onychomycosis of toenails of both feet    Plan:  Patient was evaluated and treated. All patient's and/or POA's questions/concerns addressed on today's visit. Toenails 1-5 debrided in length and girth without incident. Continue soft, supportive shoe gear daily. Right 2nd digit was curretaged and patient noted relief post treatment. Applied tube foam to digit for protection when wearing shoe gear. Report any pedal injuries to medical professional. Call office if there are any questions/concerns. -Patient/POA to call should there be question/concern in the interim.  Return in about 3 months (around 03/06/2024).  Bailey Brooks, DPM      [place] LOCATION: [address] [place] [address]                   Office [phone]   [facility] LOCATION: [address] [place], [place] [address] Office [phone]

## 2023-12-12 DIAGNOSIS — H353111 Nonexudative age-related macular degeneration, right eye, early dry stage: Secondary | ICD-10-CM | POA: Diagnosis not present

## 2023-12-12 DIAGNOSIS — H2513 Age-related nuclear cataract, bilateral: Secondary | ICD-10-CM | POA: Diagnosis not present

## 2023-12-12 DIAGNOSIS — H5203 Hypermetropia, bilateral: Secondary | ICD-10-CM | POA: Diagnosis not present

## 2023-12-12 DIAGNOSIS — H353121 Nonexudative age-related macular degeneration, left eye, early dry stage: Secondary | ICD-10-CM | POA: Diagnosis not present

## 2023-12-13 ENCOUNTER — Ambulatory Visit
Admission: RE | Admit: 2023-12-13 | Discharge: 2023-12-13 | Disposition: A | Discharge status: Home / Self Care | Payer: Medicare Other | Source: Ambulatory Visit | Attending: Pain Medicine | Admitting: Pain Medicine

## 2023-12-13 ENCOUNTER — Ambulatory Visit: Payer: Medicare Other | Attending: Pain Medicine | Admitting: Pain Medicine

## 2023-12-13 ENCOUNTER — Encounter: Payer: Self-pay | Admitting: Pain Medicine

## 2023-12-13 VITALS — BP 142/59 | HR 88 | Temp 97.2°F | Resp 19 | Ht 63.0 in | Wt 240.0 lb

## 2023-12-13 DIAGNOSIS — R4589 Other symptoms and signs involving emotional state: Secondary | ICD-10-CM | POA: Diagnosis present

## 2023-12-13 DIAGNOSIS — Z6841 Body Mass Index (BMI) 40.0 and over, adult: Secondary | ICD-10-CM | POA: Diagnosis present

## 2023-12-13 DIAGNOSIS — M25551 Pain in right hip: Secondary | ICD-10-CM | POA: Insufficient documentation

## 2023-12-13 DIAGNOSIS — M25651 Stiffness of right hip, not elsewhere classified: Secondary | ICD-10-CM | POA: Diagnosis not present

## 2023-12-13 DIAGNOSIS — R1031 Right lower quadrant pain: Secondary | ICD-10-CM | POA: Diagnosis not present

## 2023-12-13 DIAGNOSIS — E66813 Obesity, class 3: Secondary | ICD-10-CM | POA: Insufficient documentation

## 2023-12-13 DIAGNOSIS — M25652 Stiffness of left hip, not elsewhere classified: Secondary | ICD-10-CM | POA: Diagnosis not present

## 2023-12-13 DIAGNOSIS — G8929 Other chronic pain: Secondary | ICD-10-CM | POA: Insufficient documentation

## 2023-12-13 DIAGNOSIS — Z888 Allergy status to other drugs, medicaments and biological substances status: Secondary | ICD-10-CM | POA: Insufficient documentation

## 2023-12-13 DIAGNOSIS — M160 Bilateral primary osteoarthritis of hip: Secondary | ICD-10-CM | POA: Diagnosis not present

## 2023-12-13 MED ORDER — LIDOCAINE HCL 2 % IJ SOLN
20.0000 mL | Freq: Once | INTRAMUSCULAR | Status: AC
  Administered 2023-12-13: 400 mg
  Filled 2023-12-13: qty 20

## 2023-12-13 MED ORDER — METHYLPREDNISOLONE ACETATE 80 MG/ML IJ SUSP
80.0000 mg | Freq: Once | INTRAMUSCULAR | Status: AC
  Administered 2023-12-13: 80 mg via INTRA_ARTICULAR
  Filled 2023-12-13: qty 1

## 2023-12-13 MED ORDER — PENTAFLUOROPROP-TETRAFLUOROETH EX AERO
INHALATION_SPRAY | Freq: Once | CUTANEOUS | Status: AC
  Administered 2023-12-13: 30 via TOPICAL

## 2023-12-13 MED ORDER — ROPIVACAINE HCL 2 MG/ML IJ SOLN
9.0000 mL | Freq: Once | INTRAMUSCULAR | Status: AC
  Administered 2023-12-13: 9 mL via INTRA_ARTICULAR
  Filled 2023-12-13: qty 20

## 2023-12-13 MED ORDER — IOHEXOL 180 MG/ML  SOLN
10.0000 mL | Freq: Once | INTRAMUSCULAR | Status: AC
  Administered 2023-12-13: 10 mL via INTRA_ARTICULAR
  Filled 2023-12-13: qty 20

## 2023-12-13 MED ORDER — MIDAZOLAM HCL 2 MG/2ML IJ SOLN: 0.5000 mg | Freq: Once | INTRAMUSCULAR | Status: DC

## 2023-12-13 NOTE — Progress Notes (Signed)
 PROVIDER NOTE: Interpretation of information contained herein should be left to medically-trained personnel. Specific patient instructions are provided elsewhere under Patient Instructions section of medical record. This document was created in part using STT-dictation technology, any transcriptional errors that may result from this process are unintentional.  Patient: Bailey Brooks Type: Established DOB: 07-26-1951 MRN: [id number] PCP: Maribeth Camellia MATSU, MD  Service: Procedure DOS: 12/13/2023 Setting: Ambulatory Location: Ambulatory outpatient facility Delivery: Face-to-face Provider: Eric DELENA Como, MD Specialty: Interventional Pain Management Specialty designation: 09 Location: Outpatient facility Ref. Prov.: Maribeth Camellia MATSU, MD       Interventional Therapy   Type: Hip & bursae injection #1  Laterality: Right (-RT)  Bursae: Trochanteric  Laterality: Right (-RT)  Approach: Percutaneous posterolateral approach. Level: Lower pelvic and hip joint level.  Imaging: Fluoroscopy-guided Non-spinal ([id number]) Anesthesia: Local anesthesia (1-2% Lidocaine ) Anxiolysis: None                 Sedation: No Sedation                       DOS: 12/13/2023  Performed by: Eric DELENA Como, MD  Purpose: Diagnostic/Therapeutic Indications: Hip pain severe enough to impact quality of life or function. Rationale (medical necessity): procedure needed and proper for the diagnosis and/or treatment of Ms. Canning's medical symptoms and needs. 1. Chronic hip pain (3ry area of Pain) (Right)   2. Chronic groin pain  (2ry area of Pain) (Right)   3. Impaired range of motion of hip (Right)   4. Osteoarthritis of hips (Bilateral)   5. Decreased range of motion of hips (Bilateral)   6. Morbid obesity with body mass index (BMI) of 40.0 to 44.9 in adult (HCC)   7. Class 3 severe obesity due to excess calories with serious comorbidity and body mass index (BMI) of 40.0 to 44.9 in adult (HCC)   8.  History of Allergy to iodine    NAS-11 Pain score:   Pre-procedure: 3 /10   Post-procedure: 0-No pain/10      Target: Intra-articular aspect of the hip joint & peri-articular bursae Region: Hip joint proper. Femoral region Procedure Type: Percutaneous injection   Position / Prep / Materials:  Position: Lateral Decubitus with affected side up  Prep solution: ChloraPrep (2% chlorhexidine  gluconate and 70% isopropyl alcohol) Prep Area:  Entire Posterolateral hip area. Materials:  Tray: Block tray Needle(s):  Type: Spinal  Gauge (G): 22  Length: 7-in  Qty: 1  H&P (Pre-op Assessment):  Ms. Fritts is a 73 y.o. (year old), female patient, seen today for interventional treatment. She  has a past surgical history that includes Tonsillectomy (1954); Limbal stem cell transplant (2012); Liver biopsy; Solmon hole w/ placement ommaya reservoir; Abdominal hysterectomy (1985); Vulvectomy; Shoulder arthroscopy (Right, 06/22/2015); Insertion central venous access device w/ subcutaneous port (2011); Vulvectomy partial (N/A, 09/29/2015); Colonoscopy (04/2013); Esophagogastroduodenoscopy (04/2013); Burr hole w/ placement ommaya reservoir (2012); Rotator cuff repair (Right, 2016); Injection knee (07/02/2018); Partial mastectomy with needle localization and axillary sentinel lymph node bx (Right, 10/16/2019); Breast biopsy (Right, 2002); Breast biopsy (Right, 2020); Breast lumpectomy (Right, 10/16/2019); and Reverse shoulder arthroplasty (Left, 11/14/2022). Ms. Capers has a current medication list which includes the following prescription(s): acetaminophen , anastrozole , aspirin  ec, atorvastatin , cholecalciferol , diazepam , ezetimibe , hydrochlorothiazide , ketoconazole , lansoprazole , levothyroxine , lisinopril , mometasone , multivitamin, naproxen , specialty vitamins products, and tacrolimus . Her primarily concern today is the Hip Pain (right)  Initial Vital Signs:  Pulse/HCG Rate: 88ECG Heart Rate: 76 Temp: (!) 97.2 F  (36.2 C) Resp:  18 BP: (!) 157/63 SpO2: 98 %  BMI: Estimated body mass index is 42.51 kg/m as calculated from the following:   Height as of this encounter: 5' 3 (1.6 m).   Weight as of this encounter: 240 lb (108.9 kg).  Risk Assessment: Allergies: Reviewed. She is allergic to morphine and codeine, sulfa antibiotics, adhesive [tape], erythromycin, and oxycodone .  Allergy Precautions: None required Coagulopathies: Reviewed. None identified.  Blood-thinner therapy: None at this time Active Infection(s): Reviewed. None identified. Ms. Scharf is afebrile  Site Confirmation: Ms. Neary was asked to confirm the procedure and laterality before marking the site Procedure checklist: Completed Consent: Before the procedure and under the influence of no sedative(s), amnesic(s), or anxiolytics, the patient was informed of the treatment options, risks and possible complications. To fulfill our ethical and legal obligations, as recommended by the American Medical Association's Code of Ethics, I have informed the patient of my clinical impression; the nature and purpose of the treatment or procedure; the risks, benefits, and possible complications of the intervention; the alternatives, including doing nothing; the risk(s) and benefit(s) of the alternative treatment(s) or procedure(s); and the risk(s) and benefit(s) of doing nothing. The patient was provided information about the general risks and possible complications associated with the procedure. These may include, but are not limited to: failure to achieve desired goals, infection, bleeding, organ or nerve damage, allergic reactions, paralysis, and death. In addition, the patient was informed of those risks and complications associated to the procedure, such as failure to decrease pain; infection; bleeding; organ or nerve damage with subsequent damage to sensory, motor, and/or autonomic systems, resulting in permanent pain, numbness, and/or weakness of  one or several areas of the body; allergic reactions; (i.e.: anaphylactic reaction); and/or death. Furthermore, the patient was informed of those risks and complications associated with the medications. These include, but are not limited to: allergic reactions (i.e.: anaphylactic or anaphylactoid reaction(s)); adrenal axis suppression; blood sugar elevation that in diabetics may result in ketoacidosis or comma; water retention that in patients with history of congestive heart failure may result in shortness of breath, pulmonary edema, and decompensation with resultant heart failure; weight gain; swelling or edema; medication-induced neural toxicity; particulate matter embolism and blood vessel occlusion with resultant organ, and/or nervous system infarction; and/or aseptic necrosis of one or more joints. Finally, the patient was informed that Medicine is not an exact science; therefore, there is also the possibility of unforeseen or unpredictable risks and/or possible complications that may result in a catastrophic outcome. The patient indicated having understood very clearly. We have given the patient no guarantees and we have made no promises. Enough time was given to the patient to ask questions, all of which were answered to the patient's satisfaction. Ms. Donnell has indicated that she wanted to continue with the procedure. Attestation: I, the ordering provider, attest that I have discussed with the patient the benefits, risks, side-effects, alternatives, likelihood of achieving goals, and potential problems during recovery for the procedure that I have provided informed consent. Date  Time: 12/13/2023  9:38 AM  Pre-Procedure Preparation:  Monitoring: As per clinic protocol. Respiration, ETCO2, SpO2, BP, heart rate and rhythm monitor placed and checked for adequate function Safety Precautions: Patient was assessed for positional comfort and pressure points before starting the procedure. Time-out: I  initiated and conducted the Time-out before starting the procedure, as per protocol. The patient was asked to participate by confirming the accuracy of the Time Out information. Verification of the correct person, site, and  procedure were performed and confirmed by me, the nursing staff, and the patient. Time-out conducted as per Joint Commission's Universal Protocol (UP.01.01.01). Time: 1056 Start Time: 1057 hrs.  Narrative                Rationale (medical necessity): procedure needed and proper for the diagnosis and/or treatment of the patient's medical symptoms and needs. Procedural Technique Safety Precautions: Aspiration looking for blood return was conducted prior to all injections. At no point did we inject any substances, as a needle was being advanced. No attempts were made at seeking any paresthesias. Safe injection practices and needle disposal techniques used. Medications properly checked for expiration dates. SDV (single dose vial) medications used. Description of the Procedure: Protocol guidelines were followed. The patient was assisted into a comfortable position. The target area was identified and the area prepped in the usual manner. Skin & deeper tissues infiltrated with local anesthetic. Appropriate amount of time allowed to pass for local anesthetics to take effect. The procedure needles were then advanced to the target area. Proper needle placement secured. Negative aspiration confirmed. Solution injected in intermittent fashion, asking for systemic symptoms every 0.5cc of injectate. The needles were then removed and the area cleansed, making sure to leave some of the prepping solution back to take advantage of its long term bactericidal properties.  Technical description of procedure:  Skin & deeper tissues infiltrated with local anesthetic. Appropriate amount of time allowed to pass for local anesthetics to take effect. The procedure needles were then advanced to the target  area. Proper needle placement secured. Negative aspiration confirmed. Solution injected in intermittent fashion, asking for systemic symptoms every 0.5cc of injectate. The needles were then removed and the area cleansed, making sure to leave some of the prepping solution back to take advantage of its long term bactericidal properties.           Vitals:   12/13/23 1050 12/13/23 1055 12/13/23 1100 12/13/23 1105  BP: (!) 145/97 (!) 161/69 (!) 146/53 (!) 142/59  Pulse:      Resp: 16 17 17 19   Temp:      TempSrc:      SpO2: 100% 99% 99% 99%  Weight:      Height:         Start Time: 1057 hrs. End Time: 1103 hrs.  Imaging Guidance (Non-Spinal):          Type of Imaging Technique: Fluoroscopy Guidance (Non-Spinal) Indication(s): Fluoroscopy guidance for needle placement to enhance accuracy in procedures requiring precise needle localization for targeted delivery of medication in or near specific anatomical locations not easily accessible without such real-time imaging assistance. Exposure Time: Please see nurses notes. Contrast: Before injecting any contrast, we confirmed that the patient did not have an allergy to iodine, shellfish, or radiological contrast. Once satisfactory needle placement was completed at the desired level, radiological contrast was injected. Contrast injected under live fluoroscopy. No contrast complications. See chart for type and volume of contrast used. Fluoroscopic Guidance: I was personally present during the use of fluoroscopy. Tunnel Vision Technique used to obtain the best possible view of the target area. Parallax error corrected before commencing the procedure. Direction-depth-direction technique used to introduce the needle under continuous pulsed fluoroscopy. Once target was reached, antero-posterior, oblique, and lateral fluoroscopic projection used confirm needle placement in all planes. Images permanently stored in EMR. Interpretation: I personally  interpreted the imaging intraoperatively. Adequate needle placement confirmed in multiple planes. Appropriate spread of contrast into desired area was  observed. No evidence of afferent or efferent intravascular uptake. Permanent images saved into the patient's record.  Post-operative Assessment:  Post-procedure Vital Signs:  Pulse/HCG Rate: 8870 Temp: (!) 97.2 F (36.2 C) Resp: 19 BP: (!) 142/59 SpO2: 99 %  EBL: None  Complications: No immediate post-treatment complications observed by team, or reported by patient.  Note: The patient tolerated the entire procedure well. A repeat set of vitals were taken after the procedure and the patient was kept under observation following institutional policy, for this type of procedure. Post-procedural neurological assessment was performed, showing return to baseline, prior to discharge. The patient was provided with post-procedure discharge instructions, including a section on how to identify potential problems. Should any problems arise concerning this procedure, the patient was given instructions to immediately contact us , at any time, without hesitation. In any case, we plan to contact the patient by telephone for a follow-up status report regarding this interventional procedure.  Comments:  No additional relevant information.  Plan of Care (POC)  Orders:  Orders Placed This Encounter  Procedures   HIP INJECTION    Scheduling Instructions:     Side: Right-sided     Sedation: Patient's choice.     Timeframe: Today   DG PAIN CLIN[facility] C-ARM 1-60 MIN NO REPORT    Intraoperative interpretation by procedural physician at [facility].    Standing Status:   Standing    Number of Occurrences:   1    Reason for exam::   Assistance in needle guidance and placement for procedures requiring needle placement in or near specific anatomical locations not easily accessible without such assistance.   Informed Consent Details: Physician/Practitioner  Attestation; Transcribe to consent form and obtain patient signature    Nursing Order: Transcribe to consent form and obtain patient signature. Note: Always confirm laterality of pain with Ms. Herminio, before procedure.    Physician/Practitioner attestation of informed consent for procedure/surgical case:   I, the physician/practitioner, attest that I have discussed with the patient the benefits, risks, side effects, alternatives, likelihood of achieving goals and potential problems during recovery for the procedure that I have provided informed consent.    Procedure:   Hip injection    Physician/Practitioner performing the procedure:   Loveta Dellis A. Tanya, MD    Indication/Reason:   Hip Joint Pain (Arthralgia)   Provide equipment / supplies at bedside    Procedure tray: Block Tray (Disposable  single use) Skin infiltration needle: Regular 1.5-in, 25-G, (x1) Block Needle type: Spinal Amount/quantity: 1 Size: Long (7-inch) Gauge: 22G    Standing Status:   Standing    Number of Occurrences:   1    Specify:   Block Tray   Miscellanous precautions    NOTE: Although It is true that patients can have allergies to shellfish and that shellfish contain iodine, most shellfish  allergies are due to two protein allergens present in the shellfish: tropomyosins and parvalbumin. Not all patients with shellfish allergies are allergic to iodine. However, as a precaution, avoid using iodine containing products.    Standing Status:   Standing    Number of Occurrences:   1   Chronic Opioid Analgesic:   None MME/day: 0 mg/day   Medications ordered for procedure: Meds ordered this encounter  Medications   iohexol  (OMNIPAQUE ) 180 MG/ML injection 10 mL    Must be Myelogram-compatible. If not available, you may substitute with a water-soluble, non-ionic, hypoallergenic, myelogram-compatible radiological contrast medium.   lidocaine  (XYLOCAINE ) 2 % (with pres) injection  400 mg    pentafluoroprop-tetrafluoroeth (GEBAUERS) aerosol   DISCONTD: midazolam  (VERSED ) injection 0.5-2 mg    Make sure Flumazenil is available in the pyxis when using this medication. If oversedation occurs, administer 0.2 mg IV over 15 sec. If after 45 sec no response, administer 0.2 mg again over 1 min; may repeat at 1 min intervals; not to exceed 4 doses (1 mg)   methylPREDNISolone  acetate (DEPO-MEDROL ) injection 80 mg   ropivacaine  (PF) 2 mg/mL (0.2%) (NAROPIN ) injection 9 mL   Medications administered: We administered iohexol , lidocaine , pentafluoroprop-tetrafluoroeth, methylPREDNISolone  acetate, and ropivacaine  (PF) 2 mg/mL (0.2%).  See the medical record for exact dosing, route, and time of administration.  Follow-up plan:   Return in about 2 weeks (around 12/27/2023) for (Face2F), (PPE).       Interventional Therapies  Risk Factors  Considerations  Medical Comorbidities:  History of breast cancer and lymphoma     Planned  Pending:  Diagnosis/therapeutic midline L3-4 LESI #1 Diagnostic Lumbar spine MRI    Under consideration:   Diagnostic/therapeutic midline L3-4 LESI #1  Diagnostic/therapeutic bilateral IA hip joint injection #1    Completed:   None at this time   Therapeutic  Palliative (PRN) options:   None established   Completed by other providers:   (07/24/2022) left shoulder injection by Poggi, Norleen Purchase, MD         Recent Visits Date Type Provider Dept  12/04/23 Office Visit Tanya Glisson, MD [facility]  11/15/23 Procedure visit Tanya Glisson, MD [facility]  10/24/23 Office Visit Tanya Glisson, MD [facility]  10/01/23 Office Visit Tanya Glisson, MD [facility]  Showing recent visits within past 90 days and meeting all other requirements Today's Visits Date Type Provider Dept  12/13/23 Procedure visit Tanya Glisson, MD [facility]  Showing today's visits and meeting all other  requirements Future Appointments Date Type Provider Dept  12/27/23 Appointment Tanya Glisson, MD [facility]  Showing future appointments within next 90 days and meeting all other requirements  Disposition: Discharge home  Discharge (Date  Time): 12/13/2023; 1108 hrs.   Primary Care Physician: Maribeth Camellia MATSU, MD Location: [facility] Note by: Glisson DELENA Tanya, MD (TTS technology used. I apologize for any typographical errors that were not detected and corrected.) Date: 12/13/2023; Time: 12:10 PM  Disclaimer:  Medicine is not an visual merchandiser. The only guarantee in medicine is that nothing is guaranteed. It is important to note that the decision to proceed with this intervention was based on the information collected from the patient. The Data and conclusions were drawn from the patient's questionnaire, the interview, and the physical examination. Because the information was provided in large part by the patient, it cannot be guaranteed that it has not been purposely or unconsciously manipulated. Every effort has been made to obtain as much relevant data as possible for this evaluation. It is important to note that the conclusions that lead to this procedure are derived in large part from the available data. Always take into account that the treatment will also be dependent on availability of resources and existing treatment guidelines, considered by other Pain Management Practitioners as being common knowledge and practice, at the time of the intervention. For Medico-Legal purposes, it is also important to point out that variation in procedural techniques and pharmacological choices are the acceptable norm. The indications, contraindications, technique, and results of the above procedure should only be interpreted and judged by a Board-Certified  Interventional Pain Specialist with extensive familiarity and expertise in the same exact procedure and  technique.

## 2023-12-13 NOTE — Patient Instructions (Signed)
 ______________________________________________________________________    Post-Procedure Discharge Instructions  Instructions: Apply ice:  Purpose: This will minimize any swelling and discomfort after procedure.  When: Day of procedure, as soon as you get home. How: Fill a plastic sandwich bag with crushed ice. Cover it with a small towel and apply to injection site. How long: (15 min on, 15 min off) Apply for 15 minutes then remove x 15 minutes.  Repeat sequence on day of procedure, until you go to bed. Apply heat:  Purpose: To treat any soreness and discomfort from the procedure. When: Starting the next day after the procedure. How: Apply heat to procedure site starting the day following the procedure. How long: May continue to repeat daily, until discomfort goes away. Food intake: Start with clear liquids (like water) and advance to regular food, as tolerated.  Physical activities: Keep activities to a minimum for the first 8 hours after the procedure. After that, then as tolerated. Driving: If you have received any sedation, be responsible and do not drive. You are not allowed to drive for 24 hours after having sedation. Blood thinner: (Applies only to those taking blood thinners) You may restart your blood thinner 6 hours after your procedure. Insulin: (Applies only to Diabetic patients taking insulin) As soon as you can eat, you may resume your normal dosing schedule. Infection prevention: Keep procedure site clean and dry. Shower daily and clean area with soap and water. Post-procedure Pain Diary: Extremely important that this be done correctly and accurately. Recorded information will be used to determine the next step in treatment. For the purpose of accuracy, follow these rules: Evaluate only the area treated. Do not report or include pain from an untreated area. For the purpose of this evaluation, ignore all other areas of pain, except for the treated area. After your procedure,  avoid taking a long nap and attempting to complete the pain diary after you wake up. Instead, set your alarm clock to go off every hour, on the hour, for the initial 8 hours after the procedure. Document the duration of the numbing medicine, and the relief you are getting from it. Do not go to sleep and attempt to complete it later. It will not be accurate. If you received sedation, it is likely that you were given a medication that may cause amnesia. Because of this, completing the diary at a later time may cause the information to be inaccurate. This information is needed to plan your care. Follow-up appointment: Keep your post-procedure follow-up evaluation appointment after the procedure (usually 2 weeks for most procedures, 6 weeks for radiofrequencies). DO NOT FORGET to bring you pain diary with you.   Expect: (What should I expect to see with my procedure?) From numbing medicine (AKA: Local Anesthetics): Numbness or decrease in pain. You may also experience some weakness, which if present, could last for the duration of the local anesthetic. Onset: Full effect within 15 minutes of injected. Duration: It will depend on the type of local anesthetic used. On the average, 1 to 8 hours.  From steroids (Applies only if steroids were used): Decrease in swelling or inflammation. Once inflammation is improved, relief of the pain will follow. Onset of benefits: Depends on the amount of swelling present. The more swelling, the longer it will take for the benefits to be seen. In some cases, up to 10 days. Duration: Steroids will stay in the system x 2 weeks. Duration of benefits will depend on multiple posibilities including persistent irritating factors. Side-effects: If  present, they may typically last 2 weeks (the duration of the steroids). Frequent: Cramps (if they occur, drink Gatorade and take over-the-counter Magnesium  450-500 mg once to twice a day); water retention with temporary weight gain;  increases in blood sugar; decreased immune system response; increased appetite. Occasional: Facial flushing (red, warm cheeks); mood swings; menstrual changes. Uncommon: Long-term decrease or suppression of natural hormones; bone thinning. (These are more common with higher doses or more frequent use. This is why we prefer that our patients avoid having any injection therapies in other practices.)  Very Rare: Severe mood changes; psychosis; aseptic necrosis. From procedure: Some discomfort is to be expected once the numbing medicine wears off. This should be minimal if ice and heat are applied as instructed.  Call if: (When should I call?) You experience numbness and weakness that gets worse with time, as opposed to wearing off. New onset bowel or bladder incontinence. (Applies only to procedures done in the spine)  Emergency Numbers: Durning business hours (Monday - Thursday, 8:00 AM - 4:00 PM) (Friday, 9:00 AM - 12:00 Noon): [phone] After hours: [phone] NOTE: If you are having a problem and are unable connect with, or to talk to a provider, then go to your nearest urgent care or emergency department. If the problem is serious and urgent, please call 911. ______________________________________________________________________      ______________________________________________________________________    Patient information on: Body mass index (BMI) and Weight Management  Dear Bailey Brooks you are receiving this information because your weight may be adversely affecting your health.   Your current Estimated body mass index is 42.51 kg/m as calculated from the following:   Height as of 12/07/23: 5' 3 (1.6 m).   Weight as of 12/07/23: 240 lb (108.9 kg).  We recommend you talk to your primary care physician about providing or referring you to a supervised weight management program.  Here is some information about weight and the body mass index (BMI) classification:  BMI is a  measure of obesity that's calculated by dividing a person's weight in kilograms by their height in meters squared. A person can use an online calculator to determine their BMI. Body mass index (BMI) is a common tool for deciding whether a person has an appropriate body weight.  It measures a person's weight in relation to their height.  According to the [facility] of health (NIH): A BMI of less than 18.5 means that a person is underweight. A BMI of between 18.5 and 24.9 is ideal. A BMI of between 25 and 29.9 is overweight. A BMI over 30 indicates obesity.  Body Mass Index (BMI) Classification BMI level (kg/m2) Category Associated incidence of chronic pain  <18  Underweight   18.5-24.9 Ideal body weight   25-29.9 Overweight  20%  30-34.9 Obese (Class I)  68%  35-39.9 Severe obesity (Class II)  136%  >40 Extreme obesity (Class III)  254%    Morbidly Obese Classification: You will be considered to be Morbidly Obese if your BMI is above 30 and you have one or more of the following conditions caused or associated to obesity: 1.    Type 2 Diabetes (Leading to cardiovascular diseases (CVD), stroke, peripheral vascular diseases (PVD), retinopathy, nephropathy, and neuropathy) 2.    Cardiovascular Disease (High Blood Pressure; Congestive Heart Failure; High Cholesterol; Coronary Artery Disease; Angina; Arrhythmias, Dysrhythmias, or Heart Attacks) 3.    Breathing problems (Asthma; obesity-hypoventilation syndrome; obstructive sleep apnea; chronic inflammatory airway disease; reactive airway disease; or shortness  of breath) 4.    Chronic kidney disease 5.    Liver disease (nonalcoholic fatty liver disease) 6.    High blood pressure 7.    Acid reflux (gastroesophageal reflux disease; heartburn) 8.    Osteoarthritis (OA) (affecting the hip(s), the knee(s) and/or the lower back) (usually requiring knee and/or hip replacements, as well as back surgeries) 9.    Low back pain (Lumbar Facet  Syndrome; and/or Degenerative Disc Disease) 10.  Hip pain (Osteoarthritis of hip) (For every 1 lbs of added body weight, there is a 2 lbs increase in pressure inside of each hip articulation. 1:2 mechanical relationship) 11.  Knee pain (Osteoarthritis of knee) (For every 1 lbs of added body weight, there is a 4 lbs increase in pressure inside of each knee articulation. 1:4 mechanical relationship) (patients with a BMI>30 kg/m2 were 6.8 times more likely to develop knee OA than normal-weight individuals) 12.  Cancer: Epidemiological studies have shown that obesity is a risk factor for: post-menopausal breast cancer; cancers of the endometrium, colon and kidney cancer; malignant adenomas of the esophagus. Obese subjects have an approximately 1.5-3.5-fold increased risk of developing these cancers compared with normal-weight subjects, and it has been estimated that between 15 and 45% of these cancers can be attributed to overweight. More recent studies suggest that obesity may also increase the risk of other types of cancer, including pancreatic, hepatic and gallbladder cancer. (Ref: Obesity and cancer. Pischon T, Nthlings U, [employer] H. Proc Nutr Soc. 2008 May;67(2):128-45. doi: 10.1017/S0029665108006976.) The International Agency for Research on Cancer (IARC) has identified 13 cancers associated with overweight and obesity: meningioma, multiple myeloma, adenocarcinoma of the esophagus, and cancers of the thyroid , postmenopausal breast cancer, gallbladder, stomach, liver, pancreas, kidney, ovaries, uterus, colon and rectal (colorectal) cancers. 55 percent of all cancers diagnosed in women and 24 percent of those diagnosed in men are associated with overweight and obesity.  Recommendation: If you have any of the above conditions it is urgent that you take a step back and concentrate in losing weight. Dedicate 100% of your efforts on this task. Nothing else will improve your health more than bringing your weight  down and your BMI to less than 30.   Nutritionist and/or supervised weight-management program: We are aware that most chronic pain patients are unable to exercise secondary to their pain. For this reason, you must rely on proper nutrition and diet in order to lose the weight. We recommend you talk to a nutritionist.   Bariatric surgery: A person might be considered a candidate for bariatric surgery if they meet one of the following BMI criteria:  BMI of 40 or higher: This is considered extreme obesity (Class III). BMI of 35-39.9: This is considered obesity, and the person might also have a serious weight-related health condition, such as high blood pressure, type 2 diabetes, or severe sleep apnea  BMI of 30-34.9: This might be considered if the person has serious weight-related health problems and hasn't had substantial weight loss or improvement in co-morbidities through other methods   On your own: A realistic goal is to lose 10% of your body weight over a period of 12 months.  If over a period of six (6) months you have unsuccessfully tried to lose weight, then it is time for you to seek professional help and to enter a medically supervised weight management program, and/or undergo bariatric surgery.   Pain management considerations and possible limitations:  1.    Pharmacological Problems: Be advised that the use  of opioid analgesics (oxycodone ; hydrocodone ; morphine; methadone; codeine; and all of their derivatives) have been associated with decreased metabolism and weight gain.  For this reason, should we see that you are unable to lose weight while taking these medications, it may become necessary for us  to taper down and indefinitely discontinue them.  2.    Technical Problems: The incidence of successful interventional therapies decreases as the patient's BMI increases. It is much more difficult to accomplish a safe and effective interventional therapy on a patient with a BMI above 35. 3.     Radiation Exposure Problems: The x-rays machine, used to accomplish injection therapies, will automatically increase their x-ray output in order to capture an appropriate bone image. This means that radiation exposure increases exponentially with the patient's BMI. (The higher the BMI, the higher the radiation exposure.) Although the level of radiation used at a given time is still safe to the patient, it is not for the physician and/or assisting staff. Unfortunately, radiation exposure is accumulative. Because physicians and the staff have to do procedures and be exposed on a daily basis, this can result in health problems such as cancer and radiation burns. Radiation exposure to the staff is monitored by the radiation batches that they wear. The exposure levels are reported back to the staff on a quarterly basis. Depending on levels of exposure, physicians and staff may be obligated by law to decrease this exposure. This means that they have the right and obligation to refuse providing therapies where they may be overexposed to radiation. For this reason, physicians may decline to offer therapies such as radiofrequency ablation or implants to patients with a BMI above 40. 4.    Current Trends: Be advised that the current trend is to no longer offer certain therapies to patients with a BMI equal to, or above 35, due to increase perioperative risks, increased technical procedural difficulties, and excessive radiation exposure to healthcare personnel.  Last updated: 08/27/2023 ______________________________________________________________________

## 2023-12-14 ENCOUNTER — Telehealth: Payer: Self-pay

## 2023-12-14 NOTE — Telephone Encounter (Signed)
Post procedure follow up.  Patient states she is doing great.

## 2023-12-26 ENCOUNTER — Encounter: Payer: Self-pay | Admitting: Internal Medicine

## 2023-12-27 ENCOUNTER — Ambulatory Visit: Payer: Self-pay | Admitting: Family Medicine

## 2023-12-27 ENCOUNTER — Ambulatory Visit: Payer: Medicare Other | Attending: Pain Medicine | Admitting: Pain Medicine

## 2023-12-27 ENCOUNTER — Encounter: Payer: Self-pay | Admitting: Pain Medicine

## 2023-12-27 VITALS — BP 134/79 | HR 81 | Temp 97.2°F | Resp 16 | Ht 63.0 in | Wt 240.0 lb

## 2023-12-27 DIAGNOSIS — M25551 Pain in right hip: Secondary | ICD-10-CM | POA: Insufficient documentation

## 2023-12-27 DIAGNOSIS — G8929 Other chronic pain: Secondary | ICD-10-CM | POA: Diagnosis not present

## 2023-12-27 DIAGNOSIS — R1031 Right lower quadrant pain: Secondary | ICD-10-CM | POA: Insufficient documentation

## 2023-12-27 DIAGNOSIS — M25651 Stiffness of right hip, not elsewhere classified: Secondary | ICD-10-CM | POA: Insufficient documentation

## 2023-12-27 DIAGNOSIS — M545 Low back pain, unspecified: Secondary | ICD-10-CM | POA: Insufficient documentation

## 2023-12-27 DIAGNOSIS — Z09 Encounter for follow-up examination after completed treatment for conditions other than malignant neoplasm: Secondary | ICD-10-CM | POA: Insufficient documentation

## 2023-12-27 DIAGNOSIS — M25652 Stiffness of left hip, not elsewhere classified: Secondary | ICD-10-CM | POA: Insufficient documentation

## 2023-12-27 NOTE — Progress Notes (Signed)
Safety precautions to be maintained throughout the outpatient stay will include: orient to surroundings, keep bed in low position, maintain call bell within reach at all times, provide assistance with transfer out of bed and ambulation.  

## 2023-12-27 NOTE — Progress Notes (Addendum)
PROVIDER NOTE: Information contained herein reflects review and annotations entered in association with encounter. Interpretation of such information and data should be left to medically-trained personnel. Information provided to patient can be located elsewhere in the medical record under "Patient Instructions". Document created using STT-dictation technology, any transcriptional errors that may result from process are unintentional.    Patient: Bailey Brooks Comment  Service Category: E/M  Provider: Oswaldo Done, MD  DOB: June 13, 1951  DOS: 12/27/2023  Referring Provider: Glori Luis, MD  MRN: [id number]  Specialty: Interventional Pain Management  PCP: Glori Luis, MD  Type: Established Patient  Setting: Ambulatory outpatient    Location: Office  Delivery: Face-to-face     HPI  Ms. LACOLE Brooks, a 73 y.o. year old female, is here today because of her Chronic hip pain, right [M25.551, G89.29]. [name] primary complain today is Hip Pain (right)  Pertinent problems: 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; Lumbar spondylosis; Rotator cuff tear; Status post reverse arthroplasty of shoulder, left; Rotator cuff arthropathy, left; Chronic pain syndrome; Chronic groin pain  (2ry area of Pain) (Right); History of breast cancer; Chronic groin pain (Left); Spinal stenosis, lumbar region, with neurogenic claudication; Chronic shoulder pain (5th area of Pain) (Bilateral); Lumbar radicular pain; Abnormal MRI, lumbar spine (11/08/2023); DDD (degenerative disc disease), lumbosacral; Lumbar facet joint pain;  Lumbar facet joint syndrome (Bilateral) (R>L); Lumbar facet arthropathy (Multilevel) (Bilateral); Spondylosis without myelopathy or radiculopathy, lumbosacral region; Impaired range of motion of hip (Right); Decreased range of motion of hips (Bilateral); and Osteoarthritis of hips (Bilateral) on their pertinent problem list. Pain Assessment: Severity of Chronic pain is reported as a 0-No pain/10. Location: Hip (back still hurts) Right/hip around to groin. Onset: More than a month ago. Quality: Aching, Discomfort. Timing: Constant. Modifying factor(s): procedure. Vitals:  height is 5\' 3"  (1.6 m) and weight is 240 lb (108.9 kg). Her temperature is 97.2 F (36.2 C) (abnormal). Her blood pressure is 134/79 and her pulse is 81. Her respiration is 16 and oxygen saturation is 98%.  BMI: Estimated body mass index is 42.51 kg/m as calculated from the following:   Height as of this encounter: 5\' 3"  (1.6 m).   Weight as of this encounter: 240 lb (108.9 kg). Last encounter: 12/04/2023. Last procedure: 12/13/2023.  Reason for encounter: post-procedure evaluation and assessment.  Discussed the use of AI scribe software for clinical note transcription with the patient, who gave verbal consent to proceed.  History of Present Illness   The patient, with a history of hip pain, reports significant improvement following a recent injection. She was informed that the injection confirmed the hip as the source of her pain and that the steroid used helped to reduce swelling, thereby alleviating the pain.  In addition to the hip pain, the patient has been experiencing a sensation of weakness, particularly in her legs and arms. She describes it as a feeling of imbalance, to the point of feeling like she might fall. She also notes that her hands feel weak. This sensation is not associated with dizziness or fainting, which she has experienced in the past. She reports that this feeling of weakness  is different from those previous  experiences.  The patient also mentions an episode of feeling 'weird' after eating breakfast, which included eggs, bacon, and a pancake with syrup. She checked her blood pressure and glucose levels at home following this episode, both of which were within normal limits.  The patient has a history of lymphoma, and she expresses concern that her current symptoms are similar to those she experienced at the onset of her lymphoma twelve years ago. She reports feeling excessively hot after minimal exertion, such as walking from the door to the upstairs. She has scheduled appointments with her cardiologist and oncologist to further evaluate these symptoms.  Regarding her back pain, the patient reports no current issues but was advised to seek medical attention if the pain becomes problematic.      Post-procedure evaluation   Type: Hip & bursae injection #1  Laterality: Right (-RT)  Bursae: Trochanteric  Laterality: Right (-RT)  Approach: Percutaneous posterolateral approach. Level: Lower pelvic and hip joint level.  Imaging: Fluoroscopy-guided Non-spinal ([id number]) Anesthesia: Local anesthesia (1-2% Lidocaine) Anxiolysis: None                 Sedation: No Sedation                       DOS: 12/13/2023  Performed by: Oswaldo Done, MD  Purpose: Diagnostic/Therapeutic Indications: Hip pain severe enough to impact quality of life or function. Rationale (medical necessity): procedure needed and proper for the diagnosis and/or treatment of Ms. Lampi's medical symptoms and needs. 1. Chronic hip pain (3ry area of Pain) (Right)   2. Chronic groin pain  (2ry area of Pain) (Right)   3. Impaired range of motion of hip (Right)   4. Osteoarthritis of hips (Bilateral)   5. Decreased range of motion of hips (Bilateral)   6. Morbid obesity with body mass index (BMI) of 40.0 to 44.9 in adult (HCC)   7. Class 3 severe obesity due to excess calories with serious comorbidity and body mass index (BMI)  of 40.0 to 44.9 in adult (HCC)   8. History of Allergy to iodine    NAS-11 Pain score:   Pre-procedure: 3 /10   Post-procedure: 0-No pain/10       Effectiveness:  Initial hour after procedure: 100 %. Subsequent 4-6 hours post-procedure: 90 %. Analgesia past initial 6 hours: 100 % (current). Ongoing improvement:  Analgesic: The patient indicates having attained an ongoing 100% relief of her hip pain. Function: Ms. Grannis reports improvement in function ROM: Ms. Sultani reports improvement in ROM  Pharmacotherapy Assessment  Analgesic: No chronic opioid analgesics therapy prescribed by our practice. None MME/day: 0 mg/day   Monitoring: [place] PMP: PDMP reviewed during this encounter.       Pharmacotherapy: No side-effects or adverse reactions reported. Compliance: No problems identified. Effectiveness: Clinically acceptable.  Newman Pies, RN  12/27/2023  1:20 PM  Signed Safety precautions to be maintained throughout the outpatient stay will include: orient to surroundings, keep bed in low position, maintain call bell within reach at all times, provide assistance with transfer out of bed and ambulation.     No results found for: "CBDTHCR" No results found for: "D8THCCBX" No results found for: "D9THCCBX"  UDS:  No results found for: "SUMMARY"    ROS  Constitutional: Denies any fever or chills Gastrointestinal: No reported hemesis, hematochezia, vomiting, or acute GI distress Musculoskeletal: Denies any acute onset joint swelling, redness, loss of ROM, or weakness  Neurological: No reported episodes of acute onset apraxia, aphasia, dysarthria, agnosia, amnesia, paralysis, loss of coordination, or loss of consciousness  Medication Review  Specialty Vitamins Products, acetaminophen, anastrozole, aspirin EC, atorvastatin, cholecalciferol, diazepam, ezetimibe, hydrochlorothiazide, ketoconazole, lansoprazole, levothyroxine, lisinopril, mometasone, multivitamin, naproxen, and  tacrolimus  History Review  Allergy: Ms. Deguire is allergic to morphine and codeine, sulfa antibiotics, adhesive [tape], erythromycin, and oxycodone. Drug: Ms. Simpkins  reports no history of drug use. Alcohol:  reports no history of alcohol use. Tobacco:  reports that she has never smoked. She has never used smokeless tobacco. Social: Ms. Stelmack  reports that she has never smoked. She has never used smokeless tobacco. She reports that she does not drink alcohol and does not use drugs. 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 Surgical: Ms. Malena  has a past surgical history that includes Tonsillectomy (1954); Limbal stem cell transplant (2012); Liver biopsy; Ines Bloomer hole w/ placement ommaya reservoir; Abdominal hysterectomy (1985); Vulvectomy; Shoulder arthroscopy (Right, 06/22/2015); Insertion central venous access device w/ subcutaneous port (2011); Vulvectomy partial (N/A, 09/29/2015); Colonoscopy (04/2013); Esophagogastroduodenoscopy (04/2013); Burr hole w/ placement ommaya reservoir (2012); Rotator cuff repair (Right, 2016); Injection knee (07/02/2018); Partial mastectomy with needle localization and  axillary sentinel lymph node bx (Right, 10/16/2019); Breast biopsy (Right, 2002); Breast biopsy (Right, 2020); Breast lumpectomy (Right, 10/16/2019); and Reverse shoulder arthroplasty (Left, 11/14/2022). Family: family history includes Breast cancer (age of onset: 29) in her sister; Cancer in her maternal aunt; Diabetes in her mother; Hearing loss in her maternal aunt and maternal grandfather; Heart attack in her mother; Heart disease (age of onset: 51) in her brother; Hypertension in her mother; Kidney cancer (age of onset: 60) in her niece; Kidney disease in her sister; Melanoma (age of onset: 58) in her father; Other in her maternal grandmother; Prostate cancer in her brother.  Laboratory Chemistry Profile   Renal Lab Results  Component Value Date   BUN 24 (H) 08/24/2023   CREATININE 0.89 08/24/2023   GFR 64.25 08/24/2023   GFRAA >60 07/05/2020   GFRNONAA 44 (L) 06/05/2023    Hepatic Lab Results  Component Value Date   AST 27 11/06/2022   ALT 22 11/06/2022   ALBUMIN 3.8 11/06/2022   ALKPHOS 94 11/06/2022   LIPASE 93 09/03/2013    Electrolytes Lab Results  Component Value Date   NA 139 08/24/2023   K 4.2 08/24/2023   CL 104 08/24/2023   CALCIUM 9.6 08/24/2023   MG 1.9 06/12/2018    Bone Lab Results  Component Value Date   VD25OH 59.67 11/23/2014    Inflammation (CRP: Acute Phase) (ESR: Chronic Phase) No results found for: "CRP", "ESRSEDRATE", "LATICACIDVEN"       Note: Above Lab results reviewed.  Recent Imaging Review  DG HIPS BILAT W OR W/O PELVIS MIN 5 VIEWS CLINICAL DATA:  Chronic bilateral hip pain (M25.551, M25.552, G89.29)  EXAM: DG HIP (WITH OR WITHOUT PELVIS) 5+V BILAT  COMPARISON:  01/28/2021 pelvic and right hip radiographs  FINDINGS: No pelvic fracture or diastasis. No fracture or dislocation in either hip. Moderate to severe bilateral hip osteoarthritis, worsened in the interval bilaterally. No suspicious focal osseous lesions. Moderate  degenerative changes in the visualized lower lumbar spine. Chronic enthesopathy along the lateral iliac wings and greater trochanters bilaterally.  IMPRESSION: 1. Moderate to severe bilateral hip osteoarthritis, worsened in the interval bilaterally. 2. Moderate degenerative changes in the visualized lower lumbar spine.  Electronically Signed   By: Delbert Phenix M.D.   On: 12/15/2023 15:19 Note: Reviewed        Physical Exam  General appearance: Well nourished, well developed, and well hydrated. In no apparent acute distress Mental status: Alert, oriented x 3 (person, place, & time)       Respiratory: No evidence of acute respiratory distress Eyes: PERLA Vitals: BP 134/79   Pulse 81   Temp (!) 97.2 F (36.2 C)   Resp 16   Ht 5\' 3"  (1.6 m)   Wt 240 lb (108.9 kg)   SpO2 98%   BMI 42.51 kg/m  BMI: Estimated body mass index is 42.51 kg/m as calculated from the following:   Height as of this encounter: 5\' 3"  (1.6 m).   Weight as of this encounter: 240 lb (108.9 kg). Ideal: Ideal body weight: 52.4 kg (115 lb 8.3 oz) Adjusted ideal body weight: 75 kg (165 lb 5 oz)  Assessment   Diagnosis Status  1. Chronic hip pain (3ry area of Pain) (Right)   2. Chronic groin pain  (2ry area of Pain) (Right)   3. Impaired range of motion of hip (Right)   4. Decreased range of motion of hips (Bilateral)   5. Chronic low back pain (1ry area of Pain) (Bilateral) (R>L) w/o sciatica   6. Postop check    Controlled Controlled Controlled   Updated Problems: No problems updated.  Plan of Care  Problem-specific:  Assessment and Plan    Hip Pain   There is significant improvement in hip pain following a steroid injection, indicating reduced swelling. Currently asymptomatic but aware of potential symptom recurrence. Monitor for recurrence and administer another steroid injection if symptoms recur.  Generalized Weakness   Weakness in legs and arms, especially after meals, suggests possible  anemia, cardiac issues, or lymphoma recurrence. Cardiology and oncology evaluations are scheduled. Follow up with the cardiologist on Monday and attend the oncologist appointment on February 19th. Consult the primary care physician for further evaluation.  General Health Maintenance   Transitioning to a new primary care physician due to the departure of the current PCP raises concerns about the new physician's familiarity with medical history. Establish care with the new primary care physician and ensure she reviews medical records.  Follow-up   Follow up with the cardiologist on Monday, attend the oncologist appointment on February 19th, and contact the pain specialist if hip pain recurs.       Ms. KHAYLAH TILLETT has a current medication list which includes the following long-term medication(s): atorvastatin, ezetimibe, hydrochlorothiazide, lansoprazole, levothyroxine, and lisinopril.  Pharmacotherapy (Medications Ordered): No orders of the defined types were placed in this encounter.  Orders:  Orders Placed This Encounter  Procedures   Nursing Instructions:    Please complete this patient's postprocedure evaluation.    Scheduling Instructions:     Please complete this patient's postprocedure evaluation.   Follow-up plan:   Return if symptoms worsen or fail to improve.  Interventional Therapies  Risk Factors  Considerations  Medical Comorbidities:  History of breast cancer and lymphoma     Planned  Pending:  Diagnosis/therapeutic midline L3-4 LESI #1 Diagnostic Lumbar spine MRI    Under consideration:   Diagnostic/therapeutic midline L3-4 LESI #1  Diagnostic/therapeutic bilateral IA hip joint injection #1    Completed:   None at this time   Therapeutic  Palliative (PRN) options:   None established   Completed by other providers:   (07/24/2022) left shoulder injection by Poggi, Thalia Bloodgood, MD         Recent Visits Date Type Provider Dept  12/13/23  Procedure visit Delano Metz, MD [facility]  12/04/23 Office Visit Delano Metz, MD [facility]  11/15/23 Procedure visit Delano Metz, MD [facility]  10/24/23 Office Visit Delano Metz, MD [facility]  10/01/23 Office Visit Delano Metz, MD [facility]  Showing recent visits within past 90 days and meeting all other requirements Today's Visits Date Type Provider Dept  12/27/23 Office Visit Delano Metz, MD [facility]  Showing today's visits and meeting all other requirements Future Appointments No visits were found meeting these conditions. Showing future appointments within next 90 days and meeting all other requirements  I discussed the assessment and treatment plan with the patient. The patient was provided an opportunity to ask questions and all were answered. The patient agreed with the plan and demonstrated an understanding of the instructions.  Patient advised to call back or seek an in-person evaluation if the symptoms or condition worsens.  Duration of encounter: 30 minutes.  Total time on encounter, as per AMA guidelines included both the face-to-face and non-face-to-face time personally spent by the physician and/or other qualified health care professional(s) on the day of the encounter (includes time in activities that require the physician or other qualified health care professional and does not include time in activities normally performed by clinical staff). Physician's time may include the following activities when performed: Preparing to see the patient (e.g., pre-charting review of records, searching for previously ordered imaging, lab work, and nerve conduction tests) Review of prior analgesic pharmacotherapies. Reviewing PMP Interpreting ordered tests (e.g., lab work, imaging, nerve conduction tests) Performing post-procedure evaluations, including interpretation of  diagnostic procedures Obtaining and/or reviewing separately obtained history Performing a medically appropriate examination and/or evaluation Counseling and educating the patient/family/caregiver Ordering medications, tests, or procedures Referring and communicating with other health care professionals (when not separately reported) Documenting clinical information in the electronic or other health record Independently interpreting results (not separately reported) and communicating results to the patient/ family/caregiver Care coordination (not separately reported)  Note by: Oswaldo Done, MD Date: 12/27/2023; Time: 1:21 PM

## 2023-12-27 NOTE — Telephone Encounter (Signed)
   Chief Complaint: generalized weakness Symptoms: mild weakness, feeling "hot" Frequency: x 3 weeks Pertinent Negatives: Patient denies fever, numbness, sob, pain, swelling Disposition: [] ED /[] Urgent Care (no appt availability in office) / [x] Appointment(In office/virtual)/ []  [facility]/ [] Home Care/ [] Refused Recommended Disposition /[] [facility]/ []  Follow-up with PCP Additional Notes: Patient reports she has been experiencing mild generalized weakness "all over" x 3 weeks, along with feeling "hot". Patient reports that she was diagnosed with lymphoma 12 years ago and she is concerned as these are the symptoms she had that led to her diagnosis. Per protocol, patient scheduled for in office appt tomorrow 1/24. Patient advised to call back with worsening symptoms. Patient verbalized understanding.      Copied from CRM [id number]. Topic: Appointments - Appointment Scheduling >> Dec 27, 2023  1:39 PM [place] A wrote: Patient is having weakness and feeling "hot" her body temperature is rising- Reason for Disposition  [1] MILD weakness (i.e., does not interfere with ability to work, go to school, normal activities) AND [2] persists > 1 week  Answer Assessment - Initial Assessment Questions 1. DESCRIPTiON: "Describe how you are feeling."     All of my limbs feel weaker than normal 2. SEVERITY: "How bad is it?"  "Can you stand and walk?"   - MILD (0-3): Feels weak or tired, but does not interfere with work, school or normal activities.   - MODERATE (4-7): Able to stand and walk; weakness interferes with work, school, or normal activities.   - SEVERE (8-10): Unable to stand or walk; unable to do usual activities.     mild 3. ONSET: "When did these symptoms begin?" (e.g., hours, days, weeks, months)     3 weeks 4. CAUSE: "What do you think is causing the weakness or fatigue?" (e.g., not drinking enough fluids, medical problem, trouble sleeping)     I had lymphoma 12  years ago, and this was how my symptoms started 5. NEW MEDICINES:  "Have you started on any new medicines recently?" (e.g., opioid pain medicines, benzodiazepines, muscle relaxants, antidepressants, antihistamines, neuroleptics, beta blockers)     no 6. OTHER SYMPTOMS: "Do you have any other symptoms?" (e.g., chest pain, fever, cough, SOB, vomiting, diarrhea, bleeding, other areas of pain)     Feeling "hot"  Protocols used: Weakness (Generalized) and Fatigue-A-AH

## 2023-12-27 NOTE — Patient Instructions (Signed)

## 2023-12-28 ENCOUNTER — Ambulatory Visit: Payer: Medicare Other | Admitting: Nurse Practitioner

## 2023-12-28 ENCOUNTER — Encounter: Payer: Self-pay | Admitting: Nurse Practitioner

## 2023-12-28 VITALS — BP 118/73 | HR 73 | Temp 97.5°F | Ht 63.0 in | Wt 243.0 lb

## 2023-12-28 DIAGNOSIS — R5383 Other fatigue: Secondary | ICD-10-CM | POA: Diagnosis not present

## 2023-12-28 LAB — CBC WITH DIFFERENTIAL/PLATELET
Basophils Absolute: 0 10*3/uL (ref 0.0–0.1)
Basophils Relative: 0.4 % (ref 0.0–3.0)
Eosinophils Absolute: 0.1 10*3/uL (ref 0.0–0.7)
Eosinophils Relative: 1.3 % (ref 0.0–5.0)
HCT: 40.4 % (ref 36.0–46.0)
Hemoglobin: 13.1 g/dL (ref 12.0–15.0)
Lymphocytes Relative: 31.3 % (ref 12.0–46.0)
Lymphs Abs: 2.1 10*3/uL (ref 0.7–4.0)
MCHC: 32.5 g/dL (ref 30.0–36.0)
MCV: 89.6 fL (ref 78.0–100.0)
Monocytes Absolute: 0.6 10*3/uL (ref 0.1–1.0)
Monocytes Relative: 8.3 % (ref 3.0–12.0)
Neutro Abs: 4 10*3/uL (ref 1.4–7.7)
Neutrophils Relative %: 58.7 % (ref 43.0–77.0)
Platelets: 221 10*3/uL (ref 150.0–400.0)
RBC: 4.51 Mil/uL (ref 3.87–5.11)
RDW: 14.9 % (ref 11.5–15.5)
WBC: 6.8 10*3/uL (ref 4.0–10.5)

## 2023-12-28 LAB — COMPREHENSIVE METABOLIC PANEL
ALT: 14 U/L (ref 0–35)
AST: 17 U/L (ref 0–37)
Albumin: 3.9 g/dL (ref 3.5–5.2)
Alkaline Phosphatase: 81 U/L (ref 39–117)
BUN: 28 mg/dL — ABNORMAL HIGH (ref 6–23)
CO2: 29 meq/L (ref 19–32)
Calcium: 9.5 mg/dL (ref 8.4–10.5)
Chloride: 103 meq/L (ref 96–112)
Creatinine, Ser: 0.96 mg/dL (ref 0.40–1.20)
GFR: 58.53 mL/min — ABNORMAL LOW (ref >60.00)
Glucose, Bld: 101 mg/dL — ABNORMAL HIGH (ref 70–99)
Potassium: 4.5 meq/L (ref 3.5–5.1)
Sodium: 140 meq/L (ref 135–145)
Total Bilirubin: 0.4 mg/dL (ref 0.2–1.2)
Total Protein: 6.6 g/dL (ref 6.0–8.3)

## 2023-12-28 LAB — VITAMIN B12: Vitamin B-12: 360 pg/mL (ref 211–911)

## 2023-12-28 LAB — MAGNESIUM: Magnesium: 1.9 mg/dL (ref 1.5–2.5)

## 2023-12-28 LAB — TSH: TSH: 3.3 u[IU]/mL (ref 0.35–5.50)

## 2023-12-28 LAB — VITAMIN D 25 HYDROXY (VIT D DEFICIENCY, FRACTURES): VITD: 42.98 ng/mL (ref 30.00–100.00)

## 2023-12-28 NOTE — Progress Notes (Signed)
Established Patient Office Visit  Subjective:  Patient ID: Bailey Brooks, female    DOB: 08-05-1951  Age: 73 y.o. MRN: [id number]  CC:  Chief Complaint  Patient presents with   Acute Visit    Generalized Weakness   Discussed the use of a AI scribe software for clinical note transcription with the patient, who gave verbal consent to proceed.  HPI  Bailey Brooks with a history of lymphoma and breast cancer, presents with generalized weakness and imbalance, particularly in the arms and legs. These symptoms have been intermittent but have become more consistent over the past two weeks. The patient also reports episodes of intense heat, not associated with exertion or shortness of breath, which they differentiate from hot flashes. These episodes are often triggered by minor physical activity, such as walking a short distance.  The patient is currently on hormone therapy for breast cancer and levothyroxine for thyroid issues. They have not noticed any swollen lymph nodes, similar to their initial presentation of lymphoma. The patient also reports a history of back and hip pain, for which they received an epidural and a hip injection, respectively. The hip injection alleviated much of the hip and back pain.  The patient has noticed that their symptoms of weakness seem to occur after eating, although they are unsure if this is a consistent pattern. They have not experienced any changes in bowel movements or acid reflux. The patient is due to see a cardiologist for a follow-up appointment. HPI   Past Medical History:  Diagnosis Date   Adult pulmonary Langerhans cell histiocytosis (HCC)    Eosinophilic Granuloma of the Lung)   Allergic rhinitis    Antineoplastic chemotherapy induced anemia    Arthritis    Breast cancer, right (HCC) 09/17/2019   a.) Bx (+) stage 1 IMC (G1, ER/PR +, Her2/neu -); pT1a N0 M0; Tx'd with adjuvant XRT + endocrine therapy   Complication of anesthesia    pt  was awake during intubation for colonoscopy   Diastolic dysfunction    a.) TTE 11/14/2016: EF 60-65%, no rwma, mild LVH, triv MR, mildly dil LA, nl RVSF, G1DD; b. 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   Tubular adenoma of colon     Past Surgical History:  Procedure Laterality Date   ABDOMINAL HYSTERECTOMY  1985   Hysterectomy-partial   BREAST BIOPSY Right 2002   Neg - AT [facility]   BREAST BIOPSY Right 2020   bx done at [facility]?, IDC and DCIS   BREAST LUMPECTOMY Right 10/16/2019   IDC and DCIS, negative LN   BURR HOLE W/ PLACEMENT OMMAYA RESERVOIR     BURR HOLE W/ PLACEMENT OMMAYA RESERVOIR  2012   COLONOSCOPY  04/2013   ESOPHAGOGASTRODUODENOSCOPY  04/2013   INJECTION KNEE  07/02/2018    INSERTION CENTRAL VENOUS ACCESS DEVICE W/ SUBCUTANEOUS PORT  2011   Port a Cath: Right chest Double Lumen, 04-Nov-2010   LIMBAL STEM CELL TRANSPLANT  2012   LIVER BIOPSY     stage 4B large Bcell lymphoma   PARTIAL MASTECTOMY WITH NEEDLE LOCALIZATION AND AXILLARY SENTINEL LYMPH NODE BX Right 10/16/2019   Procedure: PARTIAL MASTECTOMY WITH NEEDLE LOCALIZATION AND AXILLARY SENTINEL LYMPH NODE BX;  Surgeon: Sung Amabile, DO;  Location: [facility] ORS;  Service: General;  Laterality: Right;   REVERSE SHOULDER ARTHROPLASTY Left 11/14/2022   Procedure: REVERSE SHOULDER ARTHROPLASTY WITH BICEPS TENODESIS.;  Surgeon: Christena Flake, MD;  Location: [facility] ORS;  Service: Orthopedics;  Laterality: Left;   ROTATOR CUFF REPAIR Right 2016   SHOULDER ARTHROSCOPY Right 06/22/2015   Procedure: ARTHROSCOPY SHOULDER, parital repair of rotator cuff, biceps tenodesis, decompression and debridement;  Surgeon: Christena Flake, MD;  Location: [facility] ORS;  Service: Orthopedics;  Laterality: Right;   TONSILLECTOMY  1954   VULVECTOMY     VULVECTOMY PARTIAL N/A 09/29/2015   Procedure: VULVECTOMY PARTIAL;  Surgeon: Artelia Laroche, MD;  Location: [facility] OR[facility];  Service: Gynecology;  Laterality: N/A;    Family History  Problem Relation Age of Onset   Diabetes Mother        died @ 59 of MI.   Hypertension Mother    Heart attack Mother    Melanoma Father 74       died of complications r/t melanoma w/ lung mets.   Kidney disease Sister        Kidney removed    Breast cancer Sister 78   Prostate cancer Brother        Prostate - dx in 70's   Heart disease Brother 10       reported MI @ age 97, ? treated w/ TPA->no recurrent CAD, now in 28's.   Hearing loss Maternal Aunt    Cancer Maternal Aunt        pancreatic vs colon cancer   Hearing loss Maternal Grandfather    Other Maternal Grandmother        flu pandemic   Kidney cancer Niece 31       partial nephrectomy    Social History   Socioeconomic History   Marital  status: Married    Spouse name: Not on file   Number of children: 2   Years of education: Not on file   Highest education level: Bachelor's degree (e.g., BA, AB, BS)  Occupational History   Occupation: Pharmacologist Records, [employer]    Employer: [employer]  Tobacco Use   Smoking status: Never   Smokeless tobacco: Never  Vaping Use   Vaping status: Never Used  Substance and Sexual Activity   Alcohol use: No    Alcohol/week: 0.0 standard drinks of alcohol  Drug use: No   Sexual activity: Not Currently    Partners: Male    Birth control/protection: Post-menopausal  Other Topics Concern   Not on file  Social History Narrative   Lives in [place] with husband, has 2 grown sons.  No pets.  Retired from [employer].  Activity limited by chronic back pain-sedentary.   Right-handed   Caffeine: occasional caffeine free/diet soda or hot tea   Social Drivers of Corporate investment banker Strain: Low Risk  (12/27/2023)   Overall Financial Resource Strain (CARDIA)    Difficulty of Paying Living Expenses: Not very hard  Food Insecurity: No Food Insecurity (12/27/2023)   Hunger Vital Sign    Worried About Running Out of Food in the Last Year: Never true    Ran Out of Food in the Last Year: Never true  Transportation Needs: No Transportation Needs (12/27/2023)   PRAPARE - Administrator, Civil Service (Medical): No    Lack of Transportation (Non-Medical): No  Physical Activity: Inactive (12/27/2023)   Exercise Vital Sign    Days of Exercise per Week: 0 days    Minutes of Exercise per Session: 10 min  Stress: No Stress Concern Present (12/27/2023)   [employer] of Occupational Health - Occupational Stress Questionnaire    Feeling of Stress : Not at all  Social Connections: Socially Integrated (12/27/2023)   Social Connection and Isolation Panel [NHANES]    Frequency of Communication with Friends and Family: More than three times a week    Frequency of Social  Gatherings with Friends and Family: Once a week    Attends Religious Services: More than 4 times per year    Active Member of [employer] or Organizations: Yes    Attends Engineer, structural: More than 4 times per year    Marital Status: Married  Catering manager Violence: Not At Risk (04/26/2023)   Humiliation, Afraid, Rape, and Kick questionnaire    Fear of Current or Ex-Partner: No    Emotionally Abused: No    Physically Abused: No    Sexually Abused: No     Outpatient Medications Prior to Visit  Medication Sig Dispense Refill   acetaminophen (TYLENOL) 500 MG tablet Take 1,000 mg by mouth in the morning and at bedtime.     anastrozole (ARIMIDEX) 1 MG tablet TAKE 1 TABLET BY MOUTH DAILY 90 tablet 1   aspirin EC 81 MG tablet Take 81 mg by mouth daily. Swallow whole.     atorvastatin (LIPITOR) 80 MG tablet TAKE 1 TABLET BY MOUTH DAILY 90 tablet 3   cholecalciferol (VITAMIN D3) 25 MCG (1000 UT) tablet Take 1,000 Units by mouth daily.     diazepam (VALIUM) 5 MG tablet Take 1 tablet (5 mg total) by mouth 60 (sixty) minutes before procedure for 1 dose. 1 tab PO 60 min pre-MRI. If still anxious, take 2nd tab 15 min just before MRI. Max: 2 tbs (10 mg). Avoid taking opioid pain medications within 4 hours of taking valium. Must have a driver. Do not drive or operate machinery x 24 hours after taking this medication. (Patient not taking: Reported on 01/02/2024) 2 tablet 0   ezetimibe (ZETIA) 10 MG tablet TAKE 1 TABLET BY MOUTH DAILY 90 tablet 3   hydrochlorothiazide (HYDRODIURIL) 12.5 MG tablet TAKE ONE TABLET BY MOUTH EVERY DAY 90 tablet 3   ketoconazole (NIZORAL) 2 % cream Apply twice daily as directed. (Patient taking differently: 1 Application daily as needed for irritation. Apply  twice daily as directed.) 60 g 2   lansoprazole (PREVACID) 30 MG capsule TAKE 1 CAPSULE BY MOUTH ONCE DAILY 90 capsule 1   levothyroxine (SYNTHROID) 88 MCG tablet TAKE 1 TABLET EVERY DAY ON EMPTY STOMACHWITH A GLASS  OF WATER AT LEAST 30-60 MINBEFORE BREAKFAST 90 tablet 3   lisinopril (ZESTRIL) 40 MG tablet TAKE 1 TABLET BY MOUTH DAILY 90 tablet 3   mometasone (ELOCON) 0.1 % cream APPLY TO ITCHY SPOTS ON BODY 1-2 TIMES DAILY UNTIL IMPROVED; AVOID FACE, GROIN,AND UNDERARMS 45 g 1   Multiple Vitamin (MULTIVITAMIN) tablet Take 1 tablet by mouth daily.     naproxen (NAPROSYN) 500 MG tablet TAKE 1 TABLET BY MOUTH TWICE DAILY AS NEEDED FOR MODERATE PAIN (TAKE WITH FOOD) 90 tablet 1   Specialty Vitamins Products (ICAPS LUTEIN & ZEAXANTHIN PO) Take 1 tablet by mouth every morning. Zeaxanthin 4mg /Lutein 10mg      tacrolimus (PROTOPIC) 0.1 % ointment Apply 1-2 times daily as needed for itch to affected areas. (Patient taking differently: 1 Application as needed. Apply 1-2 times daily as needed for itch to affected areas.) 60 g 2   No facility-administered medications prior to visit.    Allergies  Allergen Reactions   Morphine And Codeine Nausea And Vomiting and Nausea Only    Hallucinations   Sulfa Antibiotics Other (See Comments)    Dizzy/Fainting   Adhesive [Tape] Rash and Other (See Comments)    Including Bandaids-paper tape ok to use   Erythromycin Nausea And Vomiting   Oxycodone Nausea And Vomiting    ROS Review of Systems Negative unless indicated in HPI.    Objective:    Physical Exam Constitutional:      Appearance: Normal appearance.  Cardiovascular:     Rate and Rhythm: Normal rate and regular rhythm.     Pulses: Normal pulses.     Heart sounds: Normal heart sounds.  Musculoskeletal:     Cervical back: Normal range of motion.  Neurological:     General: No focal deficit present.     Mental Status: She is alert. Mental status is at baseline.  Psychiatric:        Mood and Affect: Mood normal.        Behavior: Behavior normal.        Thought Content: Thought content normal.        Judgment: Judgment normal.     BP 118/73   Pulse 73   Temp (!) 97.5 F (36.4 C)   Ht 5\' 3"  (1.6 m)    Wt 243 lb (110.2 kg)   SpO2 98%   BMI 43.05 kg/m  Wt Readings from Last 3 Encounters:  01/02/24 241 lb 12.8 oz (109.7 kg)  12/28/23 243 lb (110.2 kg)  12/27/23 240 lb (108.9 kg)     Health Maintenance  Topic Date Due   DTaP/Tdap/Td (2 - Td or Tdap) 10/06/2022   COVID-19 Vaccine (6 - 2024-25 season) 01/13/2024 (Originally 08/05/2023)   MAMMOGRAM  02/09/2024   Medicare Annual Wellness (AWV)  04/25/2024   Colonoscopy  01/14/2031   Pneumonia Vaccine 48+ Years old  Completed   INFLUENZA VACCINE  Completed   DEXA SCAN  Completed   Hepatitis C Screening  Completed   Zoster Vaccines- Shingrix  Completed   HPV VACCINES  Aged Out    There are no preventive care reminders to display for this patient.  Lab Results  Component Value Date   TSH 3.30 12/28/2023   Lab Results  Component Value Date  WBC 6.8 12/28/2023   HGB 13.1 12/28/2023   HCT 40.4 12/28/2023   MCV 89.6 12/28/2023   PLT 221.0 12/28/2023   Lab Results  Component Value Date   NA 140 12/28/2023   K 4.5 12/28/2023   CO2 29 12/28/2023   GLUCOSE 101 (H) 12/28/2023   BUN 28 (H) 12/28/2023   CREATININE 0.96 12/28/2023   BILITOT 0.4 12/28/2023   ALKPHOS 81 12/28/2023   AST 17 12/28/2023   ALT 14 12/28/2023   PROT 6.6 12/28/2023   ALBUMIN 3.9 12/28/2023   CALCIUM 9.5 12/28/2023   ANIONGAP 13 06/05/2023   GFR 58.53 (L) 12/28/2023   Lab Results  Component Value Date   CHOL 101 04/03/2022   Lab Results  Component Value Date   HDL 37 (L) 04/03/2022   Lab Results  Component Value Date   LDLCALC 40 04/03/2022   Lab Results  Component Value Date   TRIG 119 04/03/2022   Lab Results  Component Value Date   CHOLHDL 2.7 04/03/2022   Lab Results  Component Value Date   HGBA1C 5.8 (A) 01/26/2023      Assessment & Plan:  Other fatigue Assessment & Plan: Patient reports generalized weakness and fatigue, with a sensation of imbalance. Symptoms seem to worsen after eating. Patient has a history of  lymphoma and breast cancer, currently on hormone therapy. Also on levothyroxine for thyroid management. -Order CBC, metabolic panel, thyroid tests, magnesium, B12, and vitamin D levels to investigate potential causes of symptoms.  Orders: -     CBC with Differential/Platelet -     Comprehensive metabolic panel -     TSH -     VITAMIN D 25 Hydroxy (Vit-D Deficiency, Fractures) -     Vitamin B12 -     Magnesium    Follow-up: No follow-ups on file.   Kara Dies, NP

## 2023-12-28 NOTE — Patient Instructions (Signed)
Please go to the lab for blood work

## 2024-01-01 NOTE — Progress Notes (Unsigned)
Cardiology Office Note:  .   Date:  01/02/2024  ID:  [name], DOB Sep 02, 1951, MRN [id number] PCP: Glori Luis, MD  [facility] Providers Cardiologist:  Lorine Bears, MD    History of Present Illness: .   ALLEX MADIA is a 73 y.o. female with a past medical history of hypertension, obesity, TIA, lymphoma status postchemotherapy and stem cell transplant, osteoarthritis, gastroesophageal reflux disease, Paget's disease of the vulva status post partial vulvectomy, hypothyroidism, chronic fatigue, chronic low back pain, and dyspnea, who presents today for follow-up.   History of hypertension dates back to at least 2008.  In the setting of abnormal EKG she underwent stress testing in June 2011 which resulted in a low risk study.  Check significant weight gain following rotator cuff repair in July 2016 with subsequent inactivity.  Echocardiogram was completed in December 2017 revealed an LVEF of 60 to 65%, mild LVH, G1 DD, and trivial MR.  March 2018 she was admitted for TIA.  CT and MR were negative for stroke.  She underwent outpatient event monitoring which did not show atrial fibrillation flutter or significant arrhythmia.   Repeat echocardiogram in 03/2022 revealed an LVEF 60 to 65%, no RWMA, mild mitral valve regurgitation.  Outpatient cardiac monitor showed predominant rhythm of sinus with an average rate    She was last seen in clinic 12/06/2022 stating that she been doing very well.  She had recently undergone shoulder surgery and continued to be in a sling.  She been continued on her current medication regimen without any further changes needed.  There was also no further testing that was ordered.  She was evaluated in the [facility] emergency department 06/05/2023 after an episode of loss of consciousness.  According to patient show strong history of syncopal episodes and was actually in the emergency department.  Husband reports patient was became lightheaded and flushed  syncopal episode.  She initially decided not to be seen but states she was concerned that she was dehydrated and decided to be checked in the emergency department.  Her blood pressure was 123/52 with a pulse of 56.  She received 1 L of normal saline.  Blood work revealed mild renal insufficiency with an anion gap of 13 possibly indicating mild dehydration.  CBC, troponin, EKG were all unremarkable.  She was started on Keflex as a precaution as her urine analysis showed greater than 50 white cells and rare bacteria.  She was able to be discharged from the facility.  She returns to clinic today stating that overall she has been doing fairly well.  She is extremely tired this morning due to having to sit in the emergency department at [facility] all evening with her husband pending admission.  She denies any chest pain or shortness of breath.  She does endorse continued fatigue.  She has had recent labs done with her PCP and has an upcoming appointment with her oncologist as she has concerns that her lymphoma has returned.  She states that she had continuous swelling when she was on amlodipine for blood pressure and after further discussion with her PCP her amlodipine was discontinued and she was started on lisinopril and she denies any swelling since that time.  She states that she has been compliant with her current medication regimen without adverse side effects.  She denies any recent hospitalizations or visits to the emergency department for self.  ROS: 10 point review of system has been reviewed and considered negative except what  is been listed in the HPI  Studies Reviewed: Marland Kitchen   EKG Interpretation Date/Time:  Wednesday January 02 2024 10:44:15 EST Ventricular Rate:  90 PR Interval:  168 QRS Duration:  56 QT Interval:  348 QTC Calculation: 425 R Axis:   -12  Text Interpretation: Normal sinus rhythm Minimal voltage criteria for LVH, may be normal variant ( R in aVL ) When compared with ECG of  05-Jun-2023 01:10, PREVIOUS ECG IS PRESENT Confirmed by Charlsie Quest ([id number]) on 01/02/2024 10:51:45 AM    2D echo 03/08/2022 1. Left ventricular ejection fraction, by estimation, is 60 to 65%. The  left ventricle has normal function. The left ventricle has no regional  wall motion abnormalities. Left ventricular diastolic parameters were  normal. The average left ventricular  global longitudinal strain is -18.9 %. The global longitudinal strain is  normal.   2. Right ventricular systolic function is normal. The right ventricular  size is normal.   3. The mitral valve is normal in structure. Mild mitral valve  regurgitation.   4. The aortic valve was not well visualized. Aortic valve regurgitation  is not visualized. Aortic valve sclerosis is present, with no evidence of  aortic valve stenosis.   5. The inferior vena cava is normal in size with greater than 50%  respiratory variability, suggesting right atrial pressure of 3 mmHg.  Risk Assessment/Calculations:             Physical Exam:   VS:  BP 99/67 Comment: right forearm  Pulse 90   Ht 5\' 3"  (1.6 m)   Wt 241 lb 12.8 oz (109.7 kg)   SpO2 95%   BMI 42.83 kg/m    Wt Readings from Last 3 Encounters:  01/02/24 241 lb 12.8 oz (109.7 kg)  12/28/23 243 lb (110.2 kg)  12/27/23 240 lb (108.9 kg)    GEN: Well nourished, well developed in no acute distress NECK: No JVD; No carotid bruits CARDIAC: RRR, II/VI systolic murmur R/LUSB without rubs or gallops RESPIRATORY:  Clear to auscultation without rales, wheezing or rhonchi  ABDOMEN: Soft, non-tender,obese, non-distended EXTREMITIES: Trace pretibial edema; No deformity   ASSESSMENT AND PLAN: .   Vasovagal syncope with a longstanding history.  Patient states that she was evaluated in the emergency department in July and required treatment for UTI and was given IV fluids.  She also had a fainting episode recently at the site of blood and pain from removing a bandage from her arm.  We  discussed her previous monitor results and with her have been episodes so infrequent we have deferred any recurrent monitor for use at this time.  History of SVT where EKG today revealed sinus rhythm with a rate of 90 with a left axis deviation with no acute changes.  Currently patient is asymptomatic with no further events noted.  Primary hypertension with a blood pressure of 99/67 and repeated 122/64.  Blood pressures remain stable.  She has been continued on lisinopril 40 mg daily and HCTZ 12.5 mg daily.  She has been encouraged to continue to monitor pressure 1 to 2 hours postmedication administration at home as well.  Hyperlipidemia with last LDL of 40.  Physical is upcoming with her PCP in several months which for she will have more labs drawn.  She is continued on atorvastatin 80 mg daily.  Recurrent TIA with no evidence of cardiac source.  Echocardiogram was previously unremarkable.  Previously declined beta-blocker therapy or implantation of loop recorder.  She is continued on aspirin  81 mg daily atorvastatin 80 mg daily and ezetimibe 10 mg daily.  Morbid obesity with a BMI of 42.83.  Activity is very limited by chronic back pain       Dispo: Patient return to clinic to see MD/APP in 6 months or sooner if needed for reevaluation of symptoms.  Signed, Gwynneth Fabio, NP

## 2024-01-02 ENCOUNTER — Encounter: Payer: Self-pay | Admitting: Cardiology

## 2024-01-02 ENCOUNTER — Ambulatory Visit: Payer: Medicare Other | Attending: Cardiology | Admitting: Cardiology

## 2024-01-02 VITALS — BP 99/67 | HR 90 | Ht 63.0 in | Wt 241.8 lb

## 2024-01-02 DIAGNOSIS — E782 Mixed hyperlipidemia: Secondary | ICD-10-CM

## 2024-01-02 DIAGNOSIS — Z8673 Personal history of transient ischemic attack (TIA), and cerebral infarction without residual deficits: Secondary | ICD-10-CM | POA: Diagnosis not present

## 2024-01-02 DIAGNOSIS — I10 Essential (primary) hypertension: Secondary | ICD-10-CM

## 2024-01-02 DIAGNOSIS — I471 Supraventricular tachycardia, unspecified: Secondary | ICD-10-CM | POA: Diagnosis not present

## 2024-01-02 DIAGNOSIS — R55 Syncope and collapse: Secondary | ICD-10-CM

## 2024-01-02 DIAGNOSIS — Z6841 Body Mass Index (BMI) 40.0 and over, adult: Secondary | ICD-10-CM

## 2024-01-02 NOTE — Patient Instructions (Signed)
Medication Instructions:  No changes *If you need a refill on your cardiac medications before your next appointment, please call your pharmacy*  Lab Work: No labs  Testing/Procedures: No Testing  Follow-Up: At [facility], you and your health needs are our priority.  As part of our continuing mission to provide you with exceptional heart care, we have created designated Provider Care Teams.  These Care Teams include your primary Cardiologist (physician) and Advanced Practice Providers (APPs -  Physician Assistants and Nurse Practitioners) who all work together to provide you with the care you need, when you need it.  We recommend signing up for the patient portal called "MyChart".  Sign up information is provided on this After Visit Summary.  MyChart is used to connect with patients for Virtual Visits (Telemedicine).  Patients are able to view lab/test results, encounter notes, upcoming appointments, etc.  Non-urgent messages can be sent to your provider as well.   To learn more about what you can do with MyChart, go to [email or web address].    Your next appointment:   6 months  Provider:   Lorine Bears, MD  Other Instructions

## 2024-01-04 NOTE — Assessment & Plan Note (Signed)
Patient reports generalized weakness and fatigue, with a sensation of imbalance. Symptoms seem to worsen after eating. Patient has a history of lymphoma and breast cancer, currently on hormone therapy. Also on levothyroxine for thyroid management. -Order CBC, metabolic panel, thyroid tests, magnesium, B12, and vitamin D levels to investigate potential causes of symptoms.

## 2024-01-08 ENCOUNTER — Ambulatory Visit: Payer: Self-pay | Admitting: Family Medicine

## 2024-01-08 ENCOUNTER — Telehealth: Payer: Self-pay

## 2024-01-08 NOTE — Telephone Encounter (Signed)
Copied from CRM [id number]. Topic: Clinical - Lab/Test Results >> Jan 08, 2024  3:10 PM Bailey Brooks wrote: Reason for CRM: Patient is requesting a callback regarding test results.

## 2024-01-08 NOTE — Telephone Encounter (Signed)
 Copied from CRM [id number]. Topic: Clinical - Red Word Triage >> Jan 08, 2024  3:13 PM Drema MATSU wrote: Red Word that prompted transfer to Nurse Triage: Patient has a wound on her arm that won't heal. She states that the skin peeled from it and it is painful. She states that she puts a a cover on it when she leaves the house and it bleeds. She cant get it to scab over.   Chief Complaint: Skin tear Symptoms: Skin tear, surrounding redness Frequency: Constant x1 week Pertinent Negatives: Patient denies fever, red streaking from wound Disposition: [] ED /[] Urgent Care (no appt availability in office) / [x] Appointment(In office/virtual)/ []  [facility]/ [] Home Care/ [] Refused Recommended Disposition /[] [facility]/ []  Follow-up with PCP Additional Notes: Patient has a wound on her left arm above the wrist from 1 week ago. She states that the wound is 2 inches long and 1/2 an in wide. She states she left the wound open to air but covers it when leaving the house. She states that she is still experiencing intermittent bleeding from the tear and states that there is surrounding redness for the last 2 days. She denies any fevers and states that there are no red streaks from the wound up or down her arm. Patient given home care advice and appointment made for tomorrow.    Reason for Disposition  [1] Looks infected AND [2] large red area (> 2 inches or 5 cm) or streak    Appointment tomorrow morning  Answer Assessment - Initial Assessment Questions 1. DESCRIPTION: What does the injury look like?  Is there any part of the skin missing?  If yes, How much of the skin flap is missing?  (25%, 50%, 75%, all)     Skin flap missing, surrounding redness 2. SIZE: How large is the skin tear?     2 inches x 1/2 inch  3. BLEEDING: Is it bleeding now? If Yes, ask: Is it difficult to stop?     Yes, difficult to stop bleeding 4. LOCATION: Where is the skin tear located?     Left arm  just above wrist 5. ONSET: How long ago did the skin tear occur?     1 week 6. MECHANISM: Tell me how it happened.     Happened while helping husband during a fall  Protocols used: Skin Tear-A-AH

## 2024-01-09 ENCOUNTER — Encounter: Payer: Self-pay | Admitting: Nurse Practitioner

## 2024-01-09 ENCOUNTER — Ambulatory Visit (INDEPENDENT_AMBULATORY_CARE_PROVIDER_SITE_OTHER): Payer: Medicare Other | Admitting: Nurse Practitioner

## 2024-01-09 VITALS — BP 127/78 | HR 85 | Temp 98.1°F | Ht 63.0 in | Wt 243.0 lb

## 2024-01-09 DIAGNOSIS — Z23 Encounter for immunization: Secondary | ICD-10-CM

## 2024-01-09 DIAGNOSIS — S51812A Laceration without foreign body of left forearm, initial encounter: Secondary | ICD-10-CM | POA: Diagnosis not present

## 2024-01-09 NOTE — Assessment & Plan Note (Addendum)
 A large skin tear on the left forearm shows intermittent bleeding, bruising, and pain when the scab is disturbed. There are no signs of infection. Cleaned in office with sterile water and iodine swab. Non-stick gauze and Tegaderm applied. Advised to leave Tegaderm in place for 1-2 days then clean the wound twice daily with antibacterial soap. Keep it dry and uncovered as much as possible, but cover it when going out or during activities that may expose it to dirt or contaminants. Advised not to use any creams or ointments on area. Return for follow-up in one week to assess healing progress. Administer the tetanus shot today as it has been due since 2023.

## 2024-01-09 NOTE — Telephone Encounter (Signed)
 Noted.  Patient has an appointment scheduled for today.

## 2024-01-09 NOTE — Progress Notes (Signed)
 Leron Glance, NP-C Phone: [phone]  Bailey Brooks is a 73 y.o. female who presents today for skin tear.   Discussed the use of AI scribe software for clinical note transcription with the patient, who gave verbal consent to proceed.  History of Present Illness   Bailey Brooks is a 73 year old female who presents with a skin tear on her left forearm.  She sustained the skin tear last Tuesday night while attempting to support her husband during a fainting episode, scraping her arm on a dresser. The injury is painful and has been bleeding, with a red area around it, possibly from tape. She has been keeping it uncovered since yesterday and throughout the night.  Initially, EMTs wrapped the skin tear in gauze when they arrived for her husband. When a nurse later removed the gauze, her skin came off with it, causing her to feel woozy and faint. She has a history of fainting when nauseous, having experienced this with a gastrointestinal virus in the past. She bruises easily and has had many skin tears before, but none as large as this one. She is concerned about the current skin tear not healing, as it has been a week since the injury.  She has been using nonstick gauze, but it has not been very effective. She is not on any blood thinners. She is allergic to adhesive, which complicates the use of certain bandages.      Social History   Tobacco Use  Smoking Status Never  Smokeless Tobacco Never    Current Outpatient Medications on File Prior to Visit  Medication Sig Dispense Refill   acetaminophen  (TYLENOL ) 500 MG tablet Take 1,000 mg by mouth in the morning and at bedtime.     anastrozole  (ARIMIDEX ) 1 MG tablet TAKE 1 TABLET BY MOUTH DAILY 90 tablet 1   aspirin  EC 81 MG tablet Take 81 mg by mouth daily. Swallow whole.     atorvastatin  (LIPITOR ) 80 MG tablet TAKE 1 TABLET BY MOUTH DAILY 90 tablet 3   cholecalciferol  (VITAMIN D3) 25 MCG (1000 UT) tablet Take 1,000 Units by mouth  daily.     diazepam  (VALIUM ) 5 MG tablet Take 1 tablet (5 mg total) by mouth 60 (sixty) minutes before procedure for 1 dose. 1 tab PO 60 min pre-MRI. If still anxious, take 2nd tab 15 min just before MRI. Max: 2 tbs (10 mg). Avoid taking opioid pain medications within 4 hours of taking valium . Must have a driver. Do not drive or operate machinery x 24 hours after taking this medication. (Patient not taking: Reported on 01/02/2024) 2 tablet 0   ezetimibe  (ZETIA ) 10 MG tablet TAKE 1 TABLET BY MOUTH DAILY 90 tablet 3   hydrochlorothiazide  (HYDRODIURIL ) 12.5 MG tablet TAKE ONE TABLET BY MOUTH EVERY DAY 90 tablet 3   ketoconazole  (NIZORAL ) 2 % cream Apply twice daily as directed. (Patient taking differently: 1 Application daily as needed for irritation. Apply twice daily as directed.) 60 g 2   lansoprazole  (PREVACID ) 30 MG capsule TAKE 1 CAPSULE BY MOUTH ONCE DAILY 90 capsule 1   levothyroxine  (SYNTHROID ) 88 MCG tablet TAKE 1 TABLET EVERY DAY ON EMPTY STOMACHWITH A GLASS OF WATER AT LEAST 30-60 MINBEFORE BREAKFAST 90 tablet 3   lisinopril  (ZESTRIL ) 40 MG tablet TAKE 1 TABLET BY MOUTH DAILY 90 tablet 3   mometasone  (ELOCON ) 0.1 % cream APPLY TO ITCHY SPOTS ON BODY 1-2 TIMES DAILY UNTIL IMPROVED; AVOID FACE, GROIN,AND UNDERARMS 45 g 1   Multiple  Vitamin (MULTIVITAMIN) tablet Take 1 tablet by mouth daily.     naproxen  (NAPROSYN ) 500 MG tablet TAKE 1 TABLET BY MOUTH TWICE DAILY AS NEEDED FOR MODERATE PAIN (TAKE WITH FOOD) 90 tablet 1   Specialty Vitamins Products (ICAPS LUTEIN  & ZEAXANTHIN PO) Take 1 tablet by mouth every morning. Zeaxanthin 4mg /Lutein  10mg      tacrolimus  (PROTOPIC ) 0.1 % ointment Apply 1-2 times daily as needed for itch to affected areas. (Patient taking differently: 1 Application as needed. Apply 1-2 times daily as needed for itch to affected areas.) 60 g 2   No current facility-administered medications on file prior to visit.     ROS see history of present illness  Objective  Physical  Exam Vitals:   01/09/24 0937  BP: 127/78  Pulse: 85  Temp: 98.1 F (36.7 C)  SpO2: 99%    BP Readings from Last 3 Encounters:  01/09/24 127/78  01/02/24 99/67  12/28/23 118/73   Wt Readings from Last 3 Encounters:  01/09/24 243 lb (110.2 kg)  01/02/24 241 lb 12.8 oz (109.7 kg)  12/28/23 243 lb (110.2 kg)    Physical Exam Constitutional:      General: She is not in acute distress.    Appearance: Normal appearance.  HENT:     Head: Normocephalic.  Skin:    General: Skin is warm and dry.     Comments: Skin tear present on left forearm. No signs of infection noted. See pic below.   Neurological:     General: No focal deficit present.     Mental Status: She is alert.  Psychiatric:        Mood and Affect: Mood normal.        Behavior: Behavior normal.      Assessment/Plan: Please see individual problem list.  Skin tear of left forearm without complication, initial encounter Assessment & Plan: A large skin tear on the left forearm shows intermittent bleeding, bruising, and pain when the scab is disturbed. There are no signs of infection. Cleaned in office with sterile water and iodine swab. Non-stick gauze and Tegaderm applied. Advised to leave Tegaderm in place for 1-2 days then clean the wound twice daily with antibacterial soap. Keep it dry and uncovered as much as possible, but cover it when going out or during activities that may expose it to dirt or contaminants. Advised not to use any creams or ointments on area. Return for follow-up in one week to assess healing progress. Administer the tetanus shot today as it has been due since 2023.   Orders: -     Td vaccine greater than or equal to 7yo preservative free IM   Return in about 1 week (around 01/16/2024) for Wound check.   Leron Glance, NP-C [facility]

## 2024-01-12 ENCOUNTER — Other Ambulatory Visit: Payer: Self-pay | Admitting: Family Medicine

## 2024-01-12 DIAGNOSIS — E039 Hypothyroidism, unspecified: Secondary | ICD-10-CM

## 2024-01-16 ENCOUNTER — Ambulatory Visit: Payer: Medicare Other | Admitting: Nurse Practitioner

## 2024-01-16 VITALS — BP 139/81 | HR 80 | Temp 97.6°F | Ht 63.0 in | Wt 244.6 lb

## 2024-01-16 DIAGNOSIS — S51812D Laceration without foreign body of left forearm, subsequent encounter: Secondary | ICD-10-CM

## 2024-01-16 NOTE — Assessment & Plan Note (Signed)
The skin tear is healing well with no signs of infection or drainage. Scab formation is noted, and tenderness around the area is likely due to bruising. Continue the current wound care regimen. Avoid picking at the scab to prevent bleeding. Notify the clinic if signs of infection such as red streaks, increased heat, or discharge occur.

## 2024-01-16 NOTE — Progress Notes (Signed)
Bethanie Dicker, NP-C Phone: [phone]  Bailey Brooks is a 73 y.o. female who presents today for wound check.   Discussed the use of AI scribe software for clinical note transcription with the patient, who gave verbal consent to proceed.  History of Present Illness   Bailey Brooks is a 73 year old female who presents for a follow-up on a skin tear on her left forearm.  The skin tear on her left forearm is improving, with no drainage or oozing. The area remains tender and bruised, and a part of the scab fell off this morning. The wound is mostly dried up, and she is happier with its current state.  No fevers, chills, or drainage from the wound. The wound is not affecting her sleep. The back of the wound appears Brooks, which she attributes to the scab rather than necrotic tissue.  She has been leaving the wound open, even at night, and covers it only when going out for extended periods. She used non stick gauze and tegaderm to cover the wound yesterday morning but found them expensive. She is trying to avoid picking at the scab to prevent bleeding.      Social History   Tobacco Use  Smoking Status Never  Smokeless Tobacco Never    Current Outpatient Medications on File Prior to Visit  Medication Sig Dispense Refill   acetaminophen (TYLENOL) 500 MG tablet Take 1,000 mg by mouth in the morning and at bedtime.     anastrozole (ARIMIDEX) 1 MG tablet TAKE 1 TABLET BY MOUTH DAILY 90 tablet 1   aspirin EC 81 MG tablet Take 81 mg by mouth daily. Swallow whole.     atorvastatin (LIPITOR) 80 MG tablet TAKE 1 TABLET BY MOUTH DAILY 90 tablet 3   cholecalciferol (VITAMIN D3) 25 MCG (1000 UT) tablet Take 1,000 Units by mouth daily.     diazepam (VALIUM) 5 MG tablet Take 1 tablet (5 mg total) by mouth 60 (sixty) minutes before procedure for 1 dose. 1 tab PO 60 min pre-MRI. If still anxious, take 2nd tab 15 min just before MRI. Max: 2 tbs (10 mg). Avoid taking opioid pain medications within 4  hours of taking valium. Must have a driver. Do not drive or operate machinery x 24 hours after taking this medication. (Patient not taking: Reported on 01/02/2024) 2 tablet 0   ezetimibe (ZETIA) 10 MG tablet TAKE 1 TABLET BY MOUTH DAILY 90 tablet 3   hydrochlorothiazide (HYDRODIURIL) 12.5 MG tablet TAKE ONE TABLET BY MOUTH EVERY DAY 90 tablet 3   ketoconazole (NIZORAL) 2 % cream Apply twice daily as directed. (Patient taking differently: 1 Application daily as needed for irritation. Apply twice daily as directed.) 60 g 2   lansoprazole (PREVACID) 30 MG capsule TAKE 1 CAPSULE BY MOUTH ONCE DAILY 90 capsule 1   levothyroxine (SYNTHROID) 88 MCG tablet TAKE 1 TABLET BY MOUTH ONCE DAILY. TAKE ON EMPTY STOMACH WITH A GLASS OF WATER AT LEAST 30-60 MINUTES BEFORE BREAKFAST 90 tablet 3   lisinopril (ZESTRIL) 40 MG tablet TAKE 1 TABLET BY MOUTH DAILY 90 tablet 3   mometasone (ELOCON) 0.1 % cream APPLY TO ITCHY SPOTS ON BODY 1-2 TIMES DAILY UNTIL IMPROVED; AVOID FACE, GROIN,AND UNDERARMS 45 g 1   Multiple Vitamin (MULTIVITAMIN) tablet Take 1 tablet by mouth daily.     naproxen (NAPROSYN) 500 MG tablet TAKE 1 TABLET BY MOUTH TWICE DAILY AS NEEDED FOR MODERATE PAIN (TAKE WITH FOOD) 90 tablet 1   Specialty Vitamins  Products (ICAPS LUTEIN & ZEAXANTHIN PO) Take 1 tablet by mouth every morning. Zeaxanthin 4mg /Lutein 10mg      tacrolimus (PROTOPIC) 0.1 % ointment Apply 1-2 times daily as needed for itch to affected areas. (Patient taking differently: 1 Application as needed. Apply 1-2 times daily as needed for itch to affected areas.) 60 g 2   No current facility-administered medications on file prior to visit.    ROS see history of present illness  Objective  Physical Exam Vitals:   01/16/24 1050 01/16/24 1059  BP: (!) 149/82 139/81  Pulse: 80   Temp: 97.6 F (36.4 C)   SpO2: 95%     BP Readings from Last 3 Encounters:  01/16/24 139/81  01/09/24 127/78  01/02/24 99/67   Wt Readings from Last 3  Encounters:  01/16/24 244 lb 9.6 oz (110.9 kg)  01/09/24 243 lb (110.2 kg)  01/02/24 241 lb 12.8 oz (109.7 kg)    Physical Exam Constitutional:      General: She is not in acute distress.    Appearance: Normal appearance.  HENT:     Head: Normocephalic.  Skin:    General: Skin is warm and dry.     Comments: Skin tear present on left forearm. Healing well, scabbed over. No signs of infection- no warmth or drainage present. See pic below  Neurological:     General: No focal deficit present.     Mental Status: She is alert.  Psychiatric:        Mood and Affect: Mood normal.        Behavior: Behavior normal.      Assessment/Plan: Please see individual problem list.  Skin tear of left forearm without complication, subsequent encounter Assessment & Plan: The skin tear is healing well with no signs of infection or drainage. Scab formation is noted, and tenderness around the area is likely due to bruising. Continue the current wound care regimen. Avoid picking at the scab to prevent bleeding. Notify the clinic if signs of infection such as red streaks, increased heat, or discharge occur.     Return if symptoms worsen or fail to improve.   Bethanie Dicker, NP-C [facility]

## 2024-01-23 ENCOUNTER — Inpatient Hospital Stay: Payer: Medicare Other | Admitting: Internal Medicine

## 2024-01-23 ENCOUNTER — Inpatient Hospital Stay: Payer: Medicare Other | Attending: Internal Medicine

## 2024-02-04 ENCOUNTER — Other Ambulatory Visit: Payer: Self-pay | Admitting: Internal Medicine

## 2024-02-11 ENCOUNTER — Ambulatory Visit
Admission: RE | Admit: 2024-02-11 | Discharge: 2024-02-11 | Disposition: A | Discharge status: Home / Self Care | Payer: Medicare Other | Source: Ambulatory Visit | Attending: Internal Medicine | Admitting: Internal Medicine

## 2024-02-11 DIAGNOSIS — C50411 Malignant neoplasm of upper-outer quadrant of right female breast: Secondary | ICD-10-CM | POA: Insufficient documentation

## 2024-02-11 DIAGNOSIS — Z170 Estrogen receptor positive status [ER+]: Secondary | ICD-10-CM

## 2024-02-11 DIAGNOSIS — Z1231 Encounter for screening mammogram for malignant neoplasm of breast: Secondary | ICD-10-CM | POA: Diagnosis not present

## 2024-02-18 ENCOUNTER — Other Ambulatory Visit: Payer: Self-pay

## 2024-02-18 ENCOUNTER — Other Ambulatory Visit: Payer: Self-pay | Admitting: Family Medicine

## 2024-02-18 DIAGNOSIS — M51379 Other intervertebral disc degeneration, lumbosacral region without mention of lumbar back pain or lower extremity pain: Secondary | ICD-10-CM

## 2024-02-18 MED ORDER — NAPROXEN 500 MG PO TABS: ORAL_TABLET | ORAL | 1 refills | Status: DC

## 2024-02-18 NOTE — Progress Notes (Signed)
 Called pt and informed her that medication has been sent in

## 2024-02-22 ENCOUNTER — Encounter: Payer: Medicare Other | Admitting: Family Medicine

## 2024-02-22 ENCOUNTER — Inpatient Hospital Stay: Payer: Medicare Other | Attending: Internal Medicine

## 2024-02-22 ENCOUNTER — Ambulatory Visit: Payer: Medicare Other | Admitting: Family Medicine

## 2024-02-22 ENCOUNTER — Encounter: Payer: Self-pay | Admitting: Internal Medicine

## 2024-02-22 ENCOUNTER — Inpatient Hospital Stay: Payer: Medicare Other | Admitting: Internal Medicine

## 2024-02-22 DIAGNOSIS — Z803 Family history of malignant neoplasm of breast: Secondary | ICD-10-CM | POA: Diagnosis not present

## 2024-02-22 DIAGNOSIS — Z79811 Long term (current) use of aromatase inhibitors: Secondary | ICD-10-CM | POA: Diagnosis not present

## 2024-02-22 DIAGNOSIS — Z808 Family history of malignant neoplasm of other organs or systems: Secondary | ICD-10-CM | POA: Insufficient documentation

## 2024-02-22 DIAGNOSIS — Z8051 Family history of malignant neoplasm of kidney: Secondary | ICD-10-CM | POA: Insufficient documentation

## 2024-02-22 DIAGNOSIS — C50411 Malignant neoplasm of upper-outer quadrant of right female breast: Secondary | ICD-10-CM | POA: Insufficient documentation

## 2024-02-22 DIAGNOSIS — Z8042 Family history of malignant neoplasm of prostate: Secondary | ICD-10-CM | POA: Diagnosis not present

## 2024-02-22 DIAGNOSIS — Z1732 Human epidermal growth factor receptor 2 negative status: Secondary | ICD-10-CM | POA: Diagnosis not present

## 2024-02-22 DIAGNOSIS — Z1721 Progesterone receptor positive status: Secondary | ICD-10-CM | POA: Insufficient documentation

## 2024-02-22 DIAGNOSIS — Z8673 Personal history of transient ischemic attack (TIA), and cerebral infarction without residual deficits: Secondary | ICD-10-CM | POA: Insufficient documentation

## 2024-02-22 DIAGNOSIS — Z170 Estrogen receptor positive status [ER+]: Secondary | ICD-10-CM | POA: Diagnosis not present

## 2024-02-22 DIAGNOSIS — Z90710 Acquired absence of both cervix and uterus: Secondary | ICD-10-CM | POA: Insufficient documentation

## 2024-02-22 LAB — CBC WITH DIFFERENTIAL (CANCER CENTER ONLY)
Abs Immature Granulocytes: 0.04 10*3/uL (ref 0.00–0.07)
Basophils Absolute: 0 10*3/uL (ref 0.0–0.1)
Basophils Relative: 1 %
Eosinophils Absolute: 0.1 10*3/uL (ref 0.0–0.5)
Eosinophils Relative: 1 %
HCT: 39.7 % (ref 36.0–46.0)
Hemoglobin: 12.9 g/dL (ref 12.0–15.0)
Immature Granulocytes: 1 %
Lymphocytes Relative: 31 %
Lymphs Abs: 1.9 10*3/uL (ref 0.7–4.0)
MCH: 29.3 pg (ref 26.0–34.0)
MCHC: 32.5 g/dL (ref 30.0–36.0)
MCV: 90.2 fL (ref 80.0–100.0)
Monocytes Absolute: 0.4 10*3/uL (ref 0.1–1.0)
Monocytes Relative: 7 %
Neutro Abs: 3.9 10*3/uL (ref 1.7–7.7)
Neutrophils Relative %: 59 %
Platelet Count: 210 10*3/uL (ref 150–400)
RBC: 4.4 MIL/uL (ref 3.87–5.11)
RDW: 14 % (ref 11.5–15.5)
WBC Count: 6.4 10*3/uL (ref 4.0–10.5)
nRBC: 0 % (ref 0.0–0.2)

## 2024-02-22 LAB — CMP (CANCER CENTER ONLY)
ALT: 19 U/L (ref 0–44)
AST: 22 U/L (ref 15–41)
Albumin: 3.5 g/dL (ref 3.5–5.0)
Alkaline Phosphatase: 78 U/L (ref 38–126)
Anion gap: 9 (ref 5–15)
BUN: 31 mg/dL — ABNORMAL HIGH (ref 8–23)
CO2: 26 mmol/L (ref 22–32)
Calcium: 9.2 mg/dL (ref 8.9–10.3)
Chloride: 103 mmol/L (ref 98–111)
Creatinine: 1.08 mg/dL — ABNORMAL HIGH (ref 0.44–1.00)
GFR, Estimated: 54 mL/min — ABNORMAL LOW (ref >60)
Glucose, Bld: 152 mg/dL — ABNORMAL HIGH (ref 70–99)
Potassium: 4.1 mmol/L (ref 3.5–5.1)
Sodium: 138 mmol/L (ref 135–145)
Total Bilirubin: 0.6 mg/dL (ref 0.0–1.2)
Total Protein: 6.7 g/dL (ref 6.5–8.1)

## 2024-02-22 LAB — VITAMIN D 25 HYDROXY (VIT D DEFICIENCY, FRACTURES): Vit D, 25-Hydroxy: 65.79 ng/mL (ref 30–100)

## 2024-02-22 NOTE — Progress Notes (Signed)
 Mammogram 02/12/24.  Had some injections in her back and hip at [facility] pain management.  C/o weakness in muscles, was concerned it may be symptoms of lymphoma or the injection she had for her back.  C/o hot flashes.

## 2024-02-22 NOTE — Progress Notes (Signed)
 [facility] OFFICE PROGRESS NOTE  Patient Care Team: Glori Luis, MD (Inactive) as PCP - General (Family Medicine) Iran Ouch, MD as PCP - Cardiology (Cardiology) Benita Gutter, RN as Registered Nurse Earna Coder, MD as Consulting Physician (Oncology)   Cancer Staging  No matching staging information was found for the patient.    Oncology History Overview Note  # RIGHT BREAST CANCER stage I [pTapN0; G-1; ER-100%; PR-99%; Her-2-NEG; Dr.Sakai]. NO Oncotype;s/p RT [12/29/2019]  # MARCH 1st 2021- Anastrzole;  STOPPED anastrozole [stopped in April, 2023]; June 2nd, 2023- Examestane 25 mg/day.     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   DIAGNOSIS: Right breast  STAGE:   I      ;  GOALS: cure    History of lymphoma  Carcinoma of upper-outer quadrant of right breast in female, estrogen receptor positive (HCC)  10/28/2019 Initial Diagnosis   Carcinoma of upper-outer quadrant of right breast in female, estrogen receptor positive (HCC)   03/20/2020 Genetic Testing   Negative genetic testing on the multi-cancer panel.  The Multi-Gene Panel offered by [facility] includes sequencing and/or deletion duplication testing of the following 85 genes: AIP, ALK, APC, ATM, AXIN2,BAP1,  BARD1, BLM, BMPR1A, BRCA1, BRCA2, BRIP1, CASR, CDC73, CDH1, CDK4, CDKN1B, CDKN1C, CDKN2A (p14ARF), CDKN2A (p16INK4a), CEBPA, CHEK2, CTNNA1, DICER1, DIS3L2, EGFR (c.2369C>T, p.Thr790Met variant only), EPCAM (Deletion/duplication testing only), FH, FLCN, GATA2, GPC3, GREM1 (Promoter region deletion/duplication testing only), HOXB13 (c.251G>A, p.Gly84Glu), HRAS,  KIT, MAX, MEN1, MET, MITF (c.952G>A, p.Glu318Lys variant only), MLH1, MSH2, MSH3, MSH6, MUTYH, NBN, NF1, NF2, NTHL1, PALB2, PDGFRA, PHOX2B, PMS2, POLD1, POLE, POT1, PRKAR1A, PTCH1, PTEN, RAD50, RAD51C, RAD51D, RB1, RECQL4, RET, RNF43, RUNX1, SDHAF2, SDHA (sequence changes only), SDHB, SDHC, SDHD, SMAD4, SMARCA4, SMARCB1, SMARCE1, STK11, SUFU, TERC, TERT, TMEM127, TP53, TSC1, TSC2, VHL, WRN and WT1.  The report date is March 20, 2020.    INTERVAL HISTORY: Ambulating independently.  Alone.   [name] 73 y.o.  female pleasant patient stage I ER/PR positive HER-2 negative breast cancer currently on adjuvant anatraszole is here for follow-up.  Mammogram 02/12/24.Had some injections in her back and hip at [facility]. C/o weakness in muscles, was concerned it may be symptoms of lymphoma or the injection she had for her back. Currently resolved.  Pt taking her anastrozole. Has aching in her joints and fatigue from this medication. Takes naproxen as needed for the pain.   Appetite is good. Denies any breast pain. No new lumps in breast.  C/o hot flashes.    Review of Systems  Constitutional:  Positive for malaise/fatigue. Negative for chills, diaphoresis and fever.  HENT:  Negative for nosebleeds and sore throat.   Eyes:  Negative for double vision.  Respiratory:  Negative for cough, hemoptysis, sputum production, shortness of breath and wheezing.   Cardiovascular:  Negative for chest pain, palpitations, orthopnea and leg swelling.  Gastrointestinal:  Negative for abdominal pain, blood in stool, constipation, diarrhea, heartburn, melena, nausea and vomiting.  Genitourinary:  Negative for dysuria, frequency and urgency.  Musculoskeletal:  Positive for back pain and joint pain.  Skin: Negative.  Negative for itching and rash.  Neurological:  Negative for dizziness, tingling, focal weakness, weakness and headaches.  Endo/Heme/Allergies:  Does not  bruise/bleed easily.   Psychiatric/Behavioral:  Negative for depression. The patient is not nervous/anxious and does not have insomnia.       PAST MEDICAL HISTORY :  Past Medical History:  Diagnosis Date   Adult pulmonary Langerhans cell histiocytosis (HCC)    Eosinophilic Granuloma of the Lung)   Allergic rhinitis    Antineoplastic chemotherapy induced anemia    Arthritis    Breast cancer, right (HCC) 09/17/2019   a.) Bx (+) stage 1 IMC (G1, ER/PR +, Her2/neu -); pT1a N0 M0; Tx'd with adjuvant XRT + endocrine therapy   Complication of anesthesia    pt was awake during intubation for colonoscopy   Diastolic dysfunction    a.) TTE 11/14/2016: EF 60-65%, no rwma, mild LVH, triv MR, mildly dil LA, nl RVSF, G1DD; b. 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   Hypercholesterolemia    Hypertension    Hypothyroidism    Long term current use of aromatase inhibitor    Morbid obesity (HCC)    Ommaya reservoir present 2012   a.) placed for intrathecal chemotherapy; remains in place as of 11/2022   Paget's disease of vulva (HCC) 05/13/2013   a.) Bx (+) IPN of vulva consistent with Pagets; resected; b.) s/p re-excision 09/29/2015   Pre-diabetes    Syncope    TIA (transient ischemic attack)    a.) s/p TIA 01/02/2011; b.) s/p TIA 02/10/2017; c.) s/p TIA 04/02/2022; d.)  02/2017 event monitor: No significant arrhythmias.   Tubular adenoma of colon     PAST SURGICAL HISTORY :   Past Surgical History:  Procedure Laterality Date   ABDOMINAL HYSTERECTOMY  1985   Hysterectomy-partial   BREAST BIOPSY Right 2002   Neg - AT [facility]   BREAST BIOPSY Right 2020   bx done at [facility]?, IDC and DCIS   BREAST LUMPECTOMY Right 10/16/2019   IDC and DCIS, negative LN   BURR HOLE W/ PLACEMENT OMMAYA RESERVOIR     BURR HOLE W/ PLACEMENT OMMAYA RESERVOIR  2012   COLONOSCOPY  04/2013   ESOPHAGOGASTRODUODENOSCOPY  04/2013   INJECTION KNEE  07/02/2018   INSERTION CENTRAL VENOUS ACCESS DEVICE W/ SUBCUTANEOUS PORT  2011   Port a Cath: Right chest Double Lumen, 04-Nov-2010   LIMBAL STEM CELL TRANSPLANT  2012   LIVER BIOPSY     stage 4B large Bcell lymphoma   PARTIAL MASTECTOMY WITH NEEDLE LOCALIZATION AND AXILLARY SENTINEL LYMPH NODE BX Right 10/16/2019   Procedure: PARTIAL MASTECTOMY WITH NEEDLE LOCALIZATION AND AXILLARY SENTINEL LYMPH NODE BX;  Surgeon: Sung Amabile, DO;  Location: [facility] ORS;  Service: General;  Laterality: Right;   REVERSE SHOULDER ARTHROPLASTY Left 11/14/2022   Procedure: REVERSE SHOULDER ARTHROPLASTY WITH BICEPS TENODESIS.;  Surgeon: Christena Flake, MD;  Location: [facility]  ORS;  Service: Orthopedics;  Laterality: Left;   ROTATOR CUFF REPAIR Right 2016   SHOULDER ARTHROSCOPY Right 06/22/2015   Procedure: ARTHROSCOPY SHOULDER, parital repair of rotator cuff, biceps tenodesis, decompression and debridement;  Surgeon: Christena Flake, MD;  Location: [facility] ORS;  Service: Orthopedics;  Laterality: Right;   TONSILLECTOMY  1954   VULVECTOMY     VULVECTOMY PARTIAL N/A 09/29/2015   Procedure: VULVECTOMY PARTIAL;  Surgeon: Artelia Laroche, MD;  Location: [facility] ORS;  Service: Gynecology;  Laterality: N/A;    FAMILY HISTORY :   Family History  Problem Relation Age of Onset   Diabetes Mother        died @ 21 of MI.   Hypertension Mother    Heart attack Mother    Melanoma  Father 9       died of complications r/t melanoma w/ lung mets.   Kidney disease Sister        Kidney removed    Breast cancer Sister 31   Prostate cancer Brother        Prostate - dx in 41's   Heart disease Brother 37       reported MI @ age 54, ? treated w/ TPA->no recurrent CAD, now in 70's.   Hearing loss Maternal Aunt    Cancer Maternal Aunt        pancreatic vs colon cancer   Hearing loss Maternal Grandfather    Other Maternal Grandmother        flu pandemic   Kidney cancer Niece 18       partial nephrectomy    SOCIAL HISTORY:   Social History   Tobacco Use   Smoking status: Never   Smokeless tobacco: Never  Vaping Use   Vaping status: Never Used  Substance Use Topics   Alcohol use: No    Alcohol/week: 0.0 standard drinks of alcohol   Drug use: No    ALLERGIES:  is allergic to morphine and codeine, sulfa antibiotics, adhesive [tape], erythromycin, and oxycodone.  MEDICATIONS:  Current Outpatient Medications  Medication Sig Dispense Refill   acetaminophen (TYLENOL) 500 MG tablet Take 1,000 mg by mouth in the morning and at bedtime.     anastrozole (ARIMIDEX) 1 MG tablet TAKE 1 TABLET BY MOUTH DAILY 90 tablet 1   aspirin EC 81 MG tablet Take 81 mg by mouth daily. Swallow whole.     atorvastatin (LIPITOR) 80 MG tablet TAKE 1 TABLET BY MOUTH DAILY 90 tablet 3   cholecalciferol (VITAMIN D3) 25 MCG (1000 UT) tablet Take 1,000 Units by mouth daily.     ezetimibe (ZETIA) 10 MG tablet TAKE 1 TABLET BY MOUTH DAILY 90 tablet 3   hydrochlorothiazide (HYDRODIURIL) 12.5 MG tablet TAKE ONE TABLET BY MOUTH EVERY DAY 90 tablet 3   ketoconazole (NIZORAL) 2 % cream Apply twice daily as directed. (Patient taking differently: 1 Application daily as needed for irritation. Apply twice daily as directed.) 60 g 2   lansoprazole (PREVACID) 30 MG capsule TAKE 1 CAPSULE BY MOUTH ONCE DAILY 90 capsule 1   levothyroxine (SYNTHROID) 88 MCG tablet TAKE 1 TABLET BY MOUTH ONCE DAILY. TAKE ON  EMPTY STOMACH WITH A GLASS OF WATER AT LEAST 30-60 MINUTES BEFORE BREAKFAST 90 tablet 3   lisinopril (ZESTRIL) 40 MG tablet TAKE 1 TABLET BY MOUTH DAILY 90 tablet 3   mometasone (ELOCON) 0.1 % cream APPLY TO ITCHY SPOTS ON BODY 1-2 TIMES DAILY UNTIL IMPROVED; AVOID FACE, GROIN,AND UNDERARMS 45 g 1  Multiple Vitamin (MULTIVITAMIN) tablet Take 1 tablet by mouth daily.     naproxen (NAPROSYN) 500 MG tablet TAKE 1 TABLET BY MOUTH TWICE DAILY AS NEEDED FOR MODERATE PAIN (TAKE WITH FOOD) 90 tablet 1   Specialty Vitamins Products (ICAPS LUTEIN & ZEAXANTHIN PO) Take 1 tablet by mouth every morning. Zeaxanthin 4mg /Lutein 10mg      tacrolimus (PROTOPIC) 0.1 % ointment Apply 1-2 times daily as needed for itch to affected areas. (Patient taking differently: 1 Application as needed. Apply 1-2 times daily as needed for itch to affected areas.) 60 g 2   No current facility-administered medications for this visit.   PHYSICAL EXAMINATION: ECOG PERFORMANCE STATUS: 0 - Asymptomatic  BP (!) 149/84 (BP Location: Right Arm, Patient Position: Sitting, Cuff Size: Normal)   Pulse 87   Temp 99.8 F (37.7 C) (Tympanic)   Resp 20   Ht 5\' 3"  (1.6 m)   Wt 245 lb 4.8 oz (111.3 kg)   SpO2 97%   BMI 43.45 kg/m   Filed Weights   02/22/24 1059  Weight: 245 lb 4.8 oz (111.3 kg)     Physical Activity: Inactive (12/27/2023)   Exercise Vital Sign    Days of Exercise per Week: 0 days    Minutes of Exercise per Session: 10 min   Physical Exam HENT:     Head: Normocephalic and atraumatic.     Mouth/Throat:     Pharynx: No oropharyngeal exudate.  Eyes:     Pupils: Pupils are equal, round, and reactive to light.  Cardiovascular:     Rate and Rhythm: Normal rate and regular rhythm.  Pulmonary:     Effort: No respiratory distress.     Breath sounds: No wheezing.  Abdominal:     General: Bowel sounds are normal. There is no distension.     Palpations: Abdomen is soft. There is no mass.     Tenderness: There is no  abdominal tenderness. There is no guarding or rebound.  Musculoskeletal:        General: No tenderness. Normal range of motion.     Cervical back: Normal range of motion and neck supple.  Skin:    General: Skin is warm.  Neurological:     Mental Status: She is alert and oriented to person, place, and time.  Psychiatric:        Mood and Affect: Affect normal.    LABORATORY DATA:  I have reviewed the data as listed    Component Value Date/Time   NA 138 02/22/2024 1041   NA 138 09/03/2013 0809   K 4.1 02/22/2024 1041   K 4.3 11/02/2014 0932   CL 103 02/22/2024 1041   CL 105 09/03/2013 0809   CO2 26 02/22/2024 1041   CO2 28 09/03/2013 0809   GLUCOSE 152 (H) 02/22/2024 1041   GLUCOSE 100 (H) 09/03/2013 0809   BUN 31 (H) 02/22/2024 1041   BUN 17 09/03/2013 0809   CREATININE 1.08 (H) 02/22/2024 1041   CREATININE 0.96 11/02/2014 0929   CALCIUM 9.2 02/22/2024 1041   CALCIUM 9.1 09/03/2013 0809   PROT 6.7 02/22/2024 1041   PROT 6.6 11/02/2014 0929   ALBUMIN 3.5 02/22/2024 1041   ALBUMIN 3.3 (L) 11/02/2014 0929   AST 22 02/22/2024 1041   ALT 19 02/22/2024 1041   ALT 19 11/02/2014 0929   ALKPHOS 78 02/22/2024 1041   ALKPHOS 87 11/02/2014 0929   BILITOT 0.6 02/22/2024 1041   GFRNONAA 54 (L) 02/22/2024 1041   GFRNONAA >60 11/02/2014  0929   GFRNONAA >60 07/27/2014 0836   GFRAA >60 07/05/2020 0923   GFRAA >60 11/02/2014 0929   GFRAA >60 07/27/2014 0836    No results found for: "SPEP", "UPEP"  Lab Results  Component Value Date   WBC 6.4 02/22/2024   NEUTROABS 3.9 02/22/2024   HGB 12.9 02/22/2024   HCT 39.7 02/22/2024   MCV 90.2 02/22/2024   PLT 210 02/22/2024      Chemistry      Component Value Date/Time   NA 138 02/22/2024 1041   NA 138 09/03/2013 0809   K 4.1 02/22/2024 1041   K 4.3 11/02/2014 0932   CL 103 02/22/2024 1041   CL 105 09/03/2013 0809   CO2 26 02/22/2024 1041   CO2 28 09/03/2013 0809   BUN 31 (H) 02/22/2024 1041   BUN 17 09/03/2013 0809    CREATININE 1.08 (H) 02/22/2024 1041   CREATININE 0.96 11/02/2014 0929      Component Value Date/Time   CALCIUM 9.2 02/22/2024 1041   CALCIUM 9.1 09/03/2013 0809   ALKPHOS 78 02/22/2024 1041   ALKPHOS 87 11/02/2014 0929   AST 22 02/22/2024 1041   ALT 19 02/22/2024 1041   ALT 19 11/02/2014 0929   BILITOT 0.6 02/22/2024 1041       RADIOGRAPHIC STUDIES: I have personally reviewed the radiological images as listed and agreed with the findings in the report. No results found.   ASSESSMENT & PLAN:  Carcinoma of upper-outer quadrant of right breast in female, estrogen receptor positive (HCC) # Right breast cancer stage I ER/PRPos; HER-2 negative. Anastrozole [until spring, 2026].; march 2025-NEG.   # MSK grade 2-secondary to adjuvant AI; continue Osteo Bi-Flex.- stable.OA- ? Right Sever arthritis-[Dr.Poggi]? Replacement.   # ? TIA-on monitor [April 2023] on asprin [off plavix]  stable.  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 Sonia Side.  # Bone density test-February 2023-T-score 0.1-normal limits continue calcium plus vitamin D. Discussed/ and reviewed. Will repeat in 2-3 years.   # Fatigue:reluctant with OSA/CPAP; considering CARE program.   mychart  # DISPOSITION: # Follow up in 6  months- MD; labs- cbc/cmp-  Dr.B    Orders Placed This Encounter  Procedures   CBC with Differential ([facility] Only)    Standing Status:   Future    Expected Date:   08/22/2024    Expiration Date:   02/21/2025   CMP ([facility] only)    Standing Status:   Future    Expected Date:   08/22/2024    Expiration Date:   02/21/2025   All questions were answered. The patient knows to call the clinic with any problems, questions or concerns.      Earna Coder, MD 02/22/2024 12:12 PM

## 2024-02-22 NOTE — Assessment & Plan Note (Addendum)
#   Right breast cancer stage I ER/PRPos; HER-2 negative. Anastrozole [until spring, 2026].; march 2025-NEG.   # MSK grade 2-secondary to adjuvant AI; continue Osteo Bi-Flex.- stable.OA- ? Right Sever arthritis-[Dr.Poggi]? Replacement.   # ? TIA-on monitor [April 2023] on asprin [off plavix]  stable.  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 Sonia Side.  # Bone density test-February 2023-T-score 0.1-normal limits continue calcium plus vitamin D. Discussed/ and reviewed. Will repeat in 2-3 years.   # Fatigue:reluctant with OSA/CPAP; considering CARE program.   mychart  # DISPOSITION: # Follow up in 6  months- MD; labs- cbc/cmp-  Dr.B

## 2024-02-25 ENCOUNTER — Other Ambulatory Visit: Payer: Self-pay

## 2024-02-25 DIAGNOSIS — I10 Essential (primary) hypertension: Secondary | ICD-10-CM

## 2024-02-25 MED ORDER — LISINOPRIL 40 MG PO TABS
40.0000 mg | ORAL_TABLET | Freq: Every day | ORAL | 0 refills | Status: DC
Start: 2024-02-25 — End: 2024-03-03

## 2024-03-05 ENCOUNTER — Ambulatory Visit (INDEPENDENT_AMBULATORY_CARE_PROVIDER_SITE_OTHER)

## 2024-03-05 VITALS — BP 120/80 | HR 79 | Temp 98.9°F | Ht 63.0 in | Wt 245.0 lb

## 2024-03-05 DIAGNOSIS — M25551 Pain in right hip: Secondary | ICD-10-CM

## 2024-03-05 DIAGNOSIS — R5383 Other fatigue: Secondary | ICD-10-CM

## 2024-03-05 DIAGNOSIS — R7303 Prediabetes: Secondary | ICD-10-CM

## 2024-03-05 DIAGNOSIS — K2100 Gastro-esophageal reflux disease with esophagitis, without bleeding: Secondary | ICD-10-CM

## 2024-03-05 DIAGNOSIS — M545 Low back pain, unspecified: Secondary | ICD-10-CM

## 2024-03-05 DIAGNOSIS — E785 Hyperlipidemia, unspecified: Secondary | ICD-10-CM

## 2024-03-05 DIAGNOSIS — G8929 Other chronic pain: Secondary | ICD-10-CM

## 2024-03-05 DIAGNOSIS — R809 Proteinuria, unspecified: Secondary | ICD-10-CM

## 2024-03-05 DIAGNOSIS — E039 Hypothyroidism, unspecified: Secondary | ICD-10-CM

## 2024-03-05 DIAGNOSIS — I10 Essential (primary) hypertension: Secondary | ICD-10-CM

## 2024-03-05 DIAGNOSIS — N182 Chronic kidney disease, stage 2 (mild): Secondary | ICD-10-CM | POA: Insufficient documentation

## 2024-03-05 DIAGNOSIS — S51812D Laceration without foreign body of left forearm, subsequent encounter: Secondary | ICD-10-CM

## 2024-03-05 LAB — COMPREHENSIVE METABOLIC PANEL WITH GFR
ALT: 14 U/L (ref 0–35)
AST: 18 U/L (ref 0–37)
Albumin: 3.9 g/dL (ref 3.5–5.2)
Alkaline Phosphatase: 79 U/L (ref 39–117)
BUN: 28 mg/dL — ABNORMAL HIGH (ref 6–23)
CO2: 31 meq/L (ref 19–32)
Calcium: 9.6 mg/dL (ref 8.4–10.5)
Chloride: 104 meq/L (ref 96–112)
Creatinine, Ser: 0.98 mg/dL (ref 0.40–1.20)
GFR: 57.02 mL/min — ABNORMAL LOW (ref >60.00)
Glucose, Bld: 96 mg/dL (ref 70–99)
Potassium: 4.3 meq/L (ref 3.5–5.1)
Sodium: 143 meq/L (ref 135–145)
Total Bilirubin: 0.5 mg/dL (ref 0.2–1.2)
Total Protein: 6.9 g/dL (ref 6.0–8.3)

## 2024-03-05 LAB — HEMOGLOBIN A1C: Hgb A1c MFr Bld: 5.9 % (ref 4.6–6.5)

## 2024-03-05 MED ORDER — LISINOPRIL 40 MG PO TABS: 40.0000 mg | ORAL_TABLET | Freq: Every day | ORAL | 0 refills | Status: DC

## 2024-03-05 MED ORDER — LANSOPRAZOLE 30 MG PO CPDR: DELAYED_RELEASE_CAPSULE | ORAL | 1 refills | Status: AC

## 2024-03-05 NOTE — Patient Instructions (Addendum)
-   I put a referral to physical therapy to [facility] clinic for right hip and lower back pain.  - We discussed potential side effects related to taking Naproxen for extended period of time.  Continue taking Lansoprazole when you are on Naproxen.

## 2024-03-05 NOTE — Assessment & Plan Note (Signed)
 Healing, significantly improved. Defer to picture from today's documentation.

## 2024-03-05 NOTE — Assessment & Plan Note (Signed)
 We discussed potential side effects related to prolonged use of lansoprazole with the patient.  Symptoms currently controlled on lansoprazole 30 mg daily.  Refill sent today.

## 2024-03-05 NOTE — Assessment & Plan Note (Signed)
 Lab Results  Component Value Date   MICROALBUR 3.7 (H) 08/24/2023   MICROALBUR 1.8 11/23/2014     Likely multifactorial (HTN, prediabetes). On Lisinopril. Check CMP today. Check urine microalbumin after 08/23/2024. Future lab ordered.

## 2024-03-05 NOTE — Assessment & Plan Note (Signed)
 Blood pressure within goal which was reviewed with the patient today.  Recheck CMP today.  Continue lisinopril 40 mg and hydrochlorothiazide 12.5 mg daily.  Refill on lisinopril was sent today.

## 2024-03-05 NOTE — Assessment & Plan Note (Signed)
 Lab Results  Component Value Date   TSH 3.30 12/28/2023  Reviewed above lab.  Stable on current levothyroxine 88 mcg daily, continue.  Recommend repeat TSH in 3 months, lab ordered.

## 2024-03-05 NOTE — Assessment & Plan Note (Signed)
 Referral to physical therapy. She has already seen pain and spine.

## 2024-03-05 NOTE — Assessment & Plan Note (Signed)
 Lab Results  Component Value Date   HGBA1C 5.8 (A) 01/26/2023   Will recheck hemoglobin A1c today.  Lifestyle optimization recommended. On statin therapy.

## 2024-03-05 NOTE — Progress Notes (Signed)
 Established Patient Office Visit   Subjective  Patient ID: Bailey Brooks, female    DOB: [birth year]  Age: 73 y.o. MRN: [id number]  Chief Complaint  Patient presents with   Transitions Of Care  Previous patient of Dr. Birdie Sons presenting to establish care with me. No new concerns today.   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), Diverticulosis, Eczema, Esophagitis, Family history of breast cancer, Family history of melanoma, Family history of prostate cancer, GERD (gastroesophageal reflux disease), H/O stem cell transplant (HCC) (06/2011), Hemorrhoids, History of breast cancer (10/01/2023), History of stress test (05/19/2010), History of transient ischemic attack (TIA) (02/10/2017), Hypercholesterolemia, Hypertension, Hypokalemia (02/10/2017), Hypomagnesemia (03/02/2016), Hypothyroidism, Long term current use of aromatase inhibitor, Morbid obesity (HCC), Muscle cramps (11/13/2012), Ommaya reservoir present (2012), 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  HPI 1) Last OV: 01/16/24 with Bethanie Dicker, NP for left forearm skin lesion. Healing with skin closed. Bruising now otherwise improved. Patient reports since her last visit she is sleeping better. Only waking up once at night to pee otherwise sleeping through the night.   2) H/O vasovagal syncope 3) HTN - Patient reports syncopal episodes are mostly when stimuli like viral gastroenteritis, nausea, vomiting, pain, constipation.   - Amlodipine intolerance due to edema of lower legs. Currently on Lisinopril 40 mg, Hydrochlorothiazide 12.5 mg. She denies chest pain, lower leg  edema, headache.   - CMP from 02/22/24 showed mildly elevated Creatinine at 1.08 from baseline 12/28/23 of 0.96. BG at this visit was 152 mg/dl. Most recent.  - Has seen cardiology, reviewed note from 01/02/24 with Charlsie Quest, NP. Vist note reviewed which states, "history of SVT where EKG today revealed sinus rhythm with a rate of 90 with a left axis deviation with no acute changes."  EKG during her cardiology visit showed sinus rhythm. Documentation from cardiology also states that patient in the past declined BB therapy or implantation of loop recorder.  - Patient's most recent CBC from 02/22/24 was normal.   4) Prediabetes: HbA1c  01/26/2023 was 5.8%. Last urine microalbumin was checked on 08/24/23 which showed mild microalbuminuria of 3.7.   5) Recurrent TIA with no evidence of cardiac source. Currently on Aspirin 81 mg, Atorvastatin 80 mg, Ezetimibe 10 mg. Echocardiogram was previously unremarkable. Last echo 03/08/2022.    6) Hypothyroidism: Last TSH 12/28/23, normal. On Levothyroxine 88 mcg. She reports she takes this first thing in the morning, on it;s own.  7) Acid reflux: Started after developing heart burn after starting chemo therapy for Lymphoma in 2012, Take Lansoprazole 30 mg daily.   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 Last DEXA 01/2022, normal T-score, on calcium and Vitamin D. Recommend repeating in 2-3 years per oncology note.   9) Valvular Paget's-followed by gynecology oncology Dr. Sonia Side.   10) Back pain, right hip pain: Patient has seen orthopedics. Patient prefers not to have surgery. Takes Naproxen 500 mg, twice a day. She also takes extra strength Tylenol every night with Naproxen which helps with symptoms control. She has also seen pain and spine clinic for lower back pain. She has had right hip and back injection which helps with pain  control for 4-6 weeks  with pain and spine clinic.   ROS As per HPI    Objective:     BP 120/80   Pulse 79   Temp 98.9 F (37.2 C) (Axillary)   Ht 5\' 3"  (1.6 m)   Wt 245 lb (111.1 kg)   SpO2 94%   BMI 43.40 kg/m    Physical Exam Constitutional:      Appearance: She is obese.  HENT:     Head: Normocephalic and atraumatic.     Mouth/Throat:     Mouth: Mucous membranes are moist.     Pharynx: No oropharyngeal exudate or posterior oropharyngeal erythema.  Cardiovascular:     Rate and Rhythm: Normal rate.  Pulmonary:     Effort: Pulmonary effort is normal.     Breath sounds: Normal breath sounds.  Abdominal:     General: Abdomen is protuberant. Bowel sounds are normal.  Musculoskeletal:     Cervical back: Neck supple.     Lumbar back: Tenderness present. No lacerations.     Right hip: No bony tenderness. Decreased range of motion. Normal strength.     Left hip: Decreased range of motion.     Right lower leg: No edema.     Left lower leg: No edema.     Comments: Pain on palpation over b/l SI joint, generalized right lower back pain on palpation.   Skin:    General: Skin is warm.  Neurological:     Mental Status: She is alert and oriented to person, place, and time.     No results found for any visits on 03/05/24.  The ASCVD Risk score (Arnett DK, et al., 2019) failed to calculate for the following reasons:   The valid total cholesterol range is 130 to 320 mg/dL    Assessment & Plan:  Prediabetes Assessment & Plan: Lab Results  Component Value Date   HGBA1C 5.8 (A) 01/26/2023   Will recheck hemoglobin A1c today.  Lifestyle optimization recommended. On statin therapy.   Orders: -     Hemoglobin A1c; Future  Hyperlipidemia, unspecified hyperlipidemia type Assessment & Plan: Lab Results  Component Value Date   CHOL 101 04/03/2022   HDL 37 (L) 04/03/2022   LDLCALC 40 04/03/2022   LDLDIRECT 54.4 04/02/2022   TRIG 119 04/03/2022   CHOLHDL 2.7 04/03/2022   Reviewed above  labs, due for repeat lipid check. Recommend checking lipid panel before her next visit with me in 3 months. Continue ezetimibe 10 mg and atorvastatin 80 mg daily.  Orders: -     Lipid panel; Future  Essential hypertension Assessment & Plan: Blood pressure within goal which was reviewed with the patient today.  Recheck CMP today.  Continue lisinopril 40 mg and hydrochlorothiazide 12.5 mg daily.  Refill on lisinopril was sent today.  Orders: -     Lisinopril; Take 1 tablet (40 mg total) by mouth daily.  Dispense: 90 tablet; Refill: 0 -     Comprehensive metabolic panel with GFR  Gastroesophageal reflux disease with esophagitis without hemorrhage Assessment & Plan: We discussed potential side effects related to prolonged use of lansoprazole with the patient.  Symptoms currently controlled on lansoprazole 30 mg daily.  Refill sent today.    Orders: -     Lansoprazole; TAKE 1 CAPSULE BY MOUTH ONCE DAILY  Dispense: 90 capsule; Refill: 1  Hypothyroidism, unspecified type Assessment & Plan: Lab Results  Component Value Date   TSH 3.30 12/28/2023  Reviewed above lab.  Stable  on current levothyroxine 88 mcg daily, continue.  Recommend repeat TSH in 3 months, lab ordered.   Orders: -     TSH; Future  Stage 2 chronic kidney disease Assessment & Plan: Likely multifactorial (HTN, prediabetes). On Lisinopril. Check CMP today.    Chronic right hip pain -     Ambulatory referral to Physical Therapy  Chronic hip pain (3ry area of Pain) (Right) Assessment & Plan: Currently on Naproxen 500 mg twice a day with extra strength tylenol which she is taking daily. I counseled her on r/o GI, renal s/e related to long term use of Naproxen. She does not want to see ortho for the right hip pain and prefers no invasive therapy at this time. However, she is willing to go to physical therapy. Referral made. Continue Naproxen 600 mg twice a day.    Chronic low back pain (1ry area of Pain) (Bilateral) (R>L)  w/o sciatica Assessment & Plan: Referral to physical therapy. She has already seen pain and spine.    Skin tear of left forearm without complication, subsequent encounter Assessment & Plan: Healing, significantly improved. Defer to picture from today's documentation.    Albuminuria Assessment & Plan: Lab Results  Component Value Date   MICROALBUR 3.7 (H) 08/24/2023   MICROALBUR 1.8 11/23/2014     Likely multifactorial (HTN, prediabetes). On Lisinopril. Check CMP today. Check urine microalbumin after 08/23/2024. Future lab ordered.   Orders: -     Microalbumin / creatinine urine ratio; Future    Return in about 3 months (around 06/04/2024) for medication check .   Skip Estimable, MD

## 2024-03-05 NOTE — Assessment & Plan Note (Signed)
 Lab Results  Component Value Date   CHOL 101 04/03/2022   HDL 37 (L) 04/03/2022   LDLCALC 40 04/03/2022   LDLDIRECT 54.4 04/02/2022   TRIG 119 04/03/2022   CHOLHDL 2.7 04/03/2022   Reviewed above labs, due for repeat lipid check. Recommend checking lipid panel before her next visit with me in 3 months. Continue ezetimibe 10 mg and atorvastatin 80 mg daily.

## 2024-03-05 NOTE — Assessment & Plan Note (Signed)
 Likely multifactorial (HTN, prediabetes). On Lisinopril. Check CMP today.

## 2024-03-05 NOTE — Assessment & Plan Note (Signed)
 Currently on Naproxen 500 mg twice a day with extra strength tylenol which she is taking daily. I counseled her on r/o GI, renal s/e related to long term use of Naproxen. She does not want to see ortho for the right hip pain and prefers no invasive therapy at this time. However, she is willing to go to physical therapy. Referral made. Continue Naproxen 600 mg twice a day.

## 2024-03-07 ENCOUNTER — Ambulatory Visit: Payer: Medicare Other | Admitting: Podiatry

## 2024-03-07 ENCOUNTER — Encounter: Payer: Self-pay | Admitting: Podiatry

## 2024-03-07 DIAGNOSIS — M79674 Pain in right toe(s): Secondary | ICD-10-CM

## 2024-03-07 DIAGNOSIS — B351 Tinea unguium: Secondary | ICD-10-CM

## 2024-03-07 DIAGNOSIS — M79675 Pain in left toe(s): Secondary | ICD-10-CM

## 2024-03-13 NOTE — Progress Notes (Signed)
  Subjective:  Patient ID: Bailey Brooks, female    DOB: [birth year],  MRN: [id number]  73 y.o. female presents painful, elongated thickened toenails x 10 which are symptomatic when wearing enclosed shoe gear. This interferes with his/her daily activities.  New problem(s): None   PCP is Bair, Elveria Rising, MD , and last visit was March 05, 2024.  Allergies  Allergen Reactions   Morphine And Codeine Nausea And Vomiting and Nausea Only    Hallucinations   Sulfa Antibiotics Other (See Comments)    Dizzy/Fainting   Adhesive [Tape] Rash and Other (See Comments)    Including Bandaids-paper tape ok to use   Erythromycin Nausea And Vomiting   Oxycodone Nausea And Vomiting    Review of Systems: Negative except as noted in the HPI.   Objective:  Bailey Brooks is a pleasant 73 y.o. female obese in NAD. AAO x 3.  Vascular Examination: Vascular status intact b/l with palpable pedal pulses. CFT immediate b/l. Pedal hair present. No edema. No pain with calf compression b/l. Skin temperature gradient WNL b/l. No varicosities noted. No cyanosis or clubbing noted.  Neurological Examination: Sensation grossly intact b/l with 10 gram monofilament. Vibratory sensation intact b/l.  Dermatological Examination: Pedal skin with normal turgor, texture and tone b/l. No open wounds nor interdigital macerations noted. Toenails right great toe and 2-5 b/l thick, discolored, elongated with subungual debris and pain on dorsal palpation. Incurvated nailplate lateral border left hallux.  Nail border hypertrophy absent. There is tenderness to palpation. Sign(s) of infection: no clinical signs of infection noted on examination today.. No corns, calluses nor porokeratotic lesions noted.   Musculoskeletal Examination: Muscle strength 5/5 to b/l LE.  No pain, crepitus noted b/l. No gross pedal deformities. Patient ambulates independently without assistive aids.   Radiographs: None  Last A1c:      Latest Ref Rng  & Units 03/05/2024   11:45 AM  Hemoglobin A1C  Hemoglobin-A1c 4.6 - 6.5 % 5.9    Assessment:   1. Pain due to onychomycosis of toenails of both feet    Plan:  Consent given for treatment. Patient examined. All patient's and/or POA's questions/concerns addressed on today's visit.Toenails 2-5 bilaterally and R hallux  debrided in length and girth without incident.Continue soft, supportive shoe gear daily. Report any pedal injuries to medical professional. -No invasive procedure(s) performed. Offending nail border debrided and curretaged lateral border left hallux utilizing sterile nail nipper and currette. Border cleansed with alcohol and triple antibiotic ointment applied. No further treatment required by patient/caregiver. Call office if there are any concerns. Return in about 3 months (around 06/06/2024).  Freddie Breech, DPM      [facility] LOCATION: [address] [place] [address]                   Office [phone]   [facility] LOCATION: [address] [place], [place] [address] Office [phone]

## 2024-03-31 ENCOUNTER — Other Ambulatory Visit: Payer: Self-pay | Admitting: Family Medicine

## 2024-03-31 DIAGNOSIS — Z5181 Encounter for therapeutic drug level monitoring: Secondary | ICD-10-CM

## 2024-03-31 DIAGNOSIS — M51379 Other intervertebral disc degeneration, lumbosacral region without mention of lumbar back pain or lower extremity pain: Secondary | ICD-10-CM

## 2024-04-07 NOTE — Telephone Encounter (Signed)
 1. Degeneration of intervertebral disc of lumbosacral region, unspecified whether pain present - naproxen  (NAPROSYN ) 500 MG tablet; TAKE 1 TABLET BY MOUTH TWICE DAILY WITH FOOD AS NEEDED FOR MODERATE PAIN  Dispense: 90 tablet; Refill: 1 - Consider switching to long acting NSAID once a day, discuss this during her f/u appointment in 06/02/2024. - Reviewed CMP from 03/05/24. Recommend BMP every 3 months to f/u on renal function given chronic NSAIDs use.  Component     Latest Ref Rng 03/05/2024  Sodium     135 - 145 mEq/L 143   Potassium     3.5 - 5.1 mEq/L 4.3   Chloride     96 - 112 mEq/L 104   CO2     19 - 32 mEq/L 31   Glucose     70 - 99 mg/dL 96   BUN     6 - 23 mg/dL 28 (H)   Creatinine     0.40 - 1.20 mg/dL 1.61   Calcium      8.4 - 10.5 mg/dL 9.6   GFR     >09.60 mL/min 57.02 (L)   Total Bilirubin     0.2 - 1.2 mg/dL 0.5   Alkaline Phosphatase     39 - 117 U/L 79   AST     0 - 37 U/L 18   ALT     0 - 35 U/L 14   Total Protein     6.0 - 8.3 g/dL 6.9   Albumin     3.5 - 5.2 g/dL 3.9      Jacklin Mascot, MD

## 2024-04-09 ENCOUNTER — Ambulatory Visit: Payer: Medicare Other

## 2024-04-11 DIAGNOSIS — M1612 Unilateral primary osteoarthritis, left hip: Secondary | ICD-10-CM | POA: Insufficient documentation

## 2024-04-11 DIAGNOSIS — M1611 Unilateral primary osteoarthritis, right hip: Secondary | ICD-10-CM | POA: Diagnosis not present

## 2024-04-11 DIAGNOSIS — M25551 Pain in right hip: Secondary | ICD-10-CM | POA: Diagnosis not present

## 2024-05-06 ENCOUNTER — Encounter: Payer: Self-pay | Admitting: Obstetrics and Gynecology

## 2024-05-07 ENCOUNTER — Inpatient Hospital Stay: Payer: Medicare Other | Attending: Internal Medicine | Admitting: Nurse Practitioner

## 2024-05-07 VITALS — BP 156/63 | HR 76 | Temp 98.6°F | Resp 19 | Wt 243.1 lb

## 2024-05-07 DIAGNOSIS — Z923 Personal history of irradiation: Secondary | ICD-10-CM | POA: Insufficient documentation

## 2024-05-07 DIAGNOSIS — Z8544 Personal history of malignant neoplasm of other female genital organs: Secondary | ICD-10-CM | POA: Insufficient documentation

## 2024-05-07 DIAGNOSIS — C50911 Malignant neoplasm of unspecified site of right female breast: Secondary | ICD-10-CM | POA: Insufficient documentation

## 2024-05-07 DIAGNOSIS — Z170 Estrogen receptor positive status [ER+]: Secondary | ICD-10-CM | POA: Insufficient documentation

## 2024-05-07 DIAGNOSIS — Z96612 Presence of left artificial shoulder joint: Secondary | ICD-10-CM | POA: Diagnosis not present

## 2024-05-07 DIAGNOSIS — M25519 Pain in unspecified shoulder: Secondary | ICD-10-CM | POA: Insufficient documentation

## 2024-05-07 DIAGNOSIS — Z8042 Family history of malignant neoplasm of prostate: Secondary | ICD-10-CM | POA: Diagnosis not present

## 2024-05-07 DIAGNOSIS — M25551 Pain in right hip: Secondary | ICD-10-CM | POA: Insufficient documentation

## 2024-05-07 DIAGNOSIS — M549 Dorsalgia, unspecified: Secondary | ICD-10-CM | POA: Insufficient documentation

## 2024-05-07 DIAGNOSIS — Z8051 Family history of malignant neoplasm of kidney: Secondary | ICD-10-CM | POA: Insufficient documentation

## 2024-05-07 DIAGNOSIS — Z803 Family history of malignant neoplasm of breast: Secondary | ICD-10-CM | POA: Insufficient documentation

## 2024-05-07 DIAGNOSIS — Z96611 Presence of right artificial shoulder joint: Secondary | ICD-10-CM | POA: Insufficient documentation

## 2024-05-07 DIAGNOSIS — Z8572 Personal history of non-Hodgkin lymphomas: Secondary | ICD-10-CM | POA: Insufficient documentation

## 2024-05-07 DIAGNOSIS — Z79811 Long term (current) use of aromatase inhibitors: Secondary | ICD-10-CM | POA: Insufficient documentation

## 2024-05-07 DIAGNOSIS — C4499 Other specified malignant neoplasm of skin, unspecified: Secondary | ICD-10-CM | POA: Diagnosis not present

## 2024-05-07 NOTE — Progress Notes (Signed)
 Gynecologic Oncology Interval Visit   Referring Provider: Dr. Raquel Cables  Chief Concern: Recurrent paget's disease of the vulva  Subjective:  Bailey Brooks is a 73 y.o. female, diagnosed with Paget's of the vulva, s/p WLE and vulvar biopsy 09/29/2015, who returns to clinic to re-evaluation.   On 10/17/23 she underwent biopsy with Dr. Randalyn Bushman due to increased vulvar irritation.   1. Vulva, biopsy, Left :       -  SKIN WITH HYPERKERATOSIS. NEGATIVE FOR DYSPLASIA OR MALIGNANCY.   Vulvar irritation remains resolved. She denies complaints. Is planning to undergo a hip replacement on right side d/t worsening back and hip pain.   She continues to follow with Dr Valentine Gasmen for history of breast cancer. Mammograms are uptodate.      Gynecologic Oncology History: Bailey Brooks has a history of localized vulvar Paget's disease.   04/2013- vulvar biopsy revealed Paget's disease               WLE, additional margins resected for positive disease on frozen evaluation.   Part A: VULVA, VAGINAL MARGIN:  - POSITIVE FOR EXTRAMAMMARY PAGET'S DISEASE  Part B: VULVA, POSTERIOR MARGIN:  - POSITIVE FOR EXTRAMAMMARY PAGET'S DISEASE  Part C: VULVA, RIGHT, PARTIAL VULVECTOMY:  - EXTRAMAMMARY PAGET'S DISEASE  Part D: VULVA, NEW VAGINAL MARGIN:  - POSITIVE FOR EXTRAMAMMARY PAGET'S DISEASE  07/28/2015 for routine surveillance an area on the right vulva seemed more suspicious for recurrence. This was biopsied and confirmed recurrent Paget's disease.   She underwent repeat excision 09/29/15 A. VULVA; EXCISION:  - RARE CYTOKERATIN 7 POSITIVE INTRAEPIDERMAL CELLS, INTERPRETED AS RESIDUAL PAGET DISEASE.  - THE SURGICAL MARGINS ARE CLEAR.   B. VULVA, 11:00; BIOPSY:  - RARE CYTOKERATIN 7 POSITIVE INTRAEPIDERMAL CELLS, INTERPRETED AS PAGET DISEASE.   No additional issues since surgery.   She had vulvar biopsy on 05/09/17 which was negative:   DIAGNOSIS:  A. VULVA; BIOPSY:  - SKIN WITH FOCAL KERATOSIS AND  HYPERGRANULOSIS - NEGATIVE FOR DYSPLASIA AND MALIGNANCY  Of note, she has had a h/o recurrent vulvar candidiasis. Prior to her vulvar surgery 09/2015 she received 5 weekly doses of oral diflucan  for vulvar candidiasis. Postop she was treated for yeast infection again in 12/17 and then took one Diflucan  a week for six months with good results.   Seen in gyn-onc clinic on 01/16/18 by Dr. Randalyn Bushman. NED at that time.   She has swelling of her lower extremities which was evaluated by her PCP and thought to be related to amlodipine . She started lasix which has improved her symptoms. She was seen by Dr. Valentine Gasmen for history of DLBCL without evidence of recurrence and was released to care of her PCP.   She saw Dr. Randalyn Bushman on 01/29/2019 with symptoms concerning for candidiasis at that time but no evidence of recurrent disease.   Her blood pressure medications were recently changes due to swelling secondary to amlodipine . No on lisinopril .   Previous biopsies were consistent with recurrent Paget's.   DIAGNOSIS:  A. VULVA, LEFT AT 3:00; PUNCH BIOPSY:  - BENIGN VULVAR TISSUE.  - NEGATIVE FOR DYSPLASIA AND MALIGNANCY.   DIAGNOSIS:  A. VULVA, RIGHT AT 7:00; PUNCH BIOPSY:  - EXTRAMAMMARY PAGET'S DISEASE OF THE VULVA.  - NEGATIVE FOR DYSPLASIA AND MALIGNANCY.   In the interim she has completed surgery and radiation for stage I right breast cancer, ER/PR positive HER-2/neu negative. She completed radiation on 12/29/2019. Anastrozole  was recommended and she plans to start in approximately late February.   At  last visit we had recommended start Aldara  but due to recent diagnosis of breast cancer at that time, patient requested to wait to start treatment and returns today to discuss options for management and consider starting Aldara . She is concerned about reaching site to be treated and tolerability. Her daughter-in-law is going to help her with application. Previously she had expressed desire to avoid  surgery/Mohs.   01/16/20 started Imiquimod   completed 5 weeks of treatment then held 02/18/2020 due to ulceration, erythema, and pain. Restarted on 03/17/2020. We recommended to continue imiquimod  for 16 weeks and add clobetasol , 0.05% cream to vulva on alternating days that she does not use imiquimod  to help with inflammation and reduce risk of recurrent ulcer/skin breakdown. Completed treatment on 08/14/2020.   08/14/20 She completed imiquimod  treatment. At last appointment, post imiquimod , she underwent biopsy x 2 which were negative for Paget's. We discussed maintenance imiquimod  vs monitoring. She elected close surveillance.   08/25/20 DIAGNOSIS:  A. VULVA, RIGHT; BIOPSY:  - BENIGN SQUAMOUS MUCOSA WITH MILD CHRONIC INFLAMMATION.  - NEGATIVE FOR DYSPLASIA AND MALIGNANCY.  - NEGATIVE FOR ACTIVE INFLAMMATION AND FUNGAL ELEMENTS.   B. VULVA, LEFT; BIOPSY:  - BENIGN SQUAMOUS MUCOSA WITH MILD CHRONIC INFLAMMATION.  - NEGATIVE FOR DYSPLASIA AND MALIGNANCY.  - NEGATIVE FOR ACTIVE INFLAMMATION AND FUNGAL ELEMENTS.   Comment:  The history of extramammary Paget's disease is noted. Immunohistochemical stain for CK7 (blocks A1 and B1) is negative.   Patient had vasovagal episode secondary to straining to have BM in September 2021 and was seen in ER. Concerned lidocaine  for biopsy could have contributed. She was seen in ER and had negative head ct. Left frontal ventriculoperitoneal shunt was stable on imaging.   10/17/23 she underwent biopsy due to increased vulvar irritation.   1. Vulva, biopsy, Left : SKIN WITH HYPERKERATOSIS. NEGATIVE FOR DYSPLASIA OR MALIGNANCY.   Problem List: Patient Active Problem List   Diagnosis Date Noted   Stage 2 chronic kidney disease 03/05/2024   Skin tear of left forearm without complication 01/09/2024   Osteoarthritis of hips (Bilateral) 12/13/2023   Morbid obesity with body mass index (BMI) of 40.0 to 44.9 in adult [facility]) 12/13/2023   Spondylosis without  myelopathy or radiculopathy, lumbosacral region 12/04/2023   Decreased range of motion of hips (Bilateral) 12/04/2023   Abnormal MRI, lumbar spine (11/08/2023) 11/15/2023   DDD (degenerative disc disease), lumbosacral 11/15/2023   History of Allergy to iodine 11/15/2023    Class: History of   Anxiety about treatment 10/24/2023   Chronic pain syndrome 10/01/2023   Pharmacologic therapy 10/01/2023   Problems influencing health status 10/01/2023   Chronic groin pain  (2ry area of Pain) (Right) 10/01/2023   Spinal stenosis, lumbar region, with neurogenic claudication 10/01/2023   Chronic shoulder pain (5th area of Pain) (Bilateral) 10/01/2023   Albuminuria 08/24/2023   Nail dystrophy 08/24/2023   Rotator cuff arthropathy, left 11/17/2022   Status post reverse arthroplasty of shoulder, left 11/14/2022   Eye abnormality 10/30/2022   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 04/24/2022   Lumbar spondylosis 04/24/2022   TIA (transient ischemic attack) 04/02/2022   Rotator cuff tear 12/09/2021   Syncope 09/03/2020   Low serum vitamin B12 05/28/2020   Fatigue 03/24/2020   Genetic testing 03/23/2020   Carcinoma of upper-outer quadrant of right breast in female, estrogen receptor positive (HCC) 10/28/2019   Pain due to onychomycosis of toenails of both feet 06/09/2019   Prediabetes 12/20/2018   Macular degeneration  06/20/2018   GERD (gastroesophageal reflux disease) 06/20/2018   Hypersomnia 06/20/2018   Chronic knee pain (4th area of Pain) (Bilateral) (R>L) 06/20/2018   History of lymphoma 06/11/2017   Vulvar lesion 05/16/2017   Chronic low back pain (1ry area of Pain) (Bilateral) (R>L) w/o sciatica 04/03/2017   Chronic hip pain (3ry area of Pain) (Right) 08/03/2016   Essential hypertension 07/06/2016   Morbid obesity due to excess calories (HCC) 03/02/2016   Adjustment disorder with mixed anxiety and depressed mood 05/25/2015   Paget disease, extra  mammary 05/19/2014   Eczema 02/27/2013   Hypothyroidism 02/20/2012   HLD (hyperlipidemia) 02/20/2012    Past Medical History: Past Medical History:  Diagnosis Date   Adult pulmonary Langerhans cell histiocytosis (HCC)    Eosinophilic Granuloma of the Lung)   Allergic rhinitis    Antineoplastic chemotherapy induced anemia    Arthritis    Breast cancer, right (HCC) 09/17/2019   a.) Bx (+) stage 1 IMC (G1, ER/PR +, Her2/neu -); pT1a N0 M0; Tx'd with adjuvant XRT + endocrine therapy   Bruising 12/14/2017   Complication of anesthesia    pt was awake during intubation for colonoscopy   Diastolic dysfunction    a.) TTE 11/14/2016: EF 60-65%, no rwma, mild LVH, triv MR, mildly dil LA, nl RVSF, G1DD; b. 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   History of transient ischemic attack (TIA) 02/10/2017   Hypercholesterolemia    Hypertension    Hypokalemia 02/10/2017   Hypomagnesemia 03/02/2016   Hypothyroidism    Long term current use of aromatase inhibitor    Morbid obesity (HCC)    Muscle cramps 11/13/2012   Ommaya reservoir present 2012   a.) placed for intrathecal chemotherapy; remains in place as of 11/2022   Paget's disease of vulva (HCC) 05/13/2013   a.) Bx (+) IPN of vulva consistent with Pagets;  resected; b.) s/p re-excision 09/29/2015   Pre-diabetes    Syncope    TIA (transient ischemic attack)    a.) s/p TIA 01/02/2011; b.) s/p TIA 02/10/2017; c.) s/p TIA 04/02/2022; d.) 02/2017 event monitor: No significant arrhythmias.   Tubular adenoma of colon     Past Surgical History: Past Surgical History:  Procedure Laterality Date   ABDOMINAL HYSTERECTOMY  1985   Hysterectomy-partial   BREAST BIOPSY Right 2002   Neg - AT [facility]   BREAST BIOPSY Right 2020   bx done at [place]?, IDC and DCIS   BREAST LUMPECTOMY Right 10/16/2019   IDC and DCIS, negative LN   BURR HOLE W/ PLACEMENT OMMAYA RESERVOIR     BURR HOLE W/ PLACEMENT OMMAYA RESERVOIR  2012   COLONOSCOPY  04/2013   ESOPHAGOGASTRODUODENOSCOPY  04/2013   INJECTION KNEE  07/02/2018   INSERTION CENTRAL VENOUS ACCESS DEVICE W/ SUBCUTANEOUS PORT  2011   Port a Cath: Right chest Double Lumen, 04-Nov-2010   LIMBAL STEM CELL TRANSPLANT  2012   LIVER BIOPSY     stage 4B large Bcell lymphoma   PARTIAL MASTECTOMY WITH NEEDLE LOCALIZATION AND AXILLARY SENTINEL LYMPH NODE BX Right 10/16/2019   Procedure: PARTIAL MASTECTOMY WITH NEEDLE LOCALIZATION AND AXILLARY SENTINEL LYMPH NODE BX;  Surgeon: Conrado Delay, DO;  Location: [facility] ORS;  Service: General;  Laterality: Right;   REVERSE SHOULDER ARTHROPLASTY Left 11/14/2022   Procedure: REVERSE SHOULDER ARTHROPLASTY WITH BICEPS TENODESIS.;  Surgeon: Elner Hahn, MD;  Location: [facility];  Service: Orthopedics;  Laterality: Left;   ROTATOR CUFF REPAIR Right 2016   SHOULDER ARTHROSCOPY Right 06/22/2015   Procedure: ARTHROSCOPY SHOULDER, parital repair of rotator cuff, biceps tenodesis, decompression and debridement;  Surgeon: Elner Hahn, MD;  Location: [facility] OR[facility];  Service: Orthopedics;  Laterality: Right;   TONSILLECTOMY  1954   VULVECTOMY     VULVECTOMY PARTIAL N/A 09/29/2015   Procedure: VULVECTOMY PARTIAL;  Surgeon: Nobie Batch, MD;  Location: [facility];  Service: Gynecology;  Laterality:  N/A;    Past Gynecologic History:  S/p hysterectomy  OB History:  OB History  No obstetric history on file.    Family History: Family History  Problem Relation Age of Onset   Diabetes Mother        died @ 48 of MI.   Hypertension Mother    Heart attack Mother    Melanoma Father 24       died of complications r/t melanoma w/ lung mets.   Kidney disease Sister        Kidney removed    Breast cancer Sister 58   Prostate cancer Brother        Prostate - dx in 4's   Heart disease Brother 54       reported MI @ age 48, ? treated w/ TPA->no recurrent CAD, now in 84's.   Hearing loss Maternal Aunt    Cancer Maternal Aunt        pancreatic vs colon cancer   Hearing loss Maternal Grandfather    Other Maternal Grandmother        flu pandemic   Kidney cancer Niece 49       partial nephrectomy    Social History: Social History   Socioeconomic History   Marital status: Married    Spouse name: Not on file   Number of children: 2   Years of education: Not on file   Highest education level: Bachelor's degree (e.g., BA, AB, BS)  Occupational History   Occupation: Pharmacologist Records, [employer]    Employer: [employer]  Tobacco Use   Smoking status: Never   Smokeless tobacco: Never  Vaping Use   Vaping status: Never Used  Substance and Sexual Activity   Alcohol use: No    Alcohol/week: 0.0 standard drinks of alcohol   Drug use: No   Sexual activity: Not Currently    Partners: Male    Birth control/protection: Post-menopausal  Other Topics Concern   Not on file  Social History Narrative   Lives in [place] with husband, has 2 grown sons.  No pets.  Retired from [employer].  Activity limited by chronic back pain-sedentary.   Right-handed   Caffeine: occasional caffeine free/diet soda or hot tea   Social Drivers of Health   Financial Resource Strain: Low Risk  (12/27/2023)  Overall Financial Resource Strain (CARDIA)    Difficulty of Paying Living  Expenses: Not very hard  Food Insecurity: No Food Insecurity (12/27/2023)   Hunger Vital Sign    Worried About Running Out of Food in the Last Year: Never true    Ran Out of Food in the Last Year: Never true  Transportation Needs: No Transportation Needs (12/27/2023)   PRAPARE - Administrator, Civil Service (Medical): No    Lack of Transportation (Non-Medical): No  Physical Activity: Inactive (12/27/2023)   Exercise Vital Sign    Days of Exercise per Week: 0 days    Minutes of Exercise per Session: 10 min  Stress: No Stress Concern Present (12/27/2023)   [employer] of Occupational Health - Occupational Stress Questionnaire    Feeling of Stress : Not at all  Social Connections: Socially Integrated (12/27/2023)   Social Connection and Isolation Panel [NHANES]    Frequency of Communication with Friends and Family: More than three times a week    Frequency of Social Gatherings with Friends and Family: Once a week    Attends Religious Services: More than 4 times per year    Active Member of [employer] or Organizations: Yes    Attends Banker Meetings: More than 4 times per year    Marital Status: Married  Catering manager Violence: Not At Risk (04/26/2023)   Humiliation, Afraid, Rape, and Kick questionnaire    Fear of Current or Ex-Partner: No    Emotionally Abused: No    Physically Abused: No    Sexually Abused: No    Allergies: Allergies  Allergen Reactions   Morphine And Codeine Nausea And Vomiting and Nausea Only    Hallucinations   Sulfa Antibiotics Other (See Comments)    Dizzy/Fainting   Adhesive [Tape] Rash and Other (See Comments)    Including Bandaids-paper tape ok to use   Erythromycin Nausea And Vomiting   Oxycodone  Nausea And Vomiting    Current Medications: Current Outpatient Medications  Medication Sig Dispense Refill   acetaminophen  (TYLENOL ) 500 MG tablet Take 1,000 mg by mouth in the morning and at bedtime.     anastrozole  (ARIMIDEX )  1 MG tablet TAKE 1 TABLET BY MOUTH DAILY 90 tablet 1   aspirin  EC 81 MG tablet Take 81 mg by mouth daily. Swallow whole.     atorvastatin  (LIPITOR) 80 MG tablet TAKE 1 TABLET BY MOUTH DAILY 90 tablet 3   cholecalciferol  (VITAMIN D3) 25 MCG (1000 UT) tablet Take 1,000 Units by mouth daily.     ezetimibe  (ZETIA ) 10 MG tablet TAKE 1 TABLET BY MOUTH DAILY 90 tablet 3   hydrochlorothiazide  (HYDRODIURIL ) 12.5 MG tablet TAKE ONE TABLET BY MOUTH EVERY DAY 90 tablet 3   ketoconazole  (NIZORAL ) 2 % cream Apply twice daily as directed. (Patient taking differently: 1 Application daily as needed for irritation. Apply twice daily as directed.) 60 g 2   lansoprazole  (PREVACID ) 30 MG capsule TAKE 1 CAPSULE BY MOUTH ONCE DAILY 90 capsule 1   levothyroxine  (SYNTHROID ) 88 MCG tablet TAKE 1 TABLET BY MOUTH ONCE DAILY. TAKE ON EMPTY STOMACH WITH A GLASS OF WATER AT LEAST 30-60 MINUTES BEFORE BREAKFAST 90 tablet 3   lisinopril  (ZESTRIL ) 40 MG tablet Take 1 tablet (40 mg total) by mouth daily. 90 tablet 0   mometasone  (ELOCON ) 0.1 % cream APPLY TO ITCHY SPOTS ON BODY 1-2 TIMES DAILY UNTIL IMPROVED; AVOID FACE, GROIN,AND UNDERARMS 45 g 1   Multiple Vitamin (MULTIVITAMIN) tablet Take  1 tablet by mouth daily.     naproxen  (NAPROSYN ) 500 MG tablet TAKE 1 TABLET BY MOUTH TWICE DAILY WITH FOOD AS NEEDED FOR MODERATE PAIN 90 tablet 1   Specialty Vitamins Products (ICAPS LUTEIN  & ZEAXANTHIN PO) Take 1 tablet by mouth every morning. Zeaxanthin 4mg /Lutein  10mg      tacrolimus  (PROTOPIC ) 0.1 % ointment Apply 1-2 times daily as needed for itch to affected areas. (Patient taking differently: 1 Application as needed. Apply 1-2 times daily as needed for itch to affected areas.) 60 g 2   No current facility-administered medications for this visit.   Review of Systems General:  no complaints Skin: no complaints Eyes: no complaints HEENT: no complaints Breasts: no complaints Pulmonary: no complaints Cardiac: no  complaints Gastrointestinal: no complaints Genitourinary/Sexual: no complaints Ob/Gyn: no complaints Musculoskeletal: chronic back and right hip pain Hematology: no complaints Neurologic/Psych: no complaints   Objective:  Physical Examination:  BP (!) 156/63   Pulse 76   Temp 98.6 F (37 C)   Resp 19   Wt 243 lb 1.6 oz (110.3 kg)   SpO2 96%   BMI 43.06 kg/m    Body mass index is 43.06 kg/m.   GENERAL: Patient is a well appearing female in no acute distress HEENT:  Atraumatic and normocephalic.  LUNGS: Normal respiratory effort ABDOMEN:  Soft, nontender. Nondistended.  EXTREMITIES:  Mild symmetrical peripheral edema.   NEURO:  Nonfocal. Well oriented.  Appropriate affect.  Pelvic: exam chaperoned by CMA EGBUS: Multiple vulvar scars and post surgical changes to anatomy. Well healed. Scattered areas of hypopigmentation and diffuse erythema of left labia and vaginal introitus. Stable appearing.  Right vulva increased erythema and tenderness, sharply demarcated.  Vagina: no discharge, lesions.  Cervix: surgically absent BME: limited d/t habitus. No apparent masses RV: Not indicated  Today's exam:          Previous exam:          Assessment:  Bailey Brooks is a 74 y.o. female diagnosed with recurrent Paget's disease of the vulva status post wide local excision with positive margins and subsequent recurrence in 07/2015.  Repeat wide local excision consistent with recurrent Paget's disease of the vulva status post wide local excision with negative margins.  Biopsy in 2018 was negative.  Biopsy in 2020 at 7:00 consistent with recurrent Paget's.  She deffered prior treatment due to breast cancer diagnosis and need for therapy. She has now completed treatment and she is ready to focus on management of Paget's disease.  01/16/20 started Imiquimod   completed 5 weeks of treatment then held 02/18/2020 due to ulceration, erythema, and pain. Restarted on 03/17/2020 and completed   08/14/2020. Negative biopsies 9/21. Opted for surveillance. Previous vulvar biopsies for worsening erythema were negative and vulvar symptoms have resolved. Today, she is clinically asymptomatic. Exam is stable on left. New erythema and sharply demarcated border on right vulva however, asymptomatic.    Right breast cancer- stage Ia ER positive right breast cancer - S/p lumpectomy and radiation, now on aromatase inhibitor. She is followed by medical oncology/Dr. Valentine Gasmen and Dr. Jacalyn Martin.   Low back pain and hip pain. MRI with pain management. Awaiting right hip replacement  Shoulder pain- s/p right arthroplasty and left shoulder replacement, pain improved     Plan:   Problem List Items Addressed This Visit   None Visit Diagnoses       Extramammary Paget disease    -  Primary      We discussed biopsy of right  vulva vs observation. She is quite limited with exam d/t pain from her right hip. She is awaiting hip replacement. Previously biopsy of left was negative. For now, I think it's reasonable to hold off on biopsy and let her go through her hip replacement. If worsening at next visit, consider biopsy. If positive for paget's we can consider EUA, Mohs surgery vs simple vulvectomy.   Right hip pain- awaiting replacement  She continues breast cancer surveillance with Dr Valentine Gasmen  We will plan to see her back in 6 months for follow up or sooner if she develops worsening symptoms. Patient does request female gyn providers only.    The patient's diagnosis, an outline of the further diagnostic and laboratory studies which will be required, the recommendation for surgery, and alternatives were discussed with her and her accompanying family members.  All questions were answered to their satisfaction.   Kenney Peacemaker, DNP, AGNP-C, AOCNP [facility] [phone] (clinic)

## 2024-05-15 ENCOUNTER — Other Ambulatory Visit: Payer: Self-pay | Admitting: Surgery

## 2024-05-20 DIAGNOSIS — M1611 Unilateral primary osteoarthritis, right hip: Secondary | ICD-10-CM | POA: Diagnosis not present

## 2024-05-20 DIAGNOSIS — M25551 Pain in right hip: Secondary | ICD-10-CM | POA: Diagnosis not present

## 2024-05-20 DIAGNOSIS — G8929 Other chronic pain: Secondary | ICD-10-CM | POA: Diagnosis not present

## 2024-05-21 ENCOUNTER — Other Ambulatory Visit: Payer: Self-pay

## 2024-05-21 ENCOUNTER — Encounter
Admission: RE | Admit: 2024-05-21 | Discharge: 2024-05-21 | Disposition: A | Discharge status: Home / Self Care | Source: Ambulatory Visit | Attending: Surgery | Admitting: Surgery

## 2024-05-21 VITALS — BP 149/56 | HR 77 | Temp 98.8°F | Resp 20 | Ht 63.0 in | Wt 242.2 lb

## 2024-05-21 DIAGNOSIS — R8281 Pyuria: Secondary | ICD-10-CM | POA: Insufficient documentation

## 2024-05-21 DIAGNOSIS — Z01818 Encounter for other preprocedural examination: Secondary | ICD-10-CM

## 2024-05-21 DIAGNOSIS — R8290 Unspecified abnormal findings in urine: Secondary | ICD-10-CM | POA: Diagnosis not present

## 2024-05-21 DIAGNOSIS — R937 Abnormal findings on diagnostic imaging of other parts of musculoskeletal system: Secondary | ICD-10-CM

## 2024-05-21 DIAGNOSIS — Z01812 Encounter for preprocedural laboratory examination: Secondary | ICD-10-CM | POA: Diagnosis not present

## 2024-05-21 DIAGNOSIS — M1611 Unilateral primary osteoarthritis, right hip: Secondary | ICD-10-CM

## 2024-05-21 HISTORY — DX: Cerebral infarction, unspecified: I63.9

## 2024-05-21 LAB — COMPREHENSIVE METABOLIC PANEL WITH GFR
ALT: 17 U/L (ref 0–44)
AST: 23 U/L (ref 15–41)
Albumin: 4 g/dL (ref 3.5–5.0)
Alkaline Phosphatase: 76 U/L (ref 38–126)
Anion gap: 7 (ref 5–15)
BUN: 36 mg/dL — ABNORMAL HIGH (ref 8–23)
CO2: 28 mmol/L (ref 22–32)
Calcium: 9.6 mg/dL (ref 8.9–10.3)
Chloride: 105 mmol/L (ref 98–111)
Creatinine, Ser: 0.9 mg/dL (ref 0.44–1.00)
GFR, Estimated: >60 mL/min (ref >60)
Glucose, Bld: 102 mg/dL — ABNORMAL HIGH (ref 70–99)
Potassium: 4.2 mmol/L (ref 3.5–5.1)
Sodium: 140 mmol/L (ref 135–145)
Total Bilirubin: 0.7 mg/dL (ref 0.0–1.2)
Total Protein: 7.2 g/dL (ref 6.5–8.1)

## 2024-05-21 LAB — URINALYSIS, ROUTINE W REFLEX MICROSCOPIC
Bacteria, UA: NONE SEEN
Bilirubin Urine: NEGATIVE
Glucose, UA: NEGATIVE mg/dL
Hgb urine dipstick: NEGATIVE
Ketones, ur: NEGATIVE mg/dL
Nitrite: NEGATIVE
Protein, ur: NEGATIVE mg/dL
Specific Gravity, Urine: 1.03 (ref 1.005–1.030)
pH: 5 (ref 5.0–8.0)

## 2024-05-21 LAB — CBC WITH DIFFERENTIAL/PLATELET
Abs Immature Granulocytes: 0.03 10*3/uL (ref 0.00–0.07)
Basophils Absolute: 0 10*3/uL (ref 0.0–0.1)
Basophils Relative: 0 %
Eosinophils Absolute: 0.1 10*3/uL (ref 0.0–0.5)
Eosinophils Relative: 1 %
HCT: 40.4 % (ref 36.0–46.0)
Hemoglobin: 12.9 g/dL (ref 12.0–15.0)
Immature Granulocytes: 1 %
Lymphocytes Relative: 34 %
Lymphs Abs: 2.2 10*3/uL (ref 0.7–4.0)
MCH: 28.1 pg (ref 26.0–34.0)
MCHC: 31.9 g/dL (ref 30.0–36.0)
MCV: 88 fL (ref 80.0–100.0)
Monocytes Absolute: 0.5 10*3/uL (ref 0.1–1.0)
Monocytes Relative: 8 %
Neutro Abs: 3.6 10*3/uL (ref 1.7–7.7)
Neutrophils Relative %: 56 %
Platelets: 229 10*3/uL (ref 150–400)
RBC: 4.59 MIL/uL (ref 3.87–5.11)
RDW: 14 % (ref 11.5–15.5)
WBC: 6.5 10*3/uL (ref 4.0–10.5)
nRBC: 0 % (ref 0.0–0.2)

## 2024-05-21 LAB — SURGICAL PCR SCREEN
MRSA, PCR: NEGATIVE
Staphylococcus aureus: POSITIVE — AB

## 2024-05-21 LAB — TYPE AND SCREEN

## 2024-05-21 NOTE — Patient Instructions (Addendum)
 Your procedure is scheduled on: Thursday 05/29/24  Report to the Registration Desk on the 1st floor of the [facility]. To find out your arrival time, please call [phone] between 1PM - 3PM on: Wednesday 05/28/24  If your arrival time is 6:00 am, do not arrive before that time as the [facility] entrance doors do not open until 6:00 am.  REMEMBER: Instructions that are not followed completely may result in serious medical risk, up to and including death; or upon the discretion of your surgeon and anesthesiologist your surgery may need to be rescheduled.  Do not eat food after midnight the night before surgery.  No gum chewing or hard candies.  You may however, drink CLEAR liquids up to 2 hours before you are scheduled to arrive for your surgery. Do not drink anything within 2 hours of your scheduled arrival time.  Clear liquids include: - water  - apple juice without pulp - gatorade (not RED colors) - black coffee or tea (Do NOT add milk or creamers to the coffee or tea) Do NOT drink anything that is not on this list.   In addition, your doctor has ordered for you to drink the provided:  Ensure Pre-Surgery Clear Carbohydrate Drink  Drinking this carbohydrate drink up to two hours before surgery helps to reduce insulin resistance and improve patient outcomes. Please complete drinking 2 hours before scheduled arrival time.  One week prior to surgery: Stop Anti-inflammatories (NSAIDS) such as Advil , Aleve , Ibuprofen , Motrin , Naproxen , Naprosyn  and Aspirin  based products such as Excedrin, Goody's Powder, BC Powder. Stop ANY OVER THE COUNTER supplements until after surgery.  You may however, continue to take Tylenol  if needed for pain up until the day of surgery.   Continue taking all of your other prescription medications up until the day of surgery.  ON THE DAY OF SURGERY ONLY TAKE THESE MEDICATIONS WITH SIPS OF WATER:  anastrozole  (ARIMIDEX ) 1 MG  atorvastatin  (LIPITOR) 80 MG   lansoprazole  (PREVACID ) 30 MG  levothyroxine  (SYNTHROID ) 88 MCG    No Alcohol for 24 hours before or after surgery.  No Smoking including e-cigarettes for 24 hours before surgery.  No chewable tobacco products for at least 6 hours before surgery.  No nicotine patches on the day of surgery.  Do not use any recreational drugs for at least a week (preferably 2 weeks) before your surgery.  Please be advised that the combination of cocaine and anesthesia may have negative outcomes, up to and including death. If you test positive for cocaine, your surgery will be cancelled.  On the morning of surgery brush your teeth with toothpaste and water, you may rinse your mouth with mouthwash if you wish. Do not swallow any toothpaste or mouthwash.  Use CHG Soap or wipes as directed on instruction sheet.  Do not wear jewelry, make-up, hairpins, clips or nail polish.  For welded (permanent) jewelry: bracelets, anklets, waist bands, etc.  Please have this removed prior to surgery.  If it is not removed, there is a chance that hospital personnel will need to cut it off on the day of surgery.  Do not wear lotions, powders, or perfumes.   Do not shave body hair from the neck down 48 hours before surgery.  Contact lenses, hearing aids and dentures may not be worn into surgery.  Do not bring valuables to the hospital. [facility] is not responsible for any missing/lost belongings or valuables.   Bring your C-PAP to the hospital in case you may  have to spend the night.   Notify your doctor if there is any change in your medical condition (cold, fever, infection).  Wear comfortable clothing (specific to your surgery type) to the hospital.  After surgery, you can help prevent lung complications by doing breathing exercises.  Take deep breaths and cough every 1-2 hours. Your doctor may order a device called an Incentive Spirometer to help you take deep breaths. When coughing or sneezing, hold a  pillow firmly against your incision with both hands. This is called "splinting." Doing this helps protect your incision. It also decreases belly discomfort.  If you are being admitted to the hospital overnight, leave your suitcase in the car. After surgery it may be brought to your room.  In case of increased patient census, it may be necessary for you, the patient, to continue your postoperative care in the Same Day Surgery department.  If you are being discharged the day of surgery, you will not be allowed to drive home. You will need a responsible individual to drive you home and stay with you for 24 hours after surgery.   If you are taking public transportation, you will need to have a responsible individual with you.  Please call the Pre-admissions Testing Dept. at [phone] if you have any questions about these instructions.  Surgery Visitation Policy:  Patients having surgery or a procedure may have two visitors.  Children under the age of 33 must have an adult with them who is not the patient.  Inpatient Visitation:    Visiting hours are 7 a.m. to 8 p.m. Up to four visitors are allowed at one time in a patient room. The visitors may rotate out with other people during the day.  One visitor age 22 or older may stay with the patient overnight and must be in the room by 8 p.m.    Pre-operative 5 CHG Bath Instructions   You can play a key role in reducing the risk of infection after surgery. Your skin needs to be as free of germs as possible. You can reduce the number of germs on your skin by washing with CHG (chlorhexidine  gluconate) soap before surgery. CHG is an antiseptic soap that kills germs and continues to kill germs even after washing.   DO NOT use if you have an allergy to chlorhexidine /CHG or antibacterial soaps. If your skin becomes reddened or irritated, stop using the CHG and notify one of our RNs at [phone].   Please shower with the CHG soap starting 4  days before surgery using the following schedule:     Please keep in mind the following:  DO NOT shave, including legs and underarms, starting the day of your first shower.   You may shave your face at any point before/day of surgery.  Place clean sheets on your bed the day you start using CHG soap. Use a clean washcloth (not used since being washed) for each shower. DO NOT sleep with pets once you start using the CHG.   CHG Shower Instructions:  If you choose to wash your hair and private area, wash first with your normal shampoo/soap.  After you use shampoo/soap, rinse your hair and body thoroughly to remove shampoo/soap residue.  Turn the water OFF and apply about 3 tablespoons (45 ml) of CHG soap to a CLEAN washcloth.  Apply CHG soap ONLY FROM YOUR NECK DOWN TO YOUR TOES (washing for 3-5 minutes)  DO NOT use CHG soap on face, private areas, open wounds,  or sores.  Pay special attention to the area where your surgery is being performed.  If you are having back surgery, having someone wash your back for you may be helpful. Wait 2 minutes after CHG soap is applied, then you may rinse off the CHG soap.  Pat dry with a clean towel  Put on clean clothes/pajamas   If you choose to wear lotion, please use ONLY the CHG-compatible lotions on the back of this paper.     Additional instructions for the day of surgery: DO NOT APPLY any lotions, deodorants, cologne, or perfumes.   Put on clean/comfortable clothes.  Brush your teeth.  Ask your nurse before applying any prescription medications to the skin.      CHG Compatible Lotions   Aveeno Moisturizing lotion  Cetaphil Moisturizing Cream  Cetaphil Moisturizing Lotion  Clairol Herbal Essence Moisturizing Lotion, Dry Skin  Clairol Herbal Essence Moisturizing Lotion, Extra Dry Skin  Clairol Herbal Essence Moisturizing Lotion, Normal Skin  Curel Age Defying Therapeutic Moisturizing Lotion with Alpha Hydroxy  Curel Extreme Care Body  Lotion  Curel Soothing Hands Moisturizing Hand Lotion  Curel Therapeutic Moisturizing Cream, Fragrance-Free  Curel Therapeutic Moisturizing Lotion, Fragrance-Free  Curel Therapeutic Moisturizing Lotion, Original Formula  Eucerin Daily Replenishing Lotion  Eucerin Dry Skin Therapy Plus Alpha Hydroxy Crme  Eucerin Dry Skin Therapy Plus Alpha Hydroxy Lotion  Eucerin Original Crme  Eucerin Original Lotion  Eucerin Plus Crme Eucerin Plus Lotion  Eucerin TriLipid Replenishing Lotion  Keri Anti-Bacterial Hand Lotion  Keri Deep Conditioning Original Lotion Dry Skin Formula Softly Scented  Keri Deep Conditioning Original Lotion, Fragrance Free Sensitive Skin Formula  Keri Lotion Fast Absorbing Fragrance Free Sensitive Skin Formula  Keri Lotion Fast Absorbing Softly Scented Dry Skin Formula  Keri Original Lotion  Keri Skin Renewal Lotion Keri Silky Smooth Lotion  Keri Silky Smooth Sensitive Skin Lotion  Nivea Body Creamy Conditioning Oil  Nivea Body Extra Enriched Lotion  Nivea Body Original Lotion  Nivea Body Sheer Moisturizing Lotion Nivea Crme  Nivea Skin Firming Lotion  NutraDerm 30 Skin Lotion  NutraDerm Skin Lotion  NutraDerm Therapeutic Skin Cream  NutraDerm Therapeutic Skin Lotion  ProShield Protective Hand Cream  Provon moisturizing lotion   How to Use an Incentive Spirometer  An incentive spirometer is a tool that measures how well you are filling your lungs with each breath. Learning to take long, deep breaths using this tool can help you keep your lungs clear and active. This may help to reverse or lessen your chance of developing breathing (pulmonary) problems, especially infection. You may be asked to use a spirometer: After a surgery. If you have a lung problem or a history of smoking. After a long period of time when you have been unable to move or be active. If the spirometer includes an indicator to show the highest number that you have reached, your health care  provider or respiratory therapist will help you set a goal. Keep a log of your progress as told by your health care provider. What are the risks? Breathing too quickly may cause dizziness or cause you to pass out. Take your time so you do not get dizzy or light-headed. If you are in pain, you may need to take pain medicine before doing incentive spirometry. It is harder to take a deep breath if you are having pain. How to use your incentive spirometer  Sit up on the edge of your bed or on a chair. Hold the incentive spirometer so  that it is in an upright position. Before you use the spirometer, breathe out normally. Place the mouthpiece in your mouth. Make sure your lips are closed tightly around it. Breathe in slowly and as deeply as you can through your mouth, causing the piston or the ball to rise toward the top of the chamber. Hold your breath for 3-5 seconds, or for as long as possible. If the spirometer includes a coach indicator, use this to guide you in breathing. Slow down your breathing if the indicator goes above the marked areas. Remove the mouthpiece from your mouth and breathe out normally. The piston or ball will return to the bottom of the chamber. Rest for a few seconds, then repeat the steps 10 or more times. Take your time and take a few normal breaths between deep breaths so that you do not get dizzy or light-headed. Do this every 1-2 hours when you are awake. If the spirometer includes a goal marker to show the highest number you have reached (best effort), use this as a goal to work toward during each repetition. After each set of 10 deep breaths, cough a few times. This will help to make sure that your lungs are clear. If you have an incision on your chest or abdomen from surgery, place a pillow or a rolled-up towel firmly against the incision when you cough. This can help to reduce pain while taking deep breaths and coughing. General tips When you are able to get out of  bed: Walk around often. Continue to take deep breaths and cough in order to clear your lungs. Keep using the incentive spirometer until your health care provider says it is okay to stop using it. If you have been in the hospital, you may be told to keep using the spirometer at home. Contact a health care provider if: You are having difficulty using the spirometer. You have trouble using the spirometer as often as instructed. Your pain medicine is not giving enough relief for you to use the spirometer as told. You have a fever. Get help right away if: You develop shortness of breath. You develop a cough with bloody mucus from the lungs. You have fluid or blood coming from an incision site after you cough. Summary An incentive spirometer is a tool that can help you learn to take long, deep breaths to keep your lungs clear and active. You may be asked to use a spirometer after a surgery, if you have a lung problem or a history of smoking, or if you have been inactive for a long period of time. Use your incentive spirometer as instructed every 1-2 hours while you are awake. If you have an incision on your chest or abdomen, place a pillow or a rolled-up towel firmly against your incision when you cough. This will help to reduce pain. Get help right away if you have shortness of breath, you cough up bloody mucus, or blood comes from your incision when you cough. This information is not intended to replace advice given to you by your health care provider. Make sure you discuss any questions you have with your health care provider. Document Revised: 02/09/2020 Document Reviewed: 02/09/2020 Elsevier Patient Education  2023 [employer].   Please go to the following website to access important education materials concerning your upcoming joint replacement.  [email or web address]

## 2024-05-22 LAB — URINE CULTURE: Culture: NO GROWTH

## 2024-05-28 LAB — TYPE AND SCREEN
ABO/RH(D): O POS
Antibody Screen: NEGATIVE

## 2024-05-29 ENCOUNTER — Ambulatory Visit: Payer: Self-pay | Admitting: Urgent Care

## 2024-05-29 ENCOUNTER — Other Ambulatory Visit: Payer: Self-pay

## 2024-05-29 ENCOUNTER — Ambulatory Visit: Admitting: Anesthesiology

## 2024-05-29 ENCOUNTER — Encounter: Payer: Self-pay | Admitting: Surgery

## 2024-05-29 ENCOUNTER — Encounter
Admission: RE | Disposition: A | Discharge status: Home / Self Care | Payer: Self-pay | Source: Home / Self Care | Attending: Surgery

## 2024-05-29 ENCOUNTER — Ambulatory Visit

## 2024-05-29 ENCOUNTER — Ambulatory Visit
Admission: RE | Admit: 2024-05-29 | Discharge: 2024-05-30 | Disposition: A | Discharge status: Home / Self Care | Attending: Surgery | Admitting: Surgery

## 2024-05-29 DIAGNOSIS — Z8673 Personal history of transient ischemic attack (TIA), and cerebral infarction without residual deficits: Secondary | ICD-10-CM | POA: Diagnosis not present

## 2024-05-29 DIAGNOSIS — Z7982 Long term (current) use of aspirin: Secondary | ICD-10-CM | POA: Insufficient documentation

## 2024-05-29 DIAGNOSIS — N182 Chronic kidney disease, stage 2 (mild): Secondary | ICD-10-CM | POA: Diagnosis not present

## 2024-05-29 DIAGNOSIS — Z96641 Presence of right artificial hip joint: Secondary | ICD-10-CM | POA: Diagnosis not present

## 2024-05-29 DIAGNOSIS — M1611 Unilateral primary osteoarthritis, right hip: Secondary | ICD-10-CM | POA: Diagnosis not present

## 2024-05-29 DIAGNOSIS — Z471 Aftercare following joint replacement surgery: Secondary | ICD-10-CM | POA: Diagnosis not present

## 2024-05-29 DIAGNOSIS — I129 Hypertensive chronic kidney disease with stage 1 through stage 4 chronic kidney disease, or unspecified chronic kidney disease: Secondary | ICD-10-CM | POA: Diagnosis not present

## 2024-05-29 HISTORY — PX: TOTAL HIP ARTHROPLASTY: SHX124

## 2024-05-29 SURGERY — ARTHROPLASTY, HIP, TOTAL,POSTERIOR APPROACH
Anesthesia: General | Site: Hip | Laterality: Right

## 2024-05-29 MED ORDER — BISACODYL 10 MG RE SUPP: 10.0000 mg | Freq: Every day | RECTAL | Status: DC | PRN

## 2024-05-29 MED ORDER — HYDROMORPHONE HCL 1 MG/ML IJ SOLN
INTRAMUSCULAR | Status: AC
  Filled 2024-05-29: qty 1

## 2024-05-29 MED ORDER — CEFAZOLIN SODIUM-DEXTROSE 2-4 GM/100ML-% IV SOLN
2.0000 g | Freq: Four times a day (QID) | INTRAVENOUS | Status: AC
  Administered 2024-05-29 (×2): 2 g via INTRAVENOUS
  Filled 2024-05-29 (×2): qty 100

## 2024-05-29 MED ORDER — METOCLOPRAMIDE HCL 5 MG PO TABS: 5.0000 mg | ORAL_TABLET | Freq: Three times a day (TID) | ORAL | Status: DC | PRN

## 2024-05-29 MED ORDER — LEVOTHYROXINE SODIUM 88 MCG PO TABS
88.0000 ug | ORAL_TABLET | Freq: Every day | ORAL | Status: DC
  Administered 2024-05-30: 88 ug via ORAL
  Filled 2024-05-29: qty 1

## 2024-05-29 MED ORDER — MAGNESIUM HYDROXIDE 400 MG/5ML PO SUSP: 30.0000 mL | Freq: Every day | ORAL | Status: DC | PRN

## 2024-05-29 MED ORDER — TRANEXAMIC ACID-NACL 1000-0.7 MG/100ML-% IV SOLN
1000.0000 mg | INTRAVENOUS | Status: AC
  Administered 2024-05-29: 1000 mg via INTRAVENOUS

## 2024-05-29 MED ORDER — TRIAMCINOLONE ACETONIDE 40 MG/ML IJ SUSP
INTRAMUSCULAR | Status: AC
Start: 2024-05-29 — End: 2024-05-29
  Filled 2024-05-29: qty 2

## 2024-05-29 MED ORDER — LIDOCAINE HCL (CARDIAC) PF 100 MG/5ML IV SOSY
PREFILLED_SYRINGE | INTRAVENOUS | Status: DC | PRN
  Administered 2024-05-29: 100 mg via INTRAVENOUS

## 2024-05-29 MED ORDER — ACETAMINOPHEN 500 MG PO TABS
500.0000 mg | ORAL_TABLET | Freq: Four times a day (QID) | ORAL | Status: DC
  Administered 2024-05-29 – 2024-05-30 (×3): 500 mg via ORAL
  Filled 2024-05-29 (×4): qty 1

## 2024-05-29 MED ORDER — PHENYLEPHRINE 80 MCG/ML (10ML) SYRINGE FOR IV PUSH (FOR BLOOD PRESSURE SUPPORT)
PREFILLED_SYRINGE | INTRAVENOUS | Status: DC | PRN
  Administered 2024-05-29: 160 ug via INTRAVENOUS

## 2024-05-29 MED ORDER — CHLORHEXIDINE GLUCONATE 4 % EX SOLN
1.0000 | CUTANEOUS | 1 refills | Status: DC
Start: 2024-05-29 — End: 2024-05-30

## 2024-05-29 MED ORDER — PHENYLEPHRINE HCL-NACL 20-0.9 MG/250ML-% IV SOLN
INTRAVENOUS | Status: AC
  Filled 2024-05-29: qty 250

## 2024-05-29 MED ORDER — FENTANYL CITRATE (PF) 100 MCG/2ML IJ SOLN
INTRAMUSCULAR | Status: DC | PRN
  Administered 2024-05-29 (×2): 50 ug via INTRAVENOUS

## 2024-05-29 MED ORDER — ROCURONIUM BROMIDE 100 MG/10ML IV SOLN
INTRAVENOUS | Status: DC | PRN
  Administered 2024-05-29: 50 mg via INTRAVENOUS
  Administered 2024-05-29: 30 mg via INTRAVENOUS

## 2024-05-29 MED ORDER — SODIUM CHLORIDE 0.9 % IR SOLN
Status: DC | PRN
  Administered 2024-05-29: 3000 mL

## 2024-05-29 MED ORDER — 0.9 % SODIUM CHLORIDE (POUR BTL) OPTIME
TOPICAL | Status: DC | PRN
  Administered 2024-05-29: 500 mL

## 2024-05-29 MED ORDER — ACETAMINOPHEN 325 MG PO TABS: 325.0000 mg | ORAL_TABLET | Freq: Four times a day (QID) | ORAL | Status: DC | PRN

## 2024-05-29 MED ORDER — SODIUM CHLORIDE 0.9 % IV SOLN: INTRAVENOUS | Status: AC

## 2024-05-29 MED ORDER — SEVOFLURANE IN SOLN
RESPIRATORY_TRACT | Status: AC
  Filled 2024-05-29: qty 250

## 2024-05-29 MED ORDER — CEFAZOLIN SODIUM-DEXTROSE 3-4 GM/150ML-% IV SOLN
3.0000 g | Freq: Four times a day (QID) | INTRAVENOUS | Status: DC
  Filled 2024-05-29: qty 150

## 2024-05-29 MED ORDER — ANASTROZOLE 1 MG PO TABS: 1.0000 mg | ORAL_TABLET | Freq: Every day | ORAL | Status: DC

## 2024-05-29 MED ORDER — ONDANSETRON HCL 4 MG/2ML IJ SOLN
4.0000 mg | Freq: Four times a day (QID) | INTRAMUSCULAR | Status: DC | PRN
  Administered 2024-05-29: 4 mg via INTRAVENOUS
  Filled 2024-05-29: qty 2

## 2024-05-29 MED ORDER — KETOROLAC TROMETHAMINE 15 MG/ML IJ SOLN
INTRAMUSCULAR | Status: AC
  Filled 2024-05-29: qty 1

## 2024-05-29 MED ORDER — HYDROMORPHONE HCL 1 MG/ML IJ SOLN
0.5000 mg | INTRAMUSCULAR | Status: AC | PRN
  Administered 2024-05-29 (×4): 0.5 mg via INTRAVENOUS

## 2024-05-29 MED ORDER — ORAL CARE MOUTH RINSE: 15.0000 mL | Freq: Once | OROMUCOSAL | Status: AC

## 2024-05-29 MED ORDER — BUPIVACAINE LIPOSOME 1.3 % IJ SUSP
INTRAMUSCULAR | Status: AC
  Filled 2024-05-29: qty 20

## 2024-05-29 MED ORDER — MIDAZOLAM HCL 2 MG/2ML IJ SOLN
INTRAMUSCULAR | Status: AC
Start: 2024-05-29 — End: 2024-05-29
  Filled 2024-05-29: qty 2

## 2024-05-29 MED ORDER — FENTANYL CITRATE (PF) 100 MCG/2ML IJ SOLN
25.0000 ug | INTRAMUSCULAR | Status: AC | PRN
  Administered 2024-05-29: 25 ug via INTRAVENOUS
  Administered 2024-05-29: 50 ug via INTRAVENOUS
  Administered 2024-05-29: 25 ug via INTRAVENOUS
  Administered 2024-05-29: 50 ug via INTRAVENOUS
  Administered 2024-05-29 (×2): 25 ug via INTRAVENOUS

## 2024-05-29 MED ORDER — ONDANSETRON HCL 4 MG/2ML IJ SOLN: 4.0000 mg | Freq: Once | INTRAMUSCULAR | Status: DC | PRN

## 2024-05-29 MED ORDER — GLYCOPYRROLATE 0.2 MG/ML IJ SOLN
INTRAMUSCULAR | Status: DC | PRN
  Administered 2024-05-29: .1 mg via INTRAVENOUS

## 2024-05-29 MED ORDER — KETOROLAC TROMETHAMINE 15 MG/ML IJ SOLN
7.5000 mg | Freq: Four times a day (QID) | INTRAMUSCULAR | Status: DC
Start: 2024-05-29 — End: 2024-05-30
  Administered 2024-05-29 – 2024-05-30 (×3): 7.5 mg via INTRAVENOUS
  Filled 2024-05-29 (×4): qty 1

## 2024-05-29 MED ORDER — DOCUSATE SODIUM 100 MG PO CAPS
100.0000 mg | ORAL_CAPSULE | Freq: Two times a day (BID) | ORAL | Status: DC
  Administered 2024-05-29 – 2024-05-30 (×3): 100 mg via ORAL
  Filled 2024-05-29 (×3): qty 1

## 2024-05-29 MED ORDER — ESMOLOL HCL 100 MG/10ML IV SOLN
INTRAVENOUS | Status: DC | PRN
  Administered 2024-05-29 (×2): 10 mg via INTRAVENOUS

## 2024-05-29 MED ORDER — ACETAMINOPHEN 10 MG/ML IV SOLN: 1000.0000 mg | Freq: Once | INTRAVENOUS | Status: DC | PRN

## 2024-05-29 MED ORDER — SUGAMMADEX SODIUM 200 MG/2ML IV SOLN
INTRAVENOUS | Status: DC | PRN
  Administered 2024-05-29: 200 mg via INTRAVENOUS

## 2024-05-29 MED ORDER — FENTANYL CITRATE (PF) 100 MCG/2ML IJ SOLN
INTRAMUSCULAR | Status: AC
Start: 2024-05-29 — End: 2024-05-29
  Filled 2024-05-29: qty 2

## 2024-05-29 MED ORDER — MIDAZOLAM HCL 5 MG/5ML IJ SOLN
INTRAMUSCULAR | Status: DC | PRN
  Administered 2024-05-29: 1 mg via INTRAVENOUS

## 2024-05-29 MED ORDER — ADULT MULTIVITAMIN W/MINERALS CH
1.0000 | ORAL_TABLET | Freq: Every day | ORAL | Status: DC
  Administered 2024-05-29 – 2024-05-30 (×2): 1 via ORAL
  Filled 2024-05-29 (×2): qty 1

## 2024-05-29 MED ORDER — TRANEXAMIC ACID-NACL 1000-0.7 MG/100ML-% IV SOLN
INTRAVENOUS | Status: AC
  Filled 2024-05-29: qty 100

## 2024-05-29 MED ORDER — SODIUM CHLORIDE (PF) 0.9 % IJ SOLN
INTRAMUSCULAR | Status: AC
  Filled 2024-05-29: qty 40

## 2024-05-29 MED ORDER — DIPHENHYDRAMINE HCL 12.5 MG/5ML PO ELIX: 12.5000 mg | ORAL_SOLUTION | ORAL | Status: DC | PRN

## 2024-05-29 MED ORDER — FENTANYL CITRATE (PF) 100 MCG/2ML IJ SOLN
INTRAMUSCULAR | Status: AC
  Filled 2024-05-29: qty 2

## 2024-05-29 MED ORDER — ONDANSETRON HCL 4 MG/2ML IJ SOLN
INTRAMUSCULAR | Status: DC | PRN
  Administered 2024-05-29 (×2): 4 mg via INTRAVENOUS

## 2024-05-29 MED ORDER — HYDROCODONE-ACETAMINOPHEN 5-325 MG PO TABS: 1.0000 | ORAL_TABLET | ORAL | Status: DC | PRN

## 2024-05-29 MED ORDER — CHLORHEXIDINE GLUCONATE 0.12 % MT SOLN
OROMUCOSAL | Status: AC
  Filled 2024-05-29: qty 15

## 2024-05-29 MED ORDER — FLEET ENEMA RE ENEM: 1.0000 | ENEMA | Freq: Once | RECTAL | Status: DC | PRN

## 2024-05-29 MED ORDER — VITAMIN D 25 MCG (1000 UNIT) PO TABS
1000.0000 [IU] | ORAL_TABLET | Freq: Every day | ORAL | Status: DC
  Administered 2024-05-29 – 2024-05-30 (×2): 1000 [IU] via ORAL
  Filled 2024-05-29 (×2): qty 1

## 2024-05-29 MED ORDER — KETOROLAC TROMETHAMINE 15 MG/ML IJ SOLN
15.0000 mg | Freq: Once | INTRAMUSCULAR | Status: AC
  Administered 2024-05-29: 15 mg via INTRAVENOUS

## 2024-05-29 MED ORDER — LACTATED RINGERS IV SOLN: INTRAVENOUS | Status: DC

## 2024-05-29 MED ORDER — BUPIVACAINE-EPINEPHRINE (PF) 0.5% -1:200000 IJ SOLN
INTRAMUSCULAR | Status: AC
  Filled 2024-05-29: qty 30

## 2024-05-29 MED ORDER — PHENYLEPHRINE HCL-NACL 20-0.9 MG/250ML-% IV SOLN
INTRAVENOUS | Status: DC | PRN
  Administered 2024-05-29: 25 ug/min via INTRAVENOUS

## 2024-05-29 MED ORDER — PROPOFOL 10 MG/ML IV BOLUS
INTRAVENOUS | Status: DC | PRN
  Administered 2024-05-29: 100 mg via INTRAVENOUS

## 2024-05-29 MED ORDER — METOCLOPRAMIDE HCL 5 MG/ML IJ SOLN
5.0000 mg | Freq: Three times a day (TID) | INTRAMUSCULAR | Status: DC | PRN
  Administered 2024-05-29: 10 mg via INTRAVENOUS
  Filled 2024-05-29: qty 2

## 2024-05-29 MED ORDER — DEXAMETHASONE SODIUM PHOSPHATE 10 MG/ML IJ SOLN
INTRAMUSCULAR | Status: DC | PRN
  Administered 2024-05-29: 10 mg via INTRAVENOUS

## 2024-05-29 MED ORDER — PROPOFOL 1000 MG/100ML IV EMUL
INTRAVENOUS | Status: AC
  Filled 2024-05-29: qty 100

## 2024-05-29 MED ORDER — ACETAMINOPHEN 10 MG/ML IV SOLN
INTRAVENOUS | Status: DC | PRN
  Administered 2024-05-29: 1000 mg via INTRAVENOUS

## 2024-05-29 MED ORDER — KETAMINE HCL 50 MG/5ML IJ SOSY
PREFILLED_SYRINGE | INTRAMUSCULAR | Status: AC
  Filled 2024-05-29: qty 5

## 2024-05-29 MED ORDER — CHLORHEXIDINE GLUCONATE 0.12 % MT SOLN
15.0000 mL | Freq: Once | OROMUCOSAL | Status: AC
  Administered 2024-05-29: 15 mL via OROMUCOSAL

## 2024-05-29 MED ORDER — EZETIMIBE 10 MG PO TABS
10.0000 mg | ORAL_TABLET | Freq: Every day | ORAL | Status: DC
  Administered 2024-05-30: 10 mg via ORAL
  Filled 2024-05-29: qty 1

## 2024-05-29 MED ORDER — CEFAZOLIN SODIUM-DEXTROSE 2-4 GM/100ML-% IV SOLN
2.0000 g | INTRAVENOUS | Status: AC
  Administered 2024-05-29: 2 g via INTRAVENOUS

## 2024-05-29 MED ORDER — TRIAMCINOLONE ACETONIDE 40 MG/ML IJ SUSP
INTRAMUSCULAR | Status: DC | PRN
  Administered 2024-05-29: 93 mL via INTRAMUSCULAR

## 2024-05-29 MED ORDER — APIXABAN 2.5 MG PO TABS
2.5000 mg | ORAL_TABLET | Freq: Two times a day (BID) | ORAL | Status: DC
  Administered 2024-05-30: 2.5 mg via ORAL
  Filled 2024-05-29: qty 1

## 2024-05-29 MED ORDER — MUPIROCIN 2 % EX OINT: 1.0000 | TOPICAL_OINTMENT | Freq: Two times a day (BID) | CUTANEOUS | 0 refills | Status: DC

## 2024-05-29 MED ORDER — CEFAZOLIN SODIUM-DEXTROSE 2-4 GM/100ML-% IV SOLN
INTRAVENOUS | Status: AC
  Filled 2024-05-29: qty 100

## 2024-05-29 MED ORDER — ATORVASTATIN CALCIUM 80 MG PO TABS
80.0000 mg | ORAL_TABLET | Freq: Every day | ORAL | Status: DC
  Administered 2024-05-30: 80 mg via ORAL
  Filled 2024-05-29: qty 1

## 2024-05-29 MED ORDER — HYDROCHLOROTHIAZIDE 12.5 MG PO TABS
12.5000 mg | ORAL_TABLET | Freq: Every day | ORAL | Status: DC
  Administered 2024-05-30: 12.5 mg via ORAL
  Filled 2024-05-29: qty 1

## 2024-05-29 MED ORDER — LISINOPRIL 20 MG PO TABS
40.0000 mg | ORAL_TABLET | Freq: Every day | ORAL | Status: DC
  Administered 2024-05-30: 40 mg via ORAL
  Filled 2024-05-29: qty 2

## 2024-05-29 MED ORDER — PANTOPRAZOLE SODIUM 40 MG PO TBEC
40.0000 mg | DELAYED_RELEASE_TABLET | Freq: Every day | ORAL | Status: DC
  Administered 2024-05-30: 40 mg via ORAL
  Filled 2024-05-29: qty 1

## 2024-05-29 MED ORDER — ONDANSETRON HCL 4 MG PO TABS: 4.0000 mg | ORAL_TABLET | Freq: Four times a day (QID) | ORAL | Status: DC | PRN

## 2024-05-29 SURGICAL SUPPLY — 49 items
BLADE SAGITTAL WIDE XTHICK NO (BLADE) ×1 IMPLANT
BLADE SURG SZ20 CARB STEEL (BLADE) ×1 IMPLANT
CHLORAPREP W/TINT 26 (MISCELLANEOUS) ×1 IMPLANT
DRAPE IMP U-DRAPE 54X76 (DRAPES) IMPLANT
DRAPE INCISE IOBAN 66X60 STRL (DRAPES) ×1 IMPLANT
DRAPE SHEET LG 3/4 BI-LAMINATE (DRAPES) ×1 IMPLANT
DRAPE SURG 17X11 SM STRL (DRAPES) ×2 IMPLANT
DRSG MEPILEX SACRM 8.7X9.8 (GAUZE/BANDAGES/DRESSINGS) ×1 IMPLANT
DRSG OPSITE POSTOP 4X10 (GAUZE/BANDAGES/DRESSINGS) ×1 IMPLANT
ELECT BLADE 6.5 EXT (BLADE) IMPLANT
ELECT CAUTERY BLADE 6.4 (BLADE) ×1 IMPLANT
GAUZE 4X4 16PLY ~~LOC~~+RFID DBL (SPONGE) ×1 IMPLANT
GAUZE XEROFORM 1X8 LF (GAUZE/BANDAGES/DRESSINGS) ×1 IMPLANT
GLOVE BIO SURGEON STRL SZ7.5 (GLOVE) ×4 IMPLANT
GLOVE BIO SURGEON STRL SZ8 (GLOVE) ×4 IMPLANT
GLOVE BIOGEL PI IND STRL 8 (GLOVE) ×1 IMPLANT
GLOVE INDICATOR 8.0 STRL GRN (GLOVE) ×1 IMPLANT
GOWN STRL REUS W/ TWL LRG LVL3 (GOWN DISPOSABLE) ×1 IMPLANT
GOWN STRL REUS W/ TWL XL LVL3 (GOWN DISPOSABLE) ×1 IMPLANT
HEAD CERAMIC BIOLOX 32MM (Head) IMPLANT
HOLSTER ELECTROSUGICAL PENCIL (MISCELLANEOUS) ×1 IMPLANT
HOOD PEEL AWAY T7 (MISCELLANEOUS) ×3 IMPLANT
KIT PREVENA INCISION MGT20CM45 (CANNISTER) IMPLANT
KIT TURNOVER KIT A (KITS) ×1 IMPLANT
LINER ACE HIGH WALL G7 B 32 (Liner) IMPLANT
MANIFOLD NEPTUNE II (INSTRUMENTS) ×1 IMPLANT
NDL FILTER BLUNT 18X1 1/2 (NEEDLE) ×1 IMPLANT
NDL SAFETY ECLIPSE 18X1.5 (NEEDLE) ×2 IMPLANT
NDL SPNL 20GX3.5 QUINCKE YW (NEEDLE) ×1 IMPLANT
NEEDLE FILTER BLUNT 18X1 1/2 (NEEDLE) ×1 IMPLANT
NEEDLE SPNL 20GX3.5 QUINCKE YW (NEEDLE) ×1 IMPLANT
PACK HIP PROSTHESIS (MISCELLANEOUS) ×1 IMPLANT
PENCIL SMOKE EVACUATOR (MISCELLANEOUS) ×1 IMPLANT
SHELL ACET G7 3H 46 SZB (Shell) IMPLANT
SLEEVE HIP BIOLOX -6MM OFFSET (Sleeve) IMPLANT
SOL .9 NS 3000ML IRR UROMATIC (IV SOLUTION) ×1 IMPLANT
SPONGE T-LAP 18X18 ~~LOC~~+RFID (SPONGE) IMPLANT
STAPLER SKIN PROX 35W (STAPLE) ×1 IMPLANT
STEM COLLARLESS FULL 11X135 (Stem) IMPLANT
SUT TICRON 2-0 30IN 311381 (SUTURE) ×3 IMPLANT
SUT VIC AB 0 CT1 36 (SUTURE) ×1 IMPLANT
SUT VIC AB 1 CT1 36 (SUTURE) ×1 IMPLANT
SUT VIC AB 2-0 CT1 (SUTURE) ×3 IMPLANT
SYR 10ML LL (SYRINGE) ×1 IMPLANT
SYR 20ML LL LF (SYRINGE) ×1 IMPLANT
SYR 30ML LL (SYRINGE) ×2 IMPLANT
TIP FAN IRRIG PULSAVAC PLUS (DISPOSABLE) ×1 IMPLANT
TRAP FLUID SMOKE EVACUATOR (MISCELLANEOUS) ×1 IMPLANT
WATER STERILE IRR 1000ML POUR (IV SOLUTION) ×1 IMPLANT

## 2024-05-29 NOTE — Anesthesia Procedure Notes (Signed)
 Procedure Name: Intubation Date/Time: 05/29/2024 9:42 AM  Performed by: Ledora Duncan, CRNAPre-anesthesia Checklist: Patient identified, Emergency Drugs available, Suction available and Patient being monitored Patient Re-evaluated:Patient Re-evaluated prior to induction Oxygen Delivery Method: Circle system utilized Preoxygenation: Pre-oxygenation with 100% oxygen Induction Type: IV induction Ventilation: Mask ventilation without difficulty Laryngoscope Size: McGrath and 3 Grade View: Grade I Tube type: Oral Tube size: 6.5 mm Number of attempts: 1 Airway Equipment and Method: Stylet and Oral airway Placement Confirmation: ETT inserted through vocal cords under direct vision, positive ETCO2 and breath sounds checked- equal and bilateral Secured at: 21 cm Tube secured with: Tape Dental Injury: Teeth and Oropharynx as per pre-operative assessment

## 2024-05-29 NOTE — Op Note (Signed)
 05/29/2024  12:01 PM  Patient:   Bailey Brooks  Pre-Op Diagnosis:   Degenerative joint disease, right hip.  Post-Op Diagnosis:   Same.  Procedure:   Right total hip arthroplasty.  Surgeon:   DOROTHA Reyes Maltos, MD  Assistant:   Gustavo Level, PA-C; [place] Lee Napolitano, PA-S  Anesthesia:   GET  Findings:   As above.  Complications:   None  EBL:   150 cc  Fluids:   1000 cc crystalloid  UOP:   None  TT:   None  Drains:   None  Closure:   Staples  Implants:   Biomet press-fit system with a # 11 laterally offset Echo femoral stem, a 46 mm acetabular shell with an E-poly hi-wall liner, and a 32 mm ceramic head with a -6 mm neck.  Brief Clinical Note:   The patient is a 73 year old female with a history of progressively worsening right hip/groin pain. Her symptoms have progressed despite medications, activity modification, etc. Her history and examination are consistent with degenerative joint disease confirmed by plain radiographs. The patient presents at this time for a right total hip arthroplasty.   Procedure:   The patient was brought into the operating room and lain in the supine position. After adequate general endotracheal intubation and anesthesia was obtained, the patient was repositioned in the left lateral decubitus position and secured using a lateral hip positioner. The right hip and lower extremity were prepped with ChloroPrep solution before being draped sterilely. Preoperative antibiotics were administered. A timeout was performed to verify the appropriate surgical site.    A standard posterior approach to the hip was made through an approximately 7-8 inch incision. The incision was carried down through the subcutaneous tissues to expose the gluteal fascia and proximal end of the iliotibial band. These structures were split the length of the incision and the Charnley self-retaining hip retractor placed. The bursal tissues were swept posteriorly to expose the short  external rotators. The anterior border of the piriformis tendon was identified and this plane developed down through the capsule to enter the joint. A flap of tissue was elevated off the posterior aspect of the femoral neck and greater trochanter and retracted posteriorly. This flap included the piriformis tendon, the short external rotators, and the posterior capsule. The soft tissues were elevated off the lateral aspect of the ilium and a large Steinmann pin placed bicortically.   With the right leg aligned over the left, a drill bit was placed into the greater trochanter parallel to the Steinmann pin and the distance between these two pins measured in order to optimize leg lengths postoperatively. The drill bit was removed and the hip dislocated. The piriformis fossa was debrided of soft tissues before the intramedullary canal was accessed through this point using a triple step reamer. The canal was reamed sequentially beginning with a #7 tapered reamer and progressing to a #11 tapered reamer. This provided excellent circumferential chatter. Using the appropriate guide, a femoral neck cut was made 10-12 mm above the lesser trochanter. The femoral head was removed.  Attention was directed to the acetabular side. The labrum was debrided circumferentially before the ligamentum teres was removed using a large curette. A line was drawn on the drapes corresponding to the native version of the acetabulum. This line was used as a guide while the acetabulum was reamed sequentially beginning with a 41 mm reamer and progressing to a 45 mm reamer. This provided excellent circumferential chatter. The 45 mm trial acetabulum  was positioned and found to fit quite well. Therefore, the 46 mm acetabular shell was selected and impacted into place with care taken to maintain the appropriate version. The trial high wall liner was inserted.  Attention was redirected to the femoral side. A box osteotome was used to establish  version before the canal was broached sequentially beginning with a #7 broach and progressing to a # 11 broach. This was left in place and several trial reductions performed using both the standard and laterally offset neck options, as well as the -6 mm and -3 mm neck lengths. After removing the trial components, the manhole cover was placed into the apex of the acetabular shell and tightened securely.   The permanent E-polyethylene hi-wall liner was impacted into the acetabular shell and its locking mechanism verified using a quarter-inch osteotome. Next, the #11 laterally offset femoral stem was impacted into place with care taken to maintain the appropriate version. A repeat trial reduction was performed using the -6 mm and -3 mm neck lengths. The -6 mm neck length demonstrated excellent stability both in extension and external rotation as well as with flexion to 90 and internal rotation beyond 70. It also was stable in the position of sleep. In addition, leg lengths appeared to be restored appropriately, both by reassessing the position of the right leg over the left, as well as by measuring the distance between the Steinmann pin and the drill bit. The 32 mm ceramic head with the -6 mm neck was impacted onto the stem of the femoral component. The Morse taper locking mechanism was verified using manual distraction before the head was relocated and placed through a range of motion with the findings as described above.  The wound was copiously irrigated with sterile saline solution via the jet lavage system before the peri-incisional and pericapsular tissues were injected with a cocktail of 20 cc of Exparel , 30 cc of 0.5% Sensorcaine , 2 cc of Kenalog  40 (80 mg), and 30 mg of Toradol  diluted out to 90 cc with normal saline to help with postoperative analgesia. The posterior flap was reapproximated to the posterior aspect of the greater trochanter using #2 Tycron interrupted sutures placed through drill  holes. Several additional #2 Tycron interrupted sutures were used to reinforce this layer of closure. The iliotibial band was reapproximated using #1 Vicryl interrupted sutures before the gluteal fascia was closed using a running #0 Vicryl suture. The subcutaneous tissues were closed in several layers using 2-0 Vicryl interrupted sutures before the skin was closed using staples. A sterile occlusive dressing was applied to the wound. The patient was then rolled back into the supine position on her hospital bed before being awakened, extubated, and returned to the recovery room in satisfactory condition after tolerating the procedure well.

## 2024-05-29 NOTE — Transfer of Care (Signed)
 Immediate Anesthesia Transfer of Care Note  Patient: Bailey Brooks  Procedure(s) Performed: ARTHROPLASTY, HIP, TOTAL,POSTERIOR APPROACH (Right: Hip)  Patient Location: PACU  Anesthesia Type:General  Level of Consciousness: awake and alert   Airway & Oxygen Therapy: Patient Spontanous Breathing and Patient connected to face mask oxygen  Post-op Assessment: Report given to RN and Post -op Vital signs reviewed and stable  Post vital signs: stable  Last Vitals:  Vitals Value Taken Time  BP 144/60 05/29/24 12:20  Temp    Pulse 70 05/29/24 12:23  Resp 17 05/29/24 12:23  SpO2 100 % 05/29/24 12:23  Vitals shown include unfiled device data.  Last Pain:  Vitals:   05/29/24 0810  TempSrc: Temporal  PainSc: 10-Worst pain ever         Complications: No notable events documented.

## 2024-05-29 NOTE — Progress Notes (Signed)
 PT Cancellation Note  Patient Details Name: Bailey Brooks MRN: [id number] DOB: 1951/06/30   Cancelled Treatment:    Reason Eval/Treat Not Completed: Other (comment). Orders received and chart reviewed. Upon entry to room pt very drowsy. Difficult to stay awake with subjective gathering and reporting being light headed. Falls asleep between questions. BP and HR assessed at 135/53 mm Hg and 54 BPM. Pt in agreement deferring evaluation this date and re-attempt tomorrow.    Dorina HERO. Fairly IV, PT, DPT Physical Therapist- [place]  [facility]  05/29/2024, 3:23 PM

## 2024-05-29 NOTE — Anesthesia Preprocedure Evaluation (Signed)
 Anesthesia Evaluation  Patient identified by MRN, date of birth, ID band Patient awake    Reviewed: Allergy & Precautions, NPO status , Patient's Chart, lab work & pertinent test results  History of Anesthesia Complications Negative for: history of anesthetic complications  Airway Mallampati: II  TM Distance: >3 FB Neck ROM: Full    Dental no notable dental hx. (+) Teeth Intact   Pulmonary neg pulmonary ROS, neg sleep apnea, neg COPD, Patient abstained from smoking.Not current smoker   Pulmonary exam normal breath sounds clear to auscultation       Cardiovascular Exercise Tolerance: Good METShypertension, Pt. on medications (-) CAD and (-) Past MI (-) dysrhythmias  Rhythm:Regular Rate:Normal - Systolic murmurs    Neuro/Psych  PSYCHIATRIC DISORDERS Anxiety     3 TIAs, manifesting as expressive aphasia. No residual symptoms TIA   GI/Hepatic ,GERD  Medicated and Controlled,,(+)     (-) substance abuse    Endo/Other  neg diabetesHypothyroidism  Class 3 obesity  Renal/GU CRFRenal disease     Musculoskeletal  (+) Arthritis ,    Abdominal  (+) + obese  Peds  Hematology  (+) Blood dyscrasia, anemia Prior lymphoma, treated, in remission.    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 TTE 03/08/2022: EF               60-65%, no rwma, nl RVSF, mild MR, AoV sclerosis with no               stenosis 10/2010: Diffuse large B cell lymphoma (HCC)     Comment:  a.) (+) mesenteric mass and hepatic involvement; b.) s/p              RCHOP + MTX (x6 systemic + x4 intrathecal), c/b renal               failure; c.) s/p BEAM chemotherapy prior to autologuous               SCT No date: Diverticulosis No date: Eczema No date: Esophagitis No date: Family history of breast cancer No date: Family history of melanoma No date: Family history of prostate cancer No date: GERD (gastroesophageal reflux disease) 06/2011: H/O stem cell transplant (HCC)     Comment:  a.) autogolous SCT as adjuvant treatment of DLBCL No date: Hemorrhoids 10/01/2023: History of breast cancer 05/19/2010: History of stress test     Comment:  a.) Myoview 05/19/2010: nl EF, no ischemia/infarct. No date: Hypercholesterolemia No date: Hypertension 02/10/2017: Hypokalemia 03/02/2016: Hypomagnesemia No date: Hypothyroidism No date: Long term current use of aromatase inhibitor No date: Morbid obesity (HCC) 11/13/2012: Muscle cramps 2012: [place] reservoir present     Comment:  a.) placed for intrathecal chemotherapy; remains in               place as of 11/2022 05/13/2013: Paget's disease of vulva (HCC)     Comment:  a.) Bx (+) IPN of vulva consistent with Pagets;               resected; b.) s/p re-excision 09/29/2015 No date: Pre-diabetes No date: Stroke [facility]) No date: Syncope No date: TIA (transient ischemic attack)     Comment:  a.) s/p TIA 01/02/2011; b.) s/p TIA 02/10/2017; c.) s/p               TIA 04/02/2022; d.) 02/2017 event monitor: No significant               arrhythmias. No date: Tubular adenoma of colon  Reproductive/Obstetrics                             Anesthesia Physical Anesthesia Plan  ASA: 3  Anesthesia Plan: General   Post-op Pain Management:  Ofirmev  IV (intra-op)* and Ketamine  IV*   Induction: Intravenous  PONV Risk Score and Plan: 4 or greater and Ondansetron , Dexamethasone , Midazolam  and Treatment may vary due to age or medical condition  Airway Management Planned: Oral ETT and Video Laryngoscope Planned  Additional Equipment: None  Intra-op Plan:   Post-operative Plan: Extubation in OR  Informed Consent: I have reviewed the patients History and Physical, chart, labs and discussed the procedure including the risks, benefits and alternatives for the proposed anesthesia with the patient or authorized representative who has indicated his/her understanding and acceptance.     Dental advisory given  Plan Discussed with: CRNA and Surgeon  Anesthesia Plan Comments: (Spoke at length and in great detail the r/b/a of neuraxial anesthesia vs GETA. Patient's concerns with neuraxial include headache risk (reassured this is exceedingly rare in this patient population), and concern about ability to position herself properly either sitting or lateral, due to severe hip pain in those positions ( I did attempt to reassure her that she would be medicated with analgesics and anxiolytics prior to positioning). Patient ultimately elected for GETA. Discussed risks of anesthesia with patient, including PONV, sore throat, lip/dental/eye damage. Rare risks discussed as well, such as cardiorespiratory and neurological sequelae, and allergic reactions. Discussed the role of CRNA in patient's perioperative care. Patient understands.)       Anesthesia Quick Evaluation

## 2024-05-29 NOTE — Anesthesia Postprocedure Evaluation (Signed)
 Anesthesia Post Note  Patient: Bailey Brooks  Procedure(s) Performed: ARTHROPLASTY, HIP, TOTAL,POSTERIOR APPROACH (Right: Hip)  Patient location during evaluation: PACU Anesthesia Type: General Level of consciousness: awake and alert Pain management: pain level controlled Vital Signs Assessment: post-procedure vital signs reviewed and stable Respiratory status: spontaneous breathing, nonlabored ventilation, respiratory function stable and patient connected to nasal cannula oxygen Cardiovascular status: blood pressure returned to baseline and stable Postop Assessment: no apparent nausea or vomiting Anesthetic complications: no   No notable events documented.   Last Vitals:  Vitals:   05/29/24 1225 05/29/24 1230  BP:    Pulse: 73 65  Resp: 13 13  Temp:    SpO2: 100% 100%    Last Pain:  Vitals:   05/29/24 1230  TempSrc:   PainSc: 10-Worst pain ever                 Rome Ade

## 2024-05-29 NOTE — H&P (Signed)
 History of Present Illness: Bailey Brooks is a 73 y.o. female who presents today for her surgical history and physical for upcoming right total hip arthroplasty scheduled with Dr. Edie on 05/29/2024. The patient denies any changes in her medical history since she was last evaluated. She reports a 7 out of 10 pain score in the right hip at today's visit. She is taking naproxen  in addition to Tylenol  as needed for discomfort. The patient does have a history of lymphoma. She does have a history of TIA but is not on any prescription blood thinners, she does take a baby aspirin  routinely. She denies any personal history of heart attack. She denies any history of asthma or COPD. No personal history of DVT. She is not diabetic. She reports moderate aching discomfort especially with prolonged periods of walking and standing. The patient denies any bowel or bladder dysfunction. She does report that symptoms improve when she is able to lay down and sit for prolonged period time. She has undergone a right hip intra-articular steroid injection in the past which did provide moderate relief for a period of time.  Past Medical History: Colon polyp 2014 (adenomatous)  Duodenitis 05/21/2014  Esophagitis  Hiatal hernia 05/21/2014  History of chemotherapy  Hx of adenomatous colonic polyps 01/22/2019  Lymphoma (CMS/HHS-HCC)   Past Surgical History: COLONOSCOPY 12/22/2008 (Adenomatous Polyp)  UPPER GASTROINTESTINAL ENDOSCOPY 2014  COLONOSCOPY 03/18/2013 (PH Adenomatous Polyp: CBF 03/2018)  COLONOSCOPY 01/14/2021 (Tubular adenoma/PHx CP/Repeat 82yrs/TKT)  EGD 01/14/2021 (Normal EGD/Repeat as needed/TKT)  Reverse left total shoulder arthroplasty with biceps tenodesis. Left 11/14/2022 (Dr.Alissandra Geoffroy)  ABDOMINAL HYSTERECTOMY  BREAST BIOPSY  omayan resevior  stem cell transplant  TONSILLECTOMY   Past Family History: Gallbladder disease Mother  Melanoma Father  Prostate cancer Brother  Gallbladder disease Brother  Breast  cancer Sister  Ovarian cancer Neg Hx   Medications: acetaminophen  (TYLENOL ) 650 MG ER tablet Take 650 mg by mouth 2 (two) times daily as needed  ANASTROZOLE  ORAL Take by mouth once daily  aspirin  81 MG EC tablet Take 1 tablet by mouth once daily  atorvastatin  (LIPITOR) 40 MG tablet Take 1 tablet by mouth once daily  cephalexin  (KEFLEX ) 500 MG capsule  clobetasoL  (TEMOVATE ) 0.05 % cream Apply topically continuously as needed  diazePAM  (VALIUM ) 2 MG tablet 1-2 30 minutes before MRI 2 tablet 0  ezetimibe  (ZETIA ) 10 mg tablet  hydroCHLOROthiazide  (HYDRODIURIL ) 12.5 MG tablet Take 12.5 mg by mouth once daily  lansoprazole  (PREVACID ) 30 MG DR capsule Take 1 capsule (30 mg total) by mouth once daily. 30 capsule 9  levothyroxine  (SYNTHROID ) 88 MCG tablet Take 88 mcg by mouth once daily Take on an empty stomach with a glass of water at least 30-60 minutes before breakfast.  lisinopriL  (ZESTRIL ) 40 MG tablet Take 1 tablet by mouth once daily  lutein  10 mg Tab Take 1 tablet by mouth once daily  mometasone  (ELOCON ) 0.1 % cream 1 Application 2 (two) times daily as needed  multivitamin tablet Take 1 tablet by mouth once daily  naproxen  (NAPROSYN ) 500 MG tablet Take 500 mg by mouth once as needed  tacrolimus  (PROTOPIC ) 0.1 % ointment APPLY TOPICALLY TO THE AFFECTED AREA 1 TO 2 TIMES DAILY AS NEEDED FOR ITCHING   Allergies: Iodine Swelling (IV iodine)  Morphine Nausea and Hallucination  Adhesive Rash (Including band-aide)  Erythromycin Abdominal Pain  Sulfa (Sulfonamide Antibiotics) Unknown   Review of Systems:  A comprehensive 14 point ROS was performed, reviewed by me today, and the pertinent orthopaedic findings are  documented in the HPI.  Physical Exam: BP 134/84  Ht 160 cm (5' 3)  Wt (!) 110 kg (242 lb 9.6 oz)  BMI 42.97 kg/m  General/Constitutional: The patient appears to be well-nourished, well-developed, and in no acute distress. Neuro/Psych: Normal mood and affect, oriented to  person, place and time. Eyes: Non-icteric. Pupils are equal, round, and reactive to light, and exhibit synchronous movement. ENT: Unremarkable. Lymphatic: No palpable adenopathy. Respiratory: Lungs clear to auscultation, Normal chest excursion, No wheezes, and Non-labored breathing Cardiovascular: Regular rate and rhythm. No murmurs. and No edema, swelling or tenderness, except as noted in detailed exam. Integumentary: No impressive skin lesions present, except as noted in detailed exam. Musculoskeletal: Unremarkable, except as noted in detailed exam.  Lumbar exam: Skin inspection of the lower back is unremarkable. In stance, her spine is straight and her pelvis is level. She can arise from a seated position with some difficulty, and demonstrates an antalgic gait, favoring her right leg. She has no tenderness along the thoracic or lumbar spine, nor across the sacrum. She has mild pain diffusely over both trochanteric regions in both buttock regions, possibly consistent with fibromyalgia. She can heel raise and toe raise without difficulty or weakness.   Hip exam: She has moderate pain with right hip flexion, internal rotation, and external rotation. She exhibits flexion to 90 degrees, internal rotation to 10 degrees, and external rotation to 30 degrees on the right. She is neurovascularly intact to both lower extremities. She has negative sitting straight leg raises bilaterally.  Imaging: X-rays of the pelvis and right hip were obtained at a previous visit reviewed by me today. These films demonstrate moderate to severe degenerative changes of the right hip as manifested by superomedial joint space narrowing and early osteophyte formation. No lytic lesions or fractures are identified. Incidental note is made of similar moderate to severe degenerative changes of the left hip.  Impression: Primary osteoarthritis of right hip.  Plan:  1. Treatment options were discussed today with the patient. 2.  The patient is scheduled for a right total hip arthroplasty with Dr. Edie on 05/29/2024. 3. The patient was instructed on the risk and benefits of a right total hip arthroplasty at today's visit. After a detailed discussion of the risk and benefits of surgical intervention the patient would like to proceed with surgery at this time. 4. This document will serve as a surgical history and physical for the patient. 5. The patient will follow-up per standard postop protocol. They can call the clinic they have any questions, new symptoms develop or symptoms worsen.  The procedure was discussed with the patient, as were the potential risks (including bleeding, infection, nerve and/or blood vessel injury, persistent or recurrent pain, failure of the hardware, dislocation, leg length inequality, heterotopic ossification, need for further surgery, blood clots, strokes, heart attacks and/or arhythmias, pneumonia, etc.) and benefits. The patient states her understanding and wishes to proceed.   H&P reviewed and patient re-examined. No changes.

## 2024-05-29 NOTE — Progress Notes (Signed)
 Bruise noted on left anterior arm, injury date in February.

## 2024-05-30 ENCOUNTER — Telehealth (HOSPITAL_COMMUNITY): Payer: Self-pay | Admitting: Pharmacy Technician

## 2024-05-30 ENCOUNTER — Encounter: Payer: Self-pay | Admitting: Surgery

## 2024-05-30 ENCOUNTER — Other Ambulatory Visit (HOSPITAL_COMMUNITY): Payer: Self-pay

## 2024-05-30 DIAGNOSIS — Z7982 Long term (current) use of aspirin: Secondary | ICD-10-CM | POA: Diagnosis not present

## 2024-05-30 DIAGNOSIS — Z8673 Personal history of transient ischemic attack (TIA), and cerebral infarction without residual deficits: Secondary | ICD-10-CM | POA: Diagnosis not present

## 2024-05-30 DIAGNOSIS — M1611 Unilateral primary osteoarthritis, right hip: Secondary | ICD-10-CM | POA: Diagnosis not present

## 2024-05-30 LAB — BASIC METABOLIC PANEL WITH GFR
Anion gap: 8 (ref 5–15)
BUN: 29 mg/dL — ABNORMAL HIGH (ref 8–23)
CO2: 25 mmol/L (ref 22–32)
Calcium: 8.7 mg/dL — ABNORMAL LOW (ref 8.9–10.3)
Chloride: 106 mmol/L (ref 98–111)
Creatinine, Ser: 0.97 mg/dL (ref 0.44–1.00)
GFR, Estimated: >60 mL/min (ref >60)
Glucose, Bld: 143 mg/dL — ABNORMAL HIGH (ref 70–99)
Potassium: 4.2 mmol/L (ref 3.5–5.1)
Sodium: 139 mmol/L (ref 135–145)

## 2024-05-30 LAB — CBC
HCT: 33.4 % — ABNORMAL LOW (ref 36.0–46.0)
Hemoglobin: 10.9 g/dL — ABNORMAL LOW (ref 12.0–15.0)
MCH: 28.8 pg (ref 26.0–34.0)
MCHC: 32.6 g/dL (ref 30.0–36.0)
MCV: 88.1 fL (ref 80.0–100.0)
Platelets: 197 10*3/uL (ref 150–400)
RBC: 3.79 MIL/uL — ABNORMAL LOW (ref 3.87–5.11)
RDW: 13.8 % (ref 11.5–15.5)
WBC: 11.8 10*3/uL — ABNORMAL HIGH (ref 4.0–10.5)
nRBC: 0 % (ref 0.0–0.2)

## 2024-05-30 MED ORDER — APIXABAN 2.5 MG PO TABS: 2.5000 mg | ORAL_TABLET | Freq: Two times a day (BID) | ORAL | 0 refills | Status: DC

## 2024-05-30 MED ORDER — HYDROCODONE-ACETAMINOPHEN 5-325 MG PO TABS: 1.0000 | ORAL_TABLET | Freq: Four times a day (QID) | ORAL | 0 refills | Status: DC | PRN

## 2024-05-30 MED ORDER — CHLORHEXIDINE GLUCONATE 4 % EX SOLN: 1.0000 | CUTANEOUS | 1 refills | Status: DC

## 2024-05-30 MED ORDER — MUPIROCIN 2 % EX OINT
1.0000 | TOPICAL_OINTMENT | Freq: Two times a day (BID) | CUTANEOUS | 0 refills | Status: AC
Start: 2024-05-30 — End: 2024-06-29

## 2024-05-30 MED ORDER — ONDANSETRON HCL 4 MG PO TABS: 4.0000 mg | ORAL_TABLET | Freq: Four times a day (QID) | ORAL | 0 refills | Status: DC | PRN

## 2024-05-30 NOTE — Plan of Care (Signed)

## 2024-05-30 NOTE — TOC Transition Note (Signed)
 Transition of Care [facility]) - Discharge Note   Patient Details  Name: Bailey Brooks MRN: [id number] Date of Birth: [birth year]  [facility] CM/SW Contact:  Quintella Suzen Jansky, RN Phone Number: 05/30/2024, 3:39 PM   Clinical Narrative:     Patient to discharge today, home with home health services. Patient set up with [facility] by surgeon's office prior to surgery. Agency information added to AVS.   Final next level of care: Home w Home Health Services Barriers to Discharge: Barriers Resolved   Patient Goals and CMS Choice            Discharge Placement                  Name of family member notified: Eveline Patient and family notified of of transfer: 05/30/24  Discharge Plan and Services Additional resources added to the After Visit Summary for                    DME Agency: NA       HH Arranged: PT, OT HH Agency: [facility] Date [facility] Agency Contacted: 05/30/24 Time HH Agency Contacted: [phone] Representative spoke with at [facility] Agency: [place]   Social Drivers of Health (SDOH) Interventions SDOH Screenings   Food Insecurity: No Food Insecurity (05/29/2024)  Housing: Low Risk  (05/29/2024)  Transportation Needs: No Transportation Needs (05/29/2024)  Utilities: Not At Risk (05/29/2024)  Alcohol Screen: Low Risk  (04/26/2023)  Depression (PHQ2-9): Low Risk  (03/05/2024)  Financial Resource Strain: Low Risk  (12/27/2023)  Physical Activity: Inactive (12/27/2023)  Social Connections: Socially Integrated (05/29/2024)  Stress: No Stress Concern Present (12/27/2023)  Tobacco Use: Low Risk  (05/29/2024)     Readmission Risk Interventions     No data to display

## 2024-05-30 NOTE — Discharge Summary (Signed)
 Physician Discharge Summary  Patient ID: Bailey Brooks MRN: [id number] DOB/AGE: 74-Sep-1952 74 y.o.  Admit date: 05/29/2024 Discharge date: 05/30/2024  Admission Diagnoses:  Primary osteoarthritis of right hip [M16.11] Status post total hip replacement, right [Z96.641]   Discharge Diagnoses: Patient Active Problem List   Diagnosis Date Noted   Status post total hip replacement, right 05/29/2024   Stage 2 chronic kidney disease 03/05/2024   Skin tear of left forearm without complication 01/09/2024   Osteoarthritis of hips (Bilateral) 12/13/2023   Morbid obesity with body mass index (BMI) of 40.0 to 44.9 in adult [facility]) 12/13/2023   Spondylosis without myelopathy or radiculopathy, lumbosacral region 12/04/2023   Decreased range of motion of hips (Bilateral) 12/04/2023   Abnormal MRI, lumbar spine (11/08/2023) 11/15/2023   DDD (degenerative disc disease), lumbosacral 11/15/2023   History of Allergy to iodine 11/15/2023    Class: History of   Anxiety about treatment 10/24/2023   Chronic pain syndrome 10/01/2023   Pharmacologic therapy 10/01/2023   Problems influencing health status 10/01/2023   Chronic groin pain  (2ry area of Pain) (Right) 10/01/2023   Spinal stenosis, lumbar region, with neurogenic claudication 10/01/2023   Chronic shoulder pain (5th area of Pain) (Bilateral) 10/01/2023   Albuminuria 08/24/2023   Nail dystrophy 08/24/2023   Rotator cuff arthropathy, left 11/17/2022   Status post reverse arthroplasty of shoulder, left 11/14/2022   Eye abnormality 10/30/2022   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 04/24/2022   Lumbar spondylosis 04/24/2022   TIA (transient ischemic attack) 04/02/2022   Rotator cuff tear 12/09/2021   Syncope 09/03/2020   Low serum vitamin B12 05/28/2020   Fatigue 03/24/2020   Genetic testing 03/23/2020   Carcinoma of upper-outer quadrant of right breast in female, estrogen receptor positive (HCC)  10/28/2019   Pain due to onychomycosis of toenails of both feet 06/09/2019   Prediabetes 12/20/2018   Macular degeneration 06/20/2018   GERD (gastroesophageal reflux disease) 06/20/2018   Hypersomnia 06/20/2018   Chronic knee pain (4th area of Pain) (Bilateral) (R>L) 06/20/2018   History of lymphoma 06/11/2017   Vulvar lesion 05/16/2017   Chronic low back pain (1ry area of Pain) (Bilateral) (R>L) w/o sciatica 04/03/2017   Chronic hip pain (3ry area of Pain) (Right) 08/03/2016   Essential hypertension 07/06/2016   Morbid obesity due to excess calories (HCC) 03/02/2016   Adjustment disorder with mixed anxiety and depressed mood 05/25/2015   Paget disease, 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   Eczema 02/27/2013   Hypothyroidism 02/20/2012   HLD (hyperlipidemia) 02/20/2012    Past Medical History:  Diagnosis Date   Adult pulmonary Langerhans cell histiocytosis (HCC)    Eosinophilic Granuloma of the Lung)   Allergic rhinitis    Antineoplastic chemotherapy induced anemia    Arthritis    Breast cancer, right (HCC) 09/17/2019   a.) Bx (+) stage 1 IMC (G1, ER/PR +, Her2/neu -); pT1a N0 M0; Tx'd with adjuvant XRT + endocrine therapy   Bruising 12/14/2017   Complication of anesthesia    pt was awake during intubation for colonoscopy   Diastolic dysfunction    a.) TTE 11/14/2016: EF 60-65%, no rwma, mild LVH, triv MR, mildly dil LA, nl RVSF, G1DD; b. 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   Tubular adenoma of colon      Transfusion: None.   Consultants (if any):   Discharged Condition: Improved  Hospital Course: Bailey Brooks is an 73 y.o. female who was admitted 05/29/2024 with a diagnosis of primary osteoarthritis of the right hip and went to the operating room on 05/29/2024 and underwent the above named procedures.    Surgeries: Procedure(s): ARTHROPLASTY, HIP, TOTAL,POSTERIOR APPROACH on 05/29/2024 Patient tolerated the surgery well. Taken to PACU where she was stabilized and then transferred to the orthopedic floor.  Started on Eliquis  2.5mg  every 12 hours. Heels elevated on bed with rolled towels. No evidence of DVT. Negative Homan. Physical therapy started on day #1 for gait training and transfer. OT started day #1 for ADL and assisted devices.  Patient's IV was removed on POD1.  Implants: Biomet press-fit system with a # 11 laterally offset Echo femoral stem, a 46 mm acetabular shell with an E-poly hi-wall liner, and a 32 mm ceramic head with a -6 mm neck.   She was given perioperative antibiotics:  Anti-infectives (From admission,  onward)    Start     Dose/Rate Route Frequency Ordered Stop   05/29/24 1600  ceFAZolin  (ANCEF ) IVPB 3g/150 mL premix  Status:  Discontinued        3 g 300 mL/hr over 30 Minutes Intravenous Every 6 hours 05/29/24 1446 05/29/24 1457   05/29/24 1600  ceFAZolin  (ANCEF ) IVPB 2g/100 mL premix        2 g 200 mL/hr over 30 Minutes Intravenous Every 6 hours 05/29/24 1457 05/29/24 2316   05/29/24 0800  ceFAZolin  (ANCEF ) IVPB 2g/100 mL premix        2 g 200 mL/hr over 30 Minutes Intravenous On call to O.R. 05/29/24 9242 05/29/24 9041     .  She was given sequential compression devices, early ambulation, and Eliquis  for DVT prophylaxis.  She benefited maximally from the hospital stay and there were no complications.    Recent vital signs:  Vitals:   05/30/24 0000 05/30/24 0312  BP: (!) 135/59 139/63  Pulse: 72 66  Resp: 16 20  Temp: 98.3 F (36.8 C) 99.2 F (37.3 C)  SpO2: 94% 95%  Recent laboratory studies:  Lab Results  Component Value Date   HGB 10.9 (L) 05/30/2024   HGB 12.9 05/21/2024   HGB 12.9 02/22/2024   Lab Results  Component Value Date   WBC 11.8 (H) 05/30/2024   PLT 197 05/30/2024   Lab Results  Component Value Date   INR 1.0 05/05/2022   Lab Results  Component Value Date   NA 139 05/30/2024   K 4.2 05/30/2024   CL 106 05/30/2024   CO2 25 05/30/2024   BUN 29 (H) 05/30/2024   CREATININE 0.97 05/30/2024   GLUCOSE 143 (H) 05/30/2024    Discharge Medications:   Allergies as of 05/30/2024       Reactions   Morphine And Codeine Nausea And Vomiting, Nausea Only   Hallucinations   Sulfa Antibiotics Other (See Comments)   Dizzy/Fainting   Adhesive [tape] Rash, Other (See Comments)   Including Bandaids-paper tape ok to use   Erythromycin Nausea And Vomiting   Oxycodone  Nausea And Vomiting        Medication List     STOP taking these medications    aspirin  EC 81 MG tablet   naproxen  500 MG tablet Commonly known as: NAPROSYN        TAKE  these medications    acetaminophen  650 MG CR tablet Commonly known as: TYLENOL  Take 1,300 mg by mouth in the morning and at bedtime.   anastrozole  1 MG tablet Commonly known as: ARIMIDEX  TAKE 1 TABLET BY MOUTH DAILY   apixaban  2.5 MG Tabs tablet Commonly known as: ELIQUIS  Take 1 tablet (2.5 mg total) by mouth 2 (two) times daily.   atorvastatin  80 MG tablet Commonly known as: LIPITOR TAKE 1 TABLET BY MOUTH DAILY   CALCIUM  PO Take 1 tablet by mouth daily.   chlorhexidine  4 % external liquid Commonly known as: HIBICLENS  Apply 15 mLs (1 Application total) topically as directed for 30 doses. Use as directed daily for 5 days every other week for 6 weeks.   cholecalciferol  25 MCG (1000 UNIT) tablet Commonly known as: VITAMIN D3 Take 1,000 Units by mouth daily.   ezetimibe  10 MG tablet Commonly known as: ZETIA  TAKE 1 TABLET BY MOUTH DAILY   hydrochlorothiazide  12.5 MG tablet Commonly known as: HYDRODIURIL  TAKE ONE TABLET BY MOUTH EVERY DAY   HYDROcodone -acetaminophen  5-325 MG tablet Commonly known as: NORCO/VICODIN Take 1-2 tablets by mouth every 6 (six) hours as needed for moderate pain (pain score 4-6).   ICAPS LUTEIN  & ZEAXANTHIN PO Take 1 tablet by mouth every morning. Zeaxanthin 4mg /Lutein  10mg    ketoconazole  2 % cream Commonly known as: NIZORAL  Apply twice daily as directed. What changed:  how much to take when to take this reasons to take this   lansoprazole  30 MG capsule Commonly known as: PREVACID  TAKE 1 CAPSULE BY MOUTH ONCE DAILY   levothyroxine  88 MCG tablet Commonly known as: SYNTHROID  TAKE 1 TABLET BY MOUTH ONCE DAILY. TAKE ON EMPTY STOMACH WITH A GLASS OF WATER AT LEAST 30-60 MINUTES BEFORE BREAKFAST   lisinopril  40 MG tablet Commonly known as: ZESTRIL  Take 1 tablet (40 mg total) by mouth daily.   mometasone  0.1 % cream Commonly known as: ELOCON  APPLY TO ITCHY SPOTS ON BODY 1-2 TIMES DAILY UNTIL IMPROVED; AVOID FACE, GROIN,AND UNDERARMS What  changed:  how much to take how to take this when to take this reasons to take this   multivitamin tablet Take 1 tablet by mouth daily.   mupirocin  ointment 2 % Commonly known as: BACTROBAN   Place 1 Application into the nose 2 (two) times daily for 60 doses. Use as directed 2 times daily for 5 days every other week for 6 weeks.   ondansetron  4 MG tablet Commonly known as: ZOFRAN  Take 1 tablet (4 mg total) by mouth every 6 (six) hours as needed for nausea.   tacrolimus  0.1 % ointment Commonly known as: PROTOPIC  Apply 1-2 times daily as needed for itch to affected areas.               Durable Medical Equipment  (From admission, onward)           Start     Ordered   05/29/24 1447  DME 3 n 1  Once        05/29/24 1446   05/29/24 1447  DME Walker rolling  Once       Question Answer Comment  Walker: With 5 Inch Wheels   Patient needs a walker to treat with the following condition Status post total hip replacement, right      05/29/24 1446           Diagnostic Studies: DG HIP UNILAT W OR W/O PELVIS 2-3 VIEWS RIGHT Result Date: 05/29/2024 CLINICAL DATA:  Status post right total hip replacement. EXAM: DG HIP (WITH OR WITHOUT PELVIS) 2-3V RIGHT COMPARISON:  January 28, 2021. FINDINGS: Right acetabular and femoral components are well situated. Expected postoperative changes are seen in the surrounding soft tissues. IMPRESSION: Status post right total hip arthroplasty. Electronically Signed   By: Lynwood Landy Raddle M.D.   On: 05/29/2024 16:50   Disposition: Plan for discharge home today pending progress with PT.     Follow-up Information     Kip Lynwood Double, PA-C Follow up in 14 day(s).   Specialty: Physician Assistant Why: Orene removal. Contact information: [address] [place] [address] [phone]                Signed: Lynwood LITTIE Kip PA-C 05/30/2024, 7:05 AM

## 2024-05-30 NOTE — Evaluation (Signed)
 Physical Therapy Evaluation Patient Details Name: Bailey Brooks MRN: [id number] DOB: 01-02-1951 Today's Date: 05/30/2024  History of Present Illness  Pt is a 73 y.o. female who was admitted 05/29/2024 with a diagnosis of primary osteoarthritis of the right hip and underwent a RIGHT total hip replacement (POSTERIOR APPROACH).  Clinical Impression  Pt admitted with above diagnosis. Pt currently with functional limitations due to the deficits listed below (see PT Problem List). Pt received upright in recliner agreeable to PT services. PTA was indep with gait and ADL's. Was using SPC last 2 weeks before surgery due to R hip pain.   To date, reviewed education as listed below. Pt with good understanding of precautions from OT eval earlier. Requires CGA initially for STS efforts to RW due to R hip pain. Ambulates ~90' supervision with R antalgic gait but good stability on RLE in stance phase with light UE support on RW. Ambulates at a slowed cadence. Pt reports need for possible urination but unsuccessful and is able to stand at supervision. Transferred to recliner to transport for stair training. Reviewed safe practices depending on pt's entrance at home. Educated and encouraged front door due to rail set up despite more steps and further walk due to reduced falls risk. Reviewed side ways technique and use of family member for RW mgmt. Able to complete CGA to supervision. Pt returned to recliner in room. Encouraged pt for afternoon session with pt in agreement prior to d/c. Pt has needed DME. Just needs gait progression and stair training to ensure safe transition to listed venue. Pt with all needs in reach. Pt will benefit from skilled PT services to address acute deficits and reduce falls risk to maximize return to PLOF.       If plan is discharge home, recommend the following: A little help with walking and/or transfers;A little help with bathing/dressing/bathroom;Assist for transportation;Help with  stairs or ramp for entrance   Can travel by private vehicle        Equipment Recommendations None recommended by PT  Recommendations for Other Services       Functional Status Assessment Patient has had a recent decline in their functional status and demonstrates the ability to make significant improvements in function in a reasonable and predictable amount of time.     Precautions / Restrictions Precautions Precaution Booklet Issued: Yes (comment) Precaution/Restrictions Comments: POSTERIOR HIP PRECAUTIONS to RLE (no bending past 90 degrees, no crossing over knee, or ankle, no twisting in hips) - WBAT Restrictions Weight Bearing Restrictions Per Provider Order: Yes RLE Weight Bearing Per Provider Order: Weight bearing as tolerated      Mobility  Bed Mobility               General bed mobility comments: NT. In recliner pre and post session. Patient Response: Cooperative  Transfers Overall transfer level: Needs assistance Equipment used: Rolling walker (2 wheels) Transfers: Sit to/from Stand Sit to Stand: Contact guard assist, Supervision           General transfer comment: first STS CGA from recliner. VC's for hand placement. Supervision for rest of STS throughout session    Ambulation/Gait Ambulation/Gait assistance: Supervision Gait Distance (Feet): 90 Feet Assistive device: Rolling walker (2 wheels) Gait Pattern/deviations: Step-to pattern, Antalgic       General Gait Details: slowed antalgic pattern on RLE. x2 standing rest breaks due to R hip pain but light UE support only on RW. Safe use of device. LImited in distance due to pain.  Stairs Stairs: Yes Stairs assistance: Contact guard assist Stair Management: One rail Right, Step to pattern, Sideways Number of Stairs: 4 General stair comments: PT demo prior to completion. Heavy UE support on railing needed to complete. Discussed safer option to enter front of home than garage entrance. Disucesed use  of RW to enter threshold step from deck into home.  Wheelchair Mobility     Tilt Bed Tilt Bed Patient Response: Cooperative  Modified Rankin (Stroke Patients Only)       Balance Overall balance assessment: Needs assistance Sitting-balance support: Single extremity supported Sitting balance-Leahy Scale: Good     Standing balance support: Bilateral upper extremity supported Standing balance-Leahy Scale: Fair Standing balance comment: light UE support on RW                             Pertinent Vitals/Pain Pain Assessment Pain Assessment: Faces Faces Pain Scale: Hurts little more Pain Location: R hip Pain Descriptors / Indicators: Throbbing Pain Intervention(s): Limited activity within patient's tolerance, Monitored during session, Repositioned    Home Living Family/patient expects to be discharged to:: Private residence Living Arrangements: Spouse/significant other Available Help at Discharge: Family;Available 24 hours/day (husband. Son staying 1 week with patient.) Type of Home: House Home Access: Stairs to enter Entrance Stairs-Rails: Right Entrance Stairs-Number of Steps: 3 STE through garage R railing only for first 2 steps, 5 STE from front with railings   Home Layout: One level Home Equipment: Shower seat - built in;Toilet riser;Rolling Walker (2 wheels);Cane - single point      Prior Function Prior Level of Function : Independent/Modified Independent             Mobility Comments: Independent at PLOF ADLs Comments: Independent with ADLs/IADLs     Extremity/Trunk Assessment   Upper Extremity Assessment Upper Extremity Assessment: Generalized weakness    Lower Extremity Assessment Lower Extremity Assessment: Generalized weakness;RLE deficits/detail RLE Deficits / Details: expected ROM, pain, and strength deficits post op    Cervical / Trunk Assessment Cervical / Trunk Assessment: Normal  Communication   Communication Communication:  No apparent difficulties    Cognition Arousal: Alert Behavior During Therapy: WFL for tasks assessed/performed   PT - Cognitive impairments: No apparent impairments                         Following commands: Intact       Cueing Cueing Techniques: Verbal cues     General Comments      Exercises Total Joint Exercises Ankle Circles/Pumps: AROM, Strengthening, Both, 5 reps, Supine Quad Sets: AROM, Strengthening, Right, 5 reps, Supine Gluteal Sets: AROM, Both, 10 reps, Supine Short Arc Quad: AROM, Strengthening, Right, 5 reps, Supine Heel Slides: AROM, Strengthening, Right, 10 reps, Supine Hip ABduction/ADduction: AROM, Strengthening, Right, 5 reps, Supine Other Exercises Other Exercises: post hip precautions, WB status, HEP hand out (reps/sets/frequency), stair training   Assessment/Plan    PT Assessment Patient needs continued PT services  PT Problem List Decreased strength;Decreased mobility;Decreased activity tolerance;Decreased balance;Pain       PT Treatment Interventions DME instruction;Therapeutic exercise;Gait training;Balance training;Stair training;Neuromuscular re-education;Functional mobility training;Therapeutic activities;Patient/family education    PT Goals (Current goals can be found in the Care Plan section)  Acute Rehab PT Goals Patient Stated Goal: to go home, improve R hip pain PT Goal Formulation: With patient Time For Goal Achievement: 06/13/24 Potential to Achieve Goals: Good    Frequency BID  Co-evaluation               AM-PAC PT 6 Clicks Mobility  Outcome Measure Help needed turning from your back to your side while in a flat bed without using bedrails?: A Lot Help needed moving from lying on your back to sitting on the side of a flat bed without using bedrails?: A Lot Help needed moving to and from a bed to a chair (including a wheelchair)?: A Little Help needed standing up from a chair using your arms (e.g.,  wheelchair or bedside chair)?: A Little Help needed to walk in hospital room?: A Little Help needed climbing 3-5 steps with a railing? : A Little 6 Click Score: 16    End of Session Equipment Utilized During Treatment: Gait belt Activity Tolerance: Patient tolerated treatment well Patient left: in chair;with call bell/phone within reach;with chair alarm set Nurse Communication: Mobility status PT Visit Diagnosis: Other abnormalities of gait and mobility (R26.89);Muscle weakness (generalized) (M62.81);Difficulty in walking, not elsewhere classified (R26.2);Pain Pain - Right/Left: Right Pain - part of body: Hip    Time: 0925-1020 PT Time Calculation (min) (ACUTE ONLY): 55 min   Charges:   PT Evaluation $PT Eval Low Complexity: 1 Low PT Treatments $Gait Training: 23-37 mins $Therapeutic Exercise: 8-22 mins PT General Charges $$ ACUTE PT VISIT: 1 Visit        Dorina HERO. Fairly IV, PT, DPT Physical Therapist- [place] [facility] [place]  [facility] 05/30/2024, 10:50 AM

## 2024-05-30 NOTE — Discharge Instructions (Addendum)
 Instructions after Total Hip Replacement     Bailey Brooks, M.D.  Bailey Gustavo Level, PA-C     Dept. of Orthopaedics & Sports Medicine  [facility]  [address]  [place], [place]  [address]  Phone: [phone]   Fax: [phone]    DIET: Drink plenty of non-alcoholic fluids. Resume your normal diet. Include foods high in fiber.  ACTIVITY:  You may use crutches or a walker with weight-bearing as tolerated, unless instructed otherwise. You may be weaned off of the walker or crutches by your Physical Therapist.  Do NOT reach below the Brooks of your knees or cross your legs until allowed.    Continue doing gentle exercises. Exercising will reduce the pain and swelling, increase motion, and prevent muscle weakness.   Please continue to use the TED compression stockings for 6 weeks. You may remove the stockings at night, but should reapply them in the morning. Do not drive or operate any equipment until instructed.  WOUND CARE:  Continue to use ice packs periodically to reduce pain and swelling. Keep the incision clean and dry. You may bathe or shower after the staples are removed at the first office visit following surgery.  MEDICATIONS: You may resume your regular medications. Please take the pain medication as prescribed on the medication. Do not take pain medication on an empty stomach. You have been given a prescription for a blood thinner to prevent blood clots. Please take the medication as instructed. (NOTE: After completing a 2 week course of Eliquis , take one Enteric-coated aspirin  twice a day.) Pain medications and iron supplements can cause constipation. Use a stool softener (Senokot or Colace) on a daily basis and a laxative (dulcolax or miralax) as needed. Do not drive or drink alcoholic beverages when taking pain medications.  CALL THE OFFICE FOR: Temperature above 101 degrees Excessive bleeding or drainage on the dressing. Excessive swelling, coldness, or  paleness of the toes. Persistent nausea and vomiting.  FOLLOW-UP:  You should have an appointment to return to the office in 2 weeks after surgery. Arrangements have been made for continuation of Physical Therapy (either home therapy or outpatient therapy).  [facility].  [address] , [place], [place], [address]. [phone] They will call you to arrange when they can come to see you

## 2024-05-30 NOTE — Evaluation (Signed)
 Occupational Therapy Evaluation Patient Details Name: Bailey Brooks MRN: [id number] DOB: 05-Jan-1951 Today's Date: 05/30/2024   History of Present Illness   Pt is a 73 y.o. female who was admitted 05/29/2024 with a diagnosis of primary osteoarthritis of the right hip and underwent a RIGHT total hip replacement (POSTERIOR APPROACH).     Clinical Impressions Pt seen for OT evaluation this date, POD#2 from above surgery. Pt was Modified independent in all ADLs prior to surgery, however occasionally using tongs to assist with LB ADLs and due to hip pain and limited mobility/flexibility in affected LE. Pt is eager to return to PLOF with less pain and improved safety and independence. Pt currently requires Max  assist for LB dressing while in seated position due to pain, weakness, and limited AROM of Right hip along with requiring assistance for adherence to Posterior Hip Precautions . Pt able to recall 0/3 posterior total hip precautions at start of session and unable to verbalize how to implement during ADL and mobility. Pt instructed in posterior total hip precautions and how to implement, self care skills, falls prevention strategies, home/routines modifications, DME/AE for LB bathing and dressing tasks. At end of session, pt able to recall 2/3 posterior total hip precautions. Pt would benefit from additional instruction in self care skills and techniques to help maintain precautions with or without assistive devices to support recall and carryover prior to discharge.        If plan is discharge home, recommend the following:   A lot of help with bathing/dressing/bathroom;A lot of help with walking and/or transfers;Help with stairs or ramp for entrance;Assistance with cooking/housework;Assist for transportation     Functional Status Assessment   Patient has had a recent decline in their functional status and demonstrates the ability to make significant improvements in function in a reasonable  and predictable amount of time.     Equipment Recommendations   BSC/3in1;Toilet rise with handles, grab bars in shower and next to commode     Recommendations for Other Services         Precautions/Restrictions   Precautions Precautions: Posterior Hip Precaution Booklet Issued: No (Verbal education and demonstration of Posterior Hip Precautions.  Unable to locate handout for posterior hip precautions, request defer to PT for handout if possible.) Precaution/Restrictions Comments: POSTERIOR HIP PRECAUTIONS to RLE (no bending past 90 degrees, no crossing over knee, or ankle, no twisting in hips) - WBAT Restrictions Weight Bearing Restrictions Per Provider Order: No Other Position/Activity Restrictions: WBAT     Mobility Bed Mobility Overal bed mobility: Needs Assistance Bed Mobility: Supine to Sit     Supine to sit: Mod assist          Transfers Overall transfer level: Needs assistance Equipment used: Rolling walker (2 wheels) Transfers: Sit to/from Stand, Bed to chair/wheelchair/BSC Sit to Stand: Mod assist     Step pivot transfers: Contact guard assist            Balance Overall balance assessment: Needs assistance Sitting-balance support: Single extremity supported Sitting balance-Leahy Scale: Good     Standing balance support: Bilateral upper extremity supported Standing balance-Leahy Scale: Poor                             ADL either performed or assessed with clinical judgement   ADL Overall ADL's : Needs assistance/impaired Eating/Feeding: Set up  Lower Body Dressing: Maximal assistance Lower Body Dressing Details (indicate cue type and reason): posterior hip precautions - education on use and benefits of AE for LB ADLs - patient reports she typically uses tongs for LB dressing tasks Toilet Transfer: Minimal assistance Toilet Transfer Details (indicate cue type and reason): verbal, tactile, visual to  ensure maintaining posterior hip precautions Toileting- Clothing Manipulation and Hygiene: Maximal assistance       Functional mobility during ADLs: Minimal assistance General ADL Comments: WBAT to RLE     Vision Baseline Vision/History: 0 No visual deficits Patient Visual Report: No change from baseline       Perception         Praxis         Pertinent Vitals/Pain Pain Assessment Pain Assessment: 0-10 Pain Score: 2  Pain Location: R hip Pain Descriptors / Indicators: Throbbing Pain Intervention(s): RN gave pain meds during session, Monitored during session     Extremity/Trunk Assessment Upper Extremity Assessment Upper Extremity Assessment: Generalized weakness;Right hand dominant (BUE 3+/5)   Lower Extremity Assessment Lower Extremity Assessment: Defer to PT evaluation       Communication Communication Communication: No apparent difficulties   Cognition Arousal: Alert Behavior During Therapy: WFL for tasks assessed/performed Cognition: No apparent impairments                               Following commands: Intact       Cueing  General Comments   Cueing Techniques: Verbal cues;Tactile cues;Visual cues (Demonstration)      Exercises Other Exercises Other Exercises: Education on posterior hip precautions during ADLs and ADL transfers.   Shoulder Instructions      Home Living Family/patient expects to be discharged to:: Private residence Living Arrangements: Spouse/significant other Available Help at Discharge: Family;Available 24 hours/day Type of Home: House Home Access: Stairs to enter [employer] of Steps: 3 STE through garage, 5 STE from front with railings Entrance Stairs-Rails: Right Home Layout: One level     Bathroom Shower/Tub: Producer, television/film/video: Handicapped height Bathroom Accessibility: Yes How Accessible: Accessible via walker Home Equipment: Shower seat - built in;Toilet riser           Prior Functioning/Environment Prior Level of Function : Independent/Modified Independent             Mobility Comments: Independent at PLOF ADLs Comments: Independent with ADLs/IADLs    OT Problem List: Decreased strength;Decreased coordination;Pain;Decreased activity tolerance;Decreased safety awareness;Impaired balance (sitting and/or standing);Decreased knowledge of precautions;Decreased knowledge of use of DME or AE   OT Treatment/Interventions: Self-care/ADL training;Therapeutic activities;DME and/or AE instruction;Patient/family education;Balance training      OT Goals(Current goals can be found in the care plan section)   Acute Rehab OT Goals Patient Stated Goal: return home OT Goal Formulation: With patient Time For Goal Achievement: 06/13/24 Potential to Achieve Goals: Good ADL Goals Pt Will Perform Lower Body Bathing: with supervision;with adaptive equipment Pt Will Perform Lower Body Dressing: with supervision;with adaptive equipment Pt Will Transfer to Toilet: with supervision;bedside commode;stand pivot transfer Pt Will Perform Toileting - Clothing Manipulation and hygiene: with supervision;with adaptive equipment   OT Frequency:  Min 2X/week    Co-evaluation              AM-PAC OT 6 Clicks Daily Activity     Outcome Measure Help from another person eating meals?: None Help from another person taking care of personal grooming?: None Help  from another person toileting, which includes using toliet, bedpan, or urinal?: A Lot Help from another person bathing (including washing, rinsing, drying)?: A Lot Help from another person to put on and taking off regular upper body clothing?: A Little Help from another person to put on and taking off regular lower body clothing?: A Lot 6 Click Score: 17   End of Session Equipment Utilized During Treatment: Gait belt;Rolling walker (2 wheels) Nurse Communication: Precautions;Weight bearing status  Activity  Tolerance: Patient tolerated treatment well;Patient limited by fatigue Patient left: in chair;with call bell/phone within reach;with chair alarm set  OT Visit Diagnosis: Unsteadiness on feet (R26.81);Muscle weakness (generalized) (M62.81)                Time: 9161-9084 OT Time Calculation (min): 37 min Charges:  OT General Charges $OT Visit: 1 Visit OT Evaluation $OT Eval Moderate Complexity: 1 Mod  Aeriana Speece OTR/L   Harlene LITTIE Sharps 05/30/2024, 10:04 AM

## 2024-05-30 NOTE — Telephone Encounter (Signed)
 Patient Product/process development scientist completed.    The patient is insured through [facility]. Patient has Medicare and is not eligible for a copay card, but may be able to apply for patient assistance or Medicare RX Payment Plan (Patient Must reach out to their plan, if eligible for payment plan), if available.    Ran test claim for Eliquis  2.5 mg and the current 15 day co-pay is $304.68 due to a deductible.   This test claim was processed through [facility]- copay amounts may vary at other pharmacies due to pharmacy/plan contracts, or as the patient moves through the different stages of their insurance plan.     Reyes Sharps, CPHT Pharmacy Technician III Certified Patient Advocate [facility] Patient Advocate Team Direct Number: [phone]  Fax: [phone]

## 2024-05-30 NOTE — Progress Notes (Signed)
 Subjective: 1 Day Post-Op Procedure(s) (LRB): ARTHROPLASTY, HIP, TOTAL,POSTERIOR APPROACH (Right) Patient reports pain as mild.   Patient is well, and has had no acute complaints or problems Plan is to go Home after hospital stay. Negative for chest pain and shortness of breath Fever: no Gastrointestinal:Negative for nausea and vomiting this AM, did have some nausea yesterday.  Objective: Vital signs in last 24 hours: Temp:  [97 F (36.1 C)-99.2 F (37.3 C)] 99.2 F (37.3 C) (06/27 0312) Pulse Rate:  [47-91] 66 (06/27 0312) Resp:  [7-25] 20 (06/27 0312) BP: (99-167)/(42-80) 139/63 (06/27 0312) SpO2:  [91 %-100 %] 95 % (06/27 0312) Weight:  [109.9 kg] 109.9 kg (06/26 0810)  Intake/Output from previous day:  Intake/Output Summary (Last 24 hours) at 05/30/2024 0700 Last data filed at 05/30/2024 0453 Gross per 24 hour  Intake 2849.78 ml  Output 350 ml  Net 2499.78 ml    Intake/Output this shift: Total I/O In: 929.8 [I.V.:929.8] Out: 300 [Urine:300]  Labs: Recent Labs    05/30/24 0429  HGB 10.9*   Recent Labs    05/30/24 0429  WBC 11.8*  RBC 3.79*  HCT 33.4*  PLT 197   Recent Labs    05/30/24 0429  NA 139  K 4.2  CL 106  CO2 25  BUN 29*  CREATININE 0.97  GLUCOSE 143*  CALCIUM  8.7*   No results for input(s): LABPT, INR in the last 72 hours.   EXAM General - Patient is Alert, Appropriate, and Disorganized Extremity - Neurologically intact ABD soft Neurovascular intact Dorsiflexion/Plantar flexion intact Incision: Prevena intact No cellulitis present Compartment soft Dressing/Incision - Prevena intact without any drainage into the cannister. Motor Function - intact, moving foot and toes well on exam.  Abdomen soft with intact bowel sounds.  Past Medical History:  Diagnosis Date   Adult pulmonary Langerhans cell histiocytosis (HCC)    Eosinophilic Granuloma of the Lung)   Allergic rhinitis    Antineoplastic chemotherapy induced anemia     Arthritis    Breast cancer, right (HCC) 09/17/2019   a.) Bx (+) stage 1 IMC (G1, ER/PR +, Her2/neu -); pT1a N0 M0; Tx'd with adjuvant XRT + endocrine therapy   Bruising 12/14/2017   Complication of anesthesia    pt was awake during intubation for colonoscopy   Diastolic dysfunction    a.) TTE 11/14/2016: EF 60-65%, no rwma, mild LVH, triv MR, mildly dil LA, nl RVSF, G1DD; b. 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   Tubular adenoma of colon     Assessment/Plan: 1 Day Post-Op Procedure(s) (LRB): ARTHROPLASTY, HIP, TOTAL,POSTERIOR APPROACH  (Right) Principal Problem:   Status post total hip replacement, right  Estimated body mass index is 42.9 kg/m as calculated from the following:   Height as of this encounter: 5' 3 (1.6 m).   Weight as of this encounter: 109.9 kg. Advance diet Up with therapy D/C IV fluids when tolerating po intake.  Labs and vitals reviewed, Hg 10.9, WBC 11.8. Continue to work on a BM. Up with PT today, does have 3-5 steps to enter house.  Son is coming to town to assist. If prevena dry, can switch to honeycomb dressing prior to discharge. Plan for possible d/c home today with HHPT pending progress with PT.  DVT Prophylaxis - TED hose and Eliquis  Weight-Bearing as tolerated to right leg  J. Gustavo Level, PA-C [facility] Orthopaedic Surgery 05/30/2024, 7:00 AM

## 2024-05-31 ENCOUNTER — Other Ambulatory Visit: Payer: Self-pay

## 2024-05-31 ENCOUNTER — Emergency Department
Admission: EM | Admit: 2024-05-31 | Discharge: 2024-05-31 | Disposition: A | Discharge status: Home / Self Care | Attending: Emergency Medicine | Admitting: Emergency Medicine

## 2024-05-31 DIAGNOSIS — D72829 Elevated white blood cell count, unspecified: Secondary | ICD-10-CM | POA: Diagnosis not present

## 2024-05-31 DIAGNOSIS — I129 Hypertensive chronic kidney disease with stage 1 through stage 4 chronic kidney disease, or unspecified chronic kidney disease: Secondary | ICD-10-CM | POA: Diagnosis not present

## 2024-05-31 DIAGNOSIS — M51379 Other intervertebral disc degeneration, lumbosacral region without mention of lumbar back pain or lower extremity pain: Secondary | ICD-10-CM | POA: Diagnosis not present

## 2024-05-31 DIAGNOSIS — K219 Gastro-esophageal reflux disease without esophagitis: Secondary | ICD-10-CM | POA: Diagnosis not present

## 2024-05-31 DIAGNOSIS — G4710 Hypersomnia, unspecified: Secondary | ICD-10-CM | POA: Diagnosis not present

## 2024-05-31 DIAGNOSIS — Z8673 Personal history of transient ischemic attack (TIA), and cerebral infarction without residual deficits: Secondary | ICD-10-CM | POA: Diagnosis not present

## 2024-05-31 DIAGNOSIS — G894 Chronic pain syndrome: Secondary | ICD-10-CM | POA: Diagnosis not present

## 2024-05-31 DIAGNOSIS — R339 Retention of urine, unspecified: Secondary | ICD-10-CM | POA: Insufficient documentation

## 2024-05-31 DIAGNOSIS — E7800 Pure hypercholesterolemia, unspecified: Secondary | ICD-10-CM | POA: Diagnosis not present

## 2024-05-31 DIAGNOSIS — Z743 Need for continuous supervision: Secondary | ICD-10-CM | POA: Diagnosis not present

## 2024-05-31 DIAGNOSIS — R300 Dysuria: Secondary | ICD-10-CM | POA: Diagnosis not present

## 2024-05-31 DIAGNOSIS — Z96641 Presence of right artificial hip joint: Secondary | ICD-10-CM | POA: Diagnosis not present

## 2024-05-31 DIAGNOSIS — M1612 Unilateral primary osteoarthritis, left hip: Secondary | ICD-10-CM | POA: Diagnosis not present

## 2024-05-31 DIAGNOSIS — R7303 Prediabetes: Secondary | ICD-10-CM | POA: Diagnosis not present

## 2024-05-31 DIAGNOSIS — H3530 Unspecified macular degeneration: Secondary | ICD-10-CM | POA: Diagnosis not present

## 2024-05-31 DIAGNOSIS — E039 Hypothyroidism, unspecified: Secondary | ICD-10-CM | POA: Diagnosis not present

## 2024-05-31 DIAGNOSIS — K5790 Diverticulosis of intestine, part unspecified, without perforation or abscess without bleeding: Secondary | ICD-10-CM | POA: Diagnosis not present

## 2024-05-31 DIAGNOSIS — K449 Diaphragmatic hernia without obstruction or gangrene: Secondary | ICD-10-CM | POA: Diagnosis not present

## 2024-05-31 DIAGNOSIS — Z7901 Long term (current) use of anticoagulants: Secondary | ICD-10-CM | POA: Diagnosis not present

## 2024-05-31 DIAGNOSIS — M47817 Spondylosis without myelopathy or radiculopathy, lumbosacral region: Secondary | ICD-10-CM | POA: Diagnosis not present

## 2024-05-31 DIAGNOSIS — R609 Edema, unspecified: Secondary | ICD-10-CM | POA: Diagnosis not present

## 2024-05-31 DIAGNOSIS — M48062 Spinal stenosis, lumbar region with neurogenic claudication: Secondary | ICD-10-CM | POA: Diagnosis not present

## 2024-05-31 DIAGNOSIS — Z471 Aftercare following joint replacement surgery: Secondary | ICD-10-CM | POA: Diagnosis not present

## 2024-05-31 DIAGNOSIS — N182 Chronic kidney disease, stage 2 (mild): Secondary | ICD-10-CM | POA: Diagnosis not present

## 2024-05-31 LAB — COMPREHENSIVE METABOLIC PANEL WITH GFR
ALT: 17 U/L (ref 0–44)
AST: 59 U/L — ABNORMAL HIGH (ref 15–41)
Albumin: 3.2 g/dL — ABNORMAL LOW (ref 3.5–5.0)
Alkaline Phosphatase: 70 U/L (ref 38–126)
Anion gap: 7 (ref 5–15)
BUN: 32 mg/dL — ABNORMAL HIGH (ref 8–23)
CO2: 25 mmol/L (ref 22–32)
Calcium: 9 mg/dL (ref 8.9–10.3)
Chloride: 106 mmol/L (ref 98–111)
Creatinine, Ser: 1.11 mg/dL — ABNORMAL HIGH (ref 0.44–1.00)
GFR, Estimated: 52 mL/min — ABNORMAL LOW (ref >60)
Glucose, Bld: 141 mg/dL — ABNORMAL HIGH (ref 70–99)
Potassium: 4.2 mmol/L (ref 3.5–5.1)
Sodium: 138 mmol/L (ref 135–145)
Total Bilirubin: 0.5 mg/dL (ref 0.0–1.2)
Total Protein: 6.7 g/dL (ref 6.5–8.1)

## 2024-05-31 LAB — CBC
HCT: 33.2 % — ABNORMAL LOW (ref 36.0–46.0)
Hemoglobin: 10.7 g/dL — ABNORMAL LOW (ref 12.0–15.0)
MCH: 28.9 pg (ref 26.0–34.0)
MCHC: 32.2 g/dL (ref 30.0–36.0)
MCV: 89.7 fL (ref 80.0–100.0)
Platelets: 205 10*3/uL (ref 150–400)
RBC: 3.7 MIL/uL — ABNORMAL LOW (ref 3.87–5.11)
RDW: 14.5 % (ref 11.5–15.5)
WBC: 12.6 10*3/uL — ABNORMAL HIGH (ref 4.0–10.5)
nRBC: 0 % (ref 0.0–0.2)

## 2024-05-31 LAB — URINALYSIS, ROUTINE W REFLEX MICROSCOPIC
Bacteria, UA: NONE SEEN
Bilirubin Urine: NEGATIVE
Glucose, UA: 50 mg/dL — AB
Ketones, ur: NEGATIVE mg/dL
Leukocytes,Ua: NEGATIVE
Nitrite: NEGATIVE
Protein, ur: NEGATIVE mg/dL
Specific Gravity, Urine: 1.018 (ref 1.005–1.030)
Squamous Epithelial / HPF: 0 /HPF (ref 0–5)
pH: 5 (ref 5.0–8.0)

## 2024-05-31 NOTE — ED Notes (Signed)
 Called Supply chain. No answer. Charge RN called AC. AC communicated to RN that transport would be delivering leg bag to patient.

## 2024-05-31 NOTE — ED Triage Notes (Signed)
 Recently had hip replacement on Thursday 05/29/2024. Patient was able to urinate until this morning. Has had burning with urination since the surgery. Does not know if hospital cath her during her surgery. Last urination was 0700 05/31/2024. Patient has severe lower abdominal pain. Patient took two Vicodin before EMS arrived. Morphine and codeine allergy. Patient took BP medications about an hour ago. Labs were done a few days ago and BUN was high, EBC was High, RBCs high. Hx of skin cancer and has had surgery's resulting in Multiple vaginal problems stated by patient.   EMS VS: 98.7, 88, 172/98, 96%, CBG: 155   98.8

## 2024-05-31 NOTE — ED Notes (Signed)
>  urinary retention per bladder scanner.

## 2024-05-31 NOTE — ED Provider Notes (Signed)
 [facility] Provider Note    Event Date/Time   First MD Initiated Contact with Patient 05/31/24 1513     (approximate)   History   Urinary Retention (Patient unable to urinate)   HPI  Bailey Brooks is a 73 y.o. female who presents to the emergency department today because of concerns for urinary retention.  Last urinated around 7:00 this morning.  Did undergo hip surgery 2 days ago and was discharged from the hospital yesterday.  After discharge she was able to urinate but did have difficulty initiating the stream.  She also had discomfort.  She denies similar symptoms in the past.  Also complaining of significant lower abdominal pain.     Physical Exam   Triage Vital Signs: ED Triage Vitals  Encounter Vitals Group     BP 05/31/24 1434 (!) 142/69     Girls Systolic BP Percentile --      Girls Diastolic BP Percentile --      Boys Systolic BP Percentile --      Boys Diastolic BP Percentile --      Pulse Rate 05/31/24 1434 74     Resp 05/31/24 1434 12     Temp 05/31/24 1434 98.8 F (37.1 C)     Temp Source 05/31/24 1434 Oral     SpO2 05/31/24 1434 100 %     Weight 05/31/24 1435 244 lb (110.7 kg)     Height 05/31/24 1435 5' 3 (1.6 m)     Head Circumference --      Peak Flow --      Pain Score 05/31/24 1435 8     Pain Loc --      Pain Education --      Exclude from Growth Chart --     Most recent vital signs: Vitals:   05/31/24 1434 05/31/24 1500  BP: (!) 142/69 (!) 138/51  Pulse: 74 73  Resp: 12 12  Temp: 98.8 F (37.1 C)   SpO2: 100% 100%   General: Awake, alert, oriented. CV:  Good peripheral perfusion.  Resp:  Normal effort.  Abd:  No distention.    ED Results / Procedures / Treatments   Labs (all labs ordered are listed, but only abnormal results are displayed) Labs Reviewed  COMPREHENSIVE METABOLIC PANEL WITH GFR - Abnormal; Notable for the following components:      Result Value   Glucose, Bld 141 (*)    BUN 32 (*)     Creatinine, Ser 1.11 (*)    Albumin 3.2 (*)    AST 59 (*)    GFR, Estimated 52 (*)    All other components within normal limits  CBC - Abnormal; Notable for the following components:   WBC 12.6 (*)    RBC 3.70 (*)    Hemoglobin 10.7 (*)    HCT 33.2 (*)    All other components within normal limits  URINALYSIS, ROUTINE W REFLEX MICROSCOPIC - Abnormal; Notable for the following components:   Color, Urine YELLOW (*)    APPearance CLEAR (*)    Glucose, UA 50 (*)    Hgb urine dipstick SMALL (*)    All other components within normal limits  CBG MONITORING, ED     EKG  None   RADIOLOGY None   PROCEDURES:  Critical Care performed: No    MEDICATIONS ORDERED IN ED: Medications - No data to display   IMPRESSION / MDM / ASSESSMENT AND PLAN / ED COURSE  I  reviewed the triage vital signs and the nursing notes.                              Differential diagnosis includes, but is not limited to, UTI, acute retention secondary to recent surgery/anesthesia  Patient's presentation is most consistent with acute presentation with potential threat to life or bodily function.  Patient presented to the emergency department today because of concerns for urinary retention. Had hip surgery 2 days ago. Patient did get significant relief after catheter placement. UA without findings concerning for infection. No AKI. Do think retention likely secondary to recent procedure. Discussed this with the patient. Will discharge with catheter and leg bag, will give urology follow up information.   FINAL CLINICAL IMPRESSION(S) / ED DIAGNOSES   Final diagnoses:  Urinary retention       Note:  This document was prepared using Dragon voice recognition software and may include unintentional dictation errors.    Floy Roberts, MD 05/31/24 [phone]

## 2024-06-02 ENCOUNTER — Ambulatory Visit

## 2024-06-03 ENCOUNTER — Other Ambulatory Visit: Payer: Self-pay

## 2024-06-03 DIAGNOSIS — Z471 Aftercare following joint replacement surgery: Secondary | ICD-10-CM | POA: Diagnosis not present

## 2024-06-03 DIAGNOSIS — K5790 Diverticulosis of intestine, part unspecified, without perforation or abscess without bleeding: Secondary | ICD-10-CM | POA: Diagnosis not present

## 2024-06-03 DIAGNOSIS — N182 Chronic kidney disease, stage 2 (mild): Secondary | ICD-10-CM | POA: Diagnosis not present

## 2024-06-03 DIAGNOSIS — G4710 Hypersomnia, unspecified: Secondary | ICD-10-CM | POA: Diagnosis not present

## 2024-06-03 DIAGNOSIS — G894 Chronic pain syndrome: Secondary | ICD-10-CM | POA: Diagnosis not present

## 2024-06-03 DIAGNOSIS — Z7901 Long term (current) use of anticoagulants: Secondary | ICD-10-CM | POA: Diagnosis not present

## 2024-06-03 DIAGNOSIS — Z96641 Presence of right artificial hip joint: Secondary | ICD-10-CM | POA: Diagnosis not present

## 2024-06-03 DIAGNOSIS — E7800 Pure hypercholesterolemia, unspecified: Secondary | ICD-10-CM | POA: Diagnosis not present

## 2024-06-03 DIAGNOSIS — K219 Gastro-esophageal reflux disease without esophagitis: Secondary | ICD-10-CM | POA: Diagnosis not present

## 2024-06-03 DIAGNOSIS — H3530 Unspecified macular degeneration: Secondary | ICD-10-CM | POA: Diagnosis not present

## 2024-06-03 DIAGNOSIS — R7303 Prediabetes: Secondary | ICD-10-CM | POA: Diagnosis not present

## 2024-06-03 DIAGNOSIS — M51379 Other intervertebral disc degeneration, lumbosacral region without mention of lumbar back pain or lower extremity pain: Secondary | ICD-10-CM | POA: Diagnosis not present

## 2024-06-03 DIAGNOSIS — R339 Retention of urine, unspecified: Secondary | ICD-10-CM | POA: Diagnosis not present

## 2024-06-03 DIAGNOSIS — E039 Hypothyroidism, unspecified: Secondary | ICD-10-CM | POA: Diagnosis not present

## 2024-06-03 DIAGNOSIS — I10 Essential (primary) hypertension: Secondary | ICD-10-CM

## 2024-06-03 DIAGNOSIS — M1612 Unilateral primary osteoarthritis, left hip: Secondary | ICD-10-CM | POA: Diagnosis not present

## 2024-06-03 DIAGNOSIS — K449 Diaphragmatic hernia without obstruction or gangrene: Secondary | ICD-10-CM | POA: Diagnosis not present

## 2024-06-03 DIAGNOSIS — M48062 Spinal stenosis, lumbar region with neurogenic claudication: Secondary | ICD-10-CM | POA: Diagnosis not present

## 2024-06-03 DIAGNOSIS — Z8673 Personal history of transient ischemic attack (TIA), and cerebral infarction without residual deficits: Secondary | ICD-10-CM | POA: Diagnosis not present

## 2024-06-03 DIAGNOSIS — M47817 Spondylosis without myelopathy or radiculopathy, lumbosacral region: Secondary | ICD-10-CM | POA: Diagnosis not present

## 2024-06-03 DIAGNOSIS — I129 Hypertensive chronic kidney disease with stage 1 through stage 4 chronic kidney disease, or unspecified chronic kidney disease: Secondary | ICD-10-CM | POA: Diagnosis not present

## 2024-06-03 MED ORDER — HYDROCHLOROTHIAZIDE 12.5 MG PO TABS: 12.5000 mg | ORAL_TABLET | Freq: Every day | ORAL | 0 refills | Status: DC

## 2024-06-05 DIAGNOSIS — Z8673 Personal history of transient ischemic attack (TIA), and cerebral infarction without residual deficits: Secondary | ICD-10-CM | POA: Diagnosis not present

## 2024-06-05 DIAGNOSIS — G4710 Hypersomnia, unspecified: Secondary | ICD-10-CM | POA: Diagnosis not present

## 2024-06-05 DIAGNOSIS — Z471 Aftercare following joint replacement surgery: Secondary | ICD-10-CM | POA: Diagnosis not present

## 2024-06-05 DIAGNOSIS — H3530 Unspecified macular degeneration: Secondary | ICD-10-CM | POA: Diagnosis not present

## 2024-06-05 DIAGNOSIS — M47817 Spondylosis without myelopathy or radiculopathy, lumbosacral region: Secondary | ICD-10-CM | POA: Diagnosis not present

## 2024-06-05 DIAGNOSIS — M1612 Unilateral primary osteoarthritis, left hip: Secondary | ICD-10-CM | POA: Diagnosis not present

## 2024-06-05 DIAGNOSIS — G894 Chronic pain syndrome: Secondary | ICD-10-CM | POA: Diagnosis not present

## 2024-06-05 DIAGNOSIS — R339 Retention of urine, unspecified: Secondary | ICD-10-CM | POA: Diagnosis not present

## 2024-06-05 DIAGNOSIS — M51379 Other intervertebral disc degeneration, lumbosacral region without mention of lumbar back pain or lower extremity pain: Secondary | ICD-10-CM | POA: Diagnosis not present

## 2024-06-05 DIAGNOSIS — N182 Chronic kidney disease, stage 2 (mild): Secondary | ICD-10-CM | POA: Diagnosis not present

## 2024-06-05 DIAGNOSIS — K219 Gastro-esophageal reflux disease without esophagitis: Secondary | ICD-10-CM | POA: Diagnosis not present

## 2024-06-05 DIAGNOSIS — E039 Hypothyroidism, unspecified: Secondary | ICD-10-CM | POA: Diagnosis not present

## 2024-06-05 DIAGNOSIS — Z96641 Presence of right artificial hip joint: Secondary | ICD-10-CM | POA: Diagnosis not present

## 2024-06-05 DIAGNOSIS — K5790 Diverticulosis of intestine, part unspecified, without perforation or abscess without bleeding: Secondary | ICD-10-CM | POA: Diagnosis not present

## 2024-06-05 DIAGNOSIS — E7800 Pure hypercholesterolemia, unspecified: Secondary | ICD-10-CM | POA: Diagnosis not present

## 2024-06-05 DIAGNOSIS — Z7901 Long term (current) use of anticoagulants: Secondary | ICD-10-CM | POA: Diagnosis not present

## 2024-06-05 DIAGNOSIS — I129 Hypertensive chronic kidney disease with stage 1 through stage 4 chronic kidney disease, or unspecified chronic kidney disease: Secondary | ICD-10-CM | POA: Diagnosis not present

## 2024-06-05 DIAGNOSIS — K449 Diaphragmatic hernia without obstruction or gangrene: Secondary | ICD-10-CM | POA: Diagnosis not present

## 2024-06-05 DIAGNOSIS — R7303 Prediabetes: Secondary | ICD-10-CM | POA: Diagnosis not present

## 2024-06-05 DIAGNOSIS — M48062 Spinal stenosis, lumbar region with neurogenic claudication: Secondary | ICD-10-CM | POA: Diagnosis not present

## 2024-06-07 ENCOUNTER — Emergency Department
Admission: EM | Admit: 2024-06-07 | Discharge: 2024-06-07 | Disposition: A | Discharge status: Home / Self Care | Attending: Emergency Medicine | Admitting: Emergency Medicine

## 2024-06-07 ENCOUNTER — Other Ambulatory Visit: Payer: Self-pay

## 2024-06-07 DIAGNOSIS — R6889 Other general symptoms and signs: Secondary | ICD-10-CM | POA: Diagnosis not present

## 2024-06-07 DIAGNOSIS — N3001 Acute cystitis with hematuria: Secondary | ICD-10-CM | POA: Diagnosis not present

## 2024-06-07 DIAGNOSIS — R319 Hematuria, unspecified: Secondary | ICD-10-CM | POA: Diagnosis present

## 2024-06-07 LAB — COMPREHENSIVE METABOLIC PANEL WITH GFR
ALT: 19 U/L (ref 0–44)
AST: 24 U/L (ref 15–41)
Albumin: 3.3 g/dL — ABNORMAL LOW (ref 3.5–5.0)
Alkaline Phosphatase: 79 U/L (ref 38–126)
Anion gap: 8 (ref 5–15)
BUN: 19 mg/dL (ref 8–23)
CO2: 28 mmol/L (ref 22–32)
Calcium: 9.1 mg/dL (ref 8.9–10.3)
Chloride: 100 mmol/L (ref 98–111)
Creatinine, Ser: 0.87 mg/dL (ref 0.44–1.00)
GFR, Estimated: >60 mL/min (ref >60)
Glucose, Bld: 175 mg/dL — ABNORMAL HIGH (ref 70–99)
Potassium: 3.9 mmol/L (ref 3.5–5.1)
Sodium: 136 mmol/L (ref 135–145)
Total Bilirubin: 0.6 mg/dL (ref 0.0–1.2)
Total Protein: 6.8 g/dL (ref 6.5–8.1)

## 2024-06-07 LAB — CBC WITH DIFFERENTIAL/PLATELET
Abs Immature Granulocytes: 0.13 K/uL — ABNORMAL HIGH (ref 0.00–0.07)
Basophils Absolute: 0 K/uL (ref 0.0–0.1)
Basophils Relative: 0 %
Eosinophils Absolute: 0 K/uL (ref 0.0–0.5)
Eosinophils Relative: 0 %
HCT: 36.2 % (ref 36.0–46.0)
Hemoglobin: 11.7 g/dL — ABNORMAL LOW (ref 12.0–15.0)
Immature Granulocytes: 1 %
Lymphocytes Relative: 12 %
Lymphs Abs: 1.2 K/uL (ref 0.7–4.0)
MCH: 29.5 pg (ref 26.0–34.0)
MCHC: 32.3 g/dL (ref 30.0–36.0)
MCV: 91.4 fL (ref 80.0–100.0)
Monocytes Absolute: 0.5 K/uL (ref 0.1–1.0)
Monocytes Relative: 5 %
Neutro Abs: 8.3 K/uL — ABNORMAL HIGH (ref 1.7–7.7)
Neutrophils Relative %: 82 %
Platelets: 283 K/uL (ref 150–400)
RBC: 3.96 MIL/uL (ref 3.87–5.11)
RDW: 15 % (ref 11.5–15.5)
WBC: 10.2 K/uL (ref 4.0–10.5)
nRBC: 0.2 % (ref 0.0–0.2)

## 2024-06-07 LAB — URINALYSIS, ROUTINE W REFLEX MICROSCOPIC
Bilirubin Urine: NEGATIVE
Glucose, UA: NEGATIVE mg/dL
Ketones, ur: NEGATIVE mg/dL
Nitrite: POSITIVE — AB
Protein, ur: 100 mg/dL — AB
RBC / HPF: >50 RBC/hpf (ref 0–5)
Specific Gravity, Urine: 1.018 (ref 1.005–1.030)
WBC, UA: >50 WBC/hpf (ref 0–5)
pH: 7 (ref 5.0–8.0)

## 2024-06-07 MED ORDER — CEFUROXIME AXETIL 500 MG PO TABS: 500.0000 mg | ORAL_TABLET | Freq: Two times a day (BID) | ORAL | 0 refills | Status: AC

## 2024-06-07 NOTE — ED Notes (Signed)
 The pt's foley bag was removed successfully without incident.

## 2024-06-07 NOTE — ED Triage Notes (Addendum)
 From Home by EMS. Recent Right hip surgery on June 26. Had foley placed on the following Sunday for urinary rentention on June 29th. States she noticed some blood and mucous in foley bag in middle of the night. Potentially had some tugging on the catheter form the securing mechanism not velcroing

## 2024-06-07 NOTE — ED Provider Notes (Signed)
 [facility] Provider Note    Event Date/Time   First MD Initiated Contact with Patient 06/07/24 1307     (approximate)   History   Hematuria   HPI Bailey Brooks is a 73 y.o. female presenting today for hematuria.  Patient had episode of urinary retention approximately 1 week ago following recent hip surgery.  Foley catheter was placed at that time with complete resolution of all pain symptoms.  No sign of infection at that time.  Discharged with Foley catheter in place.  She states overnight she noticed an episode of blood in the urine as well as mucus.  She denies any abdominal pain, nausea, vomiting, fever, chills.  Separately, she also states she is not able to get into a urologist until 7/10 and no longer wants the Foley in.  Reviewed chart notes from ED visit 7 days ago.     Physical Exam   Triage Vital Signs: ED Triage Vitals  Encounter Vitals Group     BP 06/07/24 1250 137/70     Girls Systolic BP Percentile --      Girls Diastolic BP Percentile --      Boys Systolic BP Percentile --      Boys Diastolic BP Percentile --      Pulse Rate 06/07/24 1250 89     Resp 06/07/24 1250 15     Temp 06/07/24 1250 97.9 F (36.6 C)     Temp Source 06/07/24 1250 Oral     SpO2 --      Weight --      Height --      Head Circumference --      Peak Flow --      Pain Score 06/07/24 1247 2     Pain Loc --      Pain Education --      Exclude from Growth Chart --     Most recent vital signs: Vitals:   06/07/24 1250  BP: 137/70  Pulse: 89  Resp: 15  Temp: 97.9 F (36.6 C)   Physical Exam: I have reviewed the vital signs and nursing notes. General: Awake, alert, no acute distress.  Nontoxic appearing. Head:  Atraumatic, normocephalic.   ENT:  EOM intact, PERRL. Oral mucosa is pink and moist with no lesions. Neck: Neck is supple with full range of motion, No meningeal signs. Cardiovascular:  RRR, No murmurs. Peripheral pulses palpable and equal  bilaterally. Respiratory:  Symmetrical chest wall expansion.  No rhonchi, rales, or wheezes.  Good air movement throughout.  No use of accessory muscles.   Musculoskeletal:  No cyanosis or edema. Moving extremities with full ROM Abdomen:  Soft, nontender, nondistended. Neuro:  GCS 15, moving all four extremities, interacting appropriately. Speech clear. Psych:  Calm, appropriate.   Skin:  Warm, dry, no rash.    ED Results / Procedures / Treatments   Labs (all labs ordered are listed, but only abnormal results are displayed) Labs Reviewed  CBC WITH DIFFERENTIAL/PLATELET - Abnormal; Notable for the following components:      Result Value   Hemoglobin 11.7 (*)    Neutro Abs 8.3 (*)    Abs Immature Granulocytes 0.13 (*)    All other components within normal limits  COMPREHENSIVE METABOLIC PANEL WITH GFR - Abnormal; Notable for the following components:   Glucose, Bld 175 (*)    Albumin 3.3 (*)    All other components within normal limits  URINALYSIS, ROUTINE W REFLEX MICROSCOPIC - Abnormal;  Notable for the following components:   Color, Urine YELLOW (*)    APPearance CLOUDY (*)    Hgb urine dipstick LARGE (*)    Protein, ur 100 (*)    Nitrite POSITIVE (*)    Leukocytes,Ua LARGE (*)    Bacteria, UA FEW (*)    All other components within normal limits     EKG    RADIOLOGY    PROCEDURES:  Critical Care performed: No  Procedures   MEDICATIONS ORDERED IN ED: Medications - No data to display   IMPRESSION / MDM / ASSESSMENT AND PLAN / ED COURSE  I reviewed the triage vital signs and the nursing notes.                              Differential diagnosis includes, but is not limited to, UTI, urethral trauma  Patient's presentation is most consistent with acute complicated illness / injury requiring diagnostic workup.  Patient is a 73 year old female presenting today for hematuria and possible mucus in her urine with Foley catheter in place for the past week.  Urine  does show what looks to be like mucus floating in the urine.  No obvious bright red blood.  CBC and CMP unremarkable.  UA strongly positive for UTI.  Will start on Ceftin  at this time.  Patient stated multiple times that she does not wish to have the Foley catheter in place anymore.  I did explain to her the risks of potentially having worsening symptoms and recurrence of her urinary retention.  She is aware of this but still wishes to have the Foley catheter removed for trial of voiding at home.  She has follow-up with urology in 5 days.  Foley catheter removed and discharged with antibiotics.     FINAL CLINICAL IMPRESSION(S) / ED DIAGNOSES   Final diagnoses:  Acute cystitis with hematuria     Rx / DC Orders   ED Discharge Orders          Ordered    cefUROXime  (CEFTIN ) 500 MG tablet  2 times daily with meals        06/07/24 1424             Note:  This document was prepared using Dragon voice recognition software and may include unintentional dictation errors.   Malvina Alm DASEN, MD 06/07/24 1425

## 2024-06-07 NOTE — Discharge Instructions (Signed)
 I have sent antibiotics to your pharmacy for you to take as prescribed.  Please follow-up with urology.  Please return if you once again have inability to urinate.

## 2024-06-10 DIAGNOSIS — H3530 Unspecified macular degeneration: Secondary | ICD-10-CM | POA: Diagnosis not present

## 2024-06-10 DIAGNOSIS — K449 Diaphragmatic hernia without obstruction or gangrene: Secondary | ICD-10-CM | POA: Diagnosis not present

## 2024-06-10 DIAGNOSIS — N182 Chronic kidney disease, stage 2 (mild): Secondary | ICD-10-CM | POA: Diagnosis not present

## 2024-06-10 DIAGNOSIS — G894 Chronic pain syndrome: Secondary | ICD-10-CM | POA: Diagnosis not present

## 2024-06-10 DIAGNOSIS — R7303 Prediabetes: Secondary | ICD-10-CM | POA: Diagnosis not present

## 2024-06-10 DIAGNOSIS — M1612 Unilateral primary osteoarthritis, left hip: Secondary | ICD-10-CM | POA: Diagnosis not present

## 2024-06-10 DIAGNOSIS — I129 Hypertensive chronic kidney disease with stage 1 through stage 4 chronic kidney disease, or unspecified chronic kidney disease: Secondary | ICD-10-CM | POA: Diagnosis not present

## 2024-06-10 DIAGNOSIS — K5790 Diverticulosis of intestine, part unspecified, without perforation or abscess without bleeding: Secondary | ICD-10-CM | POA: Diagnosis not present

## 2024-06-10 DIAGNOSIS — Z8673 Personal history of transient ischemic attack (TIA), and cerebral infarction without residual deficits: Secondary | ICD-10-CM | POA: Diagnosis not present

## 2024-06-10 DIAGNOSIS — K219 Gastro-esophageal reflux disease without esophagitis: Secondary | ICD-10-CM | POA: Diagnosis not present

## 2024-06-10 DIAGNOSIS — M51379 Other intervertebral disc degeneration, lumbosacral region without mention of lumbar back pain or lower extremity pain: Secondary | ICD-10-CM | POA: Diagnosis not present

## 2024-06-10 DIAGNOSIS — E039 Hypothyroidism, unspecified: Secondary | ICD-10-CM | POA: Diagnosis not present

## 2024-06-10 DIAGNOSIS — Z471 Aftercare following joint replacement surgery: Secondary | ICD-10-CM | POA: Diagnosis not present

## 2024-06-10 DIAGNOSIS — Z96641 Presence of right artificial hip joint: Secondary | ICD-10-CM | POA: Diagnosis not present

## 2024-06-10 DIAGNOSIS — R339 Retention of urine, unspecified: Secondary | ICD-10-CM | POA: Diagnosis not present

## 2024-06-10 DIAGNOSIS — Z7901 Long term (current) use of anticoagulants: Secondary | ICD-10-CM | POA: Diagnosis not present

## 2024-06-10 DIAGNOSIS — G4710 Hypersomnia, unspecified: Secondary | ICD-10-CM | POA: Diagnosis not present

## 2024-06-10 DIAGNOSIS — M48062 Spinal stenosis, lumbar region with neurogenic claudication: Secondary | ICD-10-CM | POA: Diagnosis not present

## 2024-06-10 DIAGNOSIS — M47817 Spondylosis without myelopathy or radiculopathy, lumbosacral region: Secondary | ICD-10-CM | POA: Diagnosis not present

## 2024-06-10 DIAGNOSIS — E7800 Pure hypercholesterolemia, unspecified: Secondary | ICD-10-CM | POA: Diagnosis not present

## 2024-06-11 ENCOUNTER — Ambulatory Visit: Admitting: Urology

## 2024-06-12 DIAGNOSIS — Z471 Aftercare following joint replacement surgery: Secondary | ICD-10-CM | POA: Diagnosis not present

## 2024-06-12 DIAGNOSIS — Z8673 Personal history of transient ischemic attack (TIA), and cerebral infarction without residual deficits: Secondary | ICD-10-CM | POA: Diagnosis not present

## 2024-06-12 DIAGNOSIS — R339 Retention of urine, unspecified: Secondary | ICD-10-CM | POA: Diagnosis not present

## 2024-06-12 DIAGNOSIS — M51379 Other intervertebral disc degeneration, lumbosacral region without mention of lumbar back pain or lower extremity pain: Secondary | ICD-10-CM | POA: Diagnosis not present

## 2024-06-12 DIAGNOSIS — R7303 Prediabetes: Secondary | ICD-10-CM | POA: Diagnosis not present

## 2024-06-12 DIAGNOSIS — E039 Hypothyroidism, unspecified: Secondary | ICD-10-CM | POA: Diagnosis not present

## 2024-06-12 DIAGNOSIS — H3530 Unspecified macular degeneration: Secondary | ICD-10-CM | POA: Diagnosis not present

## 2024-06-12 DIAGNOSIS — Z96641 Presence of right artificial hip joint: Secondary | ICD-10-CM | POA: Diagnosis not present

## 2024-06-12 DIAGNOSIS — K5790 Diverticulosis of intestine, part unspecified, without perforation or abscess without bleeding: Secondary | ICD-10-CM | POA: Diagnosis not present

## 2024-06-12 DIAGNOSIS — N182 Chronic kidney disease, stage 2 (mild): Secondary | ICD-10-CM | POA: Diagnosis not present

## 2024-06-12 DIAGNOSIS — I129 Hypertensive chronic kidney disease with stage 1 through stage 4 chronic kidney disease, or unspecified chronic kidney disease: Secondary | ICD-10-CM | POA: Diagnosis not present

## 2024-06-12 DIAGNOSIS — M1612 Unilateral primary osteoarthritis, left hip: Secondary | ICD-10-CM | POA: Diagnosis not present

## 2024-06-12 DIAGNOSIS — M47817 Spondylosis without myelopathy or radiculopathy, lumbosacral region: Secondary | ICD-10-CM | POA: Diagnosis not present

## 2024-06-12 DIAGNOSIS — E7800 Pure hypercholesterolemia, unspecified: Secondary | ICD-10-CM | POA: Diagnosis not present

## 2024-06-12 DIAGNOSIS — G894 Chronic pain syndrome: Secondary | ICD-10-CM | POA: Diagnosis not present

## 2024-06-12 DIAGNOSIS — K219 Gastro-esophageal reflux disease without esophagitis: Secondary | ICD-10-CM | POA: Diagnosis not present

## 2024-06-12 DIAGNOSIS — K449 Diaphragmatic hernia without obstruction or gangrene: Secondary | ICD-10-CM | POA: Diagnosis not present

## 2024-06-12 DIAGNOSIS — G4710 Hypersomnia, unspecified: Secondary | ICD-10-CM | POA: Diagnosis not present

## 2024-06-12 DIAGNOSIS — M48062 Spinal stenosis, lumbar region with neurogenic claudication: Secondary | ICD-10-CM | POA: Diagnosis not present

## 2024-06-12 DIAGNOSIS — Z7901 Long term (current) use of anticoagulants: Secondary | ICD-10-CM | POA: Diagnosis not present

## 2024-06-13 ENCOUNTER — Ambulatory Visit: Admitting: Podiatry

## 2024-06-17 DIAGNOSIS — K449 Diaphragmatic hernia without obstruction or gangrene: Secondary | ICD-10-CM | POA: Diagnosis not present

## 2024-06-17 DIAGNOSIS — M47817 Spondylosis without myelopathy or radiculopathy, lumbosacral region: Secondary | ICD-10-CM | POA: Diagnosis not present

## 2024-06-17 DIAGNOSIS — M1612 Unilateral primary osteoarthritis, left hip: Secondary | ICD-10-CM | POA: Diagnosis not present

## 2024-06-17 DIAGNOSIS — H3530 Unspecified macular degeneration: Secondary | ICD-10-CM | POA: Diagnosis not present

## 2024-06-17 DIAGNOSIS — G894 Chronic pain syndrome: Secondary | ICD-10-CM | POA: Diagnosis not present

## 2024-06-17 DIAGNOSIS — M51379 Other intervertebral disc degeneration, lumbosacral region without mention of lumbar back pain or lower extremity pain: Secondary | ICD-10-CM | POA: Diagnosis not present

## 2024-06-17 DIAGNOSIS — R339 Retention of urine, unspecified: Secondary | ICD-10-CM | POA: Diagnosis not present

## 2024-06-17 DIAGNOSIS — R7303 Prediabetes: Secondary | ICD-10-CM | POA: Diagnosis not present

## 2024-06-17 DIAGNOSIS — I129 Hypertensive chronic kidney disease with stage 1 through stage 4 chronic kidney disease, or unspecified chronic kidney disease: Secondary | ICD-10-CM | POA: Diagnosis not present

## 2024-06-17 DIAGNOSIS — Z96641 Presence of right artificial hip joint: Secondary | ICD-10-CM | POA: Diagnosis not present

## 2024-06-17 DIAGNOSIS — M48062 Spinal stenosis, lumbar region with neurogenic claudication: Secondary | ICD-10-CM | POA: Diagnosis not present

## 2024-06-17 DIAGNOSIS — E039 Hypothyroidism, unspecified: Secondary | ICD-10-CM | POA: Diagnosis not present

## 2024-06-17 DIAGNOSIS — Z8673 Personal history of transient ischemic attack (TIA), and cerebral infarction without residual deficits: Secondary | ICD-10-CM | POA: Diagnosis not present

## 2024-06-17 DIAGNOSIS — Z7901 Long term (current) use of anticoagulants: Secondary | ICD-10-CM | POA: Diagnosis not present

## 2024-06-17 DIAGNOSIS — K219 Gastro-esophageal reflux disease without esophagitis: Secondary | ICD-10-CM | POA: Diagnosis not present

## 2024-06-17 DIAGNOSIS — G4710 Hypersomnia, unspecified: Secondary | ICD-10-CM | POA: Diagnosis not present

## 2024-06-17 DIAGNOSIS — N182 Chronic kidney disease, stage 2 (mild): Secondary | ICD-10-CM | POA: Diagnosis not present

## 2024-06-17 DIAGNOSIS — E7800 Pure hypercholesterolemia, unspecified: Secondary | ICD-10-CM | POA: Diagnosis not present

## 2024-06-17 DIAGNOSIS — K5790 Diverticulosis of intestine, part unspecified, without perforation or abscess without bleeding: Secondary | ICD-10-CM | POA: Diagnosis not present

## 2024-06-17 DIAGNOSIS — Z471 Aftercare following joint replacement surgery: Secondary | ICD-10-CM | POA: Diagnosis not present

## 2024-06-19 DIAGNOSIS — K219 Gastro-esophageal reflux disease without esophagitis: Secondary | ICD-10-CM | POA: Diagnosis not present

## 2024-06-19 DIAGNOSIS — M48062 Spinal stenosis, lumbar region with neurogenic claudication: Secondary | ICD-10-CM | POA: Diagnosis not present

## 2024-06-19 DIAGNOSIS — R339 Retention of urine, unspecified: Secondary | ICD-10-CM | POA: Diagnosis not present

## 2024-06-19 DIAGNOSIS — Z7901 Long term (current) use of anticoagulants: Secondary | ICD-10-CM | POA: Diagnosis not present

## 2024-06-19 DIAGNOSIS — K5790 Diverticulosis of intestine, part unspecified, without perforation or abscess without bleeding: Secondary | ICD-10-CM | POA: Diagnosis not present

## 2024-06-19 DIAGNOSIS — G4710 Hypersomnia, unspecified: Secondary | ICD-10-CM | POA: Diagnosis not present

## 2024-06-19 DIAGNOSIS — Z8673 Personal history of transient ischemic attack (TIA), and cerebral infarction without residual deficits: Secondary | ICD-10-CM | POA: Diagnosis not present

## 2024-06-19 DIAGNOSIS — E039 Hypothyroidism, unspecified: Secondary | ICD-10-CM | POA: Diagnosis not present

## 2024-06-19 DIAGNOSIS — M47817 Spondylosis without myelopathy or radiculopathy, lumbosacral region: Secondary | ICD-10-CM | POA: Diagnosis not present

## 2024-06-19 DIAGNOSIS — G894 Chronic pain syndrome: Secondary | ICD-10-CM | POA: Diagnosis not present

## 2024-06-19 DIAGNOSIS — M1612 Unilateral primary osteoarthritis, left hip: Secondary | ICD-10-CM | POA: Diagnosis not present

## 2024-06-19 DIAGNOSIS — H3530 Unspecified macular degeneration: Secondary | ICD-10-CM | POA: Diagnosis not present

## 2024-06-19 DIAGNOSIS — M51379 Other intervertebral disc degeneration, lumbosacral region without mention of lumbar back pain or lower extremity pain: Secondary | ICD-10-CM | POA: Diagnosis not present

## 2024-06-19 DIAGNOSIS — I129 Hypertensive chronic kidney disease with stage 1 through stage 4 chronic kidney disease, or unspecified chronic kidney disease: Secondary | ICD-10-CM | POA: Diagnosis not present

## 2024-06-19 DIAGNOSIS — Z96641 Presence of right artificial hip joint: Secondary | ICD-10-CM | POA: Diagnosis not present

## 2024-06-19 DIAGNOSIS — N182 Chronic kidney disease, stage 2 (mild): Secondary | ICD-10-CM | POA: Diagnosis not present

## 2024-06-19 DIAGNOSIS — K449 Diaphragmatic hernia without obstruction or gangrene: Secondary | ICD-10-CM | POA: Diagnosis not present

## 2024-06-19 DIAGNOSIS — R7303 Prediabetes: Secondary | ICD-10-CM | POA: Diagnosis not present

## 2024-06-19 DIAGNOSIS — Z471 Aftercare following joint replacement surgery: Secondary | ICD-10-CM | POA: Diagnosis not present

## 2024-06-19 DIAGNOSIS — E7800 Pure hypercholesterolemia, unspecified: Secondary | ICD-10-CM | POA: Diagnosis not present

## 2024-06-23 ENCOUNTER — Ambulatory Visit: Admitting: Urology

## 2024-06-23 ENCOUNTER — Ambulatory Visit: Admitting: Physician Assistant

## 2024-06-25 ENCOUNTER — Ambulatory Visit

## 2024-06-25 DIAGNOSIS — K449 Diaphragmatic hernia without obstruction or gangrene: Secondary | ICD-10-CM | POA: Diagnosis not present

## 2024-06-25 DIAGNOSIS — G894 Chronic pain syndrome: Secondary | ICD-10-CM | POA: Diagnosis not present

## 2024-06-25 DIAGNOSIS — E039 Hypothyroidism, unspecified: Secondary | ICD-10-CM | POA: Diagnosis not present

## 2024-06-25 DIAGNOSIS — Z8673 Personal history of transient ischemic attack (TIA), and cerebral infarction without residual deficits: Secondary | ICD-10-CM | POA: Diagnosis not present

## 2024-06-25 DIAGNOSIS — Z7901 Long term (current) use of anticoagulants: Secondary | ICD-10-CM | POA: Diagnosis not present

## 2024-06-25 DIAGNOSIS — M47817 Spondylosis without myelopathy or radiculopathy, lumbosacral region: Secondary | ICD-10-CM | POA: Diagnosis not present

## 2024-06-25 DIAGNOSIS — K219 Gastro-esophageal reflux disease without esophagitis: Secondary | ICD-10-CM | POA: Diagnosis not present

## 2024-06-25 DIAGNOSIS — G4710 Hypersomnia, unspecified: Secondary | ICD-10-CM | POA: Diagnosis not present

## 2024-06-25 DIAGNOSIS — R7303 Prediabetes: Secondary | ICD-10-CM | POA: Diagnosis not present

## 2024-06-25 DIAGNOSIS — H3530 Unspecified macular degeneration: Secondary | ICD-10-CM | POA: Diagnosis not present

## 2024-06-25 DIAGNOSIS — I129 Hypertensive chronic kidney disease with stage 1 through stage 4 chronic kidney disease, or unspecified chronic kidney disease: Secondary | ICD-10-CM | POA: Diagnosis not present

## 2024-06-25 DIAGNOSIS — Z471 Aftercare following joint replacement surgery: Secondary | ICD-10-CM | POA: Diagnosis not present

## 2024-06-25 DIAGNOSIS — M51379 Other intervertebral disc degeneration, lumbosacral region without mention of lumbar back pain or lower extremity pain: Secondary | ICD-10-CM | POA: Diagnosis not present

## 2024-06-25 DIAGNOSIS — R339 Retention of urine, unspecified: Secondary | ICD-10-CM | POA: Diagnosis not present

## 2024-06-25 DIAGNOSIS — E7800 Pure hypercholesterolemia, unspecified: Secondary | ICD-10-CM | POA: Diagnosis not present

## 2024-06-25 DIAGNOSIS — M48062 Spinal stenosis, lumbar region with neurogenic claudication: Secondary | ICD-10-CM | POA: Diagnosis not present

## 2024-06-25 DIAGNOSIS — Z96641 Presence of right artificial hip joint: Secondary | ICD-10-CM | POA: Diagnosis not present

## 2024-06-25 DIAGNOSIS — M1612 Unilateral primary osteoarthritis, left hip: Secondary | ICD-10-CM | POA: Diagnosis not present

## 2024-06-25 DIAGNOSIS — K5790 Diverticulosis of intestine, part unspecified, without perforation or abscess without bleeding: Secondary | ICD-10-CM | POA: Diagnosis not present

## 2024-06-25 DIAGNOSIS — N182 Chronic kidney disease, stage 2 (mild): Secondary | ICD-10-CM | POA: Diagnosis not present

## 2024-06-26 DIAGNOSIS — R7303 Prediabetes: Secondary | ICD-10-CM | POA: Diagnosis not present

## 2024-06-26 DIAGNOSIS — M48062 Spinal stenosis, lumbar region with neurogenic claudication: Secondary | ICD-10-CM | POA: Diagnosis not present

## 2024-06-26 DIAGNOSIS — R339 Retention of urine, unspecified: Secondary | ICD-10-CM | POA: Diagnosis not present

## 2024-06-26 DIAGNOSIS — K449 Diaphragmatic hernia without obstruction or gangrene: Secondary | ICD-10-CM | POA: Diagnosis not present

## 2024-06-26 DIAGNOSIS — E7800 Pure hypercholesterolemia, unspecified: Secondary | ICD-10-CM | POA: Diagnosis not present

## 2024-06-26 DIAGNOSIS — Z7901 Long term (current) use of anticoagulants: Secondary | ICD-10-CM | POA: Diagnosis not present

## 2024-06-26 DIAGNOSIS — M1612 Unilateral primary osteoarthritis, left hip: Secondary | ICD-10-CM | POA: Diagnosis not present

## 2024-06-26 DIAGNOSIS — Z471 Aftercare following joint replacement surgery: Secondary | ICD-10-CM | POA: Diagnosis not present

## 2024-06-26 DIAGNOSIS — H3530 Unspecified macular degeneration: Secondary | ICD-10-CM | POA: Diagnosis not present

## 2024-06-26 DIAGNOSIS — K219 Gastro-esophageal reflux disease without esophagitis: Secondary | ICD-10-CM | POA: Diagnosis not present

## 2024-06-26 DIAGNOSIS — G894 Chronic pain syndrome: Secondary | ICD-10-CM | POA: Diagnosis not present

## 2024-06-26 DIAGNOSIS — M47817 Spondylosis without myelopathy or radiculopathy, lumbosacral region: Secondary | ICD-10-CM | POA: Diagnosis not present

## 2024-06-26 DIAGNOSIS — K5790 Diverticulosis of intestine, part unspecified, without perforation or abscess without bleeding: Secondary | ICD-10-CM | POA: Diagnosis not present

## 2024-06-26 DIAGNOSIS — G4710 Hypersomnia, unspecified: Secondary | ICD-10-CM | POA: Diagnosis not present

## 2024-06-26 DIAGNOSIS — Z96641 Presence of right artificial hip joint: Secondary | ICD-10-CM | POA: Diagnosis not present

## 2024-06-26 DIAGNOSIS — Z8673 Personal history of transient ischemic attack (TIA), and cerebral infarction without residual deficits: Secondary | ICD-10-CM | POA: Diagnosis not present

## 2024-06-26 DIAGNOSIS — M51379 Other intervertebral disc degeneration, lumbosacral region without mention of lumbar back pain or lower extremity pain: Secondary | ICD-10-CM | POA: Diagnosis not present

## 2024-06-26 DIAGNOSIS — N182 Chronic kidney disease, stage 2 (mild): Secondary | ICD-10-CM | POA: Diagnosis not present

## 2024-06-26 DIAGNOSIS — I129 Hypertensive chronic kidney disease with stage 1 through stage 4 chronic kidney disease, or unspecified chronic kidney disease: Secondary | ICD-10-CM | POA: Diagnosis not present

## 2024-06-26 DIAGNOSIS — E039 Hypothyroidism, unspecified: Secondary | ICD-10-CM | POA: Diagnosis not present

## 2024-06-30 DIAGNOSIS — N182 Chronic kidney disease, stage 2 (mild): Secondary | ICD-10-CM | POA: Diagnosis not present

## 2024-06-30 DIAGNOSIS — Z96641 Presence of right artificial hip joint: Secondary | ICD-10-CM | POA: Diagnosis not present

## 2024-06-30 DIAGNOSIS — R7303 Prediabetes: Secondary | ICD-10-CM | POA: Diagnosis not present

## 2024-06-30 DIAGNOSIS — M1612 Unilateral primary osteoarthritis, left hip: Secondary | ICD-10-CM | POA: Diagnosis not present

## 2024-06-30 DIAGNOSIS — G4710 Hypersomnia, unspecified: Secondary | ICD-10-CM | POA: Diagnosis not present

## 2024-06-30 DIAGNOSIS — M51379 Other intervertebral disc degeneration, lumbosacral region without mention of lumbar back pain or lower extremity pain: Secondary | ICD-10-CM | POA: Diagnosis not present

## 2024-06-30 DIAGNOSIS — M48062 Spinal stenosis, lumbar region with neurogenic claudication: Secondary | ICD-10-CM | POA: Diagnosis not present

## 2024-06-30 DIAGNOSIS — Z8673 Personal history of transient ischemic attack (TIA), and cerebral infarction without residual deficits: Secondary | ICD-10-CM | POA: Diagnosis not present

## 2024-06-30 DIAGNOSIS — G894 Chronic pain syndrome: Secondary | ICD-10-CM | POA: Diagnosis not present

## 2024-06-30 DIAGNOSIS — I129 Hypertensive chronic kidney disease with stage 1 through stage 4 chronic kidney disease, or unspecified chronic kidney disease: Secondary | ICD-10-CM | POA: Diagnosis not present

## 2024-06-30 DIAGNOSIS — K449 Diaphragmatic hernia without obstruction or gangrene: Secondary | ICD-10-CM | POA: Diagnosis not present

## 2024-06-30 DIAGNOSIS — R339 Retention of urine, unspecified: Secondary | ICD-10-CM | POA: Diagnosis not present

## 2024-06-30 DIAGNOSIS — K5790 Diverticulosis of intestine, part unspecified, without perforation or abscess without bleeding: Secondary | ICD-10-CM | POA: Diagnosis not present

## 2024-06-30 DIAGNOSIS — H3530 Unspecified macular degeneration: Secondary | ICD-10-CM | POA: Diagnosis not present

## 2024-06-30 DIAGNOSIS — Z7901 Long term (current) use of anticoagulants: Secondary | ICD-10-CM | POA: Diagnosis not present

## 2024-06-30 DIAGNOSIS — E039 Hypothyroidism, unspecified: Secondary | ICD-10-CM | POA: Diagnosis not present

## 2024-06-30 DIAGNOSIS — M47817 Spondylosis without myelopathy or radiculopathy, lumbosacral region: Secondary | ICD-10-CM | POA: Diagnosis not present

## 2024-06-30 DIAGNOSIS — E7800 Pure hypercholesterolemia, unspecified: Secondary | ICD-10-CM | POA: Diagnosis not present

## 2024-06-30 DIAGNOSIS — Z471 Aftercare following joint replacement surgery: Secondary | ICD-10-CM | POA: Diagnosis not present

## 2024-06-30 DIAGNOSIS — K219 Gastro-esophageal reflux disease without esophagitis: Secondary | ICD-10-CM | POA: Diagnosis not present

## 2024-07-01 ENCOUNTER — Ambulatory Visit (INDEPENDENT_AMBULATORY_CARE_PROVIDER_SITE_OTHER)

## 2024-07-01 VITALS — BP 120/80 | HR 78 | Temp 98.4°F | Ht 63.0 in | Wt 237.4 lb

## 2024-07-01 DIAGNOSIS — Z96641 Presence of right artificial hip joint: Secondary | ICD-10-CM | POA: Diagnosis not present

## 2024-07-01 DIAGNOSIS — K2100 Gastro-esophageal reflux disease with esophagitis, without bleeding: Secondary | ICD-10-CM

## 2024-07-01 DIAGNOSIS — N3001 Acute cystitis with hematuria: Secondary | ICD-10-CM | POA: Insufficient documentation

## 2024-07-01 DIAGNOSIS — E782 Mixed hyperlipidemia: Secondary | ICD-10-CM

## 2024-07-01 DIAGNOSIS — G8929 Other chronic pain: Secondary | ICD-10-CM | POA: Diagnosis not present

## 2024-07-01 DIAGNOSIS — E039 Hypothyroidism, unspecified: Secondary | ICD-10-CM

## 2024-07-01 DIAGNOSIS — I10 Essential (primary) hypertension: Secondary | ICD-10-CM

## 2024-07-01 DIAGNOSIS — M545 Low back pain, unspecified: Secondary | ICD-10-CM

## 2024-07-01 DIAGNOSIS — D649 Anemia, unspecified: Secondary | ICD-10-CM | POA: Diagnosis not present

## 2024-07-01 LAB — HEMOGLOBIN: Hemoglobin: 11.3 g/dL — ABNORMAL LOW (ref 12.0–15.0)

## 2024-07-01 LAB — URINALYSIS, ROUTINE W REFLEX MICROSCOPIC
Bilirubin Urine: NEGATIVE
Ketones, ur: NEGATIVE
Nitrite: POSITIVE — AB
Specific Gravity, Urine: 1.025 (ref 1.000–1.030)
Total Protein, Urine: 30 — AB
Urine Glucose: NEGATIVE
Urobilinogen, UA: 0.2 (ref 0.0–1.0)
pH: 6 (ref 5.0–8.0)

## 2024-07-01 LAB — BASIC METABOLIC PANEL WITH GFR
BUN: 29 mg/dL — ABNORMAL HIGH (ref 6–23)
CO2: 29 meq/L (ref 19–32)
Calcium: 9.3 mg/dL (ref 8.4–10.5)
Chloride: 102 meq/L (ref 96–112)
Creatinine, Ser: 1.06 mg/dL (ref 0.40–1.20)
GFR: 51.78 mL/min — ABNORMAL LOW (ref >60.00)
Glucose, Bld: 102 mg/dL — ABNORMAL HIGH (ref 70–99)
Potassium: 4 meq/L (ref 3.5–5.1)
Sodium: 141 meq/L (ref 135–145)

## 2024-07-01 LAB — LIPID PANEL
Cholesterol: 118 mg/dL (ref 0–200)
HDL: 44.7 mg/dL (ref >39.00)
LDL Cholesterol: 46 mg/dL (ref 0–99)
NonHDL: 73.6
Total CHOL/HDL Ratio: 3
Triglycerides: 138 mg/dL (ref 0.0–149.0)
VLDL: 27.6 mg/dL (ref 0.0–40.0)

## 2024-07-01 MED ORDER — EZETIMIBE 10 MG PO TABS: ORAL_TABLET | ORAL | 3 refills | Status: AC

## 2024-07-01 MED ORDER — LISINOPRIL 40 MG PO TABS: 40.0000 mg | ORAL_TABLET | Freq: Every day | ORAL | 3 refills | Status: AC

## 2024-07-01 MED ORDER — ATORVASTATIN CALCIUM 80 MG PO TABS: ORAL_TABLET | ORAL | 3 refills | Status: AC

## 2024-07-01 MED ORDER — HYDROCHLOROTHIAZIDE 12.5 MG PO TABS: 12.5000 mg | ORAL_TABLET | Freq: Every day | ORAL | 3 refills | Status: AC

## 2024-07-01 NOTE — Assessment & Plan Note (Signed)
 Chronic, previous TSH within normal limit, no new concerns, continue Synthroid  88 mcg daily.

## 2024-07-01 NOTE — Assessment & Plan Note (Signed)
 Chronic, stable on lansoprazole  30 mg daily, continue

## 2024-07-01 NOTE — Assessment & Plan Note (Signed)
 Likely due to surgical blood loss from right total hip replacement on 05/29/24. Repeat hemoglobin today.

## 2024-07-01 NOTE — Progress Notes (Signed)
 Established Patient Office Visit   Subjective  Patient ID: Bailey Brooks, female    DOB: 20-Jul-1951  Age: 73 y.o. 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), Diverticulosis, Eczema, Esophagitis, Family history of breast cancer, Family history of melanoma, Family history of prostate cancer, GERD (gastroesophageal reflux disease), H/O stem cell transplant (HCC) (06/2011), Hemorrhoids, History of breast cancer (10/01/2023), History of stress test (05/19/2010), Hypercholesterolemia, Hypertension, Hypokalemia (02/10/2017), Hypomagnesemia (03/02/2016), Hypothyroidism, Long term current use of aromatase inhibitor, Morbid obesity (HCC), Muscle cramps (11/13/2012), Ommaya reservoir present (2012), 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  HPI Discussed the use of AI scribe software for clinical note transcription with the patient, who gave verbal consent to proceed.  History of Present Illness - Underwent right total hip replacement on 05/29/2024. On Aspirin  for VTE prophylaxis per surgeon. Follow up with Dr. Edie or team on 07/11/24. She is currently going through at home physical therapy.  Initially, she took Eliquis  for two weeks post-surgery as prescribed by her orthopedic surgeon, followed by full-dose aspirin  to prevent DVT. She is currently taking Tylenol  2 tabs at bedtime for pain control.   - ED visit for urinary retention concerns on 05/31/2024, was referred to urologist Alvaro, Ricardo KATHEE Raddle., MD from ED. Urinary catheter in place. Patient  reports she was not able to see urologist with [facility] till 7/21.   - ED visit on 06/07/24 seen for hematuria. UA with positive nitrites and leukocytes. She was treated with Cefuroxime  500 mg BID. Last CMP from 06/07/24 with GFR 60, Cr 0.87. Last Hb on 11.7. She had catheter removed during this visit. Urinary symptoms have been resolved fully.   ----------------- Chronic:  Hyperlipidemia: On Zetia  10 mg, Atorvastatin  80 mg daily. Tolerating well.  Hypertension: On Hydrochlorothiazide  12.5 mg, lisinopril  40 mg.  No chest pain, lower leg edema. Only checking BP at home prn.  GERD: On Lansoprazole  30 mg once a day, symptoms well controlled.  Hypothyroidism: On Synthroid  88 mcg daily. No new concerns.   ROS As per HPI    Objective:     BP 120/80 (BP Location: Right Arm, Cuff Size: Large)   Pulse 78   Temp 98.4 F (36.9 C) (Oral)   Ht 5' 3 (1.6 m)   Wt 237 lb 6.4 oz (107.7 kg)   SpO2 96%   BMI 42.05 kg/m      07/01/2024   10:03 AM 03/05/2024   11:01 AM 12/28/2023   10:36 AM  Depression screen PHQ 2/9  Decreased Interest 0 0 0  Down, Depressed, Hopeless 0 0 0  PHQ - 2 Score 0 0 0  Altered sleeping 0 0 0  Tired, decreased energy 1 1 1   Change in appetite 1 1 0  Feeling bad or failure about yourself  0 0 0  Trouble concentrating 0 0 0  Moving slowly or fidgety/restless 0 0 0  Suicidal thoughts 0 0 0  PHQ-9 Score 2 2 1   Difficult doing work/chores Somewhat difficult Not difficult at all Somewhat difficult      07/01/2024   10:04 AM 03/05/2024   11:02 AM 12/28/2023   10:37 AM  08/24/2023   11:46 AM  GAD 7 : Generalized Anxiety Score  Nervous, Anxious, on Edge 1 1 0 0  Control/stop worrying 0 0 0 0  Worry too much - different things 0 0 0 0  Trouble relaxing 1 1 0 0  Restless 0 0 0 0  Easily annoyed or irritable 0 1 0 0  Afraid - awful might happen 0 0 0 0  Total GAD 7 Score 2 3 0 0  Anxiety Difficulty Not difficult at all Not difficult at all Not difficult at all Not  difficult at all      07/01/2024   10:03 AM 03/05/2024   11:01 AM 12/28/2023   10:36 AM  Depression screen PHQ 2/9  Decreased Interest 0 0 0  Down, Depressed, Hopeless 0 0 0  PHQ - 2 Score 0 0 0  Altered sleeping 0 0 0  Tired, decreased energy 1 1 1   Change in appetite 1 1 0  Feeling bad or failure about yourself  0 0 0  Trouble concentrating 0 0 0  Moving slowly or fidgety/restless 0 0 0  Suicidal thoughts 0 0 0  PHQ-9 Score 2 2 1   Difficult doing work/chores Somewhat difficult Not difficult at all Somewhat difficult      07/01/2024   10:04 AM 03/05/2024   11:02 AM 12/28/2023   10:37 AM 08/24/2023   11:46 AM  GAD 7 : Generalized Anxiety Score  Nervous, Anxious, on Edge 1 1 0 0  Control/stop worrying 0 0 0 0  Worry too much - different things 0 0 0 0  Trouble relaxing 1 1 0 0  Restless 0 0 0 0  Easily annoyed or irritable 0 1 0 0  Afraid - awful might happen 0 0 0 0  Total GAD 7 Score 2 3 0 0  Anxiety Difficulty Not difficult at all Not difficult at all Not difficult at all Not difficult at all   SDOH Screenings   Food Insecurity: No Food Insecurity (06/30/2024)  Housing: Low Risk  (06/30/2024)  Transportation Needs: No Transportation Needs (06/30/2024)  Utilities: Not At Risk (05/29/2024)  Alcohol Screen: Low Risk  (04/26/2023)  Depression (PHQ2-9): Low Risk  (07/01/2024)  Financial Resource Strain: Low Risk  (06/30/2024)  Physical Activity: Inactive (06/30/2024)  Social Connections: Socially Integrated (06/30/2024)  Stress: No Stress Concern Present (06/30/2024)  Tobacco Use: Low Risk  (07/01/2024)     Physical Exam Constitutional:      General: She is not in acute distress.    Appearance: She is obese.  HENT:     Head: Normocephalic and atraumatic.     Mouth/Throat:     Mouth: Mucous membranes are moist.  Cardiovascular:     Rate and Rhythm: Normal rate.  Pulmonary:     Effort: Pulmonary effort is normal.     Breath sounds: Normal breath sounds. No wheezing.   Abdominal:     General: Abdomen is protuberant.     Palpations: Abdomen is soft.     Tenderness: There is no right CVA tenderness, left CVA tenderness, guarding or rebound.  Musculoskeletal:     Cervical back: No rigidity.     Right lower leg: No edema.     Left lower leg: No edema.     Comments: Healing right hip scar from recent surgery  Lymphadenopathy:     Cervical: No cervical adenopathy.  Skin:    Findings: Bruising (on left forearm, right forearm) present.  Neurological:  Mental Status: She is alert and oriented to person, place, and time.     Gait: Gait abnormal (antalgic).  Psychiatric:        Mood and Affect: Mood normal.        No results found for any visits on 07/01/24.  The ASCVD Risk score (Arnett DK, et al., 2019) failed to calculate for the following reasons:   Risk score cannot be calculated because patient has a medical history suggesting prior/existing ASCVD     Assessment & Plan:   Hematuria due to acute cystitis Assessment & Plan: Urinary retention post right hip replacement on 05/31/24 needing Foley's catheter. Developed UTI on 06/07/24, was treated with Cefuroxime . Currently asymptomatic, will check UA, urine culture and BMP. If suggestive of infection will treat appropriately with antibiotic. Patient never established care with urologist. If hematuria, can consider referral to urology otherwise will monitor for new symptoms and manage accordingly.    Orders: -     Urinalysis, Routine w reflex microscopic -     Basic metabolic panel with GFR -     Urine Culture  Low hemoglobin Assessment & Plan: Likely due to surgical blood loss from right total hip replacement on 05/29/24. Repeat hemoglobin today.   Orders: -     Hemoglobin  Mixed hyperlipidemia Assessment & Plan: Check lipid panel, patient reports she is fasting today. Previous LDL within good control. Continue Zetia  10 mg, Atorvastatin  80 mg daily.   Orders: -     Atorvastatin  Calcium ;  TAKE 1 TABLET BY MOUTH DAILY  Dispense: 90 tablet; Refill: 3 -     Ezetimibe ; TAKE 1 TABLET BY MOUTH DAILY  Dispense: 90 tablet; Refill: 3 -     Lipid panel  Essential hypertension Assessment & Plan: Chronic, Blood pressure slightly elevated at the visit, on arrival. Repeat BP 120/80 mmHg.  Likely due to not taking medication before the appointment. Generally well-controlled with current medication regimen. - Continue Lisinopril  40 mg daily. - Continue Hydrochlorothiazide  12.5 mg daily. - Check BMP  Orders: -     hydroCHLOROthiazide ; Take 1 tablet (12.5 mg total) by mouth daily.  Dispense: 90 tablet; Refill: 3 -     Lisinopril ; Take 1 tablet (40 mg total) by mouth daily.  Dispense: 90 tablet; Refill: 3  Gastroesophageal reflux disease with esophagitis without hemorrhage Assessment & Plan: Chronic, stable on lansoprazole  30 mg daily, continue   Acquired hypothyroidism Assessment & Plan: Chronic, previous TSH within normal limit, no new concerns, continue Synthroid  88 mcg daily.   Chronic low back pain (1ry area of Pain) (Bilateral) (R>L) w/o sciatica Assessment & Plan: - Encourage continued movement and physical activity. - Discuss back exercises with home health personnel.    Status post total hip replacement, right Assessment & Plan: Status post right hip replacement. Post-operative recovery progressing well with tolerable pain and increased strength. Mild surgical site pain, no significant hip pain. Driving resumed.Continue physical therapy with three more home visits.Follow up with orthopedic surgeon on August 8th. Continue full-dose aspirin  as per surgeon's advice, then switch to 81 mg aspirin .    I spent 40 minutes on the day of this face-to-face encounter reviewing the patient's medical and surgical history, medications, ongoing concerns, and reviewing the assessment and plan with the patient.  Additionally, I spent time post-visit ordering and reviewing diagnostics and  therapeutics with the patient.   Return in about 6 months (around 01/01/2025) for Chronic follow up .   Luke Shade, MD

## 2024-07-01 NOTE — Assessment & Plan Note (Signed)
-   Encourage continued movement and physical activity. - Discuss back exercises with home health personnel.

## 2024-07-01 NOTE — Assessment & Plan Note (Signed)
 Status post right hip replacement. Post-operative recovery progressing well with tolerable pain and increased strength. Mild surgical site pain, no significant hip pain. Driving resumed.Continue physical therapy with three more home visits.Follow up with orthopedic surgeon on August 8th. Continue full-dose aspirin  as per surgeon's advice, then switch to 81 mg aspirin .

## 2024-07-01 NOTE — Assessment & Plan Note (Signed)
 Chronic, Blood pressure slightly elevated at the visit, on arrival. Repeat BP 120/80 mmHg.  Likely due to not taking medication before the appointment. Generally well-controlled with current medication regimen. - Continue Lisinopril  40 mg daily. - Continue Hydrochlorothiazide  12.5 mg daily. - Check BMP

## 2024-07-01 NOTE — Patient Instructions (Signed)
-   We will update your labs today, if everything looks good, plan on follow up in 6 months. If urine shows blood, I will refer you to an urologist.

## 2024-07-01 NOTE — Assessment & Plan Note (Signed)
 Check lipid panel, patient reports she is fasting today. Previous LDL within good control. Continue Zetia  10 mg, Atorvastatin  80 mg daily.

## 2024-07-01 NOTE — Assessment & Plan Note (Signed)
 Urinary retention post right hip replacement on 05/31/24 needing Foley's catheter. Developed UTI on 06/07/24, was treated with Cefuroxime . Currently asymptomatic, will check UA, urine culture and BMP. If suggestive of infection will treat appropriately with antibiotic. Patient never established care with urologist. If hematuria, can consider referral to urology otherwise will monitor for new symptoms and manage accordingly.

## 2024-07-02 ENCOUNTER — Ambulatory Visit: Payer: Self-pay

## 2024-07-02 DIAGNOSIS — M47817 Spondylosis without myelopathy or radiculopathy, lumbosacral region: Secondary | ICD-10-CM | POA: Diagnosis not present

## 2024-07-02 DIAGNOSIS — E7800 Pure hypercholesterolemia, unspecified: Secondary | ICD-10-CM | POA: Diagnosis not present

## 2024-07-02 DIAGNOSIS — G4710 Hypersomnia, unspecified: Secondary | ICD-10-CM | POA: Diagnosis not present

## 2024-07-02 DIAGNOSIS — N3000 Acute cystitis without hematuria: Secondary | ICD-10-CM

## 2024-07-02 DIAGNOSIS — N182 Chronic kidney disease, stage 2 (mild): Secondary | ICD-10-CM | POA: Diagnosis not present

## 2024-07-02 DIAGNOSIS — Z471 Aftercare following joint replacement surgery: Secondary | ICD-10-CM | POA: Diagnosis not present

## 2024-07-02 DIAGNOSIS — Z96641 Presence of right artificial hip joint: Secondary | ICD-10-CM | POA: Diagnosis not present

## 2024-07-02 DIAGNOSIS — Z8673 Personal history of transient ischemic attack (TIA), and cerebral infarction without residual deficits: Secondary | ICD-10-CM | POA: Diagnosis not present

## 2024-07-02 DIAGNOSIS — M1612 Unilateral primary osteoarthritis, left hip: Secondary | ICD-10-CM | POA: Diagnosis not present

## 2024-07-02 DIAGNOSIS — E039 Hypothyroidism, unspecified: Secondary | ICD-10-CM | POA: Diagnosis not present

## 2024-07-02 DIAGNOSIS — K449 Diaphragmatic hernia without obstruction or gangrene: Secondary | ICD-10-CM | POA: Diagnosis not present

## 2024-07-02 DIAGNOSIS — G894 Chronic pain syndrome: Secondary | ICD-10-CM | POA: Diagnosis not present

## 2024-07-02 DIAGNOSIS — M51379 Other intervertebral disc degeneration, lumbosacral region without mention of lumbar back pain or lower extremity pain: Secondary | ICD-10-CM | POA: Diagnosis not present

## 2024-07-02 DIAGNOSIS — K5790 Diverticulosis of intestine, part unspecified, without perforation or abscess without bleeding: Secondary | ICD-10-CM | POA: Diagnosis not present

## 2024-07-02 DIAGNOSIS — K219 Gastro-esophageal reflux disease without esophagitis: Secondary | ICD-10-CM | POA: Diagnosis not present

## 2024-07-02 DIAGNOSIS — R7303 Prediabetes: Secondary | ICD-10-CM | POA: Diagnosis not present

## 2024-07-02 DIAGNOSIS — I129 Hypertensive chronic kidney disease with stage 1 through stage 4 chronic kidney disease, or unspecified chronic kidney disease: Secondary | ICD-10-CM | POA: Diagnosis not present

## 2024-07-02 DIAGNOSIS — H3530 Unspecified macular degeneration: Secondary | ICD-10-CM | POA: Diagnosis not present

## 2024-07-02 DIAGNOSIS — Z7901 Long term (current) use of anticoagulants: Secondary | ICD-10-CM | POA: Diagnosis not present

## 2024-07-02 DIAGNOSIS — R339 Retention of urine, unspecified: Secondary | ICD-10-CM | POA: Diagnosis not present

## 2024-07-02 DIAGNOSIS — M48062 Spinal stenosis, lumbar region with neurogenic claudication: Secondary | ICD-10-CM | POA: Diagnosis not present

## 2024-07-02 MED ORDER — AMOXICILLIN-POT CLAVULANATE 875-125 MG PO TABS
1.0000 | ORAL_TABLET | Freq: Two times a day (BID) | ORAL | 0 refills | Status: AC
Start: 2024-07-02 — End: 2024-07-07

## 2024-07-02 NOTE — Progress Notes (Signed)
 Please let the patient know her preliminary urine result is suggestive of urinary tract infection. Pending urine culture result. I recommend treating with antibiotic called Augmentin  for 5 days. Recommend she takes over the counter daily probiotic for 10 days to reduce antibiotic sideeffects like diarrhea, yeast infection. I also recommend she sees an urologist as she continues to have some trace blood in her urine. This could be related to urinary tract infection but given her history of urinary retention, recurrent UTI she will benefit from seeing urologist. I have put in a referral for urology.   Hemoglobin is stable. Cholesterol looks good. Kidney function shows slight reduction in filtration, recommend staying hydrated. Drink about 50 oz of water daily.  Remember to avoid use of Advil  , Motrin , Aleve , referred to as NSAIDs) to protect your kidney function.   Thank you,  Luke Shade, MD

## 2024-07-04 LAB — URINE CULTURE
MICRO NUMBER:: 16759452
SPECIMEN QUALITY:: ADEQUATE

## 2024-07-07 DIAGNOSIS — M1612 Unilateral primary osteoarthritis, left hip: Secondary | ICD-10-CM | POA: Diagnosis not present

## 2024-07-07 DIAGNOSIS — R7303 Prediabetes: Secondary | ICD-10-CM | POA: Diagnosis not present

## 2024-07-07 DIAGNOSIS — K5790 Diverticulosis of intestine, part unspecified, without perforation or abscess without bleeding: Secondary | ICD-10-CM | POA: Diagnosis not present

## 2024-07-07 DIAGNOSIS — H3530 Unspecified macular degeneration: Secondary | ICD-10-CM | POA: Diagnosis not present

## 2024-07-07 DIAGNOSIS — Z96641 Presence of right artificial hip joint: Secondary | ICD-10-CM | POA: Diagnosis not present

## 2024-07-07 DIAGNOSIS — Z8673 Personal history of transient ischemic attack (TIA), and cerebral infarction without residual deficits: Secondary | ICD-10-CM | POA: Diagnosis not present

## 2024-07-07 DIAGNOSIS — Z471 Aftercare following joint replacement surgery: Secondary | ICD-10-CM | POA: Diagnosis not present

## 2024-07-07 DIAGNOSIS — R339 Retention of urine, unspecified: Secondary | ICD-10-CM | POA: Diagnosis not present

## 2024-07-07 DIAGNOSIS — M51379 Other intervertebral disc degeneration, lumbosacral region without mention of lumbar back pain or lower extremity pain: Secondary | ICD-10-CM | POA: Diagnosis not present

## 2024-07-07 DIAGNOSIS — E7800 Pure hypercholesterolemia, unspecified: Secondary | ICD-10-CM | POA: Diagnosis not present

## 2024-07-07 DIAGNOSIS — E039 Hypothyroidism, unspecified: Secondary | ICD-10-CM | POA: Diagnosis not present

## 2024-07-07 DIAGNOSIS — Z7901 Long term (current) use of anticoagulants: Secondary | ICD-10-CM | POA: Diagnosis not present

## 2024-07-07 DIAGNOSIS — N182 Chronic kidney disease, stage 2 (mild): Secondary | ICD-10-CM | POA: Diagnosis not present

## 2024-07-07 DIAGNOSIS — G4710 Hypersomnia, unspecified: Secondary | ICD-10-CM | POA: Diagnosis not present

## 2024-07-07 DIAGNOSIS — M48062 Spinal stenosis, lumbar region with neurogenic claudication: Secondary | ICD-10-CM | POA: Diagnosis not present

## 2024-07-07 DIAGNOSIS — G894 Chronic pain syndrome: Secondary | ICD-10-CM | POA: Diagnosis not present

## 2024-07-07 DIAGNOSIS — M47817 Spondylosis without myelopathy or radiculopathy, lumbosacral region: Secondary | ICD-10-CM | POA: Diagnosis not present

## 2024-07-07 DIAGNOSIS — K219 Gastro-esophageal reflux disease without esophagitis: Secondary | ICD-10-CM | POA: Diagnosis not present

## 2024-07-07 DIAGNOSIS — K449 Diaphragmatic hernia without obstruction or gangrene: Secondary | ICD-10-CM | POA: Diagnosis not present

## 2024-07-07 DIAGNOSIS — I129 Hypertensive chronic kidney disease with stage 1 through stage 4 chronic kidney disease, or unspecified chronic kidney disease: Secondary | ICD-10-CM | POA: Diagnosis not present

## 2024-07-10 DIAGNOSIS — Z471 Aftercare following joint replacement surgery: Secondary | ICD-10-CM | POA: Diagnosis not present

## 2024-07-10 DIAGNOSIS — I129 Hypertensive chronic kidney disease with stage 1 through stage 4 chronic kidney disease, or unspecified chronic kidney disease: Secondary | ICD-10-CM | POA: Diagnosis not present

## 2024-07-10 DIAGNOSIS — G894 Chronic pain syndrome: Secondary | ICD-10-CM | POA: Diagnosis not present

## 2024-07-10 DIAGNOSIS — K219 Gastro-esophageal reflux disease without esophagitis: Secondary | ICD-10-CM | POA: Diagnosis not present

## 2024-07-10 DIAGNOSIS — M51379 Other intervertebral disc degeneration, lumbosacral region without mention of lumbar back pain or lower extremity pain: Secondary | ICD-10-CM | POA: Diagnosis not present

## 2024-07-10 DIAGNOSIS — G4710 Hypersomnia, unspecified: Secondary | ICD-10-CM | POA: Diagnosis not present

## 2024-07-10 DIAGNOSIS — H3530 Unspecified macular degeneration: Secondary | ICD-10-CM | POA: Diagnosis not present

## 2024-07-10 DIAGNOSIS — R7303 Prediabetes: Secondary | ICD-10-CM | POA: Diagnosis not present

## 2024-07-10 DIAGNOSIS — K449 Diaphragmatic hernia without obstruction or gangrene: Secondary | ICD-10-CM | POA: Diagnosis not present

## 2024-07-10 DIAGNOSIS — K5790 Diverticulosis of intestine, part unspecified, without perforation or abscess without bleeding: Secondary | ICD-10-CM | POA: Diagnosis not present

## 2024-07-10 DIAGNOSIS — R339 Retention of urine, unspecified: Secondary | ICD-10-CM | POA: Diagnosis not present

## 2024-07-10 DIAGNOSIS — N182 Chronic kidney disease, stage 2 (mild): Secondary | ICD-10-CM | POA: Diagnosis not present

## 2024-07-10 DIAGNOSIS — E7800 Pure hypercholesterolemia, unspecified: Secondary | ICD-10-CM | POA: Diagnosis not present

## 2024-07-10 DIAGNOSIS — Z96641 Presence of right artificial hip joint: Secondary | ICD-10-CM | POA: Diagnosis not present

## 2024-07-10 DIAGNOSIS — M1612 Unilateral primary osteoarthritis, left hip: Secondary | ICD-10-CM | POA: Diagnosis not present

## 2024-07-10 DIAGNOSIS — M47817 Spondylosis without myelopathy or radiculopathy, lumbosacral region: Secondary | ICD-10-CM | POA: Diagnosis not present

## 2024-07-10 DIAGNOSIS — Z7901 Long term (current) use of anticoagulants: Secondary | ICD-10-CM | POA: Diagnosis not present

## 2024-07-10 DIAGNOSIS — Z8673 Personal history of transient ischemic attack (TIA), and cerebral infarction without residual deficits: Secondary | ICD-10-CM | POA: Diagnosis not present

## 2024-07-10 DIAGNOSIS — E039 Hypothyroidism, unspecified: Secondary | ICD-10-CM | POA: Diagnosis not present

## 2024-07-10 DIAGNOSIS — M48062 Spinal stenosis, lumbar region with neurogenic claudication: Secondary | ICD-10-CM | POA: Diagnosis not present

## 2024-07-11 DIAGNOSIS — Z96641 Presence of right artificial hip joint: Secondary | ICD-10-CM | POA: Diagnosis not present

## 2024-07-11 NOTE — Progress Notes (Signed)
 HPI:  Bailey Brooks is a 73 y.o. female who presents for follow-up now 6 weeks status post a right total hip arthroplasty.  Overall, the patient feels that she is doing well.  She still notes some soreness in her hip which she rates at 2/10 on today's visit, and for which she will take an occasional Tylenol  as necessary with relief.  She has recently completed home physical therapy and feels that she may has made good progress with her range of motion, strength, and overall function.  She is no longer ambulating with any assistive devices around the home, but will use a cane when out of the home for balance and support.  She has resumed most of her normal daily activities without too much difficulty.  She is not yet able to reciprocate stairs.  She is sleeping well at night.  She denies any reinjury to the hip, and denies any fevers or chills.  Current Outpatient Medications  Medication Sig Dispense Refill  . acetaminophen  (TYLENOL ) 650 MG ER tablet Take 650 mg by mouth 2 (two) times daily as needed    . ANASTROZOLE  ORAL Take by mouth once daily    . aspirin  325 MG EC tablet Take 325 mg by mouth once daily    . aspirin  81 MG EC tablet Take 1 tablet by mouth once daily    . atorvastatin  (LIPITOR ) 40 MG tablet Take 1 tablet by mouth once daily    . clobetasoL  (TEMOVATE ) 0.05 % cream Apply topically continuously as needed    . diazePAM  (VALIUM ) 2 MG tablet 1-2 30 minutes before MRI 2 tablet 0  . ezetimibe  (ZETIA ) 10 mg tablet     . hydroCHLOROthiazide  (HYDRODIURIL ) 12.5 MG tablet Take 12.5 mg by mouth once daily    . lansoprazole  (PREVACID ) 30 MG DR capsule Take 1 capsule (30 mg total) by mouth once daily. 30 capsule 9  . levothyroxine  (SYNTHROID ) 88 MCG tablet Take 88 mcg by mouth once daily Take on an empty stomach with a glass of water at least 30-60 minutes before breakfast.    . lisinopriL  (ZESTRIL ) 40 MG tablet Take 1 tablet by mouth once daily    . lutein  10 mg Tab Take 1 tablet by mouth once  daily    . mometasone  (ELOCON ) 0.1 % cream 1 Application  2 (two) times daily as needed    . multivitamin tablet Take 1 tablet by mouth once daily    . naproxen  (NAPROSYN ) 500 MG tablet Take 500 mg by mouth once as needed    . tacrolimus  (PROTOPIC ) 0.1 % ointment APPLY TOPICALLY TO THE AFFECTED AREA 1 TO 2 TIMES DAILY AS NEEDED FOR ITCHING     No current facility-administered medications for this visit.   Allergies  Allergen Reactions  . Iodine Swelling    IV iodine  . Morphine Nausea and Hallucination  . Adhesive Rash    Including band-aide  . Erythromycin Abdominal Pain  . Sulfa (Sulfonamide Antibiotics) Unknown   Past Medical History:  Diagnosis Date  . Colon polyp 2014   adenomatous  . Duodenitis 05/21/2014  . Esophagitis   . Hiatal hernia 05/21/2014  . History of chemotherapy   . Hx of adenomatous colonic polyps 01/22/2019  . Lymphoma (CMS/HHS-HCC)    Past Surgical History:  Procedure Laterality Date  . COLONOSCOPY  12/22/2008   Adenomatous Polyp  . UPPER GASTROINTESTINAL ENDOSCOPY  2014  . COLONOSCOPY  03/18/2013   PH Adenomatous Polyp: [facility] 03/2018; Recall Ltr mailed 01/25/2018 (  dh)  . COLONOSCOPY  01/14/2021   Tubular adenoma/PHx CP/Repeat 7yrs/TKT  . EGD  01/14/2021   Normal EGD/Repeat as needed/TKT  . Reverse left total shoulder arthroplasty with biceps tenodesis. Left 11/14/2022   Dr.Poggi  . ARTHROPLASTY HIP TOTAL Right 05/29/2024   Dr. Edie  . ABDOMINAL HYSTERECTOMY    . BREAST BIOPSY    . omayan resevior    . stem cell transplant    . TONSILLECTOMY      Family History  Problem Relation Name Age of Onset  . Gallbladder disease Mother    . Melanoma Father    . Prostate cancer Brother    . Gallbladder disease Brother    . Breast cancer Sister    . Ovarian cancer Neg Hx      Social History   Socioeconomic History  . Marital status: Married  Tobacco Use  . Smoking status: Never  . Smokeless tobacco: Never  Vaping Use  . Vaping status: Never Used   Substance and Sexual Activity  . Alcohol use: No  . Drug use: No  . Sexual activity: Defer   Social Drivers of Health   Financial Resource Strain: Low Risk  (06/30/2024)   Received [facility]   Overall Financial Resource Strain (CARDIA)   . How hard is it for you to pay for the very basics like food, housing, medical care, and heating?: Not very hard  Food Insecurity: No Food Insecurity (06/30/2024)   Received from [facility]   Hunger Vital Sign   . Within the past 12 months, you worried that your food would run out before you got the money to buy more.: Never true   . Within the past 12 months, the food you bought just didn't last and you didn't have money to get more.: Never true  Transportation Needs: No Transportation Needs (06/30/2024)   Received from [facility]   . In the past 12 months, has lack of transportation kept you from medical appointments or from getting medications?: No   . In the past 12 months, has lack of transportation kept you from meetings, work, or from getting things needed for daily living?: No    Review of Systems:  A comprehensive 14 point ROS was performed, reviewed, and the pertinent orthopaedic findings are documented in the HPI.  Exam: Vitals:   07/11/24 1103  BP: 128/78  Weight: (!) 107.5 kg (237 lb)  Height: 160 cm (5' 3)  PainSc:   2  PainLoc: Hip   General/Constitutional: Pleasant significantly overweight elderly female in no acute distress. Neuro/Psych: Normal mood and affect, oriented to person, place and time.  Right hip exam: The patient ambulates with a mild limp, favoring her right leg, but is not using any assistive devices on today's visit.  Skin inspection of the of the right hip demonstrates her surgical incision to be well-healed and without evidence for infection.  She denies any tenderness palpation over the lateral aspect of the hip, but does have some mild tenderness posteriorly in the buttock  area over the area of the ischial tuberosity.  She also has mild-moderate focal tenderness palpation in the left PSIS region.  She is able to arise from a seated position with some difficulty in strength/balance, but without discomfort.  In stance, her pelvis is level.  She can heel raise and toe raise, and is able to march in place with some residual weakness in her hip abductor muscles.  She exhibits  smooth and pain-free motion of her right hip.  She is grossly neurovascularly intact to the right lower extremity and foot.  X-rays/MRI/Lab data:  X-rays of the pelvis and right hip are obtained.  These films demonstrate excellent position of the femoral and acetabular components which are without evidence of loosening.  The femoral head is concentrically located within the acetabulum.  No new acute bony abnormalities are identified.  Assessment: Encounter Diagnoses  Name Primary?  . Status post total hip replacement, right Yes  . Primary osteoarthritis of right hip     Plan: The treatment options were discussed with the patient.  In addition, patient educational materials were provided regarding the diagnosis and treatment options.  Overall, the patient is quite pleased with her symptomatic and functional improvement at this time.  She is offered but declines formal outpatient physical therapy, stating that she feels that she can do the exercises on her own at home.  I have recommended that she continue with her home exercise program and normal daily activities.  She may progress in these activities as symptoms permit, but is to avoid offending activities.  She may take over-the-counter medications as needed for discomfort.  All of the patient's questions and concerns were answered.  She can call any time with further concerns.  She will follow up in 6 weeks for re-evaluation.

## 2024-07-15 ENCOUNTER — Ambulatory Visit: Attending: Cardiology | Admitting: Cardiology

## 2024-07-15 ENCOUNTER — Encounter: Payer: Self-pay | Admitting: Cardiology

## 2024-07-15 VITALS — BP 121/61 | HR 99 | Ht 63.0 in | Wt 240.2 lb

## 2024-07-15 DIAGNOSIS — Z8673 Personal history of transient ischemic attack (TIA), and cerebral infarction without residual deficits: Secondary | ICD-10-CM | POA: Diagnosis not present

## 2024-07-15 DIAGNOSIS — Z6841 Body Mass Index (BMI) 40.0 and over, adult: Secondary | ICD-10-CM

## 2024-07-15 DIAGNOSIS — E782 Mixed hyperlipidemia: Secondary | ICD-10-CM

## 2024-07-15 DIAGNOSIS — R55 Syncope and collapse: Secondary | ICD-10-CM

## 2024-07-15 DIAGNOSIS — I10 Essential (primary) hypertension: Secondary | ICD-10-CM | POA: Diagnosis not present

## 2024-07-15 DIAGNOSIS — E785 Hyperlipidemia, unspecified: Secondary | ICD-10-CM

## 2024-07-15 DIAGNOSIS — I471 Supraventricular tachycardia, unspecified: Secondary | ICD-10-CM | POA: Diagnosis not present

## 2024-07-15 NOTE — Progress Notes (Signed)
 Cardiology Office Note   Date:  07/15/2024  ID:  Bailey Brooks, DOB 1951-02-17, MRN [id number] PCP: Abbey Bruckner, MD   HeartCare Providers Cardiologist:  Deatrice Cage, MD     History of Present Illness Bailey Brooks is a 73 y.o. 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   History of hypertension dates back to at least 2008.  In the setting of abnormal EKG she underwent stress testing in June 2011 which resulted in a low risk study.  Check significant weight gain following rotator cuff repair in July 2016 with subsequent inactivity.  Echocardiogram was completed in December 2017 revealed an LVEF of 60 to 65%, mild LVH, G1 DD, and trivial MR.  March 2018 she was admitted for TIA.  CT and MR were negative for stroke.  She underwent outpatient event monitoring which did not show atrial fibrillation flutter or significant arrhythmia.   Repeat echocardiogram in 03/2022 revealed an LVEF 60 to 65%, no RWMA, mild mitral valve regurgitation.  Outpatient cardiac monitor showed predominant rhythm of sinus with an average rate   She was seen in clinic 1/26 reports that she was doing well with the cardioprotective.  She did recently undergo shoulder surgery continue to be in a sling.  There were no medication changes were made and further testing that was ordered at that time.  She was evaluated in the [facility] emergency department 06/05/2023 after an episode of loss of consciousness.  According to the patient she has a strong history of syncopal episodes was actually in the emergency department.  As per report patient came lightheaded and flushed prior to the syncopal episode.  She initially sat had to be seen but states she was concerned that she was  dehydrated send rechecked in the ED.  She received a liter of normal saline.  Blood work revealed mild renal insufficiency.  She was started on Keflex  as precaution as her urinalysis showed greater than 50 white cells and rare bacteria.  She was subsequently discharged and sent home.   Hospitalized 6/26 - 05/30/2024 for total hip replacement on the right.  She had the diagnoses of primary osteoarthritis of the right hip and went to the operating room for total hip replacement.  She tolerated surgery well there were no immediate postoperative complications.  She was started on apixaban  2.5 mg every 12 hours.  Physical therapy started on day 1 for gait training and transfer and occupational started on day 1 for ADLs and assistive devices.  Aspirin  81 mg daily was stopped when she was placed on apixaban  for DVT prophylaxis.  She was seen in clinic 01/02/2024 stating that she had been doing fairly well.  She was extremely tired due to sitting in the emergency department with her husband pending admission.  He states that her PCP had discontinued her amlodipine  and started her on lisinopril .  There were no further medication changes that were made or testing that was ordered.  She returns to clinic today stating for the cardiac perspective she has been doing well.  She denies any chest pain, palpitations, lightheadedness dizziness.  She occasionally has exertional dyspnea when walking for extended distances.  She states this is unchanged and is better than prior to her hip surgery.  She states she was evaluated by urgent care for UTI, the  emergency department for urinary retention, and her PCP.  She has been on several rounds of antibiotics and a Foley catheter placed which was removed and now she has an upcoming appointment with urology.  She states that she has been compliant with her current medication regimen with any undue side effects.  ROS: 10 point review of systems has been reviewed and considered negative  except ones been listed in HPI  Studies Reviewed     2D echo 03/08/2022 1. Left ventricular ejection fraction, by estimation, is 60 to 65%. The  left ventricle has normal function. The left ventricle has no regional  wall motion abnormalities. Left ventricular diastolic parameters were  normal. The average left ventricular  global longitudinal strain is -18.9 %. The global longitudinal strain is  normal.   2. Right ventricular systolic function is normal. The right ventricular  size is normal.   3. The mitral valve is normal in structure. Mild mitral valve  regurgitation.   4. The aortic valve was not well visualized. Aortic valve regurgitation  is not visualized. Aortic valve sclerosis is present, with no evidence of  aortic valve stenosis.   5. The inferior vena cava is normal in size with greater than 50%  respiratory variability, suggesting right atrial pressure of 3 mmHg.  Risk Assessment/Calculations           Physical Exam VS:  BP 121/61 (BP Location: Left Arm, Patient Position: Sitting, Cuff Size: Normal)   Pulse 99   Ht 5' 3 (1.6 m)   Wt 240 lb 3.2 oz (109 kg)   SpO2 98%   BMI 42.55 kg/m        Wt Readings from Last 3 Encounters:  07/15/24 240 lb 3.2 oz (109 kg)  07/01/24 237 lb 6.4 oz (107.7 kg)  05/31/24 244 lb (110.7 kg)    GEN: Well nourished, well developed in no acute distress NECK: No JVD; No carotid bruits CARDIAC: RRR, II/VI systolic murmur R/LUSB, without rubs or gallops RESPIRATORY:  Clear to auscultation without rales, wheezing or rhonchi  ABDOMEN: Soft, non-tender, obese, non-distended EXTREMITIES:  No edema; No deformity   ASSESSMENT AND PLAN Primary hypertension with a blood pressure today 121/61.  Blood pressure has remained stable.  She is continued on HCTZ 12.5 mg daily and lisinopril  40 mg daily.  She has been encouraged to continue to monitor her pressures 1 to 2 hours postmedication administration as well.  Hyperlipidemia with an LDL 46  that remains at goal.  She is continued on atorvastatin  80 mg daily and ezetimibe  10 mg daily.  Ongoing management per PCP.  Recurrent TIA with no evidence of cardiac source.  Echocardiogram was previously unremarkable.  Previously declined beta-blocker therapy or implantation of loop recorder.  She is continued on aspirin  81 mg daily, atorvastatin  80 mg daily, and ezetimibe  10 mg daily.  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  She has not had any recurrence as of late.  Typically related to UTI which she has recently had 1 required IV fluids.  She does have a history of SVT.  Last EKG revealed sinus rhythm.  Currently she remains asymptomatic with no further events noted.  Morbid obesity with a BMI of 42.55.  Activity is limited due to recent hip pain and chronic back pain.  She would benefit from weight loss.       Dispo: Patient to return to clinic to see MD/APP in 6 months or sooner if  needed for further evaluation.  Signed, Yesli Vanderhoff, NP

## 2024-07-15 NOTE — Patient Instructions (Signed)
 Medication Instructions:  The current medical regimen is effective;  continue present plan and medications as directed. Please refer to the Current Medication list given to you today.   *If you need a refill on your cardiac medications before your next appointment, please call your pharmacy*  Follow-Up: At [facility], you and your health needs are our priority.  As part of our continuing mission to provide you with exceptional heart care, our providers are all part of one team.  This team includes your primary Cardiologist (physician) and Advanced Practice Providers or APPs (Physician Assistants and Nurse Practitioners) who all work together to provide you with the care you need, when you need it.  Your next appointment:   6 month(s)  Provider:   You may see Deatrice Cage, MD or one of the following Advanced Practice Providers on your designated Care Team:   Lonni Meager, NP Lesley Maffucci, PA-C Bernardino Bring, PA-C Cadence Stillmore, PA-C Tylene Lunch, NP Barnie Hila, NP    We recommend signing up for the patient portal called MyChart.  Sign up information is provided on this After Visit Summary.  MyChart is used to connect with patients for Virtual Visits (Telemedicine).  Patients are able to view lab/test results, encounter notes, upcoming appointments, etc.  Non-urgent messages can be sent to your provider as well.   To learn more about what you can do with MyChart, go to [email or web address].

## 2024-07-18 ENCOUNTER — Ambulatory Visit (INDEPENDENT_AMBULATORY_CARE_PROVIDER_SITE_OTHER): Admitting: Podiatry

## 2024-07-18 DIAGNOSIS — Z91199 Patient's noncompliance with other medical treatment and regimen due to unspecified reason: Secondary | ICD-10-CM

## 2024-07-18 NOTE — Progress Notes (Signed)
 1. No-show for appointment

## 2024-07-25 ENCOUNTER — Ambulatory Visit: Admitting: Podiatry

## 2024-07-25 ENCOUNTER — Encounter: Payer: Self-pay | Admitting: Podiatry

## 2024-07-25 DIAGNOSIS — B351 Tinea unguium: Secondary | ICD-10-CM | POA: Diagnosis not present

## 2024-07-25 DIAGNOSIS — M79675 Pain in left toe(s): Secondary | ICD-10-CM

## 2024-07-25 DIAGNOSIS — M79674 Pain in right toe(s): Secondary | ICD-10-CM

## 2024-07-29 ENCOUNTER — Encounter: Payer: Self-pay | Admitting: Podiatry

## 2024-07-29 NOTE — Progress Notes (Signed)
  Subjective:  Patient ID: Bailey Brooks, female    DOB: 05-11-1951,  MRN: [id number]  Bailey Brooks presents to clinic today for painful thick toenails that are difficult to trim. Pain interferes with ambulation. Aggravating factors include wearing enclosed shoe gear. Pain is relieved with periodic professional debridement.  Chief Complaint  Patient presents with   [facility]    [facility]/ Dr. Luke Shade last visit July 01 2024   New problem(s): None.   PCP is Bair, Luke, MD.  Allergies  Allergen Reactions   Morphine And Codeine Nausea And Vomiting and Nausea Only    Hallucinations   Sulfa Antibiotics Other (See Comments)    Dizzy/Fainting   Adhesive [Tape] Rash and Other (See Comments)    Including Bandaids-paper tape ok to use   Erythromycin Nausea And Vomiting   Oxycodone  Nausea And Vomiting    Review of Systems: Negative except as noted in the HPI.  Objective: No changes noted in today's physical examination. There were no vitals filed for this visit. Bailey Brooks is a pleasant 73 y.o. female obese in NAD. AAO x 3.  Vascular Examination: Capillary refill time immediate b/l. Palpable pedal pulses. Pedal hair present b/l. Pedal edema absent. No pain with calf compression b/l. Skin temperature gradient WNL b/l. No cyanosis or clubbing b/l. No ischemia or gangrene noted b/l.   Neurological Examination: Sensation grossly intact b/l with 10 gram monofilament. Vibratory sensation intact b/l.   Dermatological Examination: Pedal skin with normal turgor, texture and tone b/l.  No open wounds. No interdigital macerations.   Toenails 1-5 b/l thick, discolored, elongated with subungual debris and pain on dorsal palpation.   No hyperkeratotic nor porokeratotic lesions.  Musculoskeletal Examination: Muscle strength 5/5 to all lower extremity muscle groups bilaterally. No pain, crepitus or joint limitation noted with ROM b/l LE. No gross bony pedal deformities  b/l. Patient ambulates independently without assistive aids.  Radiographs: None  Last A1c:      Latest Ref Rng & Units 03/05/2024   11:45 AM  Hemoglobin A1C  Hemoglobin-A1c 4.6 - 6.5 % 5.9    Assessment/Plan: 1. Pain due to onychomycosis of toenails of both feet   Patient was evaluated and treated. All patient's and/or POA's questions/concerns addressed on today's visit. Mycotic toenails 1-5 debrided in length and girth without incident. Continue soft, supportive shoe gear daily. Report any pedal injuries to medical professional. Call office if there are any quesitons/concerns. -Patient/POA to call should there be question/concern in the interim.   Return in about 3 months (around 10/25/2024).  Bailey Brooks, DPM      [place] LOCATION: [address] [place] [address]                   Office [phone]   [facility] LOCATION: [address] [place], [place] [address] Office [phone]

## 2024-07-30 ENCOUNTER — Ambulatory Visit: Admitting: Urology

## 2024-07-30 ENCOUNTER — Encounter: Payer: Self-pay | Admitting: Urology

## 2024-07-30 VITALS — BP 144/79 | HR 57 | Ht 63.0 in | Wt 240.0 lb

## 2024-07-30 DIAGNOSIS — N3000 Acute cystitis without hematuria: Secondary | ICD-10-CM | POA: Diagnosis not present

## 2024-07-30 DIAGNOSIS — R8271 Bacteriuria: Secondary | ICD-10-CM

## 2024-07-30 LAB — BLADDER SCAN AMB NON-IMAGING: Scan Result: 32

## 2024-07-30 NOTE — Progress Notes (Signed)
 07/30/2024 10:28 AM   Bailey Brooks 12-Oct-1951 [id number]  Referring provider: Abbey Bruckner, MD [address] [place],  [place] [address]  Chief Complaint  Patient presents with   Hematuria    HPI: Bailey Brooks is a 73 y.o. female referred for evaluation of recurrent UTI.  On chart review has had several urinalysis showing pyuria however she has been asymptomatic. She had hip replacement in late June 2025 and was discharged on 05/30/2024. She had onset of acute urinary retention and was seen in the ED 05/31/2024.  Her catheter was indwelling for 1 week and then she developed hematuria.  She went back to the ED and her catheter was removed.  She was also treated with antibiotics for possible infection though a urine culture was not ordered. She has been asymptomatic since her catheter was removed.  She had a follow-up visit with her PCP on 07/01/2024.  She was asymptomatic at that time.  Urinalysis did show pyuria and urine culture grew E. coli.  She was treated with a course of antibiotics. She has no complaints today.   PMH: Past Medical History:  Diagnosis Date   Adult pulmonary Langerhans cell histiocytosis (HCC)    Eosinophilic Granuloma of the Lung)   Allergic rhinitis    Antineoplastic chemotherapy induced anemia    Arthritis    Breast cancer, right (HCC) 09/17/2019   a.) Bx (+) stage 1 IMC (G1, ER/PR +, Her2/neu -); pT1a N0 M0; Tx'd with adjuvant XRT + endocrine therapy   Bruising 12/14/2017   Complication of anesthesia    pt was awake during intubation for colonoscopy   Diastolic dysfunction    a.) TTE 11/14/2016: EF 60-65%, no rwma, mild LVH, triv MR, mildly dil LA, nl RVSF, G1DD; b. 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   Tubular adenoma of colon     Surgical History: Past Surgical History:  Procedure Laterality Date   ABDOMINAL HYSTERECTOMY  1985   Hysterectomy-partial  BREAST BIOPSY Right 2002   Neg - AT [facility]   BREAST BIOPSY Right 2020   bx done at [facility]?, IDC and DCIS   BREAST LUMPECTOMY Right 10/16/2019   IDC and DCIS, negative LN   BURR HOLE W/ PLACEMENT OMMAYA RESERVOIR     BURR HOLE W/ PLACEMENT OMMAYA RESERVOIR  2012   COLONOSCOPY  04/2013   ESOPHAGOGASTRODUODENOSCOPY  04/2013   INJECTION KNEE  07/02/2018   INSERTION CENTRAL VENOUS ACCESS DEVICE W/ SUBCUTANEOUS PORT  2011   Port a Cath: Right chest Double Lumen, 04-Nov-2010   LIMBAL STEM CELL TRANSPLANT  2012   LIVER BIOPSY     stage 4B large Bcell lymphoma   PARTIAL MASTECTOMY WITH NEEDLE LOCALIZATION AND AXILLARY SENTINEL LYMPH NODE BX  Right 10/16/2019   Procedure: PARTIAL MASTECTOMY WITH NEEDLE LOCALIZATION AND AXILLARY SENTINEL LYMPH NODE BX;  Surgeon: Tye Millet, DO;  Location: [facility] ORS;  Service: General;  Laterality: Right;   REVERSE SHOULDER ARTHROPLASTY Left 11/14/2022   Procedure: REVERSE SHOULDER ARTHROPLASTY WITH BICEPS TENODESIS.;  Surgeon: Edie Norleen PARAS, MD;  Location: [facility] ORS;  Service: Orthopedics;  Laterality: Left;   ROTATOR CUFF REPAIR Right 2016   SHOULDER ARTHROSCOPY Right 06/22/2015   Procedure: ARTHROSCOPY SHOULDER, parital repair of rotator cuff, biceps tenodesis, decompression and debridement;  Surgeon: Norleen PARAS Edie, MD;  Location: [facility] ORS;  Service: Orthopedics;  Laterality: Right;   TONSILLECTOMY  1954   TOTAL HIP ARTHROPLASTY Right 05/29/2024   Procedure: ARTHROPLASTY, HIP, TOTAL,POSTERIOR APPROACH;  Surgeon: Edie Norleen PARAS, MD;  Location: [facility] ORS;  Service: Orthopedics;  Laterality: Right;   VULVECTOMY     VULVECTOMY PARTIAL N/A 09/29/2015   Procedure: VULVECTOMY PARTIAL;  Surgeon: Webb Isidor Constable, MD;  Location: [facility] ORS;  Service: Gynecology;  Laterality: N/A;    Home Medications:  Allergies as of 07/30/2024       Reactions   Morphine And Codeine Nausea And Vomiting, Nausea Only   Hallucinations   Sulfa Antibiotics Other (See Comments)   Dizzy/Fainting   Adhesive [tape] Rash, Other (See Comments)   Including Bandaids-paper tape ok to use   Erythromycin Nausea And Vomiting   Oxycodone  Nausea And Vomiting        Medication List        Accurate as of July 30, 2024 10:28 AM. If you have any questions, ask your nurse or doctor.          acetaminophen  650 MG CR tablet Commonly known as: TYLENOL  Take 1,300 mg by mouth in the morning and at bedtime.   anastrozole  1 MG tablet Commonly known as: ARIMIDEX  TAKE 1 TABLET BY MOUTH DAILY   atorvastatin  80 MG tablet Commonly known as: LIPITOR  TAKE 1 TABLET BY MOUTH DAILY   CALCIUM  PO Take 1 tablet by mouth daily.    cholecalciferol  25 MCG (1000 UNIT) tablet Commonly known as: VITAMIN D3 Take 1,000 Units by mouth daily.   ezetimibe  10 MG tablet Commonly known as: ZETIA  TAKE 1 TABLET BY MOUTH DAILY   hydrochlorothiazide  12.5 MG tablet Commonly known as: HYDRODIURIL  Take 1 tablet (12.5 mg total) by mouth daily.   ICAPS LUTEIN  & ZEAXANTHIN PO Take 1 tablet by mouth every morning. Zeaxanthin 4mg /Lutein  10mg    ketoconazole  2 % cream Commonly known as: NIZORAL  Apply twice daily as directed. What changed:  how much to take when to take this reasons to take this   lansoprazole  30 MG capsule Commonly known as: PREVACID  TAKE 1 CAPSULE BY MOUTH ONCE DAILY   levothyroxine   88 MCG tablet Commonly known as: SYNTHROID  TAKE 1 TABLET BY MOUTH ONCE DAILY. TAKE ON EMPTY STOMACH WITH A GLASS OF WATER AT LEAST 30-60 MINUTES BEFORE BREAKFAST   lisinopril  40 MG tablet Commonly known as: ZESTRIL  Take 1 tablet (40 mg total) by mouth daily.   mometasone  0.1 % cream Commonly known as: ELOCON  APPLY TO ITCHY SPOTS ON BODY 1-2 TIMES DAILY UNTIL IMPROVED; AVOID FACE, GROIN,AND UNDERARMS What changed:  how much to take how to take this when to take this reasons to take this   multivitamin tablet Take 1 tablet by mouth daily.   tacrolimus  0.1 % ointment Commonly known as: PROTOPIC  Apply 1-2 times daily as needed for itch to affected areas.        Allergies:  Allergies  Allergen Reactions   Morphine And Codeine Nausea And Vomiting and Nausea Only    Hallucinations   Sulfa Antibiotics Other (See Comments)    Dizzy/Fainting   Adhesive [Tape] Rash and Other (See Comments)    Including Bandaids-paper tape ok to use   Erythromycin Nausea And Vomiting   Oxycodone  Nausea And Vomiting    Family History: Family History  Problem Relation Age of Onset   Diabetes Mother        died @ 48 of MI.   Hypertension Mother    Heart attack Mother    Melanoma Father 40       died of complications r/t  melanoma w/ lung mets.   Kidney disease Sister        Kidney removed    Breast cancer Sister 39   Prostate cancer Brother        Prostate - dx in 73's   Heart disease Brother 108       reported MI @ age 25, ? treated w/ TPA->no recurrent CAD, now in 42's.   Hearing loss Maternal Aunt    Cancer Maternal Aunt        pancreatic vs colon cancer   Hearing loss Maternal Grandfather    Other Maternal Grandmother        flu pandemic   Kidney cancer Niece 58       partial nephrectomy    Social History:  reports that she has never smoked. She has never used smokeless tobacco. She reports that she does not drink alcohol and does not use drugs.   Physical Exam: BP (!) 144/79   Pulse (!) 57   Ht 5' 3 (1.6 m)   Wt 240 lb (108.9 kg)   BMI 42.51 kg/m   Constitutional:  Alert, No acute distress. HEENT: [place] AT Respiratory: Normal respiratory effort, no increased work of breathing. Psychiatric: Normal mood and affect.   Assessment & Plan:    1.  History urinary retention She voided prior to coming to the office and was unable to give a sample. Bladder scan for volume was 32 mL  2.  Asymptomatic bacteriuria Treatment of asymptomatic bacteriuria is not recommended Follow-up as needed for bothersome lower urinary tract symptoms or symptomatic UTI    Glendia JAYSON Barba, MD  [facility] [address] [place], [place] [address] [phone]

## 2024-08-11 ENCOUNTER — Ambulatory Visit (INDEPENDENT_AMBULATORY_CARE_PROVIDER_SITE_OTHER): Admitting: *Deleted

## 2024-08-11 VITALS — Ht 63.0 in | Wt 240.0 lb

## 2024-08-11 DIAGNOSIS — Z0000 Encounter for general adult medical examination without abnormal findings: Secondary | ICD-10-CM

## 2024-08-11 NOTE — Progress Notes (Signed)
 Subjective:   Bailey Brooks is a 73 y.o. who presents for a Medicare Wellness preventive visit.  As a reminder, Annual Wellness Visits don't include a physical exam, and some assessments may be limited, especially if this visit is performed virtually. We may recommend an in-person follow-up visit with your provider if needed.  Visit Complete: Virtual I connected with  Bailey Brooks on 08/11/24 by a audio enabled telemedicine application and verified that I am speaking with the correct person using two identifiers.  Patient Location: Home  Provider Location: Home Office  I discussed the limitations of evaluation and management by telemedicine. The patient expressed understanding and agreed to proceed.  Vital Signs: Because this visit was a virtual/telehealth visit, some criteria may be missing or patient reported. Any vitals not documented were not able to be obtained and vitals that have been documented are patient reported.  VideoError- Librarian, academic were attempted between this provider and patient, however failed, due to patient having technical difficulties OR patient did not have access to video capability.  We continued and completed visit with audio only.   Persons Participating in Visit: Patient.  AWV Questionnaire: No: Patient Medicare AWV questionnaire was not completed prior to this visit.  Cardiac Risk Factors include: advanced age (>21men, >34 women);hypertension;dyslipidemia;obesity (BMI >30kg/m2);sedentary lifestyle     Objective:    Today's Vitals   08/11/24 1529  Weight: 240 lb (108.9 kg)  Height: 5' 3 (1.6 m)   Body mass index is 42.51 kg/m.     08/11/2024    3:54 PM 06/07/2024   12:47 PM 05/31/2024    2:38 PM 05/29/2024    8:13 AM 05/21/2024   10:29 AM 05/07/2024   10:39 AM 12/27/2023    1:03 PM  Advanced Directives  Does Patient Have a Medical Advance Directive? Yes No No Yes Yes Yes Yes  Type of Special educational needs teacher of [place];Living will Healthcare Power of [place];Living will  Healthcare Power of [place];Living will Living will;Healthcare Power of Attorney Living will;Healthcare Power of [employer] Power of [place];Living will  Does patient want to make changes to medical advance directive?   No - Patient declined No - Patient declined  Yes (ED - Information included in AVS)   Copy of Healthcare Power of Attorney in Chart? No - copy requested No - copy requested  No - copy requested       Current Medications (verified) Outpatient Encounter Medications as of 08/11/2024  Medication Sig   acetaminophen  (TYLENOL ) 650 MG CR tablet Take 1,300 mg by mouth in the morning and at bedtime.   anastrozole  (ARIMIDEX ) 1 MG tablet TAKE 1 TABLET BY MOUTH DAILY   atorvastatin  (LIPITOR ) 80 MG tablet TAKE 1 TABLET BY MOUTH DAILY   CALCIUM  PO Take 1 tablet by mouth daily.   cholecalciferol  (VITAMIN D3) 25 MCG (1000 UT) tablet Take 1,000 Units by mouth daily.   ezetimibe  (ZETIA ) 10 MG tablet TAKE 1 TABLET BY MOUTH DAILY   hydrochlorothiazide  (HYDRODIURIL ) 12.5 MG tablet Take 1 tablet (12.5 mg total) by mouth daily.   ketoconazole  (NIZORAL ) 2 % cream Apply twice daily as directed.   lansoprazole  (PREVACID ) 30 MG capsule TAKE 1 CAPSULE BY MOUTH ONCE DAILY   levothyroxine  (SYNTHROID ) 88 MCG tablet TAKE 1 TABLET BY MOUTH ONCE DAILY. TAKE ON EMPTY STOMACH WITH A GLASS OF WATER AT LEAST 30-60 MINUTES BEFORE BREAKFAST   lisinopril  (ZESTRIL ) 40 MG tablet Take 1 tablet (40 mg total) by mouth  daily.   mometasone  (ELOCON ) 0.1 % cream APPLY TO ITCHY SPOTS ON BODY 1-2 TIMES DAILY UNTIL IMPROVED; AVOID FACE, GROIN,AND UNDERARMS (Patient taking differently: APPLY TO ITCHY SPOTS ON BODY 1-2 TIMES DAILY UNTIL IMPROVED; AVOID FACE, GROIN,AND UNDERARMS AS NEEDED)   Multiple Vitamin (MULTIVITAMIN) tablet Take 1 tablet by mouth daily.   Specialty Vitamins Products (ICAPS LUTEIN  & ZEAXANTHIN PO) Take 1 tablet by mouth  every morning. Zeaxanthin 4mg /Lutein  10mg    tacrolimus  (PROTOPIC ) 0.1 % ointment Apply 1-2 times daily as needed for itch to affected areas.   No facility-administered encounter medications on file as of 08/11/2024.    Allergies (verified) Morphine and codeine, Sulfa antibiotics, Adhesive [tape], Erythromycin, and Oxycodone    History: Past Medical History:  Diagnosis Date   Adult pulmonary Langerhans cell histiocytosis (HCC)    Eosinophilic Granuloma of the Lung)   Allergic rhinitis    Antineoplastic chemotherapy induced anemia    Arthritis    Breast cancer, right (HCC) 09/17/2019   a.) Bx (+) stage 1 IMC (G1, ER/PR +, Her2/neu -); pT1a N0 M0; Tx'd with adjuvant XRT + endocrine therapy   Bruising 12/14/2017   Complication of anesthesia    pt was awake during intubation for colonoscopy   Diastolic dysfunction    a.) TTE 11/14/2016: EF 60-65%, no rwma, mild LVH, triv MR, mildly dil LA, nl RVSF, G1DD; b. 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   Tubular adenoma of colon    Past Surgical History:  Procedure Laterality Date   ABDOMINAL HYSTERECTOMY  1985   Hysterectomy-partial   BREAST BIOPSY Right 2002   Neg - AT [facility]   BREAST BIOPSY Right 2020   bx done at [facility]?, IDC and DCIS   BREAST LUMPECTOMY Right 10/16/2019   IDC and DCIS, negative LN   BURR HOLE W/ PLACEMENT OMMAYA RESERVOIR     BURR HOLE W/ PLACEMENT OMMAYA RESERVOIR  2012   COLONOSCOPY  04/2013   ESOPHAGOGASTRODUODENOSCOPY  04/2013   INJECTION  KNEE  07/02/2018   INSERTION CENTRAL VENOUS ACCESS DEVICE W/ SUBCUTANEOUS PORT  2011   Port a Cath: Right chest Double Lumen, 04-Nov-2010   LIMBAL STEM CELL TRANSPLANT  2012   LIVER BIOPSY     stage 4B large Bcell lymphoma   PARTIAL MASTECTOMY WITH NEEDLE LOCALIZATION AND AXILLARY SENTINEL LYMPH NODE BX Right 10/16/2019   Procedure: PARTIAL MASTECTOMY WITH NEEDLE LOCALIZATION AND AXILLARY SENTINEL LYMPH NODE BX;  Surgeon: Tye Millet, DO;  Location: [facility] ORS;  Service: General;  Laterality: Right;   REVERSE SHOULDER ARTHROPLASTY Left 11/14/2022   Procedure: REVERSE SHOULDER ARTHROPLASTY WITH BICEPS TENODESIS.;  Surgeon: Edie Norleen PARAS, MD;  Location: [facility] OR[facility];  Service: Orthopedics;  Laterality: Left;   ROTATOR CUFF REPAIR Right 2016   SHOULDER ARTHROSCOPY Right 06/22/2015   Procedure: ARTHROSCOPY SHOULDER, parital repair of rotator cuff, biceps tenodesis, decompression and debridement;  Surgeon: Norleen PARAS Edie, MD;  Location: [facility] OR[facility];  Service: Orthopedics;  Laterality: Right;   TONSILLECTOMY  1954   TOTAL HIP ARTHROPLASTY Right  05/29/2024   Procedure: ARTHROPLASTY, HIP, TOTAL,POSTERIOR APPROACH;  Surgeon: Edie Norleen PARAS, MD;  Location: [facility];  Service: Orthopedics;  Laterality: Right;   VULVECTOMY     VULVECTOMY PARTIAL N/A 09/29/2015   Procedure: VULVECTOMY PARTIAL;  Surgeon: Webb Isidor Constable, MD;  Location: [facility] OR[facility];  Service: Gynecology;  Laterality: N/A;   Family History  Problem Relation Age of Onset   Diabetes Mother        died @ 60 of MI.   Hypertension Mother    Heart attack Mother    Melanoma Father 44       died of complications r/t melanoma w/ lung mets.   Kidney disease Sister        Kidney removed    Breast cancer Sister 7   Prostate cancer Brother        Prostate - dx in 53's   Heart disease Brother 72       reported MI @ age 66, ? treated w/ TPA->no recurrent CAD, now in 28's.   Hearing loss Maternal Aunt    Cancer Maternal Aunt        pancreatic vs colon cancer   Hearing loss Maternal Grandfather    Other Maternal Grandmother        flu pandemic   Kidney cancer Niece 8       partial nephrectomy   Social History   Socioeconomic History   Marital status: Married    Spouse name: Not on file   Number of children: 2   Years of education: Not on file   Highest education level: Bachelor's degree (e.g., BA, AB, BS)  Occupational History   Occupation: Pharmacologist Records, [employer]    Employer: [employer]  Tobacco Use   Smoking status: Never   Smokeless tobacco: Never  Vaping Use   Vaping status: Never Used  Substance and Sexual Activity   Alcohol use: No    Alcohol/week: 0.0 standard drinks of alcohol   Drug use: No   Sexual activity: Not Currently    Partners: Male    Birth control/protection: Post-menopausal  Other Topics Concern   Not on file  Social History Narrative   Lives in [place] with husband, has 2 grown sons.  No pets.  Retired from [employer].  Activity limited by chronic back pain-sedentary.   Right-handed   Caffeine: occasional  caffeine free/diet soda or hot tea   Social Drivers of Dispensing optician  Resource Strain: Low Risk  (08/11/2024)   Overall Financial Resource Strain (CARDIA)    Difficulty of Paying Living Expenses: Not hard at all  Food Insecurity: No Food Insecurity (08/11/2024)   Hunger Vital Sign    Worried About Running Out of Food in the Last Year: Never true    Ran Out of Food in the Last Year: Never true  Transportation Needs: No Transportation Needs (08/11/2024)   PRAPARE - Administrator, Civil Service (Medical): No    Lack of Transportation (Non-Medical): No  Physical Activity: Inactive (08/11/2024)   Exercise Vital Sign    Days of Exercise per Week: 0 days    Minutes of Exercise per Session: 0 min  Stress: No Stress Concern Present (08/11/2024)   [employer] of Occupational Health - Occupational Stress Questionnaire    Feeling of Stress: Only a little  Social Connections: Moderately Integrated (08/11/2024)   Social Connection and Isolation Panel    Frequency of Communication with Friends and Family: More than three times a week    Frequency of Social Gatherings with Friends and Family: Once a week    Attends Religious Services: More than 4 times per year    Active Member of [employer] or Organizations: No    Attends Engineer, structural: Never    Marital Status: Married    Tobacco Counseling Counseling given: Not Answered    Clinical Intake:  Pre-visit preparation completed: Yes  Pain : No/denies pain     BMI - recorded: 42.51 Nutritional Status: BMI > 30  Obese Nutritional Risks: None Diabetes: No  Lab Results  Component Value Date   HGBA1C 5.9 03/05/2024   HGBA1C 5.8 (A) 01/26/2023   HGBA1C 5.7 (A) 11/09/2022     How often do you need to have someone help you when you read instructions, pamphlets, or other written materials from your doctor or pharmacy?: 1 - Never  Interpreter Needed?: No  Information entered by :: R. Heide Brossart LPN   Activities of  Daily Living     08/11/2024    3:40 PM 08/11/2024    3:34 PM  In your present state of health, do you have any difficulty performing the following activities:  Hearing? 0 1  Vision? 0 0  Difficulty concentrating or making decisions? 0 0  Walking or climbing stairs? 1 0  Dressing or bathing? 0 0  Doing errands, shopping? 0 0  Preparing Food and eating ? N N  Using the Toilet? N N  In the past six months, have you accidently leaked urine? N N  Do you have problems with loss of bowel control? N N  Managing your Medications? N N  Managing your Finances? N N  Housekeeping or managing your Housekeeping? N N    Patient Care Team: Bair, Kalpana, MD as PCP - General (Family Medicine) Darron Deatrice LABOR, MD as PCP - Cardiology (Cardiology) Maurie Rayfield BIRCH, RN as Registered Nurse Brahmanday, Govinda R, MD as Consulting Physician (Oncology) Twylla Glendia BROCKS, MD (Urology)  I have updated your Care Teams any recent Medical Services you may have received from other providers in the past year.     Assessment:   This is a routine wellness examination for [place].  Hearing/Vision screen Hearing Screening - Comments:: Some issues Vision Screening - Comments:: glasses   Goals Addressed             This Visit's Progress    Patient Stated       Wants  to be able to walk without hip pain and wants to lose some weight       Depression Screen     08/11/2024    3:47 PM 07/01/2024   10:03 AM 03/05/2024   11:01 AM 12/28/2023   10:36 AM 12/13/2023    9:36 AM 12/04/2023   12:59 PM 10/01/2023   11:26 AM  PHQ 2/9 Scores  PHQ - 2 Score 0 0 0 0 0 0 0  PHQ- 9 Score 3 2 2 1        Fall Risk     08/11/2024    3:42 PM 07/01/2024   10:04 AM 03/05/2024   11:01 AM 12/28/2023   10:36 AM 12/27/2023    1:04 PM  Fall Risk   Falls in the past year? 0 0 0 0 0  Number falls in past yr: 0 0 0 0   Injury with Fall? 0 0 0 0   Risk for fall due to : No Fall Risks No Fall Risks No Fall Risks No Fall Risks    Follow up Falls evaluation completed;Falls prevention discussed Falls evaluation completed Falls evaluation completed Falls evaluation completed     MEDICARE RISK AT HOME:  Medicare Risk at Home Any stairs in or around the home?: Yes If so, are there any without handrails?: No Home free of loose throw rugs in walkways, pet beds, electrical cords, etc?: No (discussed safety) Adequate lighting in your home to reduce risk of falls?: Yes Life alert?: No Use of a cane, walker or w/c?: Yes Grab bars in the bathroom?: Yes Shower chair or bench in shower?: Yes Elevated toilet seat or a handicapped toilet?: Yes  TIMED UP AND GO:  Was the test performed?  No  Cognitive Function: 6CIT completed        08/11/2024    3:54 PM 04/26/2023    9:11 AM  6CIT Screen  What Year? 0 points 0 points  What month? 0 points 0 points  What time? 0 points 0 points  Count back from 20 0 points 0 points  Months in reverse 0 points 0 points  Repeat phrase 0 points 0 points  Total Score 0 points 0 points    Immunizations Immunization History  Administered Date(s) Administered   Fluad Quad(high Dose 65+) 09/08/2022   HIB (PRP-OMP) 10/11/2012   Hepatitis B 10/11/2012   INFLUENZA, HIGH DOSE SEASONAL PF 09/21/2021, 09/07/2022   IPV 10/11/2012   Influenza Split 09/04/2011, 08/14/2012, 09/23/2014   Influenza, Seasonal, Injecte, Preservative Fre 08/24/2023   Influenza,inj,Quad PF,6+ Mos 08/16/2016, 08/20/2019   Influenza-Unspecified 09/25/2017, 09/24/2018, 08/18/2020   Moderna Covid-19 Fall Seasonal Vaccine 2yrs & older 09/08/2022, 07/24/2023   Moderna Covid-19 Vaccine Bivalent Booster 62yrs & up 09/09/2021, 09/30/2021   PFIZER(Purple Top)SARS-COV-2 Vaccination 01/30/2020, 02/24/2020, 07/25/2020, 03/11/2021   Pneumococcal Conjugate-13 12/20/2018   Pneumococcal Polysaccharide-23 03/07/2015   Td 01/09/2024   Tdap 10/06/2012   Unspecified SARS-COV-2 Vaccination 09/07/2022   Zoster Recombinant(Shingrix)  07/10/2022, 10/03/2022    Screening Tests Health Maintenance  Topic Date Due   Medicare Annual Wellness (AWV)  04/25/2024   Influenza Vaccine  07/04/2024   COVID-19 Vaccine (8 - 2025-26 season) 08/04/2024   MAMMOGRAM  02/10/2025   Colonoscopy  01/14/2031   DTaP/Tdap/Td (3 - Td or Tdap) 01/08/2034   Pneumococcal Vaccine: 50+ Years  Completed   DEXA SCAN  Completed   Hepatitis C Screening  Completed   Zoster Vaccines- Shingrix  Completed   HPV VACCINES  Aged Out  Meningococcal B Vaccine  Aged Out   Hepatitis B Vaccines 19-59 Average Risk  Discontinued    Health Maintenance Items Addressed: Discussed the need to update flu and covid vaccines which patient stated that she will get at her pharmacy  Additional Screening:  Vision Screening: Recommended annual ophthalmology exams for early detection of glaucoma and other disorders of the eye. Is the patient up to date with their annual eye exam?  Yes  Who is the provider or what is the name of the office in which the patient attends annual eye exams? [facility]  Dental Screening: Recommended annual dental exams for proper oral hygiene  Community Resource Referral / Chronic Care Management: CRR required this visit?  No   CCM required this visit?  No   Plan:    I have personally reviewed and noted the following in the patient's chart:   Medical and social history Use of alcohol, tobacco or illicit drugs  Current medications and supplements including opioid prescriptions. Patient is not currently taking opioid prescriptions. Functional ability and status Nutritional status Physical activity Advanced directives List of other physicians Hospitalizations, surgeries, and ER visits in previous 12 months Vitals Screenings to include cognitive, depression, and falls Referrals and appointments  In addition, I have reviewed and discussed with patient certain preventive protocols, quality metrics, and best practice  recommendations. A written personalized care plan for preventive services as well as general preventive health recommendations were provided to patient.   Angeline Fredericks, LPN   [id number]   After Visit Summary: (MyChart) Due to this being a telephonic visit, the after visit summary with patients personalized plan was offered to patient via MyChart   Notes: Nothing significant to report at this time.

## 2024-08-11 NOTE — Patient Instructions (Signed)
 Bailey Brooks,  Thank you for taking the time for your Medicare Wellness Visit. I appreciate your continued commitment to your health goals. Please review the care plan we discussed, and feel free to reach out if I can assist you further.  Medicare recommends these wellness visits once per year to help you and your care team stay ahead of potential health issues. These visits are designed to focus on prevention, allowing your provider to concentrate on managing your acute and chronic conditions during your regular appointments.  Please note that Annual Wellness Visits do not include a physical exam. Some assessments may be limited, especially if the visit was conducted virtually. If needed, we may recommend a separate in-person follow-up with your provider.  Ongoing Care Seeing your primary care provider every 3 to 6 months helps us  monitor your health and provide consistent, personalized care.  Remember to update your flu and covid vaccines as discussed.  Referrals If a referral was made during today's visit and you haven't received any updates within two weeks, please contact the referred provider directly to check on the status.  Recommended Screenings:  Health Maintenance  Topic Date Due   Flu Shot  07/04/2024   COVID-19 Vaccine (8 - 2025-26 season) 08/04/2024   Mammogram  02/10/2025   Medicare Annual Wellness Visit  08/11/2025   Colon Cancer Screening  01/14/2031   DTaP/Tdap/Td vaccine (3 - Td or Tdap) 01/08/2034   Pneumococcal Vaccine for age over 64  Completed   DEXA scan (bone density measurement)  Completed   Hepatitis C Screening  Completed   Zoster (Shingles) Vaccine  Completed   HPV Vaccine  Aged Out   Meningitis B Vaccine  Aged Out   Hepatitis B Vaccine  Discontinued       08/11/2024    3:54 PM  Advanced Directives  Does Patient Have a Medical Advance Directive? Yes  Type of Estate agent of [place];Living will  Copy of Healthcare Power of  Attorney in Chart? No - copy requested   Advance Care Planning is important because it: Ensures you receive medical care that aligns with your values, goals, and preferences. Provides guidance to your family and loved ones, reducing the emotional burden of decision-making during critical moments.  Vision: Annual vision screenings are recommended for early detection of glaucoma, cataracts, and diabetic retinopathy. These exams can also reveal signs of chronic conditions such as diabetes and high blood pressure.  Dental: Annual dental screenings help detect early signs of oral cancer, gum disease, and other conditions linked to overall health, including heart disease and diabetes.  Please see the attached documents for additional preventive care recommendations.

## 2024-08-16 ENCOUNTER — Other Ambulatory Visit: Payer: Self-pay | Admitting: Internal Medicine

## 2024-08-27 DIAGNOSIS — Z96641 Presence of right artificial hip joint: Secondary | ICD-10-CM | POA: Diagnosis not present

## 2024-08-29 ENCOUNTER — Inpatient Hospital Stay: Admitting: Internal Medicine

## 2024-08-29 ENCOUNTER — Inpatient Hospital Stay: Attending: Internal Medicine

## 2024-08-29 ENCOUNTER — Other Ambulatory Visit: Payer: Self-pay

## 2024-08-29 ENCOUNTER — Encounter: Payer: Self-pay | Admitting: Internal Medicine

## 2024-08-29 ENCOUNTER — Encounter: Payer: Self-pay | Admitting: Nurse Practitioner

## 2024-08-29 VITALS — BP 146/71 | HR 71 | Temp 97.9°F | Resp 18 | Ht 63.0 in

## 2024-08-29 DIAGNOSIS — Z803 Family history of malignant neoplasm of breast: Secondary | ICD-10-CM

## 2024-08-29 DIAGNOSIS — Z170 Estrogen receptor positive status [ER+]: Secondary | ICD-10-CM | POA: Insufficient documentation

## 2024-08-29 DIAGNOSIS — Z8042 Family history of malignant neoplasm of prostate: Secondary | ICD-10-CM

## 2024-08-29 DIAGNOSIS — G4733 Obstructive sleep apnea (adult) (pediatric): Secondary | ICD-10-CM | POA: Insufficient documentation

## 2024-08-29 DIAGNOSIS — Z8051 Family history of malignant neoplasm of kidney: Secondary | ICD-10-CM | POA: Insufficient documentation

## 2024-08-29 DIAGNOSIS — C50411 Malignant neoplasm of upper-outer quadrant of right female breast: Secondary | ICD-10-CM

## 2024-08-29 DIAGNOSIS — Z79811 Long term (current) use of aromatase inhibitors: Secondary | ICD-10-CM | POA: Diagnosis not present

## 2024-08-29 DIAGNOSIS — Z808 Family history of malignant neoplasm of other organs or systems: Secondary | ICD-10-CM | POA: Insufficient documentation

## 2024-08-29 DIAGNOSIS — Z800 Family history of malignant neoplasm of digestive organs: Secondary | ICD-10-CM | POA: Insufficient documentation

## 2024-08-29 DIAGNOSIS — Z9484 Stem cells transplant status: Secondary | ICD-10-CM | POA: Diagnosis not present

## 2024-08-29 DIAGNOSIS — Z8572 Personal history of non-Hodgkin lymphomas: Secondary | ICD-10-CM | POA: Insufficient documentation

## 2024-08-29 DIAGNOSIS — Z1721 Progesterone receptor positive status: Secondary | ICD-10-CM

## 2024-08-29 DIAGNOSIS — Z8673 Personal history of transient ischemic attack (TIA), and cerebral infarction without residual deficits: Secondary | ICD-10-CM | POA: Insufficient documentation

## 2024-08-29 DIAGNOSIS — R5383 Other fatigue: Secondary | ICD-10-CM | POA: Diagnosis not present

## 2024-08-29 DIAGNOSIS — Z1732 Human epidermal growth factor receptor 2 negative status: Secondary | ICD-10-CM | POA: Diagnosis not present

## 2024-08-29 DIAGNOSIS — Z5181 Encounter for therapeutic drug level monitoring: Secondary | ICD-10-CM

## 2024-08-29 DIAGNOSIS — Z8544 Personal history of malignant neoplasm of other female genital organs: Secondary | ICD-10-CM | POA: Insufficient documentation

## 2024-08-29 DIAGNOSIS — Z08 Encounter for follow-up examination after completed treatment for malignant neoplasm: Secondary | ICD-10-CM

## 2024-08-29 LAB — CMP (CANCER CENTER ONLY)
ALT: 15 U/L (ref 0–44)
AST: 24 U/L (ref 15–41)
Albumin: 3.5 g/dL (ref 3.5–5.0)
Alkaline Phosphatase: 82 U/L (ref 38–126)
Anion gap: 10 (ref 5–15)
BUN: 20 mg/dL (ref 8–23)
CO2: 25 mmol/L (ref 22–32)
Calcium: 9.3 mg/dL (ref 8.9–10.3)
Chloride: 102 mmol/L (ref 98–111)
Creatinine: 0.93 mg/dL (ref 0.44–1.00)
GFR, Estimated: >60 mL/min (ref >60)
Glucose, Bld: 113 mg/dL — ABNORMAL HIGH (ref 70–99)
Potassium: 4 mmol/L (ref 3.5–5.1)
Sodium: 137 mmol/L (ref 135–145)
Total Bilirubin: 0.5 mg/dL (ref 0.0–1.2)
Total Protein: 6.9 g/dL (ref 6.5–8.1)

## 2024-08-29 LAB — CBC WITH DIFFERENTIAL (CANCER CENTER ONLY)
Abs Immature Granulocytes: 0.03 K/uL (ref 0.00–0.07)
Basophils Absolute: 0 K/uL (ref 0.0–0.1)
Basophils Relative: 0 %
Eosinophils Absolute: 0.1 K/uL (ref 0.0–0.5)
Eosinophils Relative: 1 %
HCT: 38.9 % (ref 36.0–46.0)
Hemoglobin: 12.4 g/dL (ref 12.0–15.0)
Immature Granulocytes: 1 %
Lymphocytes Relative: 42 %
Lymphs Abs: 2.7 K/uL (ref 0.7–4.0)
MCH: 28.3 pg (ref 26.0–34.0)
MCHC: 31.9 g/dL (ref 30.0–36.0)
MCV: 88.8 fL (ref 80.0–100.0)
Monocytes Absolute: 0.5 K/uL (ref 0.1–1.0)
Monocytes Relative: 8 %
Neutro Abs: 3.1 K/uL (ref 1.7–7.7)
Neutrophils Relative %: 48 %
Platelet Count: 229 K/uL (ref 150–400)
RBC: 4.38 MIL/uL (ref 3.87–5.11)
RDW: 13.9 % (ref 11.5–15.5)
WBC Count: 6.3 K/uL (ref 4.0–10.5)
nRBC: 0 % (ref 0.0–0.2)

## 2024-08-29 NOTE — Progress Notes (Signed)
 [facility] OFFICE PROGRESS NOTE  Patient Care Team: Bair, Kalpana, MD as PCP - General (Family Medicine) Darron Deatrice LABOR, MD as PCP - Cardiology (Cardiology) Maurie Rayfield BIRCH, RN as Registered Nurse Brahmanday, Govinda R, MD as Consulting Physician (Oncology) Twylla Glendia BROCKS, MD (Urology)   Cancer Staging  No matching staging information was found for the patient.  Oncology History Overview Note  # RIGHT BREAST CANCER stage I [pTapN0; G-1; ER-100%; PR-99%; Her-2-NEG; Dr.Sakai]. NO Oncotype;s/p RT [12/29/2019]  # MARCH 1st 2021- Anastrzole;  STOPPED anastrozole  [stopped in April, 2023]; June 2nd, 2023- Examestane 25 mg/day.     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   DIAGNOSIS: Right breast  STAGE:   I      ;  GOALS: cure    History of lymphoma  Carcinoma of upper-outer quadrant of right breast in female, estrogen receptor positive (HCC)  10/28/2019 Initial Diagnosis   Carcinoma of upper-outer quadrant of right breast in female, estrogen receptor positive (HCC)   03/20/2020 Genetic Testing   Negative genetic testing on the multi-cancer panel.  The Multi-Gene Panel offered by [facility] includes sequencing and/or deletion duplication testing of the following 85 genes: AIP, ALK, APC, ATM, AXIN2,BAP1,  BARD1, BLM, BMPR1A, BRCA1, BRCA2, BRIP1, CASR, CDC73, CDH1, CDK4, CDKN1B, CDKN1C, CDKN2A (p14ARF), CDKN2A (p16INK4a), CEBPA, CHEK2, CTNNA1, DICER1, DIS3L2, EGFR (c.2369C>T, p.Thr790Met variant only), EPCAM (Deletion/duplication testing only), FH, FLCN, GATA2, GPC3, GREM1 (Promoter region deletion/duplication testing only), HOXB13 (c.251G>A,  p.Gly84Glu), HRAS, KIT, MAX, MEN1, MET, MITF (c.952G>A, p.Glu318Lys variant only), MLH1, MSH2, MSH3, MSH6, MUTYH, NBN, NF1, NF2, NTHL1, PALB2, PDGFRA, PHOX2B, PMS2, POLD1, POLE, POT1, PRKAR1A, PTCH1, PTEN, RAD50, RAD51C, RAD51D, RB1, RECQL4, RET, RNF43, RUNX1, SDHAF2, SDHA (sequence changes only), SDHB, SDHC, SDHD, SMAD4, SMARCA4, SMARCB1, SMARCE1, STK11, SUFU, TERC, TERT, TMEM127, TP53, TSC1, TSC2, VHL, WRN and WT1.  The report date is March 20, 2020.    INTERVAL HISTORY: Ambulating independently.  Alone.   Bailey Brooks 73 y.o.  female pleasant patient stage I ER/PR positive HER-2 negative breast cancer currently on adjuvant anatraszole is here for follow-up.  Mammogram 02/12/24. Awaiting PT for her hip pain post hip replacement. She feels that anastrozole  worsens symptoms and is eager to discontinue next year.  Appetite is good. Denies any breast pain. No new lumps in breast.    Review of Systems  Constitutional:  Positive for malaise/fatigue. Negative for chills, diaphoresis and fever.  HENT:  Negative for nosebleeds and sore throat.   Eyes:  Negative for double vision.  Respiratory:  Negative for cough, hemoptysis, sputum production, shortness of breath and wheezing.   Cardiovascular:  Negative for chest pain, palpitations, orthopnea and leg swelling.  Gastrointestinal:  Negative for abdominal pain, blood in stool, constipation, diarrhea, heartburn, melena, nausea and vomiting.  Genitourinary:  Negative for dysuria, frequency and urgency.  Musculoskeletal:  Positive for back pain and joint pain.  Skin: Negative.  Negative for itching and rash.  Neurological:  Negative for dizziness, tingling, focal weakness, weakness and headaches.  Endo/Heme/Allergies:  Does not bruise/bleed easily.  Psychiatric/Behavioral:  Negative for depression. The patient is not nervous/anxious and does not have insomnia.     PAST MEDICAL HISTORY :  Past Medical History:  Diagnosis Date   Adult pulmonary  Langerhans cell histiocytosis (HCC)    Eosinophilic Granuloma of the Lung)   Allergic rhinitis    Antineoplastic chemotherapy induced anemia    Arthritis    Breast cancer, right (HCC) 09/17/2019   a.) Bx (+) stage 1 IMC (G1, ER/PR +, Her2/neu -); pT1a N0 M0; Tx'd with adjuvant XRT + endocrine therapy   Bruising 12/14/2017   Complication of anesthesia    pt was awake during intubation for colonoscopy   Diastolic dysfunction    a.) TTE 11/14/2016: EF 60-65%, no rwma, mild LVH, triv MR, mildly dil LA, nl RVSF, G1DD; b. 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   Hypercholesterolemia    Hypertension    Hypokalemia 02/10/2017   Hypomagnesemia 03/02/2016   Hypothyroidism    Long term current use of aromatase inhibitor    Morbid obesity (HCC)    Muscle cramps 11/13/2012   Ommaya reservoir present 2012   a.) placed for intrathecal chemotherapy; remains in place as of 11/2022   Paget's disease of vulva (HCC) 05/13/2013   a.) Bx (+) IPN of vulva consistent with Pagets; resected; b.) s/p re-excision 09/29/2015   Pre-diabetes    Stroke [facility])    Syncope    TIA (transient ischemic attack)    a.) s/p TIA 01/02/2011; b.) s/p TIA 02/10/2017; c.) s/p TIA 04/02/2022; d.) 02/2017 event monitor: No significant arrhythmias.    Tubular adenoma of colon     PAST SURGICAL HISTORY :   Past Surgical History:  Procedure Laterality Date   ABDOMINAL HYSTERECTOMY  1985   Hysterectomy-partial   BREAST BIOPSY Right 2002   Neg - AT [facility]   BREAST BIOPSY Right 2020   bx done at [facility]?, IDC and DCIS   BREAST LUMPECTOMY Right 10/16/2019   IDC and DCIS, negative LN   BURR HOLE W/ PLACEMENT OMMAYA RESERVOIR     BURR HOLE W/ PLACEMENT OMMAYA RESERVOIR  2012   COLONOSCOPY  04/2013   ESOPHAGOGASTRODUODENOSCOPY  04/2013   INJECTION KNEE  07/02/2018   INSERTION CENTRAL VENOUS ACCESS DEVICE W/ SUBCUTANEOUS PORT  2011   Port a Cath: Right chest Double Lumen, 04-Nov-2010   LIMBAL STEM CELL TRANSPLANT  2012   LIVER BIOPSY     stage 4B large Bcell lymphoma   PARTIAL MASTECTOMY WITH NEEDLE LOCALIZATION AND AXILLARY SENTINEL LYMPH NODE BX Right 10/16/2019   Procedure: PARTIAL MASTECTOMY WITH NEEDLE LOCALIZATION AND AXILLARY SENTINEL LYMPH NODE BX;  Surgeon: Tye Millet, DO;  Location: [facility] ORS;  Service: General;  Laterality: Right;   REVERSE SHOULDER ARTHROPLASTY Left 11/14/2022   Procedure: REVERSE SHOULDER ARTHROPLASTY WITH BICEPS TENODESIS.;  Surgeon: Edie Norleen PARAS, MD;  Location: [facility] OR[facility];  Service: Orthopedics;  Laterality: Left;   ROTATOR CUFF  REPAIR Right 2016   SHOULDER ARTHROSCOPY Right 06/22/2015   Procedure: ARTHROSCOPY SHOULDER, parital repair of rotator cuff, biceps tenodesis, decompression and debridement;  Surgeon: Norleen JINNY Maltos, MD;  Location: [facility] OR[facility];  Service: Orthopedics;  Laterality: Right;   TONSILLECTOMY  1954   TOTAL HIP ARTHROPLASTY Right 05/29/2024   Procedure: ARTHROPLASTY, HIP, TOTAL,POSTERIOR APPROACH;  Surgeon: Maltos Norleen JINNY, MD;  Location: [facility] OR[facility];  Service: Orthopedics;  Laterality: Right;   VULVECTOMY     VULVECTOMY PARTIAL N/A 09/29/2015   Procedure: VULVECTOMY PARTIAL;  Surgeon: Webb Isidor Constable, MD;  Location: [facility] OR[facility];  Service: Gynecology;  Laterality: N/A;    FAMILY HISTORY :   Family  History  Problem Relation Age of Onset   Diabetes Mother        died @ 62 of MI.   Hypertension Mother    Heart attack Mother    Melanoma Father 66       died of complications r/t melanoma w/ lung mets.   Kidney disease Sister        Kidney removed    Breast cancer Sister 81   Prostate cancer Brother        Prostate - dx in 48's   Heart disease Brother 33       reported MI @ age 66, ? treated w/ TPA->no recurrent CAD, now in 68's.   Hearing loss Maternal Aunt    Cancer Maternal Aunt        pancreatic vs colon cancer   Hearing loss Maternal Grandfather    Other Maternal Grandmother        flu pandemic   Kidney cancer Niece 77       partial nephrectomy    SOCIAL HISTORY:   Social History   Tobacco Use   Smoking status: Never   Smokeless tobacco: Never  Vaping Use   Vaping status: Never Used  Substance Use Topics   Alcohol use: No    Alcohol/week: 0.0 standard drinks of alcohol   Drug use: No    ALLERGIES:  is allergic to morphine and codeine, sulfa antibiotics, adhesive [tape], erythromycin, and oxycodone .  MEDICATIONS:  Current Outpatient Medications  Medication Sig Dispense Refill   acetaminophen  (TYLENOL ) 650 MG CR tablet Take 1,300 mg by mouth in the morning and at bedtime.     anastrozole  (ARIMIDEX ) 1 MG tablet TAKE 1 TABLET BY MOUTH DAILY 90 tablet 1   atorvastatin  (LIPITOR ) 80 MG tablet TAKE 1 TABLET BY MOUTH DAILY 90 tablet 3   CALCIUM  PO Take 1 tablet by mouth daily.     cholecalciferol  (VITAMIN D3) 25 MCG (1000 UT) tablet Take 1,000 Units by mouth daily.     diclofenac  Sodium (VOLTAREN ) 1 % GEL Apply 2 g topically in the morning and at bedtime.     ezetimibe  (ZETIA ) 10 MG tablet TAKE 1 TABLET BY MOUTH DAILY 90 tablet 3   hydrochlorothiazide  (HYDRODIURIL ) 12.5 MG tablet Take 1 tablet (12.5 mg total) by mouth daily. 90 tablet 3   ketoconazole  (NIZORAL ) 2 % cream Apply twice daily as directed. 60 g 2   lansoprazole  (PREVACID ) 30 MG capsule TAKE 1 CAPSULE BY  MOUTH ONCE DAILY 90 capsule 1   levothyroxine  (SYNTHROID ) 88 MCG tablet TAKE 1 TABLET BY MOUTH ONCE DAILY. TAKE ON EMPTY STOMACH WITH A GLASS OF WATER AT LEAST 30-60 MINUTES BEFORE BREAKFAST 90 tablet 3   lisinopril  (ZESTRIL ) 40 MG tablet Take 1 tablet (40 mg total) by mouth daily. 90 tablet 3  mometasone  (ELOCON ) 0.1 % cream APPLY TO ITCHY SPOTS ON BODY 1-2 TIMES DAILY UNTIL IMPROVED; AVOID FACE, GROIN,AND UNDERARMS 45 g 1   Multiple Vitamin (MULTIVITAMIN) tablet Take 1 tablet by mouth daily.     Specialty Vitamins Products (ICAPS LUTEIN  & ZEAXANTHIN PO) Take 1 tablet by mouth every morning. Zeaxanthin 4mg /Lutein  10mg      tacrolimus  (PROTOPIC ) 0.1 % ointment Apply 1-2 times daily as needed for itch to affected areas. 60 g 2   No current facility-administered medications for this visit.   PHYSICAL EXAMINATION: ECOG PERFORMANCE STATUS: 0 - Asymptomatic  BP (!) 146/71 Comment: patient has not taking bp med today.  Pulse 71   Temp 97.9 F (36.6 C) (Tympanic)   Resp 18   Ht 5' 3 (1.6 m)   SpO2 97%   BMI 42.51 kg/m   There were no vitals filed for this visit.  Physical Exam Vitals reviewed.  Constitutional:      Appearance: She is not ill-appearing.  HENT:     Head: Normocephalic and atraumatic.  Cardiovascular:     Rate and Rhythm: Normal rate and regular rhythm.  Pulmonary:     Effort: No respiratory distress.     Breath sounds: No wheezing.  Chest:     Comments: Declines breast exam Abdominal:     General: There is no distension.     Tenderness: There is no abdominal tenderness.  Musculoskeletal:     Comments: Slowed gait.   Skin:    General: Skin is warm.  Neurological:     Mental Status: She is alert and oriented to person, place, and time.  Psychiatric:        Mood and Affect: Mood and affect normal.        Behavior: Behavior normal.    LABORATORY DATA:  I have reviewed the data as listed    Component Value Date/Time   NA 137 08/29/2024 1007   NA 138  09/03/2013 0809   K 4.0 08/29/2024 1007   K 4.3 11/02/2014 0932   CL 102 08/29/2024 1007   CL 105 09/03/2013 0809   CO2 25 08/29/2024 1007   CO2 28 09/03/2013 0809   GLUCOSE 113 (H) 08/29/2024 1007   GLUCOSE 100 (H) 09/03/2013 0809   BUN 20 08/29/2024 1007   BUN 17 09/03/2013 0809   CREATININE 0.93 08/29/2024 1007   CREATININE 0.96 11/02/2014 0929   CALCIUM  9.3 08/29/2024 1007   CALCIUM  9.1 09/03/2013 0809   PROT 6.9 08/29/2024 1007   PROT 6.6 11/02/2014 0929   ALBUMIN 3.5 08/29/2024 1007   ALBUMIN 3.3 (L) 11/02/2014 0929   AST 24 08/29/2024 1007   ALT 15 08/29/2024 1007   ALT 19 11/02/2014 0929   ALKPHOS 82 08/29/2024 1007   ALKPHOS 87 11/02/2014 0929   BILITOT 0.5 08/29/2024 1007   GFRNONAA >60 08/29/2024 1007   GFRNONAA >60 11/02/2014 0929   GFRNONAA >60 07/27/2014 0836   GFRAA >60 07/05/2020 0923   GFRAA >60 11/02/2014 0929   GFRAA >60 07/27/2014 0836    No results found for: SPEP, UPEP  Lab Results  Component Value Date   WBC 6.3 08/29/2024   NEUTROABS 3.1 08/29/2024   HGB 12.4 08/29/2024   HCT 38.9 08/29/2024   MCV 88.8 08/29/2024   PLT 229 08/29/2024      Chemistry      Component Value Date/Time   NA 137 08/29/2024 1007   NA 138 09/03/2013 0809   K 4.0 08/29/2024 1007   K  4.3 11/02/2014 0932   CL 102 08/29/2024 1007   CL 105 09/03/2013 0809   CO2 25 08/29/2024 1007   CO2 28 09/03/2013 0809   BUN 20 08/29/2024 1007   BUN 17 09/03/2013 0809   CREATININE 0.93 08/29/2024 1007   CREATININE 0.96 11/02/2014 0929      Component Value Date/Time   CALCIUM  9.3 08/29/2024 1007   CALCIUM  9.1 09/03/2013 0809   ALKPHOS 82 08/29/2024 1007   ALKPHOS 87 11/02/2014 0929   AST 24 08/29/2024 1007   ALT 15 08/29/2024 1007   ALT 19 11/02/2014 0929   BILITOT 0.5 08/29/2024 1007     RADIOGRAPHIC STUDIES: I have personally reviewed the radiological images as listed and agreed with the findings in the report. No results found.   ASSESSMENT & PLAN:   Carcinoma of upper-outer quadrant of right breast in female, estrogen receptor positive  # Right breast cancer stage I ER/PR Pos; HER-2 negative. Anastrozole  [until spring, 2026].; march 2025-NEG.    # MSK grade 2-secondary to adjuvant AI; continue Osteo Bi-Flex.- stable.OA- ? Right Severe arthritis-[Dr.Poggi]? Replacement.    # ? TIA-on monitor [April 2023] on asprin [off plavix ]  stable.   # Vular Paget's-followed by gynecology oncology Dr. Elby.   # Bone density test-February 2023-T-score 0.1-normal limits continue calcium  plus vitamin D . Discussed/ and reviewed. Will repeat in 2-3 years.    # Fatigue: reluctant with OSA/CPAP; considering CARE program.    mychart   # DISPOSITION: March 2026 mammogram & Bone density  No problem-specific Assessment & Plan notes found for this encounter.   No orders of the defined types were placed in this encounter.  All questions were answered. The patient knows to call the clinic with any problems, questions or concerns.    Tinnie KANDICE Dawn, NP 08/29/2024

## 2024-09-04 DIAGNOSIS — M25551 Pain in right hip: Secondary | ICD-10-CM | POA: Diagnosis not present

## 2024-09-04 DIAGNOSIS — Z96641 Presence of right artificial hip joint: Secondary | ICD-10-CM | POA: Diagnosis not present

## 2024-09-10 DIAGNOSIS — Z96641 Presence of right artificial hip joint: Secondary | ICD-10-CM | POA: Diagnosis not present

## 2024-09-10 DIAGNOSIS — M25551 Pain in right hip: Secondary | ICD-10-CM | POA: Diagnosis not present

## 2024-09-11 ENCOUNTER — Telehealth: Payer: Self-pay | Admitting: Obstetrics and Gynecology

## 2024-09-11 NOTE — Telephone Encounter (Signed)
 Called pt number and unable to leave vm.  Pt called back and I confirmed new gyn-onc appt due to pt only seeing Dr.Secord.

## 2024-09-12 DIAGNOSIS — M25551 Pain in right hip: Secondary | ICD-10-CM | POA: Diagnosis not present

## 2024-09-12 DIAGNOSIS — Z96641 Presence of right artificial hip joint: Secondary | ICD-10-CM | POA: Diagnosis not present

## 2024-09-17 DIAGNOSIS — Z96641 Presence of right artificial hip joint: Secondary | ICD-10-CM | POA: Diagnosis not present

## 2024-09-17 DIAGNOSIS — M25551 Pain in right hip: Secondary | ICD-10-CM | POA: Diagnosis not present

## 2024-09-19 DIAGNOSIS — M25551 Pain in right hip: Secondary | ICD-10-CM | POA: Diagnosis not present

## 2024-09-19 DIAGNOSIS — Z96641 Presence of right artificial hip joint: Secondary | ICD-10-CM | POA: Diagnosis not present

## 2024-09-24 DIAGNOSIS — M25551 Pain in right hip: Secondary | ICD-10-CM | POA: Diagnosis not present

## 2024-09-24 DIAGNOSIS — Z96641 Presence of right artificial hip joint: Secondary | ICD-10-CM | POA: Diagnosis not present

## 2024-09-26 DIAGNOSIS — Z96641 Presence of right artificial hip joint: Secondary | ICD-10-CM | POA: Diagnosis not present

## 2024-09-26 DIAGNOSIS — M25551 Pain in right hip: Secondary | ICD-10-CM | POA: Diagnosis not present

## 2024-10-10 ENCOUNTER — Other Ambulatory Visit: Payer: Self-pay | Admitting: Student

## 2024-10-10 DIAGNOSIS — M5416 Radiculopathy, lumbar region: Secondary | ICD-10-CM

## 2024-10-10 DIAGNOSIS — M47816 Spondylosis without myelopathy or radiculopathy, lumbar region: Secondary | ICD-10-CM

## 2024-10-10 DIAGNOSIS — M4807 Spinal stenosis, lumbosacral region: Secondary | ICD-10-CM

## 2024-10-14 ENCOUNTER — Ambulatory Visit
Admission: RE | Admit: 2024-10-14 | Discharge: 2024-10-14 | Disposition: A | Discharge status: Home / Self Care | Source: Ambulatory Visit | Attending: Student | Admitting: Student

## 2024-10-14 DIAGNOSIS — M5416 Radiculopathy, lumbar region: Secondary | ICD-10-CM | POA: Insufficient documentation

## 2024-10-14 DIAGNOSIS — M47816 Spondylosis without myelopathy or radiculopathy, lumbar region: Secondary | ICD-10-CM | POA: Diagnosis present

## 2024-10-14 DIAGNOSIS — M4807 Spinal stenosis, lumbosacral region: Secondary | ICD-10-CM | POA: Diagnosis present

## 2024-10-20 ENCOUNTER — Ambulatory Visit: Payer: Medicare Other | Admitting: Dermatology

## 2024-10-20 DIAGNOSIS — L300 Nummular dermatitis: Secondary | ICD-10-CM

## 2024-10-20 DIAGNOSIS — D1801 Hemangioma of skin and subcutaneous tissue: Secondary | ICD-10-CM

## 2024-10-20 DIAGNOSIS — L304 Erythema intertrigo: Secondary | ICD-10-CM

## 2024-10-20 DIAGNOSIS — L309 Dermatitis, unspecified: Secondary | ICD-10-CM

## 2024-10-20 DIAGNOSIS — L814 Other melanin hyperpigmentation: Secondary | ICD-10-CM

## 2024-10-20 DIAGNOSIS — L578 Other skin changes due to chronic exposure to nonionizing radiation: Secondary | ICD-10-CM

## 2024-10-20 DIAGNOSIS — L918 Other hypertrophic disorders of the skin: Secondary | ICD-10-CM

## 2024-10-20 DIAGNOSIS — L821 Other seborrheic keratosis: Secondary | ICD-10-CM

## 2024-10-20 DIAGNOSIS — I781 Nevus, non-neoplastic: Secondary | ICD-10-CM

## 2024-10-20 DIAGNOSIS — Z1283 Encounter for screening for malignant neoplasm of skin: Secondary | ICD-10-CM

## 2024-10-20 DIAGNOSIS — L817 Pigmented purpuric dermatosis: Secondary | ICD-10-CM

## 2024-10-20 DIAGNOSIS — L820 Inflamed seborrheic keratosis: Secondary | ICD-10-CM | POA: Diagnosis not present

## 2024-10-20 DIAGNOSIS — W908XXA Exposure to other nonionizing radiation, initial encounter: Secondary | ICD-10-CM

## 2024-10-20 DIAGNOSIS — D2371 Other benign neoplasm of skin of right lower limb, including hip: Secondary | ICD-10-CM

## 2024-10-20 DIAGNOSIS — D2262 Melanocytic nevi of left upper limb, including shoulder: Secondary | ICD-10-CM

## 2024-10-20 DIAGNOSIS — D692 Other nonthrombocytopenic purpura: Secondary | ICD-10-CM

## 2024-10-20 DIAGNOSIS — D225 Melanocytic nevi of trunk: Secondary | ICD-10-CM

## 2024-10-20 DIAGNOSIS — D2239 Melanocytic nevi of other parts of face: Secondary | ICD-10-CM

## 2024-10-20 DIAGNOSIS — D229 Melanocytic nevi, unspecified: Secondary | ICD-10-CM

## 2024-10-20 MED ORDER — MOMETASONE FUROATE 0.1 % EX CREA: TOPICAL_CREAM | CUTANEOUS | 1 refills | Status: AC

## 2024-10-20 MED ORDER — KETOCONAZOLE 2 % EX CREA: TOPICAL_CREAM | CUTANEOUS | 2 refills | Status: AC

## 2024-10-20 NOTE — Patient Instructions (Addendum)
 Seborrheic Keratosis  What causes seborrheic keratoses? Seborrheic keratoses are harmless, common skin growths that first appear during adult life.  As time goes by, more growths appear.  Some people may develop a large number of them.  Seborrheic keratoses appear on both covered and uncovered body parts.  They are not caused by sunlight.  The tendency to develop seborrheic keratoses can be inherited.  They vary in color from skin-colored to gray, brown, or even black.  They can be either smooth or have a rough, warty surface.   Seborrheic keratoses are superficial and look as if they were stuck on the skin.  Under the microscope this type of keratosis looks like layers upon layers of skin.  That is why at times the top layer may seem to fall off, but the rest of the growth remains and re-grows.    Treatment Seborrheic keratoses do not need to be treated, but can easily be removed in the office.  Seborrheic keratoses often cause symptoms when they rub on clothing or jewelry.  Lesions can be in the way of shaving.  If they become inflamed, they can cause itching, soreness, or burning.  Removal of a seborrheic keratosis can be accomplished by freezing, burning, or surgery. If any spot bleeds, scabs, or grows rapidly, please return to have it checked, as these can be an indication of a skin cancer.   Cryotherapy Aftercare  Wash gently with soap and water everyday.   Apply Vaseline and Band-Aid daily until healed.     Melanoma ABCDEs  Melanoma is the most dangerous type of skin cancer, and is the leading cause of death from skin disease.  You are more likely to develop melanoma if you: Have light-colored skin, light-colored eyes, or red or blond hair Spend a lot of time in the sun Tan regularly, either outdoors or in a tanning bed Have had blistering sunburns, especially during childhood Have a close family member who has had a melanoma Have atypical moles or large birthmarks  Early detection  of melanoma is key since treatment is typically straightforward and cure rates are extremely high if we catch it early.   The first sign of melanoma is often a change in a mole or a new dark spot.  The ABCDE system is a way of remembering the signs of melanoma.  A for asymmetry:  The two halves do not match. B for border:  The edges of the growth are irregular. C for color:  A mixture of colors are present instead of an even brown color. D for diameter:  Melanomas are usually (but not always) greater than 6mm - the size of a pencil eraser. E for evolution:  The spot keeps changing in size, shape, and color.  Please check your skin once per month between visits. You can use a small mirror in front and a large mirror behind you to keep an eye on the back side or your body.   If you see any new or changing lesions before your next follow-up, please call to schedule a visit.  Please continue daily skin protection including broad spectrum sunscreen SPF 30+ to sun-exposed areas, reapplying every 2 hours as needed when you're outdoors.   Staying in the shade or wearing long sleeves, sun glasses (UVA+UVB protection) and wide brim hats (4-inch brim around the entire circumference of the hat) are also recommended for sun protection.     Due to recent changes in healthcare laws, you may see results of your  pathology and/or laboratory studies on MyChart before the doctors have had a chance to review them. We understand that in some cases there may be results that are confusing or concerning to you. Please understand that not all results are received at the same time and often the doctors may need to interpret multiple results in order to provide you with the best plan of care or course of treatment. Therefore, we ask that you please give us  2 business days to thoroughly review all your results before contacting the office for clarification. Should we see a critical lab result, you will be contacted  sooner.   If You Need Anything After Your Visit  If you have any questions or concerns for your doctor, please call our main line at [phone] and press option 4 to reach your doctor's medical assistant. If no one answers, please leave a voicemail as directed and we will return your call as soon as possible. Messages left after 4 pm will be answered the following business day.   You may also send us  a message via MyChart. We typically respond to MyChart messages within 1-2 business days.  For prescription refills, please ask your pharmacy to contact our office. Our fax number is [phone].  If you have an urgent issue when the clinic is closed that cannot wait until the next business day, you can page your doctor at the number below.    Please note that while we do our best to be available for urgent issues outside of office hours, we are not available 24/7.   If you have an urgent issue and are unable to reach us , you may choose to seek medical care at your doctor's office, retail clinic, urgent care center, or emergency room.  If you have a medical emergency, please immediately call 911 or go to the emergency department.  Pager Numbers  - Dr. Hester: [phone]  - Dr. Jackquline: [phone]  - Dr. Claudene: [phone]   - Dr. Raymund: [phone]  In the event of inclement weather, please call our main line at [phone] for an update on the status of any delays or closures.  Dermatology Medication Tips: Please keep the boxes that topical medications come in in order to help keep track of the instructions about where and how to use these. Pharmacies typically print the medication instructions only on the boxes and not directly on the medication tubes.   If your medication is too expensive, please contact our office at [phone] option 4 or send us  a message through MyChart.   We are unable to tell what your co-pay for medications will be in advance as this is  different depending on your insurance coverage. However, we may be able to find a substitute medication at lower cost or fill out paperwork to get insurance to cover a needed medication.   If a prior authorization is required to get your medication covered by your insurance company, please allow us  1-2 business days to complete this process.  Drug prices often vary depending on where the prescription is filled and some pharmacies may offer cheaper prices.  The website www.goodrx.com contains coupons for medications through different pharmacies. The prices here do not account for what the cost may be with help from insurance (it may be cheaper with your insurance), but the website can give you the price if you did not use any insurance.  - You can print the associated coupon and take it with your prescription to the pharmacy.  -  You may also stop by our office during regular business hours and pick up a GoodRx coupon card.  - If you need your prescription sent electronically to a different pharmacy, notify our office through [facility] or by phone at [phone] option 4.     Si Usted Necesita Algo Despus de Su Visita  Tambin puede enviarnos un mensaje a travs de Clinical cytogeneticist. Por lo general respondemos a los mensajes de MyChart en el transcurso de 1 a 2 das hbiles.  Para renovar recetas, por favor pida a su farmacia que se ponga en contacto con nuestra oficina. Randi lakes de fax es [place] [phone].  Si tiene un asunto urgente cuando la clnica est cerrada y que no puede esperar hasta el siguiente da hbil, puede llamar/localizar a su doctor(a) al nmero que aparece a continuacin.   Por favor, tenga en cuenta que aunque hacemos todo lo posible para estar disponibles para asuntos urgentes fuera del horario de [place], no estamos disponibles las 24 horas del da, los 7 [address]  [address] de la [place].   Si tiene un problema urgente y no puede comunicarse con nosotros, puede optar por buscar  atencin mdica  en el consultorio de su doctor(a), en una clnica privada, en un centro de atencin urgente o en una sala de emergencias.  Si tiene Engineer, drilling, por favor llame inmediatamente al 911 o vaya a la sala de emergencias.  Nmeros de bper  - Dr. Hester: [phone]  - Dra. Jackquline: [phone]  - Dr. Claudene: [phone]  - Dra. Kitts: [phone]  En caso de inclemencias del [place], por favor llame a nuestra lnea principal al [phone] para una actualizacin sobre el estado de cualquier retraso o cierre.  Consejos para la medicacin en dermatologa: Por favor, guarde las cajas en las que vienen los medicamentos de uso tpico para ayudarle a seguir las instrucciones sobre dnde y cmo usarlos. Las farmacias generalmente imprimen las instrucciones del medicamento slo en las cajas y no directamente en los tubos del [place].   Si su medicamento es muy caro, por favor, pngase en contacto con landry rieger llamando al [phone] y presione la opcin 4 o envenos un mensaje a travs de Clinical cytogeneticist.   No podemos decirle cul ser su copago por los medicamentos por adelantado ya que esto es diferente dependiendo de la cobertura de su seguro. Sin embargo, es posible que podamos encontrar un medicamento sustituto a Audiological scientist un formulario para que el seguro cubra el medicamento que se considera necesario.   Si se requiere una autorizacin previa para que su compaa de seguros malta su medicamento, por favor permtanos de 1 a 2 das hbiles para completar este proceso.  Los precios de los medicamentos varan con frecuencia dependiendo del Environmental consultant de dnde se surte la receta y alguna farmacias pueden ofrecer precios ms baratos.  El sitio web www.goodrx.com tiene cupones para medicamentos de Health and safety inspector. Los precios aqu no tienen en cuenta lo que podra costar con la ayuda del seguro (puede ser ms barato con su seguro), pero el sitio web puede  darle el precio si no utiliz Tourist information centre manager.  - Puede imprimir el cupn correspondiente y llevarlo con su receta a la farmacia.  - Tambin puede pasar por nuestra oficina durante el horario de atencin regular y Education officer, museum una tarjeta de cupones de GoodRx.  - Si necesita que su receta se enve electrnicamente a Psychiatrist, informe a nuestra oficina a travs de MyChart de [employer] o por  telfono llamando al [phone] y presione la opcin 4.

## 2024-10-20 NOTE — Progress Notes (Signed)
 Follow-Up Visit   Subjective  Bailey Brooks is a 73 y.o. female who presents for the following: Skin Cancer Screening and Full Body Skin Exam Family hx of melanoma Hx of intertrigo Hx of isks Hx of nummular dermatitis  Hx of schamberg purpura Irritated spot at right leg and left leg and back Red spot at right cheek   The patient presents for Total-Body Skin Exam (TBSE) for skin cancer screening and mole check. The patient has spots, moles and lesions to be evaluated, some may be new or changing and the patient may have concern these could be cancer.    The following portions of the chart were reviewed this encounter and updated as appropriate: medications, allergies, medical history  Review of Systems:  No other skin or systemic complaints except as noted in HPI or Assessment and Plan.  Objective  Well appearing patient in no apparent distress; mood and affect are within normal limits.  A full examination was performed including scalp, head, eyes, ears, nose, lips, neck, chest, axillae, abdomen, back, buttocks, bilateral upper extremities, bilateral lower extremities, hands, feet, fingers, toes, fingernails, and toenails. All findings within normal limits unless otherwise noted below.   Relevant physical exam findings are noted in the Assessment and Plan.  right lower knee x 1, left upper knee x 1, right mid upper back at braline x 1, left mid upper back at braline x 1 (4) Erythematous stuck-on, waxy papule or plaque  Assessment & Plan   SKIN CANCER SCREENING PERFORMED TODAY.  ACTINIC DAMAGE - Chronic condition, secondary to cumulative UV/sun exposure - diffuse scaly erythematous macules with underlying dyspigmentation - Recommend daily broad spectrum sunscreen SPF 30+ to sun-exposed areas, reapply every 2 hours as needed.  - Staying in the shade or wearing long sleeves, sun glasses (UVA+UVB protection) and wide brim hats (4-inch brim around the entire circumference of  the hat) are also recommended for sun protection.  - Call for new or changing lesions.  LENTIGINES, SEBORRHEIC KERATOSES, HEMANGIOMAS - Benign normal skin lesions - Benign-appearing - Call for any changes  MELANOCYTIC NEVI - Tan-brown and/or pink-flesh-colored symmetric macules and papules - Left Upper Back 5 x 3 mm speckled brown macule  - Right Lower Back 6 x 3 mm regular brown macule  - Left palm 5 mm flesh papule  - Benign appearing on exam today, Stable. - Observation - Call clinic for new or changing moles - Recommend daily use of broad spectrum spf 30+ sunscreen to sun-exposed areas.   Purpura - Chronic; persistent and recurrent.  Treatable, but not curable. - Violaceous macules and patches - Benign - Related to trauma, age, sun damage and/or use of blood thinners, chronic use of topical and/or oral steroids - Observe - Can use OTC arnica containing moisturizer such as Dermend Bruise Formula if desired - Call for worsening or other concerns  Acrochordons (Skin Tags) - Fleshy, skin-colored pedunculated papules - Benign appearing.  - Observe. - If desired, they can be removed with an in office procedure that is not covered by insurance. - Please call the clinic if you notice any new or changing lesions.  Fibrous Papule - left nasal tip 2.5 mm firm flesh papule  - Benign-appearing.  Observation.  Call clinic for new or changing lesions.   TELANGIECTASIA Exam: 3 mm pink blanching macule at right mid cheek and also on malar cheeks, Benign features under dermoscopy.    Treatment Plan: Benign appearing on exam Call for changes Discussed BBL Counseling  for BBL / IPL / Laser and Coordination of Care Discussed the treatment option of Broad Band Light (BBL) /Intense Pulsed Light (IPL)/ Laser for skin discoloration, including brown spots and redness.  Typically we recommend at least 1-3 treatment sessions about 5-8 weeks apart for best results.  Cannot have tanned skin when BBL  performed, and regular use of sunscreen/photoprotection is advised after the procedure to help maintain results. The patient's condition may also require maintenance treatments in the future.  The fee for BBL / laser treatments is $350 per treatment session for the whole face.  A fee can be quoted for other parts of the body.  Insurance typically does not pay for BBL/laser treatments and therefore the fee is an out-of-pocket cost. Recommend prophylactic valtrex  treatment. Once scheduled for procedure, will send Rx in prior to patient's appointment.     Dermatofibroma  - Right medial lower thigh 6 mm pink brown firm papule with darker edge  A dermatofibroma is a benign growth possibly related to trauma, such as an insect bite, cut from shaving, or inflamed acne-type bump.  Treatment options to remove include shave or excision with resulting scar and risk of recurrence.  Since not bothersome, will observe for now. - Call for any changes   SCHAMBERG'S PIGMENTED PURPURA Exam: cayenne-pepper-like macules with associated golden-brown pigmentation of lower legs.  + lower leg edema at lower legs and medial ankles   Schamberg's purpura is a benign chronic condition that may also have intermittent episodes of worsening/improvement.  It is the most common type of pigmented purpura and is typically asymptomatic.  It generally affects older individuals, and may be related to leg swelling, increased activity, or excessive alcohol use.  Sometimes short term treatment with a mid-potency topical steroid is given for acute flares.  Treatment Plan: Benign, observe.   Recommend daily graduated compression hose/stockings- easiest to put on first thing in morning, remove at bedtime. Mometasone  cream every day/bid prn flares, Caution skin atrophy with long-term use.    NUMMULAR DERMATITIS  Exam:Pink tan patch at right wrist    Chronic and persistent condition with duration or expected duration over one year.  Condition is improving with treatment but not currently at goal.    Treatment Plan: Continue Tacrolimus  ointment once to twice daily as needed. Pt has at home.  Continue mometasone  cream once to twice daily to aa as needed for flares. Avoid applying to face, groin, and axilla. Use as directed. Long-term use can cause thinning of the skin. Pt has at home.    Erythema intertrigo Exam: clear today bilateral inguinal crease   Chronic condition with duration or expected duration over one year. Currently well-controlled.   Intertrigo is a chronic recurrent rash that occurs in skin fold areas that may be associated with friction; heat; moisture; yeast; fungus; and bacteria.  It is exacerbated by increased movement / activity; sweating; and higher atmospheric temperature.   Treatment Plan:  Continue Aquaphor daily as skin protectant Continue as needed Ketoconazole  2% cream Apply BID prn pink bumps/patches. Pt has. Tacrolimus  0.1% ointment Apply BID prn itchy rash. Pt has.  FAMILY HISTORY OF SKIN CANCER What type(s):melanoma  Who affected:father    ERYTHEMA INTERTRIGO   Related Medications ketoconazole  (NIZORAL ) 2 % cream Apply twice daily as directed. RASH   Related Medications tacrolimus  (PROTOPIC ) 0.1 % ointment Apply 1-2 times daily as needed for itch to affected areas. ECZEMA, UNSPECIFIED TYPE   Related Medications mometasone  (ELOCON ) 0.1 % cream APPLY TO ITCHY SPOTS  ON BODY 1-2 TIMES DAILY UNTIL IMPROVED; AVOID FACE, GROIN,AND UNDERARMS INFLAMED SEBORRHEIC KERATOSIS (4) right lower knee x 1, left upper knee x 1, right mid upper back at braline x 1, left mid upper back at braline x 1 (4) Symptomatic, irritating, patient would like treated.  Vs dermatofibroma at right lower knee Destruction of lesion - right lower knee x 1, left upper knee x 1, right mid upper back at braline x 1, left mid upper back at braline x 1 (4)  Destruction method: cryotherapy   Informed consent:  discussed and consent obtained   Lesion destroyed using liquid nitrogen: Yes   Region frozen until ice ball extended beyond lesion: Yes   Outcome: patient tolerated procedure well with no complications   Post-procedure details: wound care instructions given   Additional details:  Prior to procedure, discussed risks of blister formation, small wound, skin dyspigmentation, or rare scar following cryotherapy. Recommend Vaseline ointment to treated areas while healing.   Return in about 1 year (around 10/20/2025) for TBSE.  I, Eleanor Blush, CMA, am acting as scribe for Rexene Rattler, MD.   Documentation: I have reviewed the above documentation for accuracy and completeness, and I agree with the above.  Rexene Rattler, MD

## 2024-10-23 ENCOUNTER — Ambulatory Visit: Admitting: Podiatry

## 2024-10-23 ENCOUNTER — Encounter: Payer: Self-pay | Admitting: Podiatry

## 2024-10-23 DIAGNOSIS — M79674 Pain in right toe(s): Secondary | ICD-10-CM

## 2024-10-23 DIAGNOSIS — M79675 Pain in left toe(s): Secondary | ICD-10-CM | POA: Diagnosis not present

## 2024-10-23 DIAGNOSIS — B351 Tinea unguium: Secondary | ICD-10-CM

## 2024-11-01 NOTE — Progress Notes (Signed)
  Subjective:  Patient ID: Bailey Brooks, female    DOB: 1951-06-25,  MRN: [id number]  Bailey Brooks presents to clinic today for painful mycotic toenails x 10 which interfere with daily activities. Pain is relieved with periodic professional debridement.  Chief Complaint  Patient presents with   Nail Problem    Thick painful toenails, 3 month follow up    New problem(s): None.   PCP is Bair, Luke, MD.  Allergies  Allergen Reactions   Morphine And Codeine Nausea And Vomiting and Nausea Only    Hallucinations   Sulfa Antibiotics Other (See Comments)    Dizzy/Fainting   Adhesive [Tape] Rash and Other (See Comments)    Including Bandaids-paper tape ok to use   Erythromycin Nausea And Vomiting   Oxycodone  Nausea And Vomiting    Review of Systems: Negative except as noted in the HPI.  Objective: No changes noted in today's physical examination. There were no vitals filed for this visit. Bailey Brooks is a pleasant 73 y.o. female in NAD. AAO x 3.  Vascular Examination: Capillary refill time immediate b/l. Palpable pedal pulses. Pedal hair present b/l. Pedal edema absent. No pain with calf compression b/l. Skin temperature gradient WNL b/l. No cyanosis or clubbing b/l. No ischemia or gangrene noted b/l.   Neurological Examination: Sensation grossly intact b/l with 10 gram monofilament. Vibratory sensation intact b/l.   Dermatological Examination: Pedal skin with normal turgor, texture and tone b/l.  No open wounds. No interdigital macerations.   Toenails 1-5 b/l thick, discolored, elongated with subungual debris and pain on dorsal palpation.   No hyperkeratotic nor porokeratotic lesions.  Musculoskeletal Examination: Muscle strength 5/5 to all lower extremity muscle groups bilaterally. No pain, crepitus or joint limitation noted with ROM b/l LE. No gross bony pedal deformities b/l. Patient ambulates independently without assistive aids.  Radiographs:  None  Assessment/Plan: 1. Pain due to onychomycosis of toenails of both feet   Consent given for treatment. Patient examined. All patient's and/or POA's questions/concerns addressed on today's visit. Toenails 1-5 b/l debrided in length and girth without incident. Continue soft, supportive shoe gear daily. Report any pedal injuries to medical professional. Call office if there are any questions/concerns. -Patient/POA to call should there be question/concern in the interim.   Return in about 3 months (around 01/23/2025).  Bailey Brooks, DPM      [place] LOCATION: [address] [place] [address]                   Office [phone]   [facility] LOCATION: [address] [place], [place] [address] Office [phone]

## 2024-11-12 ENCOUNTER — Ambulatory Visit

## 2024-12-03 ENCOUNTER — Ambulatory Visit

## 2024-12-10 ENCOUNTER — Inpatient Hospital Stay: Attending: Internal Medicine | Admitting: Obstetrics and Gynecology

## 2024-12-10 VITALS — BP 107/62 | HR 84 | Temp 100.0°F | Ht 63.0 in | Wt 229.0 lb

## 2024-12-10 DIAGNOSIS — Z8544 Personal history of malignant neoplasm of other female genital organs: Secondary | ICD-10-CM | POA: Insufficient documentation

## 2024-12-10 DIAGNOSIS — Z79811 Long term (current) use of aromatase inhibitors: Secondary | ICD-10-CM | POA: Diagnosis not present

## 2024-12-10 DIAGNOSIS — C4499 Other specified malignant neoplasm of skin, unspecified: Secondary | ICD-10-CM

## 2024-12-10 DIAGNOSIS — Z923 Personal history of irradiation: Secondary | ICD-10-CM | POA: Diagnosis not present

## 2024-12-10 DIAGNOSIS — Z8572 Personal history of non-Hodgkin lymphomas: Secondary | ICD-10-CM | POA: Insufficient documentation

## 2024-12-10 DIAGNOSIS — Z9079 Acquired absence of other genital organ(s): Secondary | ICD-10-CM | POA: Insufficient documentation

## 2024-12-10 DIAGNOSIS — C50919 Malignant neoplasm of unspecified site of unspecified female breast: Secondary | ICD-10-CM | POA: Diagnosis not present

## 2024-12-10 NOTE — Progress Notes (Signed)
 Gynecologic Oncology Interval Visit   Referring Provider: Dr. Arloa  Chief Concern: Recurrent paget's disease of the vulva  Subjective:  Bailey Brooks is a 74 y.o. female, diagnosed with Paget's of the vulva, s/p WLE and vulvar biopsy 09/29/2015, who returns to clinic to re-evaluation.   She was seen on 05/07/24. No biopsies at that time. In interim, she has undergone hip replacement. She continues to follow with Dr Rennie for history of breast cancer. Mammograms are uptodate. Last colonoscopy 02/18/2021 with recommendation to repeat in 02/2026.   On 10/17/23 she underwent biopsy with Dr. Elby due to increased vulvar irritation.   1. Vulva, biopsy, Left :       -  SKIN WITH HYPERKERATOSIS. NEGATIVE FOR DYSPLASIA OR MALIGNANCY.   Vulvar irritation remains resolved. She denies complaints.    Gynecologic Oncology History: Bailey Brooks has a history of localized vulvar Paget's disease.   04/2013- vulvar biopsy revealed Paget's disease               WLE, additional margins resected for positive disease on frozen evaluation.   Part A: VULVA, VAGINAL MARGIN:  - POSITIVE FOR EXTRAMAMMARY PAGET'S DISEASE  Part B: VULVA, POSTERIOR MARGIN:  - POSITIVE FOR EXTRAMAMMARY PAGET'S DISEASE  Part C: VULVA, RIGHT, PARTIAL VULVECTOMY:  - EXTRAMAMMARY PAGET'S DISEASE  Part D: VULVA, NEW VAGINAL MARGIN:  - POSITIVE FOR EXTRAMAMMARY PAGET'S DISEASE  07/28/2015 for routine surveillance an area on the right vulva seemed more suspicious for recurrence. This was biopsied and confirmed recurrent Paget's disease.   She underwent repeat excision 09/29/15 A. VULVA; EXCISION:  - RARE CYTOKERATIN 7 POSITIVE INTRAEPIDERMAL CELLS, INTERPRETED AS RESIDUAL PAGET DISEASE.  - THE SURGICAL MARGINS ARE CLEAR.   B. VULVA, 11:00; BIOPSY:  - RARE CYTOKERATIN 7 POSITIVE INTRAEPIDERMAL CELLS, INTERPRETED AS PAGET DISEASE.   No additional issues since surgery.   She had vulvar biopsy on 05/09/17 which was  negative:   DIAGNOSIS:  A. VULVA; BIOPSY:  - SKIN WITH FOCAL KERATOSIS AND HYPERGRANULOSIS - NEGATIVE FOR DYSPLASIA AND MALIGNANCY  Of note, she has had a h/o recurrent vulvar candidiasis. Prior to her vulvar surgery 09/2015 she received 5 weekly doses of oral diflucan  for vulvar candidiasis. Postop she was treated for yeast infection again in 12/17 and then took one Diflucan  a week for six months with good results.   Seen in gyn-onc clinic on 01/16/18 by Dr. Elby. NED at that time.   She has swelling of her lower extremities which was evaluated by her PCP and thought to be related to amlodipine . She started lasix which has improved her symptoms. She was seen by Dr. Rennie for history of DLBCL without evidence of recurrence and was released to care of her PCP.   She saw Dr. Elby on 01/29/2019 with symptoms concerning for candidiasis at that time but no evidence of recurrent disease.   Her blood pressure medications were recently changes due to swelling secondary to amlodipine . No on lisinopril .   Previous biopsies were consistent with recurrent Paget's.   DIAGNOSIS:  A. VULVA, LEFT AT 3:00; PUNCH BIOPSY:  - BENIGN VULVAR TISSUE.  - NEGATIVE FOR DYSPLASIA AND MALIGNANCY.   DIAGNOSIS:  A. VULVA, RIGHT AT 7:00; PUNCH BIOPSY:  - EXTRAMAMMARY PAGET'S DISEASE OF THE VULVA.  - NEGATIVE FOR DYSPLASIA AND MALIGNANCY.   In the interim she has completed surgery and radiation for stage I right breast cancer, ER/PR positive HER-2/neu negative. She completed radiation on 12/29/2019. Anastrozole  was recommended and she plans to  start in approximately late February.   At last visit we had recommended start Aldara  but due to recent diagnosis of breast cancer at that time, patient requested to wait to start treatment and returns today to discuss options for management and consider starting Aldara . She is concerned about reaching site to be treated and tolerability. Her daughter-in-law is going to  help her with application. Previously she had expressed desire to avoid surgery/Mohs.   01/16/20 started Imiquimod   completed 5 weeks of treatment then held 02/18/2020 due to ulceration, erythema, and pain. Restarted on 03/17/2020. We recommended to continue imiquimod  for 16 weeks and add clobetasol , 0.05% cream to vulva on alternating days that she does not use imiquimod  to help with inflammation and reduce risk of recurrent ulcer/skin breakdown. Completed treatment on 08/14/2020.   08/14/20 She completed imiquimod  treatment. At last appointment, post imiquimod , she underwent biopsy x 2 which were negative for Paget's. We discussed maintenance imiquimod  vs monitoring. She elected close surveillance.   08/25/20 DIAGNOSIS:  A. VULVA, RIGHT; BIOPSY:  - BENIGN SQUAMOUS MUCOSA WITH MILD CHRONIC INFLAMMATION.  - NEGATIVE FOR DYSPLASIA AND MALIGNANCY.  - NEGATIVE FOR ACTIVE INFLAMMATION AND FUNGAL ELEMENTS.   B. VULVA, LEFT; BIOPSY:  - BENIGN SQUAMOUS MUCOSA WITH MILD CHRONIC INFLAMMATION.  - NEGATIVE FOR DYSPLASIA AND MALIGNANCY.  - NEGATIVE FOR ACTIVE INFLAMMATION AND FUNGAL ELEMENTS.   Comment:  The history of extramammary Paget's disease is noted. Immunohistochemical stain for CK7 (blocks A1 and B1) is negative.   Patient had vasovagal episode secondary to straining to have BM in September 2021 and was seen in ER. Concerned lidocaine  for biopsy could have contributed. She was seen in ER and had negative head ct. Left frontal ventriculoperitoneal shunt was stable on imaging.   10/17/23 she underwent biopsy due to increased vulvar irritation.   1. Vulva, biopsy, Left : SKIN WITH HYPERKERATOSIS. NEGATIVE FOR DYSPLASIA OR MALIGNANCY.   Problem List: Patient Active Problem List   Diagnosis Date Noted   Hematuria due to acute cystitis 07/01/2024   Low hemoglobin 07/01/2024   Status post total hip replacement, right 05/29/2024   Primary osteoarthritis of left hip 04/11/2024   Stage 2 chronic  kidney disease 03/05/2024   Osteoarthritis of hips (Bilateral) 12/13/2023   Morbid obesity with body mass index (BMI) of 40.0 to 44.9 in adult [facility]) 12/13/2023   Spondylosis without myelopathy or radiculopathy, lumbosacral region 12/04/2023   Decreased range of motion of hips (Bilateral) 12/04/2023   Abnormal MRI, lumbar spine (11/08/2023) 11/15/2023   DDD (degenerative disc disease), lumbosacral 11/15/2023   History of Allergy to iodine 11/15/2023    Class: History of   Anxiety about treatment 10/24/2023   Chronic pain syndrome 10/01/2023   Pharmacologic therapy 10/01/2023   Problems influencing health status 10/01/2023   Chronic groin pain  (2ry area of Pain) (Right) 10/01/2023   Spinal stenosis, lumbar region, with neurogenic claudication 10/01/2023   Chronic shoulder pain (5th area of Pain) (Bilateral) 10/01/2023   Albuminuria 08/24/2023   Nail dystrophy 08/24/2023   Rotator cuff arthropathy, left 11/17/2022   Status post reverse arthroplasty of shoulder, left 11/14/2022   Eye abnormality 10/30/2022   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 04/24/2022   Lumbar spondylosis 04/24/2022   TIA (transient ischemic attack) 04/02/2022   Rotator cuff tear 12/09/2021   Low serum vitamin B12 05/28/2020   Genetic testing 03/23/2020   Carcinoma of upper-outer quadrant of right breast in female, estrogen receptor positive (HCC) 10/28/2019  Prediabetes 12/20/2018   Macular degeneration 06/20/2018   GERD (gastroesophageal reflux disease) 06/20/2018   Hypersomnia 06/20/2018   Chronic knee pain (4th area of Pain) (Bilateral) (R>L) 06/20/2018   History of lymphoma 06/11/2017   Vulvar lesion 05/16/2017   Chronic low back pain (1ry area of Pain) (Bilateral) (R>L) w/o sciatica 04/03/2017   Chronic hip pain (3ry area of Pain) (Right) 08/03/2016   Essential hypertension 07/06/2016   Morbid obesity due to excess calories (HCC) 03/02/2016   Extramammary Paget  disease 07/28/2015   Adjustment disorder with mixed anxiety and depressed mood 05/25/2015   Paget disease, 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   Eczema 02/27/2013   Hypothyroidism 02/20/2012   HLD (hyperlipidemia) 02/20/2012    Past Medical History: Past Medical History:  Diagnosis Date   Adult pulmonary Langerhans cell histiocytosis (HCC)    Eosinophilic Granuloma of the Lung)   Allergic rhinitis    Antineoplastic chemotherapy induced anemia    Arthritis    Breast cancer, right (HCC) 09/17/2019   a.) Bx (+) stage 1 IMC (G1, ER/PR +, Her2/neu -); pT1a N0 M0; Tx'd with adjuvant XRT + endocrine therapy   Bruising 12/14/2017   Complication of anesthesia    pt was awake during intubation for colonoscopy   Diastolic dysfunction    a.) TTE 11/14/2016: EF 60-65%, no rwma, mild LVH, triv MR, mildly dil LA, nl RVSF, G1DD; b. 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   Hypercholesterolemia    Hypertension    Hypokalemia 02/10/2017   Hypomagnesemia 03/02/2016   Hypothyroidism    Long term current use of aromatase inhibitor    Morbid obesity (HCC)    Muscle cramps 11/13/2012   Ommaya reservoir present 2012   a.) placed for intrathecal chemotherapy; remains in place as of 11/2022   Paget's disease of vulva (HCC)  05/13/2013   a.) Bx (+) IPN of vulva consistent with Pagets; resected; b.) s/p re-excision 09/29/2015   Pre-diabetes    Stroke [facility]    Syncope    TIA (transient ischemic attack)    a.) s/p TIA 01/02/2011; b.) s/p TIA 02/10/2017; c.) s/p TIA 04/02/2022; d.) 02/2017 event monitor: No significant arrhythmias.   Tubular adenoma of colon     Past Surgical History: Past Surgical History:  Procedure Laterality Date   ABDOMINAL HYSTERECTOMY  1985   Hysterectomy-partial   BREAST BIOPSY Right 2002   Neg - AT [facility]   BREAST BIOPSY Right 2020   bx done at [facility]?, IDC and DCIS   BREAST LUMPECTOMY Right 10/16/2019   IDC and DCIS, negative LN   BURR HOLE W/ PLACEMENT OMMAYA RESERVOIR     BURR HOLE W/ PLACEMENT OMMAYA RESERVOIR  2012   COLONOSCOPY  04/2013  ESOPHAGOGASTRODUODENOSCOPY  04/2013   INJECTION KNEE  07/02/2018   INSERTION CENTRAL VENOUS ACCESS DEVICE W/ SUBCUTANEOUS PORT  2011   Port a Cath: Right chest Double Lumen, 04-Nov-2010   LIMBAL STEM CELL TRANSPLANT  2012   LIVER BIOPSY     stage 4B large Bcell lymphoma   PARTIAL MASTECTOMY WITH NEEDLE LOCALIZATION AND AXILLARY SENTINEL LYMPH NODE BX Right 10/16/2019   Procedure: PARTIAL MASTECTOMY WITH NEEDLE LOCALIZATION AND AXILLARY SENTINEL LYMPH NODE BX;  Surgeon: Tye Millet, DO;  Location: [facility] ORS;  Service: General;  Laterality: Right;   REVERSE SHOULDER ARTHROPLASTY Left 11/14/2022   Procedure: REVERSE SHOULDER ARTHROPLASTY WITH BICEPS TENODESIS.;  Surgeon: Edie Norleen PARAS, MD;  Location: [facility] OR[facility];  Service: Orthopedics;  Laterality: Left;   ROTATOR CUFF REPAIR Right 2016   SHOULDER ARTHROSCOPY Right 06/22/2015   Procedure: ARTHROSCOPY SHOULDER, parital repair of rotator cuff, biceps tenodesis, decompression and debridement;  Surgeon: Norleen PARAS Edie, MD;  Location: [facility] OR[facility];  Service: Orthopedics;  Laterality: Right;   TONSILLECTOMY  1954   TOTAL HIP ARTHROPLASTY Right 05/29/2024   Procedure: ARTHROPLASTY, HIP, TOTAL,POSTERIOR APPROACH;   Surgeon: Edie Norleen PARAS, MD;  Location: [facility] OR[facility];  Service: Orthopedics;  Laterality: Right;   VULVECTOMY     VULVECTOMY PARTIAL N/A 09/29/2015   Procedure: VULVECTOMY PARTIAL;  Surgeon: Webb Isidor Constable, MD;  Location: [facility] OR[facility];  Service: Gynecology;  Laterality: N/A;    Past Gynecologic History:  S/p hysterectomy  OB History:  OB History  No obstetric history on file.    Family History: Family History  Problem Relation Age of Onset   Diabetes Mother        died @ 64 of MI.   Hypertension Mother    Heart attack Mother    Melanoma Father 110       died of complications r/t melanoma w/ lung mets.   Kidney disease Sister        Kidney removed    Breast cancer Sister 2   Prostate cancer Brother        Prostate - dx in 6's   Heart disease Brother 67       reported MI @ age 51, ? treated w/ TPA->no recurrent CAD, now in 34's.   Hearing loss Maternal Aunt    Cancer Maternal Aunt        pancreatic vs colon cancer   Hearing loss Maternal Grandfather    Other Maternal Grandmother        flu pandemic   Kidney cancer Niece 32       partial nephrectomy    Social History: Social History   Socioeconomic History   Marital status: Married    Spouse name: Not on file   Number of children: 2   Years of education: Not on file   Highest education level: Bachelor's degree (e.g., BA, AB, BS)  Occupational History   Occupation: Pharmacologist Records, [employer]    Employer: [employer]  Tobacco Use   Smoking status: Never   Smokeless tobacco: Never  Vaping Use   Vaping status: Never Used  Substance and Sexual Activity   Alcohol use: No    Alcohol/week: 0.0 standard drinks of alcohol   Drug use: No   Sexual activity: Not Currently    Partners: Male    Birth control/protection: Post-menopausal  Other Topics Concern   Not on file  Social History Narrative   Lives in [place] with husband, has 2 grown sons.  No pets.  Retired  from [employer].  Activity limited  by chronic back pain-sedentary.   Right-handed   Caffeine: occasional caffeine free/diet soda or hot tea   Social Drivers of Health   Tobacco Use: Low Risk (12/10/2024)   Patient History    Smoking Tobacco Use: Never    Smokeless Tobacco Use: Never    Passive Exposure: Not on file  Financial Resource Strain: Low Risk  (09/04/2024)   [facility]   Overall Financial Resource Strain (CARDIA)    Difficulty of Paying Living Expenses: Not very hard  Food Insecurity: No Food Insecurity (09/04/2024)   Received from [facility]   Epic    Within the past 12 months, you worried that your food would run out before you got the money to buy more.: Never true    Within the past 12 months, the food you bought just didn't last and you didn't have money to get more.: Never true  Transportation Needs: No Transportation Needs (09/04/2024)   [facility]    In the past 12 months, has lack of transportation kept you from medical appointments or from getting medications?: No    Lack of Transportation (Non-Medical): No  Physical Activity: Inactive (08/11/2024)   Exercise Vital Sign    Days of Exercise per Week: 0 days    Minutes of Exercise per Session: 0 min  Stress: No Stress Concern Present (08/11/2024)   [employer] of Occupational [employer] - Occupational Stress Questionnaire    Feeling of Stress: Only a little  Social Connections: Moderately Integrated (08/11/2024)   Social Connection and Isolation Panel    Frequency of Communication with Friends and Family: More than three times a week    Frequency of Social Gatherings with Friends and Family: Once a week    Attends Religious Services: More than 4 times per year    Active Member of [employer] or Organizations: No    Attends Banker Meetings: Never    Marital Status: Married  Catering Manager Violence: Not At Risk (08/11/2024)   Epic     Fear of Current or Ex-Partner: No    Emotionally Abused: No    Physically Abused: No    Sexually Abused: No  Depression (PHQ2-9): Low Risk (12/10/2024)   Depression (PHQ2-9)    PHQ-2 Score: 0  Alcohol Screen: Low Risk (08/11/2024)   Alcohol Screen    Last Alcohol Screening Score (AUDIT): 0  Housing: Low Risk  (09/04/2024)   [facility]   Epic    In the last 12 months, was there a time when you were not able to pay the mortgage or rent on time?: No    In the past 12 months, how many times have you moved where you were living?: 0    At any time in the past 12 months, were you homeless or living in a shelter (including now)?: No  Utilities: Not At Risk (09/04/2024)   Received from [facility]   Epic    In the past 12 months has the electric, gas, oil, or water company threatened to shut off services in your home?: No  Health Literacy: Adequate Health Literacy (08/11/2024)   B1300 Health Literacy    Frequency of need for help with medical instructions: Never    Allergies: Allergies  Allergen Reactions   Morphine And Codeine Nausea And Vomiting and Nausea Only    Hallucinations  Sulfa Antibiotics Other (See Comments)    Dizzy/Fainting   Adhesive [Tape] Rash and Other (See Comments)    Including Bandaids-paper tape ok to use   Erythromycin Nausea And Vomiting   Oxycodone  Nausea And Vomiting    Current Medications: Current Outpatient Medications  Medication Sig Dispense Refill   acetaminophen  (TYLENOL ) 650 MG CR tablet Take 1,300 mg by mouth in the morning and at bedtime.     anastrozole  (ARIMIDEX ) 1 MG tablet TAKE 1 TABLET BY MOUTH DAILY 90 tablet 1   aspirin  81 MG chewable tablet Chew 81 mg by mouth daily.     atorvastatin  (LIPITOR ) 80 MG tablet TAKE 1 TABLET BY MOUTH DAILY 90 tablet 3   CALCIUM  PO Take 1 tablet by mouth daily.     cefUROXime  (CEFTIN ) 500 MG tablet      cholecalciferol  (VITAMIN D3) 25 MCG (1000 UT) tablet Take 1,000  Units by mouth daily.     diclofenac  Sodium (VOLTAREN ) 1 % GEL Apply 2 g topically in the morning and at bedtime.     ezetimibe  (ZETIA ) 10 MG tablet TAKE 1 TABLET BY MOUTH DAILY 90 tablet 3   hydrochlorothiazide  (HYDRODIURIL ) 12.5 MG tablet Take 1 tablet (12.5 mg total) by mouth daily. 90 tablet 3   ketoconazole  (NIZORAL ) 2 % cream Apply twice daily as directed. 60 g 2   lansoprazole  (PREVACID ) 30 MG capsule TAKE 1 CAPSULE BY MOUTH ONCE DAILY 90 capsule 1   levothyroxine  (SYNTHROID ) 88 MCG tablet TAKE 1 TABLET BY MOUTH ONCE DAILY. TAKE ON EMPTY STOMACH WITH A GLASS OF WATER AT LEAST 30-60 MINUTES BEFORE BREAKFAST 90 tablet 3   lisinopril  (ZESTRIL ) 40 MG tablet Take 1 tablet (40 mg total) by mouth daily. 90 tablet 3   mometasone  (ELOCON ) 0.1 % cream APPLY TO ITCHY SPOTS ON BODY 1-2 TIMES DAILY UNTIL IMPROVED; AVOID FACE, GROIN,AND UNDERARMS 45 g 1   Multiple Vitamin (MULTIVITAMIN) tablet Take 1 tablet by mouth daily.     Specialty Vitamins Products (ICAPS LUTEIN  & ZEAXANTHIN PO) Take 1 tablet by mouth every morning. Zeaxanthin 4mg /Lutein  10mg      tacrolimus  (PROTOPIC ) 0.1 % ointment Apply 1-2 times daily as needed for itch to affected areas. 60 g 2   No current facility-administered medications for this visit.   Review of Systems General:  no complaints Skin: no complaints Eyes: no complaints HEENT: no complaints Breasts: no complaints Pulmonary: no complaints Cardiac: no complaints Gastrointestinal: no complaints Genitourinary/Sexual: no complaints Ob/Gyn: no complaints Musculoskeletal: no complaints Hematology: no complaints Neurologic/Psych: no complaints    Objective:  Physical Examination:  BP 107/62 (BP Location: Left Arm, Patient Position: Sitting)   Pulse 84   Temp 100 F (37.8 C) (Tympanic)   Ht 5' 3 (1.6 m)   Wt 229 lb (103.9 kg)   SpO2 95%   BMI 40.57 kg/m    Body mass index is 40.57 kg/m.   GENERAL: Patient is a well appearing female in no acute  distress HEENT:  Atraumatic and normocephalic.  LUNGS: Normal respiratory effort ABDOMEN:  Soft, nontender. Nondistended.  LYMPH NODES: Negative for inguinal adenopathy EXTREMITIES:  Mild symmetrical peripheral edema.   NEURO:  Nonfocal. Well oriented.  Appropriate affect.  Pelvic: exam chaperoned by CMA EGBUS: Multiple vulvar scars and post surgical changes to anatomy. Well healed biopsy sites. Scattered areas of hypopigmentation and diffuse erythema/purplish of left labia and vaginal introitus. Stable appearing other than coloration which has slightly more purplish color, but that has been present in the past.  Right vulva increased erythema and tenderness, sharply demarcated and stable. Vagina: no discharge, lesions.  Cervix: surgically absent BME: limited d/t habitus. No apparent masses or nodularity RV: Not indicated     Dec 10, 2024       Previous exam:             Assessment:  Bailey Brooks is a 74 y.o. female diagnosed with recurrent Paget's disease of the vulva status post wide local excision with positive margins and subsequent recurrence in 07/2015.  Repeat wide local excision consistent with recurrent Paget's disease of the vulva status post wide local excision with negative margins.  Biopsy in 2018 was negative.  Biopsy in 2020 at 7:00 consistent with recurrent Paget's.  She deffered prior treatment due to breast cancer diagnosis and need for therapy. She has now completed treatment and she is ready to focus on management of Paget's disease.  01/16/20 started Imiquimod   completed 5 weeks of treatment then held 02/18/2020 due to ulceration, erythema, and pain. Restarted on 03/17/2020 and completed  08/14/2020. Negative biopsies 9/21. Opted for surveillance. Previous vulvar biopsies for worsening erythema were negative and vulvar symptoms have resolved. Today, she is clinically asymptomatic. Exam is overall stable.   Right breast cancer- stage Ia ER positive right breast  cancer - S/p lumpectomy and radiation, now on aromatase inhibitor. She is followed by medical oncology/Dr. Rennie and Dr. Lenn.   Low back pain and hip pain. MRI with pain management. Now s/p hip replacement.   Shoulder pain- s/p right arthroplasty and left shoulder replacement, pain improved     Plan:   Problem List Items Addressed This Visit       Musculoskeletal and Integument   Extramammary Paget disease - Primary   Relevant Medications   aspirin  81 MG chewable tablet   Continue observation. We previously discussed that if biopsies are positive for Paget's we can consider EUA, Mohs surgery vs simple vulvectomy. At this time no concerning symptoms or evidence of progression.   Right hip pain- s/p hip replacement on 08/27/24  She continues breast cancer surveillance with Dr Rennie  We will plan to see her back in 6 months for follow up or sooner if she develops worsening symptoms. Patient does request female gyn providers only.    The patient's diagnosis, an outline of the further diagnostic and laboratory studies which will be required, the recommendation for surgery, and alternatives were discussed with her and her accompanying family members.  All questions were answered to their satisfaction.   Tinnie Dawn, DNP, AGNP-C, AOCNP [facility] [phone] (clinic)  I personally had a face to face interaction and evaluated the patient jointly with the NP, Ms. Tinnie Dawn.  I have reviewed her history and available records and have performed the key portions of the physical exam including lymph node survey, abdominal exam, pelvic exam with my findings confirming those documented above by the APP.  I have discussed the case with the APP and the patient.  I agree with the above documentation, assessment and plan which was fully formulated by me.  Counseling was completed by me.   I personally saw the patient and performed a substantive portion of this  encounter in conjunction with the listed APP as documented above.  Allisen Pidgeon Isidor Constable, MD

## 2024-12-23 ENCOUNTER — Telehealth: Payer: Self-pay

## 2024-12-23 DIAGNOSIS — M48062 Spinal stenosis, lumbar region with neurogenic claudication: Secondary | ICD-10-CM

## 2024-12-23 DIAGNOSIS — M5416 Radiculopathy, lumbar region: Secondary | ICD-10-CM

## 2024-12-23 NOTE — Telephone Encounter (Signed)
 Patient with known history of lumbar radiculitis, lumbar stenosis, neurogenic claudication who is already established with Dr. Avanell for this.  Insurance needing new referral sent to him for ongoing evaluation and management:  1. Lumbar radiculitis (Primary) - Ambulatory referral to Physical Medicine Rehab  2. Spinal stenosis, lumbar region, with neurogenic claudication - Ambulatory referral to Physical Medicine Rehab  Luke Shade, MD

## 2025-01-05 ENCOUNTER — Ambulatory Visit

## 2025-01-06 ENCOUNTER — Other Ambulatory Visit: Payer: Self-pay

## 2025-01-06 DIAGNOSIS — E039 Hypothyroidism, unspecified: Secondary | ICD-10-CM

## 2025-01-06 MED ORDER — LEVOTHYROXINE SODIUM 88 MCG PO TABS: 88.0000 ug | ORAL_TABLET | Freq: Every day | ORAL | 3 refills | Status: AC

## 2025-01-07 ENCOUNTER — Ambulatory Visit

## 2025-01-07 ENCOUNTER — Ambulatory Visit: Payer: Self-pay

## 2025-01-07 VITALS — BP 130/70 | HR 69 | Temp 98.6°F | Ht 64.0 in | Wt 229.0 lb

## 2025-01-07 DIAGNOSIS — Z52091 Other blood donor, stem cells: Secondary | ICD-10-CM | POA: Insufficient documentation

## 2025-01-07 DIAGNOSIS — R7303 Prediabetes: Secondary | ICD-10-CM

## 2025-01-07 DIAGNOSIS — C4499 Other specified malignant neoplasm of skin, unspecified: Secondary | ICD-10-CM

## 2025-01-07 DIAGNOSIS — Z8673 Personal history of transient ischemic attack (TIA), and cerebral infarction without residual deficits: Secondary | ICD-10-CM

## 2025-01-07 DIAGNOSIS — Z6839 Body mass index (BMI) 39.0-39.9, adult: Secondary | ICD-10-CM | POA: Insufficient documentation

## 2025-01-07 DIAGNOSIS — E039 Hypothyroidism, unspecified: Secondary | ICD-10-CM

## 2025-01-07 DIAGNOSIS — E782 Mixed hyperlipidemia: Secondary | ICD-10-CM

## 2025-01-07 DIAGNOSIS — M47816 Spondylosis without myelopathy or radiculopathy, lumbar region: Secondary | ICD-10-CM

## 2025-01-07 DIAGNOSIS — I10 Essential (primary) hypertension: Secondary | ICD-10-CM

## 2025-01-07 LAB — TSH: TSH: 0.98 u[IU]/mL (ref 0.35–5.50)

## 2025-01-07 NOTE — Progress Notes (Signed)
 "  Established Patient Office Visit   Subjective  Patient ID: GEMINI BUNTE, female    DOB: 1951-08-09  Age: 74 y.o. MRN: [id number]  Chief Complaint  Patient presents with   Gastroesophageal Reflux   Hypothyroidism   Back Pain   Hypertension    Discussed the use of AI scribe software for clinical note transcription with the patient, who gave verbal consent to proceed.  History of Present Illness Bailey Brooks is a 74 year old female who presents for follow-up and medication review.  She experiences persistent back and joint pain, which limits her ability to walk long distances or exercise. She mentions a narrowing of a spinal space contributing to her back pain. Financial constraints prevent her from engaging in physical therapy, although she has previously attended for shoulder and hip issues. Her current medications for pain management include extra strength Tylenol  and diclofenac  gel as needed. She follows up with Dr. Avanell at [facility] for this.   She has a history of urinary tract infection treated with antibiotics in July, with no residual symptoms. 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 She reports that an ultrasound was performed at the urology clinic after she was unable to provide a urine sample. She sees ob/gyn every 6 monthly for follow up.   Her current medications include anastrozole , Lipitor , aspirin  81 mg, calcium , vitamin D , Zetia , hydrochlorothiazide , and lisinopril  40 mg daily. She takes most medications in the middle of the day, except for levothyroxine  in the morning and anastrozole  at night.  She underwent a stem cell transplant in 2012.    No chest pain, palpitations, or rapid heartbeats are reported, but she mentions a heart murmur.  She has lost about ten pounds, primarily by limiting sweets and eating two meals a day. Her  diet includes eggs, sandwiches, and balanced dinners with meat, vegetables, and carbohydrates. She reports sleeping well, typically from 11 PM to 7:30 AM, with one bathroom trip during the night.      ROS As per HPI    Objective:     BP 130/70   Pulse 69   Temp 98.6 F (37 C) (Oral)   Ht 5' 4 (1.626 m)   Wt 229 lb (103.9 kg)   SpO2 95%   BMI 39.31 kg/m      01/07/2025   10:34 AM 12/10/2024    9:40 AM 08/29/2024   10:25 AM  Depression screen PHQ 2/9  Decreased Interest 1 0 0  Down, Depressed, Hopeless 0 0 0  PHQ - 2 Score 1 0 0  Altered sleeping 0  0  Tired, decreased energy 1  2  Change in appetite 0  0  Feeling bad or failure about yourself  0  0  Trouble concentrating 0  0  Moving slowly or fidgety/restless 0  0  Suicidal thoughts 0  0  PHQ-9 Score 2  2   Difficult doing work/chores Not difficult at all       Data saved with a previous flowsheet row definition      01/07/2025   10:34 AM 07/01/2024   10:04 AM 03/05/2024   11:02 AM 12/28/2023   10:37 AM  GAD 7 : Generalized Anxiety Score  Nervous, Anxious, on Edge 1 1  1   0   Control/stop worrying 0 0  0  0   Worry too much -  different things 0 0  0  0   Trouble relaxing 0 1  1  0   Restless 0 0  0  0   Easily annoyed or irritable 1 0  1  0   Afraid - awful might happen 0 0  0  0   Total GAD 7 Score 2 2 3  0  Anxiety Difficulty Not difficult at all Not difficult at all Not difficult at all Not difficult at all     Data saved with a previous flowsheet row definition      01/07/2025   10:34 AM 12/10/2024    9:40 AM 08/29/2024   10:25 AM  Depression screen PHQ 2/9  Decreased Interest 1 0 0  Down, Depressed, Hopeless 0 0 0  PHQ - 2 Score 1 0 0  Altered sleeping 0  0  Tired, decreased energy 1  2  Change in appetite 0  0  Feeling bad or failure about yourself  0  0  Trouble concentrating 0  0  Moving slowly or fidgety/restless 0  0  Suicidal thoughts 0  0  PHQ-9 Score 2  2   Difficult doing work/chores Not  difficult at all       Data saved with a previous flowsheet row definition      01/07/2025   10:34 AM 07/01/2024   10:04 AM 03/05/2024   11:02 AM 12/28/2023   10:37 AM  GAD 7 : Generalized Anxiety Score  Nervous, Anxious, on Edge 1 1  1   0   Control/stop worrying 0 0  0  0   Worry too much - different things 0 0  0  0   Trouble relaxing 0 1  1  0   Restless 0 0  0  0   Easily annoyed or irritable 1 0  1  0   Afraid - awful might happen 0 0  0  0   Total GAD 7 Score 2 2 3  0  Anxiety Difficulty Not difficult at all Not difficult at all Not difficult at all Not difficult at all     Data saved with a previous flowsheet row definition   SDOH Screenings   Food Insecurity: No Food Insecurity (01/07/2025)  Housing: Low Risk (01/07/2025)  Transportation Needs: No Transportation Needs (01/07/2025)  Utilities: Not At Risk (09/04/2024)   Received from [facility]  Alcohol Screen: Low Risk (08/11/2024)  Depression (PHQ2-9): Low Risk (01/07/2025)  Financial Resource Strain: Patient Declined (01/07/2025)  Physical Activity: Insufficiently Active (01/07/2025)  Social Connections: Moderately Integrated (01/07/2025)  Stress: No Stress Concern Present (01/07/2025)  Tobacco Use: Low Risk (01/07/2025)  Health Literacy: Adequate Health Literacy (08/11/2024)     Physical Exam Constitutional:      General: She is not in acute distress.    Appearance: She is obese.  HENT:     Head: Normocephalic and atraumatic.     Mouth/Throat:     Mouth: Mucous membranes are moist.  Cardiovascular:     Rate and Rhythm: Normal rate.  Pulmonary:     Effort: Pulmonary effort is normal.     Breath sounds: Normal breath sounds.  Abdominal:     General: Abdomen is protuberant. Bowel sounds are normal.     Palpations: Abdomen is soft.  Musculoskeletal:     Cervical back: Neck supple. No rigidity.     Right lower leg: No edema.     Left lower leg: No edema.  Lymphadenopathy:  Cervical: No cervical adenopathy.   Skin:    General: Skin is warm.  Neurological:     Mental Status: She is alert and oriented to person, place, and time.     Gait: Gait abnormal.  Psychiatric:        Mood and Affect: Mood normal.        Behavior: Behavior normal.        No results found for any visits on 01/07/25.  The ASCVD Risk score (Arnett DK, et al., 2019) failed to calculate for the following reasons:   Risk score cannot be calculated because patient has a medical history suggesting prior/existing ASCVD   * - Cholesterol units were assumed     Assessment & Plan:  Patient is a pleasant 74 year old female presenting for medication management.  Assessment & Plan Acquired hypothyroidism Chronic, previous TSH within normal limit, no new concerns. Check TSH today.  Continue Synthroid  88 mcg daily. Orders:   TSH  BMI 39.0-39.9,adult BMI decreased from over 40 to 39.31. Weight loss due to dietary changes. Continue dietary modifications. Encouraged gradual increase in physical activity as tolerated.    Essential hypertension - BP above goal on arrival. Repeat BP 130/70 mmHg. Home readings normal.  - On hydrochlorothiazide  12.5 mg and lisinopril  40 mg. She has not taken her medications this morning which can contribute to elevated BP readings. Continue current BP medications. Check blood pressure at home 2-3 times a week and log. Follow up with cardiology in February. Provided information on DASH diet.     Mixed hyperlipidemia Continue Atorvastatin  80 mg, zetia  10 mg daily. Last cholesterol check in July, LDL within goal.     Prediabetes A1c was 5.4% on 12/18/24 with medicare home health visit. Reviewed.     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 Continue.    History of TIA (transient ischemic attack) Continue aspirin  81 mg, atorvastatin  80 mg and zetia  10 mg daily.     Lumbar spondylosis She sees Dr. Avanell at [facility] for this. Continue follow up with  him. Declined referral to physical therapy to work on balance, back pain (due to insurance, finances). Recommend reaching out to our clinic if she changes her mind about this.     Return in about 6 months (around 07/07/2025) for Chronic disease follow up .   Luke Shade, MD "

## 2025-01-07 NOTE — Assessment & Plan Note (Signed)
 Continue aspirin  81 mg, atorvastatin  80 mg and zetia  10 mg daily.

## 2025-01-07 NOTE — Assessment & Plan Note (Signed)
 BMI decreased from over 40 to 39.31. Weight loss due to dietary changes. Continue dietary modifications. Encouraged gradual increase in physical activity as tolerated.

## 2025-01-07 NOTE — Assessment & Plan Note (Signed)
 A1c was 5.4% on 12/18/24 with medicare home health visit. Reviewed.

## 2025-01-07 NOTE — Assessment & Plan Note (Addendum)
 Chronic, previous TSH within normal limit, no new concerns. Check TSH today.  Continue Synthroid  88 mcg daily. Orders:   TSH

## 2025-01-07 NOTE — Assessment & Plan Note (Signed)
 She sees Dr. Avanell at [place] [facility] for this. Continue follow up with him. Declined referral to physical therapy to work on balance, back pain (due to insurance, finances). Recommend reaching out to our clinic if she changes her mind about this.

## 2025-01-07 NOTE — Assessment & Plan Note (Signed)
-   BP above goal on arrival. Repeat BP 130/70 mmHg. Home readings normal.  - On hydrochlorothiazide  12.5 mg and lisinopril  40 mg. She has not taken her medications this morning which can contribute to elevated BP readings. Continue current BP medications. Check blood pressure at home 2-3 times a week and log. Follow up with cardiology in February. Provided information on DASH diet.

## 2025-01-07 NOTE — Assessment & Plan Note (Signed)
 Under surveillance by OB GYN every 6 monthly. Continue.

## 2025-01-07 NOTE — Assessment & Plan Note (Signed)
 Continue Atorvastatin  80 mg, zetia  10 mg daily. Last cholesterol check in July, LDL within goal.

## 2025-01-07 NOTE — Assessment & Plan Note (Deleted)
 SABRA

## 2025-01-07 NOTE — Patient Instructions (Signed)
 Diet: Emphasize whole grains, lean proteins, fruits, and vegetables. Limit processed foods and sugary drinks. Add DASH diet to help with blood pressure.  Exercise: Aim for 150 minutes of moderate aerobic activity weekly plus strength training twice a week. Please work on international aid/development worker, start with 2-3 minutes a day then increase exercise tolerance as tolerated and safe. If you change your mind about physical therapy please reach out to us .

## 2025-01-22 ENCOUNTER — Ambulatory Visit: Admitting: Podiatry

## 2025-02-12 ENCOUNTER — Other Ambulatory Visit

## 2025-02-12 ENCOUNTER — Encounter

## 2025-02-27 ENCOUNTER — Ambulatory Visit: Admitting: Internal Medicine

## 2025-06-10 ENCOUNTER — Inpatient Hospital Stay

## 2025-07-09 ENCOUNTER — Ambulatory Visit

## 2025-08-14 ENCOUNTER — Ambulatory Visit

## 2025-10-26 ENCOUNTER — Encounter: Admitting: Dermatology
